# Patient Record
Sex: Male | Born: 1945
Health system: Southern US, Community
[De-identification: ages and names within clinical notes are randomized; demographics above are authoritative.]

## PROBLEM LIST (undated history)

## (undated) DIAGNOSIS — E119 Type 2 diabetes mellitus without complications: Secondary | ICD-10-CM

## (undated) DIAGNOSIS — N6310 Unspecified lump in the right breast, unspecified quadrant: Secondary | ICD-10-CM

## (undated) DIAGNOSIS — C499 Malignant neoplasm of connective and soft tissue, unspecified: Secondary | ICD-10-CM

## (undated) DIAGNOSIS — R918 Other nonspecific abnormal finding of lung field: Secondary | ICD-10-CM

## (undated) DIAGNOSIS — I2699 Other pulmonary embolism without acute cor pulmonale: Secondary | ICD-10-CM

## (undated) DIAGNOSIS — E278 Other specified disorders of adrenal gland: Secondary | ICD-10-CM

## (undated) DIAGNOSIS — I10 Essential (primary) hypertension: Secondary | ICD-10-CM

## (undated) DIAGNOSIS — C3411 Malignant neoplasm of upper lobe, right bronchus or lung: Secondary | ICD-10-CM

## (undated) HISTORY — DX: Malignant neoplasm of upper lobe, right bronchus or lung: C34.11

## (undated) HISTORY — DX: Other pulmonary embolism without acute cor pulmonale: I26.99

## (undated) HISTORY — DX: Type 2 diabetes mellitus without complications: E11.9

## (undated) HISTORY — DX: Malignant neoplasm of connective and soft tissue, unspecified: C49.9

---

## 2003-10-01 ENCOUNTER — Inpatient Hospital Stay (HOSPITAL_COMMUNITY): Admission: EM | Admit: 2003-10-01 | Discharge: 2003-11-01 | Payer: Self-pay | Admitting: Emergency Medicine

## 2003-10-04 ENCOUNTER — Encounter: Payer: Self-pay | Admitting: Critical Care Medicine

## 2003-10-07 ENCOUNTER — Encounter: Payer: Self-pay | Admitting: Cardiology

## 2009-05-04 ENCOUNTER — Ambulatory Visit (HOSPITAL_COMMUNITY): Admission: RE | Admit: 2009-05-04 | Discharge: 2009-05-04 | Payer: Self-pay | Admitting: Internal Medicine

## 2010-09-21 NOTE — H&P (Signed)
Darrell Christensen, Darrell Christensen                           ACCOUNT NO.:  [id number]   MEDICAL RECORD NO.:  [id number]                   PATIENT TYPE:  INP   LOCATION:  2106                                 FACILITY:  [facility]   PHYSICIAN:  Shan Levans, M.D. [facility]            DATE OF BIRTH:  Sep 12, 1945   DATE OF ADMISSION:  10/03/2003  DATE OF DISCHARGE:                                HISTORY & PHYSICAL   CHIEF COMPLAINT:  Altered mental status.   HISTORY OF PRESENT ILLNESS:  This 65 year old white male admitted to [facility] on Oct 01, 2003, with complaints of grand mal seizure after  recent binge drinking, associated seizure disorder, had stopped his seizure  medications and then began binge drinking alcohol as the alcohol level fell.  He began developing shaking, jerking, and then gran mal seizure. He also had  an associated sodium of 110 on admission. He has been slow to wake up over  the past 24 hours and transferred down to [facility] for potential  neurologic evaluation per family request. Upon arrival, however, to our ICU  he was already waking up, moving all fours, and following commands. MRI  earlier today was normal except for motion artifact.   PAST MEDICAL HISTORY:  1. Extensive ethanol abuse.  2. History of seizure disorder.  3. Hypertension.   ALLERGIES:  None.   MEDICATIONS:  Prior to admission include Benicar. Medications currently are  Dilantin 300 mg daily, Protonix IV, thiamine IM, and Cardizem CD 240 mg  daily.   ALLERGIES:  None.   SOCIAL HISTORY:  Noncontributory.   FAMILY HISTORY/REVIEW OF SYSTEMS:  Noncontributory. Medical--previous  history of hypertension and sleep disorder along with seizure disorder.   PHYSICAL EXAMINATION:  GENERAL: This is a semiconscious white male in no  acute distress. This is a slightly agitated white male in no acute distress.  He will follow commands.  VITAL SIGNS: Pulse 106, sinus tachycardia, blood pressure 136/71,  saturation  94% on nasal cannula.  CHEST: Clear. Sonous respirations.  CARDIAC: Resting tachycardia without S3. Normal S1 and S2.  ABDOMEN: Soft, nontender. Bowel sounds active.  EXTREMITIES: No edema or clubbing.  SKIN: Clear.  HEENT: Dry oral mucous membranes and oral secretions seen. Nares clear.  NECK: Supple. No thyromegaly.  NEUROLOGIC: Moves all fours. Will follow commands. Is lethargic. No deficits  seen.  MUSCULOSKELETAL: Decreased range of motion in left shoulder and tender to  palpation.  SKIN: Clear.   LABORATORY DATA:  Chest x-ray showed no active disease. Sodium 127,  potassium 3.5, chloride 96, CO2 23, BUN 3, creatinine 0.8, blood sugar 113,  SGOT 30, SGPT 25, alkaline phosphatase 70, total bilirubin 1.2. BNP 62.  Ammonia level less than 8.  Dilantin level 9.9.   MRI was negative except for motion artifact.   IMPRESSION/RECOMMENDATION:  1. Alcoholic encephalopathy with recent seizure disorder exacerbated by     discontinuance  of seizure medications and falling alcohol levels after     binge drinking, exacerbated by hyponatremia. For this will continue IV     fluids with normal saline and potassium supplementation. Follow     electrolytes, magnesium, and phosphorus. Watch for withdrawal symptoms     from DTs. Give p.r.n. Ativan if needed.  2. Seizure disorder. Will continue Dilantin therapy. Hold off on neurologic     consultation as of now unless the patient has a worsening of his status.  3. Prophylactic therapy includes Lovenox and  proton pump inhibitor.  4. Dislocated left shoulder. Will seek an orthopedic consult.   At this point the patient will be monitored overnight in the intensive care  unit. Plan to transfer to a regular room in the a.m. if stable and consider  a hospitalist consultation for the remainder of this portion of the  hospitalization.                                                Shan Levans, M.D. [facility]    PW/MEDQ  D:  10/03/2003  T:   10/03/2003  Job:  [id number]   cc:   Patrica Duel, M.D.  [address], [address]  [place]  [place] [address]  Fax: [phone]   Kirk Ruths, M.D.  [address]  [place]  [place] [address]  Fax: [phone]

## 2010-09-21 NOTE — H&P (Signed)
NAMEOVERTON, BOGGUS                           ACCOUNT NO.:  [id number]   MEDICAL RECORD NO.:  [id number]                   PATIENT TYPE:  INP   LOCATION:  IC10                                 FACILITY:  [facility]   PHYSICIAN:  Kirk Ruths, M.D.            DATE OF BIRTH:  1945-11-01   DATE OF ADMISSION:  10/01/2003  DATE OF DISCHARGE:                                HISTORY & PHYSICAL   CHIEF COMPLAINT:  Shaking all over.   HISTORY OF PRESENT ILLNESS:  This is a 65 year old white male with a  longstanding history of alcohol use.  Observers state the patient has had  some vomiting over the last day or two and was observed having a grand mal  seizure.  In the emergency room he was also observed to have a grand mal  seizure and was treated with IV Dilantin and Valium.  On workup in the  emergency room the patient was found to have a sodium of 110 and an alcohol  level of 0.64.  It is felt the patient's seizure activity is more related to  hyponatremia rather than alcohol withdrawal.  The patient is admitted to the  ICU for Dilantin and replenishment of his electrolytes, close observation  and anticipation of DTs.   ALLERGIES:  The patient has no known allergies.   MEDICATIONS:  He currently takes Benicar for hypertension.   PAST MEDICAL HISTORY:  There is a remote history of seizures, alcohol abuse  and sleep apnea.   REVIEW OF SYSTEMS:  Unable to obtain due to the patient's obtunded condition  after Valium.   PHYSICAL EXAMINATION:  VITAL SIGNS:  He is afebrile, blood pressure 150/80,  pulse 110 and regular, respirations 26 and unlabored.  GENERAL:  A robust-appearing white male who is obtunded secondary to  medications.  HEENT:  Pupils equal to light and accommodation.  Oropharynx is benign.  TMs  are normal.  NECK:  Supple without JVD, bruits or thyromegaly.  LUNGS:  Clear in all areas.  HEART:  Regular sinus rhythm __________ .  ABDOMEN:  Soft and nontender.  EXTREMITIES:   Without clubbing, cyanosis or edema.  NEUROLOGIC:  Grossly intact.   ASSESSMENT:  1. Grand mal seizure, probably secondary to hyponatremia.  2. Hyponatremia with a sodium of 110.  3. History of alcohol abuse and alcohol-related seizures in the past.  4. Hypertension.     ___________________________________________                                         Kirk Ruths, M.D.   WMM/MEDQ  D:  10/02/2003  T:  10/02/2003  Job:  [id number]

## 2010-09-21 NOTE — Discharge Summary (Signed)
NAMEDAVIDSON, PALMIERI                           ACCOUNT NO.:  [id number]   MEDICAL RECORD NO.:  [id number]                   PATIENT TYPE:  INP   LOCATION:  5501                                 FACILITY:  [facility]   PHYSICIAN:  Marcelyn Bruins, M.D. [facility]              DATE OF BIRTH:  Jun 08, 1945   DATE OF ADMISSION:  10/03/2003  DATE OF DISCHARGE:  10/31/2003                                 DISCHARGE SUMMARY   DISCHARGE DIAGNOSES:  1. Ventilator-dependent respiratory failure.  2. Acute renal failure.  3. Left shoulder dislocation.  4. Alcoholic encephalopathy.  5. Depression.   HISTORY OF PRESENT ILLNESS:  Mr. Darrell Christensen is a 65 year old, white male  admitted to [facility] on Oct 01, 2003, with complaints of grand  mal seizure after recent binge drinking associated with seizure disorder.  He had stopped his seizure medication and began binge drinking alcohol. He  developed shaking, jerking and then a grand mal seizure.  He had associated  sodium of 110 on admission.  He has been slow to wake up over 24 hours and  was transferred to [facility] for potential neurological evaluation  per family request.  However, on arrival to the ICU, he was already awake  following commands.  MRI earlier today showed normal except for a motion  artifact.   PAST MEDICAL HISTORY:  1. Extensive ethanol abuse.  2. History of seizure disorder.  3. Hypertension.   ALLERGIES:  No known drug allergies.   MEDICATIONS:  Benicar, Dilantin, Protonix, thiamine and Cardizem.   LABORATORY DATA AND X-RAY FINDINGS:  Magnesium 2.5.  PTT 34.  FIO2 on 30%  CPAP with PEEP of 5, pH 7.35, pCO2 41, pO2 112.  WBC 18.4, hemoglobin 9.0,  hematocrit 26.2, platelets 899.  Sodium 131, potassium 4.3, chloride 96, CO2  28, glucose 102, BUN 88, creatinine reached a peak of 7.7.  On June 25,  creatinine was 5.0.  Phenytoin level 4.5.  Blood cultures showed no growth.  Urine culture showed 100,000 colonies of E.  coli.  Quantitative BAL  demonstrated Staphylococcus aureus, methicillin-sensitive.  C. difficile  negative.   Chest x-ray shows low volume with bibasilar atelectasis with no significant  CHF.  A 2D echocardiogram performed on June 3, demonstrates EF of 55-65%,  mild mitral annular calcification, left atrial size was upper limits of  normal, mild right ventricular hypertrophy, inferior cava was mildly  dilated.  A 12-lead EKG shows normal sinus rhythm with ventricular rate of  99 beats per minute.   PROCEDURES:  1. Oral tracheal intubation from October 06, 2003, through October 20, 2003.  2. Left subclavian central venous catheterization from October 06, 2003, through     October 19, 2003.  3. Right radial arterial line from October 06, 2003, through October 18, 2003.  4. Right internal jugular hemodialysis catheter from October 08, 2003, through     October 30, 2003.   HOSPITAL COURSE:  Problem 1.  VENTILATOR-DEPENDENT RESPIRATORY FAILURE:  He  required oral tracheal intubation on October 06, 2003, and remained intubated to  October 20, 2003.  This was most likely secondary ETOH abuse withdrawal and  seizure disorder.  He did have methicillin-sensitive Staphylococcus tracheal  bronchitis and this was treated with appropriate antibiotics.  He does have  underlying chronic obstructive pulmonary disease and has continued tobacco  and alcohol abuse.  Despite all the problems, he continues to smoke  postextubation.   Problem 2.  ACUTE RENAL FAILURE:  His creatinine was elevated after  admission.  He required a left internal jugular hemodialysis catheter from  June 4, through June 26.  This has been removed and renal failure slowly  resolving.   Problem 3.  LEFT SHOULDER DISLOCATION MOST LIKELY SECONDARY TO FALL:  He was  seen by the orthopedic service, Dr. Jerl Santos, this time and treated with a  sling and symptomatic pain control.   Problem 4.  ALCOHOLIC ENCEPHALOPATHY:  He was evaluated by Dr. Jeanie Sewer  with  recommendation for treatment of depression.   DISCHARGE MEDICATIONS:  1. Protonix 40 mg a day.  2. Thiamine 100 mg a day.  3. Folic acid 1 mg daily.  4. Seroquel 50 mg q.h.s.  5. Ferrous sulfate 300 mg t.i.d.  6. Clonidine 0.1 mg b.i.d.  7. Lasix 80 mg q.d.  8. Benadryl 25 mg q.h.s.  9. Xanax 0.5 mg b.i.d.  10.      Vicodin one to two tablets every six hours p.r.n. pain.   DIET:  As tolerated.   DISPOSITION/CONDITION ON DISCHARGE:  Due to his altered mental status from  chronic ETOH abuse, he is being transferred to nursing home in the interim  before he is ready for discharge home.      Brett Canales Minor, A.C.N.P. [facility]                 Marcelyn Bruins, M.D. [facility]    SM/MEDQ  D:  10/31/2003  T:  10/31/2003  Job:  [id number]   cc:   Antonietta Breach, M.D.  [address]  [place], [place] [address]  Fax: [phone]   Wilber Bihari. Caryn Section, M.D.  [address]  [place]  [place] [address]  Fax: [phone]   Lubertha Basque. Jerl Santos, M.D.  [address]  [place]  [place] [address]  Fax: [phone]

## 2010-09-21 NOTE — Op Note (Signed)
Darrell Christensen, Darrell Christensen                           ACCOUNT NO.:  [id number]   MEDICAL RECORD NO.:  [id number]                   PATIENT TYPE:  INP   LOCATION:                                       FACILITY:  [facility]   PHYSICIAN:  Larina Earthly, M.D.                 DATE OF BIRTH:  [birth year]   DATE OF PROCEDURE:  10/21/2003  DATE OF DISCHARGE:                                 OPERATIVE REPORT   PREOPERATIVE DIAGNOSIS:  End-stage renal disease   POSTOPERATIVE DIAGNOSIS:  End-stage renal disease.   PROCEDURE:  Left internal jugular Diatek catheter.   SURGEON:  Larina Earthly, M.D.   ASSISTANT:  Nurse.   ANESTHESIA:  MAC.   COMPLICATIONS:  None.   DISPOSITION:  To recovery room stable with chest x-ray pending.   PROCEDURE IN DETAIL:  The patient was taken to the operating room and placed  on the table.  The right and left neck were imaged with ultrasound.  The  patient had a patent left IJ, and the right IJ appeared to be patent but  there looked like there may be some thrombus in the internal jugular vein.  The patient was placed in Trendelenburg position and the right and left neck  and chest were prepped and draped in usual sterile fashion.  Using local  anesthesia and a finder needle, the left internal jugular vein was easily  entered and, using the Seldinger technique, a guidewire was passed down to  the level of the right atrium.  The dilator and peel-away sheath were passed  over the guidewire and the dilator and guidewire were removed.  The Diatek  catheter 32 cm was passed down to the level of the right atrium.  The peel-  away sheath was removed.  The catheter was brought to a subcutaneous tunnel  through a separate stab incision.  The catheter was secured to the skin with  a 3-0 nylon stitch and the entry site was closed with 4-0 subcuticular  Vicryl stitch.  A 2-lumen port was attached to the catheter and both lumens  flushed and aspirated easily and were locked with heparin  1000 units per mL.  The catheter was imaged in its entirety and was found to be without any  kinking at the exit site.  Sterile dressing was applied and the patient was  taken to the recovery room in stable condition.                                               Larina Earthly, M.D.    TFE/MEDQ  D:  10/21/2003  T:  10/23/2003  Job:  [id number]

## 2010-09-21 NOTE — Discharge Summary (Signed)
[name], Darrell Christensen                           ACCOUNT NO.:  [id number]   MEDICAL RECORD NO.:  [id number]                   PATIENT TYPE:  INP   LOCATION:  IC10                                 FACILITY:  [facility]   PHYSICIAN:  Kirk Ruths, M.D.            DATE OF BIRTH:  [birth year]   DATE OF ADMISSION:  10/01/2003  DATE OF DISCHARGE:  10/03/2003                                 DISCHARGE SUMMARY   DISCHARGE DIAGNOSES:  1. Seizure.  2. Extreme hyponatremia.  3. Alcohol abuse.  4. Dislocated left shoulder.  5. Hypokalemia.   HOSPITAL COURSE:  This is a 65 year old white male with a history of alcohol  abuse who was admitted through the emergency room after falling and having a  grand mal seizure.  Observers of the patient state he had been vomiting over  the last two days, and subsequently had fallen, and went through mental  status changes.  The patient has had a remote history of seizures, alcohol  abuse and sleep apnea in the past.   LABORATORY DATA:  In the emergency room, he was found to have sodium of 110,  potassium of 310.  It was originally felt these seizures were related to his  extreme hyponatremia.  The patient's alcohol level at the time of admission  was 0.64 so it was felt it was probably not alcoholic withdrawal seizure  since he had alcohol in his blood.  The patient's cardiac enzymes showed a  myoglobin of greater than 500 with MB of 5.9, and troponin negligible.  Myoglobin was felt to be up due to the patient's fall.  His urinalysis was  negative.  He had a BNP this morning of 62.  His Dilantin level the  following day after IV drip was 9.9 near adequate level, therapeutic level.  Initial white count was 13,900, hemoglobin 16.3.  He did have an elevated D-  Dimer at the time of admission of 1.33.  He had a normal chest x-ray.  The  patient's sodium and potassium were gingerly replaced, sodium the second  hospital day being 121, with potassium up to normal at  3.6.  BUN and  creatinine were normal.   The patient continued to be difficult to arouse.  We were awaiting a  neurology consultation, but neurologist was not available at the time.  The  patient was arousable, but still somewhat sluggish.  With the sodium  approaching normal, he should have been more alert.  He was seen in  consultation by orthopedics for his dislocated shoulder.  The patient during  his stay was closely observed in the intensive care unit.  The patient  underwent an MRI of the brain which showed no acute lesions, although it was  poor quality due to the patient's movement.  The patient's vital signs  remained stable throughout his stay.  Blood gasses were adequate although  not excellent with a pH  of 7.41, pCO2 of 34, and an O2 of __________on two  liters.  The patient's family requested the patient be transferred, and we  were basically in agreement with the transfer since we had no other  treatments with the findings we had and un-availableness of neurology.  He  was transferred to by [facility] in stable condition to the service of Dr.  Delford Field, pulmonary intensive care doctor.    ___________________________________________                                         Kirk Ruths, M.D.   WMM/MEDQ  D:  10/18/2003  T:  10/18/2003  Job:  [id number]

## 2011-05-08 ENCOUNTER — Ambulatory Visit (HOSPITAL_COMMUNITY)
Admission: RE | Admit: 2011-05-08 | Discharge: 2011-05-08 | Disposition: A | Discharge status: Home / Self Care | Payer: Medicare Other | Source: Ambulatory Visit | Attending: Internal Medicine | Admitting: Internal Medicine

## 2011-05-08 ENCOUNTER — Other Ambulatory Visit (HOSPITAL_COMMUNITY): Payer: Self-pay | Admitting: Internal Medicine

## 2011-05-08 DIAGNOSIS — F17200 Nicotine dependence, unspecified, uncomplicated: Secondary | ICD-10-CM

## 2011-05-08 DIAGNOSIS — R05 Cough: Secondary | ICD-10-CM | POA: Insufficient documentation

## 2011-05-08 DIAGNOSIS — R059 Cough, unspecified: Secondary | ICD-10-CM | POA: Insufficient documentation

## 2016-02-27 DIAGNOSIS — E1165 Type 2 diabetes mellitus with hyperglycemia: Secondary | ICD-10-CM | POA: Diagnosis not present

## 2016-02-27 DIAGNOSIS — I10 Essential (primary) hypertension: Secondary | ICD-10-CM | POA: Diagnosis not present

## 2016-03-01 DIAGNOSIS — Z6833 Body mass index (BMI) 33.0-33.9, adult: Secondary | ICD-10-CM | POA: Diagnosis not present

## 2016-03-01 DIAGNOSIS — F411 Generalized anxiety disorder: Secondary | ICD-10-CM | POA: Diagnosis not present

## 2016-03-01 DIAGNOSIS — R944 Abnormal results of kidney function studies: Secondary | ICD-10-CM | POA: Diagnosis not present

## 2016-03-01 DIAGNOSIS — D72829 Elevated white blood cell count, unspecified: Secondary | ICD-10-CM | POA: Diagnosis not present

## 2016-03-01 DIAGNOSIS — Z720 Tobacco use: Secondary | ICD-10-CM | POA: Diagnosis not present

## 2016-03-01 DIAGNOSIS — E1165 Type 2 diabetes mellitus with hyperglycemia: Secondary | ICD-10-CM | POA: Diagnosis not present

## 2016-03-01 DIAGNOSIS — I10 Essential (primary) hypertension: Secondary | ICD-10-CM | POA: Diagnosis not present

## 2016-03-01 DIAGNOSIS — E782 Mixed hyperlipidemia: Secondary | ICD-10-CM | POA: Diagnosis not present

## 2016-03-01 DIAGNOSIS — R601 Generalized edema: Secondary | ICD-10-CM | POA: Diagnosis not present

## 2016-03-31 ENCOUNTER — Emergency Department (HOSPITAL_COMMUNITY): Payer: Medicare Other

## 2016-03-31 ENCOUNTER — Encounter (HOSPITAL_COMMUNITY): Payer: Self-pay

## 2016-03-31 ENCOUNTER — Inpatient Hospital Stay (HOSPITAL_COMMUNITY)
Admission: EM | Admit: 2016-03-31 | Discharge: 2016-04-06 | DRG: 175 | Disposition: A | Discharge status: Home / Self Care | Payer: Medicare Other | Attending: Oncology | Admitting: Oncology

## 2016-03-31 DIAGNOSIS — I10 Essential (primary) hypertension: Secondary | ICD-10-CM | POA: Diagnosis not present

## 2016-03-31 DIAGNOSIS — Z85820 Personal history of malignant melanoma of skin: Secondary | ICD-10-CM

## 2016-03-31 DIAGNOSIS — J984 Other disorders of lung: Secondary | ICD-10-CM

## 2016-03-31 DIAGNOSIS — Z6833 Body mass index (BMI) 33.0-33.9, adult: Secondary | ICD-10-CM | POA: Diagnosis not present

## 2016-03-31 DIAGNOSIS — I2699 Other pulmonary embolism without acute cor pulmonale: Secondary | ICD-10-CM

## 2016-03-31 DIAGNOSIS — K760 Fatty (change of) liver, not elsewhere classified: Secondary | ICD-10-CM | POA: Diagnosis present

## 2016-03-31 DIAGNOSIS — N6310 Unspecified lump in the right breast, unspecified quadrant: Secondary | ICD-10-CM | POA: Diagnosis present

## 2016-03-31 DIAGNOSIS — E279 Disorder of adrenal gland, unspecified: Secondary | ICD-10-CM | POA: Diagnosis not present

## 2016-03-31 DIAGNOSIS — Z7984 Long term (current) use of oral hypoglycemic drugs: Secondary | ICD-10-CM

## 2016-03-31 DIAGNOSIS — E278 Other specified disorders of adrenal gland: Secondary | ICD-10-CM | POA: Diagnosis present

## 2016-03-31 DIAGNOSIS — F419 Anxiety disorder, unspecified: Secondary | ICD-10-CM | POA: Diagnosis present

## 2016-03-31 DIAGNOSIS — C499 Malignant neoplasm of connective and soft tissue, unspecified: Secondary | ICD-10-CM

## 2016-03-31 DIAGNOSIS — N63 Unspecified lump in unspecified breast: Secondary | ICD-10-CM

## 2016-03-31 DIAGNOSIS — J188 Other pneumonia, unspecified organism: Secondary | ICD-10-CM | POA: Diagnosis not present

## 2016-03-31 DIAGNOSIS — F17290 Nicotine dependence, other tobacco product, uncomplicated: Secondary | ICD-10-CM | POA: Diagnosis present

## 2016-03-31 DIAGNOSIS — J95811 Postprocedural pneumothorax: Secondary | ICD-10-CM

## 2016-03-31 DIAGNOSIS — C3411 Malignant neoplasm of upper lobe, right bronchus or lung: Secondary | ICD-10-CM | POA: Diagnosis present

## 2016-03-31 DIAGNOSIS — M8580 Other specified disorders of bone density and structure, unspecified site: Secondary | ICD-10-CM | POA: Diagnosis present

## 2016-03-31 DIAGNOSIS — R911 Solitary pulmonary nodule: Secondary | ICD-10-CM | POA: Diagnosis not present

## 2016-03-31 DIAGNOSIS — R918 Other nonspecific abnormal finding of lung field: Secondary | ICD-10-CM

## 2016-03-31 DIAGNOSIS — D492 Neoplasm of unspecified behavior of bone, soft tissue, and skin: Secondary | ICD-10-CM | POA: Diagnosis not present

## 2016-03-31 DIAGNOSIS — Z87891 Personal history of nicotine dependence: Secondary | ICD-10-CM | POA: Diagnosis not present

## 2016-03-31 DIAGNOSIS — R079 Chest pain, unspecified: Secondary | ICD-10-CM | POA: Diagnosis not present

## 2016-03-31 DIAGNOSIS — R222 Localized swelling, mass and lump, trunk: Secondary | ICD-10-CM | POA: Diagnosis not present

## 2016-03-31 DIAGNOSIS — E669 Obesity, unspecified: Secondary | ICD-10-CM | POA: Diagnosis present

## 2016-03-31 DIAGNOSIS — E785 Hyperlipidemia, unspecified: Secondary | ICD-10-CM | POA: Diagnosis present

## 2016-03-31 DIAGNOSIS — I700 Atherosclerosis of aorta: Secondary | ICD-10-CM | POA: Diagnosis present

## 2016-03-31 DIAGNOSIS — E119 Type 2 diabetes mellitus without complications: Secondary | ICD-10-CM | POA: Diagnosis present

## 2016-03-31 DIAGNOSIS — Z79899 Other long term (current) drug therapy: Secondary | ICD-10-CM | POA: Diagnosis not present

## 2016-03-31 DIAGNOSIS — F1011 Alcohol abuse, in remission: Secondary | ICD-10-CM | POA: Diagnosis present

## 2016-03-31 DIAGNOSIS — J189 Pneumonia, unspecified organism: Secondary | ICD-10-CM | POA: Diagnosis not present

## 2016-03-31 DIAGNOSIS — Z9889 Other specified postprocedural states: Secondary | ICD-10-CM | POA: Diagnosis not present

## 2016-03-31 DIAGNOSIS — Z801 Family history of malignant neoplasm of trachea, bronchus and lung: Secondary | ICD-10-CM | POA: Diagnosis not present

## 2016-03-31 DIAGNOSIS — R928 Other abnormal and inconclusive findings on diagnostic imaging of breast: Secondary | ICD-10-CM | POA: Diagnosis not present

## 2016-03-31 DIAGNOSIS — C50921 Malignant neoplasm of unspecified site of right male breast: Secondary | ICD-10-CM | POA: Diagnosis not present

## 2016-03-31 DIAGNOSIS — C801 Malignant (primary) neoplasm, unspecified: Secondary | ICD-10-CM

## 2016-03-31 HISTORY — DX: Unspecified lump in the right breast, unspecified quadrant: N63.10

## 2016-03-31 HISTORY — DX: Type 2 diabetes mellitus without complications: E11.9

## 2016-03-31 HISTORY — DX: Essential (primary) hypertension: I10

## 2016-03-31 HISTORY — DX: Other pulmonary embolism without acute cor pulmonale: I26.99

## 2016-03-31 HISTORY — DX: Other specified disorders of adrenal gland: E27.8

## 2016-03-31 HISTORY — DX: Other nonspecific abnormal finding of lung field: R91.8

## 2016-03-31 LAB — URINALYSIS, ROUTINE W REFLEX MICROSCOPIC
Bilirubin Urine: NEGATIVE
Glucose, UA: NEGATIVE mg/dL
Hgb urine dipstick: NEGATIVE
Ketones, ur: NEGATIVE mg/dL
Leukocytes, UA: NEGATIVE
Nitrite: NEGATIVE
Protein, ur: NEGATIVE mg/dL
Specific Gravity, Urine: 1.028 (ref 1.005–1.030)
pH: 6 (ref 5.0–8.0)

## 2016-03-31 LAB — CBC
HCT: 43.3 % (ref 39.0–52.0)
Hemoglobin: 14.8 g/dL (ref 13.0–17.0)
MCH: 29.5 pg (ref 26.0–34.0)
MCHC: 34.2 g/dL (ref 30.0–36.0)
MCV: 86.4 fL (ref 78.0–100.0)
Platelets: 327 10*3/uL (ref 150–400)
RBC: 5.01 MIL/uL (ref 4.22–5.81)
RDW: 13.4 % (ref 11.5–15.5)
WBC: 16.4 10*3/uL — ABNORMAL HIGH (ref 4.0–10.5)

## 2016-03-31 LAB — HEPATIC FUNCTION PANEL
ALT: 14 U/L — ABNORMAL LOW (ref 17–63)
AST: 13 U/L — ABNORMAL LOW (ref 15–41)
Albumin: 3.7 g/dL (ref 3.5–5.0)
Alkaline Phosphatase: 72 U/L (ref 38–126)
Bilirubin, Direct: 0.2 mg/dL (ref 0.1–0.5)
Indirect Bilirubin: 0.4 mg/dL (ref 0.3–0.9)
Total Bilirubin: 0.6 mg/dL (ref 0.3–1.2)
Total Protein: 6.9 g/dL (ref 6.5–8.1)

## 2016-03-31 LAB — I-STAT TROPONIN, ED: Troponin i, poc: 0.01 ng/mL (ref 0.00–0.08)

## 2016-03-31 LAB — BASIC METABOLIC PANEL
Anion gap: 11 (ref 5–15)
BUN: 18 mg/dL (ref 6–20)
CO2: 25 mmol/L (ref 22–32)
Calcium: 9.1 mg/dL (ref 8.9–10.3)
Chloride: 98 mmol/L — ABNORMAL LOW (ref 101–111)
Creatinine, Ser: 1.46 mg/dL — ABNORMAL HIGH (ref 0.61–1.24)
GFR calc Af Amer: 54 mL/min — ABNORMAL LOW (ref >60)
GFR calc non Af Amer: 47 mL/min — ABNORMAL LOW (ref >60)
Glucose, Bld: 150 mg/dL — ABNORMAL HIGH (ref 65–99)
Potassium: 4.4 mmol/L (ref 3.5–5.1)
Sodium: 134 mmol/L — ABNORMAL LOW (ref 135–145)

## 2016-03-31 LAB — GLUCOSE, CAPILLARY
Glucose-Capillary: 103 mg/dL — ABNORMAL HIGH (ref 65–99)
Glucose-Capillary: 174 mg/dL — ABNORMAL HIGH (ref 65–99)

## 2016-03-31 MED ORDER — ENOXAPARIN SODIUM 100 MG/ML ~~LOC~~ SOLN
100.0000 mg | Freq: Two times a day (BID) | SUBCUTANEOUS | Status: DC
  Administered 2016-03-31 – 2016-04-01 (×2): 100 mg via SUBCUTANEOUS
  Filled 2016-03-31 (×2): qty 1

## 2016-03-31 MED ORDER — ONDANSETRON HCL 4 MG PO TABS: 4.0000 mg | ORAL_TABLET | Freq: Four times a day (QID) | ORAL | Status: DC | PRN

## 2016-03-31 MED ORDER — AMLODIPINE BESYLATE 5 MG PO TABS
5.0000 mg | ORAL_TABLET | Freq: Every day | ORAL | Status: DC
  Administered 2016-03-31 – 2016-04-01 (×2): 5 mg via ORAL
  Filled 2016-03-31 (×2): qty 1

## 2016-03-31 MED ORDER — ACETAMINOPHEN 650 MG RE SUPP: 650.0000 mg | Freq: Four times a day (QID) | RECTAL | Status: DC | PRN

## 2016-03-31 MED ORDER — ALPRAZOLAM 0.5 MG PO TABS
1.0000 mg | ORAL_TABLET | Freq: Every evening | ORAL | Status: DC | PRN
  Administered 2016-04-01 – 2016-04-05 (×6): 1 mg via ORAL
  Filled 2016-03-31 (×7): qty 2

## 2016-03-31 MED ORDER — SODIUM CHLORIDE 0.9 % IV BOLUS (SEPSIS)
500.0000 mL | Freq: Once | INTRAVENOUS | Status: AC
  Administered 2016-03-31: 500 mL via INTRAVENOUS

## 2016-03-31 MED ORDER — SENNA 8.6 MG PO TABS
1.0000 | ORAL_TABLET | Freq: Two times a day (BID) | ORAL | Status: DC
  Administered 2016-04-03 – 2016-04-06 (×3): 8.6 mg via ORAL
  Filled 2016-03-31 (×9): qty 1

## 2016-03-31 MED ORDER — AZITHROMYCIN 500 MG PO TABS
500.0000 mg | ORAL_TABLET | Freq: Every day | ORAL | Status: AC
  Administered 2016-03-31 – 2016-04-02 (×3): 500 mg via ORAL
  Filled 2016-03-31 (×3): qty 1

## 2016-03-31 MED ORDER — ONDANSETRON HCL 4 MG/2ML IJ SOLN: 4.0000 mg | Freq: Four times a day (QID) | INTRAMUSCULAR | Status: DC | PRN

## 2016-03-31 MED ORDER — DOCUSATE SODIUM 100 MG PO CAPS
100.0000 mg | ORAL_CAPSULE | Freq: Two times a day (BID) | ORAL | Status: DC
  Administered 2016-04-06: 100 mg via ORAL
  Filled 2016-03-31 (×7): qty 1

## 2016-03-31 MED ORDER — INSULIN ASPART 100 UNIT/ML ~~LOC~~ SOLN
0.0000 [IU] | Freq: Three times a day (TID) | SUBCUTANEOUS | Status: DC
  Administered 2016-04-01: 3 [IU] via SUBCUTANEOUS
  Administered 2016-04-02 – 2016-04-03 (×2): 2 [IU] via SUBCUTANEOUS
  Administered 2016-04-03: 3 [IU] via SUBCUTANEOUS
  Administered 2016-04-05 (×2): 2 [IU] via SUBCUTANEOUS

## 2016-03-31 MED ORDER — OXYCODONE HCL 5 MG PO TABS
5.0000 mg | ORAL_TABLET | ORAL | Status: DC | PRN
  Administered 2016-04-01 (×2): 5 mg via ORAL
  Filled 2016-03-31 (×3): qty 1

## 2016-03-31 MED ORDER — SODIUM CHLORIDE 0.9% FLUSH
3.0000 mL | Freq: Two times a day (BID) | INTRAVENOUS | Status: DC
Start: 2016-03-31 — End: 2016-04-06
  Administered 2016-03-31 – 2016-04-06 (×9): 3 mL via INTRAVENOUS

## 2016-03-31 MED ORDER — IOPAMIDOL (ISOVUE-370) INJECTION 76%
INTRAVENOUS | Status: AC
  Administered 2016-03-31: 100 mL
  Filled 2016-03-31: qty 100

## 2016-03-31 MED ORDER — DEXTROSE 5 % IV SOLN
2.0000 g | INTRAVENOUS | Status: DC
  Administered 2016-03-31 – 2016-04-02 (×3): 2 g via INTRAVENOUS
  Filled 2016-03-31 (×4): qty 2

## 2016-03-31 MED ORDER — ACETAMINOPHEN 325 MG PO TABS
650.0000 mg | ORAL_TABLET | Freq: Four times a day (QID) | ORAL | Status: DC | PRN
  Administered 2016-04-02 – 2016-04-05 (×6): 650 mg via ORAL
  Filled 2016-03-31 (×6): qty 2

## 2016-03-31 NOTE — Progress Notes (Addendum)
Pharmacy note: ceftriaxone  70 yo male with suspected PNA. Pharmacy consulted to dose ceftriaxone (patient noted on azithromycin po) -WBC= 16.4, afebrile   Plan -Ceftriaxone 2gm IV q24hr -Will sign off. Please contact pharmacy with any other needs.  Thank you,  Hildred Laser, Pharm D 03/31/2016 6:29 PM

## 2016-03-31 NOTE — ED Provider Notes (Signed)
[facility] DEPT Provider Note   CSN: [id number] Arrival date & time: 03/31/16  1052     History   Chief Complaint Chief Complaint  Patient presents with  . Chest Pain    HPI Darrell Christensen is a 70 y.o. male.  Darrell Christensen is a 70 y.o. Male with history of hypertension, hyperlipidemia and diabetes who presents to the emergency department complaining of pain between his shoulder blades intermittently for the past several months that worsened about 3 days ago. He tells me worsened after using a leaf blower. He has a long history of smoking. He quit about one year ago and now uses a vaporizer.    No chest pain or shortness of breath. He does note he had some pain to his right side of his chest yesterday that resolved after eating. He denies any increased cough or shortness of breath. No treatments prior to arrival. He denies fevers, increased cough, wheezing, shortness of breath, abdominal pain, nausea, vomiting, numbness, tingling, weakness, leg pain, leg swelling, or rashes.    The history is provided by the patient. No language interpreter was used.  Chest Pain   Associated symptoms include back pain and cough (chronic ). Pertinent negatives include no abdominal pain, no dizziness, no fever, no headaches, no nausea, no palpitations, no shortness of breath, no vomiting and no weakness.  Pertinent negatives for past medical history include no seizures.    Past Medical History:  Diagnosis Date  . Diabetes mellitus without complication ([place])   . Hypertension     Patient Active Problem List   Diagnosis Date Noted  . Pulmonary embolism ([place]) 03/31/2016    History reviewed. No pertinent surgical history.     Home Medications    Prior to Admission medications   Medication Sig Start Date End Date Taking? Authorizing Provider  ALPRAZolam [name]) 1 MG tablet Take 1 mg by mouth 3 (three) times daily as needed for anxiety.   Yes Historical Provider, MD  amLODipine (NORVASC) 2.5  MG tablet Take 2.5 mg by mouth daily. 03/22/16  Yes Historical Provider, MD  GLIPIZIDE XL 5 MG 24 hr tablet Take 5 mg by mouth daily. 03/22/16  Yes Historical Provider, MD  metFORMIN (GLUCOPHAGE) 500 MG tablet Take 500 mg by mouth 2 (two) times daily. 03/07/16  Yes Historical Provider, MD  metoprolol succinate (TOPROL-XL) 50 MG 24 hr tablet Take 50 mg by mouth every evening. 03/07/16  Yes Historical Provider, MD  simvastatin (ZOCOR) 20 MG tablet Take 20 mg by mouth every evening. 03/07/16  Yes Historical Provider, MD  telmisartan-hydrochlorothiazide (MICARDIS HCT) 80-25 MG tablet Take 1 tablet by mouth daily. 03/07/16  Yes Historical Provider, MD    Family History No family history on file.  Social History Social History  Substance Use Topics  . Smoking status: Former Research scientist (life sciences)  . Smokeless tobacco: Never Used  . Alcohol use Not on file     Allergies   Patient has no known allergies.   Review of Systems Review of Systems  Constitutional: Negative for chills and fever.  HENT: Negative for congestion and sore throat.   Eyes: Negative for visual disturbance.  Respiratory: Positive for cough (chronic ). Negative for shortness of breath and wheezing.   Cardiovascular: Positive for chest pain. Negative for palpitations and leg swelling.  Gastrointestinal: Negative for abdominal pain, nausea and vomiting.  Genitourinary: Negative for dysuria.  Musculoskeletal: Positive for arthralgias and back pain. Negative for neck pain.  Skin: Negative for rash.  Neurological:  Negative for dizziness, seizures, weakness, light-headedness and headaches.     Physical Exam Updated Vital Signs BP 134/81   Pulse 84   Temp 98.6 F (37 C) (Oral)   Resp (!) 29   SpO2 95%   Physical Exam  Constitutional: He is oriented to person, place, and time. He appears well-developed and well-nourished. No distress.  Nontoxic appearing.  HENT:  Head: Normocephalic and atraumatic.  Mouth/Throat: Oropharynx is  clear and moist.  Eyes: Conjunctivae are normal. Pupils are equal, round, and reactive to light. Right eye exhibits no discharge. Left eye exhibits no discharge.  Neck: Neck supple. No JVD present. No tracheal deviation present.  Cardiovascular: Normal rate, regular rhythm, normal heart sounds and intact distal pulses.  Exam reveals no gallop and no friction rub.   No murmur heard. Bilateral radial and dorsalis pedis pulses are intact.  Pulmonary/Chest: Effort normal and breath sounds normal. No stridor. No respiratory distress. He has no wheezes. He has no rales.  Lungs are clear to auscultation bilaterally. No increased work of breathing. No rales or rhonchi.  Abdominal: Soft. There is no tenderness.  Musculoskeletal: He exhibits no edema or tenderness.  No lower extremity edema or tenderness.  Lymphadenopathy:    He has no cervical adenopathy.  Neurological: He is alert and oriented to person, place, and time. Coordination normal.  Skin: Skin is warm and dry. Capillary refill takes less than 2 seconds. No rash noted. He is not diaphoretic. No erythema. No pallor.  Psychiatric: He has a normal mood and affect. His behavior is normal.  Nursing note and vitals reviewed.    ED Treatments / Results  Labs (all labs ordered are listed, but only abnormal results are displayed) Labs Reviewed  BASIC METABOLIC PANEL - Abnormal; Notable for the following:       Result Value   Sodium 134 (*)    Chloride 98 (*)    Glucose, Bld 150 (*)    Creatinine, Ser 1.46 (*)    GFR calc non Af Amer 47 (*)    GFR calc Af Amer 54 (*)    All other components within normal limits  CBC - Abnormal; Notable for the following:    WBC 16.4 (*)    All other components within normal limits  HEPATIC FUNCTION PANEL  I-STAT TROPOININ, ED    EKG  EKG Interpretation None       Radiology Dg Chest 2 View  Result Date: 03/31/2016 CLINICAL DATA:  Right-sided chest pain EXAM: CHEST  2 VIEW COMPARISON:   05/08/2011 FINDINGS: Cardiac shadow is mildly enlarged. There is increased density projecting in the superior segment of the right lower lobe consistent with pneumonia. Fullness in the hilar region is noted as well as in the right peritracheal region. No other focal abnormality is seen. No bony abnormality is noted. IMPRESSION: Changes in the superior segment of the right lower lobe likely related to acute pneumonia and reactive lymphadenopathy. Followup PA and lateral chest X-ray is recommended in 3-4 weeks following trial of antibiotic therapy to ensure resolution and exclude underlying malignancy. Electronically Signed   By: Inez Catalina M.D.   On: 03/31/2016 11:39   Ct Angio Chest Pe W/cm &/or Wo Cm  Result Date: 03/31/2016 CLINICAL DATA:  Patient with intermittent chest pain. Consolidation within the right lung on recent chest radiograph. EXAM: CT ANGIOGRAPHY CHEST WITH CONTRAST TECHNIQUE: Multidetector CT imaging of the chest was performed using the standard protocol during bolus administration of intravenous contrast. Multiplanar CT  image reconstructions and MIPs were obtained to evaluate the vascular anatomy. CONTRAST:  100 cc Isovue 370 COMPARISON:  Chest radiograph 03/31/2016; 05/08/2011. FINDINGS: Cardiovascular: Normal heart size. No pericardial effusion. Coronary arterial vascular calcifications. Aorta and main pulmonary artery are normal in caliber. The right upper lobe pulmonary arteries are attenuated coursing over the superior aspect of the large right upper lobe area of consolidation. Within a right middle lobe pulmonary artery (image 145; series 607) there is a thin central filling defect. Mediastinum/Nodes: No axillary lymphadenopathy. There is a 2.6 cm right hilar lymph node (image 51; series 601). There is a 1.9 cm right peritracheal lymph node (image 40; series 601). Multiple prominent sub cm right peritracheal lymph nodes are demonstrated. Normal appearance of the esophagus.  Lungs/Pleura: Expiratory phase imaging. 7 mm left upper lobe pulmonary nodule (image 54; series 606). Dependent atelectasis within the lower lobes bilaterally. 5 mm subpleural right upper lobe nodule (image 55; series 606). Two adjacent large masslike areas of consolidation within the right upper lobe measuring 5.4 x 5.6 cm (image 101; series 607) and 5.1 x 4.9 cm (image 136; series 607). Few pleural based calcifications within the left hemi thorax. Upper Abdomen: Liver is diffusely low in attenuation. Indeterminate 2.9 x 1.7 cm left adrenal nodule. Normal morphology of the stomach. Musculoskeletal: Healed posterior right twelfth rib fracture. Thoracic spine degenerative changes. No aggressive or acute appearing osseous lesions. Marked degenerative changes involving the right shoulder joint. There is a 1.8 cm solid nodule within the right breast (image 62; series 601). Indeterminate 1.1 cm low-attenuation nodule within the right thyroid lobe. Review of the MIP images confirms the above findings. IMPRESSION: Small central filling defect within a right middle lobe pulmonary artery concerning for pulmonary embolism. Two adjacent large masslike areas of consolidation within the right upper lobe with differential considerations including malignancy or pneumonia. Multiple enlarged right hilar and mediastinal lymph nodes which may be reactive or metastatic in etiology. Additional indeterminate pulmonary nodules as above. Recommend attention on follow-up. Indeterminate 2.9 cm left adrenal nodule. This needs dedicated evaluation with pre and post contrast-enhanced CT or MRI after confirmation of pulmonary process. Indeterminate nodule within the right breast. Recommend dedicated evaluation with mammography. Hepatic steatosis. Critical Value/emergent results were called by telephone at the time of interpretation on 03/31/2016 at 2:49 pm to Dr. Lita Mains , who verbally acknowledged these results. Electronically Signed   By:  Lovey Newcomer M.D.   On: 03/31/2016 14:54    Procedures Procedures (including critical care time)  Medications Ordered in ED Medications  sodium chloride 0.9 % bolus 500 mL (0 mLs Intravenous Stopped 03/31/16 1442)  iopamidol (ISOVUE-370) 76 % injection (100 mLs  Contrast Given 03/31/16 1406)     Initial Impression / Assessment and Plan / ED Course  I have reviewed the triage vital signs and the nursing notes.  Pertinent labs & imaging results that were available during my care of the patient were reviewed by me and considered in my medical decision making (see chart for details).  Clinical Course    This is a 70 y.o. Male with history of hypertension, hyperlipidemia and diabetes who presents to the emergency department complaining of pain between his shoulder blades intermittently for the past several months that worsened about 3 days ago. He tells me worsened after using a leaf blower. He has a long history of smoking. He quit about one year ago and now uses a vaporizer.    No chest pain or shortness of breath. He  does note he had some pain to his right side of his chest yesterday that resolved after eating. He denies any increased cough or shortness of breath. On exam patient is afebrile and nontoxic appearing. Lungs are clear to auscultation bilaterally. No increased work of breathing. No wheezing noted. No lower extremity edema or tenderness. EKG without STEMI. No old tracing to compare.  Troponin is not elevated. BMP is remarkable for creatinine of 1.46. No baseline to compare.  CBC is remarkable for white count of 16,000. Chest x-ray shows changes in the right lower lobe likely related to pneumonia. Patient does not have a presentation that is consistent with PNA and after evaluation by Dr. Lita Mains, will obtain CTA chest to rule out PE or mass.   CTA chest shows evidence of a right middle lobe PE. No right heart strain. Also too large masslike areas of consolidation within the right  upper lobe radiology as differential including malignancy or pneumonia. I suspect this is more likely malignancy.  I am more suspicious for malignancy. Will admit to medicine for further work up.  I discussed the results with the patient. He agrees with plan for admission.   I consulted with internal medicine teaching service who accepted the patient for admission. He requested temp orders for tele inpatient.   This patient was discussed with and evaluated by Dr. Lita Mains who agrees with assessment and plan.   Final Clinical Impressions(s) / ED Diagnoses   Final diagnoses:  Other acute pulmonary embolism without acute cor pulmonale (HCC)  Lung mass    New Prescriptions New Prescriptions   No medications on file     Waynetta Pean, PA-C 03/31/16 1544    Julianne Rice, MD 03/31/16 1550

## 2016-03-31 NOTE — H&P (Signed)
Date: 03/31/2016               Patient Name:  Darrell Christensen MRN: [id number]  DOB: 08-May-1945 Age / Sex: 70 y.o., male   PCP: No primary care provider on file.         Medical Service: Internal Medicine Teaching Service         Attending Physician: Dr. Annia Belt, MD    First Contact: Dr. Jari Favre Pager: [phone]  Second Contact: Dr. Tiburcio Pea Pager: [phone]       After Hours (After 5p/  First Contact Pager: [phone]  weekends / holidays): Second Contact Pager: [phone]   Chief Complaint: Chest and back pain  History of Present Illness: Darrell Christensen is a 70yo male with PMH of HTN, T2DM, history of alcohol abuse presenting to the [facility] with intermittent chest pain for a few years and pain between his shoulder blades intermittently for about one month. Patient decided to come in at this time because his friends thought he should get his heart checked out. He has had the chest pain evaluated by PCP in the past apparently with no explanation; he states the pain feels like a gas pain, lasts for about 1 minute and will pass on its own. It is not associated with shortness of breath, nausea, diaphoresis or syncope. The back pain began while he was on a lawn mower, mulching leaves and moving around a lot. The pain is also intermittent, non radiating, with no associated numbness, tingling, LE weakness, or incontinence.   Patient endorses about a one month history of poor appetite (about since his brother passed away), endorses weight gain, no fevers, night sweats, or hemoptysis. He denies headache, vision and hearing changes, abdominal pain, nausea, vomiting, diarrhea or constipation.   Meds:  Current Meds  Medication Sig  . ALPRAZolam (XANAX) 1 MG tablet Take 1 mg by mouth 3 (three) times daily as needed for anxiety.  Marland Kitchen amLODipine (NORVASC) 2.5 MG tablet Take 2.5 mg by mouth daily.  Marland Kitchen GLIPIZIDE XL 5 MG 24 hr tablet Take 5 mg by mouth daily.  . metFORMIN (GLUCOPHAGE) 500 MG tablet Take 500  mg by mouth 2 (two) times daily.  . metoprolol succinate (TOPROL-XL) 50 MG 24 hr tablet Take 50 mg by mouth every evening.  . simvastatin (ZOCOR) 20 MG tablet Take 20 mg by mouth every evening.  Marland Kitchen telmisartan-hydrochlorothiazide (MICARDIS HCT) 80-25 MG tablet Take 1 tablet by mouth daily.     Allergies: Allergies as of 03/31/2016  . (No Known Allergies)   Past Medical History:  Diagnosis Date  . Diabetes mellitus without complication ([place])   . Hypertension     Family History: Lung cancer in father and brother  Social History: divorced x3; one child lives in [place]; half-brother lives in [place]. Former tobacco use >50 pack years - quit one year ago, still vapes. Former alcohol abuse - quit in 2005. Denies current or prior drug use.  Review of Systems: A complete ROS was negative except as per HPI.   Physical Exam: Blood pressure 120/75, pulse 94, temperature 98.6 F (37 C), temperature source Oral, resp. rate 16, SpO2 94 %. Constitutional: NAD, lying in bed comfortably, vs reviewed Eyes: EOMI, non injected, no scleral icterus ENT: missing dentition, no erythema or exudates appreciated, no lymphadenopathy CV: tachycardic, regular rhythm, no murmurs, rubs or gallops. Distant heart sounds Resp: no increased work of breathing, moving air well through all lung regions, right middle lung with  crackles, no wheezing Abd: distended, soft, +BS, umbilical hernia, no organomegaly appreciated, nontender MSK: moves all 4 extremities freely, strength intact, tenderness over thoracic spine and paraspinal musculature - hard to differentiate Neuro: sensation intact, strength intact, CN 2-12 grossly normal Psych: mood and affect appropriate  EKG: I personally reviewed the EKG - sinus rhythm, no ST segment or T wave inversions   CXR: I personally reviewed the CXR - right-sided infiltrate  CTA:  -Small central filling defect within a right middle lobe pulmonary artery concerning for  pulmonary embolism. -Two adjacent large masslike areas of consolidation within the right upper lobe with differential considerations including malignancy or pneumonia. Multiple enlarged right hilar and mediastinal lymph nodes which may be reactive or metastatic in etiology. -Additional indeterminate pulmonary nodules as above. Recommend attention on follow-up. -Indeterminate 2.9 cm left adrenal nodule. This needs dedicated evaluation with pre and post contrast-enhanced CT or MRI after confirmation of pulmonary process. -Indeterminate nodule within the right breast. Recommend dedicated evaluation with mammography. -Hepatic steatosis  Assessment & Plan by Problem: Active Problems:   Pulmonary embolism ([place])  Pulmonary embolism likely associated with malignancy: Patient with thoracic back pain for about 1 month as well as intermittent right lower chest pain for years, not associated with shortness of breath or hemoptysis. Patient is tachycardic and satting in mid 90's without oxygen requirement. CTA consistent with pulmonary embolus; there was also evidence of infiltrate that could be pneumonia vs malignancy. With that in mind, the recommendation is to treat PE with LMWH.  --Therapeutic lovenox per pharmacy  Malignancy of unknown origin: Patient with new finding of possible malignancy in R lung, with nodules on adrenal gland, and right breast as well - likely source is lung as patient is long time smoker. --hepatic panel to evaluate liver function for possible mets, but imagining does not support this.  --alk phos to evaluate hepatic and bone involvement; f/u with GGT if elevated --consider CT abdomen and right breast u/s for other lesions.  --consider pulm consult in AM for possible biopsy  CAP: Patient with right sided infiltrate on CXR and CTA that is consistent with pneumonia vs malignant infiltrate. Will start treatment for CAP in setting of age and possible post-obstructive nature of  the pneumonia.  --Ceftriaxone + azithromycin  HTN: Patient with history of hypertension managed with amlodipine 2.32m daily, metoprolol succinate 575mdaily, and telmisartan-HCTZ 80-2534maily. --continue home regiment  T2DM: Patient with T2DM on glipizide 5mg2mily and metformin 500mg2m. --Hold oral meds --SSI-S  HLD: Patient with hyperlipidemia on simvastain 20mg 74my.  --continue home regimem  Dispo: Admit patient to Inpatient with expected length of stay greater than 2 midnights.  Signed: GoricaAlphonzo Grieve1/26/2017, 5:15 PM  Pager [phone]

## 2016-03-31 NOTE — Progress Notes (Signed)
Patient has arrived on unit from ED.  Patient oriented to unit, assessed, placed on tele, VS were stable.

## 2016-03-31 NOTE — Progress Notes (Signed)
MD. Tarry Kos

## 2016-03-31 NOTE — ED Triage Notes (Signed)
Patient complains of intermittent chest pain the past year. States that the past 2 days the pain has been worse and more pain to scapula. Has seen MD for same in past and no diagnosis. NAD

## 2016-04-01 ENCOUNTER — Encounter (HOSPITAL_COMMUNITY): Payer: Self-pay | Admitting: Oncology

## 2016-04-01 DIAGNOSIS — N63 Unspecified lump in unspecified breast: Secondary | ICD-10-CM | POA: Diagnosis present

## 2016-04-01 DIAGNOSIS — I10 Essential (primary) hypertension: Secondary | ICD-10-CM

## 2016-04-01 DIAGNOSIS — C3411 Malignant neoplasm of upper lobe, right bronchus or lung: Secondary | ICD-10-CM | POA: Diagnosis present

## 2016-04-01 DIAGNOSIS — N6310 Unspecified lump in the right breast, unspecified quadrant: Secondary | ICD-10-CM

## 2016-04-01 DIAGNOSIS — R918 Other nonspecific abnormal finding of lung field: Secondary | ICD-10-CM

## 2016-04-01 DIAGNOSIS — E278 Other specified disorders of adrenal gland: Secondary | ICD-10-CM

## 2016-04-01 HISTORY — DX: Other nonspecific abnormal finding of lung field: R91.8

## 2016-04-01 HISTORY — DX: Other specified disorders of adrenal gland: E27.8

## 2016-04-01 HISTORY — DX: Unspecified lump in the right breast, unspecified quadrant: N63.10

## 2016-04-01 HISTORY — DX: Malignant neoplasm of upper lobe, right bronchus or lung: C34.11

## 2016-04-01 LAB — COMPREHENSIVE METABOLIC PANEL
ALT: 14 U/L — ABNORMAL LOW (ref 17–63)
AST: 12 U/L — ABNORMAL LOW (ref 15–41)
Albumin: 3.3 g/dL — ABNORMAL LOW (ref 3.5–5.0)
Alkaline Phosphatase: 64 U/L (ref 38–126)
Anion gap: 10 (ref 5–15)
BUN: 16 mg/dL (ref 6–20)
CO2: 25 mmol/L (ref 22–32)
Calcium: 8.8 mg/dL — ABNORMAL LOW (ref 8.9–10.3)
Chloride: 103 mmol/L (ref 101–111)
Creatinine, Ser: 1.39 mg/dL — ABNORMAL HIGH (ref 0.61–1.24)
GFR calc Af Amer: 57 mL/min — ABNORMAL LOW (ref >60)
GFR calc non Af Amer: 49 mL/min — ABNORMAL LOW (ref >60)
Glucose, Bld: 98 mg/dL (ref 65–99)
Potassium: 4.1 mmol/L (ref 3.5–5.1)
Sodium: 138 mmol/L (ref 135–145)
Total Bilirubin: 0.5 mg/dL (ref 0.3–1.2)
Total Protein: 6.2 g/dL — ABNORMAL LOW (ref 6.5–8.1)

## 2016-04-01 LAB — PROTIME-INR
INR: 1.13
Prothrombin Time: 14.6 seconds (ref 11.4–15.2)

## 2016-04-01 LAB — CBC
HCT: 40.5 % (ref 39.0–52.0)
Hemoglobin: 13.6 g/dL (ref 13.0–17.0)
MCH: 29 pg (ref 26.0–34.0)
MCHC: 33.6 g/dL (ref 30.0–36.0)
MCV: 86.4 fL (ref 78.0–100.0)
Platelets: 302 10*3/uL (ref 150–400)
RBC: 4.69 MIL/uL (ref 4.22–5.81)
RDW: 13.6 % (ref 11.5–15.5)
WBC: 14.3 10*3/uL — ABNORMAL HIGH (ref 4.0–10.5)

## 2016-04-01 LAB — GLUCOSE, CAPILLARY
Glucose-Capillary: 114 mg/dL — ABNORMAL HIGH (ref 65–99)
Glucose-Capillary: 177 mg/dL — ABNORMAL HIGH (ref 65–99)
Glucose-Capillary: 82 mg/dL (ref 65–99)
Glucose-Capillary: 97 mg/dL (ref 65–99)
Glucose-Capillary: 99 mg/dL (ref 65–99)

## 2016-04-01 MED ORDER — AMLODIPINE BESYLATE 5 MG PO TABS
2.5000 mg | ORAL_TABLET | Freq: Every day | ORAL | Status: DC
  Administered 2016-04-02 – 2016-04-06 (×5): 2.5 mg via ORAL
  Filled 2016-04-01 (×5): qty 1

## 2016-04-01 MED ORDER — SIMVASTATIN 20 MG PO TABS
20.0000 mg | ORAL_TABLET | Freq: Every evening | ORAL | Status: DC
  Administered 2016-04-01 – 2016-04-05 (×5): 20 mg via ORAL
  Filled 2016-04-01 (×5): qty 1

## 2016-04-01 NOTE — Evaluation (Signed)
Physical Therapy Evaluation/Discharge  Patient Details Name: Darrell Christensen MRN: [id number] DOB: [birth year] Today's Date: 04/01/2016   History of Present Illness  70 yo admitted with back pain and found to have lung mets and PE. Pt treated only with lovenox and antibiotic. PMHx: HTN, DM, ETOH abuse  Clinical Impression  Patient did not present with any deficits in ambulation, stair climbing, or bed mobility that would limit him in return to home. Patient was willing to ambulate today, but noted significant back pain between scapula upon arrival. During ambulation, patient maintained O2sat at 90% and reported back pain with twisting, but did not report any shortness of breath. He was able to negotiate stairs without assistance. Patient was educated on how to perform bed mobility consistent with back precautions to decrease twisting. Patient without any further needs, and is aware and agreeable.     Follow Up Recommendations No PT follow up    Equipment Recommendations  None recommended by PT    Recommendations for Other Services       Precautions / Restrictions Precautions Precautions: None      Mobility  Bed Mobility Overal bed mobility: Modified Independent             General bed mobility comments: increased time to perform rolling  Transfers Overall transfer level: Modified independent               General transfer comment: increased time to move from supine to sit, sit to stand  Ambulation/Gait Ambulation/Gait assistance: Independent Ambulation Distance (Feet): 400 Feet Assistive device: None Gait Pattern/deviations: WFL(Within Functional Limits);Step-through pattern   Gait velocity interpretation: Below normal speed for age/gender    Stairs Stairs: Yes Stairs assistance: Modified independent (Device/Increase time) Stair Management: One rail Right;Alternating pattern Number of Stairs: 2    Wheelchair Mobility    Modified Rankin (Stroke Patients  Only)       Balance Overall balance assessment: No apparent balance deficits (not formally assessed)                                           Pertinent Vitals/Pain Pain Assessment: 0-10 Pain Score: 9  Pain Location: back, between shoulder blades    Home Living Family/patient expects to be discharged to:: Private residence Living Arrangements: Alone Available Help at Discharge:  (patient's family/friends work) Type of Home: Mobile home Home Access: Stairs to enter Entrance Stairs-Rails: Can reach both Entrance Stairs-Number of Steps: 2 Home Layout: One level Home Equipment: None      Prior Function Level of Independence: Independent               Hand Dominance        Extremity/Trunk Assessment   Upper Extremity Assessment: Overall WFL for tasks assessed           Lower Extremity Assessment: Overall WFL for tasks assessed         Communication   Communication: No difficulties  Cognition Arousal/Alertness: Awake/alert Behavior During Therapy: WFL for tasks assessed/performed Overall Cognitive Status: Within Functional Limits for tasks assessed                      General Comments      Exercises     Assessment/Plan    PT Assessment Patent does not need any further PT services  PT Problem List  PT Treatment Interventions      PT Goals (Current goals can be found in the Care Plan section)  Acute Rehab PT Goals Patient Stated Goal: return home  PT Goal Formulation: With patient Time For Goal Achievement: 04/15/16 Potential to Achieve Goals: Good    Frequency     Barriers to discharge        Co-evaluation               End of Session   Activity Tolerance: Patient tolerated treatment well;No increased pain Patient left: in chair;with call bell/phone within reach Nurse Communication: Mobility status         Time: 0017-4944 PT Time Calculation (min) (ACUTE ONLY): 19  min   Charges:   PT Evaluation $PT Eval Low Complexity: 1 Procedure     PT G Codes:        Naren Benally 2016/04/19, 9:51 AM  Loras Grieshop SPT [phone]

## 2016-04-01 NOTE — Progress Notes (Signed)
Subjective: Patient reports doing well today with no complaints. The patient states he is confused with his care due to the number of doctors he has talked to. He was reassured that the next step in his care was to evaluate the mass in his lung. He agreed with the plan to biopsy the nodule in his R breast.  Objective: Vital signs in last 24 hours: Vitals:   03/31/16 2017 04/01/16 0417 04/01/16 0600 04/01/16 0822  BP: 113/71 (!) 89/46 (!) 102/58   Pulse: 97 88  (!) 101  Resp: 18 17    Temp: 97.7 F (36.5 C) 97.9 F (36.6 C)    TempSrc: Oral Oral    SpO2: 94% 97%  90%  Weight:  102.1 kg (225 lb 1.4 oz)    Height:       Weight change:   Intake/Output Summary (Last 24 hours) at 04/01/16 1235 Last data filed at 04/01/16 0730  Gross per 24 hour  Intake              790 ml  Output                0 ml  Net              790 ml   Gen: Patient is well appearing, lying in bed, fully conversant with team. HEENT: normocephalic and atraumatic. Pupils are equally round and reactive to light. Moist mucus membranes. Anicteric. No oral lesions present. Tongue is enlarged.   Lymphatics: enlarged R submandibular node. no lymphadenopathy present posterior cervical, posterior auricular, occipital, periauricular, parotid submental. No axillary, supraclavicular lymphadenopathy present.  Cv: No sternal scars present. Regular rate and rhythm. NL s1 s2 with no heaves rubs or murmurs.  Vascular: Radial, dorsalis pedis and posterior tibial pulses 2+.  Resp: No scars present on back. Normal work of breathing. Clear to ascultation bilaterally. Tympanic percussion throughout.  Abdomen: No abdominal scars present. Distended. Normal Bowel sounds. Nontender to palpitation. Normal percussion throughout. No organomegaly or masses.  Neuro: Oriented x3. Normal visual acuity. EOM intact. Facial motor symmetry. Facial sensation is symmetric. Uvula and tongue are midline. Grossly normal strength throughout. Sensation is  symmetric throughout. Finger to nose and heel to shin are normal.  Ext: Warm extremities. No LE edema. No cyanosis or clubbing. No rashes present. MSK: No obvious deformed joints.   Lab Results: CMP was notable for a Cr of 1.4. CBC is notable for an elevated WBC at 14.3 (down from 16.4 yesterday)  Micro Results: No results found for this or any previous visit (from the past 240 hour(s)). Studies/Results:  CXR - Pa, consolidation in medial right upper lope that disrupts the right heart border. This may be due to hilar lymphadenopathy. On lateral, mass in circumventing trachea and pressing against spine.   CTA - Two 50 x 50 mm masses present in the RUL. Right breast mass also present. Adrenal mass also noted.    Medications: I have reviewed the patient's current medications. Scheduled Meds: . [START ON 04/02/2016] amLODipine  2.5 mg Oral Daily  . azithromycin  500 mg Oral Daily  . cefTRIAXone (ROCEPHIN)  IV  2 g Intravenous Q24H  . docusate sodium  100 mg Oral BID  . insulin aspart  0-15 Units Subcutaneous TID WC  . senna  1 tablet Oral BID  . sodium chloride flush  3 mL Intravenous Q12H   Continuous Infusions: PRN Meds:.acetaminophen **OR** acetaminophen, ALPRAZolam, ondansetron **OR** ondansetron (ZOFRAN) IV, oxyCODONE Assessment/Plan: Active Problems:  Pulmonary embolism (HCC)  Darrell Christensen, Darrell Christensen is a 70 yo M with a smoking history of 50 pack years, a family history of lung cancer in his father and brother and a PMH HTN, T2DM and h/o alcohol abuse who present to the ED with intermittent chest pain for years and thoracic back pain for 1 month who was found to have R lung masses.   Possible Malignancy of unknown origin Patient has had thoracic back pain for about 1 month with intermittent right lower chest pain for years. During evaluation in the Ed, CXR was notable for mass in the RUL. CTA was notable for two masses in the RUL measuring 54 x 56 and 51 x 49 mm adjacent to the bronchi and  spine. Other nodes were also present: Hilar node, tracheal node, L upper pulmonary nodule, subpleural nodules and few pleural based calcifications in left hemithorax. Right breast nodule and left adrenal nodule. These nodules are most likely a primary lung malignancy in the setting of a 50 pack year history and a positive family history of lung cancer in father and brother.    - Evaluation of right breast nodule with core needle biopsy to determine next step 24 hrs post anticoag  - If negative, will need to lavage for further evaluation  - Hold LMWH for core needle biospy   - Acetaminophen 650 mg q6 PRN  - oxycodone '5mg'$  q4 PRN  Obstructive Pneumonia - Patient's chest pain, tachycardia and elevated WBC may be consistent with an obstructive pneumonia due to malignancy. Will continue to treat with Azi+Ceftriax to cover for CAP.    - Azithromycin 500 mg q24 PO  - Ceftriaxone 2g IV q24  - cbc qd  - cmp qd  Pulmonary embolism likely associated with malignancy Patient was tachycardic and satting in mid 90's without oxygen requirement. No complaints of SOB or hemoptysis. CTA consistent with pulmonary embolus.   - Will hold LMWH for core needle biopsy   HTN Managed with amlodipine 2.'5mg'$  qd, metoprolol succinate '50mg'$  qd, and telmisartan-HCTZ 80-'25mg'$  qd at home --continue home regiment  Anxiety  Alprazolam 1 mg qhs oral  T2DM: Patient with T2DM on glipizide '5mg'$  daily and metformin '500mg'$  BID.  - Hold oral meds  - SSI-S  HLD: Patient with hyperlipidemia on simvastain '20mg'$  daily.  --continue home regimem  FENGI  - colace 100 mg BID oral  - senna 8.6 mg BID oral  - ondansetron '4mg'$  q6 PRN oral  DVT: hold   CODE  - Full   This is a Careers information officer Note.  The care of the patient was discussed with Dr. Jari Favre and the assessment and plan formulated with their assistance.  Please see their attached note for official documentation of the daily encounter.   LOS: 1 day   Benn Moulder,  Medical Student 04/01/2016, 12:35 PM

## 2016-04-01 NOTE — Evaluation (Signed)
Occupational Therapy Evaluation Patient Details Name: Darrell Christensen MRN: [id number] DOB: [birth year] Today's Date: 04/01/2016    History of Present Illness 70 yo admitted with back pain and found to have lung mets and PE. Pt treated only with lovenox and antibiotic. PMHx: HTN, DM, ETOH abuse   Clinical Impression   Pt currently modified independent to independent for selfcare tasks and functional transfers.  No further OT needs at this time.      Follow Up Recommendations  No OT follow up    Equipment Recommendations  None recommended by OT       Precautions / Restrictions Precautions Precautions: None      Mobility Bed Mobility Overal bed mobility: Modified Independent                Transfers Overall transfer level: Modified independent Equipment used: None                  Balance Overall balance assessment: No apparent balance deficits (not formally assessed)                                          ADL Overall ADL's : Modified independent                                       General ADL Comments: Pt reported some difficulty donning socks and shoes at times but did not exhibit difficulty this session.  Educated pt on posture for greater shoulder alignment and activation.  Instructed on scapular adduction exercises AROM to be completed periodically during the day.       Vision Vision Assessment?: No apparent visual deficits Additional Comments: Pt reports needing glasses changed.    Perception Perception Perception Tested?: No   Praxis Praxis Praxis tested?: Not tested    Pertinent Vitals/Pain Pain Assessment: Faces Pain Score: 4  Pain Location: lower back Pain Descriptors / Indicators: Discomfort Pain Intervention(s): Monitored during session     Hand Dominance Right   Extremity/Trunk Assessment Upper Extremity Assessment Upper Extremity Assessment: LUE deficits/detail LUE Deficits / Details: Pt  demonstrates history of rotator cuff arthropy.  AROM shoulder flexion 0-70 degrees, increased pain with AAROM to 90 degrees,  internal rotation limited with reaching behind his back as well.  Elbow flexion 4/5 as well as gross grasp.    Lower Extremity Assessment Lower Extremity Assessment: Defer to PT evaluation       Communication Communication Communication: No difficulties   Cognition Arousal/Alertness: Awake/alert Behavior During Therapy: WFL for tasks assessed/performed Overall Cognitive Status: Within Functional Limits for tasks assessed                                Home Living Family/patient expects to be discharged to:: Private residence Living Arrangements: Alone Available Help at Discharge:  (patient's family/friends work) Type of Home: Mobile home Home Access: Stairs to enter Technical brewer of Steps: 2 Entrance Stairs-Rails: Can reach both Home Layout: One level     Bathroom Shower/Tub: Tub/shower unit Shower/tub characteristics: Architectural technologist: Handicapped height (with riser)     Home Equipment: Grab bars - toilet;Grab bars - tub/shower          Prior Functioning/Environment Level of Independence: Independent  End of Session    Activity Tolerance: Patient tolerated treatment well Patient left: in bed;with call bell/phone within reach   Time: 1532-1556 OT Time Calculation (min): 24 min Charges:  OT General Charges $OT Visit: 1 Procedure OT Evaluation $OT Eval Moderate Complexity: 1 Procedure  Jame Seelig OTR/L 04/01/2016, 4:15 PM

## 2016-04-01 NOTE — Progress Notes (Signed)
   04/01/16 1210  Clinical Encounter Type  Visited With Patient  Visit Type Other (Comment) ([place] consult)  Referral From Nurse  Consult/Referral To Chaplain  Spiritual Encounters  Spiritual Needs Emotional  Stress Factors  Patient Stress Factors Health changes  Introduction to Pt. Pt. Vented. No other needs at this time.

## 2016-04-01 NOTE — Progress Notes (Signed)
Chief Complaint: Patient was seen in consultation today for biopsy of chest wall mass at the request of Dr. Murriel Hopper  Referring Physician(s): Dr. Murriel Hopper  Supervising Physician: Corrie Mckusick  Patient Status: [facility] - In-pt  History of Present Illness: Darrell Christensen is a 70 y.o. male admitted and found to have RUL mass and small Right PE. He was started on Lovenox. IR is asked to review imaging and help obtain tissue sampling. Chart, imaging, meds, labs, allergies reviewed.  Past Medical History:  Diagnosis Date  . Adrenal mass, left ([place]) 04/01/2016  . Diabetes mellitus without complication ([place])   . Hypertension   . Mass of breast, right 04/01/2016  . Mass of upper lobe of right lung 04/01/2016   2 adjacent, 5 cm masses, hilar adenopathy    History reviewed. No pertinent surgical history.  Allergies: Patient has no known allergies.  Medications:  Current Facility-Administered Medications:  .  acetaminophen (TYLENOL) tablet 650 mg, 650 mg, Oral, Q6H PRN **OR** acetaminophen (TYLENOL) suppository 650 mg, 650 mg, Rectal, Q6H PRN, Burgess Estelle, MD .  ALPRAZolam [name]) tablet 1 mg, 1 mg, Oral, QHS PRN, Burgess Estelle, MD, 1 mg at 04/01/16 0010 .  [START ON 04/02/2016] amLODipine (NORVASC) tablet 2.5 mg, 2.5 mg, Oral, Daily, Burgess Estelle, MD .  azithromycin (ZITHROMAX) tablet 500 mg, 500 mg, Oral, Daily, Burgess Estelle, MD, 500 mg at 04/01/16 1042 .  cefTRIAXone (ROCEPHIN) 2 g in dextrose 5 % 50 mL IVPB, 2 g, Intravenous, Q24H, Kris Mouton, RPH, 2 g at 03/31/16 2027 .  docusate sodium (COLACE) capsule 100 mg, 100 mg, Oral, BID, Burgess Estelle, MD .  insulin aspart (novoLOG) injection 0-15 Units, 0-15 Units, Subcutaneous, TID WC, Burgess Estelle, MD, 3 Units at 04/01/16 1214 .  ondansetron (ZOFRAN) tablet 4 mg, 4 mg, Oral, Q6H PRN **OR** ondansetron (ZOFRAN) injection 4 mg, 4 mg, Intravenous, Q6H PRN, Burgess Estelle, MD .  oxyCODONE (Oxy IR/ROXICODONE)  immediate release tablet 5 mg, 5 mg, Oral, Q4H PRN, Burgess Estelle, MD, 5 mg at 04/01/16 0845 .  senna (SENOKOT) tablet 8.6 mg, 1 tablet, Oral, BID, Burgess Estelle, MD .  sodium chloride flush (NS) 0.9 % injection 3 mL, 3 mL, Intravenous, Q12H, Burgess Estelle, MD, 3 mL at 04/01/16 1000    History reviewed. No pertinent family history.  Social History   Social History  . Marital status: Divorced    Spouse name: N/A  . Number of children: N/A  . Years of education: N/A   Social History Main Topics  . Smoking status: Former Research scientist (life sciences)  . Smokeless tobacco: Never Used  . Alcohol use None  . Drug use: Unknown  . Sexual activity: Not Asked   Other Topics Concern  . None   Social History Narrative  . None    Review of Systems: A 12 point ROS discussed and pertinent positives are indicated in the HPI above.  All other systems are negative.  Review of Systems  Vital Signs: BP (!) 102/58   Pulse (!) 101   Temp 97.9 F (36.6 C) (Oral)   Resp 17   Ht '5\' 9"'$  (1.753 m)   Wt 225 lb 1.4 oz (102.1 kg)   SpO2 90%   BMI 33.24 kg/m   Physical Exam  Constitutional: He is oriented to person, place, and time. He appears well-developed and well-nourished. No distress.  HENT:  Head: Normocephalic.  Mouth/Throat: Oropharynx is clear and moist.  Neck: Normal range of motion. No JVD  present.  Cardiovascular: Normal rate, regular rhythm and normal heart sounds.   Pulmonary/Chest: Effort normal and breath sounds normal. No respiratory distress.  Palpable right chest/breast nodule/mass lateral to areola. NT  Neurological: He is alert and oriented to person, place, and time.  Psychiatric: He has a normal mood and affect. Judgment normal.    Mallampati Score:  MD Evaluation Airway: WNL Heart: WNL Abdomen: WNL Chest/ Lungs: WNL ASA  Classification: 3 Mallampati/Airway Score: Two  Imaging: Dg Chest 2 View  Result Date: 03/31/2016 CLINICAL DATA:  Right-sided chest pain EXAM: CHEST  2 VIEW  COMPARISON:  05/08/2011 FINDINGS: Cardiac shadow is mildly enlarged. There is increased density projecting in the superior segment of the right lower lobe consistent with pneumonia. Fullness in the hilar region is noted as well as in the right peritracheal region. No other focal abnormality is seen. No bony abnormality is noted. IMPRESSION: Changes in the superior segment of the right lower lobe likely related to acute pneumonia and reactive lymphadenopathy. Followup PA and lateral chest X-ray is recommended in 3-4 weeks following trial of antibiotic therapy to ensure resolution and exclude underlying malignancy. Electronically Signed   By: Inez Catalina M.D.   On: 03/31/2016 11:39   Ct Angio Chest Pe W/cm &/or Wo Cm  Result Date: 03/31/2016 CLINICAL DATA:  Patient with intermittent chest pain. Consolidation within the right lung on recent chest radiograph. EXAM: CT ANGIOGRAPHY CHEST WITH CONTRAST TECHNIQUE: Multidetector CT imaging of the chest was performed using the standard protocol during bolus administration of intravenous contrast. Multiplanar CT image reconstructions and MIPs were obtained to evaluate the vascular anatomy. CONTRAST:  100 cc Isovue 370 COMPARISON:  Chest radiograph 03/31/2016; 05/08/2011. FINDINGS: Cardiovascular: Normal heart size. No pericardial effusion. Coronary arterial vascular calcifications. Aorta and main pulmonary artery are normal in caliber. The right upper lobe pulmonary arteries are attenuated coursing over the superior aspect of the large right upper lobe area of consolidation. Within a right middle lobe pulmonary artery (image 145; series 607) there is a thin central filling defect. Mediastinum/Nodes: No axillary lymphadenopathy. There is a 2.6 cm right hilar lymph node (image 51; series 601). There is a 1.9 cm right peritracheal lymph node (image 40; series 601). Multiple prominent sub cm right peritracheal lymph nodes are demonstrated. Normal appearance of the esophagus.  Lungs/Pleura: Expiratory phase imaging. 7 mm left upper lobe pulmonary nodule (image 54; series 606). Dependent atelectasis within the lower lobes bilaterally. 5 mm subpleural right upper lobe nodule (image 55; series 606). Two adjacent large masslike areas of consolidation within the right upper lobe measuring 5.4 x 5.6 cm (image 101; series 607) and 5.1 x 4.9 cm (image 136; series 607). Few pleural based calcifications within the left hemi thorax. Upper Abdomen: Liver is diffusely low in attenuation. Indeterminate 2.9 x 1.7 cm left adrenal nodule. Normal morphology of the stomach. Musculoskeletal: Healed posterior right twelfth rib fracture. Thoracic spine degenerative changes. No aggressive or acute appearing osseous lesions. Marked degenerative changes involving the right shoulder joint. There is a 1.8 cm solid nodule within the right breast (image 62; series 601). Indeterminate 1.1 cm low-attenuation nodule within the right thyroid lobe. Review of the MIP images confirms the above findings. IMPRESSION: Small central filling defect within a right middle lobe pulmonary artery concerning for pulmonary embolism. Two adjacent large masslike areas of consolidation within the right upper lobe with differential considerations including malignancy or pneumonia. Multiple enlarged right hilar and mediastinal lymph nodes which may be reactive or metastatic in  etiology. Additional indeterminate pulmonary nodules as above. Recommend attention on follow-up. Indeterminate 2.9 cm left adrenal nodule. This needs dedicated evaluation with pre and post contrast-enhanced CT or MRI after confirmation of pulmonary process. Indeterminate nodule within the right breast. Recommend dedicated evaluation with mammography. Hepatic steatosis. Critical Value/emergent results were called by telephone at the time of interpretation on 03/31/2016 at 2:49 pm to Dr. Lita Mains , who verbally acknowledged these results. Electronically Signed   By:  Lovey Newcomer M.D.   On: 03/31/2016 14:54    Labs:  CBC:  Recent Labs  03/31/16 1145 04/01/16 0424  WBC 16.4* 14.3*  HGB 14.8 13.6  HCT 43.3 40.5  PLT 327 302    COAGS:  Recent Labs  04/01/16 0424  INR 1.13    BMP:  Recent Labs  03/31/16 1145 04/01/16 0424  NA 134* 138  K 4.4 4.1  CL 98* 103  CO2 25 25  GLUCOSE 150* 98  BUN 18 16  CALCIUM 9.1 8.8*  CREATININE 1.46* 1.39*  GFRNONAA 47* 49*  GFRAA 54* 57*    LIVER FUNCTION TESTS:  Recent Labs  03/31/16 1612 04/01/16 0424  BILITOT 0.6 0.5  AST 13* 12*  ALT 14* 14*  ALKPHOS 72 64  PROT 6.9 6.2*  ALBUMIN 3.7 3.3*    TUMOR MARKERS: No results for input(s): AFPTM, CEA, CA199, CHROMGRNA in the last 8760 hours.  Assessment and Plan: Right lung mass Right chest wall/breast mass Feel [name] bx of right chest wall mass is appropriate. If this is non-diagnostic, may need PCCM consult to consider bronch. Risks and Benefits discussed with the patient including, but not limited to bleeding, infection, damage to adjacent structures or low yield requiring additional tests. All of the patient's questions were answered, patient is agreeable to proceed. Consent signed and in chart.    Thank you for this interesting consult.  I greatly enjoyed meeting Obe G Pouncey and look forward to participating in their care.  A copy of this report was sent to the requesting provider on this date.  Electronically Signed: Ascencion Dike 04/01/2016, 1:43 PM   I spent a total of 20 minutes in face to face in clinical consultation, greater than 50% of which was counseling/coordinating care for chest wall biopsy

## 2016-04-01 NOTE — Progress Notes (Signed)
   Subjective:  Patient concerned that he is not getting his medicines that he usually gets at home for him blood pressure, cholesterol and diabetes. He denies shortness of breath, further chest pain or back pain.    Objective:  Vital signs in last 24 hours: Vitals:   03/31/16 2017 04/01/16 0417 04/01/16 0600 04/01/16 0822  BP: 113/71 (!) 89/46 (!) 102/58   Pulse: 97 88  (!) 101  Resp: 18 17    Temp: 97.7 F (36.5 C) 97.9 F (36.6 C)    TempSrc: Oral Oral    SpO2: 94% 97%  90%  Weight:  102.1 kg (225 lb 1.4 oz)    Height:       Constitutional: NAD, vs reviewed CV: RRR, no murmurs, rubs or gallops appreciated, minimal LE edema, pulses intact Resp: CTAB, no increased work of breathing, no crackles or wheezing appreciated today.  Abd: soft, distended, nontender, hard to appreciate organomegaly due to distension, +BS MSK: moves all extremities freely, ~1x1cm nodule below right nipple that is hard, mobile and nontender. Neuro: Strength and sensation intact. Reflexes intact. CN 2-12 intact  Assessment/Plan:  Active Problems:   Pulmonary embolism (HCC)   Mass of upper lobe of right lung   Mass of breast, right   Adrenal mass, left (HCC)   HTN (hypertension), benign  Probable malignancy suspicious for metastasis:  Patient with 2 large right lung masses, a right breast mass, and a left adrenal mass concerning for malignancy. Patient has risk factor for pulmonary malignancy in setting of long term tobacco abuse. We will pursue biopsy of right breast mass as it is easiest and lowest risk, with hopes that it will lead [name] to a primary etiology of the malignancy. IR has been consulted and patient will have core biopsy under ultrasound tomorrow. --npo past midnight for core biopsy under ultrasound of right breast mass --may need to plan for further imaging and PET scan if malignancy confirmed and referral to oncology  Pulmonary embolism: Patient had incidental finding of PE on CTA with no  associated symptoms of shortness of breath, pleuritic chest pain, or tachycardia. CTA was reviewed with radiologist and is a small central filling defect within right middle lobe pulm artery. Patient received two doses of therapeutic lovenox. We will hold further anticoagulation in setting of need to obtain more information about possible malignancy.  --hold lovenox for now for biopsy  Post-obstructive Pneumonia: Patient with findings on CTA consistent with post-obstructive pneumonia vs malignant infiltrate. Will continue treating with antibiotics for the time being.  --Azithromycin and ceftriaxone (day 2)  HTN: Patient with history of hypertension managed with amlodipine 2.'5mg'$  daily, metoprolol succinate '50mg'$  daily, and telmisartan-HCTZ 80-'25mg'$  daily. He has had low-normal BP since admission so we have been holding his antihypertensives. Will continue to monitor for re-introduction.   T2DM: --SSI-s  HLD: --simvastatin '20mg'$  daily  Dispo: Anticipated discharge in approximately 2-3 day(s).   Alphonzo Grieve, MD 04/01/2016, 3:50 PM Pager [phone]

## 2016-04-01 NOTE — Assessment & Plan Note (Signed)
Low volume single right middle pulmonary artery branch

## 2016-04-02 ENCOUNTER — Inpatient Hospital Stay (HOSPITAL_COMMUNITY): Payer: Medicare Other

## 2016-04-02 DIAGNOSIS — J188 Other pneumonia, unspecified organism: Secondary | ICD-10-CM

## 2016-04-02 DIAGNOSIS — Z85820 Personal history of malignant melanoma of skin: Secondary | ICD-10-CM

## 2016-04-02 LAB — BASIC METABOLIC PANEL
Anion gap: 10 (ref 5–15)
BUN: 20 mg/dL (ref 6–20)
CO2: 24 mmol/L (ref 22–32)
Calcium: 8.7 mg/dL — ABNORMAL LOW (ref 8.9–10.3)
Chloride: 104 mmol/L (ref 101–111)
Creatinine, Ser: 1.32 mg/dL — ABNORMAL HIGH (ref 0.61–1.24)
GFR calc Af Amer: >60 mL/min (ref >60)
GFR calc non Af Amer: 53 mL/min — ABNORMAL LOW (ref >60)
Glucose, Bld: 130 mg/dL — ABNORMAL HIGH (ref 65–99)
Potassium: 3.6 mmol/L (ref 3.5–5.1)
Sodium: 138 mmol/L (ref 135–145)

## 2016-04-02 LAB — CBC
HCT: 41 % (ref 39.0–52.0)
Hemoglobin: 13.7 g/dL (ref 13.0–17.0)
MCH: 28.8 pg (ref 26.0–34.0)
MCHC: 33.4 g/dL (ref 30.0–36.0)
MCV: 86.3 fL (ref 78.0–100.0)
Platelets: 321 10*3/uL (ref 150–400)
RBC: 4.75 MIL/uL (ref 4.22–5.81)
RDW: 13.5 % (ref 11.5–15.5)
WBC: 13.6 10*3/uL — ABNORMAL HIGH (ref 4.0–10.5)

## 2016-04-02 LAB — GLUCOSE, CAPILLARY
Glucose-Capillary: 146 mg/dL — ABNORMAL HIGH (ref 65–99)
Glucose-Capillary: 127 mg/dL — ABNORMAL HIGH (ref 65–99)
Glucose-Capillary: 111 mg/dL — ABNORMAL HIGH (ref 65–99)
Glucose-Capillary: 112 mg/dL — ABNORMAL HIGH (ref 65–99)

## 2016-04-02 MED ORDER — LIDOCAINE HCL 1 % IJ SOLN
INTRAMUSCULAR | Status: AC
  Filled 2016-04-02: qty 20

## 2016-04-02 MED ORDER — FENTANYL CITRATE (PF) 100 MCG/2ML IJ SOLN
INTRAMUSCULAR | Status: AC
  Filled 2016-04-02: qty 2

## 2016-04-02 MED ORDER — DICLOFENAC SODIUM 1 % TD GEL
4.0000 g | Freq: Four times a day (QID) | TRANSDERMAL | Status: DC | PRN
  Filled 2016-04-02: qty 100

## 2016-04-02 MED ORDER — MIDAZOLAM HCL 2 MG/2ML IJ SOLN
INTRAMUSCULAR | Status: AC
  Filled 2016-04-02: qty 2

## 2016-04-02 MED ORDER — HEPARIN BOLUS VIA INFUSION
5000.0000 [IU] | Freq: Once | INTRAVENOUS | Status: AC
  Administered 2016-04-02: 5000 [IU] via INTRAVENOUS
  Filled 2016-04-02: qty 5000

## 2016-04-02 MED ORDER — HEPARIN (PORCINE) IN NACL 100-0.45 UNIT/ML-% IJ SOLN
1600.0000 [IU]/h | INTRAMUSCULAR | Status: DC
  Administered 2016-04-02 – 2016-04-03 (×2): 1600 [IU]/h via INTRAVENOUS
  Filled 2016-04-02 (×2): qty 250

## 2016-04-02 MED ORDER — METOPROLOL SUCCINATE ER 25 MG PO TB24
25.0000 mg | ORAL_TABLET | Freq: Every evening | ORAL | Status: DC
  Administered 2016-04-02 – 2016-04-05 (×4): 25 mg via ORAL
  Filled 2016-04-02 (×5): qty 1

## 2016-04-02 MED ORDER — MIDAZOLAM HCL 2 MG/2ML IJ SOLN
INTRAMUSCULAR | Status: AC | PRN
  Administered 2016-04-02 (×2): 1 mg via INTRAVENOUS

## 2016-04-02 MED ORDER — FENTANYL CITRATE (PF) 100 MCG/2ML IJ SOLN
INTRAMUSCULAR | Status: AC | PRN
  Administered 2016-04-02 (×2): 50 ug via INTRAVENOUS

## 2016-04-02 NOTE — Progress Notes (Signed)
ANTICOAGULATION CONSULT NOTE - Initial Consult  Pharmacy Consult for heparin Indication: pulmonary embolus  No Known Allergies  Patient Measurements: Height: '5\' 9"'$  (175.3 cm) Weight: 226 lb 13.7 oz (102.9 kg) IBW/kg (Calculated) : 70.7 Heparin Dosing Weight: 92.7Kg  Vital Signs: BP: 112/77 (11/28 1300) Pulse Rate: 109 (11/28 1300)  Labs:  Recent Labs  03/31/16 1145 04/01/16 0424 04/02/16 0248  HGB 14.8 13.6 13.7  HCT 43.3 40.5 41.0  PLT 327 302 321  LABPROT  --  14.6  --   INR  --  1.13  --   CREATININE 1.46* 1.39* 1.32*    Estimated Creatinine Clearance: 60.7 mL/min (by C-G formula based on SCr of 1.32 mg/dL (H)).   Medical History: Past Medical History:  Diagnosis Date  . Adrenal mass, left ([place]) 04/01/2016  . Diabetes mellitus without complication ([place])   . Hypertension   . Mass of breast, right 04/01/2016  . Mass of upper lobe of right lung 04/01/2016   2 adjacent, 5 cm masses, hilar adenopathy    Medications:  Prescriptions Prior to Admission  Medication Sig Dispense Refill Last Dose  . ALPRAZolam (XANAX) 1 MG tablet Take 1 mg by mouth 3 (three) times daily as needed for anxiety.   03/30/2016 at Unknown time  . amLODipine (NORVASC) 2.5 MG tablet Take 2.5 mg by mouth daily.  4 03/30/2016 at Unknown time  . GLIPIZIDE XL 5 MG 24 hr tablet Take 5 mg by mouth daily.  4 03/30/2016 at Unknown time  . metFORMIN (GLUCOPHAGE) 500 MG tablet Take 500 mg by mouth 2 (two) times daily.  4 03/30/2016 at Unknown time  . metoprolol succinate (TOPROL-XL) 50 MG 24 hr tablet Take 50 mg by mouth every evening.  3 03/30/2016 at 2100  . simvastatin (ZOCOR) 20 MG tablet Take 20 mg by mouth every evening.  5 03/30/2016 at Unknown time  . telmisartan-hydrochlorothiazide (MICARDIS HCT) 80-25 MG tablet Take 1 tablet by mouth daily.  3 03/30/2016 at Unknown time    Assessment: Darrell Christensen is a 70yo male found to have pulmonary embolism. Imaging revealed multiple masses on the right  lung suspicious for cancer. Patient given one dose of Lovenox on 11/27 at 0630, no anticoagulation since that time. CBC stable WNL.  Goal of Therapy:  Heparin level 0.3-0.7 units/ml Monitor platelets by anticoagulation protocol: Yes   Plan:  Give 5000 units bolus x 1 Start heparin infusion at 1600 units/hr Check anti-Xa level in 6 hours and daily while on heparin Continue to monitor H&H and platelets  Aixa Corsello L Jayron Maqueda 04/02/2016,5:11 PM

## 2016-04-02 NOTE — Sedation Documentation (Signed)
Patient is resting comfortably. 

## 2016-04-02 NOTE — Progress Notes (Signed)
   Subjective:  No acute events overnight. Patient has intermittent pain between his shoulder blades when he moves around - he does not like taking pain medicines. No recurrent chest pain, no shortness of breath, dizziness reported.   Objective:  Vital signs in last 24 hours: Vitals:   04/01/16 1605 04/01/16 1617 04/01/16 1950 04/02/16 0440  BP:  121/77 112/84 112/77  Pulse: (!) 112 (!) 110 (!) 102 (!) 102  Resp:  '18 18 16  '$ Temp:  97.4 F (36.3 C) 97.8 F (36.6 C) 98.6 F (37 C)  TempSrc:  Oral Oral Oral  SpO2: 96% 97% 96% 94%  Weight:    102.9 kg (226 lb 13.7 oz)  Height:       Constitutional: NAD, vs reviewed CV: RRR, no murmurs, rubs or gallops appreciated, no LE edema, pulses intact Resp: CTAB, no increased work of breathing, no crackles or wheezing appreciated today.  Abd: soft, distended, nontender, hard to appreciate organomegaly due to distension, +BS MSK: moves all extremities freely, ~1x1cm nodule below right nipple that is hard, mobile and nontender. Neuro: Strength and sensation intact. CN 2-12 intact  Assessment/Plan:  Active Problems:   Pulmonary embolism (HCC)   Mass of upper lobe of right lung   Mass of breast, right   Adrenal mass, left (HCC)   HTN (hypertension), benign  Probable malignancy suspicious for metastasis:  Patient with 2 large right lung masses, a right breast mass, and a left adrenal mass concerning for malignancy. Patient has risk factor for pulmonary malignancy in setting of long term tobacco abuse. We will pursue biopsy of right breast mass as it is easiest and lowest risk, with hopes that it will lead [name] to a primary etiology of the malignancy. IR has been consulted and patient will have core biopsy under ultrasound today. --core biopsy under ultrasound of right breast mass with IR today - appreciate their assistance --may need to plan for further imaging and PET scan if malignancy confirmed and referral to oncology  Pulmonary  embolism: Patient had incidental finding of PE on CTA with no associated symptoms of shortness of breath, pleuritic chest pain, or tachycardia. CTA was reviewed with radiologist and is a small central filling defect within right middle lobe pulm artery. Patient received two doses of therapeutic lovenox. We will hold further anticoagulation in setting of need to obtain more information about possible malignancy.  --hold lovenox for now for biopsy  Post-obstructive Pneumonia: Patient with findings on CTA consistent with post-obstructive pneumonia vs malignant infiltrate. Will continue treating with antibiotics for the time being.  --Azithromycin and ceftriaxone (day 3)  HTN: Patient with history of hypertension managed with amlodipine 2.'5mg'$  daily, metoprolol succinate '50mg'$  daily, and telmisartan-HCTZ 80-'25mg'$  daily. He has had low-normal BP since admission so we have been holding his antihypertensives except amlodipine. Will continue to monitor for re-introduction. --Amlodipine 2.'5mg'$  daily  T2DM: --SSI-s  HLD: --simvastatin '20mg'$  daily  Dispo: Anticipated discharge in approximately 2-3 day(s).   Alphonzo Grieve, MD 04/02/2016, 8:26 AM Pager [phone]

## 2016-04-02 NOTE — Sedation Documentation (Signed)
Pt tolerating procedure well, biopsies taken.

## 2016-04-02 NOTE — Procedures (Signed)
Interventional Radiology Procedure Note  Procedure:  US guided core biopsy of right chest wall mass  Complications: None  Estimated Blood Loss: < 10 mL  1.5 cm right chest wall/subcutaneous soft tissue mass localized and sampled with 16 G core device x 4.  Darrell Christensen. Kathlene Cote, M.D Pager:  [phone]

## 2016-04-02 NOTE — Sedation Documentation (Signed)
Patient is resting comfortably. No complaints from pt at this time.  

## 2016-04-02 NOTE — Progress Notes (Signed)
Subjective: Darrell Christensen reports doing well. He is accompanied with his daughter Darrell Christensen. He states he has had no issues overnight. He states his pain is well controlled if he is not moving. But when he rotates his chest, he has about 9/10 pain. The patient denies any other SOB or chest pain.   Darrell Christensen states the patient is very scared about this possible diagnosis and needs help understanding what is going on. She states he has become increasing forgetful and has difficulty remember faces. She says that her father will try to act "tough" but is hiding symptoms. She states that he has become very SOB over the year and has a lot of back pain. She says he has become acutely distended in his belly. She says he has stopped driving at night because of his poor vision. Darrell Christensen also offered that the patient had melanoma removed from his chest wall, right arm and back about 3 years ago.   Time was spent showing and explaining the images from the CXR and CTA to the patient and Darrell Christensen.  Objective: Vital signs in last 24 hours: Vitals:   04/01/16 1605 04/01/16 1617 04/01/16 1950 04/02/16 0440  BP:  121/77 112/84 112/77  Pulse: (!) 112 (!) 110 (!) 102 (!) 102  Resp:  '18 18 16  '$ Temp:  97.4 F (36.3 C) 97.8 F (36.6 C) 98.6 F (37 C)  TempSrc:  Oral Oral Oral  SpO2: 96% 97% 96% 94%  Weight:    102.9 kg (226 lb 13.7 oz)  Height:       Weight change: 0.2 kg (7.1 oz)  Intake/Output Summary (Last 24 hours) at 04/02/16 1607 Last data filed at 04/01/16 1900  Gross per 24 hour  Intake              960 ml  Output                0 ml  Net              960 ml   Gen: Patient is well appearing, lying in bed, fully conversant with team, Oriented x 3. HEENT: normocephalic and atraumatic. Pupils are equally round and reactive to light. Moist mucus membranes. Anicteric. No oral lesions present. Tongue is enlarged. No Facial Edema. No Thyriodmegaly. Lymphatics: enlarged R submandibular node. no lymphadenopathy  present posterior cervical, posterior auricular, occipital, periauricular, parotid, submental. No axillary, supraclavicular lymphadenopathy present.  Cv: No sternal scars present.   Regular rate and rhythm. NL s1 s2 with no heaves rubs or murmurs. JVP present to 2 cms above the clavicle at 30 degrees Vascular: Radial, dorsalis pedis and posterior tibial pulses 2+.  Resp: No scars present on back. Normal work of breathing. Clear to ascultation bilaterally. Tympanic percussion throughout.  Abdomen: No abdominal scars present. Distended. Normal Bowel sounds. Nontender to palpitation. Normal percussion throughout. No organomegaly or masses.  Neuro: Oriented x3. EOM intact. Facial motor symmetry. Facial sensation is symmetric. Uvula and tongue are midline. Grossly normal strength throughout. Sensation is symmetric throughout. Finger to nose and heel to shin are normal.  Ext: Warm extremities. No LE edema. No cyanosis or clubbing. No rashes present. MSK: No obvious deformed joints. Palpable mobile and soft ~2x2 cm R breast nodule present just lateral to the nipple. No shoulder pain or arm pain with motion. No proximal muscle weakness.   Lab Results: WBC is elevated at 13.6.  CMP is wnl.  Studies/Results: CXR - Pa, consolidation in medial right upper lope  that disrupts the right heart border. This may be due to hilar lymphadenopathy. On lateral, mass in pressing trachea and pressing against spine. See imagining note for formal report  CTA - Two 50 x 50 mm masses present in the RUL. Right breast mass also present. Adrenal mass also noted. Enlarged nodes and other nodules present. Filling defect within right middle lobe pulmonary artery concerning for pulmonary embolism. See imagining note for formal report.  Medications: I have reviewed the patient's current medications. Scheduled Meds: . amLODipine  2.5 mg Oral Daily  . azithromycin  500 mg Oral Daily  . cefTRIAXone (ROCEPHIN)  IV  2 g Intravenous  Q24H  . docusate sodium  100 mg Oral BID  . insulin aspart  0-15 Units Subcutaneous TID WC  . senna  1 tablet Oral BID  . simvastatin  20 mg Oral QPM  . sodium chloride flush  3 mL Intravenous Q12H   Continuous Infusions: PRN Meds:.acetaminophen **OR** acetaminophen, ALPRAZolam, ondansetron **OR** ondansetron (ZOFRAN) IV, oxyCODONE Assessment/Plan: Active Problems:   Pulmonary embolism (HCC)   Mass of upper lobe of right lung   Mass of breast, right   Adrenal mass, left (HCC)   HTN (hypertension), benign  Darrell Christensen, Darrell Christensen is a 70 yo M with a smoking history of 50 pack years, a family history of lung cancer in his father and brother and a PMH HTN, T2DM and h/o alcohol abuse who present to the ED with intermittent chest pain for years and thoracic back pain for 1 month who was found to have R lung masses.   Possible Malignancy of unknown origin Patient has had thoracic back pain for about 1 month with intermittent right lower chest pain for years. During evaluation in the Ed, CXR was notable for mass in the RUL. CTA was notable for two masses in the RUL measuring 54 x 56 and 51 x 49 mm adjacent to the bronchi and spine. Other nodes were also present: Hilar node, tracheal node, L upper pulmonary nodule, subpleural nodules and few pleural based calcifications in left hemithorax. Right breast nodule and left adrenal nodule. These nodules are most likely a primary lung malignancy in the setting of a 50 pack year history and a positive family history of lung cancer in father and brother.    - Evaluation of right breast nodule with core needle biopsy to determine next step 24 hrs post anticoag  - If negative, will need to lavage for further evaluation   - Patient does not exhibit signs of horner syndrome  - No signs of SVC syndrome with no facial edema or significant JVP  - No signs of Rita Ohara     - Patient Acetaminophen 650 mg q6 PRN  - oxycodone '5mg'$  q4 PRN  Obstructive Pneumonia -  Patient's tachycardia and elevated WBC may be consistent with an obstructive pneumonia due to malignancy. However, patient remains afebrile.  Will continue to treat with Azi+Ceftriax to cover for CAP. WBC is downtrending since starting Abx.    - Azithromycin 500 mg q24 PO  - Ceftriaxone 2g IV q24  - cbc qd  - cmp qd  Pulmonary embolism likely associated with malignancy Patient was tachycardic and satting in mid 90's without oxygen requirement. No complaints of SOB or hemoptysis. CTA consistent with pulmonary embolus within right middle lobe pulmonary artery.    - unfractionated heparin for fast reversal and anticoag  HTN Managed with amlodipine 2.'5mg'$  qd, metoprolol succinate '50mg'$  qd, and telmisartan-HCTZ 80-'25mg'$  qd at home   -  Amlodipine 2.5 mg qd  - Will add on metoprolol 25 mg qd  Anxiety   - Alprazolam 1 mg qhs oral  T2DM: Patient with T2DM on glipizide '5mg'$  daily and metformin '500mg'$  BID.  - Hold oral meds  - SSI-S  HLD: Patient with hyperlipidemia on simvastain '20mg'$  daily.   - continue home regimem  FENGI  Patient reports no trouble with constipation or abdominal pain/nausea.   - HH diet  - colace 100 mg BID PO  - senna 8.6 mg BID PO  - ondansetron '4mg'$  q6 PRN PO DVT: hold   CODE  - Full  This is a Careers information officer Note.  The care of the patient was discussed with Dr. Jari Favre and the assessment and plan formulated with their assistance.  Please see their attached note for official documentation of the daily encounter.   LOS: 2 days   Benn Moulder, Medical Student 04/02/2016, 6:14 AM

## 2016-04-03 ENCOUNTER — Inpatient Hospital Stay (HOSPITAL_COMMUNITY): Payer: Medicare Other

## 2016-04-03 DIAGNOSIS — C499 Malignant neoplasm of connective and soft tissue, unspecified: Secondary | ICD-10-CM

## 2016-04-03 DIAGNOSIS — C801 Malignant (primary) neoplasm, unspecified: Secondary | ICD-10-CM

## 2016-04-03 LAB — BASIC METABOLIC PANEL
Anion gap: 10 (ref 5–15)
BUN: 20 mg/dL (ref 6–20)
CO2: 24 mmol/L (ref 22–32)
Calcium: 8.6 mg/dL — ABNORMAL LOW (ref 8.9–10.3)
Chloride: 103 mmol/L (ref 101–111)
Creatinine, Ser: 1.52 mg/dL — ABNORMAL HIGH (ref 0.61–1.24)
GFR calc Af Amer: 51 mL/min — ABNORMAL LOW (ref >60)
GFR calc non Af Amer: 44 mL/min — ABNORMAL LOW (ref >60)
Glucose, Bld: 116 mg/dL — ABNORMAL HIGH (ref 65–99)
Potassium: 4.2 mmol/L (ref 3.5–5.1)
Sodium: 137 mmol/L (ref 135–145)

## 2016-04-03 LAB — CBC
HCT: 40.2 % (ref 39.0–52.0)
Hemoglobin: 13.3 g/dL (ref 13.0–17.0)
MCH: 28.7 pg (ref 26.0–34.0)
MCHC: 33.1 g/dL (ref 30.0–36.0)
MCV: 86.8 fL (ref 78.0–100.0)
Platelets: 336 10*3/uL (ref 150–400)
RBC: 4.63 MIL/uL (ref 4.22–5.81)
RDW: 13.7 % (ref 11.5–15.5)
WBC: 14.7 10*3/uL — ABNORMAL HIGH (ref 4.0–10.5)

## 2016-04-03 LAB — HEPARIN LEVEL (UNFRACTIONATED)
Heparin Unfractionated: 0.47 IU/mL (ref 0.30–0.70)
Heparin Unfractionated: 0.6 IU/mL (ref 0.30–0.70)

## 2016-04-03 LAB — GLUCOSE, CAPILLARY
Glucose-Capillary: 118 mg/dL — ABNORMAL HIGH (ref 65–99)
Glucose-Capillary: 167 mg/dL — ABNORMAL HIGH (ref 65–99)
Glucose-Capillary: 141 mg/dL — ABNORMAL HIGH (ref 65–99)
Glucose-Capillary: 98 mg/dL (ref 65–99)

## 2016-04-03 MED ORDER — LORAZEPAM 1 MG PO TABS: 1.0000 mg | ORAL_TABLET | Freq: Once | ORAL | Status: DC

## 2016-04-03 MED ORDER — DEXTROSE 5 % IV SOLN
1.0000 g | INTRAVENOUS | Status: AC
  Administered 2016-04-04: 1 g via INTRAVENOUS
  Filled 2016-04-03 (×2): qty 10

## 2016-04-03 MED ORDER — LORAZEPAM 2 MG/ML IJ SOLN
1.0000 mg | Freq: Once | INTRAMUSCULAR | Status: AC
  Administered 2016-04-03: 1 mg via INTRAVENOUS
  Filled 2016-04-03: qty 0.5

## 2016-04-03 MED ORDER — AZITHROMYCIN 500 MG PO TABS
500.0000 mg | ORAL_TABLET | Freq: Every day | ORAL | Status: AC
  Administered 2016-04-04: 500 mg via ORAL
  Filled 2016-04-03 (×2): qty 1

## 2016-04-03 MED ORDER — LEVOFLOXACIN 750 MG PO TABS
750.0000 mg | ORAL_TABLET | Freq: Every day | ORAL | Status: DC
  Administered 2016-04-04 – 2016-04-05 (×2): 750 mg via ORAL
  Filled 2016-04-03 (×3): qty 1

## 2016-04-03 MED ORDER — LORAZEPAM 1 MG PO TABS
1.0000 mg | ORAL_TABLET | Freq: Once | ORAL | Status: DC
  Filled 2016-04-03: qty 1

## 2016-04-03 MED ORDER — GADOBENATE DIMEGLUMINE 529 MG/ML IV SOLN
20.0000 mL | Freq: Once | INTRAVENOUS | Status: AC
  Administered 2016-04-03: 20 mL via INTRAVENOUS

## 2016-04-03 MED ORDER — RIVAROXABAN 15 MG PO TABS
15.0000 mg | ORAL_TABLET | Freq: Two times a day (BID) | ORAL | Status: DC
  Administered 2016-04-03 – 2016-04-04 (×2): 15 mg via ORAL
  Filled 2016-04-03 (×3): qty 1

## 2016-04-03 NOTE — Progress Notes (Signed)
Medicine attending: I examined this patient today and reviewed preliminary pathology with the pathologist together with resident physician Dr. Alphonzo Grieve and third-year medical student Mr. Karma Lew and I concur with the evaluation and management plan which we discussed together. He tolerated the transcutaneous needle biopsy of the right breast nodule. Pathology shows a large, poorly differentiated, spindle cell neoplasm. Special studies will be done for further characterization. Preliminary results discussed with the patient with respect to the fact that we have confirmed our suspicion of a malignant process. We will transition him off parenteral heparin and onto Rivaroxaban oral anticoagulation. Get an MRI of his brain as part of ongoing staging evaluation. He will need a PET scan as an outpatient and referral to oncology for further management. Family not present this morning. We will contact his daughter when we have more information.

## 2016-04-03 NOTE — Progress Notes (Signed)
Subjective: The diagnosis of a malignant cancer was discussed with Mr. Hisaw. He understood the workup required for this new diagnosis and follow up with an oncologist. When discussing an MRI to look for brain metastasis, he stated he could not stay "in that hole" without sedation. He asked [name] to discuss the diagnosis with his Daughter when she returns at 40.   The patient reports no symptoms of SOB or chest pain. He says his back pain is controlled by not moving. He is afraid of using the oxycodone we prescribed for pain because of his h/o alcohol abuse. He tried acetaminophen yesterday but could not comment on its efficacy. Objective: Vital signs in last 24 hours: Vitals:   04/02/16 1040 04/02/16 1300 04/02/16 2100 04/03/16 0500  BP: 113/70 112/77 120/70 115/70  Pulse: 100 (!) 109 85 80  Resp: '20 20 18 18  '$ Temp:   98.1 F (36.7 C) 97.4 F (36.3 C)  TempSrc:   Oral Oral  SpO2: 95% 96% 95% 95%  Weight:    103 kg (227 lb 1.2 oz)  Height:       Weight change: 0.1 kg (3.5 oz)  Intake/Output Summary (Last 24 hours) at 04/03/16 0914 Last data filed at 04/02/16 1230  Gross per 24 hour  Intake              360 ml  Output                0 ml  Net              360 ml   Gen: Patient is well appearing, lying in bed, fully conversant with team, Oriented x 3. HEENT: normocephalic and atraumatic. Pupils are equally round and reactive to light. Moist mucus membranes. Anicteric. No oral lesions present. Tongue is enlarged. No Facial Edema. No Thyriodmegaly. Lymphatics: enlarged R submandibular node. no lymphadenopathy present posterior cervical, posterior auricular, occipital, periauricular, parotid, submental. No axillary, supraclavicular lymphadenopathy present.  Cv: No sternal scars present.   Regular rate and rhythm. NL s1 s2 with no heaves rubs or murmurs. JVP present to 2 cms above the clavicle at 30 degrees Vascular: Radial, dorsalis pedis and posterior tibial pulses 2+.  Resp: No scars  present on back. Normal work of breathing. Clear to ascultation bilaterally. Tympanic percussion throughout.  Abdomen: No abdominal scars present. Distended. Normal Bowel sounds. Nontender to palpitation. Normal percussion throughout. No organomegaly or masses.  Neuro: Oriented x3. EOM intact. Facial motor symmetry. Facial sensation is symmetric. Uvula and tongue are midline. Grossly normal strength throughout. Sensation is symmetric throughout. Finger to nose and heel to shin are normal.  Ext: Warm extremities. No LE edema. No cyanosis or clubbing. No rashes present. MSK: No obvious deformed joints. Palpable mobile and soft ~2x2 cm R breast nodule present just lateral to the nipple. No shoulder pain or arm pain with motion. No proximal muscle weakness.  Lab Results: Daily cbc was notable for an elevated wbc 14.7. BMP was significant for an elevated Cr at 1.52 which is consistent with his baseline.  Micro Results: No results found for this or any previous visit (from the past 240 hour(s)). Studies/Results:  Preliminary biopsy of R breast nodule show poorly differentiated spindle cells suggestive of a malignant cancer. Further workup of the sample will determine its etiology. Melanoma vs Primary lung  CXR - Pa, consolidation in medial right upper lope that disrupts the right heart border. This may be due to hilar lymphadenopathy. On lateral, mass in  pressing trachea and pressing against spine. See imagining note for formal report  CTA - Two 50 x 50 mm masses present in the RUL. Right breast mass also present. Adrenal mass also noted. Enlarged nodes and other nodules present. Filling defect within right middle lobe pulmonary artery concerning for pulmonary embolism. See imagining note for formal report.  Medications: I have reviewed the patient's current medications. Scheduled Meds: . amLODipine  2.5 mg Oral Daily  . cefTRIAXone (ROCEPHIN)  IV  2 g Intravenous Q24H  . docusate sodium  100 mg Oral  BID  . insulin aspart  0-15 Units Subcutaneous TID WC  . metoprolol succinate  25 mg Oral QPM  . senna  1 tablet Oral BID  . simvastatin  20 mg Oral QPM  . sodium chloride flush  3 mL Intravenous Q12H   Continuous Infusions: . heparin 1,600 Units/hr (04/03/16 0723)   PRN Meds:.acetaminophen **OR** acetaminophen, ALPRAZolam, diclofenac sodium, ondansetron **OR** ondansetron (ZOFRAN) IV, oxyCODONE Assessment/Plan: Active Problems:   Pulmonary embolus (HCC)   Lung mass   Breast nodule   Adrenal mass, left (HCC)   HTN (hypertension), benign  Stoy, Ava is a 70 yo M with a smoking history of 50 pack years, a family history of lung cancer in his father and brother and a PMH HTN, T2DM and h/o alcohol abuse who present to the ED with intermittent chest pain for years and thoracic back pain for 1 month who was found to have R lung masses.   Possible Malignancy of unknown origin Patient has had thoracic back pain for about 1 month with intermittent right lower chest pain for years. During evaluation in the Ed, CXR was notable for mass in the RUL. CTA was notable for two masses in the RUL measuring 54 x 56 and 51 x 49 mm adjacent to the bronchi and spine. Other nodes were also present: Hilar node, tracheal node, L upper pulmonary nodule, subpleural nodules and few pleural based calcifications in left hemithorax. Right breast nodule and left adrenal nodule. These nodules are most likely a primary lung malignancy in the setting of a 50 pack year history and a positive family history of lung cancer in father and brother. Patient does not exhibit signs of horner syndrome. No signs of SVC syndrome with no facial edema or significant JVP. No signs of Rita Ohara as well.   - Awaiting evaluation of right breast nodule with core needle biopsy - Preliminary results suggest malignancy - Melanoma vs Primary lung  - Will sedation MRI Head w w/o C for brain mets  - Outpatient PET set up - Acetaminophen  650 mg q6 PRN - oxycodone '5mg'$  q4 PRN  Obstructive Pneumonia- Patient's tachycardia and elevated WBC may be consistent with an obstructive pneumonia due to malignancy. However, patient remains afebrile. Ttreated with Azi+Ceftriax x 4 days to cover for CAP.  Will switch to oral for home Abx   - Levofloxacin 750 mg PO x 6 days - cbc qd - cmp qd  Pulmonary embolism likely associated with malignancyPatient wastachycardic and satting in mid 90's without oxygen requirement. No complaints of SOB or hemoptysis. CTA consistent with pulmonary embolus within right middle lobe pulmonary artery. Initially received Lovenox for one day. Withheld 2/2 to biopsy. Continued with unfractionated heparin until determined no need for quick reversal. Switched to Xarelto at dc.    - Stopped unfractionated heparin   - Xarelto 15 mg BID x 21 days, then '20mg'$  qd HTN Managed with amlodipine 2.'5mg'$  qd,  metoprolol succinate '50mg'$  qd, and telmisartan-HCTZ 80-'25mg'$  qd at home   - Amlodipine 2.5 mg qd  - metoprolol 50 mg qd  Anxiety   - Alprazolam 1 mg qhs oral  T2DM: Patient with T2DM on glipizide '5mg'$  daily and metformin '500mg'$  BID. - Hold oral meds - SSI-S  HLD: Patient with hyperlipidemia on simvastain '20mg'$  daily.   - continue home regimem  FENGI  Patient reports no trouble with constipation or abdominal pain/nausea.   - HH diet  - colace 100 mg BID PO - senna 8.6 mg BID PO - ondansetron '4mg'$  q6 PRN PO DVT: hold   CODE - Full  Dispo  - Tomorrow  - f/u oncology with path report  This is a Careers information officer Note.  The care of the patient was discussed with Dr. Jari Favre and the assessment and plan formulated with their assistance.  Please see their attached note for official documentation of the daily encounter.   LOS: 3 days   Benn Moulder, Medical Student 04/03/2016, 9:14 AM

## 2016-04-03 NOTE — Progress Notes (Signed)
   Subjective:  No acute events overnight. Patient tolerated biopsy procedure well yesterday. We discussed with patient preliminary results of malignancy per path of breast mass and the general next steps in the process of getting treatment of his malignancy as discussed below.  In the afternoon, was made aware that daughter and brother had arrived and wanted to get an update on our findings so far and the plan ahead as they (particularly daughter, [name]) will be main support for patient going forward. I discussed with family and patient again what information we have so far and what is to come in the near future in terms of imaging and following up with oncology.  Objective:  Vital signs in last 24 hours: Vitals:   04/03/16 0500 04/03/16 0845 04/03/16 1330 04/03/16 1957  BP: 115/70 (!) 143/86 (!) 131/91 118/72  Pulse: 80 85 100 90  Resp: '18 18 18 18  '$ Temp: 97.4 F (36.3 C) 97.8 F (36.6 C) 97.8 F (36.6 C) 97.4 F (36.3 C)  TempSrc: Oral Oral Oral Oral  SpO2: 95% 94% 92% 93%  Weight: 227 lb 1.2 oz (103 kg)     Height:       Constitutional: NAD, vs reviewed CV: RRR, no murmurs, rubs or gallops appreciated, no LE edema, pulses intact Resp: CTAB, no increased work of breathing, no crackles or wheezing appreciated today.  Abd: soft, distended, nontende, +BS MSK: moves all extremities freely Neuro: Strength and sensation intact. CN 2-12 intact  Assessment/Plan:  Active Problems:   Pulmonary embolus (HCC)   Lung mass   Breast nodule   Adrenal mass, left (HCC)   HTN (hypertension), benign   Malignancy ([place])  Probable malignancy suspicious for metastasis:  Patient with 2 large right lung masses, a right breast mass, and a left adrenal mass concerning for malignancy. Patient has risk factor for pulmonary malignancy in setting of long term tobacco abuse. Core biopsy of breast nodule was obtained. We discussed with pathology who stated tissue showed malignant transformation. Further  stains and studies will be done to determine etiology of the malignancy but most likely will be lung. Patient had apparently had at least one melanoma skin lesion removed about 3 years ago.  --MRI brain w/ contrast to evaluate for brain mets --Will set up for outpatient PET scan and oncology referral.  Pulmonary embolism: Patient had incidental finding of PE on CTA with no associated symptoms of shortness of breath, pleuritic chest pain, or tachycardia. CTA was reviewed with radiologist and is a small central filling defect within right middle lobe pulm artery. Patient received two doses of therapeutic lovenox before biopsy; was restarted afterward on heparin for possible need for further procedures. Once it as ascertained that tissue sample was adequate, patient was transitioned off of heparin drip and onto Xarelto.  --stop Heparin drip --start Xarelto '15mg'$  BID x 21days, then '20mg'$  daily   Post-obstructive Pneumonia: Patient with findings on CTA consistent with post-obstructive pneumonia vs malignant infiltrate. Will continue treating with antibiotics for the time being.  --Azithromycin and ceftriaxone (day 4) --Switch to levaquin '750mg'$  x6 days tomorrow  HTN: Patient with history of hypertension managed with amlodipine 2.'5mg'$  daily, metoprolol succinate '50mg'$  daily, and telmisartan-HCTZ 80-'25mg'$  daily. Will continue to monitor for re-introduction of telmisartan-HCTZ. --Amlodipine 2.'5mg'$  daily --metoprolol succinate '50mg'$  daily  T2DM: --SSI-s  HLD: --simvastatin '20mg'$  daily  Dispo: Anticipated discharge tomorrow.   Alphonzo Grieve, MD 04/03/2016, 10:15 PM Pager [phone]

## 2016-04-03 NOTE — Progress Notes (Signed)
ANTICOAGULATION CONSULT NOTE Pharmacy Consult for heparin Indication: pulmonary embolus  No Known Allergies  Patient Measurements: Height: '5\' 9"'$  (175.3 cm) Weight: 226 lb 13.7 oz (102.9 kg) IBW/kg (Calculated) : 70.7 Heparin Dosing Weight: 92.7Kg  Vital Signs: Temp: 98.1 F (36.7 C) (11/28 2100) Temp Source: Oral (11/28 2100) BP: 120/70 (11/28 2100) Pulse Rate: 85 (11/28 2100)  Labs:  Recent Labs  03/31/16 1145 04/01/16 0424 04/02/16 0248 04/03/16 0007  HGB 14.8 13.6 13.7  --   HCT 43.3 40.5 41.0  --   PLT 327 302 321  --   LABPROT  --  14.6  --   --   INR  --  1.13  --   --   HEPARINUNFRC  --   --   --  0.60  CREATININE 1.46* 1.39* 1.32*  --     Estimated Creatinine Clearance: 60.7 mL/min (by C-G formula based on SCr of 1.32 mg/dL (H)).  Assessment: 70 y.o. male with PE for heparin  Goal of Therapy:  Heparin level 0.3-0.7 units/ml Monitor platelets by anticoagulation protocol: Yes   Plan:  Continue Heparin at current rate   Axil Copeman, Bronson Curb 04/03/2016,1:07 AM

## 2016-04-03 NOTE — Progress Notes (Signed)
ANTICOAGULATION CONSULT NOTE  Pharmacy Consult for heparin Indication: pulmonary embolus  No Known Allergies  Patient Measurements: Height: '5\' 9"'$  (175.3 cm) Weight: 227 lb 1.2 oz (103 kg) IBW/kg (Calculated) : 70.7 Heparin Dosing Weight: 92.7Kg  Vital Signs: Temp: 97.4 F (36.3 C) (11/29 0500) Temp Source: Oral (11/29 0500) BP: 115/70 (11/29 0500) Pulse Rate: 80 (11/29 0500)  Labs:  Recent Labs  04/01/16 0424 04/02/16 0248 04/03/16 0007 04/03/16 0659  HGB 13.6 13.7 13.3  --   HCT 40.5 41.0 40.2  --   PLT 302 321 336  --   LABPROT 14.6  --   --   --   INR 1.13  --   --   --   HEPARINUNFRC  --   --  0.60 0.47  CREATININE 1.39* 1.32* 1.52*  --     Estimated Creatinine Clearance: 52.7 mL/min (by C-G formula based on SCr of 1.52 mg/dL (H)).   Medical History: Past Medical History:  Diagnosis Date  . Adrenal mass, left ([place]) 04/01/2016  . Diabetes mellitus without complication ([place])   . Hypertension   . Mass of breast, right 04/01/2016  . Mass of upper lobe of right lung 04/01/2016   2 adjacent, 5 cm masses, hilar adenopathy    Medications:  Prescriptions Prior to Admission  Medication Sig Dispense Refill Last Dose  . ALPRAZolam (XANAX) 1 MG tablet Take 1 mg by mouth 3 (three) times daily as needed for anxiety.   03/30/2016 at Unknown time  . amLODipine (NORVASC) 2.5 MG tablet Take 2.5 mg by mouth daily.  4 03/30/2016 at Unknown time  . GLIPIZIDE XL 5 MG 24 hr tablet Take 5 mg by mouth daily.  4 03/30/2016 at Unknown time  . metFORMIN (GLUCOPHAGE) 500 MG tablet Take 500 mg by mouth 2 (two) times daily.  4 03/30/2016 at Unknown time  . metoprolol succinate (TOPROL-XL) 50 MG 24 hr tablet Take 50 mg by mouth every evening.  3 03/30/2016 at 2100  . simvastatin (ZOCOR) 20 MG tablet Take 20 mg by mouth every evening.  5 03/30/2016 at Unknown time  . telmisartan-hydrochlorothiazide (MICARDIS HCT) 80-25 MG tablet Take 1 tablet by mouth daily.  3 03/30/2016 at Unknown  time    Assessment: Darrell Christensen is a 70yo male found to have pulmonary embolism. Imaging revealed multiple masses on the right lung suspicious for cancer. Patient given one dose of Lovenox on 11/27 at 0630, no anticoagulation since that time. Pharmacy consulted to start heparin. CBC stable WNL. No bleeding documented.  Heparin level remains therapeutic (0.47) at 1600 units/h. S/p biopsy this AM.   Goal of Therapy:  Heparin level 0.3-0.7 units/ml Monitor platelets by anticoagulation protocol: Yes   Plan:  Heparin infusion at 1600 units/hr Check anti-Xa level daily while on heparin Continue to monitor H&H and platelets Monitor for s/sx bleeding F/u long-term anticoagulation plan post-biopsy   Elicia Lamp, PharmD, BCPS Clinical Pharmacist 04/03/2016 8:31 AM

## 2016-04-04 ENCOUNTER — Encounter (HOSPITAL_COMMUNITY): Payer: Self-pay | Admitting: Radiology

## 2016-04-04 LAB — BASIC METABOLIC PANEL
Anion gap: 9 (ref 5–15)
BUN: 17 mg/dL (ref 6–20)
CO2: 22 mmol/L (ref 22–32)
Calcium: 8.6 mg/dL — ABNORMAL LOW (ref 8.9–10.3)
Chloride: 105 mmol/L (ref 101–111)
Creatinine, Ser: 1.17 mg/dL (ref 0.61–1.24)
GFR calc Af Amer: >60 mL/min (ref >60)
GFR calc non Af Amer: >60 mL/min (ref >60)
Glucose, Bld: 99 mg/dL (ref 65–99)
Potassium: 4 mmol/L (ref 3.5–5.1)
Sodium: 136 mmol/L (ref 135–145)

## 2016-04-04 LAB — GLUCOSE, CAPILLARY
Glucose-Capillary: 113 mg/dL — ABNORMAL HIGH (ref 65–99)
Glucose-Capillary: 115 mg/dL — ABNORMAL HIGH (ref 65–99)
Glucose-Capillary: 126 mg/dL — ABNORMAL HIGH (ref 65–99)
Glucose-Capillary: 184 mg/dL — ABNORMAL HIGH (ref 65–99)

## 2016-04-04 NOTE — Progress Notes (Addendum)
Insurance check completed for Xarelto S/W RAMO @ [place] # [phone]   XARELTO 20 MG DAILY 30/30 TAB   COVER- YES  CO-PAY-  $37 copay at preferred pharmacies ($47-non preferred) [place]

## 2016-04-04 NOTE — Progress Notes (Signed)
   Subjective:  No acute events overnight. He denies chest pain, shortness of breath, light headedness, or bleeding.   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 Tissue will be sent for further molecular testing which will take a few days. Discussed with pulmonary and location is not favorable for biopsy via bronchoscopy and would require EBUS.   Objective:  Vital signs in last 24 hours: Vitals:   04/03/16 0845 04/03/16 1330 04/03/16 1957 04/04/16 0648  BP: (!) 143/86 (!) 131/91 118/72 123/78  Pulse: 85 100 90 84  Resp: '18 18 18 18  '$ Temp: 97.8 F (36.6 C) 97.8 F (36.6 C) 97.4 F (36.3 C) 98.4 F (36.9 C)  TempSrc: Oral Oral Oral Oral  SpO2: 94% 92% 93% 94%  Weight:    227 lb 1.2 oz (103 kg)  Height:       Constitutional: NAD, vs reviewed CV: RRR, no murmurs, rubs or gallops appreciated, no LE edema, pulses intact Resp: CTAB, no increased work of breathing, no crackles or wheezing appreciated today.  Abd: soft, distended, nontende, +BS MSK: moves all extremities freely Neuro: Strength and sensation intact. CN 2-12 intact  Assessment/Plan:  Active Problems:   Pulmonary embolus (HCC)   Lung mass   Breast nodule   Adrenal mass, left (HCC)   HTN (hypertension), benign   Malignancy ([place])  Probable malignancy suspicious for metastasis:  Patient with 2 large right lung masses, a right breast mass, and a left adrenal mass concerning for malignancy. Patient has risk factor for pulmonary malignancy in setting of long term tobacco abuse. Core biopsy of breast nodule was obtained. We discussed with pathology who stated tissue showed malignant transformation. Further stains and studies will be done to determine etiology of the malignancy but most likely will be lung. Patient had apparently had at least one melanoma skin lesion removed about 3 years ago. MRI brain does not show brain mets.  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 Tissue will be sent for further molecular testing which will take a few days. Patient agreeable to lung mass biopsy per IR. --IR consulted - appreciate their help --will set up for outpatient radiation and oncology f/u in 1-2 weeks.  Pulmonary embolism: Patient had incidental finding of PE on CTA with no associated symptoms of shortness of breath, pleuritic chest pain, or tachycardia. CTA was reviewed with radiologist and is a small central filling defect within right middle lobe pulm artery. Patient received two doses of therapeutic lovenox before biopsy; was restarted afterward on heparin for possible need for further procedures. Once it as ascertained that tissue sample was adequate, patient was transitioned off of heparin drip and onto Xarelto.  --hold xarelto for biopsy, restart once bleeding risk is low --will need xarelto '15mg'$  BID x 21 days then '20mg'$  daily  Post-obstructive Pneumonia: Patient with findings on CTA consistent with post-obstructive pneumonia vs malignant infiltrate. Will continue treating with antibiotics for the time being.  Switch to levaquin '750mg'$  day 5 of 10  HTN: Patient with history of hypertension managed with amlodipine 2.'5mg'$  daily, metoprolol succinate '50mg'$  daily, and telmisartan-HCTZ 80-'25mg'$  daily. Will continue to monitor for re-introduction of telmisartan-HCTZ. --Amlodipine 2.'5mg'$  daily --metoprolol succinate '50mg'$  daily  T2DM: --SSI-s  HLD: --simvastatin '20mg'$  daily  Dispo: Anticipated discharge 1-2 days.   Alphonzo Grieve, MD 04/04/2016, 7:06 AM Pager [phone]

## 2016-04-04 NOTE — Care Management Note (Signed)
Case Management Note Marvetta Gibbons RN, BSN Unit 2W-Case Manager [phone]  Patient Details  Name: Darrell Christensen MRN: [id number] Date of Birth: 1945/07/10  Subjective/Objective:  Pt admitted with PE                  Action/Plan: PTA pt lived at home alone, has a daughter and brother for support- plan to return home- referral received for Xarelto- per benefits check copay $37 for in network pharmacy. Spoke with pt at bedside- coverage info shared- and 30 day free card given for Xarelto.   Expected Discharge Date:                  Expected Discharge Plan:  Home/Self Care  In-House Referral:     Discharge planning Services  CM Consult, Medication Assistance  Post Acute Care Choice:    Choice offered to:     DME Arranged:    DME Agency:     HH Arranged:    HH Agency:     Status of Service:  In process, will continue to follow  If discussed at Long Length of Stay Meetings, dates discussed:    Additional Comments:  Dawayne Patricia, RN 04/04/2016, 3:04 PM

## 2016-04-04 NOTE — Progress Notes (Signed)
Progress notes  medicine attending: I examined this patient today together with resident physician Dr. Alphonzo Grieve and I concur with her evaluation and management plan which we discussed together. Special stains on the right breast biopsy do not reveal an obvious primary source for the tumor. This is a poorly differentiated spindle cell neoplasm which stains negative for cytokeratin (epithelial) and melanoma markers. This is a highly unlikely presentation for a sarcoma. The tissue blocks will be submitted for molecular analysis. This may take 2 weeks. I'm concerned that there may be a discordance between the neoplasm in the breast tissue and that in the lung. We have arranged for a transcutaneous needle biopsy of the lung mass for tomorrow. He was started back on anticoagulation yesterday with Xarelto with the plan for a loading dose 15 mg twice daily for 3 weeks then 20 mg daily maintenance dose. In view of need for additional biopsy material, we will hold the Xarelto. He received a 15 mg dose this morning. He has normal renal function with estimated GFR greater than 60 mL/m. The majority of the drug should be out of his system within 24 hours. I discussed this with our interventional radiologist. I feel it is safe to proceed with the biopsy tomorrow afternoon to allow approximately 24 hours off drug. We discussed this plan with the patient who is agreeable and Dr. Jari Favre also spoke with his daughter by phone. I discussed this case with my radiation oncology and medical oncology colleagues. In view of the posterior location of this tumor and his current symptoms with pain between his scapulae, I am concerned with potential for cord compromise if the lesion continues to grow. Once additional biopsy material is obtained, he will be referred for radiation. Subsequent management by medical oncology depending on final pathology. They will expedite his evaluation and present his case at the thoracic oncology  conference next week.

## 2016-04-04 NOTE — Progress Notes (Signed)
Subjective: Patient reports no SOB, chest pain and controlled back pain. Time was spent discussing results of MRI and biopsy. Patient was at first nervous about procedure because of poor outcome of father with a similar procedure. At end of conversation, patient agreed to plan and was less anxious.  Objective: Vital signs in last 24 hours: Vitals:   04/03/16 0845 04/03/16 1330 04/03/16 1957 04/04/16 0648  BP: (!) 143/86 (!) 131/91 118/72 123/78  Pulse: 85 100 90 84  Resp: '18 18 18 18  '$ Temp: 97.8 F (36.6 C) 97.8 F (36.6 C) 97.4 F (36.3 C) 98.4 F (36.9 C)  TempSrc: Oral Oral Oral Oral  SpO2: 94% 92% 93% 94%  Weight:    103 kg (227 lb 1.2 oz)  Height:       Weight change: 0 kg (0 lb) No intake or output data in the 24 hours ending 04/04/16 1315 Gen: Patient is well appearing, lying in bed, fully conversant with team, Oriented x 3. HEENT: normocephalic and atraumatic. Pupils are equally round and reactive to light. Moist mucus membranes. Anicteric. No oral lesions present. Tongue is enlarged. No Facial Edema. No Thyriodmegaly. Lymphatics: enlarged R submandibular node. no lymphadenopathy present posterior cervical, posterior auricular, occipital, periauricular, parotid,submental. No axillary, supraclavicular lymphadenopathy present.  Cv: No sternal scars present. Regular rate and rhythm. NL s1 s2 with no heaves rubs or murmurs. JVP present to 2 cms above the clavicle at 30 degrees Vascular: Radial, dorsalis pedis and posterior tibial pulses 2+.  Resp: No scars present on back. Normal work of breathing. Clear to ascultation bilaterally. Tympanic percussion throughout.  Abdomen: No abdominal scars present. Distended. Normal Bowel sounds. Nontender to palpitation. Normal percussion throughout. No organomegaly or masses.  Neuro: Oriented x3. EOM intact. Facial motor symmetry. Facial sensation is symmetric. Uvula and tongue are midline. Grossly normal strength throughout. Sensation is  symmetric throughout. Finger to nose and heel to shin are normal.  Ext: Warm extremities. No LE edema. No cyanosis orclubbing. No rashes present. MSK: No obvious deformed joints. Palpable mobile and soft ~2x2 cm R breast nodule present just lateral to the nipple. No shoulder pain orarm painwith motion. No proximal muscle weakness.  Lab Results: BMP notable for a CR of 1.17.  Micro Results: No results found for this or any previous visit (from the past 240 hour(s)). Studies/Results: Preliminary biopsy of R breast nodule show poorly differentiated spindle cells suggestive of a malignant cancer. 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 Negative for S-100. Official results are not yet released  MRI Head w/w/o Contrast -  No acute and intracranial process or metastasis. Moderate chronic small vessel ischemic disease. See note for formal report.  CXR - Pa, consolidation in medial right upper lope that disrupts the right heart border. This may be due to hilar lymphadenopathy. On lateral, mass in pressingtrachea and pressing against spine. See imagining note for formal report  CTA - Two 50 x 50 mm masses present in the RUL. Right breast mass also present. Adrenal mass also noted. Enlarged nodes and other nodules present. Filling defect within right middle lobe pulmonary artery concerning for pulmonary embolism. See imagining note for formal report.  Medications: I have reviewed the patient's current medications. Scheduled Meds: . amLODipine  2.5 mg Oral Daily  . docusate sodium  100 mg Oral BID  . insulin aspart  0-15 Units Subcutaneous TID WC  . levofloxacin  750 mg Oral q1800  . metoprolol succinate  25 mg Oral QPM  . senna  1 tablet Oral BID  . simvastatin  20 mg Oral QPM  . sodium chloride flush  3 mL Intravenous Q12H   Continuous Infusions: PRN Meds:.acetaminophen **OR** acetaminophen, ALPRAZolam, diclofenac sodium,  ondansetron **OR** ondansetron (ZOFRAN) IV, oxyCODONE Assessment/Plan: Active Problems:   Pulmonary embolus (HCC)   Lung mass   Breast nodule   Adrenal mass, left (HCC)   HTN (hypertension), benign   Malignancy (HCC)  Bruso, Jarry is a 70 yo M with a smoking history of 50 pack years, a family history of lung cancer in his father and brother and a PMH HTN, T2DM, melanoma removal 3 years ago and h/o alcohol abuse who present to the ED with intermittent chest pain for years and thoracic back pain for 1 month who was found to have R lung masses.   Possible Malignancy of unknown origin Patient has had thoracic back pain for about 1 month with intermittent right lower chest pain for years. During evaluation in the Ed, CXR was notable for mass in the RUL. CTA was notable for two masses in the RUL measuring 54 x 56 and 51 x 49 mm adjacent to the bronchi and spine. Other nodes were also present: Hilar node, tracheal node, L upper pulmonary nodule, subpleural nodules and few pleural based calcifications in left hemithorax. Right breast nodule and left adrenal nodule. Patient does notexhibit signs of horner syndrome. No signs of SVC syndrome with no facial edema or significant JVP. No signs of Rita Ohara as well.   - Preliminary results of right breast nodule with core needle biopsy - suggest a malignancy most consistent with a sarcoma with no evidence epithelial origin. Unlikely related to lung process. Also negative for S-100. Official results are not yet released.   - MRI Head w w/o C for brain mets negative for acute intracranial process  - Pulmonary consulted and suggested transthoracic biopsy would result better biopsy than bronchoscope  - IR consulted and agreed to perform procedure tomorrow, 24 hours post rivaroxaban dose  - Acetaminophen 650 mg q6 PRN - oxycodone '5mg'$  q4 PRN  Obstructive Pneumonia- Patient's tachycardia and elevated WBC may be consistent with an obstructive pneumonia  due to malignancy. However, patient remains afebrile. Ttreated with Azi+Ceftriax x 4 days to cover for CAP.  Will switch to oral for home Abx   - Levofloxacin 750 mg PO x 6 days  Pulmonary embolism likely associated with malignancyPatient wastachycardic and satting in mid 90's without oxygen requirement. No complaints of SOB or hemoptysis. CTA consistent with pulmonary embolus within right middle lobe pulmonary artery. Initially received Lovenox for one day. Withheld 2/2 to biopsy. Continued with unfractionated heparin until determined no need for quick reversal. Switched to Xarelto 15 mg BID x 21 days, then '20mg'$  qd.   - Stop Xarelto for biopsy tomorrow  HTN Managed with amlodipine 2.'5mg'$  qd, metoprolol succinate '50mg'$  qd, and telmisartan-HCTZ 80-'25mg'$  qd at home  - Amlodipine 2.5 mg qd - metoprolol 50 mg qd  Anxiety  - Alprazolam 1 mg qhs oral  T2DM: Patient with T2DM on glipizide '5mg'$  daily and metformin '500mg'$  BID. - Hold oral meds - SSI-S  HLD: Patient with hyperlipidemia on simvastain '20mg'$  daily.  - continue home regimem  FENGI Patient reports no trouble with constipation or abdominal pain/nausea.  - HH diet - colace 100 mg BID PO - senna 8.6 mg BID PO - ondansetron '4mg'$  q6 PRN PO DVT: hold   CODE - Full  Dispo  - Per biopsy results  - f/u  PET scan and oncology with path report   This is a Careers information officer Note.  The care of the patient was discussed with Dr. Wynetta Emery and the assessment and plan formulated with their assistance.  Please see their attached note for official documentation of the daily encounter.   LOS: 4 days   Benn Moulder, Medical Student 04/04/2016, 1:15 PM

## 2016-04-04 NOTE — Progress Notes (Signed)
Chief Complaint: Patient was seen in consultation today for biopsy of chest wall mass at the request of Dr. Murriel Hopper  Referring Physician(s): Dr. Murriel Hopper  Supervising Physician: Corrie Mckusick  Patient Status: [facility] - In-pt  History of Present Illness: Darrell Christensen is a 70 y.o. male admitted and found to have RUL mass and small Right PE. He was started on Lovenox, has been converted to Xarelto with doses last pm and this am. Had biopsy of right chest/breast nodule. Prelim path suspicious for spindle cell neoplasm. D/w Dr. Beryle Beams, feels this may not be consistent with lesions in the lung. Now requests lung mass biopsy. Chart, imaging, meds, labs, allergies reviewed.  Past Medical History:  Diagnosis Date  . Adrenal mass, left ([place]) 04/01/2016  . Diabetes mellitus without complication ([place])   . Hypertension   . Mass of breast, right 04/01/2016  . Mass of upper lobe of right lung 04/01/2016   2 adjacent, 5 cm masses, hilar adenopathy    History reviewed. No pertinent surgical history.  Allergies: Patient has no known allergies.  Medications:  Current Facility-Administered Medications:  .  acetaminophen (TYLENOL) tablet 650 mg, 650 mg, Oral, Q6H PRN, 650 mg at 04/04/16 0028 **OR** acetaminophen (TYLENOL) suppository 650 mg, 650 mg, Rectal, Q6H PRN, Burgess Estelle, MD .  ALPRAZolam [name]) tablet 1 mg, 1 mg, Oral, QHS PRN, Burgess Estelle, MD, 1 mg at 04/04/16 0028 .  amLODipine (NORVASC) tablet 2.5 mg, 2.5 mg, Oral, Daily, Burgess Estelle, MD, 2.5 mg at 04/04/16 0949 .  diclofenac sodium (VOLTAREN) 1 % transdermal gel 4 g, 4 g, Topical, QID PRN, Alphonzo Grieve, MD .  docusate sodium (COLACE) capsule 100 mg, 100 mg, Oral, BID, Burgess Estelle, MD .  insulin aspart (novoLOG) injection 0-15 Units, 0-15 Units, Subcutaneous, TID WC, Burgess Estelle, MD, 2 Units at 04/03/16 1650 .  levofloxacin (LEVAQUIN) tablet 750 mg, 750 mg, Oral, q1800, Alphonzo Grieve, MD .   metoprolol succinate (TOPROL-XL) 24 hr tablet 25 mg, 25 mg, Oral, QPM, Burgess Estelle, MD, 25 mg at 04/03/16 1647 .  ondansetron (ZOFRAN) tablet 4 mg, 4 mg, Oral, Q6H PRN **OR** ondansetron (ZOFRAN) injection 4 mg, 4 mg, Intravenous, Q6H PRN, Burgess Estelle, MD .  oxyCODONE (Oxy IR/ROXICODONE) immediate release tablet 5 mg, 5 mg, Oral, Q4H PRN, Burgess Estelle, MD, 5 mg at 04/01/16 1457 .  senna (SENOKOT) tablet 8.6 mg, 1 tablet, Oral, BID, Burgess Estelle, MD, 8.6 mg at 04/04/16 0949 .  simvastatin (ZOCOR) tablet 20 mg, 20 mg, Oral, QPM, Alphonzo Grieve, MD, 20 mg at 04/03/16 1648 .  sodium chloride flush (NS) 0.9 % injection 3 mL, 3 mL, Intravenous, Q12H, Burgess Estelle, MD, 3 mL at 04/04/16 4496    History reviewed. No pertinent family history.  Social History   Social History  . Marital status: Divorced    Spouse name: N/A  . Number of children: N/A  . Years of education: N/A   Social History Main Topics  . Smoking status: Former Research scientist (life sciences)  . Smokeless tobacco: Never Used  . Alcohol use None  . Drug use: Unknown  . Sexual activity: Not Asked   Other Topics Concern  . None   Social History Narrative  . None    Review of Systems: A 12 point ROS discussed and pertinent positives are indicated in the HPI above.  All other systems are negative.  Review of Systems  Vital Signs: BP 123/78 (BP Location: Left Arm)   Pulse 84  Temp 98.4 F (36.9 C) (Oral)   Resp 18   Ht '5\' 9"'$  (1.753 m)   Wt 227 lb 1.2 oz (103 kg)   SpO2 94%   BMI 33.53 kg/m   Physical Exam  Constitutional: He is oriented to person, place, and time. He appears well-developed and well-nourished. No distress.  HENT:  Head: Normocephalic.  Mouth/Throat: Oropharynx is clear and moist.  Neck: Normal range of motion. No JVD present.  Cardiovascular: Normal rate, regular rhythm and normal heart sounds.   Pulmonary/Chest: Effort normal and breath sounds normal. No respiratory distress.  right chest/breast nodule/mass  biopsy site clean, NT  Neurological: He is alert and oriented to person, place, and time.  Psychiatric: He has a normal mood and affect. Judgment normal.    Mallampati Score:  MD Evaluation Airway: WNL Heart: WNL Abdomen: WNL Chest/ Lungs: WNL ASA  Classification: 3 Mallampati/Airway Score: Two  Imaging: Dg Chest 2 View  Result Date: 03/31/2016 CLINICAL DATA:  Right-sided chest pain EXAM: CHEST  2 VIEW COMPARISON:  05/08/2011 FINDINGS: Cardiac shadow is mildly enlarged. There is increased density projecting in the superior segment of the right lower lobe consistent with pneumonia. Fullness in the hilar region is noted as well as in the right peritracheal region. No other focal abnormality is seen. No bony abnormality is noted. IMPRESSION: Changes in the superior segment of the right lower lobe likely related to acute pneumonia and reactive lymphadenopathy. Followup PA and lateral chest X-ray is recommended in 3-4 weeks following trial of antibiotic therapy to ensure resolution and exclude underlying malignancy. Electronically Signed   By: Inez Catalina M.D.   On: 03/31/2016 11:39   Ct Angio Chest Pe W/cm &/or Wo Cm  Result Date: 03/31/2016 CLINICAL DATA:  Patient with intermittent chest pain. Consolidation within the right lung on recent chest radiograph. EXAM: CT ANGIOGRAPHY CHEST WITH CONTRAST TECHNIQUE: Multidetector CT imaging of the chest was performed using the standard protocol during bolus administration of intravenous contrast. Multiplanar CT image reconstructions and MIPs were obtained to evaluate the vascular anatomy. CONTRAST:  100 cc Isovue 370 COMPARISON:  Chest radiograph 03/31/2016; 05/08/2011. FINDINGS: Cardiovascular: Normal heart size. No pericardial effusion. Coronary arterial vascular calcifications. Aorta and main pulmonary artery are normal in caliber. The right upper lobe pulmonary arteries are attenuated coursing over the superior aspect of the large right upper lobe  area of consolidation. Within a right middle lobe pulmonary artery (image 145; series 607) there is a thin central filling defect. Mediastinum/Nodes: No axillary lymphadenopathy. There is a 2.6 cm right hilar lymph node (image 51; series 601). There is a 1.9 cm right peritracheal lymph node (image 40; series 601). Multiple prominent sub cm right peritracheal lymph nodes are demonstrated. Normal appearance of the esophagus. Lungs/Pleura: Expiratory phase imaging. 7 mm left upper lobe pulmonary nodule (image 54; series 606). Dependent atelectasis within the lower lobes bilaterally. 5 mm subpleural right upper lobe nodule (image 55; series 606). Two adjacent large masslike areas of consolidation within the right upper lobe measuring 5.4 x 5.6 cm (image 101; series 607) and 5.1 x 4.9 cm (image 136; series 607). Few pleural based calcifications within the left hemi thorax. Upper Abdomen: Liver is diffusely low in attenuation. Indeterminate 2.9 x 1.7 cm left adrenal nodule. Normal morphology of the stomach. Musculoskeletal: Healed posterior right twelfth rib fracture. Thoracic spine degenerative changes. No aggressive or acute appearing osseous lesions. Marked degenerative changes involving the right shoulder joint. There is a 1.8 cm solid nodule within the  right breast (image 62; series 601). Indeterminate 1.1 cm low-attenuation nodule within the right thyroid lobe. Review of the MIP images confirms the above findings. IMPRESSION: Small central filling defect within a right middle lobe pulmonary artery concerning for pulmonary embolism. Two adjacent large masslike areas of consolidation within the right upper lobe with differential considerations including malignancy or pneumonia. Multiple enlarged right hilar and mediastinal lymph nodes which may be reactive or metastatic in etiology. Additional indeterminate pulmonary nodules as above. Recommend attention on follow-up. Indeterminate 2.9 cm left adrenal nodule. This  needs dedicated evaluation with pre and post contrast-enhanced CT or MRI after confirmation of pulmonary process. Indeterminate nodule within the right breast. Recommend dedicated evaluation with mammography. Hepatic steatosis. Critical Value/emergent results were called by telephone at the time of interpretation on 03/31/2016 at 2:49 pm to Dr. Lita Mains , who verbally acknowledged these results. Electronically Signed   By: Lovey Newcomer M.D.   On: 03/31/2016 14:54   Mr Jeri Cos BH Contrast  Result Date: 04/03/2016 CLINICAL DATA:  Suspected primary lung cancer with breast mass and adrenal mass. Evaluate for intracranial metastasis. History of hypertension, diabetes. EXAM: MRI HEAD WITHOUT AND WITH CONTRAST TECHNIQUE: Multiplanar, multiecho pulse sequences of the brain and surrounding structures were obtained without and with intravenous contrast. CONTRAST:  67m MULTIHANCE GADOBENATE DIMEGLUMINE 529 MG/ML IV SOLN COMPARISON:  MRI head Oct 03, 2003 FINDINGS: INTRACRANIAL CONTENTS: No reduced diffusion to suggest acute ischemia, or hypercellular tumor. No susceptibility artifact to suggest hemorrhage. Symmetric basal ganglia mineralization. The ventricles and sulci are normal for patient's age. No suspicious parenchymal signal, masses, mass effect. Patchy supratentorial white matter in pontine FLAIR T2 hyperintensities. No abnormal intraparenchymal or extra-axial enhancement. No abnormal extra-axial fluid collections. No extra-axial masses. Small posterior fossa arachnoid cyst without mass effect. VASCULAR: Normal major intracranial vascular flow voids present at skull base. SKULL AND UPPER CERVICAL SPINE: No abnormal sellar expansion. Bright T1 bone marrow signal most compatible with osteopenia. No suspicious calvarial bone marrow signal. Craniocervical junction maintained. SINUSES/ORBITS: The mastoid air-cells and included paranasal sinuses are well-aerated.The included ocular globes and orbital contents are  non-suspicious. OTHER: None. IMPRESSION: No acute and intracranial process or metastasis. Moderate chronic small vessel ischemic disease. Electronically Signed   By: CElon AlasM.D.   On: 04/03/2016 21:51   [place]  Result Date: 04/02/2016 CLINICAL DATA:  Large right upper lobe lung mass with subcutaneous soft tissue nodule in the right chest wall and left adrenal mass. The patient presents for biopsy of the right chest wall mass. EXAM: ULTRASOUND GUIDED CORE BIOPSY OF RIGHT CHEST WALL MASS MEDICATIONS: 2.0 mg IV Versed; 100 mcg IV Fentanyl Total Moderate Sedation Time: 15 minutes The patient's level of consciousness and physiologic status were continuously monitored during the procedure by Radiology nursing. PROCEDURE: The procedure, risks, benefits, and alternatives were explained to the patient. Questions regarding the procedure were encouraged and answered. The patient understands and consents to the procedure. A time out was performed prior to initiating the procedure. The right chest wall was prepped with chlorhexidine in a sterile fashion, and a sterile drape was applied covering the operative field. A sterile gown and sterile gloves were used for the procedure. Local anesthesia was provided with 1% Lidocaine. Subcutaneous right chest wall nodule was localized by ultrasound. Core biopsy was performed with a 16 gauge device. A total of 4 separate core biopsy samples were obtained and submitted in formalin. COMPLICATIONS: None. FINDINGS: Oval-shaped hypoechoic solid soft tissue nodule was localized in  the subcutaneous fat of the right chest wall measuring approximately 1.5 cm in greatest diameter. Solid tissue was obtained. IMPRESSION: Ultrasound-guided core biopsy performed of a solid 1.5 cm soft tissue nodule in the right chest wall. Electronically Signed   By: Aletta Edouard M.D.   On: 04/02/2016 11:39    Labs:  CBC:  Recent Labs  03/31/16 1145 04/01/16 0424 04/02/16 0248  04/03/16 0007  WBC 16.4* 14.3* 13.6* 14.7*  HGB 14.8 13.6 13.7 13.3  HCT 43.3 40.5 41.0 40.2  PLT 327 302 321 336    COAGS:  Recent Labs  04/01/16 0424  INR 1.13    BMP:  Recent Labs  04/01/16 0424 04/02/16 0248 04/03/16 0007 04/04/16 0232  NA 138 138 137 136  K 4.1 3.6 4.2 4.0  CL 103 104 103 105  CO2 '25 24 24 22  '$ GLUCOSE 98 130* 116* 99  BUN '16 20 20 17  '$ CALCIUM 8.8* 8.7* 8.6* 8.6*  CREATININE 1.39* 1.32* 1.52* 1.17  GFRNONAA 49* 53* 44* >60  GFRAA 57* >60 51* >60    LIVER FUNCTION TESTS:  Recent Labs  03/31/16 1612 04/01/16 0424  BILITOT 0.6 0.5  AST 13* 12*  ALT 14* 14*  ALKPHOS 72 64  PROT 6.9 6.2*  ALBUMIN 3.7 3.3*    TUMOR MARKERS: No results for input(s): AFPTM, CEA, CA199, CHROMGRNA in the last 8760 hours.  Assessment and Plan: Right lung mass Right chest wall/breast mass Biopsy of right chest/breast pathology possibly unrelated to lung process.  Plan to move fwd with lung mass biopsy. Xarelto has been started with low dose given this morning. Should be clear to proceed by tomorrow. Risks and Benefits discussed with the patient including, but not limited to bleeding, hemoptysis, respiratory failure requiring intubation, infection, pneumothorax requiring chest tube placement, stroke from air embolism or even death. All of the patient's questions were answered, patient is agreeable to proceed. Consent signed and in chart.    Electronically Signed: Ascencion Dike 04/04/2016, 1:10 PM   I spent a total of 20 minutes in face to face in clinical consultation, greater than 50% of which was counseling/coordinating care for lung mass biopsy

## 2016-04-04 NOTE — Care Management Important Message (Signed)
Important Message  Patient Details  Name: Darrell Christensen MRN: [id number] Date of Birth: Sep 13, 1945   Medicare Important Message Given:  Yes    [name] Abena 04/04/2016, 10:05 AM

## 2016-04-05 ENCOUNTER — Inpatient Hospital Stay (HOSPITAL_COMMUNITY): Payer: Medicare Other

## 2016-04-05 DIAGNOSIS — I10 Essential (primary) hypertension: Secondary | ICD-10-CM

## 2016-04-05 DIAGNOSIS — Z794 Long term (current) use of insulin: Secondary | ICD-10-CM

## 2016-04-05 DIAGNOSIS — Z9889 Other specified postprocedural states: Secondary | ICD-10-CM

## 2016-04-05 DIAGNOSIS — E119 Type 2 diabetes mellitus without complications: Secondary | ICD-10-CM

## 2016-04-05 DIAGNOSIS — I2699 Other pulmonary embolism without acute cor pulmonale: Principal | ICD-10-CM

## 2016-04-05 DIAGNOSIS — E785 Hyperlipidemia, unspecified: Secondary | ICD-10-CM

## 2016-04-05 DIAGNOSIS — R918 Other nonspecific abnormal finding of lung field: Secondary | ICD-10-CM

## 2016-04-05 DIAGNOSIS — J189 Pneumonia, unspecified organism: Secondary | ICD-10-CM

## 2016-04-05 DIAGNOSIS — C50921 Malignant neoplasm of unspecified site of right male breast: Secondary | ICD-10-CM

## 2016-04-05 DIAGNOSIS — Z79899 Other long term (current) drug therapy: Secondary | ICD-10-CM

## 2016-04-05 LAB — BASIC METABOLIC PANEL
Anion gap: 14 (ref 5–15)
BUN: 15 mg/dL (ref 6–20)
CO2: 20 mmol/L — ABNORMAL LOW (ref 22–32)
Calcium: 9.3 mg/dL (ref 8.9–10.3)
Chloride: 103 mmol/L (ref 101–111)
Creatinine, Ser: 1.16 mg/dL (ref 0.61–1.24)
GFR calc Af Amer: >60 mL/min (ref >60)
GFR calc non Af Amer: >60 mL/min (ref >60)
Glucose, Bld: 122 mg/dL — ABNORMAL HIGH (ref 65–99)
Potassium: 4.3 mmol/L (ref 3.5–5.1)
Sodium: 137 mmol/L (ref 135–145)

## 2016-04-05 LAB — GLUCOSE, CAPILLARY
Glucose-Capillary: 123 mg/dL — ABNORMAL HIGH (ref 65–99)
Glucose-Capillary: 107 mg/dL — ABNORMAL HIGH (ref 65–99)
Glucose-Capillary: 137 mg/dL — ABNORMAL HIGH (ref 65–99)
Glucose-Capillary: 111 mg/dL — ABNORMAL HIGH (ref 65–99)

## 2016-04-05 LAB — PROTIME-INR
INR: 1.36
Prothrombin Time: 16.9 seconds — ABNORMAL HIGH (ref 11.4–15.2)

## 2016-04-05 MED ORDER — LIDOCAINE HCL 1 % IJ SOLN
INTRAMUSCULAR | Status: AC
  Filled 2016-04-05: qty 20

## 2016-04-05 MED ORDER — RIVAROXABAN 15 MG PO TABS: 15.0000 mg | ORAL_TABLET | Freq: Two times a day (BID) | ORAL | Status: DC

## 2016-04-05 MED ORDER — FENTANYL CITRATE (PF) 100 MCG/2ML IJ SOLN
INTRAMUSCULAR | Status: AC
  Filled 2016-04-05: qty 4

## 2016-04-05 MED ORDER — MIDAZOLAM HCL 2 MG/2ML IJ SOLN
INTRAMUSCULAR | Status: AC
  Filled 2016-04-05: qty 4

## 2016-04-05 MED ORDER — MIDAZOLAM HCL 2 MG/2ML IJ SOLN
INTRAMUSCULAR | Status: AC | PRN
  Administered 2016-04-05 (×2): 1 mg via INTRAVENOUS

## 2016-04-05 MED ORDER — FENTANYL CITRATE (PF) 100 MCG/2ML IJ SOLN
INTRAMUSCULAR | Status: AC | PRN
  Administered 2016-04-05: 50 ug via INTRAVENOUS

## 2016-04-05 MED ORDER — RIVAROXABAN 15 MG PO TABS: 15.0000 mg | ORAL_TABLET | Freq: Every day | ORAL | Status: DC

## 2016-04-05 MED ORDER — RIVAROXABAN 15 MG PO TABS
15.0000 mg | ORAL_TABLET | Freq: Two times a day (BID) | ORAL | Status: DC
  Administered 2016-04-06: 15 mg via ORAL
  Filled 2016-04-05: qty 1

## 2016-04-05 MED ORDER — RIVAROXABAN 20 MG PO TABS: 20.0000 mg | ORAL_TABLET | Freq: Every day | ORAL | Status: DC

## 2016-04-05 NOTE — Progress Notes (Signed)
Internal Medicine Attending:   I saw and examined the patient. I reviewed the resident's note and I agree with the resident's findings and plan as documented in the resident's note. Patient is now status post IR guided biopsy of right lung mass. He feels well and wants to go home soon. No shortness of breath, no chest pain. 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 Biopsy of lung mass done today to clarify etiology of tumor and to ensure that this is not a separate process from his chest wall mass. Patient will need outpatient follow-up with oncology and radiation oncology. We will follow-up his lung mass biopsy results. Likely discharge home in the morning if he remains stable. We will need to resume xarelto in the a.m. for his recently diagnosed pulmonary embolism. He will also need to complete a ten-day course of Levaquin by mouth for possible postobstructive pneumonia.

## 2016-04-05 NOTE — Sedation Documentation (Signed)
Patient is resting comfortably. 

## 2016-04-05 NOTE — Discharge Instructions (Addendum)
Mr. Groeneveld, Your lung biopsy results will come back next week some time. The oncologist's office should call you next week for an appointment. If you do not hear from them, please call the office at the number listed above. It will be at Dr. Katherene Ponto office.  We are also going to get you set up to be seen by radiation oncologist. They will also be in contact with you. Please call your primary care doctor's (Dr. Gloriann Loan) office and schedule an appointment  Because you had the blood clot in your lungs, we are sending you home on a blood thinner called Xarelto. It is twice a day for the next 3 weeks. See further instructions below.  We are also giving you a prescription for an anabiotic, Levaquin. It will be once a day for the next 4 days starting tomorrow (you already received today's dose before leaving the hospital).  If you have any questions or concerns, please call our clinic at [phone].   If you develop worsening breathing, pain, bleeding, chest pain, cough with blood please return to the ED for evaluation.      Information on my medicine - XARELTO (rivaroxaban)  This medication education was reviewed with me or my healthcare representative as part of my discharge preparation.  WHY WAS XARELTO PRESCRIBED FOR YOU? Xarelto was prescribed to treat blood clots that may have been found in the veins of your legs (deep vein thrombosis) or in your lungs (pulmonary embolism) and to reduce the risk of them occurring again.  What do you need to know about Xarelto? The starting dose is one 15 mg tablet taken TWICE daily with food for the FIRST 21 DAYS then on (enter date)  04/27/2016  the dose is changed to one 20 mg tablet taken ONCE A DAY with your evening meal.  DO NOT stop taking Xarelto without talking to the health care provider who prescribed the medication.  Refill your prescription for 20 mg tablets before you run out.  After discharge, you should have regular check-up appointments  with your healthcare provider that is prescribing your Xarelto.  In the future your dose may need to be changed if your kidney function changes by a significant amount.  What do you do if you miss a dose? If you are taking Xarelto TWICE DAILY and you miss a dose, take it as soon as you remember. You may take two 15 mg tablets (total 30 mg) at the same time then resume your regularly scheduled 15 mg twice daily the next day.  If you are taking Xarelto ONCE DAILY and you miss a dose, take it as soon as you remember on the same day then continue your regularly scheduled once daily regimen the next day. Do not take two doses of Xarelto at the same time.   Important Safety Information Xarelto is a blood thinner medicine that can cause bleeding. You should call your healthcare provider right away if you experience any of the following: ? Bleeding from an injury or your nose that does not stop. ? Unusual colored urine (red or dark brown) or unusual colored stools (red or black). ? Unusual bruising for unknown reasons. ? A serious fall or if you hit your head (even if there is no bleeding).  Some medicines may interact with Xarelto and might increase your risk of bleeding while on Xarelto. To help avoid this, consult your healthcare provider or pharmacist prior to using any new prescription or non-prescription medications, including herbals, vitamins, non-steroidal  anti-inflammatory drugs (NSAIDs) and supplements.  This website has more information on Xarelto: [email or web address]

## 2016-04-05 NOTE — Progress Notes (Addendum)
   Subjective: Patient returned from IR lung biopsy. He denies chest pain and SOB. He feels well and is inquiring about discharge.   Objective:  Vital signs in last 24 hours: Vitals:   04/05/16 1044 04/05/16 1047 04/05/16 1054 04/05/16 1115  BP: 128/83 118/79 130/82 110/77  Pulse: 90 92 91 81  Resp: 17 (!) '21 20 18  '$ Temp:      TempSrc:      SpO2: 96% 95% 96% 95%  Weight:      Height:       Physical Exam Constitutional: NAD, appears comfortable Neck: Supple, trachea midline. + submandibular LAD Cardiovascular: RRR, no murmurs, rubs, or gallops.  Pulmonary/Chest: CTAB, no wheezes, rales, or rhonchi.  Abdominal: Soft, non tender, non distended. +BS.  Extremities: Warm and well perfused. Distal pulses intact. No edema.  Neurological: A&Ox3, CN II - XII grossly intact.  Skin: No rashes or erythema  Psychiatric: Normal mood and affect  Assessment/Plan:  Multiple Masses Suspicious for Metastatic Disease: 2 large right lung masses, a right breast mass, and a left adrenal mass concerning for malignancy. Patient has a history of 2 superficial melanomas excised 3 years ago and has not followed up with a dermatologist. He is also a former smoker. Core biopsy of the breast mass was obtained and pathology was consistent with a spindle cell morphology. Cells stains were positive for smooth muscle actin and desmin. Results have been discussed at length with Dr. Beryle Beams who is concerned that the lung masses may represent a separate process from the chest wall mass, and patient went for CT guided biopsy of his lung mass this morning. Additionally, there is concern for cord compression given the posterior location of the tumor if it continues to grow. Oncology is planning to refer for outpatient radiation therapy. Further management is pending final tissue pathology.  -- Follow up lung mass biopsy results -- Oncology following, appreciate recommendations  -- Plan for outpatient radiation and  oncology follow up in 1-2 weeks  -- Likely discharge tomorrow   Pulmonary Embolism: Likely hypercoagulable in the setting of malignancy. Patient had incidental finding of PE on CTA with no associated symptoms of shortness of breath, pleuritic chest pain, or tachycardia. CTA was reviewed with radiologist and there is a small central filling defect within right middle lobe pulm artery. Patient was started on heparin and transitioned to xarelto x 2 doses which was held for his biopsy today.  -- Restart xarelto tomorrow; 15 mg BID x 21 days then 20 mg daily    Post-obstructive Pneumonia: Patient with findings on CTA consistent with post-obstructive pneumonia vs malignant infiltrate. Will continue treating with antibiotics for the time being.  -- Levaquin '750mg'$  day 6 of 10  HTN: On amlodipine 2.'5mg'$  daily, metoprolol succinate '50mg'$  daily, and telmisartan-HCTZ 80-'25mg'$  daily at home. Holding telmisartan-HCTZ. -- Amlodipine 2.'5mg'$  daily -- metoprolol succinate '50mg'$  daily  DM II: -- SSI    HLD: --simvastatin '20mg'$  daily  FEN: No fluids, replete lytes prn, carb modified diet VTE ppx: SCDs today, restart xarelto tomorrow  Code Status: FULL   Dispo: Anticipated discharge tomorrow.   Velna Ochs, MD 04/05/2016, 12:31 PM Pager: [phone]

## 2016-04-05 NOTE — Progress Notes (Signed)
Heme-Onc: protime 16.9 seconds @ 8:58 AM. Result not linear with Xarelto w respect to degree of anticoagulation - best for negative predictive value. Recommend repeat stat PT at 1 PM. If protime >/= 15.3 seconds, reschedule lung bx. Start UFH over weekend. Hold Xarelto. Renal function stable. Discussed w Dr Charlynn Grimes. 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 I still feel bx of dominant lung mass indicated.       Murriel Hopper, MD, [place]  Hematology-Oncology/Internal Medicine

## 2016-04-05 NOTE — Procedures (Signed)
Interventional Radiology Procedure Note  Procedure: CT guided biopsy of RUL mass.  Biosentry deployed Complications: None Recommendations: - Bedrest until CXR cleared.  Minimize talking, coughing or otherwise straining.  - Follow up 1 hr CXR pending   Signed,  Cashlynn Yearwood S. Earleen Newport, DO

## 2016-04-05 NOTE — Progress Notes (Signed)
Subjective: Patient reports no pain after the procedure. Patient understands plan moving forward and looks forward for discharge tomorrow. Objective: Vital signs in last 24 hours: Vitals:   04/05/16 1047 04/05/16 1054 04/05/16 1115 04/05/16 1329  BP: 118/79 130/82 110/77 132/87  Pulse: 92 91 81 95  Resp: (!) '21 20 18 18  '$ Temp:      TempSrc:      SpO2: 95% 96% 95% 94%  Weight:      Height:       Weight change: 12.9 kg (28 lb 6.8 oz)  Intake/Output Summary (Last 24 hours) at 04/05/16 1436 Last data filed at 04/04/16 1630  Gross per 24 hour  Intake              240 ml  Output                0 ml  Net              240 ml   Gen: Patient is well appearing, lying in bed, fully conversant with team, Oriented x 3. HEENT: normocephalic and atraumatic. Pupils are equally round and reactive to light. Moist mucus membranes. Anicteric. No oral lesions present. Tongue is enlarged. No Facial Edema. No Thyriodmegaly. Lymphatics: enlarged R submandibular node. no lymphadenopathy present posterior cervical, posterior auricular, occipital, periauricular, parotid,submental. No axillary, supraclavicular lymphadenopathy present.  Cv: Regular rate and rhythm. NL s1 s2 with no heaves rubs or murmurs.  Vascular: Radial, dorsalis pedis and posterior tibial pulses 2+.  Resp: . Normal work of breathing. Clear to ascultation bilaterally.  Abdomen: No abdominal scars present. Distended. Normal Bowel sounds. Nontender to palpitation. Normal percussion throughout. No organomegaly or masses.  Neuro: Oriented x3. EOM intact. Facial motor symmetry. Facial sensation is symmetric. Uvula and tongue are midline. Grossly normal strength throughout. Sensation is symmetric throughout. Finger to nose and heel to shin are normal.  Ext: Warm extremities. No LE edema. No cyanosis orclubbing. No rashes present. MSK: No obvious deformed joints. Palpable mobile and soft ~2x2 cm R breast nodule present just lateral to the nipple.  No shoulder pain orarm painwith motion. No proximal muscle weakness.  Lab Results: PT of 16.9. BMP notable for 20. Studies/Results: Preliminary biopsy of R breast nodule show poorly differentiated spindle cells suggestive of a malignant cancer. 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 Negative for S-100. See official for detailed results.   MRI Head w/w/o Contrast -  No acute and intracranial process or metastasis. Moderate chronic small vessel ischemic disease. See note for formal report.  CXR - Pa, consolidation in medial right upper lope that disrupts the right heart border. This may be due to hilar lymphadenopathy. On lateral, mass in pressingtrachea and pressing against spine. See imagining note for formal report  CTA - Two 50 x 50 mm masses present in the RUL. Right breast mass also present. Adrenal mass also noted. Enlarged nodes and other nodules present. Filling defect within right middle lobe pulmonary artery concerning for pulmonary embolism. See imagining note for formal report.  CXR after CT biopsy of lung mass was unchanged from prior  Medications: I have reviewed the patient's current medications. Scheduled Meds: . amLODipine  2.5 mg Oral Daily  . docusate sodium  100 mg Oral BID  . fentaNYL      . insulin aspart  0-15 Units Subcutaneous TID WC  . levofloxacin  750 mg Oral q1800  . lidocaine      .  metoprolol succinate  25 mg Oral QPM  . midazolam      . [START ON 04/06/2016] rivaroxaban  15 mg Oral BID WC   Followed by  . [START ON 04/27/2016] rivaroxaban  20 mg Oral Q supper  . senna  1 tablet Oral BID  . simvastatin  20 mg Oral QPM  . sodium chloride flush  3 mL Intravenous Q12H   Continuous Infusions: PRN Meds:.acetaminophen **OR** acetaminophen, ALPRAZolam, diclofenac sodium, ondansetron **OR** ondansetron (ZOFRAN) IV, oxyCODONE Assessment/Plan: Active Problems:   Pulmonary embolus (HCC)    Lung mass   Breast nodule   Adrenal mass, left (HCC)   HTN (hypertension), benign   Malignancy (HCC)  Darrell Christensen, Darrell Christensen is a 70 yo M with a smoking history of 50 pack years, a family history of lung cancer in his father and brother and a PMH HTN, T2DM, melanoma removal 3 years ago and h/o alcohol abuse who present to the ED with intermittent chest pain for years and thoracic back pain for 1 month who was found to have R lung masses.   Possible Malignancy of unknown origin Patient has had thoracic back pain for about 1 month with intermittent right lower chest pain for years. During evaluation in the Ed, CXR was notable for mass in the RUL. CTA was notable for two masses in the RUL measuring 54 x 56 and 51 x 49 mm adjacent to the bronchi and spine. Other nodes were also present: Hilar node, tracheal node, L upper pulmonary nodule, subpleural nodules and few pleural based calcifications in left hemithorax. Right breast nodule and left adrenal nodule. Patient does notexhibit signs of horner syndrome. No signs of SVC syndrome with no facial edema or significant JVP. No signs of Rita Ohara as well. No intracranial process from MRI. 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 Unlikely related to lung process. Also negative for S-100. Transthoracic biopsy to evaluate mass.    - Will monitor patient for symptoms after transthorasic biopsy from earlier this morning  - Close proximity to spinal cord is concerning for cord compression. Neuro exam benign. Radiation referral  - Oncology outpatient and PET - Acetaminophen 650 mg q6 PRN - oxycodone '5mg'$  q4 PRN  Obstructive Pneumonia- Patient's tachycardia and elevated WBC may be consistent with an obstructive pneumonia due to malignancy. However, patient remains afebrile. Ttreatedwith Azi+Ceftriax x 4days to cover for CAP. Will switch to oral for home  Abx  - Levofloxacin 750 mg PO x 6 days (day 2)  Pulmonary embolism likely associated with malignancyPatient wastachycardic and satting in mid 90's without oxygen requirement. No complaints of SOB or hemoptysis. CTA consistent with pulmonary embolus within right middle lobe pulmonary artery. Initially received Lovenox for one day. Withheld 2/2 to biopsy. Continued with unfractionated heparin until determined no need for quick reversal. Switched to Xarelto 15 mg BID x 21 days, then '20mg'$  qd.   - Continue Xarelto tomorrow after no signs of bleed from biopsy  HTN Managed with amlodipine 2.'5mg'$  qd, metoprolol succinate '50mg'$  qd, and telmisartan-HCTZ 80-'25mg'$  qd at home  - Amlodipine 2.5 mg qd - metoprolol '50mg'$  qd  Anxiety  - Alprazolam 1 mg qhs oral  T2DM: Patient with T2DM on glipizide '5mg'$  daily and metformin '500mg'$  BID. - Hold oral meds - SSI-S  HLD: Patient with hyperlipidemia on simvastain '20mg'$  daily.  - continue home regimem  FENGI Patient reports no trouble with constipation  or abdominal pain/nausea.  - HH diet - colace 100 mg BID PO - senna 8.6 mg BID PO - ondansetron '4mg'$  q6 PRN PO DVT: SCD  CODE - Full  Dispo - Tor[place]orrow - f/u  PET scan and oncology with path report   This is a Careers information officer Note.  The care of the patient was discussed with Dr. Philipp Ovens and the assessment and plan formulated with their assistance.  Please see their attached note for official documentation of the daily encounter.   LOS: 5 days   Benn Moulder, Medical Student 04/05/2016, 2:36 PM

## 2016-04-06 LAB — CBC
HCT: 40.1 % (ref 39.0–52.0)
Hemoglobin: 13.1 g/dL (ref 13.0–17.0)
MCH: 28.4 pg (ref 26.0–34.0)
MCHC: 32.7 g/dL (ref 30.0–36.0)
MCV: 87 fL (ref 78.0–100.0)
Platelets: 317 10*3/uL (ref 150–400)
RBC: 4.61 MIL/uL (ref 4.22–5.81)
RDW: 13.8 % (ref 11.5–15.5)
WBC: 14.1 10*3/uL — ABNORMAL HIGH (ref 4.0–10.5)

## 2016-04-06 LAB — GLUCOSE, CAPILLARY
Glucose-Capillary: 120 mg/dL — ABNORMAL HIGH (ref 65–99)
Glucose-Capillary: 110 mg/dL — ABNORMAL HIGH (ref 65–99)

## 2016-04-06 MED ORDER — ONDANSETRON HCL 4 MG PO TABS: 4.0000 mg | ORAL_TABLET | Freq: Four times a day (QID) | ORAL | 0 refills | Status: DC | PRN

## 2016-04-06 MED ORDER — RIVAROXABAN 15 MG PO TABS
15.0000 mg | ORAL_TABLET | Freq: Two times a day (BID) | ORAL | 0 refills | Status: DC
Start: 2016-04-06 — End: 2016-05-07

## 2016-04-06 MED ORDER — LEVOFLOXACIN 750 MG PO TABS: 750.0000 mg | ORAL_TABLET | Freq: Every day | ORAL | 0 refills | Status: DC

## 2016-04-06 MED ORDER — DOCUSATE SODIUM 100 MG PO CAPS: 100.0000 mg | ORAL_CAPSULE | Freq: Two times a day (BID) | ORAL | 0 refills | Status: DC

## 2016-04-06 MED ORDER — METOPROLOL SUCCINATE ER 25 MG PO TB24: 25.0000 mg | ORAL_TABLET | Freq: Every evening | ORAL | 0 refills | Status: DC

## 2016-04-06 MED ORDER — DICLOFENAC SODIUM 1 % TD GEL: 4.0000 g | Freq: Four times a day (QID) | TRANSDERMAL | 0 refills | Status: DC | PRN

## 2016-04-06 NOTE — Progress Notes (Signed)
   Subjective: Doing well this morning. No complaints. Denies any shortness of breath or pain this morning. Ready to leave.   Objective:  Vital signs in last 24 hours: Vitals:   04/05/16 1115 04/05/16 1329 04/05/16 2015 04/06/16 0516  BP: 110/77 132/87 135/78 105/63  Pulse: 81 95 89 92  Resp: '18 18 18 18  '$ Temp:   97.9 F (36.6 C) 98.3 F (36.8 C)  TempSrc:   Oral Oral  SpO2: 95% 94% 97% 95%  Weight:    226 lb 1.6 oz (102.6 kg)  Height:       Physical Exam Constitutional: NAD, appears comfortable Neck: Supple, trachea midline. + submandibular LAD Cardiovascular: RRR, no murmurs, rubs, or gallops.  Pulmonary/Chest: CTAB, no wheezes, rales, or rhonchi.  Abdominal: Soft, non tender, non distended. +BS.  Extremities: Warm and well perfused. Distal pulses intact. No edema.  Neurological: A&Ox3, CN II - XII grossly intact.  Skin: No rashes or erythema  Psychiatric: Normal mood and affect  Assessment/Plan:  Multiple Masses Suspicious for Metastatic Disease: 2 large right lung masses, a right breast mass, and a left adrenal mass concerning for malignancy. Patient has a history of 2 superficial melanomas excised 3 years ago and has not followed up with a dermatologist. He is also a former smoker. Core biopsy of the breast mass was obtained and pathology was consistent with a spindle cell morphology. Cells stains were positive for smooth muscle actin and desmin. Results have been discussed at length with Dr. Beryle Beams who is concerned that the lung masses may represent a separate process from the chest wall mass, and patient went for CT guided biopsy of his lung mass yesterday morning. Additionally, there is concern for cord compression given the posterior location of the tumor if it continues to grow. Oncology is planning to refer for outpatient radiation therapy. Further management is pending final tissue pathology.  -- Follow up lung mass biopsy results -- Oncology following, appreciate  recommendations  -- Plan for outpatient radiation and oncology follow up in 1-2 weeks  -- discharge today with outpatient oncology follow up  Pulmonary Embolism: Likely hypercoagulable in the setting of malignancy. Patient had incidental finding of PE on CTA with no associated symptoms of shortness of breath, pleuritic chest pain, or tachycardia. CTA was reviewed with radiologist and there is a small central filling defect within right middle lobe pulm artery. Patient was started on heparin and transitioned to xarelto x 2 doses which was held for his biopsy today.  -- Restart xarelto today; 15 mg BID x 21 days then 20 mg daily    Post-obstructive Pneumonia: Patient with findings on CTA consistent with post-obstructive pneumonia vs malignant infiltrate. Will continue treating with antibiotics for the time being.  -- Levaquin '750mg'$  day 7 of 10  HTN: On amlodipine 2.'5mg'$  daily, metoprolol succinate '50mg'$  daily, and telmisartan-HCTZ 80-'25mg'$  daily at home. Holding telmisartan-HCTZ. -- Amlodipine 2.'5mg'$  daily -- metoprolol succinate '50mg'$  daily  DM II: -- SSI    HLD: --simvastatin '20mg'$  daily  FEN: No fluids, replete lytes prn, carb modified diet VTE ppx: SCDs today, restart xarelto tomorrow  Code Status: FULL   Dispo: Discharge today  Maryellen Pile, MD 04/06/2016, 12:15 PM Pager: [phone]

## 2016-04-06 NOTE — Discharge Summary (Signed)
Name: Darrell Christensen MRN: [id number] DOB: Jan 16, 1946 70 y.o. PCP: Celene Squibb, MD  Date of Admission: 03/31/2016 11:12 AM Date of Discharge: 04/06/2016 Attending Physician: Darrell Christensen, M.D.  Discharge Diagnosis: 1. R lung malignancy of unknown origin  2. Pulmonary Embolism  3. Postobstructive Pneumonia  4. HTN  Discharge Medications:   Medication List    STOP taking these medications   telmisartan-hydrochlorothiazide 80-25 MG tablet Commonly known as:  MICARDIS HCT     TAKE these medications   ALPRAZolam 1 MG tablet Commonly known as:  XANAX Take 1 mg by mouth 3 (three) times daily as needed for anxiety.   amLODipine 2.5 MG tablet Commonly known as:  NORVASC Take 2.5 mg by mouth daily.   diclofenac sodium 1 % Gel Commonly known as:  VOLTAREN Apply 4 g topically 4 (four) times daily as needed (back pain).   docusate sodium 100 MG capsule Commonly known as:  COLACE Take 1 capsule (100 mg total) by mouth 2 (two) times daily.   GLIPIZIDE XL 5 MG 24 hr tablet Generic drug:  glipiZIDE Take 5 mg by mouth daily.   levofloxacin 750 MG tablet Commonly known as:  LEVAQUIN Take 1 tablet (750 mg total) by mouth daily at 6 PM.   metFORMIN 500 MG tablet Commonly known as:  GLUCOPHAGE Take 500 mg by mouth 2 (two) times daily.   metoprolol succinate 25 MG 24 hr tablet Commonly known as:  TOPROL-XL Take 1 tablet (25 mg total) by mouth every evening. What changed:  medication strength  how much to take   ondansetron 4 MG tablet Commonly known as:  ZOFRAN Take 1 tablet (4 mg total) by mouth every 6 (six) hours as needed for nausea.   Rivaroxaban 15 MG Tabs tablet Commonly known as:  XARELTO Take 1 tablet (15 mg total) by mouth 2 (two) times daily with a meal.   simvastatin 20 MG tablet Commonly known as:  ZOCOR Take 20 mg by mouth every evening.       Disposition and follow-up:   DarrellDarrell Christensen was discharged from [facility] in Stable  condition.  At the hospital follow up visit please address:  1.  R lung malignancy of unknown origin - Biopsy of breast nodule showed a poorly differnetiated spindle cell neoplasm which stained negative for cytokeratin and melanoma markers. Molecular analysis was sent out. Follow up with these results in 2 weeks. Transthorasic biospy was performed the day before discharge. Path has been sent out and will require follow up within a week. There were no complications from the procedure and the patient remained asymptomatic. Please check on Darrell Christensen's respiratory status.    - Pulmonary Embolism - The patient suffered from a right middle lope embolism. The patient remained asymptomatic with an elevated wbc. The patient was sent home on Xarelto 15 mg BID as a loading dose for 21 days, to be followed by 20 mg daily.    - Postobstructive Pneumonia - The patient was treated for a possible postobstructive pneumonia. He was asympotmatic with an elevated wbc. He was sent on home with a remaining 4 day course of Levaquin. Please assess for a completion of Abx, wbc, respiratory status and signs of infection.    - Essential HTN - The patient intially came into the ED hypotensive. He was resumed on his 2/3 medications (metorpolol and amlodipine). His telmisartan-HCTZ was wittheld due to well controlled Bp. Please reassess BP for possible continuation of third medication   -  T2DM - The patient was kept from his normal diabetic medications and placed on an insulin sliding scale. He remained euglycemic. Please assess glucose after resuming home medications.  2.  Labs / imaging needed at time of follow-up: CBC  3.  Pending labs/ test needing follow-up: Transthoraic CT biopsy path results in a week and molecular analysis of breast biopsy in 2 weeks  Follow-up Appointments: Follow-up Information    Wende Neighbors, MD. Schedule an appointment as soon as possible for a visit.   Specialty:  Internal Medicine Why:  Please call  your primary care doctor to schedule an appointment in the next 1-2 weeks for hospital follow up. Contact information: [address] [place] [address] [phone]        Darrell K., MD Follow up.   Specialty:  Oncology Why:  They will call you for an appointment next week, if you do not hear from them, please call for an appointment. Contact information: [place] [address] [phone]           Hospital Course by problem list: Active Problems:   Pulmonary embolus (HCC)   Lung mass   Breast nodule   Adrenal mass, left (HCC)   HTN (hypertension), benign   Malignancy ([place])   1. R lung malignancy of unknown origin - Christensen, Darrell is a 69 yo M with a smoking history of 50 pack years, a family history of lung cancer in his father and brother and a PMH HTN, T2DM, melanoma removal 3 years agoand h/o alcohol abuse who present to the ED with intermittent chest pain for years and thoracic back pain for 1 month.   CTA discovered a small, central, filling defect seen within a right middle lobe pulmonary artery along with two masses in the RUL measuring 54 x 56 and 51 x 49 mm. These masses compress the adjacent bronchi. There is a 1.8 cm solid nodule in the right breast. 2.6 cm right hilar lymph node. 1.9 cm right. Tracheal node. Multiple prominent subcentimeter right peritracheal nodes. Additional 7 mm left upper lobe pulmonary nodule, 5 mm right upper lobe subpleural nodule and scattered pleural-based calcifications in the left hemithorax. An indeterminate 2.9 x 1.7 cm left adrenal nodule noted. See report below for details.  Patient did not exhibit signs associated with a pancoast tumor or a paraneoplastic syndrome. During his hospitalization, the patient did not exhibit any neurologic signs. But because of the location of the two masses being near the spine, cord compression is a concern. The patient should receive outpatient radiation to prevent  further growth.   A brain MRI was performed to evaluate any brain metastases that showed no acute and intracranial process or metastasis but moderate chronic small vessel ischemic disease.   The patient's back pain was controlled with tylenol. A biopsy of the breast was performed to avoid the more invasive transthoracic biopsy or bronchoscope. The biopsy of breast nodule showed a poorly differnetiated spindle cell neoplasm which stained negative for cytokeratin and melanoma markers. Molecular analysis was sent out. Because the breast biopsy was unlikely related to the lung process, the decision to biopsy the lung was made. Respiratory decided that a transthoracic would be easier and IR agreed. The transthorasic biospy was performed without complications. A follow up CXR showed no signs of pneumothorax or hemothorax. We are awaiting the path results. Will follow up with Oncology and Radiation Oncology.   - Pulmonary Embolism - The patient suffered from a right middle  lope embolism. The patient remained asymptomatic with no signs of respiratory distress and had an elevated wbc. In the Ed, the patient was started on LMWH but was discontinued because of the need to biopsy the breast and lung. Following the biopsy the patient was continued with rivaroxaban and sent home on 15 mg BID as a loading dose for 21 days, to be followed by 20 mg daily.    - Postobstructive Pneumonia - Patient's tachycardia and elevated WBC was thought as consistent with an obstructive pneumonia due to malignancy. However, patient was afebrile. He was treated with Azi+Ceftriax x 4 days to cover for CAP. He was switched to levofloxacin prior to discharge and sent home to finish his 10 day course.    - Essential HTN - He initially had blood pressures that were low. His home BP meds were initially held. They were slowly continued as his blood pressures elevated. He was discharged on metoprolol 50 mg qd and amlodipine 2.5 mg qd.    Discharge Vitals:   BP 105/63 (BP Location: Left Arm)   Pulse 92   Temp 98.3 F (36.8 C) (Oral)   Resp 18   Ht '5\' 9"'$  (1.753 m)   Wt 102.6 kg (226 lb 1.6 oz)   SpO2 95%   BMI 33.39 kg/m   Pertinent Labs, Studies, and Procedures:  CBC showed a WBC of 16.4 that remained elevated. At discharge he was 14.1.   CXR 03/31/16 Cardiac shadow is mildly enlarged. There is increased density projecting in the superior segment of the right lower lobe consistent with pneumonia. Fullness in the hilar region is noted as well as in the right peritracheal region. No other focal abnormality is seen. No bony abnormality is noted. IMPRESSION: Changes in the superior segment of the right lower lobe likely related to acute pneumonia and reactive lymphadenopathy. Followup PA and lateral chest X-ray is recommended in 3-4 weeks following trial of antibiotic therapy to ensure resolution and exclude underlying malignancy.  CTA 03/31/16 TECHNIQUE: Multidetector CT imaging of the chest was performed using the standard protocol during bolus administration of intravenous contrast. Multiplanar CT image reconstructions and MIPs were obtained to evaluate the vascular anatomy. CONTRAST:  100 cc Isovue 370 COMPARISON:  Chest radiograph 03/31/2016; 05/08/2011. FINDINGS: Cardiovascular: Normal heart size. No pericardial effusion. Coronary arterial vascular calcifications. Aorta and main pulmonary artery are normal in caliber. The right upper lobe pulmonary arteries are attenuated coursing over the superior aspect of the large right upper lobe area of consolidation. Within a right middle lobe pulmonary artery (image 145; series 607) there is a thin central filling defect. Mediastinum/Nodes: No axillary lymphadenopathy. There is a 2.6 cm right hilar lymph node (image 51; series 601). There is a 1.9 cm right peritracheal lymph node (image 40; series 601). Multiple prominent sub cm right peritracheal lymph  nodes are demonstrated. Normal appearance of the esophagus. Lungs/Pleura: Expiratory phase imaging. 7 mm left upper lobe pulmonary nodule (image 54; series 606). Dependent atelectasis within the lower lobes bilaterally. 5 mm subpleural right upper lobe nodule (image 55; series 606). Two adjacent large masslike areas of consolidation within the right upper lobe measuring 5.4 x 5.6 cm (image 101; series 607) and 5.1 x 4.9 cm (image 136; series 607). Few pleural based calcifications within the left hemi thorax. Upper Abdomen: Liver is diffusely low in attenuation. Indeterminate 2.9 x 1.7 cm left adrenal nodule. Normal morphology of the stomach. Musculoskeletal: Healed posterior right twelfth rib fracture. Thoracic spine degenerative changes. No aggressive or  acute appearing osseous lesions. Marked degenerative changes involving the right shoulder joint. There is a 1.8 cm solid nodule within the right breast (image 62; series 601). Indeterminate 1.1 cm low-attenuation nodule within the right thyroid lobe. Review of the MIP images confirms the above findings. IMPRESSION: Small central filling defect within a right middle lobe pulmonary artery concerning for pulmonary embolism. Two adjacent large masslike areas of consolidation within the right upper lobe with differential considerations including malignancy or pneumonia. Multiple enlarged right hilar and mediastinal lymph nodes which may be reactive or metastatic in etiology. Additional indeterminate pulmonary nodules as above. Recommend attention on follow-up. Indeterminate 2.9 cm left adrenal nodule. This needs dedicated evaluation with pre and post contrast-enhanced CT or MRI after confirmation of pulmonary process. Indeterminate nodule within the right breast. Recommend dedicated evaluation with mammography. Hepatic steatosis.  MR Brain w/wo C 03/24/16 TECHNIQUE: Multiplanar, multiecho pulse sequences of the brain and  surrounding structures were obtained without and with intravenous contrast. CONTRAST:  61m MULTIHANCE GADOBENATE DIMEGLUMINE 529 MG/ML IV SOLN COMPARISON:  MRI head Oct 03, 2003 FINDINGS: INTRACRANIAL CONTENTS: No reduced diffusion to suggest acute ischemia, or hypercellular tumor. No susceptibility artifact to suggest hemorrhage. Symmetric basal ganglia mineralization. The ventricles and sulci are normal for patient's age. No suspicious parenchymal signal, masses, mass effect. Patchy supratentorial white matter in pontine FLAIR T2 hyperintensities. No abnormal intraparenchymal or extra-axial enhancement. No abnormal extra-axial fluid collections. No extra-axial masses. Small posterior fossa arachnoid cyst without mass effect. VASCULAR: Normal major intracranial vascular flow voids present at skull base. SKULL AND UPPER CERVICAL SPINE: No abnormal sellar expansion. Bright T1 bone marrow signal most compatible with osteopenia. No suspicious calvarial bone marrow signal. Craniocervical junction maintained. SINUSES/ORBITS: The mastoid air-cells and included paranasal sinuses are well-aerated.The included ocular globes and orbital contents are non-suspicious. OTHER: None. IMPRESSION: No acute and intracranial process or metastasis. Moderate chronic small vessel ischemic disease.  CXR 04/05/16 No pneumothorax. The mass arising from the posterior aspect of the right upper lobe appears grossly stable. Lungs elsewhere clear. Heart size and pulmonary vascularity are normal. There is atherosclerotic calcification in the aorta. Adenopathy is better seen on CT than on current examination. There is advanced arthropathy in the left shoulder region. IMPRESSION: Large right upper lobe mass. No pneumothorax post biopsy. No new opacity. Stable cardiac silhouette. There is aortic atherosclerosis.   Discharge Instructions: Discharge Instructions    Diet - low sodium heart healthy    Complete by:   As directed    Increase activity slowly    Complete by:  As directed       Signed: EBenn Moulder Medical Student 04/06/2016, 1:11 PM   Pager: [phone]

## 2016-04-08 ENCOUNTER — Encounter: Payer: Self-pay | Admitting: *Deleted

## 2016-04-08 ENCOUNTER — Telehealth: Payer: Self-pay | Admitting: *Deleted

## 2016-04-08 NOTE — Progress Notes (Signed)
Patient called me back and I gave him the appt for [place] on 04/11/16. He verbalized understanding of appt time and place.

## 2016-04-08 NOTE — Telephone Encounter (Signed)
Oncology Nurse Navigator Documentation  Oncology Nurse Navigator Flowsheets 04/08/2016  Navigator Location [facility]  Navigator Encounter Type Telephone/I called patient to schedule for [place] this week.  I was unable to reach and left vm message to call me  Telephone Outgoing Call  Treatment Phase Pre-Tx/Tx Discussion  Barriers/Navigation Needs Coordination of Care  Interventions Coordination of Care  Coordination of Care Appts  Acuity Level 1  Time Spent with Patient 15

## 2016-04-09 ENCOUNTER — Telehealth: Payer: Self-pay

## 2016-04-09 ENCOUNTER — Telehealth: Payer: Self-pay | Admitting: *Deleted

## 2016-04-09 NOTE — Telephone Encounter (Signed)
Pt called to clarify his 12/7 appts. He knows about the [place] at [facility]. He was confused about the 545 appt. This message forwarded to [facility] for clarification b/c this nurse unsure if it is part of [place] or not.

## 2016-04-09 NOTE — Telephone Encounter (Signed)
Oncology Nurse Navigator Documentation  Oncology Nurse Navigator Flowsheets 04/09/2016  Navigator Location [facility]  Navigator Encounter Type Telephone  Telephone Outgoing Call/Darrell Christensen called.  He was confused about his appt.  I called and clarified.  He was thankful for the call back.   Treatment Phase Pre-Tx/Tx Discussion  Barriers/Navigation Needs Coordination of Care  Interventions Coordination of Care  Coordination of Care Appts  Acuity Level 1  Acuity Level 1 Minimal follow up required  Time Spent with Patient 15

## 2016-04-11 ENCOUNTER — Encounter: Payer: Self-pay | Admitting: Internal Medicine

## 2016-04-11 ENCOUNTER — Ambulatory Visit
Admission: RE | Admit: 2016-04-11 | Discharge: 2016-04-11 | Disposition: A | Discharge status: Home / Self Care | Payer: Medicare Other | Source: Ambulatory Visit | Attending: Radiation Oncology | Admitting: Radiation Oncology

## 2016-04-11 ENCOUNTER — Ambulatory Visit (HOSPITAL_BASED_OUTPATIENT_CLINIC_OR_DEPARTMENT_OTHER): Payer: Medicare Other | Admitting: Internal Medicine

## 2016-04-11 ENCOUNTER — Encounter: Payer: Self-pay | Admitting: *Deleted

## 2016-04-11 ENCOUNTER — Ambulatory Visit: Payer: Medicare Other | Attending: Internal Medicine | Admitting: Physical Therapy

## 2016-04-11 ENCOUNTER — Other Ambulatory Visit: Payer: Self-pay | Admitting: Medical Oncology

## 2016-04-11 ENCOUNTER — Other Ambulatory Visit (HOSPITAL_BASED_OUTPATIENT_CLINIC_OR_DEPARTMENT_OTHER): Payer: Medicare Other

## 2016-04-11 VITALS — BP 120/82 | HR 95 | Temp 98.5°F | Resp 18 | Ht 69.0 in | Wt 232.0 lb

## 2016-04-11 DIAGNOSIS — R2681 Unsteadiness on feet: Secondary | ICD-10-CM | POA: Insufficient documentation

## 2016-04-11 DIAGNOSIS — C3411 Malignant neoplasm of upper lobe, right bronchus or lung: Secondary | ICD-10-CM

## 2016-04-11 DIAGNOSIS — C496 Malignant neoplasm of connective and soft tissue of trunk, unspecified: Secondary | ICD-10-CM

## 2016-04-11 DIAGNOSIS — I2699 Other pulmonary embolism without acute cor pulmonale: Secondary | ICD-10-CM

## 2016-04-11 DIAGNOSIS — R293 Abnormal posture: Secondary | ICD-10-CM

## 2016-04-11 DIAGNOSIS — M6281 Muscle weakness (generalized): Secondary | ICD-10-CM | POA: Diagnosis not present

## 2016-04-11 DIAGNOSIS — I10 Essential (primary) hypertension: Secondary | ICD-10-CM | POA: Diagnosis not present

## 2016-04-11 DIAGNOSIS — F17210 Nicotine dependence, cigarettes, uncomplicated: Secondary | ICD-10-CM | POA: Diagnosis not present

## 2016-04-11 DIAGNOSIS — E278 Other specified disorders of adrenal gland: Secondary | ICD-10-CM

## 2016-04-11 DIAGNOSIS — C499 Malignant neoplasm of connective and soft tissue, unspecified: Secondary | ICD-10-CM

## 2016-04-11 DIAGNOSIS — E119 Type 2 diabetes mellitus without complications: Secondary | ICD-10-CM

## 2016-04-11 LAB — CBC WITH DIFFERENTIAL/PLATELET
BASO%: 0.7 % (ref 0.0–2.0)
Basophils Absolute: 0.1 10*3/uL (ref 0.0–0.1)
EOS%: 7.1 % — ABNORMAL HIGH (ref 0.0–7.0)
Eosinophils Absolute: 1.2 10*3/uL — ABNORMAL HIGH (ref 0.0–0.5)
HCT: 40.8 % (ref 38.4–49.9)
HGB: 13.4 g/dL (ref 13.0–17.1)
LYMPH%: 17.2 % (ref 14.0–49.0)
MCH: 28.3 pg (ref 27.2–33.4)
MCHC: 32.9 g/dL (ref 32.0–36.0)
MCV: 86.2 fL (ref 79.3–98.0)
MONO#: 1.5 10*3/uL — ABNORMAL HIGH (ref 0.1–0.9)
MONO%: 8.7 % (ref 0.0–14.0)
NEUT#: 11.2 10*3/uL — ABNORMAL HIGH (ref 1.5–6.5)
NEUT%: 66.3 % (ref 39.0–75.0)
Platelets: 342 10*3/uL (ref 140–400)
RBC: 4.73 10*6/uL (ref 4.20–5.82)
RDW: 13.8 % (ref 11.0–14.6)
WBC: 16.8 10*3/uL — ABNORMAL HIGH (ref 4.0–10.3)
lymph#: 2.9 10*3/uL (ref 0.9–3.3)

## 2016-04-11 LAB — COMPREHENSIVE METABOLIC PANEL
ALT: 13 U/L (ref 0–55)
AST: 11 U/L (ref 5–34)
Albumin: 3.3 g/dL — ABNORMAL LOW (ref 3.5–5.0)
Alkaline Phosphatase: 75 U/L (ref 40–150)
Anion Gap: 11 mEq/L (ref 3–11)
BUN: 15.6 mg/dL (ref 7.0–26.0)
CO2: 22 mEq/L (ref 22–29)
Calcium: 9.3 mg/dL (ref 8.4–10.4)
Chloride: 106 mEq/L (ref 98–109)
Creatinine: 1.1 mg/dL (ref 0.7–1.3)
EGFR: 69 mL/min/{1.73_m2} — ABNORMAL LOW (ref >90)
Glucose: 86 mg/dl (ref 70–140)
Potassium: 4.1 mEq/L (ref 3.5–5.1)
Sodium: 139 mEq/L (ref 136–145)
Total Bilirubin: 0.35 mg/dL (ref 0.20–1.20)
Total Protein: 7.2 g/dL (ref 6.4–8.3)

## 2016-04-11 NOTE — Progress Notes (Signed)
[place] [phone]   [phone]) [phone] Multidisciplinary thoracic oncology clinic  CONSULT NOTE  REFERRING PHYSICIAN: Dr. Murriel Hopper  REASON FOR CONSULTATION:  70 years old white male recently diagnosed with lung cancer.  HPI Darrell Christensen is a 70 y.o. male with past medical history significant for hypertension, diabetes mellitus, postobstructive pneumonia, pulmonary embolism, history of melanoma status post resection 3 years ago from the back. The patient also has a long history of heavy smoking but quit 2 years ago. He presented to [facility] on 03/31/2016 complaining of intermittent chest pain that has been going on for several months with radiation to the back. His pain was getting worse and it moved from the chest to in between the shoulder blades. In the emergency department he had chest x-ray on 03/31/2016 and it showed changes in the superior segment of the right lower lobe likely related to acute pneumonia and reactive lymphadenopathy. CT angiogram of the chest was performed on the same day and it showed small central filling defect within right middle lobe pulmonary artery concerning for pulmonary embolism. There was 2 adjacent large masslike areas of consolidation within the right upper lobe, the first one measured 5.4 x 5.6 and the second one measured 5.1 x 4.9 cm. These were suspicious for malignancy or pneumonia. There was multiple enlarged right hilar and mediastinal lymph nodes. There was also indeterminate 2.9 cm left adrenal nodule. The scan also showed indeterminate nodule within the right breast. On 04/02/2016 the patient underwent ultrasound guided core biopsy of the right chest wall breast mass. The final cytology ([id number]) showed small small cell neoplasm including low-grade leiomyosarcoma. On 04/05/2016 the patient underwent CT-guided core biopsy of the right upper lobe lung mass by interventional radiology. The final  cytology ([id number]) was consistent with poorly differentiated lung adenocarcinoma. The immunohistochemical stain showed strongly positive for TTF-1 and negative cytokeratin 5/6 consistent with lung adenocarcinoma. MRI of the brain on 04/03/2016 showed no acute intracranial process or metastases. The patient was started on anticoagulation and he is currently on Xarelto. Dr. Beryle Beams kindly referred the patient to the multidisciplinary thoracic oncology clinic today for evaluation and recommendation regarding treatment of his condition. When seen today the patient denied having any significant chest pain but continues to have shortness breath with exertion and no cough or hemoptysis. He has around 13 pounds of weight loss over the last 4 weeks. He denied having any current nausea, vomiting, diarrhea or constipation. He has no headache or visual changes. Family history significant for mother died from cerebral hemorrhage, father and brother had lung cancer. The patient is single and has 1 daughter. He was accompanied today by his daughter Darrell Christensen. He is to work as an Clinical biochemist. He has a history of smoking 3 packs per day for around 60 years. He quit 2 years ago. He also has a history of alcohol abuse but quit 7 years ago. He has no history of drug abuse.  HPI  Past Medical History:  Diagnosis Date  . Adrenal mass, left ([place]) 04/01/2016  . Diabetes mellitus without complication ([place])   . Hypertension   . Mass of breast, right 04/01/2016  . Mass of upper lobe of right lung 04/01/2016   2 adjacent, 5 cm masses, hilar adenopathy    History reviewed. No pertinent surgical history.  History reviewed. No pertinent family history.  Social History Social History  Substance Use Topics  . Smoking status: Former Smoker  Years: 55.00    Types: Cigarettes, E-cigarettes    Quit date: 04/11/2014  . Smokeless tobacco: Never Used  . Alcohol use Not on file    No Known  Allergies  Current Outpatient Prescriptions  Medication Sig Dispense Refill  . ALPRAZolam (XANAX) 1 MG tablet Take 1 mg by mouth 3 (three) times daily as needed for anxiety.    Marland Kitchen amLODipine (NORVASC) 2.5 MG tablet Take 2.5 mg by mouth daily.  4  . diclofenac sodium (VOLTAREN) 1 % GEL Apply 4 g topically 4 (four) times daily as needed (back pain). 1 Tube 0  . docusate sodium (COLACE) 100 MG capsule Take 1 capsule (100 mg total) by mouth 2 (two) times daily. 10 capsule 0  . GLIPIZIDE XL 5 MG 24 hr tablet Take 5 mg by mouth daily.  4  . metFORMIN (GLUCOPHAGE) 500 MG tablet Take 500 mg by mouth 2 (two) times daily.  4  . metoprolol succinate (TOPROL-XL) 25 MG 24 hr tablet Take 1 tablet (25 mg total) by mouth every evening. 30 tablet 0  . ondansetron (ZOFRAN) 4 MG tablet Take 1 tablet (4 mg total) by mouth every 6 (six) hours as needed for nausea. 20 tablet 0  . Rivaroxaban (XARELTO) 15 MG TABS tablet Take 1 tablet (15 mg total) by mouth 2 (two) times daily with a meal. 41 tablet 0  . simvastatin (ZOCOR) 20 MG tablet Take 20 mg by mouth every evening.  5   No current facility-administered medications for this visit.     Review of Systems  Constitutional: positive for anorexia, fatigue and weight loss Eyes: negative Ears, nose, mouth, throat, and face: negative Respiratory: positive for dyspnea on exertion, negative for cough and pleurisy/chest pain Cardiovascular: negative for orthopnea and palpitations Gastrointestinal: negative for constipation, diarrhea, nausea and vomiting Genitourinary:negative for dysuria, frequency and hematuria Integument/breast: negative Hematologic/lymphatic: negative for bleeding and easy bruising Musculoskeletal:positive for muscle weakness Neurological: negative Behavioral/Psych: negative Endocrine: negative Allergic/Immunologic: negative  Physical Exam  OFB:PZWCH, healthy, no distress, well nourished, well developed and anxious SKIN: skin color,  texture, turgor are normal, no rashes or significant lesions HEAD: Normocephalic, No masses, lesions, tenderness or abnormalities EYES: normal, PERRLA, Conjunctiva are pink and non-injected EARS: External ears normal, Canals clear OROPHARYNX:no exudate, no erythema and lips, buccal mucosa, and tongue normal  NECK: supple, no adenopathy, no JVD LYMPH:  no palpable lymphadenopathy, no hepatosplenomegaly BREAST:not examined LUNGS: scattered rhonchi bilaterally, scattered rales bilaterally HEART: regular rate & rhythm and no murmurs ABDOMEN:abdomen soft, non-tender, obese, normal bowel sounds and no masses or organomegaly BACK: Back symmetric, no curvature., No CVA tenderness EXTREMITIES:no joint deformities, effusion, or inflammation, no edema, no skin discoloration  NEURO: alert & oriented x 3 with fluent speech, no focal motor/sensory deficits  PERFORMANCE STATUS: ECOG 1  LABORATORY DATA: Lab Results  Component Value Date   WBC 16.8 (H) 04/11/2016   HGB 13.4 04/11/2016   HCT 40.8 04/11/2016   MCV 86.2 04/11/2016   PLT 342 04/11/2016      Chemistry      Component Value Date/Time   NA 139 04/11/2016 1431   K 4.1 04/11/2016 1431   CL 103 04/05/2016 0848   CO2 22 04/11/2016 1431   BUN 15.6 04/11/2016 1431   CREATININE 1.1 04/11/2016 1431      Component Value Date/Time   CALCIUM 9.3 04/11/2016 1431   ALKPHOS 75 04/11/2016 1431   AST 11 04/11/2016 1431   ALT 13 04/11/2016 1431   BILITOT  0.35 04/11/2016 1431       RADIOGRAPHIC STUDIES: Dg Chest 1 View  Result Date: 04/05/2016 CLINICAL DATA:  Status post lung biopsy EXAM: CHEST 1 VIEW COMPARISON:  March 31, 2016 FINDINGS: No pneumothorax. The mass arising from the posterior aspect of the right upper lobe appears grossly stable. Lungs elsewhere clear. Heart size and pulmonary vascularity are normal. There is atherosclerotic calcification in the aorta. Adenopathy is better seen on CT than on current examination. There is  advanced arthropathy in the left shoulder region. IMPRESSION: Large right upper lobe mass. No pneumothorax post biopsy. No new opacity. Stable cardiac silhouette. There is aortic atherosclerosis. Electronically Signed   By: Lowella Grip III M.D.   On: 04/05/2016 12:47   Dg Chest 2 View  Result Date: 03/31/2016 CLINICAL DATA:  Right-sided chest pain EXAM: CHEST  2 VIEW COMPARISON:  05/08/2011 FINDINGS: Cardiac shadow is mildly enlarged. There is increased density projecting in the superior segment of the right lower lobe consistent with pneumonia. Fullness in the hilar region is noted as well as in the right peritracheal region. No other focal abnormality is seen. No bony abnormality is noted. IMPRESSION: Changes in the superior segment of the right lower lobe likely related to acute pneumonia and reactive lymphadenopathy. Followup PA and lateral chest X-ray is recommended in 3-4 weeks following trial of antibiotic therapy to ensure resolution and exclude underlying malignancy. Electronically Signed   By: Inez Catalina M.D.   On: 03/31/2016 11:39   Ct Angio Chest Pe W/cm &/or Wo Cm  Result Date: 03/31/2016 CLINICAL DATA:  Patient with intermittent chest pain. Consolidation within the right lung on recent chest radiograph. EXAM: CT ANGIOGRAPHY CHEST WITH CONTRAST TECHNIQUE: Multidetector CT imaging of the chest was performed using the standard protocol during bolus administration of intravenous contrast. Multiplanar CT image reconstructions and MIPs were obtained to evaluate the vascular anatomy. CONTRAST:  100 cc Isovue 370 COMPARISON:  Chest radiograph 03/31/2016; 05/08/2011. FINDINGS: Cardiovascular: Normal heart size. No pericardial effusion. Coronary arterial vascular calcifications. Aorta and main pulmonary artery are normal in caliber. The right upper lobe pulmonary arteries are attenuated coursing over the superior aspect of the large right upper lobe area of consolidation. Within a right middle  lobe pulmonary artery (image 145; series 607) there is a thin central filling defect. Mediastinum/Nodes: No axillary lymphadenopathy. There is a 2.6 cm right hilar lymph node (image 51; series 601). There is a 1.9 cm right peritracheal lymph node (image 40; series 601). Multiple prominent sub cm right peritracheal lymph nodes are demonstrated. Normal appearance of the esophagus. Lungs/Pleura: Expiratory phase imaging. 7 mm left upper lobe pulmonary nodule (image 54; series 606). Dependent atelectasis within the lower lobes bilaterally. 5 mm subpleural right upper lobe nodule (image 55; series 606). Two adjacent large masslike areas of consolidation within the right upper lobe measuring 5.4 x 5.6 cm (image 101; series 607) and 5.1 x 4.9 cm (image 136; series 607). Few pleural based calcifications within the left hemi thorax. Upper Abdomen: Liver is diffusely low in attenuation. Indeterminate 2.9 x 1.7 cm left adrenal nodule. Normal morphology of the stomach. Musculoskeletal: Healed posterior right twelfth rib fracture. Thoracic spine degenerative changes. No aggressive or acute appearing osseous lesions. Marked degenerative changes involving the right shoulder joint. There is a 1.8 cm solid nodule within the right breast (image 62; series 601). Indeterminate 1.1 cm low-attenuation nodule within the right thyroid lobe. Review of the MIP images confirms the above findings. IMPRESSION: Small central filling defect  within a right middle lobe pulmonary artery concerning for pulmonary embolism. Two adjacent large masslike areas of consolidation within the right upper lobe with differential considerations including malignancy or pneumonia. Multiple enlarged right hilar and mediastinal lymph nodes which may be reactive or metastatic in etiology. Additional indeterminate pulmonary nodules as above. Recommend attention on follow-up. Indeterminate 2.9 cm left adrenal nodule. This needs dedicated evaluation with pre and post  contrast-enhanced CT or MRI after confirmation of pulmonary process. Indeterminate nodule within the right breast. Recommend dedicated evaluation with mammography. Hepatic steatosis. Critical Value/emergent results were called by telephone at the time of interpretation on 03/31/2016 at 2:49 pm to Dr. Lita Mains , who verbally acknowledged these results. Electronically Signed   By: Lovey Newcomer M.D.   On: 03/31/2016 14:54   Mr Jeri Cos GL Contrast  Result Date: 04/03/2016 CLINICAL DATA:  Suspected primary lung cancer with breast mass and adrenal mass. Evaluate for intracranial metastasis. History of hypertension, diabetes. EXAM: MRI HEAD WITHOUT AND WITH CONTRAST TECHNIQUE: Multiplanar, multiecho pulse sequences of the brain and surrounding structures were obtained without and with intravenous contrast. CONTRAST:  46m MULTIHANCE GADOBENATE DIMEGLUMINE 529 MG/ML IV SOLN COMPARISON:  MRI head Oct 03, 2003 FINDINGS: INTRACRANIAL CONTENTS: No reduced diffusion to suggest acute ischemia, or hypercellular tumor. No susceptibility artifact to suggest hemorrhage. Symmetric basal ganglia mineralization. The ventricles and sulci are normal for patient's age. No suspicious parenchymal signal, masses, mass effect. Patchy supratentorial white matter in pontine FLAIR T2 hyperintensities. No abnormal intraparenchymal or extra-axial enhancement. No abnormal extra-axial fluid collections. No extra-axial masses. Small posterior fossa arachnoid cyst without mass effect. VASCULAR: Normal major intracranial vascular flow voids present at skull base. SKULL AND UPPER CERVICAL SPINE: No abnormal sellar expansion. Bright T1 bone marrow signal most compatible with osteopenia. No suspicious calvarial bone marrow signal. Craniocervical junction maintained. SINUSES/ORBITS: The mastoid air-cells and included paranasal sinuses are well-aerated.The included ocular globes and orbital contents are non-suspicious. OTHER: None. IMPRESSION: No acute  and intracranial process or metastasis. Moderate chronic small vessel ischemic disease. Electronically Signed   By: CElon AlasM.D.   On: 04/03/2016 21:51   [place]  Result Date: 04/02/2016 CLINICAL DATA:  Large right upper lobe lung mass with subcutaneous soft tissue nodule in the right chest wall and left adrenal mass. The patient presents for biopsy of the right chest wall mass. EXAM: ULTRASOUND GUIDED CORE BIOPSY OF RIGHT CHEST WALL MASS MEDICATIONS: 2.0 mg IV Versed; 100 mcg IV Fentanyl Total Moderate Sedation Time: 15 minutes The patient's level of consciousness and physiologic status were continuously monitored during the procedure by Radiology nursing. PROCEDURE: The procedure, risks, benefits, and alternatives were explained to the patient. Questions regarding the procedure were encouraged and answered. The patient understands and consents to the procedure. A time out was performed prior to initiating the procedure. The right chest wall was prepped with chlorhexidine in a sterile fashion, and a sterile drape was applied covering the operative field. A sterile gown and sterile gloves were used for the procedure. Local anesthesia was provided with 1% Lidocaine. Subcutaneous right chest wall nodule was localized by ultrasound. Core biopsy was performed with a 16 gauge device. A total of 4 separate core biopsy samples were obtained and submitted in formalin. COMPLICATIONS: None. FINDINGS: Oval-shaped hypoechoic solid soft tissue nodule was localized in the subcutaneous fat of the right chest wall measuring approximately 1.5 cm in greatest diameter. Solid tissue was obtained. IMPRESSION: Ultrasound-guided core biopsy performed of a solid 1.5 cm soft  tissue nodule in the right chest wall. Electronically Signed   By: Aletta Edouard M.D.   On: 04/02/2016 11:39   Ct Biopsy  Result Date: 04/05/2016 INDICATION: 70 year old male with a history of lung mass, concerning for primary lung carcinoma.  Pulmonary has reviewed the case, and feels there is low yield with bronchoscopy. EXAM: CT BIOPSY MEDICATIONS: None. ANESTHESIA/SEDATION: Moderate (conscious) sedation was employed during this procedure. A total of Versed 2.0 mg and Fentanyl 50 mcg was administered intravenously. Moderate Sedation Time: 10 minutes. The patient's level of consciousness and vital signs were monitored continuously by radiology nursing throughout the procedure under my direct supervision. FLUOROSCOPY TIME:  CT COMPLICATIONS: None PROCEDURE: The procedure, risks, benefits, and alternatives were explained to the patient and the patient's family. Specific risks that were addressed included bleeding, infection, pneumothorax, need for further procedure including chest tube placement, chance of delayed pneumothorax or hemorrhage, hemoptysis, nondiagnostic sample, cardiopulmonary collapse, death. Questions regarding the procedure were encouraged and answered. The patient understands and consents to the procedure. Patient was positioned in the prone position on the CT gantry table and a scout CT of the chest was performed for planning purposes. Once angle of approach was determined, the skin and subcutaneous tissues this scan was prepped and draped in the usual sterile fashion, and a sterile drape was applied covering the operative field. A sterile gown and sterile gloves were used for the procedure. Local anesthesia was provided with 1% Lidocaine. The skin and subcutaneous tissues were infiltrated 1% lidocaine for local anesthesia, and a small stab incision was made with an 11 blade scalpel. Using CT guidance, a 17 gauge trocar needle was advanced into the right upper lobetarget. After confirmation of the tip, separate 18 gauge core biopsies were performed. These were placed into solution for transportation to the lab. Biosentry Device was deployed. A final CT image was performed. Patient tolerated the procedure well and remained  hemodynamically stable throughout. No complications were encountered and no significant blood loss was encounter IMPRESSION: Status post CT-guided biopsy of right upper lobe lesion, with tissue specimen sent to pathology for complete histopathologic analysis. Signed, Dulcy Fanny. Earleen Newport, DO [facility] Radiology Electronically Signed   By: Corrie Mckusick D.O.   On: 04/05/2016 13:52    ASSESSMENT: This is a very pleasant 70 years old white male recently diagnosed with: 1) non-small cell lung cancer, adenocarcinoma likely stage IIIB/IV presented with 2 masses in the right upper lobe in addition to mediastinal lymphadenopathy and a questionable left adrenal metastasis. Pending further staging workup. 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 3) new pulmonary embolism 4) diabetes mellitus and hypertension.   PLAN: I had a lengthy discussion with the patient and his daughter today about his current disease is stage, prognosis and treatment options. The patient has a lot of comorbidities and at least 2 malignancies including non-small cell lung cancer, adenocarcinoma likely stage IV but pending the final restaging workup. I recommended for the patient to complete staging workup by ordering a PET scan to rule out any metastatic disease.. If the patient has locally advanced disease in the lung, we will consider him for treatment with a course of concurrent chemoradiation. If the patient has a stage IV lung cancer, he may be considered for treatment with systemic chemotherapy versus immunotherapy versus targeted therapy depending on the final molecular studies.. I would ask the pathology department to send his tissue block for molecular studies and PDL 1 testing. 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 For the recently diagnosed pulmonary embolism, I advised the patient to continue his current treatment with Xarelto. For hypertension and diabetes mellitus, I recommended for the patient to continue his routine follow-up visit and evaluation by his primary care physician. The patient was seen during the multidisciplinary thoracic oncology clinic today by medical oncology, radiation oncology, thoracic navigator, social worker and physical therapist. I would see him back for follow-up visit in 3 weeks for reevaluation and more detailed discussion of his treatment options based on the final staging workup and molecular studies. He was advised to call sooner if he has any concerning symptoms in the interval.  The patient voices understanding of current disease status and treatment options and is in agreement with the current care plan.  All questions were answered. The patient knows to call the clinic with any problems, questions or concerns. We can certainly see the patient much sooner if necessary.  Thank you so much for allowing me to participate in the care of [place]. I will continue to follow up the patient with you and assist in his care.  I spent 55 minutes counseling the patient face to face. The total time spent in the appointment was 80 minutes.  Disclaimer: This note was dictated with voice recognition software. Similar sounding words can inadvertently be transcribed and may not be corrected upon review.   Hajra Port K. April 11, 2016, 4:30 PM

## 2016-04-11 NOTE — Therapy (Signed)
[place], [place], [address] Phone: [phone]   Fax:  [phone]  Physical Therapy Evaluation  Patient Details  Name: Darrell Christensen MRN: [id number] Date of Birth: 1946/04/03 Referring Provider: Curt Bears  Encounter Date: 04/11/2016      PT End of Session - 04/11/16 1616    Visit Number 1   Number of Visits 1   PT Start Time 1510   PT Stop Time 1540   PT Time Calculation (min) 30 min   Activity Tolerance Patient tolerated treatment well   Behavior During Therapy [facility] for tasks assessed/performed      Past Medical History:  Diagnosis Date  . Adrenal mass, left ([place]) 04/01/2016  . Diabetes mellitus without complication ([place])   . Hypertension   . Mass of breast, right 04/01/2016  . Mass of upper lobe of right lung 04/01/2016   2 adjacent, 5 cm masses, hilar adenopathy    No past surgical history on file.  There were no vitals filed for this visit.       Subjective Assessment - 04/11/16 1605    Subjective Daughter reports that he gets some shortness of breath with walking.  h/o ETOH abuse by his admission.   Patient is accompained by: Family member  daughter   Pertinent History Pt. presented with chest pain.  Scans and CT biopsy showed right upper lobe mass of adenocarcinoma; there are two large right upper lobe masses.  Expected to have chemotherapy or biologic or immunotherapy.  Brain MRI was negative.  Ex-smoker (3 ppd).  DM, HTN, recent hospitalization.   Patient Stated Goals get info from all lung clinic providers   Currently in Pain? No/denies            [facility] PT Assessment - 04/11/16 0001      Assessment   Medical Diagnosis right upper lobe adenocarcinoma   Referring Provider Curt Bears   Onset Date/Surgical Date 03/31/16   Prior Therapy none     Precautions   Precautions Other (comment)   Precaution Comments cancer precautions     Restrictions   Weight  Bearing Restrictions No     Balance Screen   Has the patient fallen in the past 6 months No   Has the patient had a decrease in activity level because of a fear of falling?  No   Is the patient reluctant to leave their home because of a fear of falling?  No     Home Environment   Living Environment Private residence   Living Arrangements Alone   Type of [place] to enter   Entrance Stairs-Number of Steps 2     Prior Function   Level of Independence Independent   Leisure no regular exercise     Cognition   Overall Cognitive Status Within Functional Limits for tasks assessed     Observation/Other Assessments   Observations pleasant gentleman, obese with large abdomen     Functional Tests   Functional tests Sit to Stand     Sit to Stand   Comments 6 times in 30 seconds, below average for age  mild dyspnea following this     Posture/Postural Control   Posture/Postural Control Postural limitations   Postural Limitations Forward head;Increased thoracic kyphosis  slight kyphosis     ROM / Strength   AROM / PROM / Strength AROM     AROM   Overall AROM Comments trunk AROM in  standing limited about 10% throughout     Ambulation/Gait   Ambulation/Gait Yes   Ambulation/Gait Assistance 7: Independent     Balance   Balance Assessed Yes     Dynamic Standing Balance   Dynamic Standing - Comments reaches forward 10 inches in standing, below average for age                           PT Education - 04/11/16 1615    Education provided Yes   Education Details energy conservation, walking, posture, breathing, Cure article on staying active, PT info   Person(s) Educated Patient;Child(ren)   Methods Explanation;Handout   Comprehension Verbalized understanding               Lung Clinic Goals - 04/11/16 1620      Patient will be able to verbalize understanding of the benefit of exercise to decrease fatigue.   Status  Achieved     Patient will be able to verbalize the importance of posture.   Status Achieved     Patient will be able to demonstrate diaphragmatic breathing for improved lung function.   Status Achieved     Patient will be able to verbalize understanding of the role of physical therapy to prevent functional decline and who to contact if physical therapy is needed.   Status Achieved             Plan - 04/11/16 1616    Clinical Impression Statement Pleasant gentleman with obese abdomen, smoking and ETOH abuse history, now with right upper lobe adenocarcinoma.  He has abnormal posture, decreased trunk ROM, decreased balance, and below average sit to/from stand for 30 seconds.     Rehab Potential Good   PT Frequency One time visit   PT Treatment/Interventions Patient/family education   PT Next Visit Plan None at this time.   PT Home Exercise Plan walking, breathing exercises   Consulted and Agree with Plan of Care Patient      Patient will benefit from skilled therapeutic intervention in order to improve the following deficits and impairments:  Postural dysfunction, Decreased mobility, Impaired flexibility, Decreased balance  Visit Diagnosis: Abnormal posture - Plan: PT plan of care cert/re-cert  Muscle weakness (generalized) - Plan: PT plan of care cert/re-cert  Unsteadiness on feet - Plan: PT plan of care cert/re-cert      G-Codes - 32/44/01 1620    Functional Assessment Tool Used clinical judgement   Functional Limitation Changing and maintaining body position   Changing and Maintaining Body Position Current Status (U2725) At least 1 percent but less than 20 percent impaired, limited or restricted   Changing and Maintaining Body Position Goal Status (D6644) At least 1 percent but less than 20 percent impaired, limited or restricted   Changing and Maintaining Body Position Discharge Status (I3474) At least 1 percent but less than 20 percent impaired, limited or restricted        Problem List Patient Active Problem List   Diagnosis Date Noted  . Malignancy ([place])   . Primary cancer of right upper lobe of lung ([place]) 04/01/2016  . Breast nodule 04/01/2016  . Adrenal mass, left ([place]) 04/01/2016  . HTN (hypertension), benign 04/01/2016  . Pulmonary embolus ([place]) 03/31/2016    SALISBURY,DONNA 04/11/2016, 4:22 PM  [place], [place], [address] Phone: [phone]   Fax:  [phone]  Name: SAMYAK SACKMANN MRN: [id number] Date of Birth:  16-Jun-1945  Serafina Royals, PT 04/11/16 4:22 PM

## 2016-04-11 NOTE — Progress Notes (Signed)
Radiation Oncology         [phone] ________________________________  Initial outpatient Consultation  Name: Darrell Christensen MRN: [id number]  Date: 04/11/2016  DOB: [birth year]  QG:BEEF [name], MD  Annia Belt, MD   REFERRING PHYSICIAN: Annia Belt, MD  DIAGNOSIS: The encounter diagnosis was Primary cancer of right upper lobe of lung ([place]).    ICD-9-CM ICD-10-CM   1. Primary cancer of right upper lobe of lung (HCC) 162.3 C34.11     HISTORY OF PRESENT ILLNESS: Darrell Christensen is a 70 y.o. male seen in the multidisciplinary thoracic oncology clinic for a new diagnosis of right upper lobe lung cancer. The patient was admitted to the hospital on 03/31/2016 complaining of intermittent chest pain over the past few years and pain between the shoulder blades intermittently for about one month. Chest X-ray at that time showed changes in the superior segment of the right lower lobe. CTPA on 03/31/2016 showed two adjacent large masslike areas of consolidation within the right upper lobe with differential considerations including malignancy or pneumonia, with multiple enlarged right hilar and mediastinal lymph nodes which may be reactive or metastatic in etiology. Additional indeterminate pulmonary nodules seen. There was an indeterminate 2.9 cm left adrenal nodule as well as an indeterminate nodule within the right breast.   Biopsy of the right chest wall on 04/02/2016 showed smooth muscle neoplasm. Accordingly a Brain MRI was performed on 04/03/2016 which showed no acute and intracranial process or metastasis. Biopsy of the right upper lobe on 04/05/2016 revealed non-small cell carcinoma. The patient has been kindly referred for discussion of treatment options in the management of his disease.      PREVIOUS RADIATION THERAPY: No  PAST MEDICAL HISTORY:  Past Medical History:  Diagnosis Date  . Adrenal mass, left ([place]) 04/01/2016  . Diabetes mellitus without complication ([place])   .  Hypertension   . Mass of breast, right 04/01/2016  . Mass of upper lobe of right lung 04/01/2016   2 adjacent, 5 cm masses, hilar adenopathy      PAST SURGICAL HISTORY:No past surgical history on file.  FAMILY HISTORY: No family history on file.  SOCIAL HISTORY:  Social History   Social History  . Marital status: Divorced    Spouse name: N/A  . Number of children: N/A  . Years of education: N/A   Occupational History  . Not on file.   Social History Main Topics  . Smoking status: Former Smoker    Years: 55.00    Types: Cigarettes, E-cigarettes    Quit date: 04/11/2014  . Smokeless tobacco: Never Used  . Alcohol use Not on file  . Drug use: Unknown  . Sexual activity: Not on file   Other Topics Concern  . Not on file   Social History Narrative  . No narrative on file    ALLERGIES: Patient has no known allergies.  MEDICATIONS:  Current Outpatient Prescriptions  Medication Sig Dispense Refill  . ALPRAZolam (XANAX) 1 MG tablet Take 1 mg by mouth 3 (three) times daily as needed for anxiety.    Marland Kitchen amLODipine (NORVASC) 2.5 MG tablet Take 2.5 mg by mouth daily.  4  . diclofenac sodium (VOLTAREN) 1 % GEL Apply 4 g topically 4 (four) times daily as needed (back pain). 1 Tube 0  . docusate sodium (COLACE) 100 MG capsule Take 1 capsule (100 mg total) by mouth 2 (two) times daily. 10 capsule 0  . GLIPIZIDE XL 5 MG 24 hr tablet  Take 5 mg by mouth daily.  4  . metFORMIN (GLUCOPHAGE) 500 MG tablet Take 500 mg by mouth 2 (two) times daily.  4  . metoprolol succinate (TOPROL-XL) 25 MG 24 hr tablet Take 1 tablet (25 mg total) by mouth every evening. 30 tablet 0  . ondansetron (ZOFRAN) 4 MG tablet Take 1 tablet (4 mg total) by mouth every 6 (six) hours as needed for nausea. 20 tablet 0  . Rivaroxaban (XARELTO) 15 MG TABS tablet Take 1 tablet (15 mg total) by mouth 2 (two) times daily with a meal. 41 tablet 0  . simvastatin (ZOCOR) 20 MG tablet Take 20 mg by mouth every evening.  5    No current facility-administered medications for this encounter.     REVIEW OF SYSTEMS:  On review of systems, the patient reports that He is doing well overall. He lives between [place] and [place]. He denies any chest pain, shortness of breath, fevers, chills, night sweats. He reports cough with no further sputum production. He has lost about 13 lbs since the Sunday after Thanksgiving. He denies any bowel or bladder disturbances, and denies abdominal pain, nausea or vomiting. He denies any new musculoskeletal or joint aches or pains. He smokes a vaping cigarette now, prior to that he smoked 2 to 3 ppd. A complete review of systems is obtained and is otherwise negative.    PHYSICAL EXAM:  Wt Readings from Last 3 Encounters:  04/11/16 232 lb (105.2 kg)  04/06/16 226 lb 1.6 oz (102.6 kg)   Temp Readings from Last 3 Encounters:  04/11/16 98.5 F (36.9 C) (Oral)  04/06/16 98.3 F (36.8 C) (Oral)   BP Readings from Last 3 Encounters:  04/11/16 120/82  04/06/16 105/63   Pulse Readings from Last 3 Encounters:  04/11/16 95  04/06/16 92    Pain Scale 0/10 In general this is a well appearing Caucasian male in no acute distress. He is alert and oriented x4 and appropriate throughout the examination. HEENT reveals that the patient is normocephalic, atraumatic. EOMs are intact. PERRLA. Skin is intact without any evidence of gross lesions. Cardiovascular exam reveals a regular rate and rhythm, no clicks rubs or murmurs are auscultated. Chest reveals coarse breath sounds bilaterally. Lymphatic assessment is performed and does not reveal any adenopathy in the cervical, supraclavicular, axillary, or inguinal chains. Abdomen has active bowel sounds in all quadrants and is intact. The abdomen is soft, non tender, non distended. Lower extremities are negative for pretibial pitting edema, deep calf tenderness, cyanosis or clubbing.   KPS = 90  100 - Normal; no complaints; no evidence of  disease. 90   - Able to carry on normal activity; minor signs or symptoms of disease. 80   - Normal activity with effort; some signs or symptoms of disease. 79   - Cares for self; unable to carry on normal activity or to do active work. 60   - Requires occasional assistance, but is able to care for most of his personal needs. 50   - Requires considerable assistance and frequent medical care. 78   - Disabled; requires special care and assistance. 25   - Severely disabled; hospital admission is indicated although death not imminent. 14   - Very sick; hospital admission necessary; active supportive treatment necessary. 10   - Moribund; fatal processes progressing rapidly. 0     - Dead  Karnofsky DA, Abelmann WH, Craver LS and Burchenal [facility] [phone]) The use of the nitrogen mustards in the palliative  treatment of carcinoma: with particular reference to bronchogenic carcinoma Cancer 1 [id number]  LABORATORY DATA:  Lab Results  Component Value Date   WBC 16.8 (H) 04/11/2016   HGB 13.4 04/11/2016   HCT 40.8 04/11/2016   MCV 86.2 04/11/2016   PLT 342 04/11/2016   Lab Results  Component Value Date   NA 139 04/11/2016   K 4.1 04/11/2016   CL 103 04/05/2016   CO2 22 04/11/2016   Lab Results  Component Value Date   ALT 13 04/11/2016   AST 11 04/11/2016   ALKPHOS 75 04/11/2016   BILITOT 0.35 04/11/2016   Surgical Pathology Diagnosis 04/02/2016 Soft Tissue Needle Core Biopsy, Right Chest Wall SMOOTH MUSCLE NEOPLASM  Surgical Pathology Diagnosis 04/05/2016 Lung, needle/core biopsy(ies), Right Upper Lobe - NON SMALL CELL CARCINOMA. ADDENDUM: Immunohistochemistry shows the tumor is strongly positive with thyroid transcription factor-1 and is mostly negative with cytokeratin 5/6. The immunophenotype is consistent with poorly differentiated lung adenocarcinoma. (JDP:gt, 04/09/16)   RADIOGRAPHY: Dg Chest 1 View  Result Date: 04/05/2016 CLINICAL DATA:  Status post lung biopsy EXAM: CHEST 1 VIEW  COMPARISON:  March 31, 2016 FINDINGS: No pneumothorax. The mass arising from the posterior aspect of the right upper lobe appears grossly stable. Lungs elsewhere clear. Heart size and pulmonary vascularity are normal. There is atherosclerotic calcification in the aorta. Adenopathy is better seen on CT than on current examination. There is advanced arthropathy in the left shoulder region. IMPRESSION: Large right upper lobe mass. No pneumothorax post biopsy. No new opacity. Stable cardiac silhouette. There is aortic atherosclerosis. Electronically Signed   By: Lowella Grip III M.D.   On: 04/05/2016 12:47   Dg Chest 2 View  Result Date: 03/31/2016 CLINICAL DATA:  Right-sided chest pain EXAM: CHEST  2 VIEW COMPARISON:  05/08/2011 FINDINGS: Cardiac shadow is mildly enlarged. There is increased density projecting in the superior segment of the right lower lobe consistent with pneumonia. Fullness in the hilar region is noted as well as in the right peritracheal region. No other focal abnormality is seen. No bony abnormality is noted. IMPRESSION: Changes in the superior segment of the right lower lobe likely related to acute pneumonia and reactive lymphadenopathy. Followup PA and lateral chest X-ray is recommended in 3-4 weeks following trial of antibiotic therapy to ensure resolution and exclude underlying malignancy. Electronically Signed   By: Inez Catalina M.D.   On: 03/31/2016 11:39   Ct Angio Chest Pe W/cm &/or Wo Cm  Result Date: 03/31/2016 CLINICAL DATA:  Patient with intermittent chest pain. Consolidation within the right lung on recent chest radiograph. EXAM: CT ANGIOGRAPHY CHEST WITH CONTRAST TECHNIQUE: Multidetector CT imaging of the chest was performed using the standard protocol during bolus administration of intravenous contrast. Multiplanar CT image reconstructions and MIPs were obtained to evaluate the vascular anatomy. CONTRAST:  100 cc Isovue 370 COMPARISON:  Chest radiograph 03/31/2016;  05/08/2011. FINDINGS: Cardiovascular: Normal heart size. No pericardial effusion. Coronary arterial vascular calcifications. Aorta and main pulmonary artery are normal in caliber. The right upper lobe pulmonary arteries are attenuated coursing over the superior aspect of the large right upper lobe area of consolidation. Within a right middle lobe pulmonary artery (image 145; series 607) there is a thin central filling defect. Mediastinum/Nodes: No axillary lymphadenopathy. There is a 2.6 cm right hilar lymph node (image 51; series 601). There is a 1.9 cm right peritracheal lymph node (image 40; series 601). Multiple prominent sub cm right peritracheal lymph nodes are demonstrated. Normal appearance of the  esophagus. Lungs/Pleura: Expiratory phase imaging. 7 mm left upper lobe pulmonary nodule (image 54; series 606). Dependent atelectasis within the lower lobes bilaterally. 5 mm subpleural right upper lobe nodule (image 55; series 606). Two adjacent large masslike areas of consolidation within the right upper lobe measuring 5.4 x 5.6 cm (image 101; series 607) and 5.1 x 4.9 cm (image 136; series 607). Few pleural based calcifications within the left hemi thorax. Upper Abdomen: Liver is diffusely low in attenuation. Indeterminate 2.9 x 1.7 cm left adrenal nodule. Normal morphology of the stomach. Musculoskeletal: Healed posterior right twelfth rib fracture. Thoracic spine degenerative changes. No aggressive or acute appearing osseous lesions. Marked degenerative changes involving the right shoulder joint. There is a 1.8 cm solid nodule within the right breast (image 62; series 601). Indeterminate 1.1 cm low-attenuation nodule within the right thyroid lobe. Review of the MIP images confirms the above findings. IMPRESSION: Small central filling defect within a right middle lobe pulmonary artery concerning for pulmonary embolism. Two adjacent large masslike areas of consolidation within the right upper lobe with  differential considerations including malignancy or pneumonia. Multiple enlarged right hilar and mediastinal lymph nodes which may be reactive or metastatic in etiology. Additional indeterminate pulmonary nodules as above. Recommend attention on follow-up. Indeterminate 2.9 cm left adrenal nodule. This needs dedicated evaluation with pre and post contrast-enhanced CT or MRI after confirmation of pulmonary process. Indeterminate nodule within the right breast. Recommend dedicated evaluation with mammography. Hepatic steatosis. Critical Value/emergent results were called by telephone at the time of interpretation on 03/31/2016 at 2:49 pm to Dr. Lita Mains , who verbally acknowledged these results. Electronically Signed   By: Lovey Newcomer M.D.   On: 03/31/2016 14:54   Mr Jeri Cos DX Contrast  Result Date: 04/03/2016 CLINICAL DATA:  Suspected primary lung cancer with breast mass and adrenal mass. Evaluate for intracranial metastasis. History of hypertension, diabetes. EXAM: MRI HEAD WITHOUT AND WITH CONTRAST TECHNIQUE: Multiplanar, multiecho pulse sequences of the brain and surrounding structures were obtained without and with intravenous contrast. CONTRAST:  51m MULTIHANCE GADOBENATE DIMEGLUMINE 529 MG/ML IV SOLN COMPARISON:  MRI head Oct 03, 2003 FINDINGS: INTRACRANIAL CONTENTS: No reduced diffusion to suggest acute ischemia, or hypercellular tumor. No susceptibility artifact to suggest hemorrhage. Symmetric basal ganglia mineralization. The ventricles and sulci are normal for patient's age. No suspicious parenchymal signal, masses, mass effect. Patchy supratentorial white matter in pontine FLAIR T2 hyperintensities. No abnormal intraparenchymal or extra-axial enhancement. No abnormal extra-axial fluid collections. No extra-axial masses. Small posterior fossa arachnoid cyst without mass effect. VASCULAR: Normal major intracranial vascular flow voids present at skull base. SKULL AND UPPER CERVICAL SPINE: No abnormal  sellar expansion. Bright T1 bone marrow signal most compatible with osteopenia. No suspicious calvarial bone marrow signal. Craniocervical junction maintained. SINUSES/ORBITS: The mastoid air-cells and included paranasal sinuses are well-aerated.The included ocular globes and orbital contents are non-suspicious. OTHER: None. IMPRESSION: No acute and intracranial process or metastasis. Moderate chronic small vessel ischemic disease. Electronically Signed   By: CElon AlasM.D.   On: 04/03/2016 21:51   [place]  Result Date: 04/02/2016 CLINICAL DATA:  Large right upper lobe lung mass with subcutaneous soft tissue nodule in the right chest wall and left adrenal mass. The patient presents for biopsy of the right chest wall mass. EXAM: ULTRASOUND GUIDED CORE BIOPSY OF RIGHT CHEST WALL MASS MEDICATIONS: 2.0 mg IV Versed; 100 mcg IV Fentanyl Total Moderate Sedation Time: 15 minutes The patient's level of consciousness and physiologic status were continuously  monitored during the procedure by Radiology nursing. PROCEDURE: The procedure, risks, benefits, and alternatives were explained to the patient. Questions regarding the procedure were encouraged and answered. The patient understands and consents to the procedure. A time out was performed prior to initiating the procedure. The right chest wall was prepped with chlorhexidine in a sterile fashion, and a sterile drape was applied covering the operative field. A sterile gown and sterile gloves were used for the procedure. Local anesthesia was provided with 1% Lidocaine. Subcutaneous right chest wall nodule was localized by ultrasound. Core biopsy was performed with a 16 gauge device. A total of 4 separate core biopsy samples were obtained and submitted in formalin. COMPLICATIONS: None. FINDINGS: Oval-shaped hypoechoic solid soft tissue nodule was localized in the subcutaneous fat of the right chest wall measuring approximately 1.5 cm in greatest diameter. Solid  tissue was obtained. IMPRESSION: Ultrasound-guided core biopsy performed of a solid 1.5 cm soft tissue nodule in the right chest wall. Electronically Signed   By: Aletta Edouard M.D.   On: 04/02/2016 11:39   Ct Biopsy  Result Date: 04/05/2016 INDICATION: 70 year old male with a history of lung mass, concerning for primary lung carcinoma. Pulmonary has reviewed the case, and feels there is low yield with bronchoscopy. EXAM: CT BIOPSY MEDICATIONS: None. ANESTHESIA/SEDATION: Moderate (conscious) sedation was employed during this procedure. A total of Versed 2.0 mg and Fentanyl 50 mcg was administered intravenously. Moderate Sedation Time: 10 minutes. The patient's level of consciousness and vital signs were monitored continuously by radiology nursing throughout the procedure under my direct supervision. FLUOROSCOPY TIME:  CT COMPLICATIONS: None PROCEDURE: The procedure, risks, benefits, and alternatives were explained to the patient and the patient's family. Specific risks that were addressed included bleeding, infection, pneumothorax, need for further procedure including chest tube placement, chance of delayed pneumothorax or hemorrhage, hemoptysis, nondiagnostic sample, cardiopulmonary collapse, death. Questions regarding the procedure were encouraged and answered. The patient understands and consents to the procedure. Patient was positioned in the prone position on the CT gantry table and a scout CT of the chest was performed for planning purposes. Once angle of approach was determined, the skin and subcutaneous tissues this scan was prepped and draped in the usual sterile fashion, and a sterile drape was applied covering the operative field. A sterile gown and sterile gloves were used for the procedure. Local anesthesia was provided with 1% Lidocaine. The skin and subcutaneous tissues were infiltrated 1% lidocaine for local anesthesia, and a small stab incision was made with an 11 blade scalpel. Using CT  guidance, a 17 gauge trocar needle was advanced into the right upper lobetarget. After confirmation of the tip, separate 18 gauge core biopsies were performed. These were placed into solution for transportation to the lab. Biosentry Device was deployed. A final CT image was performed. Patient tolerated the procedure well and remained hemodynamically stable throughout. No complications were encountered and no significant blood loss was encounter IMPRESSION: Status post CT-guided biopsy of right upper lobe lesion, with tissue specimen sent to pathology for complete histopathologic analysis. Signed, Dulcy Fanny. Earleen Newport, DO [facility] Radiology Electronically Signed   By: Corrie Mckusick D.O.   On: 04/05/2016 13:52      IMPRESSION/PLAN: 1. 70 y.o. gentleman with T3 N2 MX adenocarcinoma of the right upper lung. Dr. Tammi Klippel reviews the findings thus far and explains that his staging work up is pending PET and molecular testing. The concern is that the patient either has stage III or  stage IV disease.  We discussed the natural history of non-small cell lung cancer  and general treatment, highlighting the role of radiotherapy in the management.  We discussed the available radiation techniques, and focused on the details of logistics and delivery. We also discussed the role of palliative radiotherapy in the setting and when it is offered in stage IV patients who are symptomatic. We reviewed the anticipated acute and late sequelae associated with radiation in both settings.  As he lives in [place], he would prefer to receive radiation treatment in [place], and will return in 2-3 weeks to review the findings with Dr. Tammi Klippel.   The above documentation reflects my direct findings during this shared patient visit. Please see the separate note by Dr. Tammi Klippel on this date for the remainder of the patient's plan of care.   Carola Rhine, PAC   This document serves as  a record of services personally performed by Tyler Pita, MD and Shona Simpson, PA-C. It was created on his behalf by Arlyce Harman, a trained medical scribe. The creation of this record is based on the scribe's personal observations and the provider's statements to them. This document has been checked and approved by the attending provider.

## 2016-04-11 NOTE — Progress Notes (Signed)
[place] Clinical Social Work  Clinical Social Work met with patient/family and Futures trader at [facility] appointment to offer support and assess for psychosocial needs.  Medical oncologist reviewed patient's diagnosis and recommended PET scan for treatment options with patient/family. Mr. Huser was accompanied by his daughter, [name].  [name] lives in [place], about 40 minutes away from patient.  Mr. Tippin shared he has friends that live nearby, but none that "will check on me because they have their own lives".   The patient's information concerns were addressed adequately at this time.  Mr. Beach expressed he feels overwhelmed and was shocked to hear "stage 4".  CSW and patient reviewed the several options that were shared by the medical oncologist.  The patient shared he does not have all anxiety/depression, he indicated emotional concerns only relating to feeling nervous about talking with the doctor today.  CSW encouraged patient to follow up with CSW if he continues to feel nervous or upset.  ONCBCN DISTRESS SCREENING 04/11/2016  Screening Type Initial Screening  Distress experienced in past week (1-10) 8  Emotional problem type Depression;Nervousness/Anxiety;Adjusting to illness;Isolation/feeling alone;Feeling hopeless;Boredom;Adjusting to appearance changes  Information Concerns Type Lack of info about diagnosis;Lack of info about treatment;Lack of info about complementary therapy choices;Lack of info about maintaining fitness  Physical Problem type Loss of appetitie  Referral to clinical social work Yes     Clinical Social Work briefly discussed Clinical Social Work role and [employer] support programs/services.  Clinical Social Work encouraged patient to call with any additional questions or concerns.   Polo Riley, MSW, LCSW, OSW-C Clinical Social Worker [facility] [phone]

## 2016-04-12 ENCOUNTER — Telehealth: Payer: Self-pay | Admitting: *Deleted

## 2016-04-12 ENCOUNTER — Encounter: Payer: Self-pay | Admitting: *Deleted

## 2016-04-12 NOTE — Progress Notes (Signed)
Oncology Nurse Navigator Documentation  Oncology Nurse Navigator Flowsheets 04/12/2016  Navigator Location [facility]  Navigator Encounter Type Other/I updated demographics and appt's to call daughter with all appt's.   Treatment Phase Pre-Tx/Tx Discussion  Barriers/Navigation Needs Coordination of Care  Interventions Coordination of Care  Coordination of Care Other  Acuity Level 1  Acuity Level 1 Minimal follow up required  Time Spent with Patient 15

## 2016-04-12 NOTE — Telephone Encounter (Signed)
CALLED [facility] TO ARRANGE THIS APPT. FOR THIS PATIENT WITH DR. MANNING IN [place], FAXED NOTES TO ANN IN [facility] , ANN TO ARRANGE AN APPT. WITH THIS PATIENT AND TO GIVE HIM A CALL

## 2016-04-12 NOTE — Progress Notes (Signed)
Oncology Nurse Navigator Documentation  Oncology Nurse Navigator Flowsheets 04/12/2016  Navigator Location [facility]  Navigator Encounter Type Clinic/MDC/spoke with patient and daughter yesterday at thoracic clinic.  Gave and explained information on lung cancer, support and resources at [facility], and next steps.  I contacted pre cert coordinator to get PET authorized.  Per Dr. Julien Nordmann, I contacted [facility] to send tissue obtained on 04/05/16 for foundation one and PDL 1 testing.   Abnormal Finding Date 03/31/2016  Confirmed Diagnosis Date 04/05/2016  Multidisiplinary Clinic Date 04/11/2016  Patient Visit Type MedOnc  Treatment Phase Pre-Tx/Tx Discussion  Barriers/Navigation Needs Coordination of Care;Education  Education Newly Diagnosed Cancer Education  Interventions Coordination of Care;Education  Coordination of Care Other  Education Method Verbal;Written  Acuity Level 2  Acuity Level 2 Assistance expediting appointments;Educational needs  Time Spent with Patient 24

## 2016-04-15 ENCOUNTER — Telehealth: Payer: Self-pay | Admitting: Medical Oncology

## 2016-04-15 NOTE — Telephone Encounter (Signed)
FMLA form faxed to [employer]

## 2016-04-16 ENCOUNTER — Encounter: Payer: Self-pay | Admitting: *Deleted

## 2016-04-16 NOTE — Progress Notes (Signed)
Oncology Nurse Navigator Documentation  Oncology Nurse Navigator Flowsheets 04/16/2016  Navigator Location [facility]  Navigator Encounter Type Other/patient's PEt scan has been scheduled for 04/24/16.  I notified Rad Onc scheduling to schedule with Rad Onc in [place].  I also notified Med Onc scheduling to schedule follow up with Dr. Julien Nordmann after 04/29/16.    Treatment Phase Pre-Tx/Tx Discussion  Barriers/Navigation Needs Coordination of Care  Interventions Coordination of Care  Coordination of Care Appts;Other  Acuity Level 2  Acuity Level 2 Assistance expediting appointments;Other  Time Spent with Patient 30

## 2016-04-18 ENCOUNTER — Encounter: Payer: Self-pay | Admitting: Radiation Oncology

## 2016-04-18 DIAGNOSIS — E1165 Type 2 diabetes mellitus with hyperglycemia: Secondary | ICD-10-CM | POA: Diagnosis not present

## 2016-04-18 DIAGNOSIS — J189 Pneumonia, unspecified organism: Secondary | ICD-10-CM | POA: Diagnosis not present

## 2016-04-18 DIAGNOSIS — C3491 Malignant neoplasm of unspecified part of right bronchus or lung: Secondary | ICD-10-CM | POA: Diagnosis not present

## 2016-04-18 NOTE — Progress Notes (Signed)
Message sent from navigator to refer patient to [facility] office, referral forwarded to [facility] confirmation received 12/14

## 2016-04-22 ENCOUNTER — Encounter: Payer: Self-pay | Admitting: *Deleted

## 2016-04-22 ENCOUNTER — Telehealth: Payer: Self-pay | Admitting: Medical Oncology

## 2016-04-22 NOTE — Progress Notes (Signed)
Oncology Nurse Navigator Documentation  Oncology Nurse Navigator Flowsheets 04/22/2016  Navigator Location [facility]  Navigator Encounter Type Other/I followed up with Darrell Christensen's appt and follow up.  He has his PET scan on 04/24/16, to be seen in [place] for radiation appt and SIM on 05/01/16, and then a follow up with Darrell Christensen on 05/02/16.  I called [facility] to check on molecular testing and PDL 1.  I was told testing will be completed on 04/30/16.    Treatment Phase Pre-Tx/Tx Discussion  Barriers/Navigation Needs Coordination of Care  Interventions Coordination of Care  Coordination of Care Appts  Acuity Level 1  Acuity Level 1 Minimal follow up required  Time Spent with Patient 30

## 2016-04-22 NOTE — Telephone Encounter (Signed)
Pt has cancelled xrt consult x2 -he wants to get PET and see [facility] before going to xrt. He is scheduled for xrt jan 3trh.

## 2016-04-22 NOTE — Telephone Encounter (Signed)
No, I would see him after the scan.

## 2016-04-24 ENCOUNTER — Encounter: Payer: Self-pay | Admitting: *Deleted

## 2016-04-24 ENCOUNTER — Ambulatory Visit (HOSPITAL_COMMUNITY)
Admission: RE | Admit: 2016-04-24 | Discharge: 2016-04-24 | Disposition: A | Discharge status: Home / Self Care | Payer: Medicare Other | Source: Ambulatory Visit | Attending: Internal Medicine | Admitting: Internal Medicine

## 2016-04-24 ENCOUNTER — Telehealth: Payer: Self-pay | Admitting: *Deleted

## 2016-04-24 DIAGNOSIS — E279 Disorder of adrenal gland, unspecified: Secondary | ICD-10-CM | POA: Insufficient documentation

## 2016-04-24 DIAGNOSIS — C778 Secondary and unspecified malignant neoplasm of lymph nodes of multiple regions: Secondary | ICD-10-CM | POA: Insufficient documentation

## 2016-04-24 DIAGNOSIS — I700 Atherosclerosis of aorta: Secondary | ICD-10-CM | POA: Diagnosis not present

## 2016-04-24 DIAGNOSIS — C3411 Malignant neoplasm of upper lobe, right bronchus or lung: Secondary | ICD-10-CM | POA: Insufficient documentation

## 2016-04-24 DIAGNOSIS — C499 Malignant neoplasm of connective and soft tissue, unspecified: Secondary | ICD-10-CM | POA: Diagnosis not present

## 2016-04-24 DIAGNOSIS — N400 Enlarged prostate without lower urinary tract symptoms: Secondary | ICD-10-CM | POA: Insufficient documentation

## 2016-04-24 DIAGNOSIS — I2510 Atherosclerotic heart disease of native coronary artery without angina pectoris: Secondary | ICD-10-CM | POA: Diagnosis not present

## 2016-04-24 DIAGNOSIS — E278 Other specified disorders of adrenal gland: Secondary | ICD-10-CM

## 2016-04-24 LAB — GLUCOSE, CAPILLARY: Glucose-Capillary: 97 mg/dL (ref 65–99)

## 2016-04-24 MED ORDER — FLUDEOXYGLUCOSE F - 18 (FDG) INJECTION
11.6100 | Freq: Once | INTRAVENOUS | Status: AC | PRN
  Administered 2016-04-24: 11.61 via INTRAVENOUS

## 2016-04-24 NOTE — Progress Notes (Signed)
Oncology Nurse Navigator Documentation  Oncology Nurse Navigator Flowsheets 04/24/2016  Navigator Location [facility]  Navigator Encounter Type Other/updated Dr. Julien Nordmann and Dr. Tammi Klippel of PET scan results.    Treatment Phase Pre-Tx/Tx Discussion  Barriers/Navigation Needs Coordination of Care  Interventions Coordination of Care  Acuity Level 1  Acuity Level 1 Minimal follow up required  Time Spent with Patient 30

## 2016-04-24 NOTE — Telephone Encounter (Signed)
Call from pt reporting he has been unable to get his Xarelto refilled. He has contacted PCP and pharmacy but is "getting the run around." Discussed with Dr. Julien Nordmann: Refills after hospitalization should go to PCP. Have pt contact PCP. Left message instructing pt to call PCP for refill of Xarelto for PE. This RN also left message at PCP informing them refill will need to come from PCP.

## 2016-04-25 ENCOUNTER — Telehealth: Payer: Self-pay | Admitting: Internal Medicine

## 2016-04-25 NOTE — Telephone Encounter (Signed)
Added lab to 12/28 f/u. Left message for patient and mailed schedule.

## 2016-04-26 ENCOUNTER — Encounter (HOSPITAL_COMMUNITY): Payer: Self-pay

## 2016-05-02 ENCOUNTER — Encounter: Payer: Self-pay | Admitting: Internal Medicine

## 2016-05-02 ENCOUNTER — Encounter (HOSPITAL_COMMUNITY): Payer: Self-pay

## 2016-05-02 ENCOUNTER — Ambulatory Visit (HOSPITAL_BASED_OUTPATIENT_CLINIC_OR_DEPARTMENT_OTHER): Payer: Medicare Other | Admitting: Internal Medicine

## 2016-05-02 ENCOUNTER — Other Ambulatory Visit (HOSPITAL_BASED_OUTPATIENT_CLINIC_OR_DEPARTMENT_OTHER): Payer: Medicare Other

## 2016-05-02 VITALS — BP 133/80 | HR 95 | Temp 98.1°F | Resp 18 | Wt 225.1 lb

## 2016-05-02 DIAGNOSIS — I2692 Saddle embolus of pulmonary artery without acute cor pulmonale: Secondary | ICD-10-CM

## 2016-05-02 DIAGNOSIS — E119 Type 2 diabetes mellitus without complications: Secondary | ICD-10-CM | POA: Diagnosis not present

## 2016-05-02 DIAGNOSIS — Z7901 Long term (current) use of anticoagulants: Secondary | ICD-10-CM

## 2016-05-02 DIAGNOSIS — C3411 Malignant neoplasm of upper lobe, right bronchus or lung: Secondary | ICD-10-CM

## 2016-05-02 DIAGNOSIS — C499 Malignant neoplasm of connective and soft tissue, unspecified: Secondary | ICD-10-CM

## 2016-05-02 DIAGNOSIS — E118 Type 2 diabetes mellitus with unspecified complications: Secondary | ICD-10-CM

## 2016-05-02 DIAGNOSIS — C7972 Secondary malignant neoplasm of left adrenal gland: Secondary | ICD-10-CM

## 2016-05-02 DIAGNOSIS — Z86711 Personal history of pulmonary embolism: Secondary | ICD-10-CM

## 2016-05-02 DIAGNOSIS — E278 Other specified disorders of adrenal gland: Secondary | ICD-10-CM

## 2016-05-02 DIAGNOSIS — I10 Essential (primary) hypertension: Secondary | ICD-10-CM | POA: Diagnosis not present

## 2016-05-02 HISTORY — DX: Type 2 diabetes mellitus without complications: E11.9

## 2016-05-02 LAB — CBC WITH DIFFERENTIAL/PLATELET
BASO%: 1 % (ref 0.0–2.0)
Basophils Absolute: 0.2 10*3/uL — ABNORMAL HIGH (ref 0.0–0.1)
EOS%: 9.9 % — ABNORMAL HIGH (ref 0.0–7.0)
Eosinophils Absolute: 1.6 10*3/uL — ABNORMAL HIGH (ref 0.0–0.5)
HCT: 41 % (ref 38.4–49.9)
HGB: 13.5 g/dL (ref 13.0–17.1)
LYMPH%: 21 % (ref 14.0–49.0)
MCH: 28.3 pg (ref 27.2–33.4)
MCHC: 33 g/dL (ref 32.0–36.0)
MCV: 85.7 fL (ref 79.3–98.0)
MONO#: 1.2 10*3/uL — ABNORMAL HIGH (ref 0.1–0.9)
MONO%: 7.2 % (ref 0.0–14.0)
NEUT#: 9.9 10*3/uL — ABNORMAL HIGH (ref 1.5–6.5)
NEUT%: 60.9 % (ref 39.0–75.0)
Platelets: 436 10*3/uL — ABNORMAL HIGH (ref 140–400)
RBC: 4.79 10*6/uL (ref 4.20–5.82)
RDW: 13.9 % (ref 11.0–14.6)
WBC: 16.3 10*3/uL — ABNORMAL HIGH (ref 4.0–10.3)
lymph#: 3.4 10*3/uL — ABNORMAL HIGH (ref 0.9–3.3)

## 2016-05-02 LAB — COMPREHENSIVE METABOLIC PANEL
ALT: 14 U/L (ref 0–55)
AST: 12 U/L (ref 5–34)
Albumin: 3.1 g/dL — ABNORMAL LOW (ref 3.5–5.0)
Alkaline Phosphatase: 73 U/L (ref 40–150)
Anion Gap: 10 mEq/L (ref 3–11)
BUN: 10.6 mg/dL (ref 7.0–26.0)
CO2: 24 mEq/L (ref 22–29)
Calcium: 9.2 mg/dL (ref 8.4–10.4)
Chloride: 105 mEq/L (ref 98–109)
Creatinine: 1 mg/dL (ref 0.7–1.3)
EGFR: 76 mL/min/{1.73_m2} — ABNORMAL LOW (ref >90)
Glucose: 103 mg/dl (ref 70–140)
Potassium: 4.2 mEq/L (ref 3.5–5.1)
Sodium: 139 mEq/L (ref 136–145)
Total Bilirubin: 0.42 mg/dL (ref 0.20–1.20)
Total Protein: 7.5 g/dL (ref 6.4–8.3)

## 2016-05-02 NOTE — Progress Notes (Signed)
[place] [phone]   Fax:([phone]) [place], [place] [address]  DIAGNOSIS: Stage IV (T3, N2, M1b) non-small cell lung cancer, adenocarcinoma presented with 2 masses in the right upper lobe, mediastinal lymphadenopathy and left adrenal metastases diagnosed in November 2017.  Genomic Alterations Identified? KRAS P38S CREBBP splice site [id number]>[id number] BHA1P S515* FXT02 I09* BDZ32 splice site [id number] SPTA1 splice site [id number]>[id number] QJ19 C135Y Additional Findings? Microsatellite status MS-Stable Tumor Mutation Burden TMB-Intermediate; 14 Muts/Mb Additional Disease-relevant Genes with No Reportable Alterations Identified? EGFR ALK BRAF MET ERBB2 RET ROS1  PRIOR THERAPY: None.  CURRENT THERAPY: None  INTERVAL HISTORY: Darrell Christensen 70 y.o. male returns to the clinic today for follow-up visit accompanied by his daughter and brother. The patient was recently diagnosed with metastatic non-small cell lung cancer, adenocarcinoma as well as sarcoma of the chest wall. He underwent several studies recently including a PET scan as well as molecular studies by [employer] [facility]. He is here today for evaluation and discussion of his treatment options based on the recent staging workup and molecular studies. He is feeling fine today with no specific complaints except for mild pain on the right chest wall. He has no significant weight loss or night sweats. He has no fever or chills. He continues to have shortness of breath with exertion with mild cough but no hemoptysis.  MEDICAL HISTORY: Past Medical History:  Diagnosis Date  . Adrenal mass, left ([place]) 04/01/2016  . Diabetes mellitus without complication ([place])   . Hypertension   . Mass of breast, right 04/01/2016  . Mass of upper lobe of right lung 04/01/2016   2 adjacent, 5 cm masses, hilar adenopathy    ALLERGIES:  has No Known  Allergies.  MEDICATIONS:  Current Outpatient Prescriptions  Medication Sig Dispense Refill  . ALPRAZolam (XANAX) 1 MG tablet Take 1 mg by mouth 3 (three) times daily as needed for anxiety.    Marland Kitchen amLODipine (NORVASC) 2.5 MG tablet Take 2.5 mg by mouth daily.  4  . diclofenac sodium (VOLTAREN) 1 % GEL Apply 4 g topically 4 (four) times daily as needed (back pain). 1 Tube 0  . docusate sodium (COLACE) 100 MG capsule Take 1 capsule (100 mg total) by mouth 2 (two) times daily. 10 capsule 0  . GLIPIZIDE XL 5 MG 24 hr tablet Take 5 mg by mouth daily.  4  . metFORMIN (GLUCOPHAGE) 500 MG tablet Take 500 mg by mouth 2 (two) times daily.  4  . metoprolol succinate (TOPROL-XL) 25 MG 24 hr tablet Take 1 tablet (25 mg total) by mouth every evening. 30 tablet 0  . ondansetron (ZOFRAN) 4 MG tablet Take 1 tablet (4 mg total) by mouth every 6 (six) hours as needed for nausea. 20 tablet 0  . Rivaroxaban (XARELTO) 15 MG TABS tablet Take 1 tablet (15 mg total) by mouth 2 (two) times daily with a meal. 41 tablet 0  . simvastatin (ZOCOR) 20 MG tablet Take 20 mg by mouth every evening.  5   No current facility-administered medications for this visit.     SURGICAL HISTORY: History reviewed. No pertinent surgical history.  REVIEW OF SYSTEMS:  Constitutional: positive for fatigue Eyes: negative Ears, nose, mouth, throat, and face: negative Respiratory: positive for cough and pleurisy/chest pain Cardiovascular: negative Gastrointestinal: negative Genitourinary:negative Integument/breast: negative Hematologic/lymphatic: negative Musculoskeletal:negative Neurological: negative Behavioral/Psych: negative Endocrine: negative Allergic/Immunologic: negative   PHYSICAL EXAMINATION: General  appearance: alert, cooperative, fatigued and no distress Head: Normocephalic, without obvious abnormality, atraumatic Neck: no adenopathy, no JVD, supple, symmetrical, trachea midline and thyroid not enlarged, symmetric, no  tenderness/mass/nodules Lymph nodes: Cervical, supraclavicular, and axillary nodes normal. Resp: wheezes bilaterally Back: symmetric, no curvature. ROM normal. No CVA tenderness. Cardio: regular rate and rhythm, S1, S2 normal, no murmur, click, rub or gallop GI: soft, non-tender; bowel sounds normal; no masses,  no organomegaly Extremities: extremities normal, atraumatic, no cyanosis or edema Neurologic: Alert and oriented X 3, normal strength and tone. Normal symmetric reflexes. Normal coordination and gait  ECOG PERFORMANCE STATUS: 1 - Symptomatic but completely ambulatory  Blood pressure 133/80, pulse 95, temperature 98.1 F (36.7 C), temperature source Oral, resp. rate 18, weight 225 lb 1.6 oz (102.1 kg), SpO2 100 %.  LABORATORY DATA: Lab Results  Component Value Date   WBC 16.3 (H) 05/02/2016   HGB 13.5 05/02/2016   HCT 41.0 05/02/2016   MCV 85.7 05/02/2016   PLT 436 (H) 05/02/2016      Chemistry      Component Value Date/Time   NA 139 05/02/2016 0852   K 4.2 05/02/2016 0852   CL 103 04/05/2016 0848   CO2 24 05/02/2016 0852   BUN 10.6 05/02/2016 0852   CREATININE 1.0 05/02/2016 0852      Component Value Date/Time   CALCIUM 9.2 05/02/2016 0852   ALKPHOS 73 05/02/2016 0852   AST 12 05/02/2016 0852   ALT 14 05/02/2016 0852   BILITOT 0.42 05/02/2016 0852       RADIOGRAPHIC STUDIES: Dg Chest 1 View  Result Date: 04/05/2016 CLINICAL DATA:  Status post lung biopsy EXAM: CHEST 1 VIEW COMPARISON:  March 31, 2016 FINDINGS: No pneumothorax. The mass arising from the posterior aspect of the right upper lobe appears grossly stable. Lungs elsewhere clear. Heart size and pulmonary vascularity are normal. There is atherosclerotic calcification in the aorta. Adenopathy is better seen on CT than on current examination. There is advanced arthropathy in the left shoulder region. IMPRESSION: Large right upper lobe mass. No pneumothorax post biopsy. No new opacity. Stable cardiac  silhouette. There is aortic atherosclerosis. Electronically Signed   By: Lowella Grip III M.D.   On: 04/05/2016 12:47   Mr Jeri Cos RU Contrast  Result Date: 04/03/2016 CLINICAL DATA:  Suspected primary lung cancer with breast mass and adrenal mass. Evaluate for intracranial metastasis. History of hypertension, diabetes. EXAM: MRI HEAD WITHOUT AND WITH CONTRAST TECHNIQUE: Multiplanar, multiecho pulse sequences of the brain and surrounding structures were obtained without and with intravenous contrast. CONTRAST:  35m MULTIHANCE GADOBENATE DIMEGLUMINE 529 MG/ML IV SOLN COMPARISON:  MRI head Oct 03, 2003 FINDINGS: INTRACRANIAL CONTENTS: No reduced diffusion to suggest acute ischemia, or hypercellular tumor. No susceptibility artifact to suggest hemorrhage. Symmetric basal ganglia mineralization. The ventricles and sulci are normal for patient's age. No suspicious parenchymal signal, masses, mass effect. Patchy supratentorial white matter in pontine FLAIR T2 hyperintensities. No abnormal intraparenchymal or extra-axial enhancement. No abnormal extra-axial fluid collections. No extra-axial masses. Small posterior fossa arachnoid cyst without mass effect. VASCULAR: Normal major intracranial vascular flow voids present at skull base. SKULL AND UPPER CERVICAL SPINE: No abnormal sellar expansion. Bright T1 bone marrow signal most compatible with osteopenia. No suspicious calvarial bone marrow signal. Craniocervical junction maintained. SINUSES/ORBITS: The mastoid air-cells and included paranasal sinuses are well-aerated.The included ocular globes and orbital contents are non-suspicious. OTHER: None. IMPRESSION: No acute and intracranial process or metastasis. Moderate chronic small vessel ischemic disease. Electronically  Signed   By: Elon Alas M.D.   On: 04/03/2016 21:51   Nm Pet Image Initial (pi) Skull Base To Thigh  Result Date: 04/24/2016 CLINICAL DATA:  Initial treatment strategy for right upper  lobe lung cancer. EXAM: NUCLEAR MEDICINE PET SKULL BASE TO THIGH TECHNIQUE: 11.6 mCi F-18 FDG was injected intravenously. Full-ring PET imaging was performed from the skull base to thigh after the radiotracer. CT data was obtained and used for attenuation correction and anatomic localization. FASTING BLOOD GLUCOSE:  Value: 97 mg/dl COMPARISON:  Chest CT dated 03/31/2016 FINDINGS: NECK No hypermetabolic lymph nodes in the neck. CHEST Confluent right upper lobe mass approximately 8.8 by 6.3 cm, maximum SUV 50.3. No chest wall invasion identified. Right paratracheal lymph node 2.1 cm in short axis on image 64/4, maximum SUV 62.3. Right hilar lymph node approximately 2 cm in diameter, maximum SUV 34.6. Small right pleural effusion is faintly hypermetabolic, maximum SUV 4.3. 6 mm left upper lobe pulmonary nodule on image 28/8, blurred associated metabolic activity with maximum SUV 3.0. Coronary, aortic arch, and branch vessel atherosclerotic vascular disease. ABDOMEN/PELVIS 1.7 by 2.8 cm left adrenal mass, maximum SUV 5.6. Precontrast density 15 Hounsfield units. Right adrenal activity 5.0, but without definite CT correlate. Posteriorly in the left abdomen in the adipose tissues near the descending colon there is a 2.3 by 1.9 cm tumor implant with maximum SUV 28.4. There is also some focal high activity in the adjacent colon which could be from peristalsis but with maximum SUV 11.6. Aortoiliac atherosclerotic vascular disease. Enlarged prostate gland without definite associated hypermetabolic activity. SKELETON Focal high activity in the right subscapularis muscle without a well-defined CT correlate but with maximum SUV 24.9. In the subcutaneous tissues of the right breast there is a 1.4 by 1.6 cm soft tissue density nodule with maximum SUV 3.0 Deformity of the left proximal humerus and left glenohumeral joint related to prior trauma. Left posterior subcutaneous nodule along the upper buttock just above the level of the  iliac crests, only measuring 7 mm in diameter but with maximum SUV 16.3. IMPRESSION: 1. Prominently hypermetabolic large right upper lobe mass with metastatic disease to the right paratracheal and right hilar nodal chains, to the descending mesocolon, to the left upper buttock subcutaneous tissues, to the left upper lobe, to the right subscapularis muscle, and possibly to the left adrenal gland and right breast subcutaneous tissues. Appearance compatible with stage IV lung cancer. 2. There is some focal high activity in the ascending colon which is probably from peristalsis, less likely from a local colon mass. This does raise the possibility that the adjacent mesocolon tumor implant could be due to synchronous colon cancer rather than lung metastasis. 3. Other imaging findings of potential clinical significance: Coronary, aortic arch, and branch vessel atherosclerotic vascular disease. Aortoiliac atherosclerotic vascular disease. Deformity left proximal humerus and left glenohumeral joint from prior trauma. Enlarged prostate gland. Electronically Signed   By: Van Clines M.D.   On: 04/24/2016 13:06   US Biopsy  Result Date: 04/02/2016 CLINICAL DATA:  Large right upper lobe lung mass with subcutaneous soft tissue nodule in the right chest wall and left adrenal mass. The patient presents for biopsy of the right chest wall mass. EXAM: ULTRASOUND GUIDED CORE BIOPSY OF RIGHT CHEST WALL MASS MEDICATIONS: 2.0 mg IV Versed; 100 mcg IV Fentanyl Total Moderate Sedation Time: 15 minutes The patient's level of consciousness and physiologic status were continuously monitored during the procedure by Radiology nursing. PROCEDURE: The procedure, risks, benefits,  and alternatives were explained to the patient. Questions regarding the procedure were encouraged and answered. The patient understands and consents to the procedure. A time out was performed prior to initiating the procedure. The right chest wall was prepped  with chlorhexidine in a sterile fashion, and a sterile drape was applied covering the operative field. A sterile gown and sterile gloves were used for the procedure. Local anesthesia was provided with 1% Lidocaine. Subcutaneous right chest wall nodule was localized by ultrasound. Core biopsy was performed with a 16 gauge device. A total of 4 separate core biopsy samples were obtained and submitted in formalin. COMPLICATIONS: None. FINDINGS: Oval-shaped hypoechoic solid soft tissue nodule was localized in the subcutaneous fat of the right chest wall measuring approximately 1.5 cm in greatest diameter. Solid tissue was obtained. IMPRESSION: Ultrasound-guided core biopsy performed of a solid 1.5 cm soft tissue nodule in the right chest wall. Electronically Signed   By: Aletta Edouard M.D.   On: 04/02/2016 11:39   Ct Biopsy  Result Date: 04/05/2016 INDICATION: 70 year old male with a history of lung mass, concerning for primary lung carcinoma. Pulmonary has reviewed the case, and feels there is low yield with bronchoscopy. EXAM: CT BIOPSY MEDICATIONS: None. ANESTHESIA/SEDATION: Moderate (conscious) sedation was employed during this procedure. A total of Versed 2.0 mg and Fentanyl 50 mcg was administered intravenously. Moderate Sedation Time: 10 minutes. The patient's level of consciousness and vital signs were monitored continuously by radiology nursing throughout the procedure under my direct supervision. FLUOROSCOPY TIME:  CT COMPLICATIONS: None PROCEDURE: The procedure, risks, benefits, and alternatives were explained to the patient and the patient's family. Specific risks that were addressed included bleeding, infection, pneumothorax, need for further procedure including chest tube placement, chance of delayed pneumothorax or hemorrhage, hemoptysis, nondiagnostic sample, cardiopulmonary collapse, death. Questions regarding the procedure were encouraged and answered. The patient understands and consents to the  procedure. Patient was positioned in the prone position on the CT gantry table and a scout CT of the chest was performed for planning purposes. Once angle of approach was determined, the skin and subcutaneous tissues this scan was prepped and draped in the usual sterile fashion, and a sterile drape was applied covering the operative field. A sterile gown and sterile gloves were used for the procedure. Local anesthesia was provided with 1% Lidocaine. The skin and subcutaneous tissues were infiltrated 1% lidocaine for local anesthesia, and a small stab incision was made with an 11 blade scalpel. Using CT guidance, a 17 gauge trocar needle was advanced into the right upper lobetarget. After confirmation of the tip, separate 18 gauge core biopsies were performed. These were placed into solution for transportation to the lab. Biosentry Device was deployed. A final CT image was performed. Patient tolerated the procedure well and remained hemodynamically stable throughout. No complications were encountered and no significant blood loss was encounter IMPRESSION: Status post CT-guided biopsy of right upper lobe lesion, with tissue specimen sent to pathology for complete histopathologic analysis. Signed, Dulcy Fanny. Earleen Newport, DO [facility] Radiology Electronically Signed   By: Corrie Mckusick D.O.   On: 04/05/2016 13:52    ASSESSMENT AND PLAN: This is a very pleasant 70 years old white male recently diagnosed with a stage IV (T3, N2, M1 B) non-small cell lung cancer, adenocarcinoma with no actionable mutations. The patient also has sarcoma of the right chest wall. I personally and independently reviewed the images of the scan and discuss the results with the patient and  his family. I had a lengthy discussion with the patient and his family today about his current disease stage, prognosis and treatment options. I explained to the patient that he has incurable condition and also  treatment will be palliative nature. I discussed with him the option of palliative care versus consideration of systemic chemotherapy with carboplatin for AUC of 5, Alimta 500 MG/M2 and Avastin 15 MG/KG every 3 weeks. I discussed with the patient adverse effects of this treatment including but not limited to alopecia, myelosuppression, nausea and vomiting, peripheral neuropathy, liver or renal dysfunction. He was also seen by Dr. Sondra Come and was considered for palliative radiotherapy to the large right upper lobe lung mass. He was supposed to receive his treatment in [facility] but he has been declining the appointment. He would like to receive his systemic treatment close to home at [facility]. I will arrange for the patient to see Dr. Whitney Muse at [facility] for management of his condition. For hypertension, he will continue on metoprolol and Norvasc. For diabetes mellitus, the patient is currently on metformin. For the recent history of pulmonary embolism, the patient will continue treatment with Xarelto. He was advised to call immediately if he has any concerning symptoms in the interval until he sees Dr. Whitney Muse. The patient voices understanding of current disease status and treatment options and is in agreement with the current care plan.  All questions were answered. The patient knows to call the clinic with any problems, questions or concerns. We can certainly see the patient much sooner if necessary.  Disclaimer: This note was dictated with voice recognition software. Similar sounding words can inadvertently be transcribed and may not be corrected upon review.

## 2016-05-07 ENCOUNTER — Encounter (HOSPITAL_COMMUNITY): Payer: Self-pay | Admitting: Oncology

## 2016-05-07 ENCOUNTER — Encounter (HOSPITAL_COMMUNITY): Payer: Medicare Other | Attending: Oncology | Admitting: Oncology

## 2016-05-07 VITALS — BP 140/82 | HR 120 | Resp 36 | Ht 68.25 in | Wt 218.4 lb

## 2016-05-07 DIAGNOSIS — Z7901 Long term (current) use of anticoagulants: Secondary | ICD-10-CM

## 2016-05-07 DIAGNOSIS — C499 Malignant neoplasm of connective and soft tissue, unspecified: Secondary | ICD-10-CM

## 2016-05-07 DIAGNOSIS — I2699 Other pulmonary embolism without acute cor pulmonale: Secondary | ICD-10-CM | POA: Diagnosis not present

## 2016-05-07 DIAGNOSIS — C496 Malignant neoplasm of connective and soft tissue of trunk, unspecified: Secondary | ICD-10-CM

## 2016-05-07 DIAGNOSIS — C3411 Malignant neoplasm of upper lobe, right bronchus or lung: Secondary | ICD-10-CM | POA: Diagnosis not present

## 2016-05-07 DIAGNOSIS — C7972 Secondary malignant neoplasm of left adrenal gland: Secondary | ICD-10-CM

## 2016-05-07 DIAGNOSIS — I2692 Saddle embolus of pulmonary artery without acute cor pulmonale: Secondary | ICD-10-CM

## 2016-05-07 MED ORDER — METOPROLOL SUCCINATE ER 25 MG PO TB24: 25.0000 mg | ORAL_TABLET | Freq: Every evening | ORAL | 0 refills | Status: DC

## 2016-05-07 NOTE — Assessment & Plan Note (Addendum)
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  Will need to follow with future imaging.  If patient does well with treatment, this will need to be addressed in the future.

## 2016-05-07 NOTE — Patient Instructions (Addendum)
[place] at [facility] Discharge Instructions  You were seen today by Kirby Crigler PA-C and Dr. Whitney Muse. We are referring you to Dr. Arnoldo Morale Dr. Rosana Hoes for port placement. Shellia Carwin to do chemo teaching and she will schedule your follow up.   RECOMMENDATIONS MADE BY THE CONSULTANT AND ANY TEST RESULTS WILL BE SENT TO YOUR REFERRING PHYSICIAN.    Thank you for choosing [place] at [facility] to provide your oncology and hematology care.  To afford each patient quality time with our provider, please arrive at least 15 minutes before your scheduled appointment time.    If you have a lab appointment with the [facility] please come in thru the  Main Entrance and check in at the main information desk  You need to re-schedule your appointment should you arrive 10 or more minutes late.  We strive to give you quality time with our providers, and arriving late affects you and other patients whose appointments are after yours.  Also, if you no show three or more times for appointments you may be dismissed from the clinic at the providers discretion.     Again, thank you for choosing [facility].  Our hope is that these requests will decrease the amount of time that you wait before being seen by our physicians.       _____________________________________________________________  Should you have questions after your visit to [facility], please contact our office at [phone] between the hours of 8:30 a.m. and 4:30 p.m.  Voicemails left after 4:30 p.m. will not be returned until the following business day.  For prescription refill requests, have your pharmacy contact our office.       Resources For Cancer Patients and their Caregivers ? American Cancer Society: Can assist with transportation, wigs, general needs, runs Look Good Feel Better.        (401)344-3181 ? Cancer Care: Provides financial assistance,  online support groups, medication/co-pay assistance.  1-800-813-HOPE 850-271-8229) ? [place] Assists [place] cancer patients and their families through emotional , educational and financial support.  567-590-8958 ? [place] DSS Where to apply for food stamps, Medicaid and utility assistance. 831-361-7544 ? [employer]: Transportation to medical appointments. 226-105-0017 ? Social Security Administration: May apply for disability if have a Stage IV cancer. 850-807-7330 (819) 198-8744 ? [employer], Disability and Transit Services: Assists with nutrition, care and transit needs. [place] Support Programs: '@10RELATIVEDAYS'$ @ > Cancer Support Group  2nd Tuesday of the month 1pm-2pm, Journey Room  > Creative Journey  3rd Tuesday of the month 1130am-1pm, Journey Room  > Look Good Feel Better  1st Wednesday of the month 10am-12 noon, Journey Room (Call [place] to register [phone])

## 2016-05-07 NOTE — Progress Notes (Signed)
Met with pt face to face.  Introduced myself and explain a little bit about my role as the patient navigator.  Pt given a card with all my information on it.  Told pt to call if they had any questions or concerns.  Pt verbalized understanding.   Referral for port to [facility]. Chemo teaching set up for 05/16/2015 at 11:30 am.

## 2016-05-07 NOTE — Assessment & Plan Note (Signed)
PE, right middle lobe, small central filling defect.  On Xarelto anticoagulation.

## 2016-05-07 NOTE — Progress Notes (Signed)
[facility] Hematology/Oncology Consultation   Name: Darrell Christensen      MRN: [id number]      Date: 05/15/2016 Time:5:14 PM   REFERRING PHYSICIAN:  Curt Bears, MD (Medical Oncology)  REASON FOR CONSULT:  Stage IV NSCLC, adenocarcinoma and right chest low grade leiomyosarcoma   DIAGNOSIS:  Stage IV (T3N2M1B) Adenocarcinoma of right upper lobe, mediastinal lymphadenopathy, and left adrenal metastases in addition to a right chest wall low-grade leiomyosarcoma.  HISTORY OF PRESENT ILLNESS:   Darrell Christensen is a 71 y.o. male with a medical history significant for PE on Xarelto, HTN, DM who is referred to the [facility] for Stage IV [facility]) Adenocarcinoma of right upper lobe, mediastinal lymphadenopathy, and left adrenal metastases; NOT biopsy proven Stage IV disease.   PDL1- high expressor 50%. AND Right chest wall low-grade leiomyosarcoma. AND PE, right middle lobe, small central filling defect.    Primary cancer of right upper lobe of lung ([place])   04/01/2016 Initial Diagnosis    Primary cancer of right upper lobe of lung ([place])     04/02/2016 Procedure    Ultrasound-guided core biopsy performed of a solid 1.5 cm soft tissue nodule in the right chest wall.      04/03/2016 Imaging    MRI brain- No acute and intracranial process or metastasis.  Moderate chronic small vessel ischemic disease.      04/04/2016 Pathology Results    Diagnosis Soft Tissue Needle Core Biopsy, Right Chest Wall SMOOTH MUSCLE NEOPLASM Microscopic Comment The differential diagnosis include low grade leiomyosarcoma. The neoplasm shows spindle cell morphology with rare mitotic figures and minimal atypia, no necrosis present. These cells stains positive for smooth muscle actin, desmin, negative for cytokeratin AE1&3, ck8/18, s100, cd117, cd 34 and cd99. This case also reviewed by Dr. [name] and agree.      04/05/2016 Procedure    CT-guided biopsy of right upper lobe  lesion, with tissue specimen sent to pathology, by IR.      04/09/2016 Pathology Results    Lung, needle/core biopsy(ies), Right Upper Lobe - NON SMALL CELL CARCINOMA.      04/17/2016 Pathology Results    PDL1 High Expression.  TPS 50%.      04/24/2016 PET scan    1. Prominently hypermetabolic large right upper lobe mass with metastatic disease to the right paratracheal and right hilar nodal chains, to the descending mesocolon, to the left upper buttock subcutaneous tissues, to the left upper lobe, to the right subscapularis muscle, and possibly to the left adrenal gland and right breast subcutaneous tissues. Appearance compatible with stage IV lung cancer. 2. There is some focal high activity in the ascending colon which is probably from peristalsis, less likely from a local colon mass. This does raise the possibility that the adjacent mesocolon tumor implant could be due to synchronous colon cancer rather than lung metastasis. 3. Other imaging findings of potential clinical significance: Coronary, aortic arch, and branch vessel atherosclerotic vascular disease. Aortoiliac atherosclerotic vascular disease. Deformity left proximal humerus and left glenohumeral joint from prior trauma. Enlarged prostate gland.      04/26/2016 Pathology Results    FoundationONE: Genomic alterations identified- KRAS N05L, CREBBP splice site [id number], LRP1B S515*, PFX90 W40*, XBD53 splice site [id number]>DQ, SPTA1 splice site [id number]+7L>G, TP53 C135Y.  Additional findings- MS-Stable, TMB-intermediate (14 Muts/Mb).  No reportable alterations- EGFR, ALK, BRAF, MET, ERBB2, RET, ROS1.       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  Two adjacent large masslike areas of consolidation within the right upper lobe with differential considerations including malignancy  or pneumonia. Multiple enlarged right hilar and mediastinal lymph nodes which may be reactive or metastatic in etiology.  Additional indeterminate pulmonary nodules as above. Recommend attention on follow-up.  Indeterminate 2.9 cm left adrenal nodule. This needs dedicated evaluation with pre and post contrast-enhanced CT or MRI after confirmation of pulmonary process.  Indeterminate nodule within the right breast. Recommend dedicated evaluation with mammography.  Hepatic steatosis.      04/02/2016 Procedure    Ultrasound-guided core biopsy performed of a solid 1.5 cm soft tissue nodule in the right chest wall.      04/04/2016 Pathology Results    Diagnosis Soft Tissue Needle Core Biopsy, Right Chest Wall SMOOTH MUSCLE NEOPLASM Microscopic Comment The differential diagnosis include low grade leiomyosarcoma. The neoplasm shows spindle cell morphology with rare mitotic figures and minimal atypia, no necrosis present. These cells stains positive for smooth muscle actin, desmin, negative for cytokeratin AE1&3, ck8/18, s100, cd117, cd 34 and cd99. This case also reviewed by Dr. [name] and agree.       He presented to [facility] ED with chest pain which led to his PE diagnosis and other abnormal findings.  Work-up of abnormalities included a breast biopsy demonstrating a low-grade leiomyosarcoma.  Biopsy of RUL mass demonstrated an adenocarcinoma of lung.  Outpatient PET scan was performed illustrating metastatic disease, presumed to be of lung primary, but not biopsy proven.  R chest wall was biopsied but revealed a low grade leiomyosarcoma as previously noted.   He was initially seen by Dr. Julien Nordmann in [facility] but patient wanted to transfer his medical oncology care closer to home.  He is therefore here today for consultation.  He relays and confirms his oncology history.  He reports that he is not educated on his current condition, but his daughter corrects him and reports everything  that Dr. Julien Nordmann has reviewed with the patient.  He confirms that he has Stage IV disease and that this is incurable, but treatable with chemotherapy.    He has refused XRT at this time as he was confused regarding their role in his oncology care.  We discussed this and he is open to a XRT consultation in [place], [place].    I have reviewed options moving forward including palliative/Hospice care versus palliative chemotherapy.  He was educated about Carboplatin/Alimta/Avastin by Dr. Julien Nordmann and we agree that this is an option first line.  I refreshed the patient about the common side effects of this therapy.  I reviewed the role of a port-a-cath in his cancer care.    He does wish to proceed knowing that if treatment is not tolerated well, then transitioning of care to hospice versus trying immunotherapy would be reasonable.  He does have a good performance status at this time.  He reports issues with getting his antihypertensive medication from his primary care provider.  Review of Systems  Constitutional: Positive for weight loss. Negative for chills and fever.  HENT: Negative.   Eyes: Negative for double vision.  Respiratory: Positive for cough and shortness of breath. Negative for sputum production.   Cardiovascular: Negative.  Negative for chest pain.  Gastrointestinal: Negative.  Negative for constipation, diarrhea, nausea and vomiting.  Genitourinary: Negative.   Musculoskeletal: Negative.  Negative for falls.  Skin: Negative.  Negative for rash.  Neurological: Negative.  Negative for weakness.  Psychiatric/Behavioral: Negative.      PAST MEDICAL HISTORY:   Past Medical History:  Diagnosis Date  . Adrenal mass, left ([place]) 04/01/2016  . Diabetes mellitus ([place]) 05/02/2016  . Diabetes mellitus without complication ([place])   . Hypertension   . Mass of breast, right 04/01/2016  . Mass of upper lobe of right lung 04/01/2016   2 adjacent, 5 cm masses, hilar adenopathy  . Primary cancer  of right upper lobe of lung ([place]) 04/01/2016   2 adjacent, 5 cm masses, hilar adenopathy  . Pulmonary embolus ([place]) 03/31/2016  . 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[facility])     ALLERGIES: No Known Allergies    MEDICATIONS: I have reviewed the patient's current medications.    Current Outpatient Prescriptions on File Prior to Visit  Medication Sig Dispense Refill  . ALPRAZolam (XANAX) 1 MG tablet Take 1 mg by mouth 3 (three) times daily as needed for anxiety.    Marland Kitchen amLODipine (NORVASC) 2.5 MG tablet Take 2.5 mg by mouth daily.  4  . GLIPIZIDE XL 5 MG 24 hr tablet Take 5 mg by mouth daily.  4  . metFORMIN (GLUCOPHAGE) 500 MG tablet Take 500 mg by mouth 2 (two) times daily.  4  . simvastatin (ZOCOR) 20 MG tablet Take 20 mg by mouth every evening.  5   No current facility-administered medications on file prior to visit.      PAST SURGICAL HISTORY Past Surgical History:  Procedure Laterality Date  . PORTACATH PLACEMENT Left 05/13/2016   Procedure: INSERTION PORT-A-CATH;  Surgeon: Aviva Signs, MD;  Location: [facility];  Service: General;  Laterality: Left;    FAMILY HISTORY: Mother deceased at the age of 54 from stroke. Father deceased from complications associated with DM at the age of 38. 2 biological brother deceased:  64 years old- deceased from chronic medical issues  72 year old- deceased from internal hemorrhage due to complications associated with EtOHism He has 1 daughter 4 years old with HTN, hypercholesterolemia. 1 son deceased at the age of 22 (71) from a work accident.  SOCIAL HISTORY: Tobacco abuse, quit 2 years ago after smoking 3 ppd x 61 years.  He is now vaping.  He admits to a significant EtOH abuse, quitting 8 years ago.  He admits to daily EtOH abuse.  He denies any illicit drug abuse.  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  He is Baptist in religion, but he admits to not being strong in his faith.  He is married x 2 and divorced x 2.    Social History   Social  History  . Marital status: Divorced    Spouse name: N/A  . Number of children: N/A  . Years of education: N/A   Social History Main Topics  . Smoking status: Former Smoker    Years: 61.00    Types: Cigarettes, E-cigarettes    Quit date: 04/11/2014  . Smokeless tobacco: Never Used     Comment: patient does vape smoking x 2 years  . Alcohol use No     Comment: quit 10 years  . Drug use: No  . Sexual activity: Not Asked   Other Topics Concern  . None   Social History Narrative  . None    PERFORMANCE STATUS: The patient's performance status is 1 - Symptomatic but completely ambulatory  PHYSICAL EXAM: Most Recent Vital Signs: Blood pressure 140/82, pulse (!) 120, resp. rate (!) 36,  height 5' 8.25" (1.734 m), weight 218 lb 6.4 oz (99.1 kg), SpO2 96 %. General appearance: alert, appears older than stated age, no distress, moderately obese and accompanied by daughter Head: Normocephalic, without obvious abnormality, atraumatic Eyes: negative findings: lids and lashes normal, conjunctivae and sclerae normal and corneas clear Nose: Nares normal. Septum midline. Mucosa normal. No drainage or sinus tenderness. Throat: normal findings: lips normal without lesions, buccal mucosa normal, tongue midline and normal and oropharynx pink & moist without lesions or evidence of thrush and abnormal findings: dentition: poor Neck: no adenopathy, supple, symmetrical, trachea midline and thyroid not enlarged, symmetric, no tenderness/mass/nodules Back: symmetric, no curvature. ROM normal. No CVA tenderness., no palpable nodule/mass Lungs: diminished breath sounds bilaterally, rales posterior - bilateral and rhonchi posterior - bilateral Heart: regular rate and rhythm Abdomen: soft, non-tender; bowel sounds normal; no masses,  no organomegaly Extremities: no edema, redness or tenderness in the calves or thighs Skin: Skin color, texture, turgor normal. No rashes or lesions Lymph nodes: Cervical,  supraclavicular, and axillary nodes normal. Neurologic: Grossly normal  LABORATORY DATA:  CBC    Component Value Date/Time   WBC 16.3 (H) 05/02/2016 0852   WBC 14.1 (H) 04/06/2016 0224   RBC 4.79 05/02/2016 0852   RBC 4.61 04/06/2016 0224   HGB 13.5 05/02/2016 0852   HCT 41.0 05/02/2016 0852   PLT 436 (H) 05/02/2016 0852   MCV 85.7 05/02/2016 0852   MCH 28.3 05/02/2016 0852   MCH 28.4 04/06/2016 0224   MCHC 33.0 05/02/2016 0852   MCHC 32.7 04/06/2016 0224   RDW 13.9 05/02/2016 0852   LYMPHSABS 3.4 (H) 05/02/2016 0852   MONOABS 1.2 (H) 05/02/2016 0852   EOSABS 1.6 (H) 05/02/2016 0852   BASOSABS 0.2 (H) 05/02/2016 0852     Chemistry      Component Value Date/Time   NA 139 05/02/2016 0852   K 4.2 05/02/2016 0852   CL 103 04/05/2016 0848   CO2 24 05/02/2016 0852   BUN 10.6 05/02/2016 0852   CREATININE 1.0 05/02/2016 0852      Component Value Date/Time   CALCIUM 9.2 05/02/2016 0852   ALKPHOS 73 05/02/2016 0852   AST 12 05/02/2016 0852   ALT 14 05/02/2016 0852   BILITOT 0.42 05/02/2016 0852       RADIOGRAPHY:  CLINICAL DATA:  Initial treatment strategy for right upper lobe lung cancer.  EXAM: NUCLEAR MEDICINE PET SKULL BASE TO THIGH  TECHNIQUE: 11.6 mCi F-18 FDG was injected intravenously. Full-ring PET imaging was performed from the skull base to thigh after the radiotracer. CT data was obtained and used for attenuation correction and anatomic localization.  FASTING BLOOD GLUCOSE:  Value: 97 mg/dl  COMPARISON:  Chest CT dated 03/31/2016  FINDINGS: NECK  No hypermetabolic lymph nodes in the neck.  CHEST  Confluent right upper lobe mass approximately 8.8 by 6.3 cm, maximum SUV 50.3. No chest wall invasion identified.  Right paratracheal lymph node 2.1 cm in short axis on image 64/4, maximum SUV 62.3. Right hilar lymph node approximately 2 cm in diameter, maximum SUV 34.6. Small right pleural effusion is faintly hypermetabolic, maximum SUV  4.3.  6 mm left upper lobe pulmonary nodule on image 28/8, blurred associated metabolic activity with maximum SUV 3.0.  Coronary, aortic arch, and branch vessel atherosclerotic vascular disease.  ABDOMEN/PELVIS  1.7 by 2.8 cm left adrenal mass, maximum SUV 5.6. Precontrast density 15 Hounsfield units. Right adrenal activity 5.0, but without definite CT correlate.  Posteriorly in the left  abdomen in the adipose tissues near the descending colon there is a 2.3 by 1.9 cm tumor implant with maximum SUV 28.4. There is also some focal high activity in the adjacent colon which could be from peristalsis but with maximum SUV 11.6.  Aortoiliac atherosclerotic vascular disease. Enlarged prostate gland without definite associated hypermetabolic activity.  SKELETON  Focal high activity in the right subscapularis muscle without a well-defined CT correlate but with maximum SUV 24.9. In the subcutaneous tissues of the right breast there is a 1.4 by 1.6 cm soft tissue density nodule with maximum SUV 3.0  Deformity of the left proximal humerus and left glenohumeral joint related to prior trauma.  Left posterior subcutaneous nodule along the upper buttock just above the level of the iliac crests, only measuring 7 mm in diameter but with maximum SUV 16.3.  IMPRESSION: 1. Prominently hypermetabolic large right upper lobe mass with metastatic disease to the right paratracheal and right hilar nodal chains, to the descending mesocolon, to the left upper buttock subcutaneous tissues, to the left upper lobe, to the right subscapularis muscle, and possibly to the left adrenal gland and right breast subcutaneous tissues. Appearance compatible with stage IV lung cancer. 2. There is some focal high activity in the ascending colon which is probably from peristalsis, less likely from a local colon mass. This does raise the possibility that the adjacent mesocolon tumor implant could be due  to synchronous colon cancer rather than lung metastasis. 3. Other imaging findings of potential clinical significance: Coronary, aortic arch, and branch vessel atherosclerotic vascular disease. Aortoiliac atherosclerotic vascular disease. Deformity left proximal humerus and left glenohumeral joint from prior trauma. Enlarged prostate gland.   Electronically Signed   By: Van Clines M.D.   On: 04/24/2016 13:06   PATHOLOGY:    Diagnosis Soft Tissue Needle Core Biopsy, Right Chest Wall SMOOTH MUSCLE NEOPLASM Microscopic Comment The differential diagnosis include low grade leiomyosarcoma. The neoplasm shows spindle cell morphology with rare mitotic figures and minimal atypia, no necrosis present. These cells stains positive for smooth muscle actin, desmin, negative for cytokeratin AE1&3, ck8/18, s100, cd117, cd 34 and cd99. This case also reviewed by Dr. [name] and agree. Casimer Lanius MD Pathologist, Electronic Signature (Case signed 04/04/2016)     REASON FOR ADDENDUM, AMENDMENT OR CORRECTION: [phone]: Immunohistochemical results. 04/09/16 09:35:46 AM (gt) FINAL DIAGNOSIS Diagnosis Lung, needle/core biopsy(ies), Right Upper Lobe - NON SMALL CELL CARCINOMA. Microscopic Comment Immunohistochemistry will be performed and reported and reported as an addendum. (JDP:gt, 04/08/16) ADDENDUM: Immunohistochemistry shows the tumor is strongly positive with thyroid transcription factor-1 and is mostly negative with cytokeratin 5/6. The immunophenotype is consistent with poorly differentiated lung adenocarcinoma. (JDP:gt, 04/09/16) Claudette Laws MD Pathologist, Electronic Signature (Case signed 04/08/2016)   ASSESSMENT/PLAN:   Primary cancer of right upper lobe of lung ([facility]) Stage IV (T3N2M1B) adenocarcinoma of right upper lobe, mediastinal lymphadenopathy, descending mesocolon (synchronous colon cancer), subcutaneous (left upper buttock, right subscapularis muscle)  and left adrenal metastases.  Stage IV disease is NOT biopsy proven but note that attempts to biopsy prove revealed a low grade leiomyosarcoma.  PDL1- high expressor 50%. He is a candidate for [place] front line.   Oncology history developed.  Staging in [facility] problem list completed.  Labs on 05/02/2016 reviewed: CBC diff, CMET.  I personally reviewed and went over laboratory results with the patient.  The results are noted within this dictation.  Leukocytosis and thrombocytosis are noted.  Will refer patient to Gen Surgeon for port placement in anticipation  of future chemotherapy consisting Carboplatin AUC 5/Alimta/Avastin every 21 days as this was discussed with the patient in [place] with Dr. Earlie Server. He is however a candidate for [employer] frontline.  I reviewed the risks, benefits, alternatives, and side effects of systemic chemotherapy including, but not limited to, alopecia, myelosuppression, nausea, vomiting, peripheral neuropathy, liver and renal dysfunction.  I have discussed case with Dr. Arnoldo Morale (Gen Surg) and he has agreed to see the patient beginning of next week for consultation and subsequent port placement.  He was previously referred to Dr. Sondra Come for XRT in [place], but the patient has declined this intervention at this time.  Will refer patient to Nurse Navigator, Anderson Malta, for chemotherapy teaching.  I have refilled his metoprolol.  Return for follow-up either on day 1 of cycle 1 or for NADIR check 7-10 days following cycle 1 of treatment with labs.  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  Will need to follow with future imaging.  If patient does well with treatment, this will need to be addressed in the future.  Pulmonary embolus ([facility]) PE, right middle lobe, small central filling defect.  On Xarelto anticoagulation.  I spoke with the patient  about Keytruda as well. He and his daughter will think about options. I advised them that if he chooses [place] up front he will be restaged and if stable disease or improved he will continue, if he progresses we can move forward with carbo/alimta as discussed.   MEDICATIONS PRESCRIBED THIS ENCOUNTER: Meds ordered this encounter  Medications  . rivaroxaban (XARELTO) 20 MG TABS tablet    Sig: Take 20 mg by mouth daily with supper.  . metoprolol succinate (TOPROL-XL) 25 MG 24 hr tablet    Sig: Take 1 tablet (25 mg total) by mouth every evening.    Dispense:  30 tablet    Refill:  0    Future refills from primary care provider.    Order Specific Question:   Supervising Provider    Answer:   Patrici Ranks [[id number]]    All questions were answered. The patient knows to call the clinic with any problems, questions or concerns. We can certainly see the patient much sooner if necessary.  This note is electronically signed by: Doy Mince 05/15/2016 5:14 PM

## 2016-05-07 NOTE — Assessment & Plan Note (Addendum)
Stage IV (T3N2M1B) adenocarcinoma of right upper lobe, mediastinal lymphadenopathy, descending mesocolon (synchronous colon cancer), subcutaneous (left upper buttock, right subscapularis muscle) and left adrenal metastases.  Stage IV disease is NOT biopsy proven.  PDL1- high expressor 50%.  Oncology history developed.  Staging in [facility] problem list completed.  Labs on 05/02/2016 reviewed: CBC diff, CMET.  I personally reviewed and went over laboratory results with the patient.  The results are noted within this dictation.  Leukocytosis and thrombocytosis are noted.  Will refer patient to Gen Surgeon for port placement in anticipation of future chemotherapy consisting Carboplatin AUC 5/Alimta/Avastin every 21 days.  I reviewed the risks, benefits, alternatives, and side effects of systemic chemotherapy including, but not limited to, alopecia, myelosuppression, nausea, vomiting, peripheral neuropathy, liver and renal dysfunction.  I have discussed case with Dr. Arnoldo Morale (Gen Surg) and he has agreed to see the patient beginning of next week for consultation and subsequent port placement.  He was previously referred to Dr. Sondra Come for XRT in [place], but the patient has declined this intervention at this time.  Will refer patient to Nurse Navigator, Anderson Malta, for chemotherapy teaching.  I have refilled his metoprolol.  Return for follow-up either on day 1 of cycle 1 or for NADIR check 7-10 days following cycle 1 of treatment with labs.

## 2016-05-08 ENCOUNTER — Encounter (HOSPITAL_COMMUNITY): Payer: Self-pay | Admitting: Lab

## 2016-05-08 ENCOUNTER — Other Ambulatory Visit (HOSPITAL_COMMUNITY): Payer: Self-pay | Admitting: Emergency Medicine

## 2016-05-08 MED ORDER — ONDANSETRON HCL 8 MG PO TABS: 8.0000 mg | ORAL_TABLET | Freq: Three times a day (TID) | ORAL | 2 refills | Status: DC | PRN

## 2016-05-08 MED ORDER — PROCHLORPERAZINE MALEATE 10 MG PO TABS: 10.0000 mg | ORAL_TABLET | Freq: Four times a day (QID) | ORAL | 2 refills | Status: DC | PRN

## 2016-05-08 MED ORDER — LIDOCAINE-PRILOCAINE 2.5-2.5 % EX CREA: TOPICAL_CREAM | CUTANEOUS | 2 refills | Status: DC

## 2016-05-08 NOTE — Progress Notes (Unsigned)
Referral sent to [facility] for port appt 1/4. Port to be placed 1/8.  Records faxed on 1/2

## 2016-05-09 DIAGNOSIS — C3491 Malignant neoplasm of unspecified part of right bronchus or lung: Secondary | ICD-10-CM | POA: Diagnosis not present

## 2016-05-09 NOTE — H&P (Signed)
  NTS SOAP Note  Vital Signs:  Vitals as of: 05/11/1094: Systolic 045: Diastolic 87: Heart Rate 409: Temp 98.44F (Temporal): Height 78f 8.5in: Weight 221Lbs 0 Ounces: BMI 33.11   BMI : 33.11 kg/m2  Subjective: This 71year old male presents for of need for portacath insertion.  Referred by Oncology.  Has small cell carcinoma of right lung.  Is on xeralto for PE.  Review of Symptoms:  Head:nenegativegative Eyes:negative Nose/Mouth/Throat:negative Cardiovascular:negative Respiratory:dyspnea Gastrointestinnegative Musculoskeletal:negative Skin:negative Hematolgic/Lymphatic:negative Allergic/Immunologic:negative   Past Medical History:Reviewed  Past Medical History  Surgical History: lung biopsy Medical Problems: high cholesterol, HTN, PE, NIDDM, small cell lung carcinoma Allergies: nkda Medications: xanax, norvasc, glipizide, glucophage, xarelto, zofran, zocor   Social History:Reviewed  Social History  Preferred Language: English Race:  White Ethnicity: Not Hispanic / Latino Age: [age]year Marital Status:  D Alcohol: quit 10 years ago   Smoking Status: Former smoker reviewed on 05/09/2016 Started Date: 05/06/2013 Stopped Date:  Functional Status reviewed on 05/09/2016 ------------------------------------------------ Bathing: Normal Cooking: Normal Dressing: Normal Driving: Normal Eating: Normal Managing Meds: Normal Oral Care: Normal Shopping: Normal Toileting: Normal Transferring: Normal Walking: Normal Cognitive Status reviewed on 05/09/2016 ------------------------------------------------ Attention: Normal Decision Making: Normal Language: Normal Memory: Normal Motor: Normal Perception: Normal Problem Solving: Normal Visual and Spatial: Normal   Family History:Reviewed  Family Health History Family History is Unknown    Objective Information: General:Well appearing, well nourished in no distress. Skin:no rash or prominent  lesions Head:Atraumatic; no masses; no abnormalities Neck:Supple without lymphadenopathy.  Heart:RRR, no murmur or gallop.  Normal S1, S2.  No S3, S4.  Slight bilateral expiratory  wheezing noted.  No rales, rhonchi. Dr. PDonald Porenotes reviewed. Assessment:Small cell lung carcinoma, right  Diagnoses: 162.9  C34.91 Small cell carcinoma of lung (Malignant neoplasm of unspecified part of right bronchus or lung)  Procedures: [id number]- OFFICE OUTPATIENT NEW 20 MINUTES    Plan:Scheduled for portacath insertion on 05/14/15.  To stoop xeralto thwo days prior to procedure.   Patient Education:Alternative treatments to surgery were discussed with patient (and family).Risks and benefits  of procedure including bleeding, infection, and pneumothorax were fully explained to the patient (and family) who gave informed consent. Patient/family questions were addressed.  Follow-up:Pending Surgery

## 2016-05-09 NOTE — Patient Instructions (Addendum)
[place]   CHEMOTHERAPY INSTRUCTIONS  You have been diagnosed with Stage 4 Non-Small Cell Carcinoma and a right chest low grade leiomyosarcoma.  We are going to treat you with carboplatin, avastin, and alimta every 3 weeks.  One cycle is 3 weeks.  We will hold your avastin your first cycle because of you just getting your port placed.  Avastin will start with the 2nd cycle of chemotherapy. This is with palliative intent, which means that you are treatable but not curable.  You will see the doctor regularly throughout treatment.  We monitor your lab work prior to every treatment.  The doctor monitors your response to treatment by the way you are feeling, your blood work, and scans periodically.  You will receive the following pre-medications prior to receiving chemotherapy:  Premeds: Aloxi (IV push)- high powered nausea/vomiting prevention medication used for chemotherapy patients.  Dexamethasone - steroid - given to reduce the risk of you having an allergic type reaction to the chemotherapy. Dex can cause you to feel energized, nervous/anxious/jittery, make you have trouble sleeping, and/or make you feel hot/flushed in the face/neck and/or look pink/red in the face/neck. These side effects will pass as the Dex wears off. (takes 20 minutes to infuse)  POTENTIAL SIDE EFFECTS OF TREATMENT:  Bevacizumab (Generic Name) Other Names: Avastin  About this drug Bevacizumab is used to treat cancer. It is given in the vein (IV).  Possible Side Effects (More Common) . Slow wound healing (see Special Instructions) . Nose bleeds (see Special Instructions) . High blood pressure. Your doctor will check your blood pressure as needed. . Decrease in the number of white blood cells. This may raise your risk for infection. . Effects on your kidneys. Your doctor will test your urine as needed. . Nausea and throwing up (vomiting).These symptoms may happen within a few hours  after your treatment and may last up to 96 hours. Medicines are available to stop or lessen these side effects. . Loose bowel movements (diarrhea) that may last for a few days. . Constipation (not able to move bowels) . Abdominal pain . Passing gas or feeling bloated . Indigestion . Decrease in appetite (decreased hunger) . Changes in the way food and drinks taste . Weakness . Headache . Dizziness . Eyes tearing more often  Possible Side Effects (Less Common) . Blood clots. A blood clot in your leg may cause your leg to swell, appear red and warm, and/or\ cause pain. A blood clot in your lungs may cause trouble breathing, pain when breathing, and/or chest pain. . Sore mouth and throat . Dry skin or rash . Blurred vision or other changes in eyesight . Congestive heart failure. You may be short of breath. Your arms, hands, legs and feet may swell. . Chest pain or symptoms of a heart attack. Most heart attacks involve pain in the center of the chest that lasts more than a few minutes. The pain may go away and come back or it can be constant. It can feel like pressure, squeezing, fullness, or pain. Sometimes pain is felt in one or both arms, the back, neck, jaw, or stomach. If any of these symptoms last 2 minutes, call 911 . Symptoms of a stroke such as sudden numbness or weakness of your face, arm, or leg, especially on one side of your body; sudden confusion, trouble speaking or understanding; sudden trouble seeing in one or both eyes; sudden trouble walking, dizziness, loss of balance or coordination; or  sudden, severe headache with no known cause. If you have any of these symptoms for 2 minutes, call 911. . Serious or fatal bleeding. Seek medical attention immediately for serious bleeding.  Infusion Reactions While you are getting this drug in your vein (IV), you may have a reaction. Your nurse will check you closely for these signs: fever or shaking chills, flushing, facial swelling,  feeling dizzy, headache, trouble breathing, rash, itching, chest tightness, or chest pain. These reactions may happen the first 24 hours after you get your this drug. Call 911 for emergency care.  Allergic Reactions Serious allergic reactions including anaphylaxis are rare. While you are receiving this drug, tell your nurse right away if you have any of the following symptoms of an allergic reaction: . Trouble catching your breath . Feeling like your tongue or throat are swelling . Feeling your heart beat quickly or in a not normal way (palpitations) . Feeling dizzy or lightheaded . Flushing, itching, rash and/or hives  Treating Side Effects . Drink 6-8 cups of fluids every day unless your doctor has told you to limit your fluid intake due to some other health problem. A cup is 8 ounces of fluid. If you throw up or have loose bowel movements, you should drink more fluids so that you do not become dehydrated (lack water in the body due to losing too much fluid) . Ask your doctor or nurse about medicine to help prevent or lessen nausea, vomiting, diarrhea, constipation, indigestion, abdominal pain, bloating, or headache. . If you are not able to move your bowels, check with your doctor or nurse before you use enemas, laxatives, or suppositories . Mouth care is very important. Your mouth care should consist of routine, gentle cleaning of your teeth or dentures and rinsing your mouth with a mixture of  teaspoon of salt in 8 ounces of water or  teaspoon of baking soda in 8 ounces of water. This should be done at least after every meal and at bedtime. . If you have mouth sores, avoid mouthwash that contains alcohol. Avoid alcohol and smoking because they can bother your mouth and throat. . Do not put anything on your rash unless your doctor or nurse says you may. Keep the area around the rash clean and dry. . If you are dizzy, get up slowly after sitting or lying down.  Special Instructions .  Bevacizumab may cause slow wound healing. It should not be given within 28 days of surgery or any test or any procedure requiring conscious sedation. If you must have emergency surgery or have an accident that results in a wound, tell the treating doctor that you are on bevacizumab. Call your cancer doctor as soon as possible for further orders. . If you have a nose bleed, sit with your head tipped slightly forward. Apply pressure by lightly pinching the bridge of your nose between your thumb and pointer finger. Call your doctor if you feel dizzy or lightheaded or if the bleeding doesn't stop after 10 to 15 minutes.  Food and Drug Interactions There are no known interactions of bevacizumab with food. Bevacizumab may interact with other medicines. Tell your doctor and pharmacist about all the medicines and dietary supplements (vitamins, minerals, herbs, and others) that you are taking at this time. The safety and use of dietary supplements and alternative diets are often not known. Using these might affect your cancer or interfere with your treatment. Until more is known, you should not use dietary supplements or alternative diets  without your cancer doctor's help.  When to Call the Doctor Call your doctor or nurse right away if you have any of these symptoms: . Fever of 100.5 F (38 C) or above . Chills . Bleeding or bruising that is not usual . Wheezing or trouble breathing . Rash or itching . Feeling dizzy or lightheaded . Feeling that your heart is beating in a fast or not normal way (palpitations) . Loose bowel movements (diarrhea) more than 4 times a day or diarrhea with weakness or feeling lightheaded . Blurred vision or other changes in eyesight . Pain when passing urine; blood in urine . Pain in your lower back or side . Confusion or agitation . Nausea that stops you from eating or drinking . Throwing up more than 3 times a day . Chest pain or symptoms of a heart attack. Most heart  attacks involve pain in the center of the chest that lasts more than a few minutes. The pain may go away and come back. It can feel like pressure, squeezing, fullness, or pain. Sometimes pain is felt in one or both arms, the back, neck, jaw, or stomach. If any of these symptoms last 2 minutes, call 911. Marland Kitchen Symptoms of a stroke such as sudden numbness or weakness of your face, arm, or leg, mostly on one side of your body; sudden confusion, trouble speaking or understanding; sudden trouble seeing in one or both eyes; sudden trouble walking, feeling dizzy, loss of balance or coordination; or sudden, bad headache with no known cause. If you have any of these symptoms for 2 minutes, call 911. . Signs of liver problems: dark urine, pale bowel movements, bad stomach pain, feeling very tired or weak, unusual itching, or yellowing of the eyes or skin  Call your doctor or nurse as soon as possible if any of these symptoms happen: . Change in hearing, ringing in the ears . Decreased urine . Unusual thirst or passing urine often . Painful mouth or throat that makes it hard to eat or drink . Nausea not relieved by prescribed medicines . Rash that is not relieved by prescribed medicines . Heavy menstrual period lasting longer than normal . Numbness, tingling, decreased feeling or weakness in fingers, toes, arms, or legs . Trouble walking or changes in the way you walk, clumsiness in buttoning clothes, opening jars, or other routine hand motions . Swelling of legs, ankles, or feet . Weight gain of 5 pounds in one week (fluid retention) . Lasting loss of appetite or rapid weight loss of five pounds in a week . Fatigue that interferes with your daily activities . Headache that does not go away . Painful, red, or swollen areas on your hands or feet. . No bowel movement for 3 days or you feel uncomfortable . Extreme weakness that interferes with normal activities  Reproduction Concerns . Pregnancy warning:  This drug may have harmful effects on the unborn child, so effective methods of birth control should be used by both men and women during your cancer treatment and for at least 6 months after your treatment is done. . Genetic counseling is available for you to talk about the effects of this drug therapy on future pregnancies. Also, a genetic counselor can look at the possible risk of problems in the unborn baby due to this medicine if an exposure happens during pregnancy. . Breast feeding warning: It is not known if this drug passes into breast milk. For this reason, women should talk to their doctor  about the risks and benefits of breast feeding during treatment with this drug because this drug may enter the breast milk and badly harm a breast feeding baby.    Carboplatin (Generic Name) Other Names: Paraplatin, CBDCA  About This Drug Carboplatin is a drug used to treat cancer. This drug is given in the vein (IV).  Takes 30 minutes to infuse.   Possible Side Effects (More Common) . Nausea and throwing up (vomiting). These symptoms may happen within a few hours after your treatment and may last up to 24 hours. Medicines are available to stop or lessen these side effects. . Bone marrow depression. This is a decrease in the number of white blood cells, red blood cells, and platelets. This may raise your risk of infection, make you tired and weak (fatigue), and raise your risk of bleeding. . Soreness of the mouth and throat. You may have red areas, white patches, or sores that hurt. . This drug may affect how your kidneys work. Your kidney function will be checked as needed. . Electrolyte changes. Your blood will be checked for electrolyte changes as needed.  Possible Side Effects (Less Common) . Hair loss. Some patients lose their hair on the scalp and body. You may notice your hair thinning seven to 14 days after getting this drug. . Effects on the nerves are called peripheral neuropathy. You  may feel numbness, tingling, or pain in your hands and feet. It may be hard for you to button your clothes, open jars, or walk as usual. The effect on the nerves may get worse with more doses of the drug. These effects get better in some people after the drug is stopped but it does not get better in all people. . Loose bowel movements (diarrhea) that may last for several days . Decreased hearing or ringing in the ears . Changes in the way food and drinks taste . Changes in liver function. Your liver function will be checked as needed.  Allergic Reactions Serious allergic reactions including anaphylaxis are rare. While you are getting this drug in your vein (IV), tell your nurse right away if you have any of these symptoms of an allergic reaction: . Trouble catching your breath . Feeling like your tongue or throat are swelling . Feeling your heart beat quickly or in a not normal way (palpitations) . Feeling dizzy or lightheaded . Flushing, itching, rash, and/or hives  Treating Side Effects . Drink 6-8 cups of fluids each day unless your doctor has told you to limit your fluid intake due to some other health problem. A cup is 8 ounces of fluid. If you throw up or have loose bowel movements, you should drink more fluids so that you do not become dehydrated (lack water in the body from losing too much fluid). . Mouth care is very important. Your mouth care should consist of routine, gentle cleaning of your teeth or dentures and rinsing your mouth with a mixture of 1/2 teaspoon of salt in 8 ounces of water or  teaspoon of baking soda in 8 ounces of water. This should be done at least after each meal and at bedtime. . If you have mouth sores, avoid mouthwash that has alcohol. Avoid alcohol and smoking because they can bother your mouth and throat. . If you have numbness and tingling in your hands and feet, be careful when cooking, walking, and handling sharp objects and hot liquids. . Talk with your  nurse about getting a wig before you lose  your hair. Also, call the [employer] at 800-ACS-2345 to find out information about the "Look Good, Feel Better" program close to where you live. It is a free program where women getting chemotherapy can learn about wigs, turbans and scarves as well as makeup techniques and skin and nail care.  Food and Drug Interactions There are no known interactions of carboplatin with food. This drug may interact with other medicines. Tell your doctor and pharmacist about all the medicines and dietary supplements (vitamins, minerals, herbs and others) that you are taking at this time. The safety and use of dietary supplements and alternative diets are often not known. Using these might affect your cancer or interfere with your treatment. Until more is known, you should not use dietary supplements or alternative diets without your cancer doctor's help.  When to Call the Doctor Call your doctor or nurse right away if you have any of these symptoms: . Fever of 100.5 F (38 C) or above; chills . Bleeding or bruising that is not normal . Wheezing or trouble breathing . Nausea that stops you from eating or drinking . Throwing up more than once a day . Rash or itching . Loose bowel movements (diarrhea) more than four times a day or diarrhea with weakness or feeling lightheaded . Call your doctor or nurse as soon as possible if any of these symptoms happen: . Numbness, tingling, decreased feeling or weakness in fingers, toes, arms, or legs . Change in hearing, ringing in the ears . Blurred vision or other changes in eyesight . Decreased urine . Yellowing of skin or eyes  Sexual Problems and Reproductive Concerns Sexual problems and reproduction concerns may happen. In both men and women, this drug may affect your ability to have children. This cannot be determined before your treatment. Talk with your doctor or nurse if you plan to have children. Ask for  information on sperm or egg banking. In men, this drug may interfere with your ability to make sperm, but it should not change your ability to have sexual relations. In women, menstrual bleeding may become irregular or stop while you are getting this drug. Do not assume that you cannot become pregnant if you do not have a menstrual period. Women may go through signs of menopause (change of life) like vaginal dryness or itching. Vaginal lubricants can be used to lessen vaginal dryness, itching, and pain during sexual relations. Genetic counseling is available for you to talk about the effects of this drug therapy on future pregnancies. Also, a genetic counselor can look at the possible risk of problems in the unborn baby due to this medicine if an exposure happens during pregnancy. . Pregnancy warning: This drug may have harmful effects on the unborn child, so effective methods of birth control should be used during your cancer treatment. . Breast feeding warning: It is not known if this drug passes into breast milk. For this reason, women should talk to their doctor about the risks and benefits of breast feeding during treatment with this drug because this drug may enter the breast milk and badly harm a breast feeding baby.   Pemetrexed (Generic Name) Other Names: ALIMTA  About This Drug Pemetrexed is used to treat cancer. It is given in the vein (IV).  Takes 10 minutes to infuse.   Possible Side Effects (More Common) . Bone marrow depression. This is a decrease in the number of white blood cells, red blood cells, and platelets. This may raise your risk  of infection, make you tired and weak (fatigue), and raise your risk of bleeding. . Fatigue . Soreness of the mouth and throat. You may have red areas, white patches, or sores that hurt.  Possible Side Effects (less common) . Trouble breathing or feeling short of breath . Nausea and throwing up (vomiting) . Skin rash  Treating Side Effects .  Ask your doctor or nurse about medicine that is available to help stop or lessen nausea and throwing up. . If you get a rash, do not put anything on it unless your doctor or nurse says you may. Keep the area around the rash clean and dry. . Mouth care is very important. Your mouth care should consist of routine, gentle cleaning of your teeth or dentures and rinsing your mouth with a mixture of 1/2 teaspoon of salt in 8 ounces of water or  teaspoon of baking soda in 8 ounces of water. This should be done at least after each meal and at bedtime. . If you have mouth sores, avoid mouthwash that has alcohol. Avoid alcohol and smoking because they can bother your mouth and throat.  Food and Drug Interactions There are no known interactions of this medicine with food. Tell your doctor if you are taking ibuprofen. Pemetrexed may interact with other medicines. Tell your doctor and pharmacist about all the medicines and dietary supplements (vitamins, minerals, herbs and others) that you are taking at this time. The safety and use of dietary supplements and alternative diets are often not known. Using these might affect your cancer or interfere with your treatment. Until more is known, you should not use dietary supplements or alternative diets without your cancer doctor's help.  When to Call the Doctor Call your doctor or nurse right away if you have any of these symptoms: . Temperature of 100.5 F (38 C) or above . Chills . Easy bruising or bleeding . Trouble breathing . Chest pain . Nausea that stops you from eating or drinking . Throwing up more than 3 times in a day Call your doctor or nurse as soon as possible if you have any of these symptoms: . Rash that does not go away with prescribed medicine . Nausea or vomiting that does not go away with prescribed medicine . Extreme fatigue that interferes with normal activities  Reproduction Concerns . Pregnancy warning: This drug may have harmful  effects on the unborn child, so effective methods of birth control should be used during your cancer treatment. . Genetic counseling is available for you to talk about the effects of this drug therapy on future pregnancies. Also, a genetic counselor can look at the possible risk of problems in the unborn baby due to this medicine if an exposure happens during pregnancy. . Breast feeding warning: It is not known if this drug passes into breast milk. For this reason, women should talk to their doctor about the risks and benefits of breast feeding during treatment with this drug because this drug may enter the breast milk and badly harm a breast feeding baby.   EDUCATIONAL MATERIALS GIVEN AND REVIEWED: Chemotherapy and you book given, nutrition book given, information on carboplatin, alimta, and avastin   SELF CARE ACTIVITIES WHILE ON CHEMOTHERAPY: Hydration Increase your fluid intake 48 hours prior to treatment and drink at least 8 to 12 cups (64 ounces) of water/decaff beverages per day after treatment. You can still have your cup of coffee or soda but these beverages do not count as part of your  8 to 12 cups that you need to drink daily. No alcohol intake.  Medications Continue taking your normal prescription medication as prescribed.  If you start any new herbal or new supplements please let us know first to make sure it is safe.  Mouth Care Have teeth cleaned professionally before starting treatment. Keep dentures and partial plates clean. Use soft toothbrush and do not use mouthwashes that contain alcohol. Biotene is a good mouthwash that is available at most pharmacies or may be ordered by calling [phone]. Use warm salt water gargles (1 teaspoon salt per 1 quart warm water) before and after meals and at bedtime. Or you may rinse with 2 tablespoons of three-percent hydrogen peroxide mixed in eight ounces of water. If you are still having problems with your mouth or sores in your mouth  please call the clinic. If you need dental work, please let Dr. Whitney Muse know before you go for your appointment so that we can coordinate the best possible time for you in regards to your chemo regimen. You need to also let your dentist know that you are actively taking chemo. We may need to do labs prior to your dental appointment.   Skin Care Always use sunscreen that has not expired and with SPF (Sun Protection Factor) of 50 or higher. Wear hats to protect your head from the sun. Remember to use sunscreen on your hands, ears, face, & feet.  Use good moisturizing lotions such as udder cream, eucerin, or even Vaseline. Some chemotherapies can cause dry skin, color changes in your skin and nails.    . Avoid long, hot showers or baths. . Use gentle, fragrance-free soaps and laundry detergent. . Use moisturizers, preferably creams or ointments rather than lotions because the thicker consistency is better at preventing skin dehydration. Apply the cream or ointment within 15 minutes of showering. Reapply moisturizer at night, and moisturize your hands every time after you wash them.  Hair Loss (if your doctor says your hair will fall out)  . If your doctor says that your hair is likely to fall out, decide before you begin chemo whether you want to wear a wig. You may want to shop before treatment to match your hair color. . Hats, turbans, and scarves can also camouflage hair loss, although some people prefer to leave their heads uncovered. If you go bare-headed outdoors, be sure to use sunscreen on your scalp. . Cut your hair short. It eases the inconvenience of shedding lots of hair, but it also can reduce the emotional impact of watching your hair fall out. . Don't perm or color your hair during chemotherapy. Those chemical treatments are already damaging to hair and can enhance hair loss. Once your chemo treatments are done and your hair has grown back, it's OK to resume dyeing or perming hair. With  chemotherapy, hair loss is almost always temporary. But when it grows back, it may be a different color or texture. In older adults who still had hair color before chemotherapy, the new growth may be completely gray.  Often, new hair is very fine and soft.  Infection Prevention Please wash your hands for at least 30 seconds using warm soapy water. Handwashing is the #1 way to prevent the spread of germs. Stay away from sick people or people who are getting over a cold. If you develop respiratory systems such as green/yellow mucus production or productive cough or persistent cough let us know and we will see if you need an  antibiotic. It is a good idea to keep a pair of gloves on when going into grocery stores/[employer] to decrease your risk of coming into contact with germs on the carts, etc. Carry alcohol hand gel with you at all times and use it frequently if out in public. If your temperature reaches 100.5 or higher please call the clinic and let us know.  If it is after hours or on the weekend please go to the ER if your temperature is over 100.5.  Please have your own personal thermometer at home to use.    Sex and bodily fluids If you are going to have sex, a condom must be used to protect the person that isn't taking chemotherapy. Chemo can decrease your libido (sex drive). For a few days after chemotherapy, chemotherapy can be excreted through your bodily fluids.  When using the toilet please close the lid and flush the toilet twice.  Do this for a few day after you have had chemotherapy.     Effects of chemotherapy on your sex life Some changes are simple and won't last long. They won't affect your sex life permanently. Sometimes you may feel: . too tired . not strong enough to be very active . sick or sore  . not in the mood . anxious or low Your anxiety might not seem related to sex. For example, you may be worried about the cancer and how your treatment is going. Or you may be worried  about money, or about how you family are coping with your illness. These things can cause stress, which can affect your interest in sex. It's important to talk to your partner about how you feel. Remember - the changes to your sex life don't usually last long. There's usually no medical reason to stop having sex during chemo. The drugs won't have any long term physical effects on your performance or enjoyment of sex. Cancer can't be passed on to your partner during sex  Contraception It's important to use reliable contraception during treatment. Avoid getting pregnant while you or your partner are having chemotherapy. This is because the drugs may harm the baby. Sometimes chemotherapy drugs can leave a man or woman infertile.  This means you would not be able to have children in the future. You might want to talk to someone about permanent infertility. It can be very difficult to learn that you may no longer be able to have children. Some people find counselling helpful. There might be ways to preserve your fertility, although this is easier for men than for women. You may want to speak to a fertility expert. You can talk about sperm banking or harvesting your eggs. You can also ask about other fertility options, such as donor eggs. If you have or have had breast cancer, your doctor might advise you not to take the contraceptive pill. This is because the hormones in it might affect the cancer.  It is not known for sure whether or not chemotherapy drugs can be passed on through semen or secretions from the vagina. Because of this some doctors advise people to use a barrier method if you have sex during treatment. This applies to vaginal, anal or oral sex. Generally, doctors advise a barrier method only for the time you are actually having the treatment and for about a week after your treatment. Advice like this can be worrying, but this does not mean that you have to avoid being intimate with your partner.  You can still  have close contact with your partner and continue to enjoy sex.  Animals If you have cats or birds we just ask that you not change the litter or change the cage.  Please have someone else do this for you while you are on chemotherapy.   Food Safety During and After Cancer Treatment Food safety is important for people both during and after cancer treatment. Cancer and cancer treatments, such as chemotherapy, radiation therapy, and stem cell/bone marrow transplantation, often weaken the immune system. This makes it harder for your body to protect itself from foodborne illness, also called food poisoning. Foodborne illness is caused by eating food that contains harmful bacteria, parasites, or viruses.  Foods to avoid Some foods have a higher risk of becoming tainted with bacteria. These include: Marland Kitchen Unwashed fresh fruit and vegetables, especially leafy vegetables that can hide dirt and other contaminants . Raw sprouts, such as alfalfa sprouts . Raw or undercooked beef, especially ground beef, or other raw or undercooked meat and poultry . Fatty, fried, or spicy foods immediately before or after treatment.  These can sit heavy on your stomach and make you feel nauseous. . Raw or undercooked shellfish, such as oysters. . Sushi and sashimi, which often contain raw fish.  . Unpasteurized beverages, such as unpasteurized fruit juices, raw milk, raw yogurt, or cider . Undercooked eggs, such as soft boiled, over easy, and poached; raw, unpasteurized eggs; or foods made with raw egg, such as homemade raw cookie dough and homemade mayonnaise Simple steps for food safety Shop smart. . Do not buy food stored or displayed in an unclean area. . Do not buy bruised or damaged fruits or vegetables. . Do not buy cans that have cracks, dents, or bulges. . Pick up foods that can spoil at the end of your shopping trip and store them in a cooler on the way home. Prepare and clean up foods  carefully. . Rinse all fresh fruits and vegetables under running water, and dry them with a clean towel or paper towel. . Clean the top of cans before opening them. . After preparing food, wash your hands for 20 seconds with hot water and soap. Pay special attention to areas between fingers and under nails. . Clean your utensils and dishes with hot water and soap. Marland Kitchen Disinfect your kitchen and cutting boards using 1 teaspoon of liquid, unscented bleach mixed into 1 quart of water.   Dispose of old food. . Eat canned and packaged food before its expiration date (the "use by" or "best before" date). . Consume refrigerated leftovers within 3 to 4 days. After that time, throw out the food. Even if the food does not smell or look spoiled, it still may be unsafe. Some bacteria, such as Listeria, can grow even on foods stored in the refrigerator if they are kept for too long. Take precautions when eating out. . At restaurants, avoid buffets and salad bars where food sits out for a long time and comes in contact with many people. Food can become contaminated when someone with a virus, often a norovirus, or another "bug" handles it. . Put any leftover food in a "to-go" container yourself, rather than having the server do it. And, refrigerate leftovers as soon as you get home. . Choose restaurants that are clean and that are willing to prepare your food as you order it cooked.    MEDICATIONS: Zofran/Ondansetron '8mg'$  tablet. Take 1 tablet every 8 hours as needed for nausea/vomiting. (#1 nausea med to  take, this can constipate)  Compazine/Prochlorperazine '10mg'$  tablet. Take 1 tablet every 6 hours as needed for nausea/vomiting. (#2 nausea med to take, this can make you sleepy)   EMLA cream. Apply a quarter size amount to port site 1 hour prior to chemo. Do not rub in. Cover with plastic wrap.   Over-the-Counter Meds:  Miralax 1 capful in 8 oz of fluid daily. May increase to two times a day if needed. This  is a stool softener. If this doesn't work proceed you can add:  Senokot S-start with 1 tablet two times a day and increase to 4 tablets two times a day if needed. (total of 8 tablets in a 24 hour period). This is a stimulant laxative.   Call us if this does not help your bowels move.   Imodium '2mg'$  capsule. Take 2 capsules after the 1st loose stool and then 1 capsule every 2 hours until you go a total of 12 hours without having a loose stool. Call the [facility] if loose stools continue. If diarrhea occurs @ bedtime, take 2 capsules @ bedtime. Then take 2 capsules every 4 hours until morning. Call [facility].    Diarrhea Sheet  If you are having loose stools/diarrhea, please purchase Imodium and begin taking as outlined:  At the first sign of poorly formed or loose stools you should begin taking Imodium(loperamide) 2 mg capsules.  Take two caplets ('4mg'$ ) followed by one caplet ('2mg'$ ) every 2 hours until you have had no diarrhea for 12 hours.  During the night take two caplets ('4mg'$ ) at bedtime and continue every 4 hours during the night until the morning.  Stop taking Imodium only after there is no sign of diarrhea for 12 hours.    Always call the [facility] if you are having loose stools/diarrhea that you can't get under control.  Loose stools/disrrhea leads to dehydration (loss of water) in your body.  We have other options of trying to get the loose stools/diarrhea to stopped but you must let us know!     Constipation Sheet *Miralax in 8 oz of fluid daily.  May increase to two times a day if needed.  This is a stool softener.  If this not enough to keep your bowel regular:  You can add:  *Senokot S, start with one tablet twice a day and can increase to 4 tablets twice a day if needed.  This is a stimulant laxative.   Sometimes when you take pain medication you need BOTH a medicine to keep your stool soft and a medicine to help your bowel push it out!  Please call if the above does  not work for you.   Do not go more than 2 days without a bowel movement.  It is very important that you do not become constipated.  It will make you feel sick to your stomach (nausea) and can cause abdominal pain and vomiting.    Nausea Sheet  Zofran/Ondansetron '8mg'$  tablet. Take 1 tablet every 8 hours as needed for nausea/vomiting. (#1 nausea med to take, this can constipate)  Compazine/Prochlorperazine '10mg'$  tablet. Take 1 tablet every 6 hours as needed for nausea/vomiting. (#2 nausea med to take, this can make you sleepy)  You can take these medications together or separately.  We would first like for you to try the Ondansetron by itself and then take the Prochloperizine if needed. But you are allowed to take both medications at the same time if your nausea is that severe.  If you are having persistent nausea (nausea that does not stop) please take these medications on a staggered schedule so that the nausea medication stays in your body.  Please call the [facility] and let us know the amount of nausea that you are experiencing.  If you begin to vomit, you need to call the [facility] and if it is the weekend and you have vomited more than one time and cant get it to stop-go to the Emergency Room.  Persistent nausea/vomiting can lead to dehydration (loss of fluid in your body) and will make you feel terrible.   Ice chips, sips of clear liquids, foods that are @ room temperature, crackers, and toast tend to be better tolerated.     SYMPTOMS TO REPORT AS SOON AS POSSIBLE AFTER TREATMENT:  FEVER GREATER THAN 100.5 F  CHILLS WITH OR WITHOUT FEVER  NAUSEA AND VOMITING THAT IS NOT CONTROLLED WITH YOUR NAUSEA MEDICATION  UNUSUAL SHORTNESS OF BREATH  UNUSUAL BRUISING OR BLEEDING  TENDERNESS IN MOUTH AND THROAT WITH OR WITHOUT PRESENCE OF ULCERS  URINARY PROBLEMS  BOWEL PROBLEMS  UNUSUAL RASH    Wear comfortable clothing and clothing appropriate for easy access to any Portacath or  PICC line. Let us know if there is anything that we can do to make your therapy better!    What to do if you need assistance after hours or on the weekends: CALL [phone].  HOLD on the line, do not hang up.  You will hear multiple messages but at the end you will be connected with a nurse triage line.  They will contact Dr Whitney Muse if necessary.  Most of the time they will be able to assist you.   Do not call the hospital operator.  Dr Whitney Muse will not answer phone calls received by them.     I have been informed and understand all of the instructions given to me and have received a copy. I have been instructed to call the clinic [phone] or my family physician as soon as possible for continued medical care, if indicated. I do not have any more questions at this time but understand that I may call the [facility] or the Patient Navigator at [phone] during office hours should I have questions or need assistance in obtaining follow-up care.

## 2016-05-10 ENCOUNTER — Encounter (HOSPITAL_COMMUNITY)
Admission: RE | Admit: 2016-05-10 | Discharge: 2016-05-10 | Disposition: A | Discharge status: Home / Self Care | Payer: Medicare Other | Source: Ambulatory Visit | Attending: General Surgery | Admitting: General Surgery

## 2016-05-10 ENCOUNTER — Encounter (HOSPITAL_COMMUNITY): Payer: Self-pay

## 2016-05-13 ENCOUNTER — Encounter (HOSPITAL_COMMUNITY): Payer: Self-pay | Admitting: *Deleted

## 2016-05-13 ENCOUNTER — Ambulatory Visit (HOSPITAL_COMMUNITY): Payer: Medicare Other

## 2016-05-13 ENCOUNTER — Encounter (HOSPITAL_COMMUNITY)
Admission: RE | Disposition: A | Discharge status: Home / Self Care | Payer: Self-pay | Source: Ambulatory Visit | Attending: General Surgery

## 2016-05-13 ENCOUNTER — Ambulatory Visit (HOSPITAL_COMMUNITY): Payer: Medicare Other | Admitting: Anesthesiology

## 2016-05-13 ENCOUNTER — Ambulatory Visit (HOSPITAL_COMMUNITY)
Admission: RE | Admit: 2016-05-13 | Discharge: 2016-05-13 | Disposition: A | Discharge status: Home / Self Care | Payer: Medicare Other | Source: Ambulatory Visit | Attending: General Surgery | Admitting: General Surgery

## 2016-05-13 DIAGNOSIS — Z7984 Long term (current) use of oral hypoglycemic drugs: Secondary | ICD-10-CM | POA: Diagnosis not present

## 2016-05-13 DIAGNOSIS — Z86711 Personal history of pulmonary embolism: Secondary | ICD-10-CM | POA: Insufficient documentation

## 2016-05-13 DIAGNOSIS — C3491 Malignant neoplasm of unspecified part of right bronchus or lung: Secondary | ICD-10-CM | POA: Diagnosis not present

## 2016-05-13 DIAGNOSIS — Z95828 Presence of other vascular implants and grafts: Secondary | ICD-10-CM

## 2016-05-13 DIAGNOSIS — Z87891 Personal history of nicotine dependence: Secondary | ICD-10-CM | POA: Insufficient documentation

## 2016-05-13 DIAGNOSIS — E119 Type 2 diabetes mellitus without complications: Secondary | ICD-10-CM | POA: Diagnosis not present

## 2016-05-13 DIAGNOSIS — I10 Essential (primary) hypertension: Secondary | ICD-10-CM | POA: Diagnosis not present

## 2016-05-13 DIAGNOSIS — E7800 Pure hypercholesterolemia, unspecified: Secondary | ICD-10-CM | POA: Diagnosis not present

## 2016-05-13 DIAGNOSIS — C3490 Malignant neoplasm of unspecified part of unspecified bronchus or lung: Secondary | ICD-10-CM | POA: Diagnosis not present

## 2016-05-13 HISTORY — PX: PORTACATH PLACEMENT: SHX2246

## 2016-05-13 LAB — GLUCOSE, CAPILLARY
Glucose-Capillary: 94 mg/dL (ref 65–99)
Glucose-Capillary: 102 mg/dL — ABNORMAL HIGH (ref 65–99)

## 2016-05-13 SURGERY — INSERTION, TUNNELED CENTRAL VENOUS DEVICE, WITH PORT
Anesthesia: Monitor Anesthesia Care | Site: Chest | Laterality: Left

## 2016-05-13 MED ORDER — MIDAZOLAM HCL 2 MG/2ML IJ SOLN
INTRAMUSCULAR | Status: AC
  Filled 2016-05-13: qty 2

## 2016-05-13 MED ORDER — HEPARIN SOD (PORK) LOCK FLUSH 100 UNIT/ML IV SOLN
INTRAVENOUS | Status: AC
  Filled 2016-05-13: qty 5

## 2016-05-13 MED ORDER — KETOROLAC TROMETHAMINE 30 MG/ML IJ SOLN
30.0000 mg | Freq: Once | INTRAMUSCULAR | Status: AC
  Administered 2016-05-13: 30 mg via INTRAVENOUS
  Filled 2016-05-13: qty 1

## 2016-05-13 MED ORDER — LACTATED RINGERS IV SOLN
INTRAVENOUS | Status: DC
  Administered 2016-05-13: 1000 mL via INTRAVENOUS

## 2016-05-13 MED ORDER — FENTANYL CITRATE (PF) 100 MCG/2ML IJ SOLN
25.0000 ug | INTRAMUSCULAR | Status: AC | PRN
  Administered 2016-05-13 (×2): 25 ug via INTRAVENOUS

## 2016-05-13 MED ORDER — MIDAZOLAM HCL 2 MG/2ML IJ SOLN
1.0000 mg | INTRAMUSCULAR | Status: DC | PRN
  Administered 2016-05-13 (×2): 2 mg via INTRAVENOUS

## 2016-05-13 MED ORDER — LIDOCAINE HCL (PF) 1 % IJ SOLN
INTRAMUSCULAR | Status: AC
  Filled 2016-05-13: qty 30

## 2016-05-13 MED ORDER — HEPARIN SOD (PORK) LOCK FLUSH 100 UNIT/ML IV SOLN
INTRAVENOUS | Status: DC | PRN
  Administered 2016-05-13: 500 [IU] via INTRAVENOUS

## 2016-05-13 MED ORDER — LIDOCAINE HCL (PF) 1 % IJ SOLN
INTRAMUSCULAR | Status: DC | PRN
  Administered 2016-05-13: 10 mL

## 2016-05-13 MED ORDER — MIDAZOLAM HCL 5 MG/5ML IJ SOLN
INTRAMUSCULAR | Status: DC | PRN
Start: 2016-05-13 — End: 2016-05-13
  Administered 2016-05-13: 2 mg via INTRAVENOUS

## 2016-05-13 MED ORDER — GLYCOPYRROLATE 0.2 MG/ML IJ SOLN
0.2000 mg | Freq: Once | INTRAMUSCULAR | Status: AC | PRN
  Administered 2016-05-13: 0.2 mg via INTRAVENOUS

## 2016-05-13 MED ORDER — FENTANYL CITRATE (PF) 100 MCG/2ML IJ SOLN
INTRAMUSCULAR | Status: AC
  Filled 2016-05-13: qty 2

## 2016-05-13 MED ORDER — SODIUM CHLORIDE 0.9 % IV SOLN
INTRAVENOUS | Status: DC | PRN
  Administered 2016-05-13: 500 mL

## 2016-05-13 MED ORDER — CEFAZOLIN SODIUM-DEXTROSE 2-4 GM/100ML-% IV SOLN
2.0000 g | INTRAVENOUS | Status: AC
  Administered 2016-05-13: 2 g via INTRAVENOUS

## 2016-05-13 MED ORDER — GLYCOPYRROLATE 0.2 MG/ML IJ SOLN
INTRAMUSCULAR | Status: AC
  Filled 2016-05-13: qty 1

## 2016-05-13 MED ORDER — CHLORHEXIDINE GLUCONATE CLOTH 2 % EX PADS: 6.0000 | MEDICATED_PAD | Freq: Once | CUTANEOUS | Status: DC

## 2016-05-13 MED ORDER — CEFAZOLIN SODIUM-DEXTROSE 2-4 GM/100ML-% IV SOLN
INTRAVENOUS | Status: AC
  Filled 2016-05-13: qty 100

## 2016-05-13 MED ORDER — HYDROCODONE-ACETAMINOPHEN 5-325 MG PO TABS: 1.0000 | ORAL_TABLET | Freq: Four times a day (QID) | ORAL | 0 refills | Status: DC | PRN

## 2016-05-13 MED ORDER — PROPOFOL 500 MG/50ML IV EMUL
INTRAVENOUS | Status: DC | PRN
  Administered 2016-05-13: 25 ug/kg/min via INTRAVENOUS

## 2016-05-13 SURGICAL SUPPLY — 38 items
ADH SKN CLS APL DERMABOND .7 (GAUZE/BANDAGES/DRESSINGS) ×1
APPLIER CLIP 9.375 SM OPEN (CLIP)
APR CLP SM 9.3 20 MLT OPN (CLIP)
BAG DECANTER FOR FLEXI CONT (MISCELLANEOUS) ×2 IMPLANT
BAG HAMPER (MISCELLANEOUS) ×2 IMPLANT
CATH HICKMAN DUAL 12.0 (CATHETERS) IMPLANT
CHLORAPREP W/TINT 10.5 ML (MISCELLANEOUS) ×2 IMPLANT
CLIP APPLIE 9.375 SM OPEN (CLIP) IMPLANT
CLOTH BEACON ORANGE TIMEOUT ST (SAFETY) ×2 IMPLANT
COVER LIGHT HANDLE STERIS (MISCELLANEOUS) ×4 IMPLANT
DECANTER SPIKE VIAL GLASS SM (MISCELLANEOUS) ×2 IMPLANT
DERMABOND ADVANCED (GAUZE/BANDAGES/DRESSINGS) ×1
DERMABOND ADVANCED .7 DNX12 (GAUZE/BANDAGES/DRESSINGS) ×1 IMPLANT
DRAPE C-ARM FOLDED MOBILE STRL (DRAPES) ×2 IMPLANT
ELECT REM PT RETURN 9FT ADLT (ELECTROSURGICAL) ×2
ELECTRODE REM PT RTRN 9FT ADLT (ELECTROSURGICAL) ×1 IMPLANT
GLOVE BIOGEL PI IND STRL 7.0 (GLOVE) ×1 IMPLANT
GLOVE BIOGEL PI INDICATOR 7.0 (GLOVE) ×1
GLOVE SURG SS PI 7.5 STRL IVOR (GLOVE) ×2 IMPLANT
GOWN STRL REUS W/TWL LRG LVL3 (GOWN DISPOSABLE) ×4 IMPLANT
IV NS 500ML (IV SOLUTION) ×2
IV NS 500ML BAXH (IV SOLUTION) ×1 IMPLANT
KIT PORT POWER 8FR ISP MRI (Port) ×2 IMPLANT
KIT ROOM TURNOVER APOR (KITS) ×2 IMPLANT
MANIFOLD NEPTUNE II (INSTRUMENTS) ×2 IMPLANT
NDL HYPO 25X1 1.5 SAFETY (NEEDLE) ×1 IMPLANT
NEEDLE HYPO 25X1 1.5 SAFETY (NEEDLE) ×2 IMPLANT
PACK MINOR (CUSTOM PROCEDURE TRAY) ×2 IMPLANT
PAD ARMBOARD 7.5X6 YLW CONV (MISCELLANEOUS) ×2 IMPLANT
SET BASIN LINEN APH (SET/KITS/TRAYS/PACK) ×2 IMPLANT
SET INTRODUCER 12FR PACEMAKER (SHEATH) IMPLANT
SHEATH COOK PEEL AWAY SET 8F (SHEATH) IMPLANT
SUT PROLENE 3 0 PS 2 (SUTURE) IMPLANT
SUT VIC AB 3-0 SH 27 (SUTURE) ×2
SUT VIC AB 3-0 SH 27X BRD (SUTURE) ×1 IMPLANT
SUT VIC AB 4-0 PS2 27 (SUTURE) ×2 IMPLANT
SYR 20CC LL (SYRINGE) ×2 IMPLANT
SYR CONTROL 10ML LL (SYRINGE) ×2 IMPLANT

## 2016-05-13 NOTE — Anesthesia Preprocedure Evaluation (Signed)
Anesthesia Evaluation  Patient identified by MRN, date of birth, ID band Patient awake    Reviewed: Allergy & Precautions, NPO status , Patient's Chart, lab work & pertinent test results  Airway Mallampati: III  TM Distance: >3 FB     Dental  (+) Poor Dentition, Missing   Pulmonary former smoker, PE   breath sounds clear to auscultation       Cardiovascular hypertension, Pt. on medications  Rhythm:Regular Rate:Normal     Neuro/Psych    GI/Hepatic   Endo/Other  diabetes, Type 2, Oral Hypoglycemic Agents  Renal/GU      Musculoskeletal   Abdominal   Peds  Hematology   Anesthesia Other Findings   Reproductive/Obstetrics                             Anesthesia Physical Anesthesia Plan  ASA: III  Anesthesia Plan: MAC   Post-op Pain Management:    Induction: Intravenous  Airway Management Planned: Simple Face Mask  Additional Equipment:   Intra-op Plan:   Post-operative Plan:   Informed Consent: I have reviewed the patients History and Physical, chart, labs and discussed the procedure including the risks, benefits and alternatives for the proposed anesthesia with the patient or authorized representative who has indicated his/her understanding and acceptance.     Plan Discussed with:   Anesthesia Plan Comments:         Anesthesia Quick Evaluation

## 2016-05-13 NOTE — Discharge Instructions (Signed)
Restart Xarelto tomorrow, 05/14/16. Implanted Port Insertion, Care After Refer to this sheet in the next few weeks. These instructions provide you with information on caring for yourself after your procedure. Your health care provider may also give you more specific instructions. Your treatment has been planned according to current medical practices, but problems sometimes occur. Call your health care provider if you have any problems or questions after your procedure. WHAT TO EXPECT AFTER THE PROCEDURE After your procedure, it is typical to have the following:   Discomfort at the port insertion site. Ice packs to the area will help.  Bruising on the skin over the port. This will subside in 3-4 days. HOME CARE INSTRUCTIONS  After your port is placed, you will get a manufacturer's information card. The card has information about your port. Keep this card with you at all times.   Know what kind of port you have. There are many types of ports available.   Wear a medical alert bracelet in case of an emergency. This can help alert health care workers that you have a port.   The port can stay in for as long as your health care provider believes it is necessary.   A home health care nurse may give medicines and take care of the port.   You or a family member can get special training and directions for giving medicine and taking care of the port at home.  SEEK MEDICAL CARE IF:   Your port does not flush or you are unable to get a blood return.   You have a fever or chills. SEEK IMMEDIATE MEDICAL CARE IF:  You have new fluid or pus coming from your incision.   You notice a bad smell coming from your incision site.   You have swelling, pain, or more redness at the incision or port site.   You have chest pain or shortness of breath. This information is not intended to replace advice given to you by your health care provider. Make sure you discuss any questions you have with your  health care provider. Document Released: 02/10/2013 Document Revised: 04/27/2013 Document Reviewed: 02/10/2013 Elsevier Interactive Patient Education  2017 [employer] An implanted port is a type of central line that is placed under the skin. Central lines are used to provide IV access when treatment or nutrition needs to be given through a person's veins. Implanted ports are used for long-term IV access. An implanted port may be placed because:   You need IV medicine that would be irritating to the small veins in your hands or arms.   You need long-term IV medicines, such as antibiotics.   You need IV nutrition for a long period.   You need frequent blood draws for lab tests.   You need dialysis.  Implanted ports are usually placed in the chest area, but they can also be placed in the upper arm, the abdomen, or the leg. An implanted port has two main parts:   Reservoir. The reservoir is round and will appear as a small, raised area under your skin. The reservoir is the part where a needle is inserted to give medicines or draw blood.   Catheter. The catheter is a thin, flexible tube that extends from the reservoir. The catheter is placed into a large vein. Medicine that is inserted into the reservoir goes into the catheter and then into the vein.  HOW WILL I CARE FOR MY INCISION SITE? Do not  get the incision site wet. Bathe or shower as directed by your health care provider.  HOW IS MY PORT ACCESSED? Special steps must be taken to access the port:   Before the port is accessed, a numbing cream can be placed on the skin. This helps numb the skin over the port site.   Your health care provider uses a sterile technique to access the port.  Your health care provider must put on a mask and sterile gloves.  The skin over your port is cleaned carefully with an antiseptic and allowed to dry.  The port is gently pinched between sterile gloves, and a  needle is inserted into the port.  Only "non-coring" port needles should be used to access the port. Once the port is accessed, a blood return should be checked. This helps ensure that the port is in the vein and is not clogged.   If your port needs to remain accessed for a constant infusion, a clear (transparent) bandage will be placed over the needle site. The bandage and needle will need to be changed every week, or as directed by your health care provider.   Keep the bandage covering the needle clean and dry. Do not get it wet. Follow your health care provider's instructions on how to take a shower or bath while the port is accessed.   If your port does not need to stay accessed, no bandage is needed over the port.  WHAT IS FLUSHING? Flushing helps keep the port from getting clogged. Follow your health care provider's instructions on how and when to flush the port. Ports are usually flushed with saline solution or a medicine called heparin. The need for flushing will depend on how the port is used.   If the port is used for intermittent medicines or blood draws, the port will need to be flushed:   After medicines have been given.   After blood has been drawn.   As part of routine maintenance.   If a constant infusion is running, the port may not need to be flushed.  HOW LONG WILL MY PORT STAY IMPLANTED? The port can stay in for as long as your health care provider thinks it is needed. When it is time for the port to come out, surgery will be done to remove it. The procedure is similar to the one performed when the port was put in.  WHEN SHOULD I SEEK IMMEDIATE MEDICAL CARE? When you have an implanted port, you should seek immediate medical care if:   You notice a bad smell coming from the incision site.   You have swelling, redness, or drainage at the incision site.   You have more swelling or pain at the port site or the surrounding area.   You have a fever that is  not controlled with medicine. This information is not intended to replace advice given to you by your health care provider. Make sure you discuss any questions you have with your health care provider. Document Released: 04/22/2005 Document Revised: 02/10/2013 Document Reviewed: 12/28/2012 Elsevier Interactive Patient Education  2017 [employer].

## 2016-05-13 NOTE — Op Note (Signed)
Patient:  Darrell Christensen  DOB:  10/23/1945  MRN:  [id number]   Preop Diagnosis:  Right lung carcinoma, need for central venous access  Postop Diagnosis:  Same  Procedure:  Port-A-Cath insertion  Surgeon:  Aviva Signs, M.D.  Anes:  Mac  Indications:  Patient is a 71 year old white male who presents for a Port-A-Cath insertion. He is about to undergo chemotherapy for right lung carcinoma. The risks and benefits of the procedure including bleeding, infection, and pneumothorax were fully explained to the patient, who gave informed consent.  Procedure note:  The patient was placed in Trendelenburg position after the left upper chest was prepped and draped using the usual sterile technique with DuraPrep. Surgical site confirmation was performed. 1% Xylocaine was used for local anesthesia.  An incision was made below the left clavicle. A subcutaneous pocket was then formed. A needle is advanced to the left subclavian vein using the Seldinger technique without difficulty. A guidewire was then advanced into the right atrium under fluoroscopic guidance. An introducer peel-away sheath were placed over the guidewire. The catheter was then inserted through the peel-away sheath the peel-away sheath was removed. The catheter was then attached to the port and the port placed in subcutaneous pocket. Adequate positioning was confirmed by fluoroscopy. Good backflow blood was noted on aspiration of the port. The port was flushed with heparin flush. A power port was inserted. The subcutaneous layer was reapproximated using a 3-0 Vicryl interrupted suture. The skin was closed using a 4-0 Vicryl subcuticular suture. Dermabond was then applied.  All tape and needle counts were correct at the end of the procedure. Patient was transferred to PACU in stable condition. A chest x-ray will be performed at that time.  Complications:  None  EBL:  Minimal  Specimen:  None

## 2016-05-13 NOTE — Anesthesia Procedure Notes (Signed)
Procedure Name: MAC Date/Time: 05/13/2016 9:00 AM Performed by: Andree Elk, AMY A Pre-anesthesia Checklist: Patient identified, Timeout performed, Emergency Drugs available, Suction available and Patient being monitored Oxygen Delivery Method: Simple face mask

## 2016-05-13 NOTE — Anesthesia Postprocedure Evaluation (Signed)
Anesthesia Post Note  Patient: Darrell Christensen  Procedure(s) Performed: Procedure(s) (LRB): INSERTION PORT-A-CATH (Left)  Patient location during evaluation: PACU Anesthesia Type: MAC Level of consciousness: awake and alert and oriented Pain management: pain level controlled Vital Signs Assessment: post-procedure vital signs reviewed and stable Respiratory status: spontaneous breathing Cardiovascular status: stable Postop Assessment: no signs of nausea or vomiting Anesthetic complications: no     Last Vitals:  Vitals:   05/13/16 0850 05/13/16 0855  BP: 110/76 106/75  Pulse:    Resp: (!) 24 16  Temp:      Last Pain:  Vitals:   05/13/16 0756  TempSrc: Oral  PainSc: 10-Worst pain ever                 ADAMS, AMY A

## 2016-05-13 NOTE — Transfer of Care (Signed)
Immediate Anesthesia Transfer of Care Note  Patient: Darrell Christensen  Procedure(s) Performed: Procedure(s): INSERTION PORT-A-CATH (Left)  Patient Location: PACU  Anesthesia Type:MAC  Level of Consciousness: awake, alert , oriented and patient cooperative  Airway & Oxygen Therapy: Patient Spontanous Breathing and Patient connected to nasal cannula oxygen  Post-op Assessment: Report given to RN and Post -op Vital signs reviewed and stable  Post vital signs: Reviewed and stable  Last Vitals:  Vitals:   05/13/16 0850 05/13/16 0855  BP: 110/76 106/75  Pulse:    Resp: (!) 24 16  Temp:      Last Pain:  Vitals:   05/13/16 0756  TempSrc: Oral  PainSc: 10-Worst pain ever      Patients Stated Pain Goal: 10 ([id number])  Complications: No apparent anesthesia complications

## 2016-05-13 NOTE — Interval H&P Note (Signed)
History and Physical Interval Note:  05/13/2016 8:22 AM  Darrell Christensen  has presented today for surgery, with the diagnosis of right lung cancer  The various methods of treatment have been discussed with the patient and family. After consideration of risks, benefits and other options for treatment, the patient has consented to  Procedure(s): INSERTION PORT-A-CATH (Left) as a surgical intervention .  The patient's history has been reviewed, patient examined, no change in status, stable for surgery.  I have reviewed the patient's chart and labs.  Questions were answered to the patient's satisfaction.     Aviva Signs A

## 2016-05-14 ENCOUNTER — Encounter (HOSPITAL_COMMUNITY): Payer: Self-pay | Admitting: General Surgery

## 2016-05-15 ENCOUNTER — Other Ambulatory Visit (HOSPITAL_COMMUNITY): Payer: Self-pay | Admitting: Hematology & Oncology

## 2016-05-15 ENCOUNTER — Encounter (HOSPITAL_COMMUNITY): Payer: Medicare Other

## 2016-05-15 DIAGNOSIS — C3411 Malignant neoplasm of upper lobe, right bronchus or lung: Secondary | ICD-10-CM

## 2016-05-15 NOTE — Progress Notes (Signed)
START ON PATHWAY REGIMEN - Non-Small Cell Lung  [id number]: Pembrolizumab 200 mg q21 Days Until Disease Progression, Unacceptable Toxicity, or up to 24 Months   A cycle is 21 days:     Pembrolizumab [facility])) 200 mg flat dose in 50 mL NS IV over 30 minutes every 21 days.  Inline filter required (low-protein binding) Dose Mod: None Additional Orders: Severe immune-mediated reactions can occur. See prescribing information for more details and required immediate management with steroids. Monitor thyroid, renal, liver function tests, glucose, and sodium at baseline and before each  dose of pembrolizumab. Ref: Keytruda(R) (pembrolizumab) prescribing information, 2016.  **Always confirm dose/schedule in your pharmacy ordering system**    Patient Characteristics: Stage IV Metastatic, Non Squamous, Initial Chemotherapy/Immunotherapy, PS = 0, 1, PD-L1 Expression Positive  >= 50% (TPS) Check here if patient was staged using an edition prior to AJCC Staging - 8th Edition (i.e., prior to May 06, 2016)? false AJCC T Category: T3 Current Disease Status: Distant Metastases AJCC N Category: N2 AJCC M Category: M1b AJCC 8 Stage Grouping: IVA Histology: Non Squamous Cell ROS1 Rearrangement Status: Negative T790M Mutation Status: Not Applicable - EGFR Mutation Negative/Unknown Other Mutations/Biomarkers: No Other Actionable Mutations PD-L1 Expression Status: PD-L1 Positive >= 50% (TPS) Chemotherapy/Immunotherapy LOT: Initial Chemotherapy/Immunotherapy Molecular Targeted Therapy: Not Appropriate ALK Translocation Status: Negative Would you be surprised if this patient died  in the next year? I would be surprised if this patient died in the next year EGFR Mutation Status: Negative/Wild Type BRAF [id number] Mutation Status: Negative Performance Status: PS = 0, 1  Intent of Therapy: Non-Curative / Palliative Intent, Discussed with Patient

## 2016-05-15 NOTE — Progress Notes (Addendum)
ALERT: Recent Pathways Treatment decision is outdated. Please await next Pathways decision 

## 2016-05-16 ENCOUNTER — Telehealth (HOSPITAL_COMMUNITY): Payer: Self-pay | Admitting: Emergency Medicine

## 2016-05-16 ENCOUNTER — Encounter (HOSPITAL_COMMUNITY): Payer: Self-pay | Admitting: Emergency Medicine

## 2016-05-16 MED ORDER — PREDNISONE 20 MG PO TABS: ORAL_TABLET | ORAL | 0 refills | Status: DC

## 2016-05-16 NOTE — Progress Notes (Signed)
Chemotherapy education pulled together and appt.

## 2016-05-16 NOTE — Patient Instructions (Addendum)
[place]   CHEMOTHERAPY INSTRUCTIONS  You have Stage 4 Non Small Cell Lung Cancer.  We are going treat you with Keytruda.  Beryle Flock is given every 3 weeks.  This infusion takes 30 minutes to infuse.  There are no premedications given prior to administering this medication.  Beryle Flock is given with palliative intent, which mean that you are treatable but not curable.  You will stay on Keytruda until you have progression or you can no longer tolerate the drug.   You will see the doctor regularly throughout treatment.  We monitor your lab work prior to every treatment.  The doctor monitors your response to treatment by the way you are feeling, your blood work, and scans periodically.  POTENTIAL SIDE EFFECTS OF TREATMENT:  Pembrolizumab Beryle Flock)  About This Drug Pembrolizumab is used to treat cancer. It is given in the vein (IV).  Possible Side Effects . Tiredness . Fever . Nausea . Decreased appetite (decreased hunger) . Loose bowel movements (diarrhea) . Constipation (not able to move bowels) . Trouble breathing . Rash . Itching . Muscle and bone pain . Cough Note: Each of the side effects above was reported in 20% or greater of patients treated with pembrolizumab. Not all possible side effects are included above.  Warnings and Precautions . This drug works with your immune system and can cause inflammation in any of your organs and tissues and can change how they work. This may put you at risk for developing serious medical problems which can very rarely be fatal. . Colitis (swelling (inflammation) in the colon) - symptoms are loose bowel movements (diarrhea) stomach cramping, and sometimes blood in the bowel movements . Changes in liver function. Your liver function will be checked as needed. . Changes in kidney function which can very rarely be fatal. Your kidney function will be checked as needed. . Inflammation (swelling) of the lungs which  can very rarely be fatal - you may have a dry cough or trouble breathing. . This drug may affect some of your hormone glands (especially the thyroid, adrenals, pituitary and pancreas). Your hormone levels will be checked as needed. . Blood sugar levels may change and you may develop diabetes. If you already have diabetes, changes may need to be made to your diabetes medication. . Severe allergic skin reaction which can very rarely be fatal. You may develop blisters on your skin that are filled with fluid or a severe red rash all over your body that may be painful. . Increased risk of organ rejection in patients who have received donor organs . Increased risk of complications in patients who will undergo a stem cell transplant after receiving pembrolizumab. . While you are getting this drug in your vein (IV), you may have a reaction to the drug. Your nurse will check you closely for these signs: fever or shaking chills, flushing, facial swelling, feeling dizzy, headache, trouble breathing, rash, itching, chest tightness, or chest pain. These reactions may occur after your infusion. If this happens, call 911 for emergency care.  Important Information . This drug may be present in the saliva, tears, sweat, urine, stool, vomit, semen, and vaginal secretions. Talk to your doctor and/or your nurse about the necessary precautions to take during this time.  Treating Side Effects . Ask your doctor or nurse about medicines that are available to help stop or lessen constipation, diarrhea and/or nausea. . Drink plenty of fluids (a minimum of eight glasses per day is  recommended). . If you are not able to move your bowels, check with your doctor or nurse before you use any enemas, laxatives, or suppositories . To help with nausea and vomiting, eat small, frequent meals instead of three large meals a day. Choose foods and drinks that are at room temperature. Ask your nurse or doctor about other helpful tips and  medicine that is available to help or stop lessen these symptoms. . If you get diarrhea, eat low-fiber foods that are high in protein and calories and avoid foods that can irritate your digestive tracts or lead to cramping. Ask your nurse or doctor about medicine that can lessen or stop your diarrhea. . Manage tiredness by pacing your activities for the day. Be sure to include periods of rest between energy-draining activities . Keeping your pain under control is important to your wellbeing. Please tell your doctor or nurse if you are experiencing pain. . If you have diabetes, keep good control of your blood sugar level. Tell your nurse or your doctor if your glucose levels are higher or lower than normal . If you get a rash do not put anything on it unless your doctor or nurse says you may. Keep the area around the rash clean and dry. Ask your doctor for medicine if your rash bothers you. . Infusion reactions may happen for 24 hours after your infusion. If this happens, call 911 for emergency care.  Food and Drug Interactions . There are no known interactions of pembrolizumab with food. . There are no known interactions of pembrolizumab with other medications. . Tell your doctor and pharmacist about all the medicines and dietary supplements (vitamins, minerals, herbs and others) that you are taking at this time. The safety and use of dietary supplements and alternative agents are often not known. Using these might affect your cancer or interfere with your treatment. Until more is known, you should not use dietary supplements or alternative agents without your cancer doctor's help.  When to Call the Doctor Call your doctor or nurse if you have any of the following symptoms and/or any new or unusualsymptoms: . Fever of 100.5 F (38 C) or higher . Chills . Wheezing or trouble breathing . Rash or itching . Feeling dizzy or lightheaded . Loose bowel movements (diarrhea) more than 4 times a day or  diarrhea with weakness or lightheadedness . Nausea that stops you from eating or drinking, and/or that is not relieved by prescribed medicines . Lasting loss of appetite or rapid weight loss of five pounds in a week . Fatigue that interferes with your daily activities . No bowel movement for 3 days or you feel uncomfortable . Extreme weakness that interferes with normal activities . Bad abdominal pain, especially in upper right area . Decreased urine . Unusual thirst or passing urine often . Rash that is not relieved by prescribed medicines . Flu-like symptoms: fever, headache, muscle and joint aches, and fatigue (low energy, feeling weak) . Signs of liver problems: dark urine, pale bowel movements, bad stomach pain, feeling very tired and weak, unusual itching, or yellowing of the eyes or skin . Signs of infusion reactions such as fever or shaking chills, flushing, facial swelling, feeling dizzy, headache, trouble breathing, rash, itching, chest tightness, or chest pain. . If you think you are pregnant  Reproduction Warnings . Pregnancy warning: This drug may have harmful effects on the unborn baby. Women of child bearing potential should use effective methods of birth control during your cancer treatment  and for at least 4 months after treatment. Let your doctor know right away if you think you may be pregnant . Breast feeding warning: It is not known if this drug passes into breast milk. It is recommended that women do not breastfeed during treatment and for 4 months after treatment. . Fertility warning: Human fertility studies have not been done with this drug. Talk with your doctoror nurse if you plan to have children.  EDUCATIONAL MATERIALS GIVEN AND REVIEWED: Chemotherapy and you book given, nutrition book given, information on opdivo.     SELF CARE ACTIVITIES WHILE ON CHEMOTHERAPY: Hydration Increase your fluid intake 48 hours prior to treatment and drink at least 8 to 12 cups (64  ounces) of water/decaff beverages per day after treatment. You can still have your cup of coffee or soda but these beverages do not count as part of your 8 to 12 cups that you need to drink daily. No alcohol intake.  Medications Continue taking your normal prescription medication as prescribed.  If you start any new herbal or new supplements please let us know first to make sure it is safe.  Mouth Care Have teeth cleaned professionally before starting treatment. Keep dentures and partial plates clean. Use soft toothbrush and do not use mouthwashes that contain alcohol. Biotene is a good mouthwash that is available at most pharmacies or may be ordered by calling [phone]. Use warm salt water gargles (1 teaspoon salt per 1 quart warm water) before and after meals and at bedtime. Or you may rinse with 2 tablespoons of three-percent hydrogen peroxide mixed in eight ounces of water. If you are still having problems with your mouth or sores in your mouth please call the clinic. If you need dental work, please let Dr. Whitney Muse know before you go for your appointment so that we can coordinate the best possible time for you in regards to your chemo regimen. You need to also let your dentist know that you are actively taking chemo. We may need to do labs prior to your dental appointment.   Skin Care Always use sunscreen that has not expired and with SPF (Sun Protection Factor) of 50 or higher. Wear hats to protect your head from the sun. Remember to use sunscreen on your hands, ears, face, & feet.  Use good moisturizing lotions such as udder cream, eucerin, or even Vaseline. Some chemotherapies can cause dry skin, color changes in your skin and nails.    . Avoid long, hot showers or baths. . Use gentle, fragrance-free soaps and laundry detergent. . Use moisturizers, preferably creams or ointments rather than lotions because the thicker consistency is better at preventing skin dehydration. Apply the cream  or ointment within 15 minutes of showering. Reapply moisturizer at night, and moisturize your hands every time after you wash them.  Hair Loss (if your doctor says your hair will fall out)  . If your doctor says that your hair is likely to fall out, decide before you begin chemo whether you want to wear a wig. You may want to shop before treatment to match your hair color. . Hats, turbans, and scarves can also camouflage hair loss, although some people prefer to leave their heads uncovered. If you go bare-headed outdoors, be sure to use sunscreen on your scalp. . Cut your hair short. It eases the inconvenience of shedding lots of hair, but it also can reduce the emotional impact of watching your hair fall out. . Don't perm or color your  hair during chemotherapy. Those chemical treatments are already damaging to hair and can enhance hair loss. Once your chemo treatments are done and your hair has grown back, it's OK to resume dyeing or perming hair. With chemotherapy, hair loss is almost always temporary. But when it grows back, it may be a different color or texture. In older adults who still had hair color before chemotherapy, the new growth may be completely gray.  Often, new hair is very fine and soft.  Infection Prevention Please wash your hands for at least 30 seconds using warm soapy water. Handwashing is the #1 way to prevent the spread of germs. Stay away from sick people or people who are getting over a cold. If you develop respiratory systems such as green/yellow mucus production or productive cough or persistent cough let us know and we will see if you need an antibiotic. It is a good idea to keep a pair of gloves on when going into grocery stores/[employer] to decrease your risk of coming into contact with germs on the carts, etc. Carry alcohol hand gel with you at all times and use it frequently if out in public. If your temperature reaches 100.5 or higher please call the clinic and let us know.   If it is after hours or on the weekend please go to the ER if your temperature is over 100.5.  Please have your own personal thermometer at home to use.    Sex and bodily fluids If you are going to have sex, a condom must be used to protect the person that isn't taking chemotherapy. Chemo can decrease your libido (sex drive). For a few days after chemotherapy, chemotherapy can be excreted through your bodily fluids.  When using the toilet please close the lid and flush the toilet twice.  Do this for a few day after you have had chemotherapy.     Effects of chemotherapy on your sex life Some changes are simple and won't last long. They won't affect your sex life permanently. Sometimes you may feel: . too tired . not strong enough to be very active . sick or sore  . not in the mood . anxious or low Your anxiety might not seem related to sex. For example, you may be worried about the cancer and how your treatment is going. Or you may be worried about money, or about how you family are coping with your illness. These things can cause stress, which can affect your interest in sex. It's important to talk to your partner about how you feel. Remember - the changes to your sex life don't usually last long. There's usually no medical reason to stop having sex during chemo. The drugs won't have any long term physical effects on your performance or enjoyment of sex. Cancer can't be passed on to your partner during sex  Contraception It's important to use reliable contraception during treatment. Avoid getting pregnant while you or your partner are having chemotherapy. This is because the drugs may harm the baby. Sometimes chemotherapy drugs can leave a man or woman infertile.  This means you would not be able to have children in the future. You might want to talk to someone about permanent infertility. It can be very difficult to learn that you may no longer be able to have children. Some people find  counselling helpful. There might be ways to preserve your fertility, although this is easier for men than for women. You may want to speak to a fertility  expert. You can talk about sperm banking or harvesting your eggs. You can also ask about other fertility options, such as donor eggs. If you have or have had breast cancer, your doctor might advise you not to take the contraceptive pill. This is because the hormones in it might affect the cancer.  It is not known for sure whether or not chemotherapy drugs can be passed on through semen or secretions from the vagina. Because of this some doctors advise people to use a barrier method if you have sex during treatment. This applies to vaginal, anal or oral sex. Generally, doctors advise a barrier method only for the time you are actually having the treatment and for about a week after your treatment. Advice like this can be worrying, but this does not mean that you have to avoid being intimate with your partner. You can still have close contact with your partner and continue to enjoy sex.  Animals If you have cats or birds we just ask that you not change the litter or change the cage.  Please have someone else do this for you while you are on chemotherapy.   Food Safety During and After Cancer Treatment Food safety is important for people both during and after cancer treatment. Cancer and cancer treatments, such as chemotherapy, radiation therapy, and stem cell/bone marrow transplantation, often weaken the immune system. This makes it harder for your body to protect itself from foodborne illness, also called food poisoning. Foodborne illness is caused by eating food that contains harmful bacteria, parasites, or viruses.  Foods to avoid Some foods have a higher risk of becoming tainted with bacteria. These include: Marland Kitchen Unwashed fresh fruit and vegetables, especially leafy vegetables that can hide dirt and other contaminants . Raw sprouts, such as alfalfa  sprouts . Raw or undercooked beef, especially ground beef, or other raw or undercooked meat and poultry . Fatty, fried, or spicy foods immediately before or after treatment.  These can sit heavy on your stomach and make you feel nauseous. . Raw or undercooked shellfish, such as oysters. . Sushi and sashimi, which often contain raw fish.  . Unpasteurized beverages, such as unpasteurized fruit juices, raw milk, raw yogurt, or cider . Undercooked eggs, such as soft boiled, over easy, and poached; raw, unpasteurized eggs; or foods made with raw egg, such as homemade raw cookie dough and homemade mayonnaise Simple steps for food safety Shop smart. . Do not buy food stored or displayed in an unclean area. . Do not buy bruised or damaged fruits or vegetables. . Do not buy cans that have cracks, dents, or bulges. . Pick up foods that can spoil at the end of your shopping trip and store them in a cooler on the way home. Prepare and clean up foods carefully. . Rinse all fresh fruits and vegetables under running water, and dry them with a clean towel or paper towel. . Clean the top of cans before opening them. . After preparing food, wash your hands for 20 seconds with hot water and soap. Pay special attention to areas between fingers and under nails. . Clean your utensils and dishes with hot water and soap. Marland Kitchen Disinfect your kitchen and cutting boards using 1 teaspoon of liquid, unscented bleach mixed into 1 quart of water.   Dispose of old food. . Eat canned and packaged food before its expiration date (the "use by" or "best before" date). . Consume refrigerated leftovers within 3 to 4 days. After that time, throw  out the food. Even if the food does not smell or look spoiled, it still may be unsafe. Some bacteria, such as Listeria, can grow even on foods stored in the refrigerator if they are kept for too long. Take precautions when eating out. . At restaurants, avoid buffets and salad bars where food  sits out for a long time and comes in contact with many people. Food can become contaminated when someone with a virus, often a norovirus, or another "bug" handles it. . Put any leftover food in a "to-go" container yourself, rather than having the server do it. And, refrigerate leftovers as soon as you get home. . Choose restaurants that are clean and that are willing to prepare your food as you order it cooked.   MEDICATIONS: Prednisone 100 mg:  Take at the onset of diarrhea.  Take 5 tablets (100 mg) at one time.  Then call the cancer center.   Zofran/Ondansetron '8mg'$  tablet. Take 1 tablet every 8 hours as needed for nausea/vomiting. (#1 nausea med to take, this can constipate)  Compazine/Prochlorperazine '10mg'$  tablet. Take 1 tablet every 6 hours as needed for nausea/vomiting. (#2 nausea med to take, this can make you sleepy)  EMLA cream. Apply a quarter size amount to port site 1 hour prior to chemo. Do not rub in. Cover with plastic wrap.   Over-the-Counter Meds:  Miralax 1 capful in 8 oz of fluid daily. May increase to two times a day if needed. This is a stool softener. If this doesn't work proceed you can add:  Senokot S-start with 1 tablet two times a day and increase to 4 tablets two times a day if needed. (total of 8 tablets in a 24 hour period). This is a stimulant laxative.   Call us if this does not help your bowels move.   Imodium '2mg'$  capsule. Take 2 capsules after the 1st loose stool and then 1 capsule every 2 hours until you go a total of 12 hours without having a loose stool. Call the [facility] if loose stools continue. If diarrhea occurs @ bedtime, take 2 capsules @ bedtime. Then take 2 capsules every 4 hours until morning. Call [facility].      Diarrhea Sheet  If you are having loose stools/diarrhea, please purchase Imodium and begin taking as outlined:  At the first sign of poorly formed or loose stools you should begin taking Imodium(loperamide) 2 mg capsules.   Take two caplets ('4mg'$ ) followed by one caplet ('2mg'$ ) every 2 hours until you have had no diarrhea for 12 hours.  During the night take two caplets ('4mg'$ ) at bedtime and continue every 4 hours during the night until the morning.  Stop taking Imodium only after there is no sign of diarrhea for 12 hours.    Always call the [facility] if you are having loose stools/diarrhea that you can't get under control.  Loose stools/disrrhea leads to dehydration (loss of water) in your body.  We have other options of trying to get the loose stools/diarrhea to stopped but you must let us know!      Constipation Sheet *Miralax in 8 oz of fluid daily.  May increase to two times a day if needed.  This is a stool softener.  If this not enough to keep your bowel regular:  You can add:  *Senokot S, start with one tablet twice a day and can increase to 4 tablets twice a day if needed.  This is a stimulant laxative.  Sometimes when you take pain medication you need BOTH a medicine to keep your stool soft and a medicine to help your bowel push it out!  Please call if the above does not work for you.   Do not go more than 2 days without a bowel movement.  It is very important that you do not become constipated.  It will make you feel sick to your stomach (nausea) and can cause abdominal pain and vomiting.    Nausea Sheet  Zofran/Ondansetron '8mg'$  tablet. Take 1 tablet every 8 hours as needed for nausea/vomiting. (#1 nausea med to take, this can constipate)  Compazine/Prochlorperazine '10mg'$  tablet. Take 1 tablet every 6 hours as needed for nausea/vomiting. (#2 nausea med to take, this can make you sleepy)  You can take these medications together or separately.  We would first like for you to try the Ondansetron by itself and then take the Prochloperizine if needed. But you are allowed to take both medications at the same time if your nausea is that severe.  If you are having persistent nausea (nausea that does not  stop) please take these medications on a staggered schedule so that the nausea medication stays in your body.  Please call the [facility] and let us know the amount of nausea that you are experiencing.  If you begin to vomit, you need to call the [facility] and if it is the weekend and you have vomited more than one time and cant get it to stop-go to the Emergency Room.  Persistent nausea/vomiting can lead to dehydration (loss of fluid in your body) and will make you feel terrible.   Ice chips, sips of clear liquids, foods that are @ room temperature, crackers, and toast tend to be better tolerated.   SYMPTOMS TO REPORT AS SOON AS POSSIBLE AFTER TREATMENT:  FEVER GREATER THAN 100.5 F  CHILLS WITH OR WITHOUT FEVER  NAUSEA AND VOMITING THAT IS NOT CONTROLLED WITH YOUR NAUSEA MEDICATION  UNUSUAL SHORTNESS OF BREATH  UNUSUAL BRUISING OR BLEEDING  TENDERNESS IN MOUTH AND THROAT WITH OR WITHOUT PRESENCE OF ULCERS  URINARY PROBLEMS  BOWEL PROBLEMS  UNUSUAL RASH    Wear comfortable clothing and clothing appropriate for easy access to any Portacath or PICC line. Let us know if there is anything that we can do to make your therapy better!      What to do if you need assistance after hours or on the weekends: CALL [phone].  HOLD on the line, do not hang up.  You will hear multiple messages but at the end you will be connected with a nurse triage line.  They will contact Dr Whitney Muse if necessary.  Most of the time they will be able to assist you.   Do not call the hospital operator.  Dr Whitney Muse will not answer phone calls received by them.       I have been informed and understand all of the instructions given to me and have received a copy. I have been instructed to call the clinic [phone]  or my family physician as soon as possible for continued medical care, if indicated. I do not have any more questions at this time but understand that I may call the [facility] at  [phone] during office hours should I have questions or need assistance in obtaining follow-up care.

## 2016-05-16 NOTE — Telephone Encounter (Signed)
Called to speak with daughter angela about the change in chemotherapy.  He will be getting [place] instead of carbo/alimta/avastin.  It is still every 3 weeks but only 30 minutes with no pre-medications.  I explained the side effects and what to watch for and that I had called in prednisone to the pharmacy for him.  I explained that since he was PDL-1 expresser was greater than 50% then that did make him a candidate for front line treatment with Keytruda and this would be easier tolerated.  She said that he is very Probation officer and she will talk to him in the morning about this.  Just to bring the paper in the morning and put it in his packet, not to talk to him too much about it.

## 2016-05-17 ENCOUNTER — Encounter (HOSPITAL_BASED_OUTPATIENT_CLINIC_OR_DEPARTMENT_OTHER): Payer: Medicare Other

## 2016-05-17 ENCOUNTER — Encounter (HOSPITAL_COMMUNITY): Payer: Medicare Other

## 2016-05-17 ENCOUNTER — Ambulatory Visit (HOSPITAL_COMMUNITY): Payer: Medicare Other

## 2016-05-17 ENCOUNTER — Encounter (HOSPITAL_COMMUNITY): Payer: Self-pay

## 2016-05-17 VITALS — BP 120/72 | HR 88 | Temp 98.5°F | Resp 16 | Wt 217.6 lb

## 2016-05-17 DIAGNOSIS — Z5112 Encounter for antineoplastic immunotherapy: Secondary | ICD-10-CM | POA: Diagnosis not present

## 2016-05-17 DIAGNOSIS — C7972 Secondary malignant neoplasm of left adrenal gland: Secondary | ICD-10-CM

## 2016-05-17 DIAGNOSIS — C3411 Malignant neoplasm of upper lobe, right bronchus or lung: Secondary | ICD-10-CM | POA: Diagnosis not present

## 2016-05-17 LAB — CBC WITH DIFFERENTIAL/PLATELET
Basophils Absolute: 0.1 K/uL (ref 0.0–0.1)
Basophils Relative: 1 %
Eosinophils Absolute: 1.8 K/uL — ABNORMAL HIGH (ref 0.0–0.7)
Eosinophils Relative: 10 %
HCT: 36.9 % — ABNORMAL LOW (ref 39.0–52.0)
Hemoglobin: 11.8 g/dL — ABNORMAL LOW (ref 13.0–17.0)
Lymphocytes Relative: 20 %
Lymphs Abs: 3.6 K/uL (ref 0.7–4.0)
MCH: 27.5 pg (ref 26.0–34.0)
MCHC: 32 g/dL (ref 30.0–36.0)
MCV: 86 fL (ref 78.0–100.0)
Monocytes Absolute: 1.5 K/uL — ABNORMAL HIGH (ref 0.1–1.0)
Monocytes Relative: 9 %
Neutro Abs: 10.6 K/uL — ABNORMAL HIGH (ref 1.7–7.7)
Neutrophils Relative %: 60 %
Platelets: 334 K/uL (ref 150–400)
RBC: 4.29 MIL/uL (ref 4.22–5.81)
RDW: 14.5 % (ref 11.5–15.5)
WBC: 17.5 K/uL — ABNORMAL HIGH (ref 4.0–10.5)

## 2016-05-17 LAB — COMPREHENSIVE METABOLIC PANEL
ALT: 16 U/L — ABNORMAL LOW (ref 17–63)
AST: 17 U/L (ref 15–41)
Albumin: 3.1 g/dL — ABNORMAL LOW (ref 3.5–5.0)
Alkaline Phosphatase: 49 U/L (ref 38–126)
Anion gap: 6 (ref 5–15)
BUN: 11 mg/dL (ref 6–20)
CO2: 24 mmol/L (ref 22–32)
Calcium: 8.2 mg/dL — ABNORMAL LOW (ref 8.9–10.3)
Chloride: 107 mmol/L (ref 101–111)
Creatinine, Ser: 0.91 mg/dL (ref 0.61–1.24)
GFR calc Af Amer: >60 mL/min (ref >60)
GFR calc non Af Amer: >60 mL/min (ref >60)
Glucose, Bld: 100 mg/dL — ABNORMAL HIGH (ref 65–99)
Potassium: 3.8 mmol/L (ref 3.5–5.1)
Sodium: 137 mmol/L (ref 135–145)
Total Bilirubin: 0.3 mg/dL (ref 0.3–1.2)
Total Protein: 6.9 g/dL (ref 6.5–8.1)

## 2016-05-17 LAB — TSH: TSH: 2.034 u[IU]/mL (ref 0.350–4.500)

## 2016-05-17 MED ORDER — OXYCODONE-ACETAMINOPHEN 10-325 MG PO TABS: 1.0000 | ORAL_TABLET | ORAL | 0 refills | Status: DC | PRN

## 2016-05-17 MED ORDER — SODIUM CHLORIDE 0.9% FLUSH
10.0000 mL | INTRAVENOUS | Status: DC | PRN
  Administered 2016-05-17: 10 mL
  Filled 2016-05-17: qty 10

## 2016-05-17 MED ORDER — SODIUM CHLORIDE 0.9 % IV SOLN
200.0000 mg | Freq: Once | INTRAVENOUS | Status: AC
  Administered 2016-05-17: 200 mg via INTRAVENOUS
  Filled 2016-05-17: qty 8

## 2016-05-17 MED ORDER — HEPARIN SOD (PORK) LOCK FLUSH 100 UNIT/ML IV SOLN
INTRAVENOUS | Status: AC
  Filled 2016-05-17: qty 5

## 2016-05-17 MED ORDER — HEPARIN SOD (PORK) LOCK FLUSH 100 UNIT/ML IV SOLN
500.0000 [IU] | Freq: Once | INTRAVENOUS | Status: AC | PRN
  Administered 2016-05-17: 500 [IU]

## 2016-05-17 MED ORDER — SODIUM CHLORIDE 0.9 % IV SOLN
Freq: Once | INTRAVENOUS | Status: AC
  Administered 2016-05-17: 09:00:00 via INTRAVENOUS

## 2016-05-17 NOTE — Patient Instructions (Signed)
[facility] Discharge Instructions for Patients Receiving Chemotherapy   Beginning January 23rd 2017 lab work for the Cancer Center will be done in the  Main lab at [facility] on 1st floor. If you have a lab appointment with the Cancer Center please come in thru the  Main Entrance and check in at the main information desk   Today you received the following chemotherapy agents   To help prevent nausea and vomiting after your treatment, we encourage you to take your nausea medication     If you develop nausea and vomiting, or diarrhea that is not controlled by your medication, call the clinic.  The clinic phone number is [phone]. Office hours are Monday-Friday 8:30am-5:00pm.  BELOW ARE SYMPTOMS THAT SHOULD BE REPORTED IMMEDIATELY:  *FEVER GREATER THAN 101.0 F  *CHILLS WITH OR WITHOUT FEVER  NAUSEA AND VOMITING THAT IS NOT CONTROLLED WITH YOUR NAUSEA MEDICATION  *UNUSUAL SHORTNESS OF BREATH  *UNUSUAL BRUISING OR BLEEDING  TENDERNESS IN MOUTH AND THROAT WITH OR WITHOUT PRESENCE OF ULCERS  *URINARY PROBLEMS  *BOWEL PROBLEMS  UNUSUAL RASH Items with * indicate a potential emergency and should be followed up as soon as possible. If you have an emergency after office hours please contact your primary care physician or go to the nearest emergency department.  Please call the clinic during office hours if you have any questions or concerns.   You may also contact the Patient Navigator at [phone] should you have any questions or need assistance in obtaining follow up care.      Resources For Cancer Patients and their Caregivers ? American Cancer Society: Can assist with transportation, wigs, general needs, runs Look Good Feel Better.        1-888-227-6333 ? Cancer Care: Provides financial assistance, online support groups, medication/co-pay assistance.  1-800-813-HOPE (4673) ? [facility] Assists [place] cancer  patients and their families through emotional , educational and financial support.  336-427-4357 ? [place] DSS Where to apply for food stamps, Medicaid and utility assistance. 336-342-1394 ? [facility]: Transportation to medical appointments. 336-347-2287 ? Social Security Administration: May apply for disability if have a Stage IV cancer. 336-342-7796 1-800-772-1213 ? [place] [facility], Disability and Transit Services: Assists with nutrition, care and transit needs. 336-349-2343         

## 2016-05-17 NOTE — Progress Notes (Signed)
Consent signed for [place].  Extensive teaching packet given on keytruda.

## 2016-05-17 NOTE — Progress Notes (Signed)
Nutrition Assessment   Reason for Assessment:   Patient identified on Malnutrition Screening Tool for weight loss and poor appetite  ASSESSMENT:  71 year old male with diagnosis of right lung carcinoma with metastatic disease to descending mesocolon, left upper buttock, right subscapularis muscle and left adrenal metastases.  Past medical history of DM, HTN, PE, currently vaping did smoke cigerattes, Etoh abuse  Patient seen in infusion for first dose of [place]. Daughter is with him.  Patient reports eating about 1/3 of normal intake since a few days after Thanksgiving.  Typically eats 1 meal per day (eats out mostly, hamburger, fish sandwich or meat and vegetables).  Reports that he does not really snack much during the day.  Report that he has tried ensure and has some in refrigerator but says that they are expensive.    Nutrition Focused Physical Exam: deferred at this time  Medications: glipizide, metformin  Labs: reviewed  Anthropometrics:   Height: 68 inches Weight: 218 lb UBW: 238 lb (At Thanksgiving) BMI: 32.9  Weight loss of 8% weight loss in the last 2 months   Estimated Energy Needs  Kcals: 2100 - 2400 kcals/d Protein: 99-118 gm/d Fluid: 2.4 L/d  NUTRITION DIAGNOSIS: Inadequate oral intake related to poor appetite as evidenced by eating 1 meal per day and 8% wt loss in the last 2 months.   MALNUTRITION DIAGNOSIS: Does not meet criteria at this time   INTERVENTION:   Discussed importance of nutrition focusing on sources of calories and protein during treatment.   Discussed ensure plus program with patient and daughter and patient aware he can receive only 3 complimentary cases of ensure during time at cancer center.  Will order first complimentary case of ensure plus (strawberry) today and patient will pick it up on Tuesday.  Daughter confirms.      MONITORING, EVALUATION, GOAL: Patient will consume adequate calories and protein to maintain lean muscle  mass.   NEXT VISIT: Feb 2nd during infusion  Avaline Stillson B. Zenia Resides, [place], [place] (pager)

## 2016-05-17 NOTE — Progress Notes (Signed)
Chemotherapy given today per orders. Patient tolerated it well, no issues. Follow up as scheduled. Vitals stable and discharged from clinic ambulatory.New pain prescription given to patient.

## 2016-05-20 NOTE — Progress Notes (Signed)
24 hour follow up Patient did well , no problems.

## 2016-05-24 ENCOUNTER — Encounter (HOSPITAL_COMMUNITY): Payer: Medicare Other

## 2016-05-24 ENCOUNTER — Encounter (HOSPITAL_BASED_OUTPATIENT_CLINIC_OR_DEPARTMENT_OTHER): Payer: Medicare Other | Admitting: Oncology

## 2016-05-24 ENCOUNTER — Encounter (HOSPITAL_COMMUNITY): Payer: Self-pay | Admitting: Hematology & Oncology

## 2016-05-24 VITALS — BP 133/77 | HR 105 | Temp 97.5°F | Resp 36 | Wt 213.7 lb

## 2016-05-24 DIAGNOSIS — C3411 Malignant neoplasm of upper lobe, right bronchus or lung: Secondary | ICD-10-CM

## 2016-05-24 DIAGNOSIS — C499 Malignant neoplasm of connective and soft tissue, unspecified: Secondary | ICD-10-CM

## 2016-05-24 DIAGNOSIS — C496 Malignant neoplasm of connective and soft tissue of trunk, unspecified: Secondary | ICD-10-CM

## 2016-05-24 DIAGNOSIS — R21 Rash and other nonspecific skin eruption: Secondary | ICD-10-CM

## 2016-05-24 LAB — COMPREHENSIVE METABOLIC PANEL
ALT: 13 U/L — ABNORMAL LOW (ref 17–63)
AST: 15 U/L (ref 15–41)
Albumin: 3.3 g/dL — ABNORMAL LOW (ref 3.5–5.0)
Alkaline Phosphatase: 53 U/L (ref 38–126)
Anion gap: 9 (ref 5–15)
BUN: 10 mg/dL (ref 6–20)
CO2: 25 mmol/L (ref 22–32)
Calcium: 8.7 mg/dL — ABNORMAL LOW (ref 8.9–10.3)
Chloride: 102 mmol/L (ref 101–111)
Creatinine, Ser: 1.03 mg/dL (ref 0.61–1.24)
GFR calc Af Amer: >60 mL/min (ref >60)
GFR calc non Af Amer: >60 mL/min (ref >60)
Glucose, Bld: 127 mg/dL — ABNORMAL HIGH (ref 65–99)
Potassium: 4 mmol/L (ref 3.5–5.1)
Sodium: 136 mmol/L (ref 135–145)
Total Bilirubin: 0.5 mg/dL (ref 0.3–1.2)
Total Protein: 7.4 g/dL (ref 6.5–8.1)

## 2016-05-24 LAB — CBC WITH DIFFERENTIAL/PLATELET
Basophils Absolute: 0.1 10*3/uL (ref 0.0–0.1)
Basophils Relative: 1 %
Eosinophils Absolute: 2.3 10*3/uL — ABNORMAL HIGH (ref 0.0–0.7)
Eosinophils Relative: 11 %
HCT: 42.7 % (ref 39.0–52.0)
Hemoglobin: 13.8 g/dL (ref 13.0–17.0)
Lymphocytes Relative: 20 %
Lymphs Abs: 4.3 10*3/uL — ABNORMAL HIGH (ref 0.7–4.0)
MCH: 27.7 pg (ref 26.0–34.0)
MCHC: 32.3 g/dL (ref 30.0–36.0)
MCV: 85.6 fL (ref 78.0–100.0)
Monocytes Absolute: 1.6 10*3/uL — ABNORMAL HIGH (ref 0.1–1.0)
Monocytes Relative: 7 %
Neutro Abs: 13.4 10*3/uL — ABNORMAL HIGH (ref 1.7–7.7)
Neutrophils Relative %: 61 %
Platelets: 404 10*3/uL — ABNORMAL HIGH (ref 150–400)
RBC: 4.99 MIL/uL (ref 4.22–5.81)
RDW: 14.4 % (ref 11.5–15.5)
WBC: 21.7 10*3/uL — ABNORMAL HIGH (ref 4.0–10.5)

## 2016-05-24 LAB — TSH: TSH: 1.923 u[IU]/mL (ref 0.350–4.500)

## 2016-05-24 MED ORDER — HYDROCORTISONE 0.5 % EX CREA: 1.0000 "application " | TOPICAL_CREAM | Freq: Two times a day (BID) | CUTANEOUS | 0 refills | Status: DC

## 2016-05-24 NOTE — Patient Instructions (Signed)
[place] [facility] Discharge Instructions  RECOMMENDATIONS MADE BY THE CONSULTANT AND ANY TEST RESULTS WILL BE SENT TO YOUR REFERRING PHYSICIAN.  You were seen by Dr. Whitney Muse. Hydrocortisone cream sent your pharmacy, apply to back rash. Return as scheduled for follow up and treatment.  Thank you for choosing [place] at [facility] to provide your oncology and hematology care.  To afford each patient quality time with our provider, please arrive at least 15 minutes before your scheduled appointment time.    If you have a lab appointment with the [facility] please come in thru the  Main Entrance and check in at the main information desk  You need to re-schedule your appointment should you arrive 10 or more minutes late.  We strive to give you quality time with our providers, and arriving late affects you and other patients whose appointments are after yours.  Also, if you no show three or more times for appointments you may be dismissed from the clinic at the providers discretion.     Again, thank you for choosing [facility].  Our hope is that these requests will decrease the amount of time that you wait before being seen by our physicians.       _____________________________________________________________  Should you have questions after your visit to [facility], please contact our office at [phone] between the hours of 8:30 a.m. and 4:30 p.m.  Voicemails left after 4:30 p.m. will not be returned until the following business day.  For prescription refill requests, have your pharmacy contact our office.       Resources For Cancer Patients and their Caregivers ? American Cancer Society: Can assist with transportation, wigs, general needs, runs Look Good Feel Better.        (215)489-3546 ? Cancer Care: Provides financial assistance, online support groups, medication/co-pay assistance.   1-800-813-HOPE 579-663-9783) ? [facility] Assists [place] cancer patients and their families through emotional , educational and financial support.  (608) 806-4096 ? [place] DSS Where to apply for food stamps, Medicaid and utility assistance. 703-392-5128 ? [facility]ATS: Transportation to medical appointments. 307-550-3362 ? Social Security Administration: May apply for disability if have a Stage IV cancer. (620)503-4686 770 415 3145 ? [employer], Disability and Transit Services: Assists with nutrition, care and transit needs. [place] Support Programs: '@10RELATIVEDAYS'$ @ > Cancer Support Group  2nd Tuesday of the month 1pm-2pm, Journey Room  > Creative Journey  3rd Tuesday of the month 1130am-1pm, Journey Room  > Look Good Feel Better  1st Wednesday of the month 10am-12 noon, Journey Room (Call [place] to register [phone])

## 2016-05-24 NOTE — Progress Notes (Signed)
[facility] Hematology/Oncology Progress Note  Name: Darrell Christensen      MRN: [id number]    Location: Room/bed info not found  Date: 05/24/2016 Time:11:28 AM   REFERRING PHYSICIAN:  Curt Bears, MD (Medical Oncology)  REASON FOR CONSULT:  Stage IV NSCLC, adenocarcinoma and right chest low grade leiomyosarcoma   DIAGNOSIS:  Stage IV (T3N2M1B) Adenocarcinoma of right upper lobe, mediastinal lymphadenopathy, and left adrenal metastases in addition to a right chest wall low-grade leiomyosarcoma.  HISTORY OF PRESENT ILLNESS:   [name] is a 71 y.o. male with a medical history significant for PE on Xarelto, HTN, DM who is referred to the [facility] for Stage IV [facility]) Adenocarcinoma of right upper lobe, mediastinal lymphadenopathy, and left adrenal metastases; NOT biopsy proven Stage IV disease.   PDL1- high expressor 50%. AND Right chest wall low-grade leiomyosarcoma. AND PE, right middle lobe, small central filling defect.    Primary cancer of right upper lobe of lung ([place])   05/14/2015 Procedure    Port placement by Dr. Arnoldo Morale      04/01/2016 Initial Diagnosis    Primary cancer of right upper lobe of lung ([place])     04/02/2016 Procedure    Ultrasound-guided core biopsy performed of a solid 1.5 cm soft tissue nodule in the right chest wall.      04/03/2016 Imaging    MRI brain- No acute and intracranial process or metastasis.  Moderate chronic small vessel ischemic disease.      04/04/2016 Pathology Results    Diagnosis Soft Tissue Needle Core Biopsy, Right Chest Wall SMOOTH MUSCLE NEOPLASM Microscopic Comment The differential diagnosis include low grade leiomyosarcoma. The neoplasm shows spindle cell morphology with rare mitotic figures and minimal atypia, no necrosis present. These cells stains positive for smooth muscle actin, desmin, negative for cytokeratin AE1&3, ck8/18, s100, cd117, cd 34 and cd99. This case also reviewed  by Dr. [name] and agree.      04/05/2016 Procedure    CT-guided biopsy of right upper lobe lesion, with tissue specimen sent to pathology, by IR.      04/09/2016 Pathology Results    Lung, needle/core biopsy(ies), Right Upper Lobe - NON SMALL CELL CARCINOMA.      04/17/2016 Pathology Results    PDL1 High Expression.  TPS 50%.      04/24/2016 PET scan    1. Prominently hypermetabolic large right upper lobe mass with metastatic disease to the right paratracheal and right hilar nodal chains, to the descending mesocolon, to the left upper buttock subcutaneous tissues, to the left upper lobe, to the right subscapularis muscle, and possibly to the left adrenal gland and right breast subcutaneous tissues. Appearance compatible with stage IV lung cancer. 2. There is some focal high activity in the ascending colon which is probably from peristalsis, less likely from a local colon mass. This does raise the possibility that the adjacent mesocolon tumor implant could be due to synchronous colon cancer rather than lung metastasis. 3. Other imaging findings of potential clinical significance: Coronary, aortic arch, and branch vessel atherosclerotic vascular disease. Aortoiliac atherosclerotic vascular disease. Deformity left proximal humerus and left glenohumeral joint from prior trauma. Enlarged prostate gland.      04/26/2016 Pathology Results    FoundationONE: Genomic alterations identified- KRAS I45Y, CREBBP splice site [id number], LRP1B S515*, SNK53 Z76*, BHA19 splice site [id number], SPTA1 splice site [id number], TP53 C135Y.  Additional findings- MS-Stable, TMB-intermediate (14 Muts/Mb).  No reportable  alterations- EGFR, ALK, BRAF, MET, ERBB2, RET, ROS1.      05/17/2016 -  Chemotherapy    The patient had ONDANSETRON IVPB [facility] +/- DEXAMETHASONE, , Intravenous,  Once, 0 of 1 cycle  pembrolizumab (KEYTRUDA) 200 mg in sodium chloride 0.9 % 50 mL chemo infusion, 200 mg, Intravenous,  Once, 0 of 5 cycles  for chemotherapy treatment.         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  Two adjacent large masslike areas of consolidation within the right upper lobe with differential considerations including malignancy or pneumonia. Multiple enlarged right hilar and mediastinal lymph nodes which may be reactive or metastatic in etiology.  Additional indeterminate pulmonary nodules as above. Recommend attention on follow-up.  Indeterminate 2.9 cm left adrenal nodule. This needs dedicated evaluation with pre and post contrast-enhanced CT or MRI after confirmation of pulmonary process.  Indeterminate nodule within the right breast. Recommend dedicated evaluation with mammography.  Hepatic steatosis.      04/02/2016 Procedure    Ultrasound-guided core biopsy performed of a solid 1.5 cm soft tissue nodule in the right chest wall.      04/04/2016 Pathology Results    Diagnosis Soft Tissue Needle Core Biopsy, Right Chest Wall SMOOTH MUSCLE NEOPLASM Microscopic Comment The differential diagnosis include low grade leiomyosarcoma. The neoplasm shows spindle cell morphology with rare mitotic figures and minimal atypia, no necrosis present. These cells stains positive for smooth muscle actin, desmin, negative for cytokeratin AE1&3, ck8/18, s100, cd117, cd 34 and cd99. This case also reviewed by Dr. [name] and agree.         He tolerated his first cycle of chemotherapy well without any treatment specific complaints.  He denies any nausea or vomiting.  He denies any severe fatigue.  "Don't even feel like I got anything," he reports.  He denies any of the following issues: Keytruda side effects:  Lung problems (pneumonitis):   Shortness of breath   Chest pain    New or worse cough  Intestinal problems (colitis):   Diarrhea  or more bowel movements than usual   Stools that are black, tarry, sticky, or have bloodwork mucous   Severe stomach (abdominal) pain or tenderness  Liver problems (hepatitis):   Yellowing of skin or the sclera of eyes   Nausea or vomiting   Pain on the right side of abdomen   Dark urine   Feeling less hungry than usual   Bleeding or bruising more easily than normal  Hormone gland problems (thyroid, pituitary, adrenal glands, and pancreas):   Rapid heartbeat   Weight loss or weight gain   Increased sweating   Feeling more hungry or thirsty   Urinating more than usual   Hair loss   Feeling cold   Constipation   Voice changes (voice getting deeper)   Muscle aches   Dizziness or fainting   Headaches that will not go away, or unusual headaches  Kidney problems (nephritis and kidney failure):   Change in amount of urine or color of urine   Problems and other organs:   Change in eyesight   Severe or persistent muscle or joint pains   Severe muscle weakness  Infusion (IV) reactions:   Chills or shaking   Shortness of breath or wheezing   Itching or rash   Flushing   Dizziness   Fevers   Feeling like  passing out  He reports a pruritic rash on back that pre-dates the start of chemotherapy.  He is using a back scratcher to manage this symptom.    Review of Systems  Constitutional: Negative.  Negative for chills, fever, malaise/fatigue and weight loss.  HENT: Negative.   Eyes: Negative.   Respiratory: Negative.  Negative for cough.   Cardiovascular: Negative.  Negative for chest pain.  Gastrointestinal: Negative for constipation, diarrhea, nausea and vomiting.  Genitourinary: Negative.   Musculoskeletal: Negative.   Skin: Positive for itching and rash.  Neurological: Negative.  Negative for weakness.  Endo/Heme/Allergies: Negative.   Psychiatric/Behavioral: Negative.     PAST MEDICAL HISTORY:   Past Medical History:  Diagnosis Date  . Adrenal mass, left ([place]) 04/01/2016   . Diabetes mellitus ([place]) 05/02/2016  . Diabetes mellitus without complication ([place])   . Hypertension   . Mass of breast, right 04/01/2016  . Mass of upper lobe of right lung 04/01/2016   2 adjacent, 5 cm masses, hilar adenopathy  . Primary cancer of right upper lobe of lung ([place]) 04/01/2016   2 adjacent, 5 cm masses, hilar adenopathy  . Pulmonary embolus ([place]) 03/31/2016  . 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HCC)     ALLERGIES: No Known Allergies    MEDICATIONS: I have reviewed the patient's current medications.    Current Outpatient Prescriptions on File Prior to Visit  Medication Sig Dispense Refill  . ALPRAZolam (XANAX) 1 MG tablet Take 1 mg by mouth 3 (three) times daily as needed for anxiety.    Marland Kitchen amLODipine (NORVASC) 2.5 MG tablet Take 2.5 mg by mouth daily.  4  . GLIPIZIDE XL 5 MG 24 hr tablet Take 5 mg by mouth daily.  4  . lidocaine-prilocaine (EMLA) cream Apply a quarter size amount to affected area 1 hour prior to coming to chemotherapy. Cover with plastic wrap. 30 g 2  . metFORMIN (GLUCOPHAGE) 500 MG tablet Take 500 mg by mouth 2 (two) times daily.  4  . metoprolol succinate (TOPROL-XL) 25 MG 24 hr tablet Take 1 tablet (25 mg total) by mouth every evening. 30 tablet 0  . ondansetron (ZOFRAN) 8 MG tablet Take 8 mg by mouth every 8 (eight) hours as needed for nausea or vomiting.    Marland Kitchen oxyCODONE-acetaminophen (PERCOCET) 10-325 MG tablet Take 1 tablet by mouth every 4 (four) hours as needed for pain. 60 tablet 0  . Pembrolizumab (KEYTRUDA IV) Inject into the vein.    . predniSONE (DELTASONE) 20 MG tablet Take at the onset of diarrhea.  Take 5 tablets (100 mg) at one time.  Then call the [place]. 30 tablet 0  . prochlorperazine (COMPAZINE) 10 MG tablet   1  . rivaroxaban (XARELTO) 20 MG TABS tablet Take 20 mg by mouth daily with supper.    . simvastatin (ZOCOR) 20 MG tablet Take 20 mg by mouth every evening.  5   No current facility-administered medications on file prior to visit.       PAST SURGICAL HISTORY Past Surgical History:  Procedure Laterality Date  . PORTACATH PLACEMENT Left 05/13/2016   Procedure: INSERTION PORT-A-CATH;  Surgeon: Aviva Signs, MD;  Location: [facility];  Service: General;  Laterality: Left;    FAMILY HISTORY: Mother deceased at the age of 19 from stroke. Father deceased from complications associated with DM at the age of 10. 2 biological brother deceased:  64 years old- deceased from chronic medical issues  72 year old- deceased from internal hemorrhage due to complications  associated with EtOHism He has 1 daughter 82 years old with HTN, hypercholesterolemia. 1 son deceased at the age of 29 (34) from a work accident.  SOCIAL HISTORY: Tobacco abuse, quit 2 years ago after smoking 3 ppd x 61 years.  He is now vaping.  He admits to a significant EtOH abuse, quitting 8 years ago.  He admits to daily EtOH abuse.  He denies any illicit drug abuse.  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  He is [employer] in religion, but he admits to not being strong in his faith.  He is married x 2 and divorced x 2.    Social History   Social History  . Marital status: Divorced    Spouse name: N/A  . Number of children: N/A  . Years of education: N/A   Social History Main Topics  . Smoking status: Former Smoker    Years: 61.00    Types: Cigarettes, E-cigarettes    Quit date: 04/11/2014  . Smokeless tobacco: Never Used     Comment: patient does vape smoking x 2 years  . Alcohol use No     Comment: quit 10 years  . Drug use: No  . Sexual activity: Not Asked   Other Topics Concern  . None   Social History Narrative  . None    PERFORMANCE STATUS: The patient's performance status is 1 - Symptomatic but completely ambulatory  PHYSICAL EXAM: Most Recent Vital Signs: Blood pressure 133/77, pulse (!) 105, temperature 97.5 F (36.4 C), temperature source Oral, resp. rate (!) 36, weight 213 lb 11.2 oz (96.9 kg), SpO2 95 %. Physical Exam    Constitutional: He is oriented to person, place, and time and well-developed, well-nourished, and in no distress.  HENT:  Head: Normocephalic and atraumatic.  Mouth/Throat: Oropharynx is clear and moist.  Eyes: Conjunctivae and EOM are normal. Pupils are equal, round, and reactive to light.  Neck: Normal range of motion. Neck supple.  Cardiovascular: Normal rate, regular rhythm and normal heart sounds.   Pulmonary/Chest: Effort normal and breath sounds normal.  Abdominal: Soft. Bowel sounds are normal.  Musculoskeletal: Normal range of motion.  Neurological: He is alert and oriented to person, place, and time. Gait normal.  Skin: Skin is warm and dry. Rash (on upper and middle back with multiple excoriation marks without excess erythema or pain) noted.  Nursing note and vitals reviewed.   LABORATORY DATA:  I have reviewed the data as listed. CBC    Component Value Date/Time   WBC 21.7 (H) 05/24/2016 0923   RBC 4.99 05/24/2016 0923   HGB 13.8 05/24/2016 0923   HGB 13.5 05/02/2016 0852   HCT 42.7 05/24/2016 0923   HCT 41.0 05/02/2016 0852   PLT 404 (H) 05/24/2016 0923   PLT 436 (H) 05/02/2016 0852   MCV 85.6 05/24/2016 0923   MCV 85.7 05/02/2016 0852   MCH 27.7 05/24/2016 0923   MCHC 32.3 05/24/2016 0923   RDW 14.4 05/24/2016 0923   RDW 13.9 05/02/2016 0852   LYMPHSABS 4.3 (H) 05/24/2016 0923   LYMPHSABS 3.4 (H) 05/02/2016 0852   MONOABS 1.6 (H) 05/24/2016 0923   MONOABS 1.2 (H) 05/02/2016 0852   EOSABS 2.3 (H) 05/24/2016 0923   EOSABS 1.6 (H) 05/02/2016 0852   BASOSABS 0.1 05/24/2016 0923   BASOSABS 0.2 (H) 05/02/2016 0852     Chemistry      Component Value Date/Time   NA 136 05/24/2016 0923   NA 139 05/02/2016 0852   K  4.0 05/24/2016 0923   K 4.2 05/02/2016 0852   CL 102 05/24/2016 0923   CO2 25 05/24/2016 0923   CO2 24 05/02/2016 0852   BUN 10 05/24/2016 0923   BUN 10.6 05/02/2016 0852   CREATININE 1.03 05/24/2016 0923   CREATININE 1.0 05/02/2016 0852       Component Value Date/Time   CALCIUM 8.7 (L) 05/24/2016 0923   CALCIUM 9.2 05/02/2016 0852   ALKPHOS 53 05/24/2016 0923   ALKPHOS 73 05/02/2016 0852   AST 15 05/24/2016 0923   AST 12 05/02/2016 0852   ALT 13 (L) 05/24/2016 0923   ALT 14 05/02/2016 0852   BILITOT 0.5 05/24/2016 0923   BILITOT 0.42 05/02/2016 0852       RADIOGRAPHY: I have personally reviewed the radiological images as listed and agreed with the findings in the report.  CLINICAL DATA:  Initial treatment strategy for right upper lobe lung cancer.  EXAM: NUCLEAR MEDICINE PET SKULL BASE TO THIGH  TECHNIQUE: 11.6 mCi F-18 FDG was injected intravenously. Full-ring PET imaging was performed from the skull base to thigh after the radiotracer. CT data was obtained and used for attenuation correction and anatomic localization.  FASTING BLOOD GLUCOSE:  Value: 97 mg/dl  COMPARISON:  Chest CT dated 03/31/2016  FINDINGS: NECK  No hypermetabolic lymph nodes in the neck.  CHEST  Confluent right upper lobe mass approximately 8.8 by 6.3 cm, maximum SUV 50.3. No chest wall invasion identified.  Right paratracheal lymph node 2.1 cm in short axis on image 64/4, maximum SUV 62.3. Right hilar lymph node approximately 2 cm in diameter, maximum SUV 34.6. Small right pleural effusion is faintly hypermetabolic, maximum SUV 4.3.  6 mm left upper lobe pulmonary nodule on image 28/8, blurred associated metabolic activity with maximum SUV 3.0.  Coronary, aortic arch, and branch vessel atherosclerotic vascular disease.  ABDOMEN/PELVIS  1.7 by 2.8 cm left adrenal mass, maximum SUV 5.6. Precontrast density 15 Hounsfield units. Right adrenal activity 5.0, but without definite CT correlate.  Posteriorly in the left abdomen in the adipose tissues near the descending colon there is a 2.3 by 1.9 cm tumor implant with maximum SUV 28.4. There is also some focal high activity in the adjacent colon which could be  from peristalsis but with maximum SUV 11.6.  Aortoiliac atherosclerotic vascular disease. Enlarged prostate gland without definite associated hypermetabolic activity.  SKELETON  Focal high activity in the right subscapularis muscle without a well-defined CT correlate but with maximum SUV 24.9. In the subcutaneous tissues of the right breast there is a 1.4 by 1.6 cm soft tissue density nodule with maximum SUV 3.0  Deformity of the left proximal humerus and left glenohumeral joint related to prior trauma.  Left posterior subcutaneous nodule along the upper buttock just above the level of the iliac crests, only measuring 7 mm in diameter but with maximum SUV 16.3.  IMPRESSION: 1. Prominently hypermetabolic large right upper lobe mass with metastatic disease to the right paratracheal and right hilar nodal chains, to the descending mesocolon, to the left upper buttock subcutaneous tissues, to the left upper lobe, to the right subscapularis muscle, and possibly to the left adrenal gland and right breast subcutaneous tissues. Appearance compatible with stage IV lung cancer. 2. There is some focal high activity in the ascending colon which is probably from peristalsis, less likely from a local colon mass. This does raise the possibility that the adjacent mesocolon tumor implant could be due to synchronous colon cancer rather than lung metastasis.  3. Other imaging findings of potential clinical significance: Coronary, aortic arch, and branch vessel atherosclerotic vascular disease. Aortoiliac atherosclerotic vascular disease. Deformity left proximal humerus and left glenohumeral joint from prior trauma. Enlarged prostate gland.   Electronically Signed   By: Van Clines M.D.   On: 04/24/2016 13:06  Study Result   CLINICAL DATA:  Adenocarcinoma of the right lung. Status post port placement.  EXAM: PORTABLE CHEST 1 VIEW  COMPARISON:   04/05/2016  FINDINGS: Port-A-Cath has been placed via the left subclavian vein with the catheter tip terminating in the SVC. No pneumothorax is present following placement. Lung volumes are low bilaterally. There is visible significant enlargement of a a right upper lobe lung mass since the prior chest x-ray. No edema or significant pleural fluid.  IMPRESSION: Port-A-Cath placement with tip terminating in the SVC. No pneumothorax after placement. Visible enlargement of right upper lobe lung mass.   Electronically Signed   By: Aletta Edouard M.D.   On: 05/13/2016 10:10      PATHOLOGY:    Diagnosis Soft Tissue Needle Core Biopsy, Right Chest Wall SMOOTH MUSCLE NEOPLASM Microscopic Comment The differential diagnosis include low grade leiomyosarcoma. The neoplasm shows spindle cell morphology with rare mitotic figures and minimal atypia, no necrosis present. These cells stains positive for smooth muscle actin, desmin, negative for cytokeratin AE1&3, ck8/18, s100, cd117, cd 34 and cd99. This case also reviewed by Dr. [name] and agree. Casimer Lanius MD Pathologist, Electronic Signature (Case signed 04/04/2016)     REASON FOR ADDENDUM, AMENDMENT OR CORRECTION: [phone]: Immunohistochemical results. 04/09/16 09:35:46 AM (gt) FINAL DIAGNOSIS Diagnosis Lung, needle/core biopsy(ies), Right Upper Lobe - NON SMALL CELL CARCINOMA. Microscopic Comment Immunohistochemistry will be performed and reported and reported as an addendum. (JDP:gt, 04/08/16) ADDENDUM: Immunohistochemistry shows the tumor is strongly positive with thyroid transcription factor-1 and is mostly negative with cytokeratin 5/6. The immunophenotype is consistent with poorly differentiated lung adenocarcinoma. (JDP:gt, 04/09/16) Claudette Laws MD Pathologist, Electronic Signature (Case signed 04/08/2016)   ASSESSMENT/PLAN:  Primary cancer of right upper lobe of lung (HCC) Stage IV (T3N2M1B)  adenocarcinoma of right upper lobe, mediastinal lymphadenopathy, descending mesocolon (synchronous colon cancer), subcutaneous (left upper buttock, right subscapularis muscle) and left adrenal metastases.  Stage IV disease is NOT biopsy proven.  PDL1- high expressor 50%.  Oncology history updated.  Labs today: CBC diff, CMET, TSH.  I personally reviewed and went over laboratory results with the patient.  The results are noted within this dictation.  Leukocytosis is noted.    He denies any symptoms concerning for infection.  If leukocytosis persists, chest xray and urine testing would be reasonable.  He was previously referred to Dr. Sondra Come for XRT in [place], but the patient has declined this intervention at this time.  We will re-refer if needed in the future.  For his rash that pre-dates start of therapy on back, I have escribed Hydrocortisone cream for this.  Return as scheduled for follow-up, pre-treatment labs, and cycle #2.   ORDERS PLACED FOR THIS ENCOUNTER: No orders of the defined types were placed in this encounter.   MEDICATIONS PRESCRIBED THIS ENCOUNTER: Meds ordered this encounter  Medications  . hydrocortisone cream 0.5 %    Sig: Apply 1 application topically 2 (two) times daily.    Dispense:  30 g    Refill:  0    Order Specific Question:   Supervising Provider    Answer:   Patrici Ranks [id number]    All questions were answered. The patient  knows to call the clinic with any problems, questions or concerns. We can certainly see the patient much sooner if necessary.  This document serves as a record of services personally performed by Ancil Linsey, MD. It was created on her behalf by Arlyce Harman, a trained medical scribe. The creation of this record is based on the scribe's personal observations and the provider's statements to them. This document has been checked and approved by the attending provider.  I have reviewed the above documentation for accuracy and  completeness and I agree with the above.   This note is electronically signed by: Doy Mince 05/24/2016 11:28 AM

## 2016-05-24 NOTE — Assessment & Plan Note (Signed)
Stage IV (T3N2M1B) adenocarcinoma of right upper lobe, mediastinal lymphadenopathy, descending mesocolon (synchronous colon cancer), subcutaneous (left upper buttock, right subscapularis muscle) and left adrenal metastases.  Stage IV disease is NOT biopsy proven.  PDL1- high expressor 50%.  Oncology history updated.  Labs today: CBC diff, CMET, TSH.  I personally reviewed and went over laboratory results with the patient.  The results are noted within this dictation.  Leukocytosis is noted.    He denies any symptoms concerning for infection.  If leukocytosis persists, chest xray and urine testing would be reasonable.  He was previously referred to Dr. Sondra Come for XRT in [place], but the patient has declined this intervention at this time.  We will re-refer if needed in the future.  For his rash that pre-dates start of therapy on back, I have escribed Hydrocortisone cream for this.  Return as scheduled for follow-up, pre-treatment labs, and cycle #2.

## 2016-05-27 ENCOUNTER — Other Ambulatory Visit (HOSPITAL_COMMUNITY): Payer: Medicare Other

## 2016-06-03 ENCOUNTER — Other Ambulatory Visit (HOSPITAL_COMMUNITY): Payer: Self-pay | Admitting: Oncology

## 2016-06-03 DIAGNOSIS — C3411 Malignant neoplasm of upper lobe, right bronchus or lung: Secondary | ICD-10-CM

## 2016-06-04 ENCOUNTER — Encounter (HOSPITAL_COMMUNITY): Payer: Self-pay | Admitting: Oncology

## 2016-06-07 ENCOUNTER — Encounter (HOSPITAL_COMMUNITY): Payer: Medicare Other | Attending: Oncology

## 2016-06-07 ENCOUNTER — Ambulatory Visit (HOSPITAL_COMMUNITY): Payer: Medicare Other | Admitting: Hematology & Oncology

## 2016-06-07 ENCOUNTER — Encounter (HOSPITAL_COMMUNITY): Payer: Self-pay

## 2016-06-07 ENCOUNTER — Encounter (HOSPITAL_COMMUNITY): Payer: Medicare Other

## 2016-06-07 VITALS — BP 123/80 | HR 82 | Temp 97.5°F | Resp 18 | Wt 213.0 lb

## 2016-06-07 DIAGNOSIS — Z5112 Encounter for antineoplastic immunotherapy: Secondary | ICD-10-CM | POA: Diagnosis not present

## 2016-06-07 DIAGNOSIS — C3411 Malignant neoplasm of upper lobe, right bronchus or lung: Secondary | ICD-10-CM

## 2016-06-07 LAB — COMPREHENSIVE METABOLIC PANEL
ALT: 12 U/L — ABNORMAL LOW (ref 17–63)
AST: 13 U/L — ABNORMAL LOW (ref 15–41)
Albumin: 3.2 g/dL — ABNORMAL LOW (ref 3.5–5.0)
Alkaline Phosphatase: 60 U/L (ref 38–126)
Anion gap: 7 (ref 5–15)
BUN: 13 mg/dL (ref 6–20)
CO2: 25 mmol/L (ref 22–32)
Calcium: 8.5 mg/dL — ABNORMAL LOW (ref 8.9–10.3)
Chloride: 107 mmol/L (ref 101–111)
Creatinine, Ser: 0.86 mg/dL (ref 0.61–1.24)
GFR calc Af Amer: >60 mL/min (ref >60)
GFR calc non Af Amer: >60 mL/min (ref >60)
Glucose, Bld: 78 mg/dL (ref 65–99)
Potassium: 3.8 mmol/L (ref 3.5–5.1)
Sodium: 139 mmol/L (ref 135–145)
Total Bilirubin: 0.4 mg/dL (ref 0.3–1.2)
Total Protein: 6.7 g/dL (ref 6.5–8.1)

## 2016-06-07 LAB — CBC WITH DIFFERENTIAL/PLATELET
Basophils Absolute: 0.1 10*3/uL (ref 0.0–0.1)
Basophils Relative: 1 %
Eosinophils Absolute: 1.2 10*3/uL — ABNORMAL HIGH (ref 0.0–0.7)
Eosinophils Relative: 9 %
HCT: 39.8 % (ref 39.0–52.0)
Hemoglobin: 12.9 g/dL — ABNORMAL LOW (ref 13.0–17.0)
Lymphocytes Relative: 33 %
Lymphs Abs: 4.6 10*3/uL — ABNORMAL HIGH (ref 0.7–4.0)
MCH: 27.4 pg (ref 26.0–34.0)
MCHC: 32.4 g/dL (ref 30.0–36.0)
MCV: 84.7 fL (ref 78.0–100.0)
Monocytes Absolute: 1.3 10*3/uL — ABNORMAL HIGH (ref 0.1–1.0)
Monocytes Relative: 9 %
Neutro Abs: 6.7 10*3/uL (ref 1.7–7.7)
Neutrophils Relative %: 48 %
Platelets: 353 10*3/uL (ref 150–400)
RBC: 4.7 MIL/uL (ref 4.22–5.81)
RDW: 14.9 % (ref 11.5–15.5)
WBC: 13.8 10*3/uL — ABNORMAL HIGH (ref 4.0–10.5)

## 2016-06-07 MED ORDER — HEPARIN SOD (PORK) LOCK FLUSH 100 UNIT/ML IV SOLN
INTRAVENOUS | Status: AC
Start: 2016-06-07 — End: 2016-06-07
  Filled 2016-06-07: qty 5

## 2016-06-07 MED ORDER — HEPARIN SOD (PORK) LOCK FLUSH 100 UNIT/ML IV SOLN
500.0000 [IU] | Freq: Once | INTRAVENOUS | Status: AC | PRN
  Administered 2016-06-07: 500 [IU]

## 2016-06-07 MED ORDER — SODIUM CHLORIDE 0.9% FLUSH
10.0000 mL | INTRAVENOUS | Status: DC | PRN
  Administered 2016-06-07: 10 mL
  Filled 2016-06-07: qty 10

## 2016-06-07 MED ORDER — SODIUM CHLORIDE 0.9 % IV SOLN
200.0000 mg | Freq: Once | INTRAVENOUS | Status: AC
  Administered 2016-06-07: 200 mg via INTRAVENOUS
  Filled 2016-06-07: qty 8

## 2016-06-07 MED ORDER — SODIUM CHLORIDE 0.9 % IV SOLN
Freq: Once | INTRAVENOUS | Status: AC
  Administered 2016-06-07: 11:00:00 via INTRAVENOUS

## 2016-06-07 NOTE — Progress Notes (Signed)
Nutrition Follow-up:  Nutrition follow-up completed during infusion this am.  Patient with right lung carcinoma with metastatic disease to descending mesocolon, left buttock, right subscapularis muscle and left adrenal metastases.  Patient reports appetite is still not that good.  Reports that he is drinking 1 ensure per day. If he drinks more will have diarrhea and can't hardly get to bathroom.  Normal bowel movement without drinking extra can of ensure he reports is 1 per day or 1 every other day.  Patient reports typically goes out to eat as that is his outing for the day.  Daughter reports last night for dinner had meatloaf, macaroni and cheese, and cabbage.  Tolerated well. Patient with frequent jokes during visit and hard to keep on task and understand what he is actually taking in.     Medications: reviewed  Labs: reviewed  Anthropometrics:   Weight today is 213 pounds. Weight noted on 1/19 213 lb 11.2 oz.     NUTRITION DIAGNOSIS: Inadequate oral intake continues    INTERVENTION:   Daughter looking into Meals on Wheels program. Encouraged continued intake of ensure daily. Discussed other options with patient and daughter (ie carnation instant breakfast) Discussed having frozen meals on hand when did not feel like driving to get food.  Patient does minimal cooking. Daughter reports she has offered for patient to live with her but he has declined.  Discussed other high protein, high calorie quick snacks to have on hand during the day for patient to eat.      MONITORING, EVALUATION, GOAL: Patient will consume adequate calories and protein to maintain lean muscle mass.   NEXT VISIT: Feb 23 during infusion  Lyndel Sarate B. Zenia Resides, [place], [place] (pager)

## 2016-06-07 NOTE — Patient Instructions (Signed)
[facility] Discharge Instructions for Patients Receiving Chemotherapy   Beginning January 23rd 2017 lab work for the Cancer Center will be done in the  Main lab at  on 1st floor. If you have a lab appointment with the Cancer Center please come in thru the  Main Entrance and check in at the main information desk   Today you received the following chemotherapy agents Keytruda. Follow-up as scheduled. Call clinic for any questions or concerns  To help prevent nausea and vomiting after your treatment, we encourage you to take your nausea medication   If you develop nausea and vomiting, or diarrhea that is not controlled by your medication, call the clinic.  The clinic phone number is [phone]. Office hours are Monday-Friday 8:30am-5:00pm.  BELOW ARE SYMPTOMS THAT SHOULD BE REPORTED IMMEDIATELY:  *FEVER GREATER THAN 101.0 F  *CHILLS WITH OR WITHOUT FEVER  NAUSEA AND VOMITING THAT IS NOT CONTROLLED WITH YOUR NAUSEA MEDICATION  *UNUSUAL SHORTNESS OF BREATH  *UNUSUAL BRUISING OR BLEEDING  TENDERNESS IN MOUTH AND THROAT WITH OR WITHOUT PRESENCE OF ULCERS  *URINARY PROBLEMS  *BOWEL PROBLEMS  UNUSUAL RASH Items with * indicate a potential emergency and should be followed up as soon as possible. If you have an emergency after office hours please contact your primary care physician or go to the nearest emergency department.  Please call the clinic during office hours if you have any questions or concerns.   You may also contact the Patient Navigator at [phone] should you have any questions or need assistance in obtaining follow up care.      Resources For Cancer Patients and their Caregivers ? American Cancer Society: Can assist with transportation, wigs, general needs, runs Look Good Feel Better.        1-888-227-6333 ? Cancer Care: Provides financial assistance, online support groups, medication/co-pay assistance.  1-800-813-HOPE  (4673) ? [facility] Assists [place] cancer patients and their families through emotional , educational and financial support.  336-427-4357 ? [place] DSS Where to apply for food stamps, Medicaid and utility assistance. 336-342-1394 ? [facility]: Transportation to medical appointments. 336-347-2287 ? Social Security Administration: May apply for disability if have a Stage IV cancer. 336-342-7796 1-800-772-1213 ? [place] Aging, Disability and Transit Services: Assists with nutrition, care and transit needs. 336-349-2343         

## 2016-06-07 NOTE — Progress Notes (Signed)
Darrell Christensen tolerated chemo tx well without complaints or incident.Labs reviewed prior to administering chemotherapy VSS upon discharge. Pt discharged self ambulatory in satisfactory condition accompanied by his daughter

## 2016-06-28 ENCOUNTER — Encounter (HOSPITAL_COMMUNITY): Payer: Self-pay

## 2016-06-28 ENCOUNTER — Encounter (HOSPITAL_BASED_OUTPATIENT_CLINIC_OR_DEPARTMENT_OTHER): Payer: Medicare Other | Admitting: Adult Health

## 2016-06-28 ENCOUNTER — Encounter (HOSPITAL_COMMUNITY): Payer: Self-pay | Admitting: Adult Health

## 2016-06-28 ENCOUNTER — Encounter (HOSPITAL_BASED_OUTPATIENT_CLINIC_OR_DEPARTMENT_OTHER): Payer: Medicare Other

## 2016-06-28 ENCOUNTER — Encounter (HOSPITAL_COMMUNITY): Payer: Medicare Other

## 2016-06-28 VITALS — BP 125/72 | HR 73 | Temp 97.6°F | Resp 20 | Wt 216.6 lb

## 2016-06-28 DIAGNOSIS — Z5112 Encounter for antineoplastic immunotherapy: Secondary | ICD-10-CM | POA: Diagnosis not present

## 2016-06-28 DIAGNOSIS — C493 Malignant neoplasm of connective and soft tissue of thorax: Secondary | ICD-10-CM | POA: Diagnosis not present

## 2016-06-28 DIAGNOSIS — C3411 Malignant neoplasm of upper lobe, right bronchus or lung: Secondary | ICD-10-CM

## 2016-06-28 DIAGNOSIS — I2699 Other pulmonary embolism without acute cor pulmonale: Secondary | ICD-10-CM

## 2016-06-28 DIAGNOSIS — C499 Malignant neoplasm of connective and soft tissue, unspecified: Secondary | ICD-10-CM

## 2016-06-28 LAB — CBC WITH DIFFERENTIAL/PLATELET
Basophils Absolute: 0.1 10*3/uL (ref 0.0–0.1)
Basophils Relative: 1 %
Eosinophils Absolute: 0.5 10*3/uL (ref 0.0–0.7)
Eosinophils Relative: 5 %
HCT: 41.6 % (ref 39.0–52.0)
Hemoglobin: 13.7 g/dL (ref 13.0–17.0)
Lymphocytes Relative: 38 %
Lymphs Abs: 3.8 10*3/uL (ref 0.7–4.0)
MCH: 27.8 pg (ref 26.0–34.0)
MCHC: 32.9 g/dL (ref 30.0–36.0)
MCV: 84.6 fL (ref 78.0–100.0)
Monocytes Absolute: 0.9 10*3/uL (ref 0.1–1.0)
Monocytes Relative: 9 %
Neutro Abs: 4.8 10*3/uL (ref 1.7–7.7)
Neutrophils Relative %: 47 %
Platelets: 317 10*3/uL (ref 150–400)
RBC: 4.92 MIL/uL (ref 4.22–5.81)
RDW: 15.8 % — ABNORMAL HIGH (ref 11.5–15.5)
WBC: 10 10*3/uL (ref 4.0–10.5)

## 2016-06-28 LAB — COMPREHENSIVE METABOLIC PANEL
ALT: 15 U/L — ABNORMAL LOW (ref 17–63)
AST: 17 U/L (ref 15–41)
Albumin: 3.8 g/dL (ref 3.5–5.0)
Alkaline Phosphatase: 80 U/L (ref 38–126)
Anion gap: 7 (ref 5–15)
BUN: 10 mg/dL (ref 6–20)
CO2: 26 mmol/L (ref 22–32)
Calcium: 8.7 mg/dL — ABNORMAL LOW (ref 8.9–10.3)
Chloride: 105 mmol/L (ref 101–111)
Creatinine, Ser: 0.84 mg/dL (ref 0.61–1.24)
GFR calc Af Amer: >60 mL/min (ref >60)
GFR calc non Af Amer: >60 mL/min (ref >60)
Glucose, Bld: 109 mg/dL — ABNORMAL HIGH (ref 65–99)
Potassium: 3.8 mmol/L (ref 3.5–5.1)
Sodium: 138 mmol/L (ref 135–145)
Total Bilirubin: 0.4 mg/dL (ref 0.3–1.2)
Total Protein: 7.2 g/dL (ref 6.5–8.1)

## 2016-06-28 MED ORDER — SODIUM CHLORIDE 0.9 % IV SOLN
200.0000 mg | Freq: Once | INTRAVENOUS | Status: AC
  Administered 2016-06-28: 200 mg via INTRAVENOUS
  Filled 2016-06-28: qty 8

## 2016-06-28 MED ORDER — SODIUM CHLORIDE 0.9 % IV SOLN
Freq: Once | INTRAVENOUS | Status: AC
  Administered 2016-06-28: 10:00:00 via INTRAVENOUS

## 2016-06-28 MED ORDER — SODIUM CHLORIDE 0.9% FLUSH: 10.0000 mL | INTRAVENOUS | Status: DC | PRN

## 2016-06-28 MED ORDER — HEPARIN SOD (PORK) LOCK FLUSH 100 UNIT/ML IV SOLN
500.0000 [IU] | Freq: Once | INTRAVENOUS | Status: AC | PRN
  Administered 2016-06-28: 500 [IU]

## 2016-06-28 NOTE — Patient Instructions (Signed)
[facility] Discharge Instructions for Patients Receiving Chemotherapy   Beginning January 23rd 2017 lab work for the Cancer Center will be done in the  Main lab at [facility] on 1st floor. If you have a lab appointment with the Cancer Center please come in thru the  Main Entrance and check in at the main information desk   Today you received the following chemotherapy agents:  Keytruda  If you develop nausea and vomiting, or diarrhea that is not controlled by your medication, call the clinic.  The clinic phone number is [phone]. Office hours are Monday-Friday 8:30am-5:00pm.  BELOW ARE SYMPTOMS THAT SHOULD BE REPORTED IMMEDIATELY:  *FEVER GREATER THAN 101.0 F  *CHILLS WITH OR WITHOUT FEVER  NAUSEA AND VOMITING THAT IS NOT CONTROLLED WITH YOUR NAUSEA MEDICATION  *UNUSUAL SHORTNESS OF BREATH  *UNUSUAL BRUISING OR BLEEDING  TENDERNESS IN MOUTH AND THROAT WITH OR WITHOUT PRESENCE OF ULCERS  *URINARY PROBLEMS  *BOWEL PROBLEMS  UNUSUAL RASH Items with * indicate a potential emergency and should be followed up as soon as possible. If you have an emergency after office hours please contact your primary care physician or go to the nearest emergency department.  Please call the clinic during office hours if you have any questions or concerns.   You may also contact the Patient Navigator at [phone] should you have any questions or need assistance in obtaining follow up care.      Resources For Cancer Patients and their Caregivers ? American Cancer Society: Can assist with transportation, wigs, general needs, runs Look Good Feel Better.        1-888-227-6333 ? Cancer Care: Provides financial assistance, online support groups, medication/co-pay assistance.  1-800-813-HOPE (4673) ? [facility] Assists [place] cancer patients and their families through emotional , educational and financial support.   336-427-4357 ? [place] DSS Where to apply for food stamps, Medicaid and utility assistance. 336-342-1394 ? RCATS: Transportation to medical appointments. 336-347-2287 ? Social Security Administration: May apply for disability if have a Stage IV cancer. 336-342-7796 1-800-772-1213 ? [place] Aging, Disability and Transit Services: Assists with nutrition, care and transit needs. 336-349-2343         

## 2016-06-28 NOTE — Progress Notes (Signed)
Nutrition Follow-up:  Nutrition follow-up completed during infusion this am.  Patient brother Darrell Christensen with him today.    Patient reports no nausea, vomiting or sore mouth, trouble chewing/swallowing.  Reports that he has been eating.  Goes out to eat for most meals and will prepare sandwiches or soup at home.  Reports he has been eating ice cream and popsicles. Can't drink more than 1 ensure plus per day due to causes diarrhea.  Medications: reviewed  Labs: reviewed  Anthropometrics:   Patient weight today 216 pounds 9.6 oz increased from weight on 2/2 of 213 pounds.   NUTRITION DIAGNOSIS: Inadequate oral intake improved    INTERVENTION:   Encouraged intake of high calorie/high protein foods Encouraged frequent meals and continue drinking oral nutrition supplement.      MONITORING, EVALUATION, GOAL: Patient will consume adequate calories and protein to maintain lean muscle mass   NEXT VISIT: as needed  Marcella Charlson B. Zenia Resides, [place], [place] Registered Dietitian [phone] (pager)

## 2016-06-28 NOTE — Progress Notes (Signed)
Tolerated infusion w/o adverse reaction.  Alert, in no distress.  VSS.  Discharged ambulatory in c/o family.  

## 2016-06-28 NOTE — Progress Notes (Signed)
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 Arnoldo Morale      04/01/2016 Initial Diagnosis    Primary cancer of right upper lobe of lung ([facility])     04/02/2016 Procedure    Ultrasound-guided core biopsy performed of a solid 1.5 cm soft tissue nodule in the right chest wall.      04/03/2016 Imaging    MRI brain- No acute and intracranial process or metastasis.  Moderate chronic small vessel ischemic disease.      04/04/2016 Pathology Results    Diagnosis Soft Tissue Needle Core Biopsy, Right Chest Wall SMOOTH MUSCLE NEOPLASM Microscopic Comment The differential diagnosis include low grade leiomyosarcoma. The neoplasm shows spindle cell morphology with rare mitotic figures and minimal atypia, no necrosis present. These cells stains positive for smooth muscle actin, desmin, negative for cytokeratin AE1&3, ck8/18, s100, cd117, cd 34 and cd99. This case also reviewed by Dr. [name] and agree.      04/05/2016 Procedure    CT-guided biopsy of right upper lobe lesion, with tissue specimen sent to pathology, by IR.      04/09/2016 Pathology Results    Lung, needle/core biopsy(ies), Right Upper Lobe - NON SMALL CELL CARCINOMA.      04/17/2016 Pathology Results    PDL1 High Expression.  TPS 50%.      04/24/2016 PET scan    1. Prominently hypermetabolic large right upper lobe mass with metastatic disease to the right paratracheal and right hilar nodal chains, to the descending mesocolon, to the left upper buttock subcutaneous tissues, to the left upper lobe, to the  right subscapularis muscle, and possibly to the left adrenal gland and right breast subcutaneous tissues. Appearance compatible with stage IV lung cancer. 2. There is some focal high activity in the ascending colon which is probably from peristalsis, less likely from a local colon mass. This does raise the possibility that the adjacent mesocolon tumor implant could be due to synchronous colon cancer rather than lung metastasis. 3. Other imaging findings of potential clinical significance: Coronary, aortic arch, and branch vessel atherosclerotic vascular disease. Aortoiliac atherosclerotic vascular disease. Deformity left proximal humerus and left glenohumeral joint from prior trauma. Enlarged prostate gland.      04/26/2016 Pathology Results    [facility]: Genomic alterations identified- KRAS U72Z, CREBBP splice site [id number], LRP1B S515*, KVQ25 Z56*, LOV56 splice site [id number], SPTA1 splice site [id number], TP53 C135Y.  Additional findings- MS-Stable, TMB-intermediate (14 Muts/Mb).  No reportable alterations- EGFR, ALK, BRAF, MET, ERBB2, RET, ROS1.      05/17/2016 -  Chemotherapy    The patient had ONDANSETRON IVPB [facility] +/- DEXAMETHASONE, , Intravenous,  Once, 0 of 1 cycle  pembrolizumab (KEYTRUDA) 200 mg in sodium chloride 0.9 % 50 mL chemo infusion, 200 mg, Intravenous, Once, 0 of 5 cycles  for chemotherapy treatment.         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  Two adjacent large masslike  areas of consolidation within the right upper lobe with differential considerations including malignancy or pneumonia. Multiple enlarged right hilar and mediastinal lymph nodes which may be reactive or metastatic in etiology.  Additional indeterminate pulmonary nodules as above. Recommend attention on follow-up.  Indeterminate 2.9 cm  left adrenal nodule. This needs dedicated evaluation with pre and post contrast-enhanced CT or MRI after confirmation of pulmonary process.  Indeterminate nodule within the right breast. Recommend dedicated evaluation with mammography.  Hepatic steatosis.      04/02/2016 Procedure    Ultrasound-guided core biopsy performed of a solid 1.5 cm soft tissue nodule in the right chest wall.      04/04/2016 Pathology Results    Diagnosis Soft Tissue Needle Core Biopsy, Right Chest Wall SMOOTH MUSCLE NEOPLASM Microscopic Comment The differential diagnosis include low grade leiomyosarcoma. The neoplasm shows spindle cell morphology with rare mitotic figures and minimal atypia, no necrosis present. These cells stains positive for smooth muscle actin, desmin, negative for cytokeratin AE1&3, ck8/18, s100, cd117, cd 34 and cd99. This case also reviewed by Dr. [name] and agree.         HISTORY OF PRESENT ILLNESS:  (From Kirby Crigler, PA-C's last note on 05/24/16)     INTERVAL HISTORY:  Mr. Darrell Christensen is here for follow-up and consideration for his next cycle of Keytruda.   He is seen in a treatment chair with his son by his side.  He is due for cycle #3 Keytruda today. Mr. Dieppa tells me he has tolerated treatment pretty well.  Endorses some fatigue, but is able to perform ADLs independently.  His appetite has been "fair"; Jennet Maduro, RD saw patient today as well for additional support.    Mr. Vincelette reports that his back has been itching; he states he was given some cream, but he lives alone and cannot apply it.  He removed his shirt to show me; there is a very faint rash noted on his back, but largely his skin is very dry.    Denies any diarrhea, chest pain, N&V, abdominal pain, or cold intolerance. Endorses occasional cough, "but it ain't too bad." He has some shortness of breath with exertion.  Reports that his urine has a strong odor after chemotherapy; denies any hematuria or dysuria.    He tells me how upset he has been that "some doctor told me that I would be dead in 4 months because I have 7 different types of cancer."  We spent quite a bit of time today reviewing his disease and prognosis.   He is using e-cigarettes. He pulled it out of his pocket during our visit and used it. Advised him to stop smoking.    REVIEW OF SYSTEMS:  Review of Systems - Oncology, per HPI    PAST MEDICAL/SURGICAL HISTORY:  Past Medical History:  Diagnosis Date  . Adrenal mass, left ([place]) 04/01/2016  . Diabetes mellitus ([facility]) 05/02/2016  . Diabetes mellitus without complication ([place] [facility])   . Hypertension   . Mass of breast, right 04/01/2016  . Mass of upper lobe of right lung 04/01/2016   2 adjacent, 5 cm masses, hilar adenopathy  . Primary cancer of right upper lobe of lung ([facility]) 04/01/2016   2 adjacent, 5 cm masses, hilar adenopathy  . Pulmonary embolus ([facility]) 03/31/2016  . Spindle cell sarcoma [facility])    Past Surgical History:  Procedure Laterality Date  . PORTACATH PLACEMENT Left 05/13/2016   Procedure: INSERTION PORT-A-CATH;  Surgeon: Aviva Signs, MD;  Location: [facility];  Service: General;  Laterality: Left;     SOCIAL HISTORY:  Social History   Social History  . Marital status: Divorced    Spouse name: N/A  . Number of children: N/A  . Years of education: N/A   Occupational History  . Not on file.   Social History Main Topics  . Smoking status: Former Smoker    Years: 61.00    Types: Cigarettes, E-cigarettes    Quit date: 04/11/2014  . Smokeless tobacco: Never Used     Comment: patient does vape smoking x 2 years  . Alcohol use No     Comment: quit 10 years  . Drug use: No  . Sexual activity: Not on file   Other Topics Concern  . Not on file   Social History Narrative  . No narrative on file    FAMILY HISTORY:  History reviewed. No pertinent family history.  CURRENT MEDICATIONS:  Outpatient Encounter Prescriptions as of 06/28/2016  Medication Sig Note   . ALPRAZolam (XANAX) 1 MG tablet Take 1 mg by mouth 3 (three) times daily as needed for anxiety.   Marland Kitchen amLODipine (NORVASC) 2.5 MG tablet Take 2.5 mg by mouth daily.   Marland Kitchen GLIPIZIDE XL 5 MG 24 hr tablet Take 5 mg by mouth daily.   . hydrocortisone cream 0.5 % Apply 1 application topically 2 (two) times daily.   Marland Kitchen lidocaine-prilocaine (EMLA) cream Apply a quarter size amount to affected area 1 hour prior to coming to chemotherapy. Cover with plastic wrap. 05/09/2016: Not started yet  . metFORMIN (GLUCOPHAGE) 500 MG tablet Take 500 mg by mouth 2 (two) times daily.   . metoprolol succinate (TOPROL-XL) 25 MG 24 hr tablet TAKE ONE TABLET BY MOUTH IN THE EVENING.   Marland Kitchen ondansetron (ZOFRAN) 8 MG tablet Take 8 mg by mouth every 8 (eight) hours as needed for nausea or vomiting.   Marland Kitchen oxyCODONE-acetaminophen (PERCOCET) 10-325 MG tablet Take 1 tablet by mouth every 4 (four) hours as needed for pain.   . Pembrolizumab (KEYTRUDA IV) Inject into the vein.   . predniSONE (DELTASONE) 20 MG tablet Take at the onset of diarrhea.  Take 5 tablets (100 mg) at one time.  Then call the [facility].   . prochlorperazine (COMPAZINE) 10 MG tablet  05/13/2016: Received from: External Pharmacy  . rivaroxaban (XARELTO) 20 MG TABS tablet Take 20 mg by mouth daily with supper. 05/09/2016: On hold due to upcoming procedure  . simvastatin (ZOCOR) 20 MG tablet Take 20 mg by mouth every evening.    No facility-administered encounter medications on file as of 06/28/2016.     ALLERGIES:  No Known Allergies   PHYSICAL EXAM:  ECOG Performance status: 1-2 - Symptomatic; requires occasional assistance      Physical Exam  Constitutional: He is oriented to person, place, and time and well-developed, well-nourished, and in no distress.  HENT:  Head: Normocephalic.  Mouth/Throat: Oropharynx is clear and moist. No oropharyngeal exudate.  Eyes: Conjunctivae are normal. Pupils are equal, round, and reactive to light. No scleral icterus.   Neck: Normal range of motion. Neck supple.  Cardiovascular: Normal rate, regular rhythm and normal heart sounds.   Pulmonary/Chest: Effort normal. No respiratory distress. He has decreased breath sounds in the right upper field, the right lower field and the left lower field.  Abdominal: Soft. Bowel sounds are normal. There is no tenderness.  Umbilical hernia present   Musculoskeletal: Normal range of motion. He exhibits edema (Trace ankle edema bilaterally).  Lymphadenopathy:  He has no cervical adenopathy.       Right: No supraclavicular adenopathy present.       Left: No supraclavicular adenopathy present.  Neurological: He is alert and oriented to person, place, and time. No cranial nerve deficit. Gait normal.  Skin: Skin is warm and dry. No rash noted.  Psychiatric: Mood, memory, affect and judgment normal.  Nursing note and vitals reviewed.    LABORATORY DATA:  I have reviewed the labs as listed.  CBC    Component Value Date/Time   WBC 10.0 06/28/2016 0905   RBC 4.92 06/28/2016 0905   HGB 13.7 06/28/2016 0905   HGB 13.5 05/02/2016 0852   HCT 41.6 06/28/2016 0905   HCT 41.0 05/02/2016 0852   PLT 317 06/28/2016 0905   PLT 436 (H) 05/02/2016 0852   MCV 84.6 06/28/2016 0905   MCV 85.7 05/02/2016 0852   MCH 27.8 06/28/2016 0905   MCHC 32.9 06/28/2016 0905   RDW 15.8 (H) 06/28/2016 0905   RDW 13.9 05/02/2016 0852   LYMPHSABS 3.8 06/28/2016 0905   LYMPHSABS 3.4 (H) 05/02/2016 0852   MONOABS 0.9 06/28/2016 0905   MONOABS 1.2 (H) 05/02/2016 0852   EOSABS 0.5 06/28/2016 0905   EOSABS 1.6 (H) 05/02/2016 0852   BASOSABS 0.1 06/28/2016 0905   BASOSABS 0.2 (H) 05/02/2016 0852   CMP Latest Ref Rng & Units 06/28/2016 06/07/2016 05/24/2016  Glucose 65 - 99 mg/dL 109(H) 78 127(H)  BUN 6 - 20 mg/dL '10 13 10  ' Creatinine 0.61 - 1.24 mg/dL 0.84 0.86 1.03  Sodium 135 - 145 mmol/L 138 139 136  Potassium 3.5 - 5.1 mmol/L 3.8 3.8 4.0  Chloride 101 - 111 mmol/L 105 107 102  CO2 22 - 32  mmol/L '26 25 25  ' Calcium 8.9 - 10.3 mg/dL 8.7(L) 8.5(L) 8.7(L)  Total Protein 6.5 - 8.1 g/dL 7.2 6.7 7.4  Total Bilirubin 0.3 - 1.2 mg/dL 0.4 0.4 0.5  Alkaline Phos 38 - 126 U/L 80 60 53  AST 15 - 41 U/L 17 13(L) 15  ALT 17 - 63 U/L 15(L) 12(L) 13(L)    PENDING LABS:    DIAGNOSTIC IMAGING:  Initial PET scan: 04/24/16     PATHOLOGY:  Right chest wall biopsy: 04/02/16   RUL Lung biopsy: 04/05/16    ASSESSMENT & PLAN:   Stage IV NSCLC with multiple sites of metastases:  -Reviewed last PET scan with patient and pointed out each location where there is likely cancer.  Educated him that he likely has 2 different types of cancer (lung cancer & sarcoma), with several sites of metastatic disease.  Reinforced that his life expectancy would have been quite short with no treatment; we hope Beryle Flock will extend his life expectancy. He understands that his disease is not curable, but is treatable. I provided support today with active listening and validation of his concerns.   -Labs reviewed today and are adequate for treatment. Cycle #3 Keytruda due today.   -Return to cancer center in 3 weeks for consideration of next cycle of therapy.  -Will consider restaging PET scan after cycle #4, as it will have been about 3 months since his initial PET at that time.    Right chest wall leiomyosarcoma:  -He will eventually need referral to Dr. Frederich Cha for surgical consideration and management of his sarcoma.  The patient was too upset today and confused about his 2 cancer diagnoses to discuss this referral at this visit.  We can discuss further at his  subsequent cycles of therapy.   Leukocytosis:  -Elevated WBCs with previous cycles of therapy.  -WBC 10.0 today and normal. Will continue to monitor.   Tobacco use disorder:  -Patient is using e-cigarettes regularly.  Advised him to quit all nicotine containing products, as the nicotine and additives in the liquid of e-cigarettes are likely  carcinogenic. Will continue to recommend cessation of all nicotine products.     Dispo:  -Return to cancer center in 3 weeks for next cycle Keytruda.    All questions were answered to patient's stated satisfaction. Encouraged patient to call with any new concerns or questions before his next visit to the cancer center and we can certain see him sooner, if needed.      Orders placed this encounter:  No orders of the defined types were placed in this encounter.     Mike Craze, NP [facility] [phone]

## 2016-06-28 NOTE — Patient Instructions (Addendum)
[place] at [facility] Discharge Instructions  RECOMMENDATIONS MADE BY THE CONSULTANT AND ANY TEST RESULTS WILL BE SENT TO YOUR REFERRING PHYSICIAN.  You were seen today by Mike Craze NP. Return to the clinic as scheduled for treatment and follow up with the provider in three weeks. Please see Amy as you leave for appointments.    Thank you for choosing [place] at [facility] to provide your oncology and hematology care.  To afford each patient quality time with our provider, please arrive at least 15 minutes before your scheduled appointment time.    If you have a lab appointment with the [facility] please come in thru the  Main Entrance and check in at the main information desk  You need to re-schedule your appointment should you arrive 10 or more minutes late.  We strive to give you quality time with our providers, and arriving late affects you and other patients whose appointments are after yours.  Also, if you no show three or more times for appointments you may be dismissed from the clinic at the providers discretion.     Again, thank you for choosing [facility].  Our hope is that these requests will decrease the amount of time that you wait before being seen by our physicians.       _____________________________________________________________  Should you have questions after your visit to [facility], please contact our office at [phone] between the hours of 8:30 a.m. and 4:30 p.m.  Voicemails left after 4:30 p.m. will not be returned until the following business day.  For prescription refill requests, have your pharmacy contact our office.       Resources For Cancer Patients and their Caregivers ? American Cancer Society: Can assist with transportation, wigs, general needs, runs Look Good Feel Better.        239-086-6917 ? Cancer Care: Provides financial assistance, online support  groups, medication/co-pay assistance.  1-800-813-HOPE 651-014-7018) ? [facility] Assists [place] cancer patients and their families through emotional , educational and financial support.  7704969893 ? [place] DSS Where to apply for food stamps, Medicaid and utility assistance. 786 324 5873 ? RCATS: Transportation to medical appointments. 984-750-0536 ? Social Security Administration: May apply for disability if have a Stage IV cancer. 580-525-2979 854 162 2400 ? [employer], Disability and Transit Services: Assists with nutrition, care and transit needs. [place] Support Programs: '@10RELATIVEDAYS'$ @ > Cancer Support Group  2nd Tuesday of the month 1pm-2pm, Journey Room  > Creative Journey  3rd Tuesday of the month 1130am-1pm, Journey Room  > Look Good Feel Better  1st Wednesday of the month 10am-12 noon, Journey Room (Call [facility] to register [phone])

## 2016-07-01 ENCOUNTER — Other Ambulatory Visit (HOSPITAL_COMMUNITY): Payer: Self-pay | Admitting: Oncology

## 2016-07-01 DIAGNOSIS — C3411 Malignant neoplasm of upper lobe, right bronchus or lung: Secondary | ICD-10-CM

## 2016-07-03 ENCOUNTER — Other Ambulatory Visit (HOSPITAL_COMMUNITY): Payer: Self-pay | Admitting: Oncology

## 2016-07-03 DIAGNOSIS — C3411 Malignant neoplasm of upper lobe, right bronchus or lung: Secondary | ICD-10-CM

## 2016-07-19 ENCOUNTER — Encounter (HOSPITAL_COMMUNITY): Payer: Self-pay

## 2016-07-19 ENCOUNTER — Encounter (HOSPITAL_BASED_OUTPATIENT_CLINIC_OR_DEPARTMENT_OTHER): Payer: Medicare Other

## 2016-07-19 ENCOUNTER — Encounter (HOSPITAL_COMMUNITY): Payer: Medicare Other | Attending: Hematology | Admitting: Hematology

## 2016-07-19 ENCOUNTER — Encounter (HOSPITAL_COMMUNITY): Payer: Medicare Other

## 2016-07-19 VITALS — BP 145/78 | HR 68 | Temp 97.4°F | Resp 18 | Wt 220.3 lb

## 2016-07-19 DIAGNOSIS — G893 Neoplasm related pain (acute) (chronic): Secondary | ICD-10-CM | POA: Diagnosis not present

## 2016-07-19 DIAGNOSIS — C3411 Malignant neoplasm of upper lobe, right bronchus or lung: Secondary | ICD-10-CM

## 2016-07-19 DIAGNOSIS — E278 Other specified disorders of adrenal gland: Secondary | ICD-10-CM

## 2016-07-19 DIAGNOSIS — C49A9 Gastrointestinal stromal tumor of other sites: Secondary | ICD-10-CM

## 2016-07-19 DIAGNOSIS — Z5112 Encounter for antineoplastic immunotherapy: Secondary | ICD-10-CM

## 2016-07-19 DIAGNOSIS — C499 Malignant neoplasm of connective and soft tissue, unspecified: Secondary | ICD-10-CM

## 2016-07-19 DIAGNOSIS — I2692 Saddle embolus of pulmonary artery without acute cor pulmonale: Secondary | ICD-10-CM

## 2016-07-19 LAB — CBC WITH DIFFERENTIAL/PLATELET
Basophils Absolute: 0.1 10*3/uL (ref 0.0–0.1)
Basophils Relative: 1 %
Eosinophils Absolute: 0.4 10*3/uL (ref 0.0–0.7)
Eosinophils Relative: 4 %
HCT: 43.5 % (ref 39.0–52.0)
Hemoglobin: 14 g/dL (ref 13.0–17.0)
Lymphocytes Relative: 43 %
Lymphs Abs: 4.3 10*3/uL — ABNORMAL HIGH (ref 0.7–4.0)
MCH: 27.2 pg (ref 26.0–34.0)
MCHC: 32.2 g/dL (ref 30.0–36.0)
MCV: 84.5 fL (ref 78.0–100.0)
Monocytes Absolute: 0.8 10*3/uL (ref 0.1–1.0)
Monocytes Relative: 8 %
Neutro Abs: 4.5 10*3/uL (ref 1.7–7.7)
Neutrophils Relative %: 44 %
Platelets: 301 10*3/uL (ref 150–400)
RBC: 5.15 MIL/uL (ref 4.22–5.81)
RDW: 16.4 % — ABNORMAL HIGH (ref 11.5–15.5)
WBC: 10 10*3/uL (ref 4.0–10.5)

## 2016-07-19 LAB — COMPREHENSIVE METABOLIC PANEL
ALT: 19 U/L (ref 17–63)
AST: 19 U/L (ref 15–41)
Albumin: 4.1 g/dL (ref 3.5–5.0)
Alkaline Phosphatase: 71 U/L (ref 38–126)
Anion gap: 9 (ref 5–15)
BUN: 12 mg/dL (ref 6–20)
CO2: 26 mmol/L (ref 22–32)
Calcium: 9 mg/dL (ref 8.9–10.3)
Chloride: 104 mmol/L (ref 101–111)
Creatinine, Ser: 0.99 mg/dL (ref 0.61–1.24)
GFR calc Af Amer: >60 mL/min (ref >60)
GFR calc non Af Amer: >60 mL/min (ref >60)
Glucose, Bld: 112 mg/dL — ABNORMAL HIGH (ref 65–99)
Potassium: 3.6 mmol/L (ref 3.5–5.1)
Sodium: 139 mmol/L (ref 135–145)
Total Bilirubin: 0.3 mg/dL (ref 0.3–1.2)
Total Protein: 7.1 g/dL (ref 6.5–8.1)

## 2016-07-19 LAB — TSH: TSH: 2.966 u[IU]/mL (ref 0.350–4.500)

## 2016-07-19 MED ORDER — SODIUM CHLORIDE 0.9 % IV SOLN
Freq: Once | INTRAVENOUS | Status: AC
  Administered 2016-07-19: 11:00:00 via INTRAVENOUS

## 2016-07-19 MED ORDER — HEPARIN SOD (PORK) LOCK FLUSH 100 UNIT/ML IV SOLN
500.0000 [IU] | Freq: Once | INTRAVENOUS | Status: AC | PRN
  Administered 2016-07-19: 500 [IU]
  Filled 2016-07-19: qty 5

## 2016-07-19 MED ORDER — SODIUM CHLORIDE 0.9% FLUSH
10.0000 mL | INTRAVENOUS | Status: DC | PRN
  Administered 2016-07-19: 10 mL
  Filled 2016-07-19: qty 10

## 2016-07-19 MED ORDER — SODIUM CHLORIDE 0.9 % IV SOLN
200.0000 mg | Freq: Once | INTRAVENOUS | Status: AC
  Administered 2016-07-19: 200 mg via INTRAVENOUS
  Filled 2016-07-19: qty 8

## 2016-07-19 MED ORDER — HYDROCODONE-ACETAMINOPHEN 5-325 MG PO TABS: 1.0000 | ORAL_TABLET | Freq: Four times a day (QID) | ORAL | 0 refills | Status: DC | PRN

## 2016-07-19 NOTE — Patient Instructions (Signed)
[facility] Discharge Instructions for Patients Receiving Chemotherapy   Beginning January 23rd 2017 lab work for the [facility] will be done in the  Main lab at [facility] on 1st floor. If you have a lab appointment with the [facility] please come in thru the  Main Entrance and check in at the main information desk   Today you received the following chemotherapy agents Keytruda. Follow-up as scheduled. Call clinic for any questions or concerns  To help prevent nausea and vomiting after your treatment, we encourage you to take your nausea medication   If you develop nausea and vomiting, or diarrhea that is not controlled by your medication, call the clinic.  The clinic phone number is [phone]. Office hours are Monday-Friday 8:30am-5:00pm.  BELOW ARE SYMPTOMS THAT SHOULD BE REPORTED IMMEDIATELY:  *FEVER GREATER THAN 101.0 F  *CHILLS WITH OR WITHOUT FEVER  NAUSEA AND VOMITING THAT IS NOT CONTROLLED WITH YOUR NAUSEA MEDICATION  *UNUSUAL SHORTNESS OF BREATH  *UNUSUAL BRUISING OR BLEEDING  TENDERNESS IN MOUTH AND THROAT WITH OR WITHOUT PRESENCE OF ULCERS  *URINARY PROBLEMS  *BOWEL PROBLEMS  UNUSUAL RASH Items with * indicate a potential emergency and should be followed up as soon as possible. If you have an emergency after office hours please contact your primary care physician or go to the nearest emergency department.  Please call the clinic during office hours if you have any questions or concerns.   You may also contact the Patient Navigator at [phone] should you have any questions or need assistance in obtaining follow up care.      Resources For Cancer Patients and their Caregivers ? American Cancer Society: Can assist with transportation, wigs, general needs, runs Look Good Feel Better.        234 315 4608 ? Cancer Care: Provides financial assistance, online support groups, medication/co-pay assistance.  1-800-813-HOPE  819-051-1979) ? [place] Assists [place] cancer patients and their families through emotional , educational and financial support.  (607)527-4372 ? [place] DSS Where to apply for food stamps, Medicaid and utility assistance. 6190971692 ? RCATS: Transportation to medical appointments. 757-084-2137 ? Social Security Administration: May apply for disability if have a Stage IV cancer. 670-082-0259 (854) 108-6042 ? [employer], Disability and Transit Services: Assists with nutrition, care and transit needs. ([phone]

## 2016-07-19 NOTE — Progress Notes (Signed)
Nutrition Follow-up:  Nutrition follow-up completed in infusion this am.  Patient with right lung carcinoma with metastatic disease.  Patient to received [place] today.  Daughter with patient.  Patient reports that he is feeling better today.  Reports that he is eating a little more sweets that he has in the past.  I asked him to explain what he was eating and he said "oh a cookie here and a cookie there."  Reports he generally does not eat much first thing when he gets up. Yesterday morning had 2 pork chop biscuits that he didn't like but ate anyway.  Reports that he is out of ensure plus and would like some more.    Reports no trouble chewing, swallowing or sores in mouth. Bowels are moving regularly.    Medications: reviewed  Labs: reviewed  Anthropometrics:   Weight has increased to 220 pounds today from last follow-up weight of 216 pounds 9.6 oz on 2/23.      NUTRITION DIAGNOSIS: Inadequate oral intake improved    INTERVENTION:   Patient given 2nd case of ensure plus today. Encouraged patient to continue to consume high calorie/high protein foods.    MONITORING, EVALUATION, GOAL: Patient will consume adequate calories and protein to maintain lean muscle mass   NEXT VISIT: April 6 during infusion  Hiawatha Dressel B. Zenia Resides, [place], [place] Registered Dietitian [phone] (pager)

## 2016-07-19 NOTE — Progress Notes (Signed)
Darrell Christensen tolerated chemo tx well without complaints or incident. Labs reviewed prior to administering chemotherapy. VSS upon discharge. Pt discharged self ambulatory in satisfactory condition accompanied by his daughter

## 2016-07-19 NOTE — Patient Instructions (Signed)
[place] at [facility] Discharge Instructions  RECOMMENDATIONS MADE BY THE CONSULTANT AND ANY TEST RESULTS WILL BE SENT TO YOUR REFERRING PHYSICIAN.  You were seen today by Dr. Irene Limbo We will schedule you for PET scan and labs Follow up with your next cycle. See Amy up front for appointments   Thank you for choosing [place] at [facility] to provide your oncology and hematology care.  To afford each patient quality time with our provider, please arrive at least 15 minutes before your scheduled appointment time.    If you have a lab appointment with the [facility] please come in thru the  Main Entrance and check in at the main information desk  You need to re-schedule your appointment should you arrive 10 or more minutes late.  We strive to give you quality time with our providers, and arriving late affects you and other patients whose appointments are after yours.  Also, if you no show three or more times for appointments you may be dismissed from the clinic at the providers discretion.     Again, thank you for choosing [facility].  Our hope is that these requests will decrease the amount of time that you wait before being seen by our physicians.       _____________________________________________________________  Should you have questions after your visit to [facility], please contact our office at [phone] between the hours of 8:30 a.m. and 4:30 p.m.  Voicemails left after 4:30 p.m. will not be returned until the following business day.  For prescription refill requests, have your pharmacy contact our office.       Resources For Cancer Patients and their Caregivers ? American Cancer Society: Can assist with transportation, wigs, general needs, runs Look Good Feel Better.        (984)144-5309 ? Cancer Care: Provides financial assistance, online support groups, medication/co-pay assistance.   1-800-813-HOPE 815 312 3932) ? [facility] Assists [place] cancer patients and their families through emotional , educational and financial support.  573-837-9664 ? [place] DSS Where to apply for food stamps, Medicaid and utility assistance. 2291372000 ? [employer]: Transportation to medical appointments. (548)083-1463 ? Social Security Administration: May apply for disability if have a Stage IV cancer. 281-489-8821 234-866-9123 ? [employer], Disability and Transit Services: Assists with nutrition, care and transit needs. [place] Support Programs: '@10RELATIVEDAYS'$ @ > Cancer Support Group  2nd Tuesday of the month 1pm-2pm, Journey Room  > Creative Journey  3rd Tuesday of the month 1130am-1pm, Journey Room  > Look Good Feel Better  1st Wednesday of the month 10am-12 noon, Journey Room (Call [place] to register [phone])

## 2016-07-20 NOTE — Progress Notes (Signed)
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 Arnoldo Morale      04/01/2016 Initial Diagnosis    Primary cancer of right upper lobe of lung ([place])     04/02/2016 Procedure    Ultrasound-guided core biopsy performed of a solid 1.5 cm soft tissue nodule in the right chest wall.      04/03/2016 Imaging    MRI brain- No acute and intracranial process or metastasis.  Moderate chronic small vessel ischemic disease.      04/04/2016 Pathology Results    Diagnosis Soft Tissue Needle Core Biopsy, Right Chest Wall SMOOTH MUSCLE NEOPLASM Microscopic Comment The differential diagnosis include low grade leiomyosarcoma. The neoplasm shows spindle cell morphology with rare mitotic figures and minimal atypia, no necrosis present. These cells stains positive for smooth muscle actin, desmin, negative for cytokeratin AE1&3, ck8/18, s100, cd117, cd 34 and cd99. This case also reviewed by Dr. [name] and agree.      04/05/2016 Procedure    CT-guided biopsy of right upper lobe lesion, with tissue specimen sent to pathology, by IR.      04/09/2016 Pathology Results    Lung, needle/core biopsy(ies), Right Upper Lobe - NON SMALL CELL CARCINOMA.      04/17/2016 Pathology Results    PDL1 High Expression.  TPS 50%.      04/24/2016 PET scan    1. Prominently hypermetabolic large right upper lobe mass with metastatic disease to the right paratracheal and right hilar nodal chains, to the descending mesocolon, to the left upper buttock subcutaneous tissues, to the left upper lobe, to the right subscapularis muscle, and possibly to the left adrenal  gland and right breast subcutaneous tissues. Appearance compatible with stage IV lung cancer. 2. There is some focal high activity in the ascending colon which is probably from peristalsis, less likely from a local colon mass. This does raise the possibility that the adjacent mesocolon tumor implant could be due to synchronous colon cancer rather than lung metastasis. 3. Other imaging findings of potential clinical significance: Coronary, aortic arch, and branch vessel atherosclerotic vascular disease. Aortoiliac atherosclerotic vascular disease. Deformity left proximal humerus and left glenohumeral joint from prior trauma. Enlarged prostate gland.      04/26/2016 Pathology Results    [facility]: Genomic alterations identified- KRAS J09T, CREBBP splice site [id number], LRP1B S515*, YKD98 P38*, SNK53 splice site [id number], SPTA1 splice site [id number], TP53 C135Y.  Additional findings- MS-Stable, TMB-intermediate (14 Muts/Mb).  No reportable alterations- EGFR, ALK, BRAF, MET, ERBB2, RET, ROS1.      05/17/2016 -  Chemotherapy    The patient had ONDANSETRON IVPB [facility] +/- DEXAMETHASONE, , Intravenous,  Once, 0 of 1 cycle  pembrolizumab (KEYTRUDA) 200 mg in sodium chloride 0.9 % 50 mL chemo infusion, 200 mg, Intravenous, Once, 0 of 5 cycles  for chemotherapy treatment.         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  Two adjacent large masslike areas of consolidation within the right upper lobe with differential  considerations including malignancy or pneumonia. Multiple enlarged right hilar and mediastinal lymph nodes which may be reactive or metastatic in etiology.  Additional indeterminate pulmonary nodules as above. Recommend attention on follow-up.  Indeterminate 2.9 cm left adrenal nodule. This needs dedicated evaluation with pre  and post contrast-enhanced CT or MRI after confirmation of pulmonary process.  Indeterminate nodule within the right breast. Recommend dedicated evaluation with mammography.  Hepatic steatosis.      04/02/2016 Procedure    Ultrasound-guided core biopsy performed of a solid 1.5 cm soft tissue nodule in the right chest wall.      04/04/2016 Pathology Results    Diagnosis Soft Tissue Needle Core Biopsy, Right Chest Wall SMOOTH MUSCLE NEOPLASM Microscopic Comment The differential diagnosis include low grade leiomyosarcoma. The neoplasm shows spindle cell morphology with rare mitotic figures and minimal atypia, no necrosis present. These cells stains positive for smooth muscle actin, desmin, negative for cytokeratin AE1&3, ck8/18, s100, cd117, cd 34 and cd99. This case also reviewed by Dr. [name] and agree.        INTERVAL HISTORY:  Darrell Christensen is here for Prior to his fourth cycle of Pembrolizumab for treatment of PDL 1 high non-small cell lung cancer. He notes no acute toxicities from treatment. Reports no rash no diarrhea no other focal symptoms. Breathing is stable. Notes about a 5 pound weight gain and has been eating well. He understands that he has a second type of tumor in the chest wall that is currently being monitored.  Notes that he is not comfortable taking the Percocet he was prescribed for pain and prefers to use Norco instead. Is only needing pain medications once or twice a day and his cancer related pain has been well-controlled. Patient has limited awareness of his medical conditions. Speaks with profanity's about his other physicians. Needs repeated redirection to stay on topic. Accompanied by his daughter on this clinic visit.  REVIEW OF SYSTEMS:    10 Point review of systems of done and is negative except as noted above.  . Past Medical History:  Diagnosis Date  . Adrenal mass, left ([place]) 04/01/2016  . Diabetes mellitus ([place]) 05/02/2016  . Diabetes  mellitus without complication ([place])   . Hypertension   . Mass of breast, right 04/01/2016  . Mass of upper lobe of right lung 04/01/2016   2 adjacent, 5 cm masses, hilar adenopathy  . Primary cancer of right upper lobe of lung ([place]) 04/01/2016   2 adjacent, 5 cm masses, hilar adenopathy  . Pulmonary embolus ([place]) 03/31/2016  . 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[place])     . Past Surgical History:  Procedure Laterality Date  . PORTACATH PLACEMENT Left 05/13/2016   Procedure: INSERTION PORT-A-CATH;  Surgeon: Aviva Signs, MD;  Location: [facility];  Service: General;  Laterality: Left;    . Social History  Substance Use Topics  . Smoking status: Former Smoker    Years: 61.00    Types: Cigarettes, E-cigarettes    Quit date: 04/11/2014  . Smokeless tobacco: Never Used     Comment: patient does vape smoking x 2 years  . Alcohol use No     Comment: quit 10 years    ALLERGIES:  has No Known Allergies.  MEDICATIONS:  Current Outpatient Prescriptions  Medication Sig Dispense Refill  . ALPRAZolam (XANAX) 1 MG tablet Take 1 mg by mouth 3 (three) times daily as needed for anxiety.    Darrell Christensen amLODipine (NORVASC) 2.5 MG tablet Take 2.5 mg by mouth daily.  4  .  GLIPIZIDE XL 5 MG 24 hr tablet Take 5 mg by mouth daily.  4  . HYDROcodone-acetaminophen (NORCO/VICODIN) 5-325 MG tablet Take 1-2 tablets by mouth every 6 (six) hours as needed for moderate pain. 60 tablet 0  . hydrocortisone cream 0.5 % Apply 1 application topically 2 (two) times daily. 30 g 0  . lidocaine-prilocaine (EMLA) cream Apply a quarter size amount to affected area 1 hour prior to coming to chemotherapy. Cover with plastic wrap. 30 g 2  . metFORMIN (GLUCOPHAGE) 500 MG tablet Take 500 mg by mouth 2 (two) times daily.  4  . metoprolol succinate (TOPROL-XL) 25 MG 24 hr tablet TAKE ONE TABLET BY MOUTH IN THE EVENING. 30 tablet 0  . ondansetron (ZOFRAN) 8 MG tablet Take 8 mg by mouth every 8 (eight) hours as needed for nausea or vomiting.    .  Pembrolizumab (KEYTRUDA IV) Inject into the vein.    . predniSONE (DELTASONE) 20 MG tablet Take at the onset of diarrhea.  Take 5 tablets (100 mg) at one time.  Then call the [place]. 30 tablet 0  . prochlorperazine (COMPAZINE) 10 MG tablet   1  . rivaroxaban (XARELTO) 20 MG TABS tablet Take 20 mg by mouth daily with supper.    . simvastatin (ZOCOR) 20 MG tablet Take 20 mg by mouth every evening.  5   No current facility-administered medications for this visit.     PHYSICAL EXAMINATION: ECOG PERFORMANCE STATUS: 2 - Symptomatic, <50% confined to bed  VS as per EPIC GENERAL:alert, in no acute distress and comfortable SKIN: no acute rashes, no significant lesions EYES: conjunctiva are pink and non-injected, sclera anicteric OROPHARYNX: MMM, no exudates, no oropharyngeal erythema or ulceration NECK: supple, no JVD LYMPH:  no palpable lymphadenopathy in the cervical, axillary or inguinal regions LUNGS: decreased breath sounds b/l, clear to auscultation b/l with normal respiratory effort HEART: regular rate & rhythm ABDOMEN:  normoactive bowel sounds , non tender, not distended. Extremity: b/l 1+ pedal edema PSYCH: alert & oriented x 3 with fluent speech NEURO: no focal motor/sensory deficits  LABORATORY DATA:   I have reviewed the data as listed  . CBC Latest Ref Rng & Units 07/19/2016 06/28/2016 06/07/2016  WBC 4.0 - 10.5 K/uL 10.0 10.0 13.8(H)  Hemoglobin 13.0 - 17.0 g/dL 14.0 13.7 12.9(L)  Hematocrit 39.0 - 52.0 % 43.5 41.6 39.8  Platelets 150 - 400 K/uL 301 317 353    . CMP Latest Ref Rng & Units 07/19/2016 06/28/2016 06/07/2016  Glucose 65 - 99 mg/dL 112(H) 109(H) 78  BUN 6 - 20 mg/dL _0 Creatinine 0.61 - 1.24 mg/dL 0.99 0.84 0.86  Sodium 135 - 145 mmol/L 139 138 139  Potassium 3.5 - 5.1 mmol/L 3.6 3.8 3.8  Chloride 101 - 111 mmol/L 104 105 107  CO2 22 - 32 mmol/L _1 Calcium 8.9 - 10.3 mg/dL 9.0 8.7(L) 8.5(L)  Total Protein 6.5 - 8.1 g/dL 7.1 7.2 6.7    Total Bilirubin 0.3 - 1.2 mg/dL 0.3 0.4 0.4  Alkaline Phos 38 - 126 U/L 71 80 60  AST 15 - 41 U/L 19 17 13(L)  ALT 17 - 63 U/L 19 15(L) 12(L)     RADIOGRAPHIC STUDIES: I have personally reviewed the radiological images as listed and agreed with the findings in the report. No results found.  ASSESSMENT & PLAN:   #1 Stage IV NSCLC with multiple sites of metastases , PDL1 high Plan -Patient reports no prohibitive  toxicities to Pembrolizumab -he is appropriate to proceed with his fourth cycle of treatment. -No acute new symptomatology. -Will get a PET CT scan prior to his next cycle of treatment to evaluate response to current treatment. Last PET scan was in December 2017.  2) Right chest wall leiomyosarcoma -Patient aware of this diagnosis. -Given his overall prognosis is not sure that he would like any specific surgical evaluation for this currently. -Currently no significant new symptoms. Change will need to be evaluated on his upcoming PET/CT scan to determine follow-up versus palliative radiation vs surgery.  3) Neoplasm related pain. -Patient reports that he is uncomfortable using Percocet. Couldn't tell me if he has filled the prescription or not and then noted that he submitted the prescription  to the pharmacy did not pick it up. -He prefers to use norco instead. -Patient was given a prescription for Norco which he previously was using after his port procedure. -Informed our RN to call pharmacy and cancel his Percocet prescription.  Return to care in 3 weeks with labs and PET/CT scan for continued management  I spent 20 minutes counseling the patient face to face. The total time spent in the appointment was 30 minutes and more than 50% was on counseling and direct patient cares.    Sullivan Lone MD [place] AAHIVMS [facility] Hematology/Oncology Physician [facility]  (Office):       [phone] (Work cell):  [phone] (Fax):           [phone]

## 2016-07-23 ENCOUNTER — Other Ambulatory Visit (HOSPITAL_COMMUNITY): Payer: Self-pay | Admitting: Oncology

## 2016-07-23 DIAGNOSIS — C3411 Malignant neoplasm of upper lobe, right bronchus or lung: Secondary | ICD-10-CM

## 2016-08-06 ENCOUNTER — Ambulatory Visit (HOSPITAL_COMMUNITY)
Admission: RE | Admit: 2016-08-06 | Discharge: 2016-08-06 | Disposition: A | Discharge status: Home / Self Care | Payer: Medicare Other | Source: Ambulatory Visit | Attending: Hematology | Admitting: Hematology

## 2016-08-06 DIAGNOSIS — M12812 Other specific arthropathies, not elsewhere classified, left shoulder: Secondary | ICD-10-CM | POA: Insufficient documentation

## 2016-08-06 DIAGNOSIS — C499 Malignant neoplasm of connective and soft tissue, unspecified: Secondary | ICD-10-CM | POA: Insufficient documentation

## 2016-08-06 DIAGNOSIS — I700 Atherosclerosis of aorta: Secondary | ICD-10-CM | POA: Insufficient documentation

## 2016-08-06 DIAGNOSIS — E279 Disorder of adrenal gland, unspecified: Secondary | ICD-10-CM | POA: Insufficient documentation

## 2016-08-06 DIAGNOSIS — R918 Other nonspecific abnormal finding of lung field: Secondary | ICD-10-CM | POA: Insufficient documentation

## 2016-08-06 DIAGNOSIS — C3411 Malignant neoplasm of upper lobe, right bronchus or lung: Secondary | ICD-10-CM | POA: Insufficient documentation

## 2016-08-06 DIAGNOSIS — I2510 Atherosclerotic heart disease of native coronary artery without angina pectoris: Secondary | ICD-10-CM | POA: Insufficient documentation

## 2016-08-06 DIAGNOSIS — N6310 Unspecified lump in the right breast, unspecified quadrant: Secondary | ICD-10-CM | POA: Insufficient documentation

## 2016-08-06 LAB — GLUCOSE, CAPILLARY: Glucose-Capillary: 100 mg/dL — ABNORMAL HIGH (ref 65–99)

## 2016-08-06 MED ORDER — FLUDEOXYGLUCOSE F - 18 (FDG) INJECTION
10.8000 | Freq: Once | INTRAVENOUS | Status: AC | PRN
  Administered 2016-08-06: 10.8 via INTRAVENOUS

## 2016-08-09 ENCOUNTER — Encounter (HOSPITAL_COMMUNITY): Payer: Medicare Other | Attending: Oncology

## 2016-08-09 ENCOUNTER — Encounter (HOSPITAL_COMMUNITY): Payer: Medicare Other

## 2016-08-09 ENCOUNTER — Encounter (HOSPITAL_COMMUNITY): Payer: Self-pay

## 2016-08-09 ENCOUNTER — Other Ambulatory Visit (HOSPITAL_COMMUNITY): Payer: Self-pay | Admitting: Oncology

## 2016-08-09 VITALS — BP 154/89 | HR 65 | Temp 98.6°F | Resp 16 | Wt 229.4 lb

## 2016-08-09 DIAGNOSIS — I2699 Other pulmonary embolism without acute cor pulmonale: Secondary | ICD-10-CM | POA: Diagnosis not present

## 2016-08-09 DIAGNOSIS — C493 Malignant neoplasm of connective and soft tissue of thorax: Secondary | ICD-10-CM

## 2016-08-09 DIAGNOSIS — Z7901 Long term (current) use of anticoagulants: Secondary | ICD-10-CM

## 2016-08-09 DIAGNOSIS — Z5112 Encounter for antineoplastic immunotherapy: Secondary | ICD-10-CM

## 2016-08-09 DIAGNOSIS — C7972 Secondary malignant neoplasm of left adrenal gland: Secondary | ICD-10-CM

## 2016-08-09 DIAGNOSIS — C3411 Malignant neoplasm of upper lobe, right bronchus or lung: Secondary | ICD-10-CM | POA: Insufficient documentation

## 2016-08-09 LAB — COMPREHENSIVE METABOLIC PANEL
ALT: 16 U/L — ABNORMAL LOW (ref 17–63)
AST: 15 U/L (ref 15–41)
Albumin: 4 g/dL (ref 3.5–5.0)
Alkaline Phosphatase: 83 U/L (ref 38–126)
Anion gap: 7 (ref 5–15)
BUN: 12 mg/dL (ref 6–20)
CO2: 27 mmol/L (ref 22–32)
Calcium: 9 mg/dL (ref 8.9–10.3)
Chloride: 105 mmol/L (ref 101–111)
Creatinine, Ser: 0.92 mg/dL (ref 0.61–1.24)
GFR calc Af Amer: >60 mL/min (ref >60)
GFR calc non Af Amer: >60 mL/min (ref >60)
Glucose, Bld: 98 mg/dL (ref 65–99)
Potassium: 3.8 mmol/L (ref 3.5–5.1)
Sodium: 139 mmol/L (ref 135–145)
Total Bilirubin: 0.3 mg/dL (ref 0.3–1.2)
Total Protein: 7 g/dL (ref 6.5–8.1)

## 2016-08-09 LAB — CBC WITH DIFFERENTIAL/PLATELET
Basophils Absolute: 0.1 10*3/uL (ref 0.0–0.1)
Basophils Relative: 1 %
Eosinophils Absolute: 0.4 10*3/uL (ref 0.0–0.7)
Eosinophils Relative: 4 %
HCT: 44.4 % (ref 39.0–52.0)
Hemoglobin: 14.7 g/dL (ref 13.0–17.0)
Lymphocytes Relative: 42 %
Lymphs Abs: 4.6 10*3/uL — ABNORMAL HIGH (ref 0.7–4.0)
MCH: 28.1 pg (ref 26.0–34.0)
MCHC: 33.1 g/dL (ref 30.0–36.0)
MCV: 84.7 fL (ref 78.0–100.0)
Monocytes Absolute: 1.1 10*3/uL — ABNORMAL HIGH (ref 0.1–1.0)
Monocytes Relative: 10 %
Neutro Abs: 4.8 10*3/uL (ref 1.7–7.7)
Neutrophils Relative %: 43 %
Platelets: 277 10*3/uL (ref 150–400)
RBC: 5.24 MIL/uL (ref 4.22–5.81)
RDW: 16.3 % — ABNORMAL HIGH (ref 11.5–15.5)
WBC: 11 10*3/uL — ABNORMAL HIGH (ref 4.0–10.5)

## 2016-08-09 MED ORDER — HEPARIN SOD (PORK) LOCK FLUSH 100 UNIT/ML IV SOLN
500.0000 [IU] | Freq: Once | INTRAVENOUS | Status: AC | PRN
  Administered 2016-08-09: 500 [IU]

## 2016-08-09 MED ORDER — SODIUM CHLORIDE 0.9% FLUSH
10.0000 mL | INTRAVENOUS | Status: DC | PRN
  Administered 2016-08-09: 10 mL
  Filled 2016-08-09: qty 10

## 2016-08-09 MED ORDER — PEMBROLIZUMAB CHEMO INJECTION 100 MG/4ML
200.0000 mg | Freq: Once | INTRAVENOUS | Status: AC
  Administered 2016-08-09: 200 mg via INTRAVENOUS
  Filled 2016-08-09: qty 8

## 2016-08-09 MED ORDER — SODIUM CHLORIDE 0.9 % IV SOLN
Freq: Once | INTRAVENOUS | Status: AC
Start: 2016-08-09 — End: 2016-08-09
  Administered 2016-08-09: 11:00:00 via INTRAVENOUS

## 2016-08-09 MED ORDER — EUCERIN EX CREA: TOPICAL_CREAM | CUTANEOUS | 1 refills | Status: DC | PRN

## 2016-08-09 MED ORDER — HEPARIN SOD (PORK) LOCK FLUSH 100 UNIT/ML IV SOLN
INTRAVENOUS | Status: AC
  Filled 2016-08-09: qty 5

## 2016-08-09 NOTE — Progress Notes (Signed)
Labs reviewed by Dr. Oliva Bustard, proceed with treatment.   Chemotherapy given today per orders , patient tolerated it well, no issues. Vitals stable and discharged home from clinic. Follow up as scheduled.

## 2016-08-09 NOTE — Progress Notes (Signed)
[facility] Hematology/Oncology Progress Note  Name: Darrell Christensen      MRN: [id number]      Date: 08/09/2016 Time:8:22 AM   REFERRING PHYSICIAN:  Curt Bears, MD (Medical Oncology)  REASON FOR CONSULT:  Stage IV NSCLC, adenocarcinoma and right chest low grade leiomyosarcoma   DIAGNOSIS:  Stage IV (T3N2M1B) Adenocarcinoma of right upper lobe, mediastinal lymphadenopathy, and left adrenal metastases in addition to a right chest wall low-grade leiomyosarcoma.    Primary cancer of right upper lobe of lung ([place])   05/14/2015 Procedure    Port placement by Dr. Arnoldo Morale      04/01/2016 Initial Diagnosis    Primary cancer of right upper lobe of lung ([place])     04/02/2016 Procedure    Ultrasound-guided core biopsy performed of a solid 1.5 cm soft tissue nodule in the right chest wall.      04/03/2016 Imaging    MRI brain- No acute and intracranial process or metastasis.  Moderate chronic small vessel ischemic disease.      04/04/2016 Pathology Results    Diagnosis Soft Tissue Needle Core Biopsy, Right Chest Wall SMOOTH MUSCLE NEOPLASM Microscopic Comment The differential diagnosis include low grade leiomyosarcoma. The neoplasm shows spindle cell morphology with rare mitotic figures and minimal atypia, no necrosis present. These cells stains positive for smooth muscle actin, desmin, negative for cytokeratin AE1&3, ck8/18, s100, cd117, cd 34 and cd99. This case also reviewed by Dr. [name] and agree.      04/05/2016 Procedure    CT-guided biopsy of right upper lobe lesion, with tissue specimen sent to pathology, by IR.      04/09/2016 Pathology Results    Lung, needle/core biopsy(ies), Right Upper Lobe - NON SMALL CELL CARCINOMA.      04/17/2016 Pathology Results    PDL1 High Expression.  TPS 50%.      04/24/2016 PET scan    1. Prominently hypermetabolic large right upper lobe mass with metastatic disease to the right paratracheal and right hilar  nodal chains, to the descending mesocolon, to the left upper buttock subcutaneous tissues, to the left upper lobe, to the right subscapularis muscle, and possibly to the left adrenal gland and right breast subcutaneous tissues. Appearance compatible with stage IV lung cancer. 2. There is some focal high activity in the ascending colon which is probably from peristalsis, less likely from a local colon mass. This does raise the possibility that the adjacent mesocolon tumor implant could be due to synchronous colon cancer rather than lung metastasis. 3. Other imaging findings of potential clinical significance: Coronary, aortic arch, and branch vessel atherosclerotic vascular disease. Aortoiliac atherosclerotic vascular disease. Deformity left proximal humerus and left glenohumeral joint from prior trauma. Enlarged prostate gland.      04/26/2016 Pathology Results    FoundationONE: Genomic alterations identified- KRAS F09N, CREBBP splice site [id number], LRP1B S515*, KGU54 Y70*, WCB76 splice site [id number], SPTA1 splice site [id number], TP53 C135Y.  Additional findings- MS-Stable, TMB-intermediate (14 Muts/Mb).  No reportable alterations- EGFR, ALK, BRAF, MET, ERBB2, RET, ROS1.      05/17/2016 -  Chemotherapy    The patient had ONDANSETRON IVPB [facility] +/- DEXAMETHASONE, , Intravenous,  Once, 0 of 1 cycle  pembrolizumab (KEYTRUDA) 200 mg in sodium chloride 0.9 % 50 mL chemo infusion, 200 mg, Intravenous, Once, 0 of 5 cycles  for chemotherapy treatment.         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HCC)   03/31/2016 Imaging  CT angio chest- Small central filling defect within a right middle lobe pulmonary artery concerning for pulmonary embolism.  Two adjacent large masslike areas of consolidation within the right upper lobe with differential considerations including malignancy or pneumonia. Multiple enlarged right hilar and mediastinal lymph nodes which may be reactive or metastatic in  etiology.  Additional indeterminate pulmonary nodules as above. Recommend attention on follow-up.  Indeterminate 2.9 cm left adrenal nodule. This needs dedicated evaluation with pre and post contrast-enhanced CT or MRI after confirmation of pulmonary process.  Indeterminate nodule within the right breast. Recommend dedicated evaluation with mammography.  Hepatic steatosis.      04/02/2016 Procedure    Ultrasound-guided core biopsy performed of a solid 1.5 cm soft tissue nodule in the right chest wall.      04/04/2016 Pathology Results    Diagnosis Soft Tissue Needle Core Biopsy, Right Chest Wall SMOOTH MUSCLE NEOPLASM Microscopic Comment The differential diagnosis include low grade leiomyosarcoma. The neoplasm shows spindle cell morphology with rare mitotic figures and minimal atypia, no necrosis present. These cells stains positive for smooth muscle actin, desmin, negative for cytokeratin AE1&3, ck8/18, s100, cd117, cd 34 and cd99. This case also reviewed by Dr. [name] and agree.       HISTORY OF PRESENT ILLNESS:   Darrell Christensen is a 71 y.o. male who returns for follow up of Stage IV (T3N2M1B) Adenocarcinoma of right upper lobe, mediastinal lymphadenopathy, and left adrenal metastases; NOT biopsy proven Stage IV disease.   PDL1- high expressor 50%. AND Right chest wall low-grade leiomyosarcoma. AND PE, right middle lobe, small central filling defect.  He is doing okay today. He has a skin rash on his chest, abdomen, back, and groin. Pt also has some dry skin. He doesn't use anything other than hydrocortisone cream occasionally to relieve this. He still smokes with a vape (3% nictoine) daily. Denies any other concerns.   Review of Systems  Constitutional: Negative.   HENT: Negative.   Eyes: Negative.   Respiratory: Negative.   Cardiovascular: Negative.   Gastrointestinal: Negative.   Genitourinary: Negative.   Musculoskeletal: Negative.   Skin: Positive for  rash (chest, abdomen, back, groin).       Dry skin  Neurological: Negative.   Endo/Heme/Allergies: Negative.   Psychiatric/Behavioral: Negative.   All other systems reviewed and are negative.  PAST MEDICAL HISTORY:   Past Medical History:  Diagnosis Date   Adrenal mass, left ([place]) 04/01/2016   Diabetes mellitus ([place]) 05/02/2016   Diabetes mellitus without complication ([place])    Hypertension    Mass of breast, right 04/01/2016   Mass of upper lobe of right lung 04/01/2016   2 adjacent, 5 cm masses, hilar adenopathy   Primary cancer of right upper lobe of lung ([place]) 04/01/2016   2 adjacent, 5 cm masses, hilar adenopathy   Pulmonary embolus ([place]) 03/31/2016   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HCC)     ALLERGIES: No Known Allergies    MEDICATIONS: I have reviewed the patient's current medications.    Current Outpatient Prescriptions on File Prior to Visit  Medication Sig Dispense Refill   ALPRAZolam (XANAX) 1 MG tablet Take 1 mg by mouth 3 (three) times daily as needed for anxiety.     amLODipine (NORVASC) 2.5 MG tablet Take 2.5 mg by mouth daily.  4   GLIPIZIDE XL 5 MG 24 hr tablet Take 5 mg by mouth daily.  4   HYDROcodone-acetaminophen (NORCO/VICODIN) 5-325 MG tablet Take 1-2 tablets by mouth every 6 (six)  hours as needed for moderate pain. 60 tablet 0   hydrocortisone cream 0.5 % Apply 1 application topically 2 (two) times daily. 30 g 0   lidocaine-prilocaine (EMLA) cream Apply a quarter size amount to affected area 1 hour prior to coming to chemotherapy. Cover with plastic wrap. 30 g 2   metFORMIN (GLUCOPHAGE) 500 MG tablet Take 500 mg by mouth 2 (two) times daily.  4   metoprolol succinate (TOPROL-XL) 25 MG 24 hr tablet TAKE ONE TABLET BY MOUTH IN THE EVENING. 30 tablet 0   ondansetron (ZOFRAN) 8 MG tablet Take 8 mg by mouth every 8 (eight) hours as needed for nausea or vomiting.     Pembrolizumab (KEYTRUDA IV) Inject into the vein.     predniSONE (DELTASONE) 20 MG  tablet Take at the onset of diarrhea.  Take 5 tablets (100 mg) at one time.  Then call the [place]. 30 tablet 0   prochlorperazine (COMPAZINE) 10 MG tablet   1   rivaroxaban (XARELTO) 20 MG TABS tablet Take 20 mg by mouth daily with supper.     simvastatin (ZOCOR) 20 MG tablet Take 20 mg by mouth every evening.  5   No current facility-administered medications on file prior to visit.      PAST SURGICAL HISTORY Past Surgical History:  Procedure Laterality Date   PORTACATH PLACEMENT Left 05/13/2016   Procedure: INSERTION PORT-A-CATH;  Surgeon: Aviva Signs, MD;  Location: [facility];  Service: General;  Laterality: Left;    FAMILY HISTORY: Mother deceased at the age of 69 from stroke. Father deceased from complications associated with DM at the age of 38. 2 biological brother deceased:  29 years old- deceased from chronic medical issues  72 year old- deceased from internal hemorrhage due to complications associated with EtOHism He has 1 daughter 5 years old with HTN, hypercholesterolemia. 1 son deceased at the age of 32 (24) from a work accident.  SOCIAL HISTORY: Tobacco abuse, quit 2 years ago after smoking 3 ppd x 61 years.  He is now vaping.  He admits to a significant EtOH abuse, quitting 8 years ago.  He admits to daily EtOH abuse.  He denies any illicit drug abuse.  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  He is Baptist in religion, but he admits to not being strong in his faith.  He is married x 2 and divorced x 2.    Social History   Social History   Marital status: Divorced    Spouse name: N/A   Number of children: N/A   Years of education: N/A   Social History Main Topics   Smoking status: Former Smoker    Years: 61.00    Types: Cigarettes, E-cigarettes    Quit date: 04/11/2014   Smokeless tobacco: Never Used     Comment: patient does vape smoking x 2 years   Alcohol use No     Comment: quit 10 years   Drug use: No   Sexual activity: Not  on file   Other Topics Concern   Not on file   Social History Narrative   No narrative on file   PERFORMANCE STATUS: The patient's performance status is 1 - Symptomatic but completely ambulatory  PHYSICAL EXAM: Most Recent Vital Signs: There were no vitals taken for this visit. Physical Exam  Constitutional: He is oriented to person, place, and time and well-developed, well-nourished, and in no distress.  HENT:  Head: Normocephalic and atraumatic.  Mouth/Throat: Oropharynx is clear  and moist.  Eyes: Conjunctivae and EOM are normal. Pupils are equal, round, and reactive to light.  Neck: Normal range of motion. Neck supple.  Cardiovascular: Normal rate, regular rhythm and normal heart sounds.   Pulmonary/Chest: Effort normal and breath sounds normal.  Abdominal: Soft. Bowel sounds are normal.  Musculoskeletal: Normal range of motion.  Neurological: He is alert and oriented to person, place, and time. Gait normal.  Skin: Skin is warm and dry.  Nursing note and vitals reviewed.  LABORATORY DATA:  CBC    Component Value Date/Time   WBC 10.0 07/19/2016 0918   RBC 5.15 07/19/2016 0918   HGB 14.0 07/19/2016 0918   HGB 13.5 05/02/2016 0852   HCT 43.5 07/19/2016 0918   HCT 41.0 05/02/2016 0852   PLT 301 07/19/2016 0918   PLT 436 (H) 05/02/2016 0852   MCV 84.5 07/19/2016 0918   MCV 85.7 05/02/2016 0852   MCH 27.2 07/19/2016 0918   MCHC 32.2 07/19/2016 0918   RDW 16.4 (H) 07/19/2016 0918   RDW 13.9 05/02/2016 0852   LYMPHSABS 4.3 (H) 07/19/2016 0918   LYMPHSABS 3.4 (H) 05/02/2016 0852   MONOABS 0.8 07/19/2016 0918   MONOABS 1.2 (H) 05/02/2016 0852   EOSABS 0.4 07/19/2016 0918   EOSABS 1.6 (H) 05/02/2016 0852   BASOSABS 0.1 07/19/2016 0918   BASOSABS 0.2 (H) 05/02/2016 0852   CMP Latest Ref Rng & Units 07/19/2016 06/28/2016 06/07/2016  Glucose 65 - 99 mg/dL 112(H) 109(H) 78  BUN 6 - 20 mg/dL '12 10 13  ' Creatinine 0.61 - 1.24 mg/dL 0.99 0.84 0.86  Sodium 135 - 145 mmol/L 139  138 139  Potassium 3.5 - 5.1 mmol/L 3.6 3.8 3.8  Chloride 101 - 111 mmol/L 104 105 107  CO2 22 - 32 mmol/L '26 26 25  ' Calcium 8.9 - 10.3 mg/dL 9.0 8.7(L) 8.5(L)  Total Protein 6.5 - 8.1 g/dL 7.1 7.2 6.7  Total Bilirubin 0.3 - 1.2 mg/dL 0.3 0.4 0.4  Alkaline Phos 38 - 126 U/L 71 80 60  AST 15 - 41 U/L 19 17 13(L)  ALT 17 - 63 U/L 19 15(L) 12(L)     Chemistry      Component Value Date/Time   NA 139 07/19/2016 0918   NA 139 05/02/2016 0852   K 3.6 07/19/2016 0918   K 4.2 05/02/2016 0852   CL 104 07/19/2016 0918   CO2 26 07/19/2016 0918   CO2 24 05/02/2016 0852   BUN 12 07/19/2016 0918   BUN 10.6 05/02/2016 0852   CREATININE 0.99 07/19/2016 0918   CREATININE 1.0 05/02/2016 0852      Component Value Date/Time   CALCIUM 9.0 07/19/2016 0918   CALCIUM 9.2 05/02/2016 0852   ALKPHOS 71 07/19/2016 0918   ALKPHOS 73 05/02/2016 0852   AST 19 07/19/2016 0918   AST 12 05/02/2016 0852   ALT 19 07/19/2016 0918   ALT 14 05/02/2016 0852   BILITOT 0.3 07/19/2016 0918   BILITOT 0.42 05/02/2016 0852     RADIOGRAPHY: I have personally reviewed the radiological images as listed and agreed with the findings in the report.  PET Scan 08/06/2016 IMPRESSION: 1. Overall considerable improvement. The right upper lobe mass is markedly reduced in volume and SUV. Thoracic adenopathy have resolved and the hypermetabolic lesion in the right subscapularis muscle has also resolved. 2. The left upper lobe nodule is no longer appreciably hypermetabolic, and is highly indistinct although this may be due to motion artifact. Faint in indistinct nodularity elsewhere  in the lungs likewise not currently hypermetabolic. 3. There is some very faint residual low-grade activity along the subcutaneous deposit along the upper left buttock region. Marked reduction in size and activity of the tumor deposit adjacent to the descending colon. 4. Prior right pleural effusion has resolved. 5. Stable appearance of the  adrenal glands, with low-level activity and a nodule in the left adrenal gland. 6. Stable small nodule in the right breast, low-grade metabolic activity with maximum SUV 2.2 (formerly 3.0) 7. Other imaging findings of potential clinical significance: Severe arthropathy of the left glenohumeral joint. Coronary, aortic arch, and branch vessel atherosclerotic vascular disease. Aortoiliac atherosclerotic vascular disease. Prominent prostate gland indents the bladder base.   PATHOLOGY:     ASSESSMENT/PLAN:   Primary cancer of right upper lobe of lung (HCC) Stage IV (T3N2M1B) adenocarcinoma of right upper lobe, mediastinal lymphadenopathy, descending mesocolon (synchronous colon cancer), subcutaneous (left upper buttock, right subscapularis muscle) and left adrenal metastases.  Stage IV disease is NOT biopsy proven but note that attempts to biopsy prove revealed a low grade leiomyosarcoma.  PDL1- high expressor 50%. He is a candidate for [place] front line.   Oncology history developed.  Staging in [facility] problem list completed.  Labs on 05/02/2016 reviewed: CBC diff, CMET.  I personally reviewed and went over laboratory results with the patient.  The results are noted within this dictation.  Leukocytosis and thrombocytosis are noted.  Will refer patient to Gen Surgeon for port placement in anticipation of future chemotherapy consisting Carboplatin AUC 5/Alimta/Avastin every 21 days as this was discussed with the patient in [place] with Dr. Earlie Server. He is however a candidate for [employer] frontline.  I reviewed the risks, benefits, alternatives, and side effects of systemic chemotherapy including, but not limited to, alopecia, myelosuppression, nausea, vomiting, peripheral neuropathy, liver and renal dysfunction.  I have discussed case with Dr. Arnoldo Morale (Gen Surg) and he has agreed to see the patient beginning of next week for consultation and subsequent port placement.  He was previously referred to  Dr. Sondra Come for XRT in [place], but the patient has declined this intervention at this time.  Will refer patient to Nurse Navigator, Anderson Malta, for chemotherapy teaching.  I have refilled his metoprolol.  Return for follow-up either on day 1 of cycle 1 or for NADIR check 7-10 days following cycle 1 of treatment with labs.  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  Will need to follow with future imaging.  If patient does well with treatment, this will need to be addressed in the future.  Pulmonary embolus (HCC) PE, right middle lobe, small central filling defect.  On Xarelto anticoagulation.  I spoke with the patient about Keytruda as well. He and his daughter will think about options. I advised them that if he chooses [place] up front he will be restaged and if stable disease or improved he will continue, if he progresses we can move forward with carbo/alimta as discussed.  // I have reviewed the PET scan with the patient in detail. He is responding to treatment; continue.   I have recommended Eucerin cream for his rash and dry skin. He lives alone and isn't able to apply it to his back.   He will return for follow up in 3 weeks.   MEDICATIONS PRESCRIBED THIS ENCOUNTER: Patient  will continue KEYTRUDA, if CBC and comprehensive panel meets the criteria for continuing  treatment  PET scan of April 3 has been reviewed independently sews continuing response also was reviewed with the patient   Dry skin and rash secondary to [facility] will prescribe Eucerin cream  No diarrhea  TSH will be rechecked during next appointment All questions were answered. The patient knows to call the clinic with any problems, questions or concerns. We can certainly see the patient much sooner if necessary.  This document serves as a record of services personally performed by  Forest Gleason, MD. It was created on her behalf by Martinique Casey, a trained medical scribe. The creation of this record is based on the scribe's personal observations and the provider's statements to them. This document has been checked and approved by the attending provider.  I have reviewed the above documentation for accuracy and completeness and I agree with the above.  This note is electronically signed by: Martinique M Casey 08/09/2016 8:22 AM

## 2016-08-09 NOTE — Addendum Note (Signed)
Addended by: Forest Gleason on: 08/09/2016 09:54 AM   Modules accepted: Orders

## 2016-08-09 NOTE — Patient Instructions (Signed)
[facility] Discharge Instructions for Patients Receiving Chemotherapy   Beginning January 23rd 2017 lab work for the Cancer Center will be done in the  Main lab at [facility] on 1st floor. If you have a lab appointment with the Cancer Center please come in thru the  Main Entrance and check in at the main information desk   Today you received the following chemotherapy agents   To help prevent nausea and vomiting after your treatment, we encourage you to take your nausea medication     If you develop nausea and vomiting, or diarrhea that is not controlled by your medication, call the clinic.  The clinic phone number is [phone]. Office hours are Monday-Friday 8:30am-5:00pm.  BELOW ARE SYMPTOMS THAT SHOULD BE REPORTED IMMEDIATELY:  *FEVER GREATER THAN 101.0 F  *CHILLS WITH OR WITHOUT FEVER  NAUSEA AND VOMITING THAT IS NOT CONTROLLED WITH YOUR NAUSEA MEDICATION  *UNUSUAL SHORTNESS OF BREATH  *UNUSUAL BRUISING OR BLEEDING  TENDERNESS IN MOUTH AND THROAT WITH OR WITHOUT PRESENCE OF ULCERS  *URINARY PROBLEMS  *BOWEL PROBLEMS  UNUSUAL RASH Items with * indicate a potential emergency and should be followed up as soon as possible. If you have an emergency after office hours please contact your primary care physician or go to the nearest emergency department.  Please call the clinic during office hours if you have any questions or concerns.   You may also contact the Patient Navigator at [phone] should you have any questions or need assistance in obtaining follow up care.      Resources For Cancer Patients and their Caregivers ? American Cancer Society: Can assist with transportation, wigs, general needs, runs Look Good Feel Better.        1-888-227-6333 ? Cancer Care: Provides financial assistance, online support groups, medication/co-pay assistance.  1-800-813-HOPE (4673) ? [facility] Assists [place] cancer  patients and their families through emotional , educational and financial support.  336-427-4357 ? [place] DSS Where to apply for food stamps, Medicaid and utility assistance. 336-342-1394 ? [facility]: Transportation to medical appointments. 336-347-2287 ? Social Security Administration: May apply for disability if have a Stage IV cancer. 336-342-7796 1-800-772-1213 ? [place] Aging, Disability and Transit Services: Assists with nutrition, care and transit needs. 336-349-2343         

## 2016-08-09 NOTE — Patient Instructions (Signed)
[facility] [facility] at [facility] Discharge Instructions  RECOMMENDATIONS MADE BY THE CONSULTANT AND ANY TEST RESULTS WILL BE SENT TO YOUR REFERRING PHYSICIAN.  You were seen today by Dr. Twana First We called in a prescription for Eucerin cream, you can apply to dry skin as needed.   Follow up in 3 weeks for treatment and lab work See Amy up front for appointments   Thank you for choosing [place] at [facility] to provide your oncology and hematology care.  To afford each patient quality time with our provider, please arrive at least 15 minutes before your scheduled appointment time.    If you have a lab appointment with the [facility] please come in thru the  Main Entrance and check in at the main information desk  You need to re-schedule your appointment should you arrive 10 or more minutes late.  We strive to give you quality time with our providers, and arriving late affects you and other patients whose appointments are after yours.  Also, if you no show three or more times for appointments you may be dismissed from the clinic at the providers discretion.     Again, thank you for choosing [facility].  Our hope is that these requests will decrease the amount of time that you wait before being seen by our physicians.       _____________________________________________________________  Should you have questions after your visit to [facility], please contact our office at [phone] between the hours of 8:30 a.m. and 4:30 p.m.  Voicemails left after 4:30 p.m. will not be returned until the following business day.  For prescription refill requests, have your pharmacy contact our office.       Resources For Cancer Patients and their Caregivers ? American Cancer Society: Can assist with transportation, wigs, general needs, runs Look Good Feel Better.        (623)495-4495 ? Cancer Care: Provides financial  assistance, online support groups, medication/co-pay assistance.  1-800-813-HOPE 4073245432) ? [facility] Assists [place] cancer patients and their families through emotional , educational and financial support.  920-167-2276 ? [place] DSS Where to apply for food stamps, Medicaid and utility assistance. 4343421988 ? RCATS: Transportation to medical appointments. (803) 253-7963 ? Social Security Administration: May apply for disability if have a Stage IV cancer. (317) 695-6512 (305)452-0703 ? [employer], Disability and Transit Services: Assists with nutrition, care and transit needs. [place] Support Programs: '@10RELATIVEDAYS'$ @ > Cancer Support Group  2nd Tuesday of the month 1pm-2pm, Journey Room  > Creative Journey  3rd Tuesday of the month 1130am-1pm, Journey Room  > Look Good Feel Better  1st Wednesday of the month 10am-12 noon, Journey Room (Call [place] to register [phone])

## 2016-08-09 NOTE — Progress Notes (Signed)
Nutrition Follow-up:  Nutrition follow-up completed during infusion this am.  Patient with right lung carcinoma with metastatic disease.  Daughter with patient.  Patient reports has been eating more sweets that usual but overall appetite has been good.     Medications: reviewed  Labs: reviewed  Anthropometrics:   Weight has increased to 229 lb today from 220 lb on 3/16  Patient reports fluid accumulation in ankles (more on left than right)   NUTRITION DIAGNOSIS: Inadequate oral intake improved    INTERVENTION:   RN to discuss ankle swelling with MD Choski.   Encouraged patient to continue with good nutrition.      MONITORING, EVALUATION, GOAL: Patient will consume adequate calories and protein to maintain lean muscle mass.   NEXT VISIT: as needed  Meika Earll B. Zenia Resides, [place], [place] Registered Dietitian [phone] (pager)

## 2016-08-26 ENCOUNTER — Other Ambulatory Visit (HOSPITAL_COMMUNITY): Payer: Self-pay | Admitting: Oncology

## 2016-08-26 DIAGNOSIS — C3411 Malignant neoplasm of upper lobe, right bronchus or lung: Secondary | ICD-10-CM

## 2016-09-02 ENCOUNTER — Encounter (HOSPITAL_BASED_OUTPATIENT_CLINIC_OR_DEPARTMENT_OTHER): Payer: Medicare Other

## 2016-09-02 ENCOUNTER — Encounter (HOSPITAL_COMMUNITY): Payer: Self-pay

## 2016-09-02 ENCOUNTER — Other Ambulatory Visit (HOSPITAL_COMMUNITY): Payer: Self-pay | Admitting: Oncology

## 2016-09-02 ENCOUNTER — Encounter (HOSPITAL_BASED_OUTPATIENT_CLINIC_OR_DEPARTMENT_OTHER): Payer: Medicare Other | Admitting: Oncology

## 2016-09-02 VITALS — BP 148/78 | HR 66 | Temp 97.8°F | Resp 18 | Wt 226.0 lb

## 2016-09-02 DIAGNOSIS — Z5112 Encounter for antineoplastic immunotherapy: Secondary | ICD-10-CM | POA: Diagnosis not present

## 2016-09-02 DIAGNOSIS — C3411 Malignant neoplasm of upper lobe, right bronchus or lung: Secondary | ICD-10-CM

## 2016-09-02 DIAGNOSIS — C493 Malignant neoplasm of connective and soft tissue of thorax: Secondary | ICD-10-CM

## 2016-09-02 DIAGNOSIS — C499 Malignant neoplasm of connective and soft tissue, unspecified: Secondary | ICD-10-CM

## 2016-09-02 LAB — COMPREHENSIVE METABOLIC PANEL
ALT: 19 U/L (ref 17–63)
AST: 19 U/L (ref 15–41)
Albumin: 4.1 g/dL (ref 3.5–5.0)
Alkaline Phosphatase: 75 U/L (ref 38–126)
Anion gap: 8 (ref 5–15)
BUN: 10 mg/dL (ref 6–20)
CO2: 26 mmol/L (ref 22–32)
Calcium: 9 mg/dL (ref 8.9–10.3)
Chloride: 103 mmol/L (ref 101–111)
Creatinine, Ser: 0.92 mg/dL (ref 0.61–1.24)
GFR calc Af Amer: >60 mL/min (ref >60)
GFR calc non Af Amer: >60 mL/min (ref >60)
Glucose, Bld: 103 mg/dL — ABNORMAL HIGH (ref 65–99)
Potassium: 3.7 mmol/L (ref 3.5–5.1)
Sodium: 137 mmol/L (ref 135–145)
Total Bilirubin: 0.6 mg/dL (ref 0.3–1.2)
Total Protein: 7.3 g/dL (ref 6.5–8.1)

## 2016-09-02 LAB — CBC WITH DIFFERENTIAL/PLATELET
Basophils Absolute: 0.1 10*3/uL (ref 0.0–0.1)
Basophils Relative: 1 %
Eosinophils Absolute: 0.5 10*3/uL (ref 0.0–0.7)
Eosinophils Relative: 5 %
HCT: 45.9 % (ref 39.0–52.0)
Hemoglobin: 15.2 g/dL (ref 13.0–17.0)
Lymphocytes Relative: 36 %
Lymphs Abs: 3.9 10*3/uL (ref 0.7–4.0)
MCH: 28.1 pg (ref 26.0–34.0)
MCHC: 33.1 g/dL (ref 30.0–36.0)
MCV: 85 fL (ref 78.0–100.0)
Monocytes Absolute: 0.8 10*3/uL (ref 0.1–1.0)
Monocytes Relative: 7 %
Neutro Abs: 5.7 10*3/uL (ref 1.7–7.7)
Neutrophils Relative %: 51 %
Platelets: 266 10*3/uL (ref 150–400)
RBC: 5.4 MIL/uL (ref 4.22–5.81)
RDW: 16.4 % — ABNORMAL HIGH (ref 11.5–15.5)
WBC: 10.9 10*3/uL — ABNORMAL HIGH (ref 4.0–10.5)

## 2016-09-02 LAB — TSH: TSH: 2.612 u[IU]/mL (ref 0.350–4.500)

## 2016-09-02 MED ORDER — HEPARIN SOD (PORK) LOCK FLUSH 100 UNIT/ML IV SOLN
500.0000 [IU] | Freq: Once | INTRAVENOUS | Status: AC | PRN
Start: 2016-09-02 — End: 2016-09-02
  Administered 2016-09-02: 500 [IU]
  Filled 2016-09-02: qty 5

## 2016-09-02 MED ORDER — SODIUM CHLORIDE 0.9 % IV SOLN
200.0000 mg | Freq: Once | INTRAVENOUS | Status: AC
  Administered 2016-09-02: 200 mg via INTRAVENOUS
  Filled 2016-09-02: qty 8

## 2016-09-02 MED ORDER — SODIUM CHLORIDE 0.9% FLUSH
10.0000 mL | INTRAVENOUS | Status: DC | PRN
  Administered 2016-09-02: 10 mL
  Filled 2016-09-02: qty 10

## 2016-09-02 MED ORDER — SODIUM CHLORIDE 0.9 % IV SOLN
Freq: Once | INTRAVENOUS | Status: AC
  Administered 2016-09-02: 11:00:00 via INTRAVENOUS

## 2016-09-02 NOTE — Progress Notes (Signed)
Darrell Christensen given today per orders. Patient tolerated it well, no issues. Vitals stable and discharged home ambulatory from clinic.follow up as scheduled.

## 2016-09-02 NOTE — Patient Instructions (Addendum)
[place] at [facility] Discharge Instructions  RECOMMENDATIONS MADE BY THE CONSULTANT AND ANY TEST RESULTS WILL BE SENT TO YOUR REFERRING PHYSICIAN.  You were seen today by Dr. Twana First Next treatment in 3 weeks Follow up in 3 weeks   Thank you for choosing [place] at [facility] to provide your oncology and hematology care.  To afford each patient quality time with our provider, please arrive at least 15 minutes before your scheduled appointment time.    If you have a lab appointment with the [facility] please come in thru the  Main Entrance and check in at the main information desk  You need to re-schedule your appointment should you arrive 10 or more minutes late.  We strive to give you quality time with our providers, and arriving late affects you and other patients whose appointments are after yours.  Also, if you no show three or more times for appointments you may be dismissed from the clinic at the providers discretion.     Again, thank you for choosing [facility].  Our hope is that these requests will decrease the amount of time that you wait before being seen by our physicians.       _____________________________________________________________  Should you have questions after your visit to [facility], please contact our office at [phone] between the hours of 8:30 a.m. and 4:30 p.m.  Voicemails left after 4:30 p.m. will not be returned until the following business day.  For prescription refill requests, have your pharmacy contact our office.       Resources For Cancer Patients and their Caregivers ? American Cancer Society: Can assist with transportation, wigs, general needs, runs Look Good Feel Better.        973-861-2431 ? Cancer Care: Provides financial assistance, online support groups, medication/co-pay assistance.  1-800-813-HOPE 678-571-7737) ? [facility] Assists [place] cancer patients and their families through emotional , educational and financial support.  747-415-7334 ? [place] DSS Where to apply for food stamps, Medicaid and utility assistance. (587) 260-0499 ? [facility]: Transportation to medical appointments. 518-451-7351 ? Social Security Administration: May apply for disability if have a Stage IV cancer. (585)336-0601 (414) 502-4518 ? [employer], Disability and Transit Services: Assists with nutrition, care and transit needs. [place] Support Programs: '@10RELATIVEDAYS'$ @ > Cancer Support Group  2nd Tuesday of the month 1pm-2pm, Journey Room  > Creative Journey  3rd Tuesday of the month 1130am-1pm, Journey Room  > Look Good Feel Better  1st Wednesday of the month 10am-12 noon, Journey Room (Call [facility] to register [phone])

## 2016-09-02 NOTE — Patient Instructions (Signed)
[facility] Discharge Instructions for Patients Receiving Chemotherapy   Beginning January 23rd 2017 lab work for the Cancer Center will be done in the  Main lab at [facility] on 1st floor. If you have a lab appointment with the Cancer Center please come in thru the  Main Entrance and check in at the main information desk   Today you received the following chemotherapy agents   To help prevent nausea and vomiting after your treatment, we encourage you to take your nausea medication     If you develop nausea and vomiting, or diarrhea that is not controlled by your medication, call the clinic.  The clinic phone number is [phone]. Office hours are Monday-Friday 8:30am-5:00pm.  BELOW ARE SYMPTOMS THAT SHOULD BE REPORTED IMMEDIATELY:  *FEVER GREATER THAN 101.0 F  *CHILLS WITH OR WITHOUT FEVER  NAUSEA AND VOMITING THAT IS NOT CONTROLLED WITH YOUR NAUSEA MEDICATION  *UNUSUAL SHORTNESS OF BREATH  *UNUSUAL BRUISING OR BLEEDING  TENDERNESS IN MOUTH AND THROAT WITH OR WITHOUT PRESENCE OF ULCERS  *URINARY PROBLEMS  *BOWEL PROBLEMS  UNUSUAL RASH Items with * indicate a potential emergency and should be followed up as soon as possible. If you have an emergency after office hours please contact your primary care physician or go to the nearest emergency department.  Please call the clinic during office hours if you have any questions or concerns.   You may also contact the Patient Navigator at [phone] should you have any questions or need assistance in obtaining follow up care.      Resources For Cancer Patients and their Caregivers ? American Cancer Society: Can assist with transportation, wigs, general needs, runs Look Good Feel Better.        1-888-227-6333 ? Cancer Care: Provides financial assistance, online support groups, medication/co-pay assistance.  1-800-813-HOPE (4673) ? [facility] Assists [place] cancer  patients and their families through emotional , educational and financial support.  336-427-4357 ? [place] DSS Where to apply for food stamps, Medicaid and utility assistance. 336-342-1394 ? RCATS: Transportation to medical appointments. 336-347-2287 ? Social Security Administration: May apply for disability if have a Stage IV cancer. 336-342-7796 1-800-772-1213 ? [place] [facility], Disability and Transit Services: Assists with nutrition, care and transit needs. 336-349-2343         

## 2016-09-02 NOTE — Progress Notes (Signed)
[place], [place] [address]   CLINIC:  Medical Oncology/Hematology Progress Note  PCP:  Wende Neighbors, MD [place] [address] [phone]   REASON FOR VISIT:  Follow-up for Stage IV NSCLC AND right chest wall leiomyosarcoma   CURRENT THERAPY: Keytruda every 21 days    BRIEF ONCOLOGIC HISTORY:    Primary cancer of right upper lobe of lung ([place])   05/14/2015 Procedure    Port placement by Dr. Arnoldo Morale      04/01/2016 Initial Diagnosis    Primary cancer of right upper lobe of lung ([place])     04/02/2016 Procedure    Ultrasound-guided core biopsy performed of a solid 1.5 cm soft tissue nodule in the right chest wall.      04/03/2016 Imaging    MRI brain- No acute and intracranial process or metastasis.  Moderate chronic small vessel ischemic disease.      04/04/2016 Pathology Results    Diagnosis Soft Tissue Needle Core Biopsy, Right Chest Wall SMOOTH MUSCLE NEOPLASM Microscopic Comment The differential diagnosis include low grade leiomyosarcoma. The neoplasm shows spindle cell morphology with rare mitotic figures and minimal atypia, no necrosis present. These cells stains positive for smooth muscle actin, desmin, negative for cytokeratin AE1&3, ck8/18, s100, cd117, cd 34 and cd99. This case also reviewed by Dr. [name] and agree.      04/05/2016 Procedure    CT-guided biopsy of right upper lobe lesion, with tissue specimen sent to pathology, by IR.      04/09/2016 Pathology Results    Lung, needle/core biopsy(ies), Right Upper Lobe - NON SMALL CELL CARCINOMA.      04/17/2016 Pathology Results    PDL1 High Expression.  TPS 50%.      04/24/2016 PET scan    1. Prominently hypermetabolic large right upper lobe mass with metastatic disease to the right paratracheal and right hilar nodal chains, to the descending mesocolon, to the left upper buttock subcutaneous tissues, to the left upper lobe, to the  right subscapularis muscle, and possibly to the left adrenal gland and right breast subcutaneous tissues. Appearance compatible with stage IV lung cancer. 2. There is some focal high activity in the ascending colon which is probably from peristalsis, less likely from a local colon mass. This does raise the possibility that the adjacent mesocolon tumor implant could be due to synchronous colon cancer rather than lung metastasis. 3. Other imaging findings of potential clinical significance: Coronary, aortic arch, and branch vessel atherosclerotic vascular disease. Aortoiliac atherosclerotic vascular disease. Deformity left proximal humerus and left glenohumeral joint from prior trauma. Enlarged prostate gland.      04/26/2016 Pathology Results    [facility]: Genomic alterations identified- KRAS S92Z, CREBBP splice site [id number], LRP1B S515*, QJF35 K56*, YBW38 splice site [id number], SPTA1 splice site [id number], TP53 C135Y.  Additional findings- MS-Stable, TMB-intermediate (14 Muts/Mb).  No reportable alterations- EGFR, ALK, BRAF, MET, ERBB2, RET, ROS1.      05/17/2016 -  Chemotherapy    The patient had ONDANSETRON IVPB [facility] +/- DEXAMETHASONE, , Intravenous,  Once, 0 of 1 cycle  pembrolizumab (KEYTRUDA) 200 mg in sodium chloride 0.9 % 50 mL chemo infusion, 200 mg, Intravenous, Once, 0 of 5 cycles  for chemotherapy treatment.         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  Two adjacent  large masslike areas of consolidation within the right upper lobe with differential considerations including malignancy or pneumonia. Multiple enlarged right hilar and mediastinal lymph nodes which may be reactive or metastatic in etiology.  Additional indeterminate pulmonary nodules as above. Recommend attention on follow-up.  Indeterminate 2.9 cm  left adrenal nodule. This needs dedicated evaluation with pre and post contrast-enhanced CT or MRI after confirmation of pulmonary process.  Indeterminate nodule within the right breast. Recommend dedicated evaluation with mammography.  Hepatic steatosis.      04/02/2016 Procedure    Ultrasound-guided core biopsy performed of a solid 1.5 cm soft tissue nodule in the right chest wall.      04/04/2016 Pathology Results    Diagnosis Soft Tissue Needle Core Biopsy, Right Chest Wall SMOOTH MUSCLE NEOPLASM Microscopic Comment The differential diagnosis include low grade leiomyosarcoma. The neoplasm shows spindle cell morphology with rare mitotic figures and minimal atypia, no necrosis present. These cells stains positive for smooth muscle actin, desmin, negative for cytokeratin AE1&3, ck8/18, s100, cd117, cd 34 and cd99. This case also reviewed by Dr. [name] and agree.         HISTORY OF PRESENT ILLNESS:  (From Kirby Crigler, PA-C's last note on 05/24/16)     INTERVAL HISTORY:  Mr. Feighner is here for follow-up and cycle 6 of [place]. Patient presents in a treatment chair. I personally reviewed and went over scans with the patient. Patient has been tolerating [place] well.   Patient states he had lost 10 pounds, but gained it back during the last visit. He has lost 3 pounds since his last visit.   He notes his rash is staying about the same as before but it itches. He was given cream for it but he notes it has not helped. He notes he is not itching right now, it does not happen all the time, but is constant for the first few days after treatment.  He reports his urine has a strong order after every cycle of treatment. Denies accompanying hematuria and dysuria.   He reports his appetite is unchanged. He reports mild leg swelling bilaterally.   REVIEW OF SYSTEMS:  Review of Systems  Constitutional: Negative.        Lost 3 pounds since last visit but otherwise stable.  HENT:   Negative.   Eyes: Negative.   Respiratory: Negative.   Cardiovascular: Positive for leg swelling (mild).  Gastrointestinal: Negative.   Endocrine: Negative.   Genitourinary: Negative.  Negative for dysuria and hematuria.   Musculoskeletal: Negative.   Skin: Positive for itching and rash (chest, abdomen, back, groin).  Neurological: Negative.   Hematological: Negative.   Psychiatric/Behavioral: Negative.   All other systems reviewed and are negative.   PAST MEDICAL/SURGICAL HISTORY:  Past Medical History:  Diagnosis Date  . Adrenal mass, left ([place]) 04/01/2016  . Diabetes mellitus ([place]) 05/02/2016  . Diabetes mellitus without complication ([place])   . Hypertension   . Mass of breast, right 04/01/2016  . Mass of upper lobe of right lung 04/01/2016   2 adjacent, 5 cm masses, hilar adenopathy  . Primary cancer of right upper lobe of lung ([place]) 04/01/2016   2 adjacent, 5 cm masses, hilar adenopathy  . Pulmonary embolus ([place]) 03/31/2016  . Spindle cell sarcoma [facility])    Past Surgical History:  Procedure Laterality Date  . PORTACATH PLACEMENT Left 05/13/2016   Procedure: INSERTION PORT-A-CATH;  Surgeon: Aviva Signs, MD;  Location: [facility];  Service: General;  Laterality: Left;  SOCIAL HISTORY:  Social History   Social History  . Marital status: Divorced    Spouse name: N/A  . Number of children: N/A  . Years of education: N/A   Occupational History  . Not on file.   Social History Main Topics  . Smoking status: Current Every Day Smoker    Years: 61.00    Types: E-cigarettes    Last attempt to quit: 04/11/2014  . Smokeless tobacco: Never Used     Comment: patient does vape smoking x 2 years  . Alcohol use No     Comment: quit 10 years  . Drug use: No  . Sexual activity: Not on file   Other Topics Concern  . Not on file   Social History Narrative  . No narrative on file    FAMILY HISTORY:  History reviewed. No pertinent family history.  CURRENT MEDICATIONS:   Outpatient Encounter Prescriptions as of 06/28/2016  Medication Sig Note  . ALPRAZolam (XANAX) 1 MG tablet Take 1 mg by mouth 3 (three) times daily as needed for anxiety.   Marland Kitchen amLODipine (NORVASC) 2.5 MG tablet Take 2.5 mg by mouth daily.   Marland Kitchen GLIPIZIDE XL 5 MG 24 hr tablet Take 5 mg by mouth daily.   . hydrocortisone cream 0.5 % Apply 1 application topically 2 (two) times daily.   Marland Kitchen lidocaine-prilocaine (EMLA) cream Apply a quarter size amount to affected area 1 hour prior to coming to chemotherapy. Cover with plastic wrap. 05/09/2016: Not started yet  . metFORMIN (GLUCOPHAGE) 500 MG tablet Take 500 mg by mouth 2 (two) times daily.   . metoprolol succinate (TOPROL-XL) 25 MG 24 hr tablet TAKE ONE TABLET BY MOUTH IN THE EVENING.   Marland Kitchen ondansetron (ZOFRAN) 8 MG tablet Take 8 mg by mouth every 8 (eight) hours as needed for nausea or vomiting.   Marland Kitchen oxyCODONE-acetaminophen (PERCOCET) 10-325 MG tablet Take 1 tablet by mouth every 4 (four) hours as needed for pain.   . Pembrolizumab (KEYTRUDA IV) Inject into the vein.   . predniSONE (DELTASONE) 20 MG tablet Take at the onset of diarrhea.  Take 5 tablets (100 mg) at one time.  Then call the [facility].   . prochlorperazine (COMPAZINE) 10 MG tablet  05/13/2016: Received from: External Pharmacy  . rivaroxaban (XARELTO) 20 MG TABS tablet Take 20 mg by mouth daily with supper. 05/09/2016: On hold due to upcoming procedure  . simvastatin (ZOCOR) 20 MG tablet Take 20 mg by mouth every evening.    No facility-administered encounter medications on file as of 06/28/2016.     ALLERGIES:  No Known Allergies   PHYSICAL EXAM:  ECOG Performance status: 1-2 - Symptomatic; requires occasional assistance     Physical Exam  Constitutional: He is oriented to person, place, and time and well-developed, well-nourished, and in no distress.  Physical exam was performed in a treatment chair.  HENT:  Head: Normocephalic and atraumatic.  Mouth/Throat: Oropharynx is clear  and moist. No oropharyngeal exudate.  Eyes: Conjunctivae and EOM are normal. Pupils are equal, round, and reactive to light. No scleral icterus.  Neck: Normal range of motion. Neck supple.  Cardiovascular: Normal rate, regular rhythm and normal heart sounds.   Pulmonary/Chest: Effort normal and breath sounds normal. No respiratory distress.  Abdominal: Soft. Bowel sounds are normal. There is no tenderness.  Musculoskeletal: Normal range of motion.  Lymphadenopathy:    He has no cervical adenopathy.       Right: No supraclavicular adenopathy present.  Left: No supraclavicular adenopathy present.  Neurological: He is alert and oriented to person, place, and time. No cranial nerve deficit. Gait normal.  Skin: Skin is warm and dry. No rash noted.  Psychiatric: Mood, memory, affect and judgment normal.  Nursing note and vitals reviewed.    LABORATORY DATA:  I have reviewed the labs as listed.  CBC    Component Value Date/Time   WBC 10.9 (H) 09/02/2016 0924   RBC 5.40 09/02/2016 0924   HGB 15.2 09/02/2016 0924   HGB 13.5 05/02/2016 0852   HCT 45.9 09/02/2016 0924   HCT 41.0 05/02/2016 0852   PLT 266 09/02/2016 0924   PLT 436 (H) 05/02/2016 0852   MCV 85.0 09/02/2016 0924   MCV 85.7 05/02/2016 0852   MCH 28.1 09/02/2016 0924   MCHC 33.1 09/02/2016 0924   RDW 16.4 (H) 09/02/2016 0924   RDW 13.9 05/02/2016 0852   LYMPHSABS 3.9 09/02/2016 0924   LYMPHSABS 3.4 (H) 05/02/2016 0852   MONOABS 0.8 09/02/2016 0924   MONOABS 1.2 (H) 05/02/2016 0852   EOSABS 0.5 09/02/2016 0924   EOSABS 1.6 (H) 05/02/2016 0852   BASOSABS 0.1 09/02/2016 0924   BASOSABS 0.2 (H) 05/02/2016 0852   CMP Latest Ref Rng & Units 09/02/2016 08/09/2016 07/19/2016  Glucose 65 - 99 mg/dL 103(H) 98 112(H)  BUN 6 - 20 mg/dL '10 12 12  ' Creatinine 0.61 - 1.24 mg/dL 0.92 0.92 0.99  Sodium 135 - 145 mmol/L 137 139 139  Potassium 3.5 - 5.1 mmol/L 3.7 3.8 3.6  Chloride 101 - 111 mmol/L 103 105 104  CO2 22 - 32 mmol/L  '26 27 26  ' Calcium 8.9 - 10.3 mg/dL 9.0 9.0 9.0  Total Protein 6.5 - 8.1 g/dL 7.3 7.0 7.1  Total Bilirubin 0.3 - 1.2 mg/dL 0.6 0.3 0.3  Alkaline Phos 38 - 126 U/L 75 83 71  AST 15 - 41 U/L '19 15 19  ' ALT 17 - 63 U/L 19 16(L) 19    PENDING LABS:    DIAGNOSTIC IMAGING:  Initial PET scan: 04/24/16    NUCLEAR MEDICINE PET SKULL BASE TO THIGH 08/06/2016  IMPRESSION: 1. Overall considerable improvement. The right upper lobe mass is markedly reduced in volume and SUV. Thoracic adenopathy have resolved and the hypermetabolic lesion in the right subscapularis muscle has also resolved. 2. The left upper lobe nodule is no longer appreciably hypermetabolic, and is highly indistinct although this may be due to motion artifact. Faint in indistinct nodularity elsewhere in the lungs likewise not currently hypermetabolic. 3. There is some very faint residual low-grade activity along the subcutaneous deposit along the upper left buttock region. Marked reduction in size and activity of the tumor deposit adjacent to the descending colon. 4. Prior right pleural effusion has resolved. 5. Stable appearance of the adrenal glands, with low-level activity and a nodule in the left adrenal gland. 6. Stable small nodule in the right breast, low-grade metabolic activity with maximum SUV 2.2 (formerly 3.0) 7. Other imaging findings of potential clinical significance: Severe arthropathy of the left glenohumeral joint. Coronary, aortic arch, and branch vessel atherosclerotic vascular disease. Aortoiliac atherosclerotic vascular disease. Prominent prostate gland indents the bladder base.   PATHOLOGY:  Right chest wall biopsy: 04/02/16   RUL Lung biopsy: 04/05/16    ASSESSMENT & PLAN:   Stage IV NSCLC with multiple sites of metastases:  - Reviewed last PET scan in detail with the patient and his brother today. He's had an excellent response after 4 cycles of  keytruda with decrease in his primary mass as well as  resolution of his previous chest lymphadenopathy. - Continue keytruda. Toelrating well except for persistent pruritic rash. Continue topical hydrocortisone cream. Also advised him to take benadryl or claritin to see if this will improve the pruritus, avoid hot showers. Keep skin moisturized. - Keytruda cycle 6 today. Labs reviewed.  Right chest wall leiomyosarcoma:  -stable on PET. Continue observation.  Dispo:  -Return to cancer center in 3 weeks for next cycle Keytruda.    All questions were answered to patient's stated satisfaction. Encouraged patient to call with any new concerns or questions before his next visit to the cancer center and we can certain see him sooner, if needed.     This document serves as a record of services personally performed by Twana First, MD. It was created on her behalf by Shirlean Mylar, a trained medical scribe. The creation of this record is based on the scribe's personal observations and the provider's statements to them. This document has been checked and approved by the attending provider.  I have reviewed the above documentation for accuracy and completeness and I agree with the above.

## 2016-09-22 NOTE — Assessment & Plan Note (Addendum)
Stage IV (T3N2M1B) adenocarcinoma of RUL, mediastinal lymphadenopathy, descending mesocolon (synchronous colon cancer?-not biopsied), subcutaneous (left upper buttock, right subscapularis muscle) and left adrenal metastases.  Stage IV disease is NOT biopsy proven.  PDL1-high expressor at 50%.  Pre-treatment labs today: CBC diff, CMET, TSH.  I personally reviewed and went over laboratory results with the patient.  The results are noted within this dictation.  Labs satisfy treatment parameters.   Nursing, in accordance with chemotherapy administration protocol, will monitor for acute side effects/toxicities associated with chemotherapy administration today.  I personally reviewed and went over radiographic studies with the patient.  The results are noted within this dictation.  I personally reviewed the images in PACS.  PET imaging in April 2018 demonstrates a significant response to therapy.  He has met with XRT in [place], but declined therapy at that time.  He does have B/L LE edema.  Low suspicion for DVT, he is on Xarelto and denies any missed doses.  Rx for Aldactone 50 mg and Lasix 20 mg is given.  Renal function is WNL.  He avoid Hydrocodone pain medication as he does not like the way it makes him feel.  He is interested in NSAID.  Rx for Naproxen is printed for the patient.  Return in 3 weeks for follow-up.

## 2016-09-22 NOTE — Progress Notes (Signed)
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 male returns for followup of Stage IV (T3N2M1B) adenocarcinoma of RUL, mediastinal lymphadenopathy, descending mesocolon (not biopsied- ?synchronous colon cancer?), subcutaneous (L upper buttock, R subscalpularis muscle), and L adrenal mets.  Stage IV NOT biopsy proven.  PDL1- high expressor at 50%. AND Low-grade leiomyosarcoma of R chest wall, biopsy proven on 04/02/2016 in setting of PET scan concerning for NSCLC metastatic disease. AND PE, RML, small central filling defect.  On Xarelto anticoagulation.    Primary cancer of right upper lobe of lung ([place])   05/14/2015 Procedure    Port placement by Dr. Arnoldo Morale      04/01/2016 Initial Diagnosis    Primary cancer of right upper lobe of lung ([place])     04/02/2016 Procedure    Ultrasound-guided core biopsy performed of a solid 1.5 cm soft tissue nodule in the right chest wall.      04/03/2016 Imaging    MRI brain- No acute and intracranial process or metastasis.  Moderate chronic small vessel ischemic disease.      04/04/2016 Pathology Results    Diagnosis Soft Tissue Needle Core Biopsy, Right Chest Wall SMOOTH MUSCLE NEOPLASM Microscopic Comment The differential diagnosis include low grade leiomyosarcoma. The neoplasm shows spindle cell morphology with rare mitotic figures and minimal atypia, no necrosis present. These cells stains positive for smooth muscle actin, desmin, negative for cytokeratin AE1&3, ck8/18, s100, cd117,  cd 34 and cd99. This case also reviewed by Dr. [name] and agree.      04/05/2016 Procedure    CT-guided biopsy of right upper lobe lesion, with tissue specimen sent to pathology, by IR.      04/09/2016 Pathology Results    Lung, needle/core biopsy(ies), Right Upper Lobe - NON SMALL CELL CARCINOMA.      04/17/2016 Pathology Results    PDL1 High Expression.  TPS 50%.      04/24/2016 PET scan    1. Prominently hypermetabolic large right upper lobe mass with metastatic disease to the right paratracheal and right hilar nodal chains, to the descending mesocolon, to the left upper buttock subcutaneous tissues, to the left upper lobe, to the right subscapularis muscle, and possibly to the left adrenal gland and right breast subcutaneous tissues. Appearance compatible with stage IV lung cancer. 2. There is some focal high activity in the ascending colon which is probably from peristalsis, less likely from a local colon mass. This does raise the possibility that the adjacent mesocolon tumor implant could be due to synchronous colon cancer rather than lung metastasis. 3. Other imaging findings of potential clinical significance: Coronary, aortic arch, and branch vessel atherosclerotic vascular disease. Aortoiliac atherosclerotic vascular disease. Deformity left proximal humerus and left glenohumeral joint from prior trauma. Enlarged prostate gland.      04/26/2016 Pathology Results    FoundationONE: Genomic alterations identified- KRAS X79T, CREBBP splice site 9030-0P>Q, LRP1B S515*, ZRA07 M22*, QJF35 splice site [id number]>DS, SPTA1 splice site 2876+8T>L, TP53 C135Y.  Additional findings- MS-Stable, TMB-intermediate (14 Muts/Mb).  No reportable alterations- EGFR, ALK, BRAF, MET, ERBB2, RET,  ROS1.      05/17/2016 -  Chemotherapy    The patient had ONDANSETRON IVPB [facility] +/- DEXAMETHASONE, , Intravenous,  Once, 0 of 1 cycle  pembrolizumab (KEYTRUDA) 200 mg in sodium chloride 0.9 % 50  mL chemo infusion, 200 mg, Intravenous, Once, 0 of 5 cycles  for chemotherapy treatment.        08/06/2016 PET scan    IMPRESSION: 1. Overall considerable improvement. The right upper lobe mass is markedly reduced in volume and SUV. Thoracic adenopathy have resolved and the hypermetabolic lesion in the right subscapularis muscle has also resolved. 2. The left upper lobe nodule is no longer appreciably hypermetabolic, and is highly indistinct although this may be due to motion artifact. Faint in indistinct nodularity elsewhere in the lungs likewise not currently hypermetabolic. 3. There is some very faint residual low-grade activity along the subcutaneous deposit along the upper left buttock region. Marked reduction in size and activity of the tumor deposit adjacent to the descending colon. 4. Prior right pleural effusion has resolved. 5. Stable appearance of the adrenal glands, with low-level activity and a nodule in the left adrenal gland. 6. Stable small nodule in the right breast, low-grade metabolic activity with maximum SUV 2.2 (formerly 3.0) 7. Other imaging findings of potential clinical significance: Severe arthropathy of the left glenohumeral joint. Coronary, aortic arch, and branch vessel atherosclerotic vascular disease. Aortoiliac atherosclerotic vascular disease. Prominent prostate gland indents the bladder base.        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  Two adjacent large masslike areas of consolidation within the right upper lobe with differential considerations including malignancy or pneumonia. Multiple enlarged right hilar and mediastinal lymph nodes which may be reactive or metastatic in etiology.  Additional indeterminate pulmonary nodules as above. Recommend attention on follow-up.  Indeterminate 2.9 cm left  adrenal nodule. This needs dedicated evaluation with pre and post contrast-enhanced CT or MRI after confirmation of pulmonary process.  Indeterminate nodule within the right breast. Recommend dedicated evaluation with mammography.  Hepatic steatosis.      04/02/2016 Procedure    Ultrasound-guided core biopsy performed of a solid 1.5 cm soft tissue nodule in the right chest wall.      04/04/2016 Pathology Results    Diagnosis Soft Tissue Needle Core Biopsy, Right Chest Wall SMOOTH MUSCLE NEOPLASM Microscopic Comment The differential diagnosis include low grade leiomyosarcoma. The neoplasm shows spindle cell morphology with rare mitotic figures and minimal atypia, no necrosis present. These cells stains positive for smooth muscle actin, desmin, negative for cytokeratin AE1&3, ck8/18, s100, cd117, cd 34 and cd99. This case also reviewed by Dr. [name] and agree.          HPI Elements NSCLC  Location: RUL  Quality: Adenocarcinoma  Severity: Stage IV  Duration: Dx 04/09/2016  Context: Metastatic disease-not biopsy proven.  Right chest wall mass biopsy proven to be low-grade leiomyosarcoma  Timing:   Modifying Factors: Tobacco abuse  Associated Signs & Symptoms:    He reports bilateral lower extremity edema.  He reports that this is been an ongoing issue for years.  I reviewed his antihypertensives.  I do not see any diuretics.  I will give him a short course of diuretics.  On exam, the left lower leg is more edematous than the right.  No heat, tenderness, or erythema noted.  He was chopping/loading wood recently  and noted a back pain.  He wanted to review his scans in person.  I reviewed his initial diagnosis scan in addition to the most recent restaging scan.    He reports a nonproductive cough.  Review of Systems  Constitutional: Negative.  Negative for chills, fever and weight loss.  HENT: Negative.   Eyes: Negative.   Respiratory: Positive for cough. Negative for  hemoptysis, sputum production and shortness of breath.   Cardiovascular: Positive for leg swelling. Negative for chest pain.  Gastrointestinal: Negative.  Negative for blood in stool, constipation, diarrhea, melena, nausea and vomiting.  Genitourinary: Negative.   Musculoskeletal: Positive for back pain.  Skin: Negative.   Neurological: Negative.  Negative for weakness.  Endo/Heme/Allergies: Negative.   Psychiatric/Behavioral: Negative.     Past Medical History:  Diagnosis Date  . Adrenal mass, left ([place]) 04/01/2016  . Diabetes mellitus ([place]) 05/02/2016  . Diabetes mellitus without complication ([place])   . Hypertension   . Mass of breast, right 04/01/2016  . Mass of upper lobe of right lung 04/01/2016   2 adjacent, 5 cm masses, hilar adenopathy  . Primary cancer of right upper lobe of lung ([place]) 04/01/2016   2 adjacent, 5 cm masses, hilar adenopathy  . Pulmonary embolus ([place]) 03/31/2016  . Spindle cell sarcoma [facility])     Past Surgical History:  Procedure Laterality Date  . PORTACATH PLACEMENT Left 05/13/2016   Procedure: INSERTION PORT-A-CATH;  Surgeon: Aviva Signs, MD;  Location: [facility];  Service: General;  Laterality: Left;    No family history on file.  Social History   Social History  . Marital status: Divorced    Spouse name: N/A  . Number of children: N/A  . Years of education: N/A   Social History Main Topics  . Smoking status: Current Every Day Smoker    Years: 61.00    Types: E-cigarettes    Last attempt to quit: 04/11/2014  . Smokeless tobacco: Never Used     Comment: patient does vape smoking x 2 years  . Alcohol use No     Comment: quit 10 years  . Drug use: No  . Sexual activity: Not on file   Other Topics Concern  . Not on file   Social History Narrative  . No narrative on file     PHYSICAL EXAMINATION  ECOG PERFORMANCE STATUS: 1 - Symptomatic but completely ambulatory  Vitals:   09/23/16 0831  BP: (!) 157/84  Pulse: 79  Resp: 18  Temp:  98 F (36.7 C)    GENERAL:alert, no distress, well nourished, well developed, comfortable, cooperative, obese, smiling and accompanied by daughter, in chemo-recliner. SKIN: skin color, texture, turgor are normal, no rashes or significant lesions HEAD: Normocephalic, No masses, lesions, tenderness or abnormalities EYES: normal, EOMI EARS: External ears normal OROPHARYNX:lips, buccal mucosa, and tongue normal and mucous membranes are moist  NECK: supple, no adenopathy, trachea midline LYMPH:  no palpable lymphadenopathy BREAST:not examined LUNGS: clear to auscultation , decreased breath sounds HEART: regular rate & rhythm, no murmurs and no gallops ABDOMEN:abdomen soft, obese and normal bowel sounds BACK: Back symmetric, no curvature. EXTREMITIES:less then 2 second capillary refill, no joint deformities, effusion, or inflammation, no skin discoloration, no cyanosis, positive findings:  edema B/L LE edema, L>R, 2+ pitting.  NO erythema, heat, or pain on deep palpation.  No palpable cords.  NEURO: alert & oriented x 3 with fluent speech, no focal motor/sensory deficits, gait normal   LABORATORY DATA: CBC    Component  Value Date/Time   WBC 13.0 (H) 09/23/2016 0834   RBC 5.11 09/23/2016 0834   HGB 14.7 09/23/2016 0834   HGB 13.5 05/02/2016 0852   HCT 43.6 09/23/2016 0834   HCT 41.0 05/02/2016 0852   PLT 262 09/23/2016 0834   PLT 436 (H) 05/02/2016 0852   MCV 85.3 09/23/2016 0834   MCV 85.7 05/02/2016 0852   MCH 28.8 09/23/2016 0834   MCHC 33.7 09/23/2016 0834   RDW 15.4 09/23/2016 0834   RDW 13.9 05/02/2016 0852   LYMPHSABS 3.5 09/23/2016 0834   LYMPHSABS 3.4 (H) 05/02/2016 0852   MONOABS 1.4 (H) 09/23/2016 0834   MONOABS 1.2 (H) 05/02/2016 0852   EOSABS 0.4 09/23/2016 0834   EOSABS 1.6 (H) 05/02/2016 0852   BASOSABS 0.0 09/23/2016 0834   BASOSABS 0.2 (H) 05/02/2016 0852      Chemistry      Component Value Date/Time   NA 138 09/23/2016 0834   NA 139 05/02/2016 0852   K  3.9 09/23/2016 0834   K 4.2 05/02/2016 0852   CL 104 09/23/2016 0834   CO2 26 09/23/2016 0834   CO2 24 05/02/2016 0852   BUN 16 09/23/2016 0834   BUN 10.6 05/02/2016 0852   CREATININE 1.02 09/23/2016 0834   CREATININE 1.0 05/02/2016 0852      Component Value Date/Time   CALCIUM 8.9 09/23/2016 0834   CALCIUM 9.2 05/02/2016 0852   ALKPHOS 74 09/23/2016 0834   ALKPHOS 73 05/02/2016 0852   AST 16 09/23/2016 0834   AST 12 05/02/2016 0852   ALT 15 (L) 09/23/2016 0834   ALT 14 05/02/2016 0852   BILITOT 0.7 09/23/2016 0834   BILITOT 0.42 05/02/2016 0852      Lab Results  Component Value Date   TSH 2.612 09/02/2016     PENDING LABS:   RADIOGRAPHIC STUDIES:  No results found.   PATHOLOGY:    ASSESSMENT AND PLAN:  Primary cancer of right upper lobe of lung (HCC) Stage IV (T3N2M1B) adenocarcinoma of RUL, mediastinal lymphadenopathy, descending mesocolon (synchronous colon cancer?-not biopsied), subcutaneous (left upper buttock, right subscapularis muscle) and left adrenal metastases.  Stage IV disease is NOT biopsy proven.  PDL1-high expressor at 50%.  Pre-treatment labs today: CBC diff, CMET, TSH.  I personally reviewed and went over laboratory results with the patient.  The results are noted within this dictation.  Labs satisfy treatment parameters.   Nursing, in accordance with chemotherapy administration protocol, will monitor for acute side effects/toxicities associated with chemotherapy administration today.  I personally reviewed and went over radiographic studies with the patient.  The results are noted within this dictation.  I personally reviewed the images in PACS.  PET imaging in April 2018 demonstrates a significant response to therapy.  He has met with XRT in [place], but declined therapy at that time.  He does have B/L LE edema.  Low suspicion for DVT, he is on Xarelto and denies any missed doses.  Rx for Aldactone 50 mg and Lasix 20 mg is given.  Renal function is  WNL.  He avoid Hydrocodone pain medication as he does not like the way it makes him feel.  He is interested in NSAID.  Rx for Naproxen is printed for the patient.  Return in 3 weeks for follow-up.  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  Need to consider surgical resection in future if restaging scans demonstrate  progression of disease.  Currently stable based upon most recent PET imaging.    Pulmonary embolus (HCC) PE, RML, small central filling defect.  On Xarelto anticoagulation.   ORDERS PLACED FOR THIS ENCOUNTER: Orders Placed This Encounter  Procedures  . CBC with Differential  . Comprehensive metabolic panel  . TSH    MEDICATIONS PRESCRIBED THIS ENCOUNTER: Meds ordered this encounter  Medications  . furosemide (LASIX) 20 MG tablet    Sig: Take 1 tablet (20 mg total) by mouth daily.    Dispense:  10 tablet    Refill:  0    Order Specific Question:   Supervising Provider    Answer:   Brunetta Genera [[id number]]  . spironolactone (ALDACTONE) 50 MG tablet    Sig: Take 1 tablet (50 mg total) by mouth daily.    Dispense:  10 tablet    Refill:  0    Order Specific Question:   Supervising Provider    Answer:   Brunetta Genera [[id number]]  . naproxen (NAPROSYN) 500 MG tablet    Sig: Take 1 tablet (500 mg total) by mouth 2 (two) times daily with a meal.    Dispense:  30 tablet    Refill:  1    Order Specific Question:   Supervising Provider    Answer:   Brunetta Genera [[id number]]    THERAPY PLAN:  Continue Keytruda.    All questions were answered. The patient knows to call the clinic with any problems, questions or concerns. We can certainly see the patient much sooner if necessary.  Patient and plan discussed with Dr. Twana First and she is in agreement with the aforementioned.   This note is electronically signed by:  Doy Mince 09/23/2016 9:56 AM

## 2016-09-22 NOTE — Assessment & Plan Note (Signed)
PE, RML, small central filling defect.  On Xarelto anticoagulation.

## 2016-09-22 NOTE — Assessment & Plan Note (Addendum)
Low-grade leiomyosarcoma of right chest wall, biopsy proven on 04/02/2016 in setting of PET scan concerning for NSCLC metastatic disease.  Need to consider surgical resection in future if restaging scans demonstrate progression of disease.  Currently stable based upon most recent PET imaging.

## 2016-09-23 ENCOUNTER — Encounter (HOSPITAL_COMMUNITY): Payer: Self-pay

## 2016-09-23 ENCOUNTER — Encounter (HOSPITAL_COMMUNITY): Payer: Medicare Other | Attending: Oncology

## 2016-09-23 ENCOUNTER — Encounter (HOSPITAL_BASED_OUTPATIENT_CLINIC_OR_DEPARTMENT_OTHER): Payer: Medicare Other | Admitting: Oncology

## 2016-09-23 VITALS — BP 141/81 | HR 67 | Resp 18

## 2016-09-23 VITALS — BP 157/84 | HR 79 | Temp 98.0°F | Resp 18 | Wt 227.0 lb

## 2016-09-23 DIAGNOSIS — C493 Malignant neoplasm of connective and soft tissue of thorax: Secondary | ICD-10-CM | POA: Diagnosis not present

## 2016-09-23 DIAGNOSIS — Z5112 Encounter for antineoplastic immunotherapy: Secondary | ICD-10-CM

## 2016-09-23 DIAGNOSIS — R609 Edema, unspecified: Secondary | ICD-10-CM

## 2016-09-23 DIAGNOSIS — Z7901 Long term (current) use of anticoagulants: Secondary | ICD-10-CM

## 2016-09-23 DIAGNOSIS — C499 Malignant neoplasm of connective and soft tissue, unspecified: Secondary | ICD-10-CM

## 2016-09-23 DIAGNOSIS — I2699 Other pulmonary embolism without acute cor pulmonale: Secondary | ICD-10-CM

## 2016-09-23 DIAGNOSIS — C3411 Malignant neoplasm of upper lobe, right bronchus or lung: Secondary | ICD-10-CM | POA: Diagnosis not present

## 2016-09-23 DIAGNOSIS — I2692 Saddle embolus of pulmonary artery without acute cor pulmonale: Secondary | ICD-10-CM

## 2016-09-23 LAB — CBC WITH DIFFERENTIAL/PLATELET
Basophils Absolute: 0 10*3/uL (ref 0.0–0.1)
Basophils Relative: 0 %
Eosinophils Absolute: 0.4 10*3/uL (ref 0.0–0.7)
Eosinophils Relative: 3 %
HCT: 43.6 % (ref 39.0–52.0)
Hemoglobin: 14.7 g/dL (ref 13.0–17.0)
Lymphocytes Relative: 27 %
Lymphs Abs: 3.5 10*3/uL (ref 0.7–4.0)
MCH: 28.8 pg (ref 26.0–34.0)
MCHC: 33.7 g/dL (ref 30.0–36.0)
MCV: 85.3 fL (ref 78.0–100.0)
Monocytes Absolute: 1.4 10*3/uL — ABNORMAL HIGH (ref 0.1–1.0)
Monocytes Relative: 11 %
Neutro Abs: 7.8 10*3/uL — ABNORMAL HIGH (ref 1.7–7.7)
Neutrophils Relative %: 59 %
Platelets: 262 10*3/uL (ref 150–400)
RBC: 5.11 MIL/uL (ref 4.22–5.81)
RDW: 15.4 % (ref 11.5–15.5)
WBC: 13 10*3/uL — ABNORMAL HIGH (ref 4.0–10.5)

## 2016-09-23 LAB — COMPREHENSIVE METABOLIC PANEL
ALT: 15 U/L — ABNORMAL LOW (ref 17–63)
AST: 16 U/L (ref 15–41)
Albumin: 4 g/dL (ref 3.5–5.0)
Alkaline Phosphatase: 74 U/L (ref 38–126)
Anion gap: 8 (ref 5–15)
BUN: 16 mg/dL (ref 6–20)
CO2: 26 mmol/L (ref 22–32)
Calcium: 8.9 mg/dL (ref 8.9–10.3)
Chloride: 104 mmol/L (ref 101–111)
Creatinine, Ser: 1.02 mg/dL (ref 0.61–1.24)
GFR calc Af Amer: >60 mL/min (ref >60)
GFR calc non Af Amer: >60 mL/min (ref >60)
Glucose, Bld: 104 mg/dL — ABNORMAL HIGH (ref 65–99)
Potassium: 3.9 mmol/L (ref 3.5–5.1)
Sodium: 138 mmol/L (ref 135–145)
Total Bilirubin: 0.7 mg/dL (ref 0.3–1.2)
Total Protein: 7.1 g/dL (ref 6.5–8.1)

## 2016-09-23 LAB — TSH: TSH: 2.946 u[IU]/mL (ref 0.350–4.500)

## 2016-09-23 MED ORDER — FUROSEMIDE 20 MG PO TABS: 20.0000 mg | ORAL_TABLET | Freq: Every day | ORAL | 0 refills | Status: DC

## 2016-09-23 MED ORDER — PEMBROLIZUMAB CHEMO INJECTION 100 MG/4ML
200.0000 mg | Freq: Once | INTRAVENOUS | Status: AC
  Administered 2016-09-23: 200 mg via INTRAVENOUS
  Filled 2016-09-23: qty 8

## 2016-09-23 MED ORDER — SODIUM CHLORIDE 0.9 % IV SOLN
Freq: Once | INTRAVENOUS | Status: AC
  Administered 2016-09-23: 10:00:00 via INTRAVENOUS

## 2016-09-23 MED ORDER — SODIUM CHLORIDE 0.9% FLUSH
10.0000 mL | INTRAVENOUS | Status: DC | PRN
  Administered 2016-09-23: 10 mL
  Filled 2016-09-23: qty 10

## 2016-09-23 MED ORDER — SPIRONOLACTONE 50 MG PO TABS: 50.0000 mg | ORAL_TABLET | Freq: Every day | ORAL | 0 refills | Status: DC

## 2016-09-23 MED ORDER — NAPROXEN 500 MG PO TABS: 500.0000 mg | ORAL_TABLET | Freq: Two times a day (BID) | ORAL | 1 refills | Status: DC

## 2016-09-23 MED ORDER — HEPARIN SOD (PORK) LOCK FLUSH 100 UNIT/ML IV SOLN
500.0000 [IU] | Freq: Once | INTRAVENOUS | Status: AC | PRN
  Administered 2016-09-23: 500 [IU]
  Filled 2016-09-23: qty 5

## 2016-09-23 NOTE — Progress Notes (Signed)
Labs reviewed with MD, proceed with Keytruda.  Chemotherapy given today per orders. Patient tolerated it well. No issues. Follow up as scheduled.

## 2016-09-23 NOTE — Patient Instructions (Signed)
[facility] Discharge Instructions for Patients Receiving Chemotherapy   Beginning January 23rd 2017 lab work for the Cancer Center will be done in the  Main lab at [facility] on 1st floor. If you have a lab appointment with the Cancer Center please come in thru the  Main Entrance and check in at the main information desk   Today you received the following chemotherapy agents   To help prevent nausea and vomiting after your treatment, we encourage you to take your nausea medication     If you develop nausea and vomiting, or diarrhea that is not controlled by your medication, call the clinic.  The clinic phone number is [phone]. Office hours are Monday-Friday 8:30am-5:00pm.  BELOW ARE SYMPTOMS THAT SHOULD BE REPORTED IMMEDIATELY:  *FEVER GREATER THAN 101.0 F  *CHILLS WITH OR WITHOUT FEVER  NAUSEA AND VOMITING THAT IS NOT CONTROLLED WITH YOUR NAUSEA MEDICATION  *UNUSUAL SHORTNESS OF BREATH  *UNUSUAL BRUISING OR BLEEDING  TENDERNESS IN MOUTH AND THROAT WITH OR WITHOUT PRESENCE OF ULCERS  *URINARY PROBLEMS  *BOWEL PROBLEMS  UNUSUAL RASH Items with * indicate a potential emergency and should be followed up as soon as possible. If you have an emergency after office hours please contact your primary care physician or go to the nearest emergency department.  Please call the clinic during office hours if you have any questions or concerns.   You may also contact the Patient Navigator at [phone] should you have any questions or need assistance in obtaining follow up care.      Resources For Cancer Patients and their Caregivers ? American Cancer Society: Can assist with transportation, wigs, general needs, runs Look Good Feel Better.        1-888-227-6333 ? Cancer Care: Provides financial assistance, online support groups, medication/co-pay assistance.  1-800-813-HOPE (4673) ? [facility] Assists [place] cancer  patients and their families through emotional , educational and financial support.  336-427-4357 ? [place] DSS Where to apply for food stamps, Medicaid and utility assistance. 336-342-1394 ? [facility]: Transportation to medical appointments. 336-347-2287 ? Social Security Administration: May apply for disability if have a Stage IV cancer. 336-342-7796 1-800-772-1213 ? [place] Aging, Disability and Transit Services: Assists with nutrition, care and transit needs. 336-349-2343         

## 2016-09-23 NOTE — Patient Instructions (Signed)
[facility] Discharge Instructions  RECOMMENDATIONS MADE BY THE CONSULTANT AND ANY TEST RESULTS WILL BE SENT TO YOUR REFERRING PHYSICIAN.  You were seen today by Kirby Crigler PA-C. Treatment today. Refills given today for Aldactone, Lasix and Naproxen. Make sure that your are elevating your legs! Return in 3 weeks for treatment and follow up.   Thank you for choosing [facility] to provide your oncology and hematology care.  To afford each patient quality time with our provider, please arrive at least 15 minutes before your scheduled appointment time.    If you have a lab appointment with the [facility] please come in thru the  Main Entrance and check in at the main information desk  You need to re-schedule your appointment should you arrive 10 or more minutes late.  We strive to give you quality time with our providers, and arriving late affects you and other patients whose appointments are after yours.  Also, if you no show three or more times for appointments you may be dismissed from the clinic at the providers discretion.     Again, thank you for choosing [facility].  Our hope is that these requests will decrease the amount of time that you wait before being seen by our physicians.       _____________________________________________________________  Should you have questions after your visit to [facility], please contact our office at [phone] between the hours of 8:30 a.m. and 4:30 p.m.  Voicemails left after 4:30 p.m. will not be returned until the following business day.  For prescription refill requests, have your pharmacy contact our office.       Resources For Cancer Patients and their Caregivers ? American Cancer Society: Can assist with transportation, wigs, general needs, runs Look Good Feel Better.        636-072-8165 ? Cancer Care: Provides financial  assistance, online support groups, medication/co-pay assistance.  1-800-813-HOPE 3397613649) ? [facility] Assists [place] cancer patients and their families through emotional , educational and financial support.  (951)560-8267 ? [place] DSS Where to apply for food stamps, Medicaid and utility assistance. 301-421-9326 ? RCATS: Transportation to medical appointments. 431-858-3971 ? Social Security Administration: May apply for disability if have a Stage IV cancer. 551-395-2686 (548)188-4235 ? [employer], Disability and Transit Services: Assists with nutrition, care and transit needs. [facility] Support Programs: '@10RELATIVEDAYS'$ @ > Cancer Support Group  2nd Tuesday of the month 1pm-2pm, Journey Room  > Creative Journey  3rd Tuesday of the month 1130am-1pm, Journey Room  > Look Good Feel Better  1st Wednesday of the month 10am-12 noon, Journey Room (Call [place] to register [phone])

## 2016-09-24 ENCOUNTER — Other Ambulatory Visit (HOSPITAL_COMMUNITY): Payer: Self-pay | Admitting: Oncology

## 2016-09-24 DIAGNOSIS — C3411 Malignant neoplasm of upper lobe, right bronchus or lung: Secondary | ICD-10-CM

## 2016-10-01 DIAGNOSIS — Z125 Encounter for screening for malignant neoplasm of prostate: Secondary | ICD-10-CM | POA: Diagnosis not present

## 2016-10-01 DIAGNOSIS — E1165 Type 2 diabetes mellitus with hyperglycemia: Secondary | ICD-10-CM | POA: Diagnosis not present

## 2016-10-03 DIAGNOSIS — E782 Mixed hyperlipidemia: Secondary | ICD-10-CM | POA: Diagnosis not present

## 2016-10-03 DIAGNOSIS — C3411 Malignant neoplasm of upper lobe, right bronchus or lung: Secondary | ICD-10-CM | POA: Diagnosis not present

## 2016-10-03 DIAGNOSIS — F411 Generalized anxiety disorder: Secondary | ICD-10-CM | POA: Diagnosis not present

## 2016-10-03 DIAGNOSIS — E1165 Type 2 diabetes mellitus with hyperglycemia: Secondary | ICD-10-CM | POA: Diagnosis not present

## 2016-10-10 ENCOUNTER — Other Ambulatory Visit (HOSPITAL_COMMUNITY): Payer: Self-pay | Admitting: *Deleted

## 2016-10-10 DIAGNOSIS — C3411 Malignant neoplasm of upper lobe, right bronchus or lung: Secondary | ICD-10-CM

## 2016-10-10 DIAGNOSIS — L299 Pruritus, unspecified: Secondary | ICD-10-CM | POA: Insufficient documentation

## 2016-10-10 MED ORDER — HYDROXYZINE HCL 25 MG PO TABS: 25.0000 mg | ORAL_TABLET | Freq: Three times a day (TID) | ORAL | 1 refills | Status: DC | PRN

## 2016-10-14 ENCOUNTER — Encounter (HOSPITAL_BASED_OUTPATIENT_CLINIC_OR_DEPARTMENT_OTHER): Payer: Medicare Other | Admitting: Oncology

## 2016-10-14 ENCOUNTER — Encounter (HOSPITAL_COMMUNITY): Payer: Self-pay | Admitting: Oncology

## 2016-10-14 ENCOUNTER — Encounter (HOSPITAL_COMMUNITY): Payer: Medicare Other | Attending: Oncology

## 2016-10-14 VITALS — BP 125/75 | HR 60 | Temp 97.3°F | Resp 18 | Wt 225.6 lb

## 2016-10-14 DIAGNOSIS — C3411 Malignant neoplasm of upper lobe, right bronchus or lung: Secondary | ICD-10-CM | POA: Diagnosis not present

## 2016-10-14 DIAGNOSIS — Z5112 Encounter for antineoplastic immunotherapy: Secondary | ICD-10-CM

## 2016-10-14 DIAGNOSIS — L299 Pruritus, unspecified: Secondary | ICD-10-CM

## 2016-10-14 LAB — COMPREHENSIVE METABOLIC PANEL
ALT: 15 U/L — ABNORMAL LOW (ref 17–63)
AST: 16 U/L (ref 15–41)
Albumin: 4 g/dL (ref 3.5–5.0)
Alkaline Phosphatase: 75 U/L (ref 38–126)
Anion gap: 10 (ref 5–15)
BUN: 12 mg/dL (ref 6–20)
CO2: 26 mmol/L (ref 22–32)
Calcium: 9 mg/dL (ref 8.9–10.3)
Chloride: 103 mmol/L (ref 101–111)
Creatinine, Ser: 1.1 mg/dL (ref 0.61–1.24)
GFR calc Af Amer: >60 mL/min (ref >60)
GFR calc non Af Amer: >60 mL/min (ref >60)
Glucose, Bld: 105 mg/dL — ABNORMAL HIGH (ref 65–99)
Potassium: 3.7 mmol/L (ref 3.5–5.1)
Sodium: 139 mmol/L (ref 135–145)
Total Bilirubin: 0.6 mg/dL (ref 0.3–1.2)
Total Protein: 7 g/dL (ref 6.5–8.1)

## 2016-10-14 LAB — CBC WITH DIFFERENTIAL/PLATELET
Basophils Absolute: 0.1 10*3/uL (ref 0.0–0.1)
Basophils Relative: 1 %
Eosinophils Absolute: 0.4 10*3/uL (ref 0.0–0.7)
Eosinophils Relative: 4 %
HCT: 43.7 % (ref 39.0–52.0)
Hemoglobin: 14.5 g/dL (ref 13.0–17.0)
Lymphocytes Relative: 41 %
Lymphs Abs: 4 10*3/uL (ref 0.7–4.0)
MCH: 28.4 pg (ref 26.0–34.0)
MCHC: 33.2 g/dL (ref 30.0–36.0)
MCV: 85.7 fL (ref 78.0–100.0)
Monocytes Absolute: 0.9 10*3/uL (ref 0.1–1.0)
Monocytes Relative: 9 %
Neutro Abs: 4.5 10*3/uL (ref 1.7–7.7)
Neutrophils Relative %: 45 %
Platelets: 255 10*3/uL (ref 150–400)
RBC: 5.1 MIL/uL (ref 4.22–5.81)
RDW: 14.8 % (ref 11.5–15.5)
WBC: 9.8 10*3/uL (ref 4.0–10.5)

## 2016-10-14 LAB — TSH: TSH: 2.35 u[IU]/mL (ref 0.350–4.500)

## 2016-10-14 MED ORDER — SODIUM CHLORIDE 0.9 % IV SOLN
Freq: Once | INTRAVENOUS | Status: AC
Start: 2016-10-14 — End: 2016-10-14
  Administered 2016-10-14: 10:00:00 via INTRAVENOUS

## 2016-10-14 MED ORDER — HEPARIN SOD (PORK) LOCK FLUSH 100 UNIT/ML IV SOLN
500.0000 [IU] | Freq: Once | INTRAVENOUS | Status: AC | PRN
  Administered 2016-10-14: 500 [IU]
  Filled 2016-10-14: qty 5

## 2016-10-14 MED ORDER — SODIUM CHLORIDE 0.9 % IV SOLN
200.0000 mg | Freq: Once | INTRAVENOUS | Status: AC
  Administered 2016-10-14: 200 mg via INTRAVENOUS
  Filled 2016-10-14: qty 8

## 2016-10-14 MED ORDER — HYDROXYZINE HCL 25 MG PO TABS: 25.0000 mg | ORAL_TABLET | Freq: Three times a day (TID) | ORAL | 5 refills | Status: DC | PRN

## 2016-10-14 NOTE — Progress Notes (Signed)
Darrell Christensen, [name] [id number]  No diagnosis found.  CURRENT THERAPY: Keytruda beginning on 05/17/2016 and Xarelto anticoagulation  INTERVAL HISTORY: Darrell Christensen 71 y.o. male returns for followup of Stage IV (T3N2M1B) adenocarcinoma of RUL, mediastinal lymphadenopathy, descending mesocolon (not biopsied- ?synchronous colon cancer?), subcutaneous (L upper buttock, R subscalpularis muscle), and L adrenal mets.  Stage IV NOT biopsy proven.  PDL1- high expressor at 50%. AND Low-grade leiomyosarcoma of R chest wall, biopsy proven on 04/02/2016 in setting of PET scan concerning for NSCLC metastatic disease. AND PE, RML, small central filling defect.  On Xarelto anticoagulation.  Patient is here for ongoing evaluation and continuation of treatment.  Rash continues to bother him  Dear for continuatif therapy last PET scan was done in April of 2018 ACE inhibitor therapy was not prescribed due to medical contraindication. Diarrhea.  Appetite has been stable.    Primary cancer of right upper lobe of lung (Darrell Christensen)   05/14/2015 Procedure    Port placement by Dr. Arnoldo Morale      04/01/2016 Initial Diagnosis    Primary cancer of right upper lobe of lung ([place])     04/02/2016 Procedure    Ultrasound-guided core biopsy performed of a solid 1.5 cm soft tissue nodule in the right chest wall.      04/03/2016 Imaging    MRI brain- No acute and intracranial process or metastasis.  Moderate chronic small vessel ischemic disease.      04/04/2016 Pathology Results    Diagnosis Soft Tissue Needle Core Biopsy, Right Chest Wall SMOOTH MUSCLE NEOPLASM Microscopic Comment The differential diagnosis include low grade leiomyosarcoma. The neoplasm shows spindle cell morphology with rare mitotic figures and minimal atypia, no necrosis present. These cells stains positive for smooth muscle actin, desmin, negative for cytokeratin AE1&3, ck8/18, s100, cd117, cd 34  and cd99. This case also reviewed by Dr. [name] and agree.      04/05/2016 Procedure    CT-guided biopsy of right upper lobe lesion, with tissue specimen sent to pathology, by IR.      04/09/2016 Pathology Results    Lung, needle/core biopsy(ies), Right Upper Lobe - NON SMALL CELL CARCINOMA.      04/17/2016 Pathology Results    PDL1 High Expression.  TPS 50%.      04/24/2016 PET scan    1. Prominently hypermetabolic large right upper lobe mass with metastatic disease to the right paratracheal and right hilar nodal chains, to the descending mesocolon, to the left upper buttock subcutaneous tissues, to the left upper lobe, to the right subscapularis muscle, and possibly to the left adrenal gland and right breast subcutaneous tissues. Appearance compatible with stage IV lung cancer. 2. There is some focal high activity in the ascending colon which is probably from peristalsis, less likely from a local colon mass. This does raise the possibility that the adjacent mesocolon tumor implant could be due to synchronous colon cancer rather than lung metastasis. 3. Other imaging findings of potential clinical significance: Coronary, aortic arch, and branch vessel atherosclerotic vascular disease. Aortoiliac atherosclerotic vascular disease. Deformity left proximal humerus and left glenohumeral joint from prior trauma. Enlarged prostate gland.      04/26/2016 Pathology Results    FoundationONE: Genomic alterations identified- KRAS V37T, CREBBP splice site [id number], LRP1B S515*, NIO27 O35*, KKX38 splice site [id number], SPTA1 splice site [id number], TP53 C135Y.  Additional findings- MS-Stable, TMB-intermediate (14 Muts/Mb).  No reportable alterations- EGFR, ALK, BRAF, MET,  ERBB2, RET, ROS1.      05/17/2016 -  Chemotherapy    The patient had ONDANSETRON IVPB [facility] +/- DEXAMETHASONE, , Intravenous,  Once, 0 of 1 cycle  pembrolizumab (KEYTRUDA) 200 mg in sodium chloride 0.9 % 50 mL  chemo infusion, 200 mg, Intravenous, Once, 0 of 5 cycles  for chemotherapy treatment.        08/06/2016 PET scan    IMPRESSION: 1. Overall considerable improvement. The right upper lobe mass is markedly reduced in volume and SUV. Thoracic adenopathy have resolved and the hypermetabolic lesion in the right subscapularis muscle has also resolved. 2. The left upper lobe nodule is no longer appreciably hypermetabolic, and is highly indistinct although this may be due to motion artifact. Faint in indistinct nodularity elsewhere in the lungs likewise not currently hypermetabolic. 3. There is some very faint residual low-grade activity along the subcutaneous deposit along the upper left buttock region. Marked reduction in size and activity of the tumor deposit adjacent to the descending colon. 4. Prior right pleural effusion has resolved. 5. Stable appearance of the adrenal glands, with low-level activity and a nodule in the left adrenal gland. 6. Stable small nodule in the right breast, low-grade metabolic activity with maximum SUV 2.2 (formerly 3.0) 7. Other imaging findings of potential clinical significance: Severe arthropathy of the left glenohumeral joint. Coronary, aortic arch, and branch vessel atherosclerotic vascular disease. Aortoiliac atherosclerotic vascular disease. Prominent prostate gland indents the bladder base.        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  Two adjacent large masslike areas of consolidation within the right upper lobe with differential considerations including malignancy or pneumonia. Multiple enlarged right hilar and mediastinal lymph nodes which may be reactive or metastatic in etiology.  Additional indeterminate pulmonary nodules as above. Recommend attention on follow-up.  Indeterminate 2.9 cm left  adrenal nodule. This needs dedicated evaluation with pre and post contrast-enhanced CT or MRI after confirmation of pulmonary process.  Indeterminate nodule within the right breast. Recommend dedicated evaluation with mammography.  Hepatic steatosis.      04/02/2016 Procedure    Ultrasound-guided core biopsy performed of a solid 1.5 cm soft tissue nodule in the right chest wall.      04/04/2016 Pathology Results    Diagnosis Soft Tissue Needle Core Biopsy, Right Chest Wall SMOOTH MUSCLE NEOPLASM Microscopic Comment The differential diagnosis include low grade leiomyosarcoma. The neoplasm shows spindle cell morphology with rare mitotic figures and minimal atypia, no necrosis present. These cells stains positive for smooth muscle actin, desmin, negative for cytokeratin AE1&3, ck8/18, s100, cd117, cd 34 and cd99. This case also reviewed by Dr. Patrick and agree.          HPI Elements NSCLC  Location: RUL  Quality: Adenocarcinoma  Severity: Stage IV  Duration: Dx 04/09/2016  Context: Metastatic disease-not biopsy proven.  Right chest wall mass biopsy proven to be low-grade leiomyosarcoma  Timing:   Modifying Factors: Tobacco abuse  Associated Signs & Symptoms:    He reports bilateral lower extremity edema.  He reports that this is been an ongoing issue for years.  I reviewed his antihypertensives.  I do not see any diuretics.  I will give him a short course of diuretics.  On exam, the left lower leg is more edematous than the right.  No heat, tenderness, or erythema noted.  He was chopping/loading   wood recently and noted a back pain.  He wanted to review his scans in person.  I reviewed his initial diagnosis scan in addition to the most recent restaging scan.    He reports a nonproductive cough.  Review of Systems  Constitutional: Negative.  Negative for chills, fever and weight loss.  HENT: Negative.   Eyes: Negative.   Respiratory: Positive for cough. Negative for  hemoptysis, sputum production and shortness of breath.   Cardiovascular: Positive for leg swelling. Negative for chest pain.  Gastrointestinal: Negative.  Negative for blood in stool, constipation, diarrhea, melena, nausea and vomiting.  Genitourinary: Negative.   Musculoskeletal: Positive for back pain.  Skin: Negative.   Neurological: Negative.  Negative for weakness.  Endo/Heme/Allergies: Negative.   Psychiatric/Behavioral: Negative.     Past Medical History:  Diagnosis Date  . Adrenal mass, left (HCC) 04/01/2016  . Diabetes mellitus (HCC) 05/02/2016  . Diabetes mellitus without complication (HCC)   . Hypertension   . Mass of breast, right 04/01/2016  . Mass of upper lobe of right lung 04/01/2016   2 adjacent, 5 cm masses, hilar adenopathy  . Primary cancer of right upper lobe of lung (HCC) 04/01/2016   2 adjacent, 5 cm masses, hilar adenopathy  . Pulmonary embolus (HCC) 03/31/2016  . 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HCC)     Past Surgical History:  Procedure Laterality Date  . PORTACATH PLACEMENT Left 05/13/2016   Procedure: INSERTION PORT-A-CATH;  Surgeon: Mark Jenkins, MD;  Location: [facility];  Service: General;  Laterality: Left;    No family history on file.  Social History   Social History  . Marital status: Divorced    Spouse name: N/A  . Number of children: N/A  . Years of education: N/A   Social History Main Topics  . Smoking status: Current Every Day Smoker    Years: 61.00    Types: E-cigarettes    Last attempt to quit: 04/11/2014  . Smokeless tobacco: Never Used     Comment: patient does vape smoking x 2 years  . Alcohol use No     Comment: quit 10 years  . Drug use: No  . Sexual activity: Not on file   Other Topics Concern  . Not on file   Social History Narrative  . No narrative on file     PHYSICAL EXAMINATION  ECOG PERFORMANCE STATUS: 1 - Symptomatic but completely ambulatory  There were no vitals filed for this visit.  GENERAL:alert, no distress,  well nourished, well developed, comfortable, cooperative, obese, smiling and accompanied by daughter, in chemo-recliner. SKIN: skin color, texture, turgor are normal, no rashes or significant lesions  Maculopapular rashes on the back. HEAD: Normocephalic, No masses, lesions, tenderness or abnormalities EYES: normal, EOMI EARS: External ears normal OROPHARYNX:lips, buccal mucosa, and tongue normal and mucous membranes are moist  NECK: supple, no adenopathy, trachea midline LYMPH:  no palpable lymphadenopathy BREAST:not examined LUNGS: clear to auscultation , decreased breath sounds HEART: regular rate & rhythm, no murmurs and no gallops ABDOMEN:abdomen soft, obese and normal bowel sounds BACK: Back symmetric, no curvature. EXTREMITIES:less then 2 second capillary refill, no joint deformities, effusion, or inflammation, no skin discoloration, no cyanosis, positive findings:  edema B/L LE edema, L>R, 2+ pitting.  NO erythema, heat, or pain on deep palpation.  No palpable cords.  NEURO: alert & oriented x 3 with fluent speech, no focal motor/sensory deficits, gait normal   LABORATORY DATA: CBC    Component Value Date/Time   WBC 13.0 (H)   09/23/2016 0834   RBC 5.11 09/23/2016 0834   HGB 14.7 09/23/2016 0834   HGB 13.5 05/02/2016 0852   HCT 43.6 09/23/2016 0834   HCT 41.0 05/02/2016 0852   PLT 262 09/23/2016 0834   PLT 436 (H) 05/02/2016 0852   MCV 85.3 09/23/2016 0834   MCV 85.7 05/02/2016 0852   MCH 28.8 09/23/2016 0834   MCHC 33.7 09/23/2016 0834   RDW 15.4 09/23/2016 0834   RDW 13.9 05/02/2016 0852   LYMPHSABS 3.5 09/23/2016 0834   LYMPHSABS 3.4 (H) 05/02/2016 0852   MONOABS 1.4 (H) 09/23/2016 0834   MONOABS 1.2 (H) 05/02/2016 0852   EOSABS 0.4 09/23/2016 0834   EOSABS 1.6 (H) 05/02/2016 0852   BASOSABS 0.0 09/23/2016 0834   BASOSABS 0.2 (H) 05/02/2016 0852      Chemistry      Component Value Date/Time   NA 138 09/23/2016 0834   NA 139 05/02/2016 0852   K 3.9  09/23/2016 0834   K 4.2 05/02/2016 0852   CL 104 09/23/2016 0834   CO2 26 09/23/2016 0834   CO2 24 05/02/2016 0852   BUN 16 09/23/2016 0834   BUN 10.6 05/02/2016 0852   CREATININE 1.02 09/23/2016 0834   CREATININE 1.0 05/02/2016 0852      Component Value Date/Time   CALCIUM 8.9 09/23/2016 0834   CALCIUM 9.2 05/02/2016 0852   ALKPHOS 74 09/23/2016 0834   ALKPHOS 73 05/02/2016 0852   AST 16 09/23/2016 0834   AST 12 05/02/2016 0852   ALT 15 (L) 09/23/2016 0834   ALT 14 05/02/2016 0852   BILITOT 0.7 09/23/2016 0834   BILITOT 0.42 05/02/2016 0852      Lab Results  Component Value Date   TSH 2.946 09/23/2016     PENDING LABS:  All lab data has been reviewed And they are in acceptable range RADIOGRAPHIC STUDIES: CT scan has been reviewed (April, 2018) continues to show response   PATHOLOGY:    ASSESSMENT AND PLAN:   Stage IV adenocarcinoma of lung Responding to take keytruvada ORDERS PLACED FOR THIS ENCOUNTER: Continue chemotherapy  MEDICATIONS PRESCRIBED THIS ENCOUNTER: No orders of the defined types were placed in this encounter.   THERAPY PLAN:  Continue Keytruda.    All questions were answered. The patient knows to call the clinic with any problems, questions or concerns. We can certainly see the patient much sooner if necessary.  Patient and plan discussed with Dr. Louise Zhou and she is in agreement with the aforementioned.   This note is electronically signed by:  , MD 10/14/2016 8:50 AM  

## 2016-10-14 NOTE — Patient Instructions (Addendum)
[place] at [facility] Discharge Instructions  RECOMMENDATIONS MADE BY THE CONSULTANT AND ANY TEST RESULTS WILL BE SENT TO YOUR REFERRING PHYSICIAN.  You were seen today by Dr. Oliva Bustard. Return in 3 weeks for labs, treatment and follow up.    Thank you for choosing [place] at [facility] to provide your oncology and hematology care.  To afford each patient quality time with our provider, please arrive at least 15 minutes before your scheduled appointment time.    If you have a lab appointment with the [place] [facility] please come in thru the  Main Entrance and check in at the main information desk  You need to re-schedule your appointment should you arrive 10 or more minutes late.  We strive to give you quality time with our providers, and arriving late affects you and other patients whose appointments are after yours.  Also, if you no show three or more times for appointments you may be dismissed from the clinic at the providers discretion.     Again, thank you for choosing [facility].  Our hope is that these requests will decrease the amount of time that you wait before being seen by our physicians.       _____________________________________________________________  Should you have questions after your visit to [facility], please contact our office at [phone] between the hours of 8:30 a.m. and 4:30 p.m.  Voicemails left after 4:30 p.m. will not be returned until the following business day.  For prescription refill requests, have your pharmacy contact our office.       Resources For Cancer Patients and their Caregivers ? American Cancer Society: Can assist with transportation, wigs, general needs, runs Look Good Feel Better.        253-056-2747 ? Cancer Care: Provides financial assistance, online support groups, medication/co-pay assistance.  1-800-813-HOPE 212-617-1773) ? [place] Assists [place] cancer patients and their families through emotional , educational and financial support.  [phone] ? [place] DSS Where to apply for food stamps, Medicaid and utility assistance. 726-852-9310 ? RCATS: Transportation to medical appointments. 402-083-5927 ? Social Security Administration: May apply for disability if have a Stage IV cancer. 417-702-8678 (249)260-7197 ? [employer], Disability and Transit Services: Assists with nutrition, care and transit needs. [place] Support Programs: @10RELATIVEDAYS @ > Cancer Support Group  2nd Tuesday of the month 1pm-2pm, Journey Room  > Creative Journey  3rd Tuesday of the month 1130am-1pm, Journey Room  > Look Good Feel Better  1st Wednesday of the month 10am-12 noon, Journey Room (Call [place] to register [phone])

## 2016-10-14 NOTE — Patient Instructions (Signed)
[facility] Discharge Instructions for Patients Receiving Chemotherapy   Beginning January 23rd 2017 lab work for the [facility] will be done in the  Main lab at [facility] on 1st floor. If you have a lab appointment with the [place] please come in thru the  Main Entrance and check in at the main information desk   Today you received the following chemotherapy agents:  Keytruda  If you develop nausea and vomiting, or diarrhea that is not controlled by your medication, call the clinic.  The clinic phone number is [phone]. Office hours are Monday-Friday 8:30am-5:00pm.  BELOW ARE SYMPTOMS THAT SHOULD BE REPORTED IMMEDIATELY:  *FEVER GREATER THAN 101.0 F  *CHILLS WITH OR WITHOUT FEVER  NAUSEA AND VOMITING THAT IS NOT CONTROLLED WITH YOUR NAUSEA MEDICATION  *UNUSUAL SHORTNESS OF BREATH  *UNUSUAL BRUISING OR BLEEDING  TENDERNESS IN MOUTH AND THROAT WITH OR WITHOUT PRESENCE OF ULCERS  *URINARY PROBLEMS  *BOWEL PROBLEMS  UNUSUAL RASH Items with * indicate a potential emergency and should be followed up as soon as possible. If you have an emergency after office hours please contact your primary care physician or go to the nearest emergency department.  Please call the clinic during office hours if you have any questions or concerns.   You may also contact the Patient Navigator at [phone] should you have any questions or need assistance in obtaining follow up care.      Resources For Cancer Patients and their Caregivers ? American Cancer Society: Can assist with transportation, wigs, general needs, runs Look Good Feel Better.        308-147-7335 ? Cancer Care: Provides financial assistance, online support groups, medication/co-pay assistance.  1-800-813-HOPE 585-121-2970) ? [place] Assists [place] cancer patients and their families through emotional , educational and financial support.   2195392343 ? [place] DSS Where to apply for food stamps, Medicaid and utility assistance. 734-773-5883 ? RCATS: Transportation to medical appointments. (972) 129-6342 ? Social Security Administration: May apply for disability if have a Stage IV cancer. (602) [phone] 275 0207 ? [employer], Disability and Transit Services: Assists with nutrition, care and transit needs. 318-133-5959

## 2016-10-14 NOTE — Progress Notes (Signed)
Tolerated infusion w/o adverse reaction.  Alert, in no distress.  VSS.  Discharged ambulatory.  

## 2016-10-16 ENCOUNTER — Ambulatory Visit (HOSPITAL_COMMUNITY): Payer: Medicare Other

## 2016-10-23 ENCOUNTER — Other Ambulatory Visit (HOSPITAL_COMMUNITY): Payer: Self-pay | Admitting: Oncology

## 2016-10-23 DIAGNOSIS — C3411 Malignant neoplasm of upper lobe, right bronchus or lung: Secondary | ICD-10-CM

## 2016-11-01 ENCOUNTER — Encounter (HOSPITAL_COMMUNITY): Payer: Self-pay | Admitting: Emergency Medicine

## 2016-11-01 ENCOUNTER — Emergency Department (HOSPITAL_COMMUNITY)
Admission: EM | Admit: 2016-11-01 | Discharge: 2016-11-01 | Disposition: A | Discharge status: Home / Self Care | Payer: Medicare Other | Attending: Emergency Medicine | Admitting: Emergency Medicine

## 2016-11-01 ENCOUNTER — Emergency Department (HOSPITAL_COMMUNITY): Payer: Medicare Other

## 2016-11-01 DIAGNOSIS — Z85118 Personal history of other malignant neoplasm of bronchus and lung: Secondary | ICD-10-CM | POA: Insufficient documentation

## 2016-11-01 DIAGNOSIS — R319 Hematuria, unspecified: Secondary | ICD-10-CM | POA: Diagnosis not present

## 2016-11-01 DIAGNOSIS — Z7984 Long term (current) use of oral hypoglycemic drugs: Secondary | ICD-10-CM | POA: Diagnosis not present

## 2016-11-01 DIAGNOSIS — F17290 Nicotine dependence, other tobacco product, uncomplicated: Secondary | ICD-10-CM | POA: Insufficient documentation

## 2016-11-01 DIAGNOSIS — I10 Essential (primary) hypertension: Secondary | ICD-10-CM | POA: Insufficient documentation

## 2016-11-01 DIAGNOSIS — E119 Type 2 diabetes mellitus without complications: Secondary | ICD-10-CM | POA: Insufficient documentation

## 2016-11-01 DIAGNOSIS — K5730 Diverticulosis of large intestine without perforation or abscess without bleeding: Secondary | ICD-10-CM | POA: Diagnosis not present

## 2016-11-01 DIAGNOSIS — Z79899 Other long term (current) drug therapy: Secondary | ICD-10-CM | POA: Diagnosis not present

## 2016-11-01 LAB — URINALYSIS, ROUTINE W REFLEX MICROSCOPIC
Bilirubin Urine: NEGATIVE
Glucose, UA: NEGATIVE mg/dL
Ketones, ur: NEGATIVE mg/dL
Leukocytes, UA: NEGATIVE
Nitrite: NEGATIVE
Protein, ur: 100 mg/dL — AB
Specific Gravity, Urine: 1.01 (ref 1.005–1.030)
pH: 5.5 (ref 5.0–8.0)

## 2016-11-01 LAB — CBC WITH DIFFERENTIAL/PLATELET
Basophils Absolute: 0.1 10*3/uL (ref 0.0–0.1)
Basophils Relative: 1 %
Eosinophils Absolute: 0.3 10*3/uL (ref 0.0–0.7)
Eosinophils Relative: 4 %
HCT: 44.1 % (ref 39.0–52.0)
Hemoglobin: 15.1 g/dL (ref 13.0–17.0)
Lymphocytes Relative: 30 %
Lymphs Abs: 2.4 10*3/uL (ref 0.7–4.0)
MCH: 29.3 pg (ref 26.0–34.0)
MCHC: 34.2 g/dL (ref 30.0–36.0)
MCV: 85.5 fL (ref 78.0–100.0)
Monocytes Absolute: 0.9 10*3/uL (ref 0.1–1.0)
Monocytes Relative: 11 %
Neutro Abs: 4.4 10*3/uL (ref 1.7–7.7)
Neutrophils Relative %: 54 %
Platelets: 261 10*3/uL (ref 150–400)
RBC: 5.16 MIL/uL (ref 4.22–5.81)
RDW: 14.3 % (ref 11.5–15.5)
WBC: 8.1 10*3/uL (ref 4.0–10.5)

## 2016-11-01 LAB — URINALYSIS, MICROSCOPIC (REFLEX)
Bacteria, UA: NONE SEEN
Squamous Epithelial / LPF: NONE SEEN
WBC, UA: NONE SEEN WBC/hpf (ref 0–5)

## 2016-11-01 LAB — COMPREHENSIVE METABOLIC PANEL
ALT: 17 U/L (ref 17–63)
AST: 23 U/L (ref 15–41)
Albumin: 4.1 g/dL (ref 3.5–5.0)
Alkaline Phosphatase: 78 U/L (ref 38–126)
Anion gap: 10 (ref 5–15)
BUN: 11 mg/dL (ref 6–20)
CO2: 26 mmol/L (ref 22–32)
Calcium: 9 mg/dL (ref 8.9–10.3)
Chloride: 102 mmol/L (ref 101–111)
Creatinine, Ser: 1.13 mg/dL (ref 0.61–1.24)
GFR calc Af Amer: >60 mL/min (ref >60)
GFR calc non Af Amer: >60 mL/min (ref >60)
Glucose, Bld: 121 mg/dL — ABNORMAL HIGH (ref 65–99)
Potassium: 4.4 mmol/L (ref 3.5–5.1)
Sodium: 138 mmol/L (ref 135–145)
Total Bilirubin: 0.7 mg/dL (ref 0.3–1.2)
Total Protein: 7.3 g/dL (ref 6.5–8.1)

## 2016-11-01 MED ORDER — IOPAMIDOL (ISOVUE-300) INJECTION 61%
100.0000 mL | Freq: Once | INTRAVENOUS | Status: AC | PRN
Start: 2016-11-01 — End: 2016-11-01
  Administered 2016-11-01: 100 mL via INTRAVENOUS

## 2016-11-01 MED ORDER — SODIUM CHLORIDE 0.9 % IV BOLUS (SEPSIS)
1000.0000 mL | Freq: Once | INTRAVENOUS | Status: AC
  Administered 2016-11-01: 1000 mL via INTRAVENOUS

## 2016-11-01 NOTE — Discharge Instructions (Signed)
Please read and follow all provided instructions.  Your diagnoses today include:  1. Hematuria, unspecified type     Tests performed today include: Vital signs. See below for your results today.   Medications prescribed:  Take as prescribed   Home care instructions:  Follow any educational materials contained in this packet.  Follow-up instructions: Please follow-up with Urology for further evaluation of symptoms and treatment   Return instructions:  Please return to the Emergency Department if you do not get better, if you get worse, or new symptoms OR  - Fever (temperature greater than 101.84F)  - Bleeding that does not stop with holding pressure to the area    -Severe pain (please note that you may be more sore the day after your accident)  - Chest Pain  - Difficulty breathing  - Severe nausea or vomiting  - Inability to tolerate food and liquids  - Passing out  - Skin becoming red around your wounds  - Change in mental status (confusion or lethargy)  - New numbness or weakness    Please return if you have any other emergent concerns.  Additional Information:  Your vital signs today were: BP (!) 150/87    Pulse 89    Temp 98.2 F (36.8 C)    Resp 18    Ht 5\' 9"  (1.753 m)    Wt 103 kg (227 lb)    SpO2 97%    BMI 33.52 kg/m  If your blood pressure (BP) was elevated above 135/85 this visit, please have this repeated by your doctor within one month. ---------------

## 2016-11-01 NOTE — ED Provider Notes (Signed)
[facility] DEPT Provider Note   CSN: [id number] Arrival date & time: 11/01/16  0825     History   Chief Complaint Chief Complaint  Patient presents with  . Hematuria    HPI Darrell Christensen is a 71 y.o. male.  HPI  71 y.o. male with a hx of DM, HTN, PE on Xarelto, Stage IV (T3N2M1B) adenocarcinoma of RUL, mediastinal lymphadenopathy, and L adrenal mets., presents to the Emergency Department today due to hematuria this AM. Notes mild lower abdominal pain. Denies pain currently. Notes gross hematuria. No CP/SOB. Denies dysuria. No N/V/D. Pt currently on Chemotherapy. Has been no x 5-6 months. No radiation. No fevers. No other symptoms noted.   Past Medical History:  Diagnosis Date  . Adrenal mass, left ([place]) 04/01/2016  . Diabetes mellitus ([place]) 05/02/2016  . Diabetes mellitus without complication ([place])   . Hypertension   . Mass of breast, right 04/01/2016  . Mass of upper lobe of right lung 04/01/2016   2 adjacent, 5 cm masses, hilar adenopathy  . Primary cancer of right upper lobe of lung ([place]) 04/01/2016   2 adjacent, 5 cm masses, hilar adenopathy  . Pulmonary embolus ([place]) 03/31/2016  . 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 Itching 10/10/2016  . Diabetes mellitus ([place]) 05/02/2016  . 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 Primary cancer of right upper lobe of lung ([place]) 04/01/2016  . Breast nodule 04/01/2016  . Adrenal mass, left ([place]) 04/01/2016  . HTN (hypertension), benign 04/01/2016  . Pulmonary embolus ([place]) 03/31/2016    Past Surgical History:  Procedure Laterality Date  . PORTACATH PLACEMENT Left 05/13/2016   Procedure: INSERTION PORT-A-CATH;  Surgeon: Aviva Signs, MD;  Location: [facility];  Service: General;  Laterality: Left;       Home Medications    Prior to Admission medications   Medication Sig Start Date End Date Taking? Authorizing Provider  ALPRAZolam [name]) 1 MG tablet Take 1 mg by mouth 3 (three)  times daily as needed for anxiety.    [provider]  amLODipine (NORVASC) 2.5 MG tablet Take 2.5 mg by mouth daily. 03/22/16   [provider]  furosemide (LASIX) 20 MG tablet Take 1 tablet (20 mg total) by mouth daily. 09/23/16   Baird Cancer, PA-C  GLIPIZIDE XL 5 MG 24 hr tablet Take 5 mg by mouth daily. 03/22/16   [provider]  HYDROcodone-acetaminophen (NORCO/VICODIN) 5-325 MG tablet Take 1-2 tablets by mouth every 6 (six) hours as needed for moderate pain. 07/19/16   Brunetta Genera, MD  hydrocortisone cream 0.5 % Apply 1 application topically 2 (two) times daily. 05/24/16   Baird Cancer, PA-C  hydrOXYzine (ATARAX/VISTARIL) 25 MG tablet Take 1 tablet (25 mg total) by mouth 3 (three) times daily as needed. 10/14/16   Forest Gleason, MD  lidocaine-prilocaine (EMLA) cream Apply a quarter size amount to affected area 1 hour prior to coming to chemotherapy. Cover with plastic wrap. 05/08/16   [place]land, Kelby Fam, MD  metFORMIN (GLUCOPHAGE) 500 MG tablet Take 500 mg by mouth 2 (two) times daily. 03/07/16   [provider]  metoprolol succinate (TOPROL-XL) 25 MG 24 hr tablet TAKE ONE TABLET BY MOUTH IN THE EVENING. 10/24/16   Baird Cancer, PA-C  naproxen (NAPROSYN) 500 MG tablet Take 1 tablet (500 mg total) by mouth 2 (two) times daily with a meal. 09/23/16   Baird Cancer, PA-C  ondansetron (ZOFRAN) 8 MG tablet Take 8 mg by mouth every 8 (eight) hours as needed for nausea or vomiting.    [provider]  oxyCODONE-acetaminophen (PERCOCET) 10-325 MG tablet  05/17/16   [provider]  Pembrolizumab (KEYTRUDA IV) Inject into the vein.    [provider]  predniSONE (DELTASONE) 20 MG tablet Take at the onset of diarrhea.  Take 5 tablets (100 mg) at one time.  Then call the [facility]. 05/16/16   [place], Kelby Fam, MD  prochlorperazine (COMPAZINE) 10 MG tablet  05/08/16   [provider]  rivaroxaban (XARELTO) 20 MG  TABS tablet Take 20 mg by mouth daily with supper.    [provider]  simvastatin (ZOCOR) 20 MG tablet Take 20 mg by mouth every evening. 03/07/16   [provider]  Skin Protectants, Misc. (EUCERIN) cream Apply topically as needed for dry skin. 08/09/16   Forest Gleason, MD  spironolactone (ALDACTONE) 50 MG tablet Take 1 tablet (50 mg total) by mouth daily. 09/23/16   Baird Cancer, PA-C    Family History History reviewed. No pertinent family history.  Social History Social History  Substance Use Topics  . Smoking status: Current Every Day Smoker    Years: 61.00    Types: E-cigarettes    Last attempt to quit: 04/11/2014  . Smokeless tobacco: Never Used     Comment: patient does vape smoking x 2 years  . Alcohol use No     Comment: quit 10 years     Allergies   Patient has no known allergies.   Review of Systems Review of Systems ROS reviewed and all are negative for acute change except as noted in the HPI.  Physical Exam Updated Vital Signs BP (!) 150/87   Pulse 89   Temp 98.2 F (36.8 C)   Resp 18   Ht 5\' 9"  (1.753 m)   Wt 103 kg (227 lb)   SpO2 97%   BMI 33.52 kg/m   Physical Exam  Constitutional: He is oriented to person, place, and time. Vital signs are normal. He appears well-developed and well-nourished.  HENT:  Head: Normocephalic and atraumatic.  Right Ear: Hearing normal.  Left Ear: Hearing normal.  Eyes: Conjunctivae and EOM are normal. Pupils are equal, round, and reactive to light.  Neck: Normal range of motion. Neck supple.  Cardiovascular: Normal rate, regular rhythm, normal heart sounds and intact distal pulses.   Pulmonary/Chest: Effort normal and breath sounds normal.  Abdominal: Soft. Normal appearance and bowel sounds are normal. There is no tenderness. There is no rigidity, no rebound, no guarding, no CVA tenderness, no tenderness at McBurney's point and negative Murphy's sign.  Musculoskeletal: Normal range of motion.    Neurological: He is alert and oriented to person, place, and time.  Skin: Skin is warm and dry.  Psychiatric: He has a normal mood and affect. His speech is normal and behavior is normal. Thought content normal.  Nursing note and vitals reviewed.  ED Treatments / Results  Labs (all labs ordered are listed, but only abnormal results are displayed) Labs Reviewed  COMPREHENSIVE METABOLIC PANEL - Abnormal; Notable for the following:       Result Value   Glucose, Bld 121 (*)    All other components within normal limits  URINALYSIS, ROUTINE W REFLEX MICROSCOPIC - Abnormal; Notable for the following:    Color, Urine AMBER (*)    Hgb urine dipstick LARGE (*)    Protein, ur 100 (*)  All other components within normal limits  CBC WITH DIFFERENTIAL/PLATELET  URINALYSIS, MICROSCOPIC (REFLEX)    EKG  EKG Interpretation None       Radiology Ct Abdomen Pelvis W Contrast  Result Date: 11/01/2016 CLINICAL DATA:  Painless gross hematuria since earlier this morning history of right upper lobe lung cancer EXAM: CT ABDOMEN AND PELVIS WITH CONTRAST TECHNIQUE: Multidetector CT imaging of the abdomen and pelvis was performed using the standard protocol following bolus administration of intravenous contrast. CONTRAST:  131mL ISOVUE-300 IOPAMIDOL (ISOVUE-300) INJECTION 61% COMPARISON:  08/06/2016, at 11:26 2017 FINDINGS: Lower chest: Stable persistent right breast nodule subcutaneously measuring 18 mm, image 5. Mild cardiomegaly. No pericardial or pleural effusion. Minor dependent bibasilar atelectasis. Hepatobiliary: No focal liver abnormality is seen. No gallstones, gallbladder wall thickening, or biliary dilatation. Pancreas: Unremarkable. No pancreatic ductal dilatation or surrounding inflammatory changes. Spleen: Spleen is normal in size. Scattered punctate splenic granulomata. Accessory splenule noted inferiorly. Adrenals/Urinary Tract: Stable indeterminate 3.1 x 1.7 cm left adrenal nodule. Slight  thickening of the right adrenal gland. Kidneys demonstrate no acute obstructive uropathy, hydronephrosis, or associated obstructing ureteral calculus. Ureters are symmetric and decompressed. Urinary bladder unremarkable. Stomach/Bowel: Negative for bowel obstruction, significant dilatation, ileus, or free air. Normal appendix demonstrated. Scattered colonic diverticulosis. No acute inflammatory process. No fluid collection or abscess. Small incidental duodenal diverticulum noted, image 42. Vascular/Lymphatic: Extensive aortoiliac atherosclerosis. Negative for aneurysm or occlusive process. No dissection or retroperitoneal hemorrhage. Mesenteric and renal vasculature appear patent. Reproductive: Symmetric seminal vesicles. Prostate gland is enlarged impressing upon the bladder base with a heterogeneous appearance. Other: Fat containing umbilical hernia present. Negative for ascites. Musculoskeletal: Degenerative changes of the spine. No acute osseous finding or compression fracture. IMPRESSION: No acute intra-abdominal or pelvic finding by CT.  Stable exam. Stable indeterminate 3.1 cm left adrenal nodule. Aortoiliac atherosclerosis without aneurysm Fat containing umbilical hernia Diverticulosis without acute inflammation or obstruction Prostate enlargement 18 mm right breast/chest subcutaneous nodule. This was previously biopsy 04/02/2016. Correlate with pathology. Electronically Signed   By: Jerilynn Mages.  Shick M.D.   On: 11/01/2016 11:43    Procedures Procedures (including critical care time)  Medications Ordered in ED Medications  sodium chloride 0.9 % bolus 1,000 mL (0 mLs Intravenous Stopped 11/01/16 1116)  iopamidol (ISOVUE-300) 61 % injection 100 mL (100 mLs Intravenous Contrast Given 11/01/16 1113)     Initial Impression / Assessment and Plan / ED Course  I have reviewed the triage vital signs and the nursing notes.  Pertinent labs & imaging results that were available during my care of the patient were  reviewed by me and considered in my medical decision making (see chart for details).  Final Clinical Impressions(s) / ED Diagnoses  {I have reviewed and evaluated the relevant laboratory values. {I have reviewed and evaluated the relevant imaging studies.  {I have reviewed the relevant previous healthcare records.  {I obtained HPI from historian. {Patient discussed with supervising physician.  ED Course:  Assessment: Pt is a 71 y.o. male with hx DM, HTN, PE on Xarelto, Stage IV (T3N2M1B) adenocarcinoma of RUL, mediastinal lymphadenopathy, and L adrenal mets who presents with painless hematuria this AM. No dysuria. Mild suprapubic abdominal pressure. No N/V/D. No CP/SOB. Pt currently on Chemotherapy for Adenocarcinoma of the RUL. On exam, pt in NAD. Nontoxic/nonseptic appearing. VSS. Afebrile. Lungs CTA. Heart RRR. Abdomen nontender soft. CBC unremarkable. BMP unremarkable. UA with hematuria noted. CT Abdomen/Pelvis with no acute abnormalities . Noted prostate enlargement. Discussed with attending physician. Consult to Urology (Dr.  Jeffie Pollock) will see as outpatient. Likely will require cytoscopy. Hematuria has resolved in ED. Possible prostatic origin. Plan is to DC home with follow up to Urology. At time of discharge, Patient is in no acute distress. Vital Signs are stable. Patient is able to ambulate. Patient able to tolerate PO.   Oncology- Dr. Talbert Cage  Disposition/Plan:  DC Home Additional Verbal discharge instructions given and discussed with patient.  Pt Instructed to f/u with Urology in the next week for evaluation and treatment of symptoms. Return precautions given Pt acknowledges and agrees with plan  Supervising Physician Francine Graven, DO  Final diagnoses:  Hematuria, unspecified type    New Prescriptions New Prescriptions   No medications on file     Shary Decamp, PA-C 11/01/16 [place], [place], DO 11/02/16 1242

## 2016-11-01 NOTE — ED Triage Notes (Signed)
PT c/o blood noted in his urine that started this am with some pain to lower abdomen but no n/v/d.

## 2016-11-04 ENCOUNTER — Encounter (HOSPITAL_COMMUNITY): Payer: Medicare Other | Attending: Oncology

## 2016-11-04 ENCOUNTER — Encounter (HOSPITAL_BASED_OUTPATIENT_CLINIC_OR_DEPARTMENT_OTHER): Payer: Medicare Other | Admitting: Oncology

## 2016-11-04 ENCOUNTER — Encounter (HOSPITAL_COMMUNITY): Payer: Self-pay

## 2016-11-04 VITALS — BP 156/91 | HR 67 | Temp 98.1°F | Resp 18 | Wt 228.4 lb

## 2016-11-04 VITALS — BP 143/91 | HR 74 | Temp 97.6°F | Resp 18

## 2016-11-04 DIAGNOSIS — C7989 Secondary malignant neoplasm of other specified sites: Secondary | ICD-10-CM

## 2016-11-04 DIAGNOSIS — L299 Pruritus, unspecified: Secondary | ICD-10-CM

## 2016-11-04 DIAGNOSIS — C3411 Malignant neoplasm of upper lobe, right bronchus or lung: Secondary | ICD-10-CM | POA: Diagnosis not present

## 2016-11-04 DIAGNOSIS — Z5112 Encounter for antineoplastic immunotherapy: Secondary | ICD-10-CM

## 2016-11-04 LAB — COMPREHENSIVE METABOLIC PANEL
ALT: 15 U/L — ABNORMAL LOW (ref 17–63)
AST: 16 U/L (ref 15–41)
Albumin: 3.9 g/dL (ref 3.5–5.0)
Alkaline Phosphatase: 81 U/L (ref 38–126)
Anion gap: 8 (ref 5–15)
BUN: 14 mg/dL (ref 6–20)
CO2: 27 mmol/L (ref 22–32)
Calcium: 9 mg/dL (ref 8.9–10.3)
Chloride: 103 mmol/L (ref 101–111)
Creatinine, Ser: 1.05 mg/dL (ref 0.61–1.24)
GFR calc Af Amer: >60 mL/min (ref >60)
GFR calc non Af Amer: >60 mL/min (ref >60)
Glucose, Bld: 130 mg/dL — ABNORMAL HIGH (ref 65–99)
Potassium: 3.9 mmol/L (ref 3.5–5.1)
Sodium: 138 mmol/L (ref 135–145)
Total Bilirubin: 0.6 mg/dL (ref 0.3–1.2)
Total Protein: 7.2 g/dL (ref 6.5–8.1)

## 2016-11-04 LAB — CBC WITH DIFFERENTIAL/PLATELET
Basophils Absolute: 0.1 10*3/uL (ref 0.0–0.1)
Basophils Relative: 1 %
Eosinophils Absolute: 0.4 10*3/uL (ref 0.0–0.7)
Eosinophils Relative: 4 %
HCT: 42.9 % (ref 39.0–52.0)
Hemoglobin: 14.5 g/dL (ref 13.0–17.0)
Lymphocytes Relative: 42 %
Lymphs Abs: 3.9 10*3/uL (ref 0.7–4.0)
MCH: 29.2 pg (ref 26.0–34.0)
MCHC: 33.8 g/dL (ref 30.0–36.0)
MCV: 86.3 fL (ref 78.0–100.0)
Monocytes Absolute: 0.9 10*3/uL (ref 0.1–1.0)
Monocytes Relative: 9 %
Neutro Abs: 4 10*3/uL (ref 1.7–7.7)
Neutrophils Relative %: 44 %
Platelets: 267 10*3/uL (ref 150–400)
RBC: 4.97 MIL/uL (ref 4.22–5.81)
RDW: 14.4 % (ref 11.5–15.5)
WBC: 9.1 10*3/uL (ref 4.0–10.5)

## 2016-11-04 LAB — TSH: TSH: 3.453 u[IU]/mL (ref 0.350–4.500)

## 2016-11-04 MED ORDER — SODIUM CHLORIDE 0.9 % IV SOLN
Freq: Once | INTRAVENOUS | Status: AC
Start: 2016-11-04 — End: 2016-11-04
  Administered 2016-11-04: 10:00:00 via INTRAVENOUS

## 2016-11-04 MED ORDER — HEPARIN SOD (PORK) LOCK FLUSH 100 UNIT/ML IV SOLN
INTRAVENOUS | Status: AC
  Filled 2016-11-04: qty 5

## 2016-11-04 MED ORDER — SODIUM CHLORIDE 0.9 % IV SOLN
200.0000 mg | Freq: Once | INTRAVENOUS | Status: AC
  Administered 2016-11-04: 200 mg via INTRAVENOUS
  Filled 2016-11-04: qty 8

## 2016-11-04 MED ORDER — HEPARIN SOD (PORK) LOCK FLUSH 100 UNIT/ML IV SOLN
500.0000 [IU] | Freq: Once | INTRAVENOUS | Status: AC | PRN
  Administered 2016-11-04: 500 [IU]

## 2016-11-04 NOTE — Progress Notes (Signed)
See labs from 6/29 - okay to use for tx today per MD.  Tolerated infusion w/o adverse reaction.  Alert, in no distress.  VSS.  Discharged ambulatory.

## 2016-11-04 NOTE — Progress Notes (Signed)
Darrell Christensen, [place] [address]   CLINIC:  Medical Oncology/Hematology Progress Note  PCP:  Celene Squibb, MD [place] [address] [phone]   REASON FOR VISIT:  Follow-up for Stage IV NSCLC AND right chest wall leiomyosarcoma   CURRENT THERAPY: Keytruda every 21 days    BRIEF ONCOLOGIC HISTORY:    Primary cancer of right upper lobe of lung ([place])   05/14/2015 Procedure    Port placement by Dr. Arnoldo Morale      04/01/2016 Initial Diagnosis    Primary cancer of right upper lobe of lung ([place])     04/02/2016 Procedure    Ultrasound-guided core biopsy performed of a solid 1.5 cm soft tissue nodule in the right chest wall.      04/03/2016 Imaging    MRI brain- No acute and intracranial process or metastasis.  Moderate chronic small vessel ischemic disease.      04/04/2016 Pathology Results    Diagnosis Soft Tissue Needle Core Biopsy, Right Chest Wall SMOOTH MUSCLE NEOPLASM Microscopic Comment The differential diagnosis include low grade leiomyosarcoma. The neoplasm shows spindle cell morphology with rare mitotic figures and minimal atypia, no necrosis present. These cells stains positive for smooth muscle actin, desmin, negative for cytokeratin AE1&3, ck8/18, s100, cd117, cd 34 and cd99. This case also reviewed by Dr. [name] and agree.      04/05/2016 Procedure    CT-guided biopsy of right upper lobe lesion, with tissue specimen sent to pathology, by IR.      04/09/2016 Pathology Results    Lung, needle/core biopsy(ies), Right Upper Lobe - NON SMALL CELL CARCINOMA.      04/17/2016 Pathology Results    PDL1 High Expression.  TPS 50%.      04/24/2016 PET scan    1. Prominently hypermetabolic large right upper lobe mass with metastatic disease to the right paratracheal and right hilar nodal chains, to the descending mesocolon, to the left upper buttock subcutaneous tissues, to the left upper lobe, to the  right subscapularis muscle, and possibly to the left adrenal gland and right breast subcutaneous tissues. Appearance compatible with stage IV lung cancer. 2. There is some focal high activity in the ascending colon which is probably from peristalsis, less likely from a local colon mass. This does raise the possibility that the adjacent mesocolon tumor implant could be due to synchronous colon cancer rather than lung metastasis. 3. Other imaging findings of potential clinical significance: Coronary, aortic arch, and branch vessel atherosclerotic vascular disease. Aortoiliac atherosclerotic vascular disease. Deformity left proximal humerus and left glenohumeral joint from prior trauma. Enlarged prostate gland.      04/26/2016 Pathology Results    FoundationONE: Genomic alterations identified- KRAS M09O, CREBBP splice site [id number], LRP1B S515*, MOQ94 T65*, YYT03 splice site [id number], SPTA1 splice site [id number], TP53 C135Y.  Additional findings- MS-Stable, TMB-intermediate (14 Muts/Mb).  No reportable alterations- EGFR, ALK, BRAF, MET, ERBB2, RET, ROS1.      05/17/2016 -  Chemotherapy    The patient had ONDANSETRON IVPB [facility] +/- DEXAMETHASONE, , Intravenous,  Once, 0 of 1 cycle  pembrolizumab (KEYTRUDA) 200 mg in sodium chloride 0.9 % 50 mL chemo infusion, 200 mg, Intravenous, Once, 0 of 5 cycles  for chemotherapy treatment.        08/06/2016 PET scan    IMPRESSION: 1. Overall considerable improvement. The right upper lobe mass is markedly reduced in volume and SUV. Thoracic adenopathy have resolved and the hypermetabolic lesion in  the right subscapularis muscle has also resolved. 2. The left upper lobe nodule is no longer appreciably hypermetabolic, and is highly indistinct although this may be due to motion artifact. Faint in indistinct nodularity elsewhere in the lungs likewise not currently hypermetabolic. 3. There is some very faint residual low-grade activity along  the subcutaneous deposit along the upper left buttock region. Marked reduction in size and activity of the tumor deposit adjacent to the descending colon. 4. Prior right pleural effusion has resolved. 5. Stable appearance of the adrenal glands, with low-level activity and a nodule in the left adrenal gland. 6. Stable small nodule in the right breast, low-grade metabolic activity with maximum SUV 2.2 (formerly 3.0) 7. Other imaging findings of potential clinical significance: Severe arthropathy of the left glenohumeral joint. Coronary, aortic arch, and branch vessel atherosclerotic vascular disease. Aortoiliac atherosclerotic vascular disease. Prominent prostate gland indents the bladder base.        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  Two adjacent large masslike areas of consolidation within the right upper lobe with differential considerations including malignancy or pneumonia. Multiple enlarged right hilar and mediastinal lymph nodes which may be reactive or metastatic in etiology.  Additional indeterminate pulmonary nodules as above. Recommend attention on follow-up.  Indeterminate 2.9 cm left adrenal nodule. This needs dedicated evaluation with pre and post contrast-enhanced CT or MRI after confirmation of pulmonary process.  Indeterminate nodule within the right breast. Recommend dedicated evaluation with mammography.  Hepatic steatosis.      04/02/2016 Procedure    Ultrasound-guided core biopsy performed of a solid 1.5 cm soft tissue nodule in the right chest wall.      04/04/2016 Pathology Results    Diagnosis Soft Tissue Needle Core Biopsy, Right Chest Wall SMOOTH MUSCLE NEOPLASM Microscopic Comment The differential diagnosis include low grade leiomyosarcoma. The neoplasm shows spindle cell morphology with rare  mitotic figures and minimal atypia, no necrosis present. These cells stains positive for smooth muscle actin, desmin, negative for cytokeratin AE1&3, ck8/18, s100, cd117, cd 34 and cd99. This case also reviewed by Dr. [name] and agree.         HISTORY OF PRESENT ILLNESS:  (From Kirby Crigler, PA-C's last note on 05/24/16)     INTERVAL HISTORY:  Darrell Christensen is here for follow-up and cycle 10 of [place]. Patient presents in a treatment chair.  On 11/01/16 patient presents today [facility] ED for complaints of gross hematuria which lasted for about 4-5 hours. A CT abdomen/pelvis was performed in the ED and did not show any intra-abdominal or pelvic pathology to account for his hematuria. He was discharged from the ED where recommendations to follow up with urology as an outpatient for cystoscopy. Patient has yet to make an appointment. He has not had any hematuria since that 1 episode. Otherwise he states he is doing well he denies any chest pain, shortness breath, abdominal pain, focal weakness. He has been tolerating Keytruda well without any side effects.  REVIEW OF SYSTEMS:  Review of Systems  Constitutional: Negative.        Lost 3 pounds since last visit but otherwise stable.  HENT:  Negative.   Eyes: Negative.   Respiratory: Negative.   Cardiovascular: Negative for leg swelling.  Gastrointestinal: Negative.   Endocrine: Negative.   Genitourinary: Positive for hematuria. Negative for dysuria.   Musculoskeletal: Negative.   Skin:  Negative for itching and rash.  Neurological: Negative.   Hematological: Negative.   Psychiatric/Behavioral: Negative.   All other systems reviewed and are negative.   PAST MEDICAL/SURGICAL HISTORY:  Past Medical History:  Diagnosis Date  . Adrenal mass, left ([place]) 04/01/2016  . Diabetes mellitus ([place]) 05/02/2016  . Diabetes mellitus without complication ([place])   . Hypertension   . Mass of breast, right 04/01/2016  . Mass of upper lobe of right  lung 04/01/2016   2 adjacent, 5 cm masses, hilar adenopathy  . Primary cancer of right upper lobe of lung ([place]) 04/01/2016   2 adjacent, 5 cm masses, hilar adenopathy  . Pulmonary embolus ([place]) 03/31/2016  . Spindle cell sarcoma [facility])    Past Surgical History:  Procedure Laterality Date  . PORTACATH PLACEMENT Left 05/13/2016   Procedure: INSERTION PORT-A-CATH;  Surgeon: Aviva Signs, MD;  Location: [facility];  Service: General;  Laterality: Left;     SOCIAL HISTORY:  Social History   Social History  . Marital status: Divorced    Spouse name: N/A  . Number of children: N/A  . Years of education: N/A   Occupational History  . Not on file.   Social History Main Topics  . Smoking status: Current Every Day Smoker    Years: 61.00    Types: E-cigarettes    Last attempt to quit: 04/11/2014  . Smokeless tobacco: Never Used     Comment: patient does vape smoking x 2 years  . Alcohol use No     Comment: quit 10 years  . Drug use: No  . Sexual activity: Not on file   Other Topics Concern  . Not on file   Social History Narrative  . No narrative on file    FAMILY HISTORY:  History reviewed. No pertinent family history.  CURRENT MEDICATIONS:  Outpatient Encounter Prescriptions as of 06/28/2016  Medication Sig Note  . ALPRAZolam (XANAX) 1 MG tablet Take 1 mg by mouth 3 (three) times daily as needed for anxiety.   Marland Kitchen amLODipine (NORVASC) 2.5 MG tablet Take 2.5 mg by mouth daily.   Marland Kitchen GLIPIZIDE XL 5 MG 24 hr tablet Take 5 mg by mouth daily.   . hydrocortisone cream 0.5 % Apply 1 application topically 2 (two) times daily.   Marland Kitchen lidocaine-prilocaine (EMLA) cream Apply a quarter size amount to affected area 1 hour prior to coming to chemotherapy. Cover with plastic wrap. 05/09/2016: Not started yet  . metFORMIN (GLUCOPHAGE) 500 MG tablet Take 500 mg by mouth 2 (two) times daily.   . metoprolol succinate (TOPROL-XL) 25 MG 24 hr tablet TAKE ONE TABLET BY MOUTH IN THE EVENING.   Marland Kitchen ondansetron  (ZOFRAN) 8 MG tablet Take 8 mg by mouth every 8 (eight) hours as needed for nausea or vomiting.   Marland Kitchen oxyCODONE-acetaminophen (PERCOCET) 10-325 MG tablet Take 1 tablet by mouth every 4 (four) hours as needed for pain.   . Pembrolizumab (KEYTRUDA IV) Inject into the vein.   . predniSONE (DELTASONE) 20 MG tablet Take at the onset of diarrhea.  Take 5 tablets (100 mg) at one time.  Then call the [facility].   . prochlorperazine (COMPAZINE) 10 MG tablet  05/13/2016: Received from: External Pharmacy  . rivaroxaban (XARELTO) 20 MG TABS tablet Take 20 mg by mouth daily with supper. 05/09/2016: On hold due to upcoming procedure  . simvastatin (ZOCOR) 20 MG tablet Take 20 mg by mouth every evening.    No facility-administered encounter medications on file as of  06/28/2016.     ALLERGIES:  No Known Allergies   PHYSICAL EXAM:  ECOG Performance status: 1-2 - Symptomatic; requires occasional assistance     Physical Exam  Constitutional: He is oriented to person, place, and time and well-developed, well-nourished, and in no distress.  Physical exam was performed in a treatment chair.  HENT:  Head: Normocephalic and atraumatic.  Mouth/Throat: Oropharynx is clear and moist. No oropharyngeal exudate.  Eyes: Conjunctivae and EOM are normal. Pupils are equal, round, and reactive to light. No scleral icterus.  Neck: Normal range of motion. Neck supple.  Cardiovascular: Normal rate, regular rhythm and normal heart sounds.   Pulmonary/Chest: Effort normal and breath sounds normal. No respiratory distress.  Abdominal: Soft. Bowel sounds are normal. There is no tenderness.  Musculoskeletal: Normal range of motion.  Lymphadenopathy:    He has no cervical adenopathy.       Right: No supraclavicular adenopathy present.       Left: No supraclavicular adenopathy present.  Neurological: He is alert and oriented to person, place, and time. No cranial nerve deficit. Gait normal.  Skin: Skin is warm and dry. No rash  noted.  Psychiatric: Mood, memory, affect and judgment normal.  Nursing note and vitals reviewed.    LABORATORY DATA:  I have reviewed the labs as listed.  CBC    Component Value Date/Time   WBC 9.1 11/04/2016 0900   RBC 4.97 11/04/2016 0900   HGB 14.5 11/04/2016 0900   HGB 13.5 05/02/2016 0852   HCT 42.9 11/04/2016 0900   HCT 41.0 05/02/2016 0852   PLT 267 11/04/2016 0900   PLT 436 (H) 05/02/2016 0852   MCV 86.3 11/04/2016 0900   MCV 85.7 05/02/2016 0852   MCH 29.2 11/04/2016 0900   MCHC 33.8 11/04/2016 0900   RDW 14.4 11/04/2016 0900   RDW 13.9 05/02/2016 0852   LYMPHSABS 3.9 11/04/2016 0900   LYMPHSABS 3.4 (H) 05/02/2016 0852   MONOABS 0.9 11/04/2016 0900   MONOABS 1.2 (H) 05/02/2016 0852   EOSABS 0.4 11/04/2016 0900   EOSABS 1.6 (H) 05/02/2016 0852   BASOSABS 0.1 11/04/2016 0900   BASOSABS 0.2 (H) 05/02/2016 0852   CMP Latest Ref Rng & Units 11/04/2016 11/01/2016 10/14/2016  Glucose 65 - 99 mg/dL 130(H) 121(H) 105(H)  BUN 6 - 20 mg/dL '14 11 12  ' Creatinine 0.61 - 1.24 mg/dL 1.05 1.13 1.10  Sodium 135 - 145 mmol/L 138 138 139  Potassium 3.5 - 5.1 mmol/L 3.9 4.4 3.7  Chloride 101 - 111 mmol/L 103 102 103  CO2 22 - 32 mmol/L '27 26 26  ' Calcium 8.9 - 10.3 mg/dL 9.0 9.0 9.0  Total Protein 6.5 - 8.1 g/dL 7.2 7.3 7.0  Total Bilirubin 0.3 - 1.2 mg/dL 0.6 0.7 0.6  Alkaline Phos 38 - 126 U/L 81 78 75  AST 15 - 41 U/L '16 23 16  ' ALT 17 - 63 U/L 15(L) 17 15(L)    PENDING LABS:    DIAGNOSTIC IMAGING:  Initial PET scan: 04/24/16    NUCLEAR MEDICINE PET SKULL BASE TO THIGH 08/06/2016  IMPRESSION: 1. Overall considerable improvement. The right upper lobe mass is markedly reduced in volume and SUV. Thoracic adenopathy have resolved and the hypermetabolic lesion in the right subscapularis muscle has also resolved. 2. The left upper lobe nodule is no longer appreciably hypermetabolic, and is highly indistinct although this may be due to motion artifact. Faint in indistinct  nodularity elsewhere in the lungs likewise not currently hypermetabolic. 3. There  is some very faint residual low-grade activity along the subcutaneous deposit along the upper left buttock region. Marked reduction in size and activity of the tumor deposit adjacent to the descending colon. 4. Prior right pleural effusion has resolved. 5. Stable appearance of the adrenal glands, with low-level activity and a nodule in the left adrenal gland. 6. Stable small nodule in the right breast, low-grade metabolic activity with maximum SUV 2.2 (formerly 3.0) 7. Other imaging findings of potential clinical significance: Severe arthropathy of the left glenohumeral joint. Coronary, aortic arch, and branch vessel atherosclerotic vascular disease. Aortoiliac atherosclerotic vascular disease. Prominent prostate gland indents the bladder base.   PATHOLOGY:  Right chest wall biopsy: 04/02/16   RUL Lung biopsy: 04/05/16    ASSESSMENT & PLAN:   Stage IV NSCLC with multiple sites of metastases:  - Continue keytruda. Toelrating well. - Keytruda cycle 10 today. Labs reviewed. Plan to order restaging scans after cycle 12 of keytruda.   Right chest wall leiomyosarcoma:  -stable on PET. Continue observation.  Transient hematuria - currently he has not had any hematuria in the past 2-3 days. Recommended for him to make an appointment with urology for cystoscopy.  PE on xarelto - Plan to repeat CTA in November when he would have completed 1 year of anticoagulation to assess for resolution of PE.  Dispo:  -Return to cancer center in 3 weeks for next cycle Keytruda.    All questions were answered to patient's stated satisfaction. Encouraged patient to call with any new concerns or questions before his next visit to the cancer center and we can certain see him sooner, if needed.     Twana First, MD

## 2016-11-04 NOTE — Patient Instructions (Signed)
[facility] Discharge Instructions for Patients Receiving Chemotherapy   Beginning January 23rd 2017 lab work for the [facility] will be done in the  Main lab at [facility] on 1st floor. If you have a lab appointment with the [facility] please come in thru the  Main Entrance and check in at the main information desk   Today you received the following chemotherapy agents:  Keytruda  If you develop nausea and vomiting, or diarrhea that is not controlled by your medication, call the clinic.  The clinic phone number is [phone]. Office hours are Monday-Friday 8:30am-5:00pm.  BELOW ARE SYMPTOMS THAT SHOULD BE REPORTED IMMEDIATELY:  *FEVER GREATER THAN 101.0 F  *CHILLS WITH OR WITHOUT FEVER  NAUSEA AND VOMITING THAT IS NOT CONTROLLED WITH YOUR NAUSEA MEDICATION  *UNUSUAL SHORTNESS OF BREATH  *UNUSUAL BRUISING OR BLEEDING  TENDERNESS IN MOUTH AND THROAT WITH OR WITHOUT PRESENCE OF ULCERS  *URINARY PROBLEMS  *BOWEL PROBLEMS  UNUSUAL RASH Items with * indicate a potential emergency and should be followed up as soon as possible. If you have an emergency after office hours please contact your primary care physician or go to the nearest emergency department.  Please call the clinic during office hours if you have any questions or concerns.   You may also contact the Patient Navigator at [phone] should you have any questions or need assistance in obtaining follow up care.      Resources For Cancer Patients and their Caregivers ? American Cancer Society: Can assist with transportation, wigs, general needs, runs Look Good Feel Better.        301-446-1526 ? Cancer Care: Provides financial assistance, online support groups, medication/co-pay assistance.  1-800-813-HOPE 361-109-4038) ? [facility] Assists [place] cancer patients and their families through emotional , educational and financial support.   437-154-2813 ? [place] DSS Where to apply for food stamps, Medicaid and utility assistance. 832-506-7127 ? [facility]: Transportation to medical appointments. 4428531642 ? Social Security Administration: May apply for disability if have a Stage IV cancer. 251-318-4241 223-751-6728 ? [employer], Disability and Transit Services: Assists with nutrition, care and transit needs. 712-675-3420

## 2016-11-15 ENCOUNTER — Telehealth (HOSPITAL_COMMUNITY): Payer: Self-pay

## 2016-11-15 NOTE — Telephone Encounter (Signed)
Nutrition  Called to follow-up with patient regarding nutrition.  No answer and message left for patient to return call.  Will follow-up as able  Elira Colasanti B. Zenia Resides, [place], [place] Registered Dietitian [phone] (pager)

## 2016-11-15 NOTE — Telephone Encounter (Signed)
Nutrition Follow-up:  Patient returned RD call.  Patient reports appetite is good on most days.  Usually manages to eat 2 good meals per day and drinks ensure plus.  Wanting last free case of ensure plus.    Patient reports no nausea, vomiting, diarrhea or constipation or trouble chewing or swallowing.  Medications: reviewed  Labs: reviewed  Anthropometrics:   Weight noted on 7/2 228 lb 6.4 oz increased from 225lb on 6/11   NUTRITION DIAGNOSIS: Inadequate oral intake improved    INTERVENTION:   Encouraged patient to continue to consume nutrient dense foods especially good sources of protein.  Examples of foods discussed.  Will order 3rd and final case of ensure plus for patient from pharmacy today.  Patient planning to pick it up on 7/19. Patient knows to contact RD with questions or concerns    MONITORING, EVALUATION, GOAL: Patient will consume adequate calories and protein to maintain lean muscle mass.    NEXT VISIT: as needed  Merie Wulf B. Zenia Resides, [place], [place] Registered Dietitian [phone] (pager)

## 2016-11-21 ENCOUNTER — Other Ambulatory Visit (HOSPITAL_COMMUNITY): Payer: Self-pay | Admitting: *Deleted

## 2016-11-21 DIAGNOSIS — C3411 Malignant neoplasm of upper lobe, right bronchus or lung: Secondary | ICD-10-CM

## 2016-11-22 ENCOUNTER — Other Ambulatory Visit (HOSPITAL_COMMUNITY): Payer: Medicare Other

## 2016-11-22 ENCOUNTER — Other Ambulatory Visit (HOSPITAL_COMMUNITY): Payer: Self-pay | Admitting: Oncology

## 2016-11-22 DIAGNOSIS — C3411 Malignant neoplasm of upper lobe, right bronchus or lung: Secondary | ICD-10-CM

## 2016-11-25 ENCOUNTER — Encounter (HOSPITAL_COMMUNITY): Payer: Medicare Other | Attending: Oncology | Admitting: Oncology

## 2016-11-25 ENCOUNTER — Encounter (HOSPITAL_COMMUNITY): Payer: Self-pay

## 2016-11-25 ENCOUNTER — Encounter (HOSPITAL_BASED_OUTPATIENT_CLINIC_OR_DEPARTMENT_OTHER): Payer: Medicare Other

## 2016-11-25 VITALS — BP 138/84 | HR 64 | Temp 98.6°F | Resp 16 | Wt 229.4 lb

## 2016-11-25 DIAGNOSIS — Z7901 Long term (current) use of anticoagulants: Secondary | ICD-10-CM

## 2016-11-25 DIAGNOSIS — C7989 Secondary malignant neoplasm of other specified sites: Secondary | ICD-10-CM

## 2016-11-25 DIAGNOSIS — Z5112 Encounter for antineoplastic immunotherapy: Secondary | ICD-10-CM | POA: Diagnosis not present

## 2016-11-25 DIAGNOSIS — R600 Localized edema: Secondary | ICD-10-CM | POA: Diagnosis not present

## 2016-11-25 DIAGNOSIS — C3411 Malignant neoplasm of upper lobe, right bronchus or lung: Secondary | ICD-10-CM | POA: Diagnosis not present

## 2016-11-25 DIAGNOSIS — Z86711 Personal history of pulmonary embolism: Secondary | ICD-10-CM | POA: Diagnosis not present

## 2016-11-25 LAB — COMPREHENSIVE METABOLIC PANEL
ALT: 16 U/L — ABNORMAL LOW (ref 17–63)
AST: 16 U/L (ref 15–41)
Albumin: 4 g/dL (ref 3.5–5.0)
Alkaline Phosphatase: 82 U/L (ref 38–126)
Anion gap: 9 (ref 5–15)
BUN: 16 mg/dL (ref 6–20)
CO2: 26 mmol/L (ref 22–32)
Calcium: 9.1 mg/dL (ref 8.9–10.3)
Chloride: 103 mmol/L (ref 101–111)
Creatinine, Ser: 1.06 mg/dL (ref 0.61–1.24)
GFR calc Af Amer: >60 mL/min (ref >60)
GFR calc non Af Amer: >60 mL/min (ref >60)
Glucose, Bld: 135 mg/dL — ABNORMAL HIGH (ref 65–99)
Potassium: 3.9 mmol/L (ref 3.5–5.1)
Sodium: 138 mmol/L (ref 135–145)
Total Bilirubin: 0.4 mg/dL (ref 0.3–1.2)
Total Protein: 6.8 g/dL (ref 6.5–8.1)

## 2016-11-25 LAB — CBC WITH DIFFERENTIAL/PLATELET
Basophils Absolute: 0.1 10*3/uL (ref 0.0–0.1)
Basophils Relative: 1 %
Eosinophils Absolute: 0.5 10*3/uL (ref 0.0–0.7)
Eosinophils Relative: 5 %
HCT: 43.1 % (ref 39.0–52.0)
Hemoglobin: 14.4 g/dL (ref 13.0–17.0)
Lymphocytes Relative: 37 %
Lymphs Abs: 3.7 10*3/uL (ref 0.7–4.0)
MCH: 29.1 pg (ref 26.0–34.0)
MCHC: 33.4 g/dL (ref 30.0–36.0)
MCV: 87.1 fL (ref 78.0–100.0)
Monocytes Absolute: 0.9 10*3/uL (ref 0.1–1.0)
Monocytes Relative: 9 %
Neutro Abs: 4.9 10*3/uL (ref 1.7–7.7)
Neutrophils Relative %: 48 %
Platelets: 274 10*3/uL (ref 150–400)
RBC: 4.95 MIL/uL (ref 4.22–5.81)
RDW: 14.3 % (ref 11.5–15.5)
WBC: 10 10*3/uL (ref 4.0–10.5)

## 2016-11-25 LAB — TSH: TSH: 2.899 u[IU]/mL (ref 0.350–4.500)

## 2016-11-25 MED ORDER — SODIUM CHLORIDE 0.9% FLUSH
10.0000 mL | INTRAVENOUS | Status: DC | PRN
Start: 2016-11-25 — End: 2016-11-25
  Administered 2016-11-25: 10 mL
  Filled 2016-11-25: qty 10

## 2016-11-25 MED ORDER — SPIRONOLACTONE 50 MG PO TABS: 50.0000 mg | ORAL_TABLET | Freq: Every day | ORAL | 0 refills | Status: DC

## 2016-11-25 MED ORDER — PEMBROLIZUMAB CHEMO INJECTION 100 MG/4ML
200.0000 mg | Freq: Once | INTRAVENOUS | Status: AC
  Administered 2016-11-25: 200 mg via INTRAVENOUS
  Filled 2016-11-25: qty 8

## 2016-11-25 MED ORDER — FUROSEMIDE 20 MG PO TABS: 40.0000 mg | ORAL_TABLET | Freq: Every day | ORAL | 0 refills | Status: DC

## 2016-11-25 MED ORDER — HEPARIN SOD (PORK) LOCK FLUSH 100 UNIT/ML IV SOLN
500.0000 [IU] | Freq: Once | INTRAVENOUS | Status: AC | PRN
  Administered 2016-11-25: 500 [IU]

## 2016-11-25 MED ORDER — SODIUM CHLORIDE 0.9 % IV SOLN
Freq: Once | INTRAVENOUS | Status: AC
  Administered 2016-11-25: 11:00:00 via INTRAVENOUS

## 2016-11-25 NOTE — Progress Notes (Signed)
Darrell Squibb, MD [place] [address]  Primary cancer of right upper lobe of lung [facility])  Bilateral leg edema - Plan: furosemide (LASIX) 20 MG tablet, spironolactone (ALDACTONE) 50 MG tablet  CURRENT THERAPY: Keytruda beginning on 05/17/2016 and Xarelto anticoagulation  INTERVAL HISTORY: Darrell Christensen 71 y.o. male returns for followup of Stage IV (T3N2M1B) adenocarcinoma of RUL, mediastinal lymphadenopathy, descending mesocolon (not biopsied- ?synchronous colon cancer?), subcutaneous (L upper buttock, R subscapularis muscle), and L adrenal mets.  Stage IV NOT biopsy proven.  PDL1- high expressor at 50%. AND Low-grade leiomyosarcoma of R chest wall, biopsy proven on 04/02/2016 in the setting of PET scan concerning for metastatic NSCLC. AND PE, RML, small central filling defect.  On Xarelto anticoagulation.  Due for next CT angio chest in November 2018.    Primary cancer of right upper lobe of lung (Darrell Christensen)   05/14/2015 Procedure    Port placement by Dr. Arnoldo Morale      04/01/2016 Initial Diagnosis    Primary cancer of right upper lobe of lung ([place])     04/02/2016 Procedure    Ultrasound-guided core biopsy performed of a solid 1.5 cm soft tissue nodule in the right chest wall.      04/03/2016 Imaging    MRI brain- No acute and intracranial process or metastasis.  Moderate chronic small vessel ischemic disease.      04/04/2016 Pathology Results    Diagnosis Soft Tissue Needle Core Biopsy, Right Chest Wall SMOOTH MUSCLE NEOPLASM Microscopic Comment The differential diagnosis include low grade leiomyosarcoma. The neoplasm shows spindle cell morphology with rare mitotic figures and minimal atypia, no necrosis present. These cells stains positive for smooth muscle actin, desmin, negative for cytokeratin AE1&3, ck8/18, s100, cd117, cd 34 and cd99. This case also reviewed by Dr. [name] and agree.      04/05/2016 Procedure    CT-guided biopsy of right  upper lobe lesion, with tissue specimen sent to pathology, by IR.      04/09/2016 Pathology Results    Lung, needle/core biopsy(ies), Right Upper Lobe - NON SMALL CELL CARCINOMA.      04/17/2016 Pathology Results    PDL1 High Expression.  TPS 50%.      04/24/2016 PET scan    1. Prominently hypermetabolic large right upper lobe mass with metastatic disease to the right paratracheal and right hilar nodal chains, to the descending mesocolon, to the left upper buttock subcutaneous tissues, to the left upper lobe, to the right subscapularis muscle, and possibly to the left adrenal gland and right breast subcutaneous tissues. Appearance compatible with stage IV lung cancer. 2. There is some focal high activity in the ascending colon which is probably from peristalsis, less likely from a local colon mass. This does raise the possibility that the adjacent mesocolon tumor implant could be due to synchronous colon cancer rather than lung metastasis. 3. Other imaging findings of potential clinical significance: Coronary, aortic arch, and branch vessel atherosclerotic vascular disease. Aortoiliac atherosclerotic vascular disease. Deformity left proximal humerus and left glenohumeral joint from prior trauma. Enlarged prostate gland.      04/26/2016 Pathology Results    FoundationONE: Genomic alterations identified- KRAS T41D, CREBBP splice site [id number], LRP1B S515*, GXQ11 H41*, DEY81 splice site [id number], SPTA1 splice site [id number]>[id number], TP53 C135Y.  Additional findings- MS-Stable, TMB-intermediate (14 Muts/Mb).  No reportable alterations- EGFR, ALK, BRAF, MET, ERBB2, RET, ROS1.      05/17/2016 -  Chemotherapy  The patient had ONDANSETRON IVPB [facility] +/- DEXAMETHASONE, , Intravenous,  Once, 0 of 1 cycle  pembrolizumab (KEYTRUDA) 200 mg in sodium chloride 0.9 % 50 mL chemo infusion, 200 mg, Intravenous, Once, 0 of 5 cycles  for chemotherapy treatment.        08/06/2016 PET scan     IMPRESSION: 1. Overall considerable improvement. The right upper lobe mass is markedly reduced in volume and SUV. Thoracic adenopathy have resolved and the hypermetabolic lesion in the right subscapularis muscle has also resolved. 2. The left upper lobe nodule is no longer appreciably hypermetabolic, and is highly indistinct although this may be due to motion artifact. Faint in indistinct nodularity elsewhere in the lungs likewise not currently hypermetabolic. 3. There is some very faint residual low-grade activity along the subcutaneous deposit along the upper left buttock region. Marked reduction in size and activity of the tumor deposit adjacent to the descending colon. 4. Prior right pleural effusion has resolved. 5. Stable appearance of the adrenal glands, with low-level activity and a nodule in the left adrenal gland. 6. Stable small nodule in the right breast, low-grade metabolic activity with maximum SUV 2.2 (formerly 3.0) 7. Other imaging findings of potential clinical significance: Severe arthropathy of the left glenohumeral joint. Coronary, aortic arch, and branch vessel atherosclerotic vascular disease. Aortoiliac atherosclerotic vascular disease. Prominent prostate gland indents the bladder base.        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  Two adjacent large masslike areas of consolidation within the right upper lobe with differential considerations including malignancy or pneumonia. Multiple enlarged right hilar and mediastinal lymph nodes which may be reactive or metastatic in etiology.  Additional indeterminate pulmonary nodules as above. Recommend attention on follow-up.  Indeterminate 2.9 cm left adrenal nodule. This needs dedicated evaluation with pre and post contrast-enhanced CT or MRI after confirmation of  pulmonary process.  Indeterminate nodule within the right breast. Recommend dedicated evaluation with mammography.  Hepatic steatosis.      04/02/2016 Procedure    Ultrasound-guided core biopsy performed of a solid 1.5 cm soft tissue nodule in the right chest wall.      04/04/2016 Pathology Results    Diagnosis Soft Tissue Needle Core Biopsy, Right Chest Wall SMOOTH MUSCLE NEOPLASM Microscopic Comment The differential diagnosis include low grade leiomyosarcoma. The neoplasm shows spindle cell morphology with rare mitotic figures and minimal atypia, no necrosis present. These cells stains positive for smooth muscle actin, desmin, negative for cytokeratin AE1&3, ck8/18, s100, cd117, cd 34 and cd99. This case also reviewed by Dr. [name] and agree.         HPI Elements   Location: RUL lung  Quality: Adenocarcinoma  Severity: Stage IV  Duration: Dx in November 2017  Context: PDL1- high 50%  Timing:   Modifying Factors: Simultaneous low-grade leiomyosarcoma of right chest wall.  PE at time of Dx.  Associated Signs & Symptoms:    He is doing well with treatment.  He does have some bilateral lower extremity edema.  He reported to the ED on 11/01/2016 with gross hematuria.  Imaging was performed at that time that was negative for any obvious cause of hematuria.  Patient reports that it resolved within 48 hours following ED visit.  He was recommended to follow-up with urology but he has not done so since resolution of signs/symptoms.  Review of Systems  Constitutional:  Negative.  Negative for chills, fever and weight loss.  HENT: Negative.   Eyes: Negative.   Respiratory: Negative.  Negative for cough.   Cardiovascular: Positive for leg swelling. Negative for chest pain.  Gastrointestinal: Negative.  Negative for blood in stool, constipation, diarrhea, melena, nausea and vomiting.  Genitourinary: Positive for hematuria.  Musculoskeletal: Negative.   Skin: Negative.     Neurological: Negative.  Negative for weakness.  Endo/Heme/Allergies: Negative.   Psychiatric/Behavioral: Negative.     Past Medical History:  Diagnosis Date  . Adrenal mass, left ([place]) 04/01/2016  . Diabetes mellitus ([place]) 05/02/2016  . Diabetes mellitus without complication ([place])   . Hypertension   . Mass of breast, right 04/01/2016  . Mass of upper lobe of right lung 04/01/2016   2 adjacent, 5 cm masses, hilar adenopathy  . Primary cancer of right upper lobe of lung ([place]) 04/01/2016   2 adjacent, 5 cm masses, hilar adenopathy  . Pulmonary embolus ([place]) 03/31/2016  . Spindle cell sarcoma [facility])     Past Surgical History:  Procedure Laterality Date  . PORTACATH PLACEMENT Left 05/13/2016   Procedure: INSERTION PORT-A-CATH;  Surgeon: Aviva Signs, MD;  Location: [facility];  Service: General;  Laterality: Left;    No family history on file.  Social History   Social History  . Marital status: Divorced    Spouse name: N/A  . Number of children: N/A  . Years of education: N/A   Social History Main Topics  . Smoking status: Current Every Day Smoker    Years: 61.00    Types: E-cigarettes    Last attempt to quit: 04/11/2014  . Smokeless tobacco: Never Used     Comment: patient does vape smoking x 2 years  . Alcohol use No     Comment: quit 10 years  . Drug use: No  . Sexual activity: Not on file   Other Topics Concern  . Not on file   Social History Narrative  . No narrative on file     PHYSICAL EXAMINATION  ECOG PERFORMANCE STATUS: 1 - Symptomatic but completely ambulatory  There were no vitals filed for this visit.  Vitals - 1 value per visit 7/61/6073  SYSTOLIC 710  DIASTOLIC 85  Pulse 79  Temperature 97.7  Respirations 16  Weight (lb) 229.4    GENERAL:alert, no distress, well nourished, well developed, comfortable, cooperative, obese, smiling and chronically ill appearing, in chemo-recliner, accompanied by daughter. SKIN: skin color, texture, turgor are  normal, no rashes or significant lesions HEAD: Normocephalic, No masses, lesions, tenderness or abnormalities EYES: normal, EOMI, Conjunctiva are pink and non-injected EARS: External ears normal OROPHARYNX:lips, buccal mucosa, and tongue normal and mucous membranes are moist  NECK: supple, trachea midline LYMPH:  no palpable lymphadenopathy BREAST:not examined LUNGS: clear to auscultation , decreased breath sounds HEART: regular rate & rhythm, no murmurs and no gallops ABDOMEN:abdomen soft, non-tender, obese and normal bowel sounds BACK: Back symmetric, no curvature. EXTREMITIES:less then 2 second capillary refill, no joint deformities, effusion, or inflammation, no skin discoloration, no cyanosis, positive findings:  edema B/L 2+ pitting L>R.  No erythema, heat, or tenderness to palpation bilaterally in lower extremities. NEURO: alert & oriented x 3 with fluent speech, no focal motor/sensory deficits, gait normal   LABORATORY DATA: CBC    Component Value Date/Time   WBC 10.0 11/25/2016 0926   RBC 4.95 11/25/2016 0926   HGB 14.4 11/25/2016 0926   HGB 13.5 05/02/2016 0852   HCT 43.1 11/25/2016 0926  HCT 41.0 05/02/2016 0852   PLT 274 11/25/2016 0926   PLT 436 (H) 05/02/2016 0852   MCV 87.1 11/25/2016 0926   MCV 85.7 05/02/2016 0852   MCH 29.1 11/25/2016 0926   MCHC 33.4 11/25/2016 0926   RDW 14.3 11/25/2016 0926   RDW 13.9 05/02/2016 0852   LYMPHSABS 3.7 11/25/2016 0926   LYMPHSABS 3.4 (H) 05/02/2016 0852   MONOABS 0.9 11/25/2016 0926   MONOABS 1.2 (H) 05/02/2016 0852   EOSABS 0.5 11/25/2016 0926   EOSABS 1.6 (H) 05/02/2016 0852   BASOSABS 0.1 11/25/2016 0926   BASOSABS 0.2 (H) 05/02/2016 0852      Chemistry      Component Value Date/Time   NA 138 11/04/2016 0900   NA 139 05/02/2016 0852   K 3.9 11/04/2016 0900   K 4.2 05/02/2016 0852   CL 103 11/04/2016 0900   CO2 27 11/04/2016 0900   CO2 24 05/02/2016 0852   BUN 14 11/04/2016 0900   BUN 10.6 05/02/2016 0852    CREATININE 1.05 11/04/2016 0900   CREATININE 1.0 05/02/2016 0852      Component Value Date/Time   CALCIUM 9.0 11/04/2016 0900   CALCIUM 9.2 05/02/2016 0852   ALKPHOS 81 11/04/2016 0900   ALKPHOS 73 05/02/2016 0852   AST 16 11/04/2016 0900   AST 12 05/02/2016 0852   ALT 15 (L) 11/04/2016 0900   ALT 14 05/02/2016 0852   BILITOT 0.6 11/04/2016 0900   BILITOT 0.42 05/02/2016 0852     Lab Results  Component Value Date   TSH 3.453 11/04/2016    PENDING LABS:   RADIOGRAPHIC STUDIES:  Ct Abdomen Pelvis W Contrast  Result Date: 11/01/2016 CLINICAL DATA:  Painless gross hematuria since earlier this morning history of right upper lobe lung cancer EXAM: CT ABDOMEN AND PELVIS WITH CONTRAST TECHNIQUE: Multidetector CT imaging of the abdomen and pelvis was performed using the standard protocol following bolus administration of intravenous contrast. CONTRAST:  165m ISOVUE-300 IOPAMIDOL (ISOVUE-300) INJECTION 61% COMPARISON:  08/06/2016, at 11:26 2017 FINDINGS: Lower chest: Stable persistent right breast nodule subcutaneously measuring 18 mm, image 5. Mild cardiomegaly. No pericardial or pleural effusion. Minor dependent bibasilar atelectasis. Hepatobiliary: No focal liver abnormality is seen. No gallstones, gallbladder wall thickening, or biliary dilatation. Pancreas: Unremarkable. No pancreatic ductal dilatation or surrounding inflammatory changes. Spleen: Spleen is normal in size. Scattered punctate splenic granulomata. Accessory splenule noted inferiorly. Adrenals/Urinary Tract: Stable indeterminate 3.1 x 1.7 cm left adrenal nodule. Slight thickening of the right adrenal gland. Kidneys demonstrate no acute obstructive uropathy, hydronephrosis, or associated obstructing ureteral calculus. Ureters are symmetric and decompressed. Urinary bladder unremarkable. Stomach/Bowel: Negative for bowel obstruction, significant dilatation, ileus, or free air. Normal appendix demonstrated. Scattered colonic  diverticulosis. No acute inflammatory process. No fluid collection or abscess. Small incidental duodenal diverticulum noted, image 42. Vascular/Lymphatic: Extensive aortoiliac atherosclerosis. Negative for aneurysm or occlusive process. No dissection or retroperitoneal hemorrhage. Mesenteric and renal vasculature appear patent. Reproductive: Symmetric seminal vesicles. Prostate gland is enlarged impressing upon the bladder base with a heterogeneous appearance. Other: Fat containing umbilical hernia present. Negative for ascites. Musculoskeletal: Degenerative changes of the spine. No acute osseous finding or compression fracture. IMPRESSION: No acute intra-abdominal or pelvic finding by CT.  Stable exam. Stable indeterminate 3.1 cm left adrenal nodule. Aortoiliac atherosclerosis without aneurysm Fat containing umbilical hernia Diverticulosis without acute inflammation or obstruction Prostate enlargement 18 mm right breast/chest subcutaneous nodule. This was previously biopsy 04/02/2016. Correlate with pathology. Electronically Signed   By: MJerilynn Mages  Shick M.D.   On: 11/01/2016 11:43     PATHOLOGY:    ASSESSMENT AND PLAN:  Primary cancer of right upper lobe of lung (HCC) Stage IV (T3N2M1B) adenocarcinoma of RUL, mediastinal lymphadenopathy, descending mesocolon (not biopsied- ?synchronous colon cancer?), subcutaneous (L upper buttock, R subscapularis muscle), and L adrenal mets.  Stage IV NOT biopsy proven.  PDL1- high expressor at 50%. AND Low-grade leiomyosarcoma of R chest wall, biopsy proven on 04/02/2016 in the setting of PET scan concerning for metastatic NSCLC.  Stable on most recent staging scans. AND PE, RML, small central filling defect.  On Xarelto anticoagulation.  Due for next CT angio chest in November 2018.  Pre-treatment labs today: CBC diff, CMET, TSH.  I personally reviewed and went over laboratory results with the patient.  The results are noted within this dictation.  Labs satisfy  treatment parameters today.  Proceed with cycle #10 today.  Nursing, in accordance with immunotherapy administration protocol, will monitor for acute side effects/toxicities associated with immunotherapy administration today.  Patient wanted to review his imaging performed recently.  I personally reviewed and went over radiographic studies with the patient.  The results are noted within this dictation.  I personally reviewed the images in PACS.  CT of abdomen and pelvis was negative for any cause of gross hematuria.  No new findings.  No evidence of progressive disease.  Restaging scans due following cycle #12.  Patient is disinterested in having PET imaging in [place].  If PET imaging is modality of restaging needed, then maybe we can delay restaging imaging until mid-September when PET is available at [facility].  I have refilled his Lasix and Spironolactone for leg edema.  I have increased Lasix dose to 40 mg.  I have given a 10 day supply of each.  Next evaluation of PE will be due in November 2018.  Return in 3 weeks for follow-up, cycle #11, and ordering of restaging scans.  ORDERS PLACED FOR THIS ENCOUNTER: No orders of the defined types were placed in this encounter.   MEDICATIONS PRESCRIBED THIS ENCOUNTER: Meds ordered this encounter  Medications  . furosemide (LASIX) 20 MG tablet    Sig: Take 2 tablets (40 mg total) by mouth daily.    Dispense:  20 tablet    Refill:  0    Order Specific Question:   Supervising Provider    Answer:   Brunetta Genera [[id number]]  . spironolactone (ALDACTONE) 50 MG tablet    Sig: Take 1 tablet (50 mg total) by mouth daily.    Dispense:  10 tablet    Refill:  0    Order Specific Question:   Supervising Provider    Answer:   Brunetta Genera [[id number]]    THERAPY PLAN:  Continue with treatment as planned.  All questions were answered. The patient knows to call the clinic with any problems, questions or concerns. We can certainly see the  patient much sooner if necessary.  Patient and plan discussed with Dr. Twana First and she is in agreement with the aforementioned.   This note is electronically signed by: Doy Mince 11/25/2016 10:05 AM

## 2016-11-25 NOTE — Patient Instructions (Signed)
[facility] Discharge Instructions for Patients Receiving Chemotherapy   Beginning January 23rd 2017 lab work for the [facility] will be done in the  Main lab at [facility] on 1st floor. If you have a lab appointment with the [facility] please come in thru the  Main Entrance and check in at the main information desk   Today you received the following chemotherapy agents Keytruda. Follow-up as scheduled. Call clinic for any questions or concerns  To help prevent nausea and vomiting after your treatment, we encourage you to take your nausea medication   If you develop nausea and vomiting, or diarrhea that is not controlled by your medication, call the clinic.  The clinic phone number is [phone]. Office hours are Monday-Friday 8:30am-5:00pm.  BELOW ARE SYMPTOMS THAT SHOULD BE REPORTED IMMEDIATELY:  *FEVER GREATER THAN 101.0 F  *CHILLS WITH OR WITHOUT FEVER  NAUSEA AND VOMITING THAT IS NOT CONTROLLED WITH YOUR NAUSEA MEDICATION  *UNUSUAL SHORTNESS OF BREATH  *UNUSUAL BRUISING OR BLEEDING  TENDERNESS IN MOUTH AND THROAT WITH OR WITHOUT PRESENCE OF ULCERS  *URINARY PROBLEMS  *BOWEL PROBLEMS  UNUSUAL RASH Items with * indicate a potential emergency and should be followed up as soon as possible. If you have an emergency after office hours please contact your primary care physician or go to the nearest emergency department.  Please call the clinic during office hours if you have any questions or concerns.   You may also contact the Patient Navigator at [phone] should you have any questions or need assistance in obtaining follow up care.      Resources For Cancer Patients and their Caregivers ? American Cancer Society: Can assist with transportation, wigs, general needs, runs Look Good Feel Better.        234 315 4608 ? Cancer Care: Provides financial assistance, online support groups, medication/co-pay assistance.  1-800-813-HOPE  819-051-1979) ? [place] Assists [place] cancer patients and their families through emotional , educational and financial support.  (607)527-4372 ? [place] DSS Where to apply for food stamps, Medicaid and utility assistance. 6190971692 ? RCATS: Transportation to medical appointments. 757-084-2137 ? Social Security Administration: May apply for disability if have a Stage IV cancer. 670-082-0259 (854) 108-6042 ? [employer], Disability and Transit Services: Assists with nutrition, care and transit needs. ([phone]

## 2016-11-25 NOTE — Progress Notes (Signed)
Darrell Christensen tolerated Keytruda infusion well without complaints or incident. Labs reviewed prior to administering this medication. VSS upon discharge. Pt discharged self ambulatory in satisfactory condition accompanied by his daughter

## 2016-11-25 NOTE — Assessment & Plan Note (Addendum)
Stage IV (T3N2M1B) adenocarcinoma of RUL, mediastinal lymphadenopathy, descending mesocolon (not biopsied- ?synchronous colon cancer?), subcutaneous (L upper buttock, R subscapularis muscle), and L adrenal mets.  Stage IV NOT biopsy proven.  PDL1- high expressor at 50%. AND Low-grade leiomyosarcoma of R chest wall, biopsy proven on 04/02/2016 in the setting of PET scan concerning for metastatic NSCLC.  Stable on most recent staging scans. AND PE, RML, small central filling defect.  On Xarelto anticoagulation.  Due for next CT angio chest in November 2018.  Pre-treatment labs today: CBC diff, CMET, TSH.  I personally reviewed and went over laboratory results with the patient.  The results are noted within this dictation.  Labs satisfy treatment parameters today.  Proceed with cycle #10 today.  Nursing, in accordance with immunotherapy administration protocol, will monitor for acute side effects/toxicities associated with immunotherapy administration today.  Patient wanted to review his imaging performed recently.  I personally reviewed and went over radiographic studies with the patient.  The results are noted within this dictation.  I personally reviewed the images in PACS.  CT of abdomen and pelvis was negative for any cause of gross hematuria.  No new findings.  No evidence of progressive disease.  Restaging scans due following cycle #12.  Patient is disinterested in having PET imaging in [place].  If PET imaging is modality of restaging needed, then maybe we can delay restaging imaging until mid-September when PET is available at [facility].  I have refilled his Lasix and Spironolactone for leg edema.  I have increased Lasix dose to 40 mg.  I have given a 10 day supply of each.  Next evaluation of PE will be due in November 2018.  Return in 3 weeks for follow-up, cycle #11, and ordering of restaging scans.

## 2016-11-25 NOTE — Patient Instructions (Signed)
[place] at [facility] Discharge Instructions  RECOMMENDATIONS MADE BY THE CONSULTANT AND ANY TEST RESULTS WILL BE SENT TO YOUR REFERRING PHYSICIAN.  Treatment today  Labs in 3 weeks  Return in 3 weeks for follow up  For the fluid in your legs - please take the Furosemide and Spironolactone prescribed by Gershon Mussel  Thank you for choosing [place] at [facility] to provide your oncology and hematology care.  To afford each patient quality time with our provider, please arrive at least 15 minutes before your scheduled appointment time.    If you have a lab appointment with the [facility] please come in thru the  Main Entrance and check in at the main information desk  You need to re-schedule your appointment should you arrive 10 or more minutes late.  We strive to give you quality time with our providers, and arriving late affects you and other patients whose appointments are after yours.  Also, if you no show three or more times for appointments you may be dismissed from the clinic at the providers discretion.     Again, thank you for choosing [facility].  Our hope is that these requests will decrease the amount of time that you wait before being seen by our physicians.       _____________________________________________________________  Should you have questions after your visit to [facility], please contact our office at [phone] between the hours of 8:30 a.m. and 4:30 p.m.  Voicemails left after 4:30 p.m. will not be returned until the following business day.  For prescription refill requests, have your pharmacy contact our office.       Resources For Cancer Patients and their Caregivers ? American Cancer Society: Can assist with transportation, wigs, general needs, runs Look Good Feel Better.        607-023-5018 ? Cancer Care: Provides financial assistance, online support groups, medication/co-pay  assistance.  1-800-813-HOPE 936 793 7626) ? [place] Assists [place] cancer patients and their families through emotional , educational and financial support.  308-352-2207 ? [place] DSS Where to apply for food stamps, Medicaid and utility assistance. 228-442-3326 ? [facility]: Transportation to medical appointments. 509 405 4735 ? Social Security Administration: May apply for disability if have a Stage IV cancer. 208 363 4460 867-746-2946 ? [employer], Disability and Transit Services: Assists with nutrition, care and transit needs. [place] Support Programs: @10RELATIVEDAYS @ > Cancer Support Group  2nd Tuesday of the month 1pm-2pm, Journey Room  > Creative Journey  3rd Tuesday of the month 1130am-1pm, Journey Room  > Look Good Feel Better  1st Wednesday of the month 10am-12 noon, Journey Room (Call Laura to register [phone])

## 2016-12-16 ENCOUNTER — Encounter (HOSPITAL_COMMUNITY): Payer: Self-pay

## 2016-12-16 ENCOUNTER — Encounter (HOSPITAL_COMMUNITY): Payer: Medicare Other | Attending: Oncology

## 2016-12-16 ENCOUNTER — Encounter (HOSPITAL_BASED_OUTPATIENT_CLINIC_OR_DEPARTMENT_OTHER): Payer: Medicare Other | Admitting: Oncology

## 2016-12-16 VITALS — BP 124/80 | HR 68 | Temp 97.6°F | Resp 18 | Wt 231.0 lb

## 2016-12-16 DIAGNOSIS — C7989 Secondary malignant neoplasm of other specified sites: Secondary | ICD-10-CM

## 2016-12-16 DIAGNOSIS — Z5112 Encounter for antineoplastic immunotherapy: Secondary | ICD-10-CM | POA: Diagnosis not present

## 2016-12-16 DIAGNOSIS — C498 Malignant neoplasm of overlapping sites of connective and soft tissue: Secondary | ICD-10-CM

## 2016-12-16 DIAGNOSIS — C3411 Malignant neoplasm of upper lobe, right bronchus or lung: Secondary | ICD-10-CM | POA: Diagnosis not present

## 2016-12-16 DIAGNOSIS — Z86711 Personal history of pulmonary embolism: Secondary | ICD-10-CM

## 2016-12-16 DIAGNOSIS — Z7901 Long term (current) use of anticoagulants: Secondary | ICD-10-CM

## 2016-12-16 LAB — CBC WITH DIFFERENTIAL/PLATELET
Basophils Absolute: 0.1 10*3/uL (ref 0.0–0.1)
Basophils Relative: 1 %
Eosinophils Absolute: 0.4 10*3/uL (ref 0.0–0.7)
Eosinophils Relative: 4 %
HCT: 42.2 % (ref 39.0–52.0)
Hemoglobin: 14.5 g/dL (ref 13.0–17.0)
Lymphocytes Relative: 46 %
Lymphs Abs: 5 10*3/uL — ABNORMAL HIGH (ref 0.7–4.0)
MCH: 29.7 pg (ref 26.0–34.0)
MCHC: 34.4 g/dL (ref 30.0–36.0)
MCV: 86.3 fL (ref 78.0–100.0)
Monocytes Absolute: 0.8 10*3/uL (ref 0.1–1.0)
Monocytes Relative: 8 %
Neutro Abs: 4.4 10*3/uL (ref 1.7–7.7)
Neutrophils Relative %: 41 %
Platelets: 266 10*3/uL (ref 150–400)
RBC: 4.89 MIL/uL (ref 4.22–5.81)
RDW: 13.5 % (ref 11.5–15.5)
WBC: 10.7 10*3/uL — ABNORMAL HIGH (ref 4.0–10.5)

## 2016-12-16 LAB — COMPREHENSIVE METABOLIC PANEL
ALT: 20 U/L (ref 17–63)
AST: 18 U/L (ref 15–41)
Albumin: 4 g/dL (ref 3.5–5.0)
Alkaline Phosphatase: 90 U/L (ref 38–126)
Anion gap: 10 (ref 5–15)
BUN: 11 mg/dL (ref 6–20)
CO2: 24 mmol/L (ref 22–32)
Calcium: 9.1 mg/dL (ref 8.9–10.3)
Chloride: 103 mmol/L (ref 101–111)
Creatinine, Ser: 1.07 mg/dL (ref 0.61–1.24)
GFR calc Af Amer: >60 mL/min (ref >60)
GFR calc non Af Amer: >60 mL/min (ref >60)
Glucose, Bld: 140 mg/dL — ABNORMAL HIGH (ref 65–99)
Potassium: 3.7 mmol/L (ref 3.5–5.1)
Sodium: 137 mmol/L (ref 135–145)
Total Bilirubin: 0.5 mg/dL (ref 0.3–1.2)
Total Protein: 6.9 g/dL (ref 6.5–8.1)

## 2016-12-16 LAB — TSH: TSH: 2.865 u[IU]/mL (ref 0.350–4.500)

## 2016-12-16 MED ORDER — HEPARIN SOD (PORK) LOCK FLUSH 100 UNIT/ML IV SOLN
500.0000 [IU] | Freq: Once | INTRAVENOUS | Status: AC | PRN
  Administered 2016-12-16: 500 [IU]

## 2016-12-16 MED ORDER — PEMBROLIZUMAB CHEMO INJECTION 100 MG/4ML
200.0000 mg | Freq: Once | INTRAVENOUS | Status: AC
  Administered 2016-12-16: 200 mg via INTRAVENOUS
  Filled 2016-12-16: qty 8

## 2016-12-16 MED ORDER — SODIUM CHLORIDE 0.9 % IV SOLN
Freq: Once | INTRAVENOUS | Status: AC
  Administered 2016-12-16: 10:00:00 via INTRAVENOUS

## 2016-12-16 NOTE — Progress Notes (Signed)
Darrell Christensen, [place] [id number]  No diagnosis found.  CURRENT THERAPY: Keytruda beginning on 05/17/2016 and Xarelto anticoagulation  INTERVAL HISTORY: Darrell Christensen 71 y.o. male returns for followup of Stage IV (T3N2M1B) adenocarcinoma of RUL, mediastinal lymphadenopathy, descending mesocolon (not biopsied- ?synchronous colon cancer?), subcutaneous (L upper buttock, R subscapularis muscle), and L adrenal mets.  Stage IV NOT biopsy proven.  PDL1- high expressor at 50%. AND Low-grade leiomyosarcoma of R chest wall, biopsy proven on 04/02/2016 in the setting of PET scan concerning for metastatic NSCLC. AND PE, RML, small central filling defect.  On Xarelto anticoagulation.  Due for next CT angio chest in November 2018.    Primary cancer of right upper lobe of lung ([place])   05/14/2015 Procedure    Port placement by Dr. Arnoldo Morale      04/01/2016 Initial Diagnosis    Primary cancer of right upper lobe of lung ([place])     04/02/2016 Procedure    Ultrasound-guided core biopsy performed of a solid 1.5 cm soft tissue nodule in the right chest wall.      04/03/2016 Imaging    MRI brain- No acute and intracranial process or metastasis.  Moderate chronic small vessel ischemic disease.      04/04/2016 Pathology Results    Diagnosis Soft Tissue Needle Core Biopsy, Right Chest Wall SMOOTH MUSCLE NEOPLASM Microscopic Comment The differential diagnosis include low grade leiomyosarcoma. The neoplasm shows spindle cell morphology with rare mitotic figures and minimal atypia, no necrosis present. These cells stains positive for smooth muscle actin, desmin, negative for cytokeratin AE1&3, ck8/18, s100, cd117, cd 34 and cd99. This case also reviewed by Dr. [name] and agree.      04/05/2016 Procedure    CT-guided biopsy of right upper lobe lesion, with tissue specimen sent to pathology, by IR.      04/09/2016 Pathology Results    Lung, needle/core  biopsy(ies), Right Upper Lobe - NON SMALL CELL CARCINOMA.      04/17/2016 Pathology Results    PDL1 High Expression.  TPS 50%.      04/24/2016 PET scan    1. Prominently hypermetabolic large right upper lobe mass with metastatic disease to the right paratracheal and right hilar nodal chains, to the descending mesocolon, to the left upper buttock subcutaneous tissues, to the left upper lobe, to the right subscapularis muscle, and possibly to the left adrenal gland and right breast subcutaneous tissues. Appearance compatible with stage IV lung cancer. 2. There is some focal high activity in the ascending colon which is probably from peristalsis, less likely from a local colon mass. This does raise the possibility that the adjacent mesocolon tumor implant could be due to synchronous colon cancer rather than lung metastasis. 3. Other imaging findings of potential clinical significance: Coronary, aortic arch, and branch vessel atherosclerotic vascular disease. Aortoiliac atherosclerotic vascular disease. Deformity left proximal humerus and left glenohumeral joint from prior trauma. Enlarged prostate gland.      04/26/2016 Pathology Results    FoundationONE: Genomic alterations identified- KRAS F64P, CREBBP splice site [id number], LRP1B S515*, CZY60 Y30*, ZSW10 splice site [id number]>UR, SPTA1 splice site [id number]+6C>[id number], TP53 C135Y.  Additional findings- MS-Stable, TMB-intermediate (14 Muts/Mb).  No reportable alterations- EGFR, ALK, BRAF, MET, ERBB2, RET, ROS1.      05/17/2016 -  Chemotherapy    The patient had ONDANSETRON IVPB [facility] +/- DEXAMETHASONE, , Intravenous,  Once, 0 of 1 cycle  pembrolizumab (KEYTRUDA) 200 mg in  sodium chloride 0.9 % 50 mL chemo infusion, 200 mg, Intravenous, Once, 0 of 5 cycles  for chemotherapy treatment.        08/06/2016 PET scan    IMPRESSION: 1. Overall considerable improvement. The right upper lobe mass is markedly reduced in volume and SUV. Thoracic  adenopathy have resolved and the hypermetabolic lesion in the right subscapularis muscle has also resolved. 2. The left upper lobe nodule is no longer appreciably hypermetabolic, and is highly indistinct although this may be due to motion artifact. Faint in indistinct nodularity elsewhere in the lungs likewise not currently hypermetabolic. 3. There is some very faint residual low-grade activity along the subcutaneous deposit along the upper left buttock region. Marked reduction in size and activity of the tumor deposit adjacent to the descending colon. 4. Prior right pleural effusion has resolved. 5. Stable appearance of the adrenal glands, with low-level activity and a nodule in the left adrenal gland. 6. Stable small nodule in the right breast, low-grade metabolic activity with maximum SUV 2.2 (formerly 3.0) 7. Other imaging findings of potential clinical significance: Severe arthropathy of the left glenohumeral joint. Coronary, aortic arch, and branch vessel atherosclerotic vascular disease. Aortoiliac atherosclerotic vascular disease. Prominent prostate gland indents the bladder base.        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  Two adjacent large masslike areas of consolidation within the right upper lobe with differential considerations including malignancy or pneumonia. Multiple enlarged right hilar and mediastinal lymph nodes which may be reactive or metastatic in etiology.  Additional indeterminate pulmonary nodules as above. Recommend attention on follow-up.  Indeterminate 2.9 cm left adrenal nodule. This needs dedicated evaluation with pre and post contrast-enhanced CT or MRI after confirmation of pulmonary process.  Indeterminate nodule within the right breast. Recommend dedicated evaluation with mammography.  Hepatic  steatosis.      04/02/2016 Procedure    Ultrasound-guided core biopsy performed of a solid 1.5 cm soft tissue nodule in the right chest wall.      04/04/2016 Pathology Results    Diagnosis Soft Tissue Needle Core Biopsy, Right Chest Wall SMOOTH MUSCLE NEOPLASM Microscopic Comment The differential diagnosis include low grade leiomyosarcoma. The neoplasm shows spindle cell morphology with rare mitotic figures and minimal atypia, no necrosis present. These cells stains positive for smooth muscle actin, desmin, negative for cytokeratin AE1&3, ck8/18, s100, cd117, cd 34 and cd99. This case also reviewed by Dr. [name] and agree.        Patient is here for continued follow up and [place] treatment. He has been tolerating [place] well without any side effects. Denies any SOB, diarrhea, chest pain,abdominal pain. Appetite is good. No weight loss.  Review of Systems  Constitutional: Negative.  Negative for chills, fever and weight loss.  HENT: Negative.   Eyes: Negative.   Respiratory: Negative.  Negative for cough.   Cardiovascular: Negative for chest pain and leg swelling.  Gastrointestinal: Negative.  Negative for blood in stool, constipation, diarrhea, melena, nausea and vomiting.  Genitourinary: Negative for hematuria.  Musculoskeletal: Negative.   Skin: Negative.   Neurological: Negative.  Negative for weakness.  Endo/Heme/Allergies: Negative.   Psychiatric/Behavioral: Negative.     Past Medical History:  Diagnosis Date  . Adrenal mass, left ([place]) 04/01/2016  . Diabetes mellitus ([place]) 05/02/2016  . Diabetes mellitus without complication ([place])   . Hypertension   . Mass  of breast, right 04/01/2016  . Mass of upper lobe of right lung 04/01/2016   2 adjacent, 5 cm masses, hilar adenopathy  . Primary cancer of right upper lobe of lung ([place]) 04/01/2016   2 adjacent, 5 cm masses, hilar adenopathy  . Pulmonary embolus ([place]) 03/31/2016  . 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 PORTACATH PLACEMENT Left 05/13/2016   Procedure: INSERTION PORT-A-CATH;  Surgeon: Aviva Signs, MD;  Location: [facility];  Service: General;  Laterality: Left;    History reviewed. No pertinent family history.  Social History   Social History  . Marital status: Divorced    Spouse name: N/A  . Number of children: N/A  . Years of education: N/A   Social History Main Topics  . Smoking status: Current Every Day Smoker    Years: 61.00    Types: E-cigarettes    Last attempt to quit: 04/11/2014  . Smokeless tobacco: Never Used     Comment: patient does vape smoking x 2 years  . Alcohol use No     Comment: quit 10 years  . Drug use: No  . Sexual activity: Not Asked   Other Topics Concern  . None   Social History Narrative  . None     PHYSICAL EXAMINATION  ECOG PERFORMANCE STATUS: 1 - Symptomatic but completely ambulatory  Vitals from today reviewed.  Constitutional: Well-developed, well-nourished, and in no distress.   HENT:  Head: Normocephalic and atraumatic.  Mouth/Throat: No oropharyngeal exudate. Mucosa moist. Eyes: Pupils are equal, round, and reactive to light. Conjunctivae are normal. No scleral icterus.  Neck: Normal range of motion. Neck supple. No JVD present.  Cardiovascular: Normal rate, regular rhythm and normal heart sounds.  Exam reveals no gallop and no friction rub.   No murmur heard. Pulmonary/Chest: Effort normal and breath sounds normal. No respiratory distress. No wheezes.No rales.  Abdominal: Soft. Bowel sounds are normal. No distension. There is no tenderness. There is no guarding.  Musculoskeletal: No edema or tenderness.  Lymphadenopathy:    No cervical or supraclavicular adenopathy.  Neurological: Alert and oriented to person, place, and time. No cranial nerve deficit.  Skin: Skin is warm and dry. No rash noted. No erythema. No pallor.  Psychiatric: Affect and judgment normal.     LABORATORY DATA: CBC    Component Value Date/Time   WBC 10.7 (H) 12/16/2016 0916   RBC 4.89 12/16/2016 0916   HGB 14.5 12/16/2016 0916   HGB 13.5 05/02/2016 0852   HCT 42.2 12/16/2016 0916   HCT 41.0 05/02/2016 0852   PLT 266 12/16/2016 0916   PLT 436 (H) 05/02/2016 0852   MCV 86.3 12/16/2016 0916   MCV 85.7 05/02/2016 0852   MCH 29.7 12/16/2016 0916   MCHC 34.4 12/16/2016 0916   RDW 13.5 12/16/2016 0916   RDW 13.9 05/02/2016 0852   LYMPHSABS 5.0 (H) 12/16/2016 0916   LYMPHSABS 3.4 (H) 05/02/2016 0852   MONOABS 0.8 12/16/2016 0916   MONOABS 1.2 (H) 05/02/2016 0852   EOSABS 0.4 12/16/2016 0916   EOSABS 1.6 (H) 05/02/2016 0852   BASOSABS 0.1 12/16/2016 0916   BASOSABS 0.2 (H) 05/02/2016 0852      Chemistry      Component Value Date/Time   NA 138 11/25/2016 0926   NA 139 05/02/2016 0852   K 3.9 11/25/2016 0926   K 4.2 05/02/2016 0852   CL 103 11/25/2016 0926   CO2 26 11/25/2016 0926  CO2 24 05/02/2016 0852   BUN 16 11/25/2016 0926   BUN 10.6 05/02/2016 0852   CREATININE 1.06 11/25/2016 0926   CREATININE 1.0 05/02/2016 0852      Component Value Date/Time   CALCIUM 9.1 11/25/2016 0926   CALCIUM 9.2 05/02/2016 0852   ALKPHOS 82 11/25/2016 0926   ALKPHOS 73 05/02/2016 0852   AST 16 11/25/2016 0926   AST 12 05/02/2016 0852   ALT 16 (L) 11/25/2016 0926   ALT 14 05/02/2016 0852   BILITOT 0.4 11/25/2016 0926   BILITOT 0.42 05/02/2016 0852     Lab Results  Component Value Date   TSH 2.899 11/25/2016    PENDING LABS:   RADIOGRAPHIC STUDIES:  No results found.   PATHOLOGY:    ASSESSMENT AND PLAN:  Stage IV (T3N2M1B) adenocarcinoma of RUL, mediastinal lymphadenopathy, descending mesocolon (not biopsied- ?synchronous colon cancer?), subcutaneous (L upper buttock, R subscapularis muscle), and L adrenal mets.  Stage IV NOT biopsy proven.  PDL1- high expressor at 50%. AND Low-grade leiomyosarcoma of R chest wall, biopsy proven on 04/02/2016 in the  setting of PET scan concerning for metastatic NSCLC.  Stable on most recent staging scans. AND PE, RML, small central filling defect.  On Xarelto anticoagulation.  Due for next CT angio chest in November 2018.  Continue [place]. Plan to restage after cycle 12. RTC in 3 weeks for follow up.  THERAPY PLAN:  Continue with treatment as planned.  All questions were answered. The patient knows to call the clinic with any problems, questions or concerns. We can certainly see the patient much sooner if necessary.  This note is electronically signed by: Twana First, MD 12/16/2016 9:41 AM

## 2016-12-16 NOTE — Progress Notes (Signed)
Tolerated infusion w/o adverse reaction.  Alert, in no distress.  VSS.  Discharged ambulatory.  

## 2016-12-23 ENCOUNTER — Other Ambulatory Visit (HOSPITAL_COMMUNITY): Payer: Self-pay | Admitting: Oncology

## 2016-12-23 DIAGNOSIS — C3411 Malignant neoplasm of upper lobe, right bronchus or lung: Secondary | ICD-10-CM

## 2017-01-06 NOTE — Progress Notes (Signed)
[name], [name] [address]   CLINIC:  Medical Oncology/Hematology  PCP:  Celene Squibb, MD [place] [address] [phone]   REASON FOR VISIT:  Follow-up for Stage IV NSCLC AND right chest wall leiomyosarcoma AND PE  CURRENT THERAPY: Keytruda every 21 days AND Observation AND Xarelto daily    BRIEF ONCOLOGIC HISTORY:    Primary cancer of right upper lobe of lung ([place])   05/14/2015 Procedure    Port placement by Dr. Arnoldo Morale      04/01/2016 Initial Diagnosis    Primary cancer of right upper lobe of lung ([place])     04/02/2016 Procedure    Ultrasound-guided core biopsy performed of a solid 1.5 cm soft tissue nodule in the right chest wall.      04/03/2016 Imaging    MRI brain- No acute and intracranial process or metastasis.  Moderate chronic small vessel ischemic disease.      04/04/2016 Pathology Results    Diagnosis Soft Tissue Needle Core Biopsy, Right Chest Wall SMOOTH MUSCLE NEOPLASM Microscopic Comment The differential diagnosis include low grade leiomyosarcoma. The neoplasm shows spindle cell morphology with rare mitotic figures and minimal atypia, no necrosis present. These cells stains positive for smooth muscle actin, desmin, negative for cytokeratin AE1&3, ck8/18, s100, cd117, cd 34 and cd99. This case also reviewed by Dr. [name] and agree.      04/05/2016 Procedure    CT-guided biopsy of right upper lobe lesion, with tissue specimen sent to pathology, by IR.      04/09/2016 Pathology Results    Lung, needle/core biopsy(ies), Right Upper Lobe - NON SMALL CELL CARCINOMA.      04/17/2016 Pathology Results    PDL1 High Expression.  TPS 50%.      04/24/2016 PET scan    1. Prominently hypermetabolic large right upper lobe mass with metastatic disease to the right paratracheal and right hilar nodal chains, to the descending mesocolon, to the left upper buttock subcutaneous tissues, to the  left upper lobe, to the right subscapularis muscle, and possibly to the left adrenal gland and right breast subcutaneous tissues. Appearance compatible with stage IV lung cancer. 2. There is some focal high activity in the ascending colon which is probably from peristalsis, less likely from a local colon mass. This does raise the possibility that the adjacent mesocolon tumor implant could be due to synchronous colon cancer rather than lung metastasis. 3. Other imaging findings of potential clinical significance: Coronary, aortic arch, and branch vessel atherosclerotic vascular disease. Aortoiliac atherosclerotic vascular disease. Deformity left proximal humerus and left glenohumeral joint from prior trauma. Enlarged prostate gland.      04/26/2016 Pathology Results    FoundationONE: Genomic alterations identified- KRAS J57S, CREBBP splice site [id number], LRP1B S515*, ZES92 Z30*, QTM22 splice site [id number], SPTA1 splice site [id number], TP53 C135Y.  Additional findings- MS-Stable, TMB-intermediate (14 Muts/Mb).  No reportable alterations- EGFR, ALK, BRAF, MET, ERBB2, RET, ROS1.      05/17/2016 -  Chemotherapy    The patient had ONDANSETRON IVPB [facility] +/- DEXAMETHASONE, , Intravenous,  Once, 0 of 1 cycle  pembrolizumab (KEYTRUDA) 200 mg in sodium chloride 0.9 % 50 mL chemo infusion, 200 mg, Intravenous, Once, 0 of 5 cycles  for chemotherapy treatment.        08/06/2016 PET scan    IMPRESSION: 1. Overall considerable improvement. The right upper lobe mass is markedly reduced in volume and SUV. Thoracic adenopathy have resolved and  the hypermetabolic lesion in the right subscapularis muscle has also resolved. 2. The left upper lobe nodule is no longer appreciably hypermetabolic, and is highly indistinct although this may be due to motion artifact. Faint in indistinct nodularity elsewhere in the lungs likewise not currently hypermetabolic. 3. There is some very faint residual  low-grade activity along the subcutaneous deposit along the upper left buttock region. Marked reduction in size and activity of the tumor deposit adjacent to the descending colon. 4. Prior right pleural effusion has resolved. 5. Stable appearance of the adrenal glands, with low-level activity and a nodule in the left adrenal gland. 6. Stable small nodule in the right breast, low-grade metabolic activity with maximum SUV 2.2 (formerly 3.0) 7. Other imaging findings of potential clinical significance: Severe arthropathy of the left glenohumeral joint. Coronary, aortic arch, and branch vessel atherosclerotic vascular disease. Aortoiliac atherosclerotic vascular disease. Prominent prostate gland indents the bladder base.        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  Two adjacent large masslike areas of consolidation within the right upper lobe with differential considerations including malignancy or pneumonia. Multiple enlarged right hilar and mediastinal lymph nodes which may be reactive or metastatic in etiology.  Additional indeterminate pulmonary nodules as above. Recommend attention on follow-up.  Indeterminate 2.9 cm left adrenal nodule. This needs dedicated evaluation with pre and post contrast-enhanced CT or MRI after confirmation of pulmonary process.  Indeterminate nodule within the right breast. Recommend dedicated evaluation with mammography.  Hepatic steatosis.      04/02/2016 Procedure    Ultrasound-guided core biopsy performed of a solid 1.5 cm soft tissue nodule in the right chest wall.      04/04/2016 Pathology Results    Diagnosis Soft Tissue Needle Core Biopsy, Right Chest Wall SMOOTH MUSCLE NEOPLASM Microscopic Comment The differential diagnosis include low grade leiomyosarcoma. The neoplasm shows spindle  cell morphology with rare mitotic figures and minimal atypia, no necrosis present. These cells stains positive for smooth muscle actin, desmin, negative for cytokeratin AE1&3, ck8/18, s100, cd117, cd 34 and cd99. This case also reviewed by Dr. [name] and agree.         HISTORY OF PRESENT ILLNESS:  (From Kirby Crigler, PA-C's note on 05/24/16)      INTERVAL HISTORY:  Mr. Manalang 71 y.o. male returns for follow-up for metastatic lung cancer, (R) chest wall leiomyosarcoma, and history of PE.   Due today for next cycle Keytruda.   Overall, he tells me he has been feeling "pretty good."  Appetite 75%; he is supplementing his diet with Ensure 2-3 times/week. Energy levels 50%.  Denies any new focal bone pain. He does report occasional bilateral chest wall pain to his lateral chest, "but it's not too bad and it goes away."  Reports dizziness, "when I bend over too fast." Denies any headaches.  He has chronic leg swelling, which is grossly unchanged; "I take my fluid pill."  Endorses occasional itching, which has also been chronic for him and largely unchanged.  Denies rash, diarrhea, N&V.  Denies any new/worsening shortness of breath or cough. Denies any   He continues to "vape." He has been advised to stop using all nicotine products. He is trying to stop smoking; "I'm down to 3% nicotine on this thing."    When we were discussing the need for scans in the near future, he shares  with me that he is very claustrophobic "and you can't put me in that drum again." (he is referring to MRI machine).     Overall, he feels ready for next dose of Keytruda.     REVIEW OF SYSTEMS:  Review of Systems - Oncology, per HPI    PAST MEDICAL/SURGICAL HISTORY:  Past Medical History:  Diagnosis Date  . Adrenal mass, left ([place]) 04/01/2016  . Diabetes mellitus ([place]) 05/02/2016  . Diabetes mellitus without complication ([place])   . Hypertension   . Mass of breast, right 04/01/2016  . Mass of upper lobe of right  lung 04/01/2016   2 adjacent, 5 cm masses, hilar adenopathy  . Primary cancer of right upper lobe of lung ([place]) 04/01/2016   2 adjacent, 5 cm masses, hilar adenopathy  . Pulmonary embolus ([place]) 03/31/2016  . Spindle cell sarcoma [facility])    Past Surgical History:  Procedure Laterality Date  . PORTACATH PLACEMENT Left 05/13/2016   Procedure: INSERTION PORT-A-CATH;  Surgeon: Aviva Signs, MD;  Location: [facility];  Service: General;  Laterality: Left;     SOCIAL HISTORY:  Social History   Social History  . Marital status: Divorced    Spouse name: N/A  . Number of children: N/A  . Years of education: N/A   Occupational History  . Not on file.   Social History Main Topics  . Smoking status: Current Every Day Smoker    Years: 61.00    Types: E-cigarettes    Last attempt to quit: 04/11/2014  . Smokeless tobacco: Never Used     Comment: patient does vape smoking x 2 years  . Alcohol use No     Comment: quit 10 years  . Drug use: No  . Sexual activity: Not on file   Other Topics Concern  . Not on file   Social History Narrative  . No narrative on file    FAMILY HISTORY:  History reviewed. No pertinent family history.  CURRENT MEDICATIONS:  Outpatient Encounter Prescriptions as of 01/07/2017  Medication Sig Note  . ALPRAZolam (XANAX) 1 MG tablet Take 1 mg by mouth 3 (three) times daily as needed for anxiety.   Marland Kitchen amLODipine (NORVASC) 2.5 MG tablet Take 2.5 mg by mouth daily.   . furosemide (LASIX) 20 MG tablet Take 2 tablets (40 mg total) by mouth daily.   Marland Kitchen GLIPIZIDE XL 5 MG 24 hr tablet Take 5 mg by mouth daily.   . hydrOXYzine (ATARAX/VISTARIL) 25 MG tablet Take 1 tablet (25 mg total) by mouth 3 (three) times daily as needed.   . lidocaine-prilocaine (EMLA) cream Apply a quarter size amount to affected area 1 hour prior to coming to chemotherapy. Cover with plastic wrap.   . metFORMIN (GLUCOPHAGE) 500 MG tablet Take 500 mg by mouth 2 (two) times daily.   . metoprolol succinate  (TOPROL-XL) 25 MG 24 hr tablet TAKE ONE TABLET BY MOUTH IN THE EVENING.   . naproxen (NAPROSYN) 500 MG tablet Take 1 tablet (500 mg total) by mouth 2 (two) times daily with a meal.   . Pembrolizumab (KEYTRUDA IV) Inject into the vein.   . rivaroxaban (XARELTO) 20 MG TABS tablet Take 20 mg by mouth daily with supper. 05/09/2016: On hold due to upcoming procedure  . simvastatin (ZOCOR) 20 MG tablet Take 20 mg by mouth every evening.   . Skin Protectants, Misc. (EUCERIN) cream Apply topically as needed for dry skin.   Marland Kitchen spironolactone (ALDACTONE) 50 MG tablet Take 1 tablet (50  mg total) by mouth daily.   Marland Kitchen HYDROcodone-acetaminophen (NORCO/VICODIN) 5-325 MG tablet Take 1-2 tablets by mouth every 6 (six) hours as needed for moderate pain. (Patient not taking: Reported on 01/07/2017)   . hydrocortisone cream 0.5 % Apply 1 application topically 2 (two) times daily. (Patient not taking: Reported on 01/07/2017)   . ondansetron (ZOFRAN) 8 MG tablet Take 8 mg by mouth every 8 (eight) hours as needed for nausea or vomiting.   Marland Kitchen oxyCODONE-acetaminophen (PERCOCET) 10-325 MG tablet    . predniSONE (DELTASONE) 20 MG tablet Take at the onset of diarrhea.  Take 5 tablets (100 mg) at one time.  Then call the [place]. (Patient not taking: Reported on 01/07/2017)   . prochlorperazine (COMPAZINE) 10 MG tablet  05/13/2016: Received from: External Pharmacy   No facility-administered encounter medications on file as of 01/07/2017.     ALLERGIES:  No Known Allergies   PHYSICAL EXAM:  ECOG Performance status: 1-2 - Symptomatic; requires occasional assistance   Vitals:   01/07/17 0820  BP: 109/63  Pulse: 80  Resp: 20  Temp: (!) 97.3 F (36.3 C)  SpO2: 96%   Filed Weights   01/07/17 0820  Weight: 233 lb (105.7 kg)      Physical Exam  Constitutional: He is oriented to person, place, and time and well-developed, well-nourished, and in no distress.  HENT:  Head: Normocephalic.  Mouth/Throat: Oropharynx is  clear and moist. No oropharyngeal exudate.  Eyes: Pupils are equal, round, and reactive to light. Conjunctivae are normal. No scleral icterus.  Neck: Normal range of motion. Neck supple.  Cardiovascular: Normal rate and regular rhythm.   Pulmonary/Chest: Effort normal.  Diminished breath sounds throughout  Abdominal: Soft. Bowel sounds are normal. There is no tenderness.  Musculoskeletal: Normal range of motion. He exhibits edema (1+ BLE pitting edema ).  Lymphadenopathy:    He has no cervical adenopathy.  Neurological: He is alert and oriented to person, place, and time. No cranial nerve deficit. Gait normal.  Skin: Skin is warm and dry. No rash noted.  Psychiatric: Mood, memory, affect and judgment normal.  Nursing note and vitals reviewed.    LABORATORY DATA:  I have reviewed the labs as listed.  CBC    Component Value Date/Time   WBC 9.0 01/07/2017 0825   RBC 4.91 01/07/2017 0825   HGB 14.5 01/07/2017 0825   HGB 13.5 05/02/2016 0852   HCT 42.7 01/07/2017 0825   HCT 41.0 05/02/2016 0852   PLT 263 01/07/2017 0825   PLT 436 (H) 05/02/2016 0852   MCV 87.0 01/07/2017 0825   MCV 85.7 05/02/2016 0852   MCH 29.5 01/07/2017 0825   MCHC 34.0 01/07/2017 0825   RDW 13.8 01/07/2017 0825   RDW 13.9 05/02/2016 0852   LYMPHSABS 3.8 01/07/2017 0825   LYMPHSABS 3.4 (H) 05/02/2016 0852   MONOABS 0.8 01/07/2017 0825   MONOABS 1.2 (H) 05/02/2016 0852   EOSABS 0.5 01/07/2017 0825   EOSABS 1.6 (H) 05/02/2016 0852   BASOSABS 0.0 01/07/2017 0825   BASOSABS 0.2 (H) 05/02/2016 0852   CMP Latest Ref Rng & Units 01/07/2017 12/16/2016 11/25/2016  Glucose 65 - 99 mg/dL 123(H) 140(H) 135(H)  BUN 6 - 20 mg/dL '8 11 16  ' Creatinine 0.61 - 1.24 mg/dL 1.01 1.07 1.06  Sodium 135 - 145 mmol/L 138 137 138  Potassium 3.5 - 5.1 mmol/L 3.7 3.7 3.9  Chloride 101 - 111 mmol/L 105 103 103  CO2 22 - 32 mmol/L 24 24 26  Calcium 8.9 - 10.3 mg/dL 8.9 9.1 9.1  Total Protein 6.5 - 8.1 g/dL 6.9 6.9 6.8  Total  Bilirubin 0.3 - 1.2 mg/dL 0.4 0.5 0.4  Alkaline Phos 38 - 126 U/L 79 90 82  AST 15 - 41 U/L '19 18 16  ' ALT 17 - 63 U/L 19 20 16(L)    PENDING LABS:    DIAGNOSTIC IMAGING:  *Images reviewed independently and agree with below radiologic reports as listed below.  PET scan: 08/06/16 CLINICAL DATA:  Subsequent treatment strategy for right upper lobe lung cancer.  EXAM: NUCLEAR MEDICINE PET SKULL BASE TO THIGH  TECHNIQUE: 10.8 mCi F-18 FDG was injected intravenously. Full-ring PET imaging was performed from the skull base to thigh after the radiotracer. CT data was obtained and used for attenuation correction and anatomic localization.  FASTING BLOOD GLUCOSE:  Value: 100 mg/dl  COMPARISON:  Multiple exams, including 04/24/2016  FINDINGS: NECK  No hypermetabolic lymph nodes in the neck.  Chronic right maxillary sinusitis.  CHEST  The centrally necrotic right upper lobe mass measures 6.3 by 3.7 cm on image 53/4 (formerly 8.8 by 6.3 cm), with maximum SUV 4.9 (previously 50.3). Right paratracheal lymph node 7 mm in short axis on image 55/4 (formerly 20 mm) an without current hypermetabolic activity. Resolved right hilar and infrahilar nodal activity.  The small previously faintly hypermetabolic right pleural effusion has resolved. Previously faintly metabolic left upper lobe nodule is no longer hypermetabolic, and if this is present on image 24/8 inches blurred and obscured by motion artifact.  Coronary, aortic arch, and branch vessel atherosclerotic vascular disease.  A nodule in the right breast measures 1.6 by 1.3 cm (stable) and has a maximum SUV of 2.2 (formerly 3.0).  ABDOMEN/PELVIS  Essentially stable size of the left adrenal mass, maximum SUV 4.1, previously 5.6.  Physiologic activity in bowel.  The soft tissue density adjacent to the descending colon measures 1.5 by 1.1 cm on image 125/4 (formerly 2.3 by 1.9 cm) with maximum SUV 2.1 (formerly  28.4).  Prominent prostate gland indents the bladder base.  Aortoiliac atherosclerotic vascular disease.  SKELETON  The previous focal hypermetabolic activity in the right subscapularis muscle has completely resolved.  The previous subcutaneous hypermetabolic deposit posteriorly along the left flank is indistinctly marginated and measures about 0.9 by 1.0 cm, with a maximum standard uptake of 1.5 (formerly 16.3).  Severe arthropathy of the left glenohumeral joint.  IMPRESSION: 1. Overall considerable improvement. The right upper lobe mass is markedly reduced in volume and SUV. Thoracic adenopathy have resolved and the hypermetabolic lesion in the right subscapularis muscle has also resolved. 2. The left upper lobe nodule is no longer appreciably hypermetabolic, and is highly indistinct although this may be due to motion artifact. Faint in indistinct nodularity elsewhere in the lungs likewise not currently hypermetabolic. 3. There is some very faint residual low-grade activity along the subcutaneous deposit along the upper left buttock region. Marked reduction in size and activity of the tumor deposit adjacent to the descending colon. 4. Prior right pleural effusion has resolved. 5. Stable appearance of the adrenal glands, with low-level activity and a nodule in the left adrenal gland. 6. Stable small nodule in the right breast, low-grade metabolic activity with maximum SUV 2.2 (formerly 3.0) 7. Other imaging findings of potential clinical significance: Severe arthropathy of the left glenohumeral joint. Coronary, aortic arch, and branch vessel atherosclerotic vascular disease. Aortoiliac atherosclerotic vascular disease. Prominent prostate gland indents the bladder base.   Electronically Signed  By: Van Clines M.D.   On: 08/06/2016 12:19    CT abd/pelvis: 11/01/16 CLINICAL DATA:  Painless gross hematuria since earlier this morning history of right  upper lobe lung cancer  EXAM: CT ABDOMEN AND PELVIS WITH CONTRAST  TECHNIQUE: Multidetector CT imaging of the abdomen and pelvis was performed using the standard protocol following bolus administration of intravenous contrast.  CONTRAST:  188m ISOVUE-300 IOPAMIDOL (ISOVUE-300) INJECTION 61%  COMPARISON:  08/06/2016, at 11:26 2017  FINDINGS: Lower chest: Stable persistent right breast nodule subcutaneously measuring 18 mm, image 5. Mild cardiomegaly. No pericardial or pleural effusion. Minor dependent bibasilar atelectasis.  Hepatobiliary: No focal liver abnormality is seen. No gallstones, gallbladder wall thickening, or biliary dilatation.  Pancreas: Unremarkable. No pancreatic ductal dilatation or surrounding inflammatory changes.  Spleen: Spleen is normal in size. Scattered punctate splenic granulomata. Accessory splenule noted inferiorly.  Adrenals/Urinary Tract: Stable indeterminate 3.1 x 1.7 cm left adrenal nodule. Slight thickening of the right adrenal gland.  Kidneys demonstrate no acute obstructive uropathy, hydronephrosis, or associated obstructing ureteral calculus. Ureters are symmetric and decompressed. Urinary bladder unremarkable.  Stomach/Bowel: Negative for bowel obstruction, significant dilatation, ileus, or free air. Normal appendix demonstrated. Scattered colonic diverticulosis. No acute inflammatory process. No fluid collection or abscess. Small incidental duodenal diverticulum noted, image 42.  Vascular/Lymphatic: Extensive aortoiliac atherosclerosis. Negative for aneurysm or occlusive process. No dissection or retroperitoneal hemorrhage. Mesenteric and renal vasculature appear patent.  Reproductive: Symmetric seminal vesicles. Prostate gland is enlarged impressing upon the bladder base with a heterogeneous appearance.  Other: Fat containing umbilical hernia present. Negative for ascites.  Musculoskeletal: Degenerative changes of  the spine. No acute osseous finding or compression fracture.  IMPRESSION: No acute intra-abdominal or pelvic finding by CT.  Stable exam.  Stable indeterminate 3.1 cm left adrenal nodule.  Aortoiliac atherosclerosis without aneurysm  Fat containing umbilical hernia  Diverticulosis without acute inflammation or obstruction  Prostate enlargement  18 mm right breast/chest subcutaneous nodule. This was previously biopsy 04/02/2016. Correlate with pathology.   Electronically Signed   By: MJerilynn Mages  Shick M.D.   On: 11/01/2016 11:43     PATHOLOGY:  Right chest wall biopsy: 04/02/16   RUL Lung biopsy: 04/05/16        ASSESSMENT & PLAN:   Stage IV NSCLC with multiple sites of metastases:  -He understands that his disease is treatable, but not curable.  -Due for next cycle Keytruda today. He has been tolerating Keytruda well with largely no side effects. Labs reviewed and adequate for treatment.  -Due for restaging imaging with CT chest/abd/pelvis; will make arrangements to coordinate his CTA chest, which is due in 03/2017 with his CT abd/pelvis.   -Continue Keytruda every 3 weeks.  -Return to cancer center in 6 weeks for follow-up visit and treatment.     Right chest wall leiomyosarcoma:  -PET scan in 08/2016 with stable disease and low-grade metabolic activity to right breast nodule.  -He has a really hard time understanding his diagnosis. I tried explaining to him the difference between metastatic lung cancer and leiomyosarcoma, but it is very difficult for him to understand and he gets noticeably frustrated and confused.  Will see what his upcoming restaging scans show and then can discuss further treatment strategy for the (R) chest wall leiomyosarcoma.  No palpable lesions on breast exam today.   History of PE:  -Continue Xarelto daily.  -Due for next CTA chest in 03/2017; orders placed today.  May be able to stop Xarelto if PE has resolved.  Tobacco use  disorder:  -Patient is using e-cigarettes regularly.  Advised him to quit all nicotine containing products, as the nicotine and additives in the liquid of e-cigarettes are likely carcinogenic. Will continue to recommend cessation of all nicotine products.       Dispo:  -Continue Keytruda every 3 weeks as scheduled.  -Return to cancer center in 6 weeks for follow-up visit.  -CTA chest due in 03/2017 to re-assess PE and treatment response for lung cancer; orders placed today.  -Restaging CT abd/pelvis due at that time as well; orders placed.    All questions were answered to patient's stated satisfaction. Encouraged patient to call with any new concerns or questions before his next visit to the cancer center and we can certain see him sooner, if needed.      Orders placed this encounter:  Orders Placed This Encounter  Procedures  . CT ANGIO CHEST PE W OR WO CONTRAST  . CT Abdomen Pelvis W Contrast      Mike Craze, NP [place] [phone]

## 2017-01-07 ENCOUNTER — Encounter (HOSPITAL_COMMUNITY): Payer: Self-pay | Admitting: Adult Health

## 2017-01-07 ENCOUNTER — Encounter (HOSPITAL_COMMUNITY): Payer: Medicare Other | Attending: Oncology

## 2017-01-07 ENCOUNTER — Encounter (HOSPITAL_BASED_OUTPATIENT_CLINIC_OR_DEPARTMENT_OTHER): Payer: Medicare Other | Admitting: Adult Health

## 2017-01-07 VITALS — BP 109/63 | HR 80 | Temp 97.3°F | Resp 20 | Ht 69.5 in | Wt 233.0 lb

## 2017-01-07 VITALS — BP 153/85 | HR 65 | Temp 98.0°F | Resp 20

## 2017-01-07 DIAGNOSIS — C3411 Malignant neoplasm of upper lobe, right bronchus or lung: Secondary | ICD-10-CM | POA: Insufficient documentation

## 2017-01-07 DIAGNOSIS — C498 Malignant neoplasm of overlapping sites of connective and soft tissue: Secondary | ICD-10-CM | POA: Diagnosis not present

## 2017-01-07 DIAGNOSIS — Z5112 Encounter for antineoplastic immunotherapy: Secondary | ICD-10-CM | POA: Diagnosis not present

## 2017-01-07 DIAGNOSIS — Z720 Tobacco use: Secondary | ICD-10-CM | POA: Diagnosis not present

## 2017-01-07 DIAGNOSIS — Z7901 Long term (current) use of anticoagulants: Secondary | ICD-10-CM

## 2017-01-07 DIAGNOSIS — Z86711 Personal history of pulmonary embolism: Secondary | ICD-10-CM | POA: Diagnosis not present

## 2017-01-07 DIAGNOSIS — C7989 Secondary malignant neoplasm of other specified sites: Secondary | ICD-10-CM

## 2017-01-07 DIAGNOSIS — I2692 Saddle embolus of pulmonary artery without acute cor pulmonale: Secondary | ICD-10-CM

## 2017-01-07 LAB — COMPREHENSIVE METABOLIC PANEL
ALT: 19 U/L (ref 17–63)
AST: 19 U/L (ref 15–41)
Albumin: 4.1 g/dL (ref 3.5–5.0)
Alkaline Phosphatase: 79 U/L (ref 38–126)
Anion gap: 9 (ref 5–15)
BUN: 8 mg/dL (ref 6–20)
CO2: 24 mmol/L (ref 22–32)
Calcium: 8.9 mg/dL (ref 8.9–10.3)
Chloride: 105 mmol/L (ref 101–111)
Creatinine, Ser: 1.01 mg/dL (ref 0.61–1.24)
GFR calc Af Amer: >60 mL/min (ref >60)
GFR calc non Af Amer: >60 mL/min (ref >60)
Glucose, Bld: 123 mg/dL — ABNORMAL HIGH (ref 65–99)
Potassium: 3.7 mmol/L (ref 3.5–5.1)
Sodium: 138 mmol/L (ref 135–145)
Total Bilirubin: 0.4 mg/dL (ref 0.3–1.2)
Total Protein: 6.9 g/dL (ref 6.5–8.1)

## 2017-01-07 LAB — CBC WITH DIFFERENTIAL/PLATELET
Basophils Absolute: 0 10*3/uL (ref 0.0–0.1)
Basophils Relative: 0 %
Eosinophils Absolute: 0.5 10*3/uL (ref 0.0–0.7)
Eosinophils Relative: 6 %
HCT: 42.7 % (ref 39.0–52.0)
Hemoglobin: 14.5 g/dL (ref 13.0–17.0)
Lymphocytes Relative: 42 %
Lymphs Abs: 3.8 10*3/uL (ref 0.7–4.0)
MCH: 29.5 pg (ref 26.0–34.0)
MCHC: 34 g/dL (ref 30.0–36.0)
MCV: 87 fL (ref 78.0–100.0)
Monocytes Absolute: 0.8 10*3/uL (ref 0.1–1.0)
Monocytes Relative: 9 %
Neutro Abs: 3.9 10*3/uL (ref 1.7–7.7)
Neutrophils Relative %: 43 %
Platelets: 263 10*3/uL (ref 150–400)
RBC: 4.91 MIL/uL (ref 4.22–5.81)
RDW: 13.8 % (ref 11.5–15.5)
WBC: 9 10*3/uL (ref 4.0–10.5)

## 2017-01-07 LAB — TSH: TSH: 2.328 u[IU]/mL (ref 0.350–4.500)

## 2017-01-07 MED ORDER — SODIUM CHLORIDE 0.9 % IV SOLN
200.0000 mg | Freq: Once | INTRAVENOUS | Status: AC
  Administered 2017-01-07: 200 mg via INTRAVENOUS
  Filled 2017-01-07: qty 8

## 2017-01-07 MED ORDER — SODIUM CHLORIDE 0.9 % IV SOLN
Freq: Once | INTRAVENOUS | Status: AC
  Administered 2017-01-07: 10:00:00 via INTRAVENOUS

## 2017-01-07 MED ORDER — HEPARIN SOD (PORK) LOCK FLUSH 100 UNIT/ML IV SOLN
500.0000 [IU] | Freq: Once | INTRAVENOUS | Status: AC | PRN
  Administered 2017-01-07: 500 [IU]

## 2017-01-07 NOTE — Progress Notes (Signed)
Tolerated tx w/o adverse reaction.  Alert, in no distress.  VSS.  Discharged ambulatory. 

## 2017-01-14 DIAGNOSIS — E1165 Type 2 diabetes mellitus with hyperglycemia: Secondary | ICD-10-CM | POA: Diagnosis not present

## 2017-01-14 DIAGNOSIS — I10 Essential (primary) hypertension: Secondary | ICD-10-CM | POA: Diagnosis not present

## 2017-01-16 DIAGNOSIS — E1165 Type 2 diabetes mellitus with hyperglycemia: Secondary | ICD-10-CM | POA: Diagnosis not present

## 2017-01-16 DIAGNOSIS — E782 Mixed hyperlipidemia: Secondary | ICD-10-CM | POA: Diagnosis not present

## 2017-01-16 DIAGNOSIS — F411 Generalized anxiety disorder: Secondary | ICD-10-CM | POA: Diagnosis not present

## 2017-01-16 DIAGNOSIS — D72829 Elevated white blood cell count, unspecified: Secondary | ICD-10-CM | POA: Diagnosis not present

## 2017-01-16 DIAGNOSIS — I10 Essential (primary) hypertension: Secondary | ICD-10-CM | POA: Diagnosis not present

## 2017-01-21 ENCOUNTER — Other Ambulatory Visit (HOSPITAL_COMMUNITY): Payer: Self-pay | Admitting: Adult Health

## 2017-01-21 DIAGNOSIS — C3411 Malignant neoplasm of upper lobe, right bronchus or lung: Secondary | ICD-10-CM

## 2017-01-23 ENCOUNTER — Telehealth (HOSPITAL_COMMUNITY): Payer: Self-pay | Admitting: *Deleted

## 2017-01-23 NOTE — Telephone Encounter (Signed)
Pt called stating that he wants his pain medication changed. "He is currently taking hydrocodone 5/325mg  and it doesn't help his pain. His head is always hurting and he itches a lot. He wants to know if we can change him to oxycodone's instead. He heard from his friend that oxycodone will help his pain better than hydrocodone"  I spoke with Mike Craze, NP and she advised ,   If his headaches are that bad, he needs MRI of his brain to evaluate for mets. Better understanding what is causing his headaches would help [name] better treat his pain. I don't feel comfortable switching his pain medications without imaging to better understand why he is having more headaches. If you'll give him a call and let me know what he says, I can order MRI brain.   I returned call to patient and he refuses to have MRI of brain.  He states that he doesn't understand why we wont give him anything.  I discussed with patient that we are happy to treat his pain, but we have to be sure with his metastatic disease that it has not spread to his brain at this time.  This will help [name] better treat his pain.  Patient continues to refuse the MRI and states that he will "figure something else out".    I advised Mike Craze, NP of the patients refusal.

## 2017-01-28 ENCOUNTER — Encounter (HOSPITAL_BASED_OUTPATIENT_CLINIC_OR_DEPARTMENT_OTHER): Payer: Medicare Other

## 2017-01-28 ENCOUNTER — Encounter (HOSPITAL_COMMUNITY): Payer: Self-pay

## 2017-01-28 VITALS — BP 152/115 | HR 88 | Temp 98.6°F | Resp 16 | Wt 232.2 lb

## 2017-01-28 DIAGNOSIS — Z5112 Encounter for antineoplastic immunotherapy: Secondary | ICD-10-CM | POA: Diagnosis not present

## 2017-01-28 DIAGNOSIS — C3411 Malignant neoplasm of upper lobe, right bronchus or lung: Secondary | ICD-10-CM

## 2017-01-28 DIAGNOSIS — C7989 Secondary malignant neoplasm of other specified sites: Secondary | ICD-10-CM | POA: Diagnosis not present

## 2017-01-28 LAB — CBC WITH DIFFERENTIAL/PLATELET
Basophils Absolute: 0.1 10*3/uL (ref 0.0–0.1)
Basophils Relative: 1 %
Eosinophils Absolute: 0.7 10*3/uL (ref 0.0–0.7)
Eosinophils Relative: 6 %
HCT: 40.8 % (ref 39.0–52.0)
Hemoglobin: 14.3 g/dL (ref 13.0–17.0)
Lymphocytes Relative: 38 %
Lymphs Abs: 4.1 10*3/uL — ABNORMAL HIGH (ref 0.7–4.0)
MCH: 30.1 pg (ref 26.0–34.0)
MCHC: 35 g/dL (ref 30.0–36.0)
MCV: 85.9 fL (ref 78.0–100.0)
Monocytes Absolute: 1.1 10*3/uL — ABNORMAL HIGH (ref 0.1–1.0)
Monocytes Relative: 10 %
Neutro Abs: 4.9 10*3/uL (ref 1.7–7.7)
Neutrophils Relative %: 45 %
Platelets: 274 10*3/uL (ref 150–400)
RBC: 4.75 MIL/uL (ref 4.22–5.81)
RDW: 13.5 % (ref 11.5–15.5)
WBC: 10.8 10*3/uL — ABNORMAL HIGH (ref 4.0–10.5)

## 2017-01-28 LAB — COMPREHENSIVE METABOLIC PANEL
ALT: 17 U/L (ref 17–63)
AST: 17 U/L (ref 15–41)
Albumin: 3.9 g/dL (ref 3.5–5.0)
Alkaline Phosphatase: 72 U/L (ref 38–126)
Anion gap: 9 (ref 5–15)
BUN: 13 mg/dL (ref 6–20)
CO2: 23 mmol/L (ref 22–32)
Calcium: 8.6 mg/dL — ABNORMAL LOW (ref 8.9–10.3)
Chloride: 107 mmol/L (ref 101–111)
Creatinine, Ser: 1.12 mg/dL (ref 0.61–1.24)
GFR calc Af Amer: >60 mL/min (ref >60)
GFR calc non Af Amer: >60 mL/min (ref >60)
Glucose, Bld: 104 mg/dL — ABNORMAL HIGH (ref 65–99)
Potassium: 3.7 mmol/L (ref 3.5–5.1)
Sodium: 139 mmol/L (ref 135–145)
Total Bilirubin: 0.5 mg/dL (ref 0.3–1.2)
Total Protein: 7 g/dL (ref 6.5–8.1)

## 2017-01-28 LAB — TSH: TSH: 2.615 u[IU]/mL (ref 0.350–4.500)

## 2017-01-28 MED ORDER — PREDNISONE 20 MG PO TABS: ORAL_TABLET | ORAL | 0 refills | Status: DC

## 2017-01-28 MED ORDER — SODIUM CHLORIDE 0.9% FLUSH
10.0000 mL | INTRAVENOUS | Status: DC | PRN
  Administered 2017-01-28: 10 mL
  Filled 2017-01-28: qty 10

## 2017-01-28 MED ORDER — OXYCODONE-ACETAMINOPHEN 10-325 MG PO TABS
1.0000 | ORAL_TABLET | Freq: Four times a day (QID) | ORAL | 0 refills | Status: DC | PRN
Start: 2017-01-28 — End: 2017-03-11

## 2017-01-28 MED ORDER — SODIUM CHLORIDE 0.9 % IV SOLN
Freq: Once | INTRAVENOUS | Status: AC
  Administered 2017-01-28: 11:00:00 via INTRAVENOUS

## 2017-01-28 MED ORDER — PEMBROLIZUMAB CHEMO INJECTION 100 MG/4ML
200.0000 mg | Freq: Once | INTRAVENOUS | Status: AC
  Administered 2017-01-28: 200 mg via INTRAVENOUS
  Filled 2017-01-28: qty 8

## 2017-01-28 MED ORDER — HEPARIN SOD (PORK) LOCK FLUSH 100 UNIT/ML IV SOLN
500.0000 [IU] | Freq: Once | INTRAVENOUS | Status: AC | PRN
  Administered 2017-01-28: 500 [IU]
  Filled 2017-01-28: qty 5

## 2017-01-28 NOTE — Progress Notes (Signed)
Patient presents today with complaints of diarrhea since Friday. Patient contacted his primary care but no return phone call. Patient states that everything he drinks and eats goes right thru him. Stated he was having 4-5 episodes of diarrhea over the weekend. Patient has not been eating or drinking much because of this. Labs drawn and results and complaints reviewed with MD. Proceed with treatment today.   I gave the patient a diarrhea education sheet, hightlighted the directions. I made sure he had both the nurse navigator number and triage nurse number to call if he continued to have issues.   Blood pressure elevated today post treatment. MD aware. Patient states he took his norvasc today but takes other meds at night. Patient discharged from clinic per MD, patient has no complaints at this time. I encouraged patient to recheck his blood pressure once he got home and to make sure he is taking his meds. Patient tolerated infusion well today without problems. Discharged home from clinic ambulatory with daughter. Follow up as scheduled.

## 2017-01-28 NOTE — Patient Instructions (Signed)
[facility] Discharge Instructions for Patients Receiving Chemotherapy   Beginning January 23rd 2017 lab work for the Cancer Center will be done in the  Main lab at  on 1st floor. If you have a lab appointment with the Cancer Center please come in thru the  Main Entrance and check in at the main information desk   Today you received the following chemotherapy agents   To help prevent nausea and vomiting after your treatment, we encourage you to take your nausea medication     If you develop nausea and vomiting, or diarrhea that is not controlled by your medication, call the clinic.  The clinic phone number is [phone]. Office hours are Monday-Friday 8:30am-5:00pm.  BELOW ARE SYMPTOMS THAT SHOULD BE REPORTED IMMEDIATELY:  *FEVER GREATER THAN 101.0 F  *CHILLS WITH OR WITHOUT FEVER  NAUSEA AND VOMITING THAT IS NOT CONTROLLED WITH YOUR NAUSEA MEDICATION  *UNUSUAL SHORTNESS OF BREATH  *UNUSUAL BRUISING OR BLEEDING  TENDERNESS IN MOUTH AND THROAT WITH OR WITHOUT PRESENCE OF ULCERS  *URINARY PROBLEMS  *BOWEL PROBLEMS  UNUSUAL RASH Items with * indicate a potential emergency and should be followed up as soon as possible. If you have an emergency after office hours please contact your primary care physician or go to the nearest emergency department.  Please call the clinic during office hours if you have any questions or concerns.   You may also contact the Patient Navigator at [phone] should you have any questions or need assistance in obtaining follow up care.      Resources For Cancer Patients and their Caregivers ? American Cancer Society: Can assist with transportation, wigs, general needs, runs Look Good Feel Better.        1-888-227-6333 ? Cancer Care: Provides financial assistance, online support groups, medication/co-pay assistance.  1-800-813-HOPE (4673) ? [facility] Assists [place] cancer  patients and their families through emotional , educational and financial support.  336-427-4357 ? [place] DSS Where to apply for food stamps, Medicaid and utility assistance. 336-342-1394 ? [facility]: Transportation to medical appointments. 336-347-2287 ? Social Security Administration: May apply for disability if have a Stage IV cancer. 336-342-7796 1-800-772-1213 ? [place] [facility]ging, Disability and Transit Services: Assists with nutrition, care and transit needs. 336-349-2343         

## 2017-02-17 ENCOUNTER — Other Ambulatory Visit (HOSPITAL_COMMUNITY): Payer: Self-pay | Admitting: Adult Health

## 2017-02-17 DIAGNOSIS — C3411 Malignant neoplasm of upper lobe, right bronchus or lung: Secondary | ICD-10-CM

## 2017-02-18 ENCOUNTER — Encounter (HOSPITAL_COMMUNITY): Payer: Self-pay

## 2017-02-18 ENCOUNTER — Encounter (HOSPITAL_COMMUNITY): Payer: Medicare Other | Attending: Oncology

## 2017-02-18 VITALS — BP 155/72 | HR 68 | Temp 97.5°F | Resp 16 | Wt 227.8 lb

## 2017-02-18 DIAGNOSIS — C7989 Secondary malignant neoplasm of other specified sites: Secondary | ICD-10-CM | POA: Diagnosis not present

## 2017-02-18 DIAGNOSIS — Z5112 Encounter for antineoplastic immunotherapy: Secondary | ICD-10-CM

## 2017-02-18 DIAGNOSIS — C3411 Malignant neoplasm of upper lobe, right bronchus or lung: Secondary | ICD-10-CM | POA: Diagnosis not present

## 2017-02-18 LAB — COMPREHENSIVE METABOLIC PANEL
ALT: 12 U/L — ABNORMAL LOW (ref 17–63)
AST: 16 U/L (ref 15–41)
Albumin: 3.6 g/dL (ref 3.5–5.0)
Alkaline Phosphatase: 81 U/L (ref 38–126)
Anion gap: 11 (ref 5–15)
BUN: 12 mg/dL (ref 6–20)
CO2: 22 mmol/L (ref 22–32)
Calcium: 8.6 mg/dL — ABNORMAL LOW (ref 8.9–10.3)
Chloride: 106 mmol/L (ref 101–111)
Creatinine, Ser: 1.08 mg/dL (ref 0.61–1.24)
GFR calc Af Amer: >60 mL/min (ref >60)
GFR calc non Af Amer: >60 mL/min (ref >60)
Glucose, Bld: 112 mg/dL — ABNORMAL HIGH (ref 65–99)
Potassium: 3.9 mmol/L (ref 3.5–5.1)
Sodium: 139 mmol/L (ref 135–145)
Total Bilirubin: 0.5 mg/dL (ref 0.3–1.2)
Total Protein: 6.7 g/dL (ref 6.5–8.1)

## 2017-02-18 LAB — CBC WITH DIFFERENTIAL/PLATELET
Basophils Absolute: 0.1 10*3/uL (ref 0.0–0.1)
Basophils Relative: 1 %
Eosinophils Absolute: 0.6 10*3/uL (ref 0.0–0.7)
Eosinophils Relative: 6 %
HCT: 41.6 % (ref 39.0–52.0)
Hemoglobin: 13.9 g/dL (ref 13.0–17.0)
Lymphocytes Relative: 40 %
Lymphs Abs: 4 10*3/uL (ref 0.7–4.0)
MCH: 29 pg (ref 26.0–34.0)
MCHC: 33.4 g/dL (ref 30.0–36.0)
MCV: 86.8 fL (ref 78.0–100.0)
Monocytes Absolute: 1 10*3/uL (ref 0.1–1.0)
Monocytes Relative: 10 %
Neutro Abs: 4.4 10*3/uL (ref 1.7–7.7)
Neutrophils Relative %: 43 %
Platelets: 319 10*3/uL (ref 150–400)
RBC: 4.79 MIL/uL (ref 4.22–5.81)
RDW: 13.4 % (ref 11.5–15.5)
WBC: 10.2 10*3/uL (ref 4.0–10.5)

## 2017-02-18 LAB — TSH: TSH: 1.531 u[IU]/mL (ref 0.350–4.500)

## 2017-02-18 MED ORDER — SODIUM CHLORIDE 0.9 % IV SOLN
Freq: Once | INTRAVENOUS | Status: AC
  Administered 2017-02-18: 500 mL via INTRAVENOUS

## 2017-02-18 MED ORDER — HEPARIN SOD (PORK) LOCK FLUSH 100 UNIT/ML IV SOLN
500.0000 [IU] | Freq: Once | INTRAVENOUS | Status: AC | PRN
  Administered 2017-02-18: 500 [IU]
  Filled 2017-02-18: qty 5

## 2017-02-18 MED ORDER — SODIUM CHLORIDE 0.9% FLUSH
10.0000 mL | INTRAVENOUS | Status: DC | PRN
  Administered 2017-02-18: 10 mL
  Filled 2017-02-18: qty 10

## 2017-02-18 MED ORDER — SODIUM CHLORIDE 0.9 % IV SOLN
200.0000 mg | Freq: Once | INTRAVENOUS | Status: AC
  Administered 2017-02-18: 200 mg via INTRAVENOUS
  Filled 2017-02-18: qty 8

## 2017-02-18 NOTE — Progress Notes (Signed)
To treatment area for labs and chemotherapy.  Patient stated right rib cage pain and "all over pain" with fair management with pain medication.  Rates pain 5 today.  Patient refused MRI again for headaches and stated "he has had those before and doesn't need another one".  Family present.  Fair appetite and no complaints of neuropathy.    Reviewed labs and patients complaints and ok to treat today.   Patient tolerated chemotherapy with no complaints voiced. Port site clean and dry with no bruising or swelling noted at site.  VSS with discharge and left ambulatory with family.

## 2017-02-18 NOTE — Patient Instructions (Signed)
[facility] Discharge Instructions for Patients Receiving Chemotherapy  Today you received the following chemotherapy agents keytruda today.   To help prevent nausea and vomiting after your treatment, we encourage you to take your nausea medication.    If you develop nausea and vomiting that is not controlled by your nausea medication, call the clinic.   BELOW ARE SYMPTOMS THAT SHOULD BE REPORTED IMMEDIATELY:  *FEVER GREATER THAN 100.5 F  *CHILLS WITH OR WITHOUT FEVER  NAUSEA AND VOMITING THAT IS NOT CONTROLLED WITH YOUR NAUSEA MEDICATION  *UNUSUAL SHORTNESS OF BREATH  *UNUSUAL BRUISING OR BLEEDING  TENDERNESS IN MOUTH AND THROAT WITH OR WITHOUT PRESENCE OF ULCERS  *URINARY PROBLEMS  *BOWEL PROBLEMS  UNUSUAL RASH Items with * indicate a potential emergency and should be followed up as soon as possible.  Feel free to call the clinic should you have any questions or concerns. The clinic phone number is [phone].  Please show the [place] at check-in to the Emergency Department and triage nurse.

## 2017-02-20 ENCOUNTER — Other Ambulatory Visit (HOSPITAL_COMMUNITY): Payer: Self-pay | Admitting: Oncology

## 2017-03-06 ENCOUNTER — Ambulatory Visit (HOSPITAL_COMMUNITY)
Admission: RE | Admit: 2017-03-06 | Discharge: 2017-03-06 | Disposition: A | Discharge status: Home / Self Care | Payer: Medicare Other | Source: Ambulatory Visit | Attending: Adult Health | Admitting: Adult Health

## 2017-03-06 DIAGNOSIS — I700 Atherosclerosis of aorta: Secondary | ICD-10-CM | POA: Diagnosis not present

## 2017-03-06 DIAGNOSIS — N400 Enlarged prostate without lower urinary tract symptoms: Secondary | ICD-10-CM | POA: Diagnosis not present

## 2017-03-06 DIAGNOSIS — C3411 Malignant neoplasm of upper lobe, right bronchus or lung: Secondary | ICD-10-CM | POA: Insufficient documentation

## 2017-03-06 DIAGNOSIS — E279 Disorder of adrenal gland, unspecified: Secondary | ICD-10-CM | POA: Diagnosis not present

## 2017-03-06 DIAGNOSIS — J432 Centrilobular emphysema: Secondary | ICD-10-CM | POA: Diagnosis not present

## 2017-03-06 DIAGNOSIS — I2692 Saddle embolus of pulmonary artery without acute cor pulmonale: Secondary | ICD-10-CM

## 2017-03-06 DIAGNOSIS — N6310 Unspecified lump in the right breast, unspecified quadrant: Secondary | ICD-10-CM | POA: Diagnosis not present

## 2017-03-06 DIAGNOSIS — R918 Other nonspecific abnormal finding of lung field: Secondary | ICD-10-CM | POA: Diagnosis not present

## 2017-03-06 DIAGNOSIS — C785 Secondary malignant neoplasm of large intestine and rectum: Secondary | ICD-10-CM | POA: Diagnosis not present

## 2017-03-06 DIAGNOSIS — R079 Chest pain, unspecified: Secondary | ICD-10-CM | POA: Diagnosis not present

## 2017-03-06 DIAGNOSIS — I2510 Atherosclerotic heart disease of native coronary artery without angina pectoris: Secondary | ICD-10-CM | POA: Insufficient documentation

## 2017-03-06 DIAGNOSIS — R109 Unspecified abdominal pain: Secondary | ICD-10-CM | POA: Diagnosis not present

## 2017-03-06 MED ORDER — IOPAMIDOL (ISOVUE-370) INJECTION 76%
100.0000 mL | Freq: Once | INTRAVENOUS | Status: AC | PRN
  Administered 2017-03-06: 100 mL via INTRAVENOUS

## 2017-03-11 ENCOUNTER — Encounter (HOSPITAL_BASED_OUTPATIENT_CLINIC_OR_DEPARTMENT_OTHER): Payer: Medicare Other

## 2017-03-11 ENCOUNTER — Encounter (HOSPITAL_COMMUNITY): Payer: Self-pay

## 2017-03-11 ENCOUNTER — Encounter (HOSPITAL_COMMUNITY): Payer: Self-pay | Admitting: Oncology

## 2017-03-11 ENCOUNTER — Encounter (HOSPITAL_COMMUNITY): Payer: Medicare Other | Attending: Oncology | Admitting: Oncology

## 2017-03-11 VITALS — BP 146/84 | HR 76 | Temp 97.4°F | Resp 20 | Wt 231.6 lb

## 2017-03-11 DIAGNOSIS — C498 Malignant neoplasm of overlapping sites of connective and soft tissue: Secondary | ICD-10-CM | POA: Diagnosis not present

## 2017-03-11 DIAGNOSIS — C7989 Secondary malignant neoplasm of other specified sites: Secondary | ICD-10-CM

## 2017-03-11 DIAGNOSIS — C3411 Malignant neoplasm of upper lobe, right bronchus or lung: Secondary | ICD-10-CM

## 2017-03-11 DIAGNOSIS — Z86711 Personal history of pulmonary embolism: Secondary | ICD-10-CM

## 2017-03-11 DIAGNOSIS — Z5112 Encounter for antineoplastic immunotherapy: Secondary | ICD-10-CM

## 2017-03-11 LAB — COMPREHENSIVE METABOLIC PANEL
ALT: 20 U/L (ref 17–63)
AST: 19 U/L (ref 15–41)
Albumin: 3.8 g/dL (ref 3.5–5.0)
Alkaline Phosphatase: 86 U/L (ref 38–126)
Anion gap: 9 (ref 5–15)
BUN: 12 mg/dL (ref 6–20)
CO2: 26 mmol/L (ref 22–32)
Calcium: 8.6 mg/dL — ABNORMAL LOW (ref 8.9–10.3)
Chloride: 103 mmol/L (ref 101–111)
Creatinine, Ser: 0.98 mg/dL (ref 0.61–1.24)
GFR calc Af Amer: >60 mL/min (ref >60)
GFR calc non Af Amer: >60 mL/min (ref >60)
Glucose, Bld: 114 mg/dL — ABNORMAL HIGH (ref 65–99)
Potassium: 3.8 mmol/L (ref 3.5–5.1)
Sodium: 138 mmol/L (ref 135–145)
Total Bilirubin: 0.6 mg/dL (ref 0.3–1.2)
Total Protein: 6.7 g/dL (ref 6.5–8.1)

## 2017-03-11 LAB — CBC WITH DIFFERENTIAL/PLATELET
Basophils Absolute: 0.1 10*3/uL (ref 0.0–0.1)
Basophils Relative: 0 %
Eosinophils Absolute: 0.7 10*3/uL (ref 0.0–0.7)
Eosinophils Relative: 6 %
HCT: 43.4 % (ref 39.0–52.0)
Hemoglobin: 14.4 g/dL (ref 13.0–17.0)
Lymphocytes Relative: 38 %
Lymphs Abs: 4.9 10*3/uL — ABNORMAL HIGH (ref 0.7–4.0)
MCH: 28.6 pg (ref 26.0–34.0)
MCHC: 33.2 g/dL (ref 30.0–36.0)
MCV: 86.3 fL (ref 78.0–100.0)
Monocytes Absolute: 1.2 10*3/uL — ABNORMAL HIGH (ref 0.1–1.0)
Monocytes Relative: 9 %
Neutro Abs: 6 10*3/uL (ref 1.7–7.7)
Neutrophils Relative %: 47 %
Platelets: 270 10*3/uL (ref 150–400)
RBC: 5.03 MIL/uL (ref 4.22–5.81)
RDW: 14.1 % (ref 11.5–15.5)
WBC: 12.9 10*3/uL — ABNORMAL HIGH (ref 4.0–10.5)

## 2017-03-11 LAB — TSH: TSH: 3.614 u[IU]/mL (ref 0.350–4.500)

## 2017-03-11 MED ORDER — HEPARIN SOD (PORK) LOCK FLUSH 100 UNIT/ML IV SOLN
500.0000 [IU] | Freq: Once | INTRAVENOUS | Status: AC | PRN
  Administered 2017-03-11: 500 [IU]

## 2017-03-11 MED ORDER — SODIUM CHLORIDE 0.9 % IV SOLN
Freq: Once | INTRAVENOUS | Status: AC
  Administered 2017-03-11: 11:00:00 via INTRAVENOUS

## 2017-03-11 MED ORDER — DIPHENOXYLATE-ATROPINE 2.5-0.025 MG PO TABS: 1.0000 | ORAL_TABLET | Freq: Four times a day (QID) | ORAL | 0 refills | Status: DC | PRN

## 2017-03-11 MED ORDER — OXYCODONE-ACETAMINOPHEN 10-325 MG PO TABS: 1.0000 | ORAL_TABLET | Freq: Four times a day (QID) | ORAL | 0 refills | Status: DC | PRN

## 2017-03-11 MED ORDER — SODIUM CHLORIDE 0.9% FLUSH
10.0000 mL | INTRAVENOUS | Status: DC | PRN
  Administered 2017-03-11: 10 mL
  Filled 2017-03-11: qty 10

## 2017-03-11 MED ORDER — SODIUM CHLORIDE 0.9 % IV SOLN
200.0000 mg | Freq: Once | INTRAVENOUS | Status: AC
  Administered 2017-03-11: 200 mg via INTRAVENOUS
  Filled 2017-03-11: qty 8

## 2017-03-11 NOTE — Patient Instructions (Signed)
[facility] Discharge Instructions  RECOMMENDATIONS MADE BY THE CONSULTANT AND ANY TEST RESULTS WILL BE SENT TO YOUR REFERRING PHYSICIAN.  You were seen today by Dr. Louise Zhou    Thank you for choosing [facility] to provide your oncology and hematology care.  To afford each patient quality time with our provider, please arrive at least 15 minutes before your scheduled appointment time.    If you have a lab appointment with the Cancer Center please come in thru the  Main Entrance and check in at the main information desk  You need to re-schedule your appointment should you arrive 10 or more minutes late.  We strive to give you quality time with our providers, and arriving late affects you and other patients whose appointments are after yours.  Also, if you no show three or more times for appointments you may be dismissed from the clinic at the providers discretion.     Again, thank you for choosing [facility].  Our hope is that these requests will decrease the amount of time that you wait before being seen by our physicians.       _____________________________________________________________  Should you have questions after your visit to [facility], please contact our office at [phone] between the hours of 8:30 a.m. and 4:30 p.m.  Voicemails left after 4:30 p.m. will not be returned until the following business day.  For prescription refill requests, have your pharmacy contact our office.       Resources For Cancer Patients and their Caregivers ? American Cancer Society: Can assist with transportation, wigs, general needs, runs Look Good Feel Better.        1-888-227-6333 ? Cancer Care: Provides financial assistance, online support groups, medication/co-pay assistance.  1-800-813-HOPE (4673) ? [facility] Assists [place] cancer patients and their  families through emotional , educational and financial support.  336-427-4357 ? [place] DSS Where to apply for food stamps, Medicaid and utility assistance. 336-342-1394 ? [facility]: Transportation to medical appointments. 336-347-2287 ? Social Security Administration: May apply for disability if have a Stage IV cancer. 336-342-7796 1-800-772-1213 ? [place] [facility]ging, Disability and Transit Services: Assists with nutrition, care and transit needs. 336-349-2343  Cancer Center Support Programs: [email or web address]@ > Cancer Support Group  2nd Tuesday of the month 1pm-2pm, Journey Room  > Creative Journey  3rd Tuesday of the month 1130am-1pm, Journey Room  > Look Good Feel Better  1st Wednesday of the month 10am-12 noon, Journey Room (Call [employer] to register 1-800-395-5775)    

## 2017-03-11 NOTE — Progress Notes (Signed)
Darrell Christensen tolerated Keytruda infusion well without complaints or incident. Labs reviewed with Dr. Talbert Cage prior to administering this medication. VSS upon discharge. Pt discharged self ambulatory in satisfactory condition accompanied by his daughter

## 2017-03-11 NOTE — Patient Instructions (Signed)
[facility] Discharge Instructions for Patients Receiving Chemotherapy   Beginning January 23rd 2017 lab work for the [facility] will be done in the  Main lab at [facility] on 1st floor. If you have a lab appointment with the [facility] please come in thru the  Main Entrance and check in at the main information desk   Today you received the following chemotherapy agents Keytruda. Follow-up as scheduled. Call clinic for any questions or concerns  To help prevent nausea and vomiting after your treatment, we encourage you to take your nausea medication   If you develop nausea and vomiting, or diarrhea that is not controlled by your medication, call the clinic.  The clinic phone number is [phone]. Office hours are Monday-Friday 8:30am-5:00pm.  BELOW ARE SYMPTOMS THAT SHOULD BE REPORTED IMMEDIATELY:  *FEVER GREATER THAN 101.0 F  *CHILLS WITH OR WITHOUT FEVER  NAUSEA AND VOMITING THAT IS NOT CONTROLLED WITH YOUR NAUSEA MEDICATION  *UNUSUAL SHORTNESS OF BREATH  *UNUSUAL BRUISING OR BLEEDING  TENDERNESS IN MOUTH AND THROAT WITH OR WITHOUT PRESENCE OF ULCERS  *URINARY PROBLEMS  *BOWEL PROBLEMS  UNUSUAL RASH Items with * indicate a potential emergency and should be followed up as soon as possible. If you have an emergency after office hours please contact your primary care physician or go to the nearest emergency department.  Please call the clinic during office hours if you have any questions or concerns.   You may also contact the Patient Navigator at [phone] should you have any questions or need assistance in obtaining follow up care.      Resources For Cancer Patients and their Caregivers ? American Cancer Society: Can assist with transportation, wigs, general needs, runs Look Good Feel Better.        234 315 4608 ? Cancer Care: Provides financial assistance, online support groups, medication/co-pay assistance.  1-800-813-HOPE  819-051-1979) ? [place] Assists [place] cancer patients and their families through emotional , educational and financial support.  (607)527-4372 ? [place] DSS Where to apply for food stamps, Medicaid and utility assistance. 6190971692 ? RCATS: Transportation to medical appointments. 757-084-2137 ? Social Security Administration: May apply for disability if have a Stage IV cancer. 670-082-0259 (854) 108-6042 ? [employer], Disability and Transit Services: Assists with nutrition, care and transit needs. ([phone]

## 2017-03-11 NOTE — Progress Notes (Signed)
Celene Squibb, MD [address] [place] [address]  Primary cancer of right upper lobe of lung [facility]) - Plan: DISCONTINUED: sodium chloride flush (NS) 0.9 % injection 10 mL, DISCONTINUED: heparin lock flush 100 unit/mL, DISCONTINUED: 0.9 %  sodium chloride infusion, DISCONTINUED: pembrolizumab (KEYTRUDA) 200 mg in sodium chloride 0.9 % 50 mL chemo infusion  CURRENT THERAPY: Keytruda beginning on 05/17/2016 and Xarelto anticoagulation  INTERVAL HISTORY: Ajdin G Parrales 71 y.o. male returns for followup of Stage IV (T3N2M1B) adenocarcinoma of RUL, mediastinal lymphadenopathy, descending mesocolon (not biopsied- ?synchronous colon cancer?), subcutaneous (L upper buttock, R subscapularis muscle), and L adrenal mets.  Stage IV NOT biopsy proven.  PDL1- high expressor at 50%. AND Low-grade leiomyosarcoma of R chest wall, biopsy proven on 04/02/2016 in the setting of PET scan concerning for metastatic NSCLC. AND PE, RML, small central filling defect.  On Xarelto anticoagulation.  Due for next CT angio chest in November 2018.    Primary cancer of right upper lobe of lung ([place])   05/14/2015 Procedure    Port placement by Dr. Arnoldo Morale      04/01/2016 Initial Diagnosis    Primary cancer of right upper lobe of lung ([place])     04/02/2016 Procedure    Ultrasound-guided core biopsy performed of a solid 1.5 cm soft tissue nodule in the right chest wall.      04/03/2016 Imaging    MRI brain- No acute and intracranial process or metastasis.  Moderate chronic small vessel ischemic disease.      04/04/2016 Pathology Results    Diagnosis Soft Tissue Needle Core Biopsy, Right Chest Wall SMOOTH MUSCLE NEOPLASM Microscopic Comment The differential diagnosis include low grade leiomyosarcoma. The neoplasm shows spindle cell morphology with rare mitotic figures and minimal atypia, no necrosis present. These cells stains positive for smooth muscle actin, desmin, negative for cytokeratin  AE1&3, ck8/18, s100, cd117, cd 34 and cd99. This case also reviewed by Dr. [name] and agree.      04/05/2016 Procedure    CT-guided biopsy of right upper lobe lesion, with tissue specimen sent to pathology, by IR.      04/09/2016 Pathology Results    Lung, needle/core biopsy(ies), Right Upper Lobe - NON SMALL CELL CARCINOMA.      04/17/2016 Pathology Results    PDL1 High Expression.  TPS 50%.      04/24/2016 PET scan    1. Prominently hypermetabolic large right upper lobe mass with metastatic disease to the right paratracheal and right hilar nodal chains, to the descending mesocolon, to the left upper buttock subcutaneous tissues, to the left upper lobe, to the right subscapularis muscle, and possibly to the left adrenal gland and right breast subcutaneous tissues. Appearance compatible with stage IV lung cancer. 2. There is some focal high activity in the ascending colon which is probably from peristalsis, less likely from a local colon mass. This does raise the possibility that the adjacent mesocolon tumor implant could be due to synchronous colon cancer rather than lung metastasis. 3. Other imaging findings of potential clinical significance: Coronary, aortic arch, and branch vessel atherosclerotic vascular disease. Aortoiliac atherosclerotic vascular disease. Deformity left proximal humerus and left glenohumeral joint from prior trauma. Enlarged prostate gland.      04/26/2016 Pathology Results    FoundationONE: Genomic alterations identified- KRAS A76O, CREBBP splice site [id number]>[id number], LRP1B S515*, MBT59 R41*, ULA45 splice site [id number], SPTA1 splice site [id number]+3B>[id number], TP53 C135Y.  Additional findings- MS-Stable, TMB-intermediate (14 Muts/Mb).  No reportable alterations- EGFR, ALK, BRAF, MET, ERBB2, RET, ROS1.      05/17/2016 -  Chemotherapy    The patient had ONDANSETRON IVPB [facility] +/- DEXAMETHASONE, , Intravenous,  Once, 0 of 1 cycle  pembrolizumab (KEYTRUDA) 200 mg  in sodium chloride 0.9 % 50 mL chemo infusion, 200 mg, Intravenous, Once, 0 of 5 cycles  for chemotherapy treatment.        08/06/2016 PET scan    IMPRESSION: 1. Overall considerable improvement. The right upper lobe mass is markedly reduced in volume and SUV. Thoracic adenopathy have resolved and the hypermetabolic lesion in the right subscapularis muscle has also resolved. 2. The left upper lobe nodule is no longer appreciably hypermetabolic, and is highly indistinct although this may be due to motion artifact. Faint in indistinct nodularity elsewhere in the lungs likewise not currently hypermetabolic. 3. There is some very faint residual low-grade activity along the subcutaneous deposit along the upper left buttock region. Marked reduction in size and activity of the tumor deposit adjacent to the descending colon. 4. Prior right pleural effusion has resolved. 5. Stable appearance of the adrenal glands, with low-level activity and a nodule in the left adrenal gland. 6. Stable small nodule in the right breast, low-grade metabolic activity with maximum SUV 2.2 (formerly 3.0) 7. Other imaging findings of potential clinical significance: Severe arthropathy of the left glenohumeral joint. Coronary, aortic arch, and branch vessel atherosclerotic vascular disease. Aortoiliac atherosclerotic vascular disease. Prominent prostate gland indents the bladder base.        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  Two adjacent large masslike areas of consolidation within the right upper lobe with differential considerations including malignancy or pneumonia. Multiple enlarged right hilar and mediastinal lymph nodes which may be reactive or metastatic in etiology.  Additional indeterminate pulmonary nodules as above. Recommend attention on  follow-up.  Indeterminate 2.9 cm left adrenal nodule. This needs dedicated evaluation with pre and post contrast-enhanced CT or MRI after confirmation of pulmonary process.  Indeterminate nodule within the right breast. Recommend dedicated evaluation with mammography.  Hepatic steatosis.      04/02/2016 Procedure    Ultrasound-guided core biopsy performed of a solid 1.5 cm soft tissue nodule in the right chest wall.      04/04/2016 Pathology Results    Diagnosis Soft Tissue Needle Core Biopsy, Right Chest Wall SMOOTH MUSCLE NEOPLASM Microscopic Comment The differential diagnosis include low grade leiomyosarcoma. The neoplasm shows spindle cell morphology with rare mitotic figures and minimal atypia, no necrosis present. These cells stains positive for smooth muscle actin, desmin, negative for cytokeratin AE1&3, ck8/18, s100, cd117, cd 34 and cd99. This case also reviewed by Dr. [name] and agree.        Patient is here for continued follow up and [place] treatment. He has been tolerating [place] well without any side effects. He states he has been having a rash on his back for the past few weeks. He has not been able to put hydrocortisone cream on his back because he lives alone. Denies any SOB, diarrhea, chest pain,abdominal pain. Appetite is good. No weight loss.  Review of Systems  Constitutional: Positive for malaise/fatigue. Negative for chills, fever and weight loss.  HENT: Negative.   Eyes: Negative.   Respiratory: Negative.  Negative for cough, shortness of breath and wheezing.   Cardiovascular: Negative for chest pain and  leg swelling.  Gastrointestinal: Negative.  Negative for blood in stool, constipation, diarrhea, melena, nausea and vomiting.  Genitourinary: Negative for hematuria.  Musculoskeletal: Negative.   Skin: Positive for rash (on back).  Neurological: Negative.  Negative for weakness.  Endo/Heme/Allergies: Negative.   Psychiatric/Behavioral:  Negative.     Past Medical History:  Diagnosis Date  . Adrenal mass, left ([place]) 04/01/2016  . Diabetes mellitus ([place]) 05/02/2016  . Diabetes mellitus without complication ([place])   . Hypertension   . Mass of breast, right 04/01/2016  . Mass of upper lobe of right lung 04/01/2016   2 adjacent, 5 cm masses, hilar adenopathy  . Primary cancer of right upper lobe of lung ([place]) 04/01/2016   2 adjacent, 5 cm masses, hilar adenopathy  . Pulmonary embolus ([place]) 03/31/2016  . 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 No pertinent surgical history.  History reviewed. No pertinent family history.  Social History   Socioeconomic History  . Marital status: Divorced    Spouse name: None  . Number of children: None  . Years of education: None  . Highest education level: None  Social Needs  . Financial resource strain: None  . Food insecurity - worry: None  . Food insecurity - inability: None  . Transportation needs - medical: None  . Transportation needs - non-medical: None  Occupational History  . None  Tobacco Use  . Smoking status: Current Every Day Smoker    Years: 61.00    Types: E-cigarettes    Last attempt to quit: 04/11/2014    Years since quitting: 2.9  . Smokeless tobacco: Never Used  . Tobacco comment: patient does vape smoking x 2 years  Substance and Sexual Activity  . Alcohol use: No    Comment: quit 10 years  . Drug use: No  . Sexual activity: None  Other Topics Concern  . None  Social History Narrative  . None     PHYSICAL EXAMINATION  ECOG PERFORMANCE STATUS: 1 - Symptomatic but completely ambulatory  Vitals from today reviewed.  Constitutional: Well-developed, well-nourished, and in no distress.   HENT:  Head: Normocephalic and atraumatic.  Mouth/Throat: No oropharyngeal exudate. Mucosa moist. Eyes: Pupils are equal, round, and reactive to light. Conjunctivae are normal. No scleral icterus.  Neck: Normal range of motion. Neck supple. No JVD  present.  Cardiovascular: Normal rate, regular rhythm and normal heart sounds.  Exam reveals no gallop and no friction rub.   No murmur heard. Pulmonary/Chest: Effort normal and breath sounds normal. No respiratory distress. No wheezes.No rales.  Abdominal: Soft. Bowel sounds are normal. No distension. There is no tenderness. There is no guarding.  Musculoskeletal: No edema or tenderness.  Lymphadenopathy:    No cervical or supraclavicular adenopathy.  Neurological: Alert and oriented to person, place, and time. No cranial nerve deficit.  Skin: Skin is warm and dry. No erythema. No pallor. Mild redness on the back. Psychiatric: Affect and judgment normal.    LABORATORY DATA: CBC    Component Value Date/Time   WBC 12.9 (H) 03/11/2017 0927   RBC 5.03 03/11/2017 0927   HGB 14.4 03/11/2017 0927   HGB 13.5 05/02/2016 0852   HCT 43.4 03/11/2017 0927   HCT 41.0 05/02/2016 0852   PLT 270 03/11/2017 0927   PLT 436 (H) 05/02/2016 0852   MCV 86.3 03/11/2017 0927   MCV 85.7 05/02/2016 0852   MCH 28.6 03/11/2017 0927   MCHC 33.2 03/11/2017 0927   RDW 14.1 03/11/2017 0927  RDW 13.9 05/02/2016 0852   LYMPHSABS 4.9 (H) 03/11/2017 0927   LYMPHSABS 3.4 (H) 05/02/2016 0852   MONOABS 1.2 (H) 03/11/2017 0927   MONOABS 1.2 (H) 05/02/2016 0852   EOSABS 0.7 03/11/2017 0927   EOSABS 1.6 (H) 05/02/2016 0852   BASOSABS 0.1 03/11/2017 0927   BASOSABS 0.2 (H) 05/02/2016 0852      Chemistry      Component Value Date/Time   NA 138 03/11/2017 0927   NA 139 05/02/2016 0852   K 3.8 03/11/2017 0927   K 4.2 05/02/2016 0852   CL 103 03/11/2017 0927   CO2 26 03/11/2017 0927   CO2 24 05/02/2016 0852   BUN 12 03/11/2017 0927   BUN 10.6 05/02/2016 0852   CREATININE 0.98 03/11/2017 0927   CREATININE 1.0 05/02/2016 0852      Component Value Date/Time   CALCIUM 8.6 (L) 03/11/2017 0927   CALCIUM 9.2 05/02/2016 0852   ALKPHOS 86 03/11/2017 0927   ALKPHOS 73 05/02/2016 0852   AST 19 03/11/2017 0927    AST 12 05/02/2016 0852   ALT 20 03/11/2017 0927   ALT 14 05/02/2016 0852   BILITOT 0.6 03/11/2017 0927   BILITOT 0.42 05/02/2016 0852     Lab Results  Component Value Date   TSH 3.614 03/11/2017    PENDING LABS:   RADIOGRAPHIC STUDIES:  Ct Angio Chest Pe W Or Wo Contrast  Result Date: 03/06/2017 CLINICAL DATA:  History of stage IV non-small-cell lung carcinoma of the right upper lobe diagnosed December 2017, presenting for restaging with ongoing chemotherapy. Leiomyosarcoma of the ventral right chest wall diagnosed November 2017. History of pulmonary embolism. EXAM: CT ANGIOGRAPHY CHEST CT ABDOMEN AND PELVIS WITH CONTRAST TECHNIQUE: Multidetector CT imaging of the chest was performed using the standard protocol during bolus administration of intravenous contrast. Multiplanar CT image reconstructions and MIPs were obtained to evaluate the vascular anatomy. Multidetector CT imaging of the abdomen and pelvis was performed using the standard protocol during bolus administration of intravenous contrast. CONTRAST:  100 cc Isovue 370 IV. COMPARISON:  11/01/2016 CT abdomen/ pelvis. 08/06/2016 PET-CT. 03/31/2016 chest CT angiogram. FINDINGS: CTA CHEST FINDINGS Cardiovascular: The study is high quality for the evaluation of pulmonary embolism. There are no filling defects in the central, lobar, segmental or subsegmental pulmonary artery branches to suggest acute pulmonary embolism. Atherosclerotic thoracic aorta with stable ectatic 4.1 cm ascending thoracic aorta. Stable top-normal caliber pulmonary arteries. Stable top-normal heart size. No significant pericardial fluid/thickening. Left anterior descending, left circumflex and right coronary atherosclerosis. Left subclavian MediPort terminates in the upper third of the superior vena cava. Mediastinum/Nodes: Stable hypodense 1.3 cm inferior right thyroid lobe nodule. Unremarkable esophagus. No axillary adenopathy. No pathologically enlarged mediastinal or  hilar lymph nodes. Lungs/Pleura: No pneumothorax. No pleural effusion. Mild centrilobular emphysema with diffuse bronchial wall thickening. No acute consolidative airspace disease. Posterior right upper lobe 5.7 x 2.5 cm lung mass (series 9/ image 51), previously 6.6 x 4.1 cm on 08/06/2016 PET-CT using similar measurement technique, decreased in size. A few scattered solid pulmonary nodules in the right lung are stable to decreased. For example a 7 mm anterior right lower lobe solid pulmonary nodule (series 9/image 70), previously 9 mm, mildly decreased. A 5 mm anterior right upper lobe nodule (series 9/ image 58), previously 5 mm, stable. No new significant pulmonary nodules. Musculoskeletal: No aggressive appearing focal osseous lesions. Subcutaneous 1.6 x 1.3 cm soft tissue mass in the right breast (series 4/image 57), previously 1.6 x 1.3 cm  on 08/06/2016 PET-CT, stable. Review of the MIP images confirms the above findings. CT ABDOMEN and PELVIS FINDINGS Hepatobiliary: Normal liver size. No liver mass. Normal gallbladder with no radiopaque cholelithiasis. No biliary ductal dilatation. Pancreas: Normal, with no mass or duct dilation. Spleen: Normal size spleen. No splenic mass. Stable scattered granulomatous splenic calcifications. Adrenals/Urinary Tract: Stable 1.2 cm right adrenal nodule (series 5/ image 27). Stable 2.8 cm left adrenal nodule (series 5/image 28). No hydronephrosis. Subcentimeter hypodense renal cortical lesions in the upper left kidney, too small to characterize, unchanged, probably benign renal cysts. No new renal lesions. Normal bladder. Stomach/Bowel: Grossly normal stomach. Normal caliber small bowel with no small bowel wall thickening. Normal appendix. Normal large bowel with no diverticulosis, large bowel wall thickening or pericolonic fat stranding. Vascular/Lymphatic: Atherosclerotic nonaneurysmal abdominal aorta. Patent portal, splenic, hepatic and renal veins. No pathologically  enlarged lymph nodes in the abdomen or pelvis. Reproductive: Mildly enlarged prostate, stable. Other: No pneumoperitoneum, ascites or focal fluid collection. Stable small fat containing periumbilical hernia. Tiny 0.4 cm soft tissue nodule medial to the descending colon (series 5/image 48), previously 0.8 cm, decreased. Previously noted 1.0 cm deep subcutaneous upper left buttock lesion on 08/06/2016 PET-CT study is absent on today's scan. Musculoskeletal: No aggressive appearing focal osseous lesions. Mild lumbar spondylosis. Review of the MIP images confirms the above findings. IMPRESSION: 1. No evidence of pulmonary embolism. 2. Primary malignancy in the posterior right upper lobe is decreased in size. 3. Scattered pulmonary nodules are stable to decreased in size. 4. No pathologically enlarged lymph nodes in the chest, abdomen or pelvis. 5. Indeterminate bilateral adrenal nodules are stable. 6. Continued reduction in the size of the metastasis medial to the descending colon. 7. Previously visualized deep subcutaneous metastasis in the upper left buttock region is absent on today's scan. 8. No new or progressive metastatic disease. 9. Stable small soft tissue mass in the subcutaneous right breast compatible with biopsy-proven low-grade leiomyosarcoma. 10. Chronic findings include: Aortic Atherosclerosis (ICD10-I70.0) and Emphysema (ICD10-J43.9). Three-vessel coronary atherosclerosis. Mild prostatomegaly. Electronically Signed   By: Ilona Sorrel M.D.   On: 03/06/2017 12:15   Ct Abdomen Pelvis W Contrast  Result Date: 03/06/2017 CLINICAL DATA:  History of stage IV non-small-cell lung carcinoma of the right upper lobe diagnosed December 2017, presenting for restaging with ongoing chemotherapy. Leiomyosarcoma of the ventral right chest wall diagnosed November 2017. History of pulmonary embolism. EXAM: CT ANGIOGRAPHY CHEST CT ABDOMEN AND PELVIS WITH CONTRAST TECHNIQUE: Multidetector CT imaging of the chest was  performed using the standard protocol during bolus administration of intravenous contrast. Multiplanar CT image reconstructions and MIPs were obtained to evaluate the vascular anatomy. Multidetector CT imaging of the abdomen and pelvis was performed using the standard protocol during bolus administration of intravenous contrast. CONTRAST:  100 cc Isovue 370 IV. COMPARISON:  11/01/2016 CT abdomen/ pelvis. 08/06/2016 PET-CT. 03/31/2016 chest CT angiogram. FINDINGS: CTA CHEST FINDINGS Cardiovascular: The study is high quality for the evaluation of pulmonary embolism. There are no filling defects in the central, lobar, segmental or subsegmental pulmonary artery branches to suggest acute pulmonary embolism. Atherosclerotic thoracic aorta with stable ectatic 4.1 cm ascending thoracic aorta. Stable top-normal caliber pulmonary arteries. Stable top-normal heart size. No significant pericardial fluid/thickening. Left anterior descending, left circumflex and right coronary atherosclerosis. Left subclavian MediPort terminates in the upper third of the superior vena cava. Mediastinum/Nodes: Stable hypodense 1.3 cm inferior right thyroid lobe nodule. Unremarkable esophagus. No axillary adenopathy. No pathologically enlarged mediastinal or hilar lymph nodes. Lungs/Pleura:  No pneumothorax. No pleural effusion. Mild centrilobular emphysema with diffuse bronchial wall thickening. No acute consolidative airspace disease. Posterior right upper lobe 5.7 x 2.5 cm lung mass (series 9/ image 51), previously 6.6 x 4.1 cm on 08/06/2016 PET-CT using similar measurement technique, decreased in size. A few scattered solid pulmonary nodules in the right lung are stable to decreased. For example a 7 mm anterior right lower lobe solid pulmonary nodule (series 9/image 70), previously 9 mm, mildly decreased. A 5 mm anterior right upper lobe nodule (series 9/ image 58), previously 5 mm, stable. No new significant pulmonary nodules. Musculoskeletal:  No aggressive appearing focal osseous lesions. Subcutaneous 1.6 x 1.3 cm soft tissue mass in the right breast (series 4/image 57), previously 1.6 x 1.3 cm on 08/06/2016 PET-CT, stable. Review of the MIP images confirms the above findings. CT ABDOMEN and PELVIS FINDINGS Hepatobiliary: Normal liver size. No liver mass. Normal gallbladder with no radiopaque cholelithiasis. No biliary ductal dilatation. Pancreas: Normal, with no mass or duct dilation. Spleen: Normal size spleen. No splenic mass. Stable scattered granulomatous splenic calcifications. Adrenals/Urinary Tract: Stable 1.2 cm right adrenal nodule (series 5/ image 27). Stable 2.8 cm left adrenal nodule (series 5/image 28). No hydronephrosis. Subcentimeter hypodense renal cortical lesions in the upper left kidney, too small to characterize, unchanged, probably benign renal cysts. No new renal lesions. Normal bladder. Stomach/Bowel: Grossly normal stomach. Normal caliber small bowel with no small bowel wall thickening. Normal appendix. Normal large bowel with no diverticulosis, large bowel wall thickening or pericolonic fat stranding. Vascular/Lymphatic: Atherosclerotic nonaneurysmal abdominal aorta. Patent portal, splenic, hepatic and renal veins. No pathologically enlarged lymph nodes in the abdomen or pelvis. Reproductive: Mildly enlarged prostate, stable. Other: No pneumoperitoneum, ascites or focal fluid collection. Stable small fat containing periumbilical hernia. Tiny 0.4 cm soft tissue nodule medial to the descending colon (series 5/image 48), previously 0.8 cm, decreased. Previously noted 1.0 cm deep subcutaneous upper left buttock lesion on 08/06/2016 PET-CT study is absent on today's scan. Musculoskeletal: No aggressive appearing focal osseous lesions. Mild lumbar spondylosis. Review of the MIP images confirms the above findings. IMPRESSION: 1. No evidence of pulmonary embolism. 2. Primary malignancy in the posterior right upper lobe is decreased in  size. 3. Scattered pulmonary nodules are stable to decreased in size. 4. No pathologically enlarged lymph nodes in the chest, abdomen or pelvis. 5. Indeterminate bilateral adrenal nodules are stable. 6. Continued reduction in the size of the metastasis medial to the descending colon. 7. Previously visualized deep subcutaneous metastasis in the upper left buttock region is absent on today's scan. 8. No new or progressive metastatic disease. 9. Stable small soft tissue mass in the subcutaneous right breast compatible with biopsy-proven low-grade leiomyosarcoma. 10. Chronic findings include: Aortic Atherosclerosis (ICD10-I70.0) and Emphysema (ICD10-J43.9). Three-vessel coronary atherosclerosis. Mild prostatomegaly. Electronically Signed   By: Ilona Sorrel M.D.   On: 03/06/2017 12:15     PATHOLOGY:    ASSESSMENT AND PLAN:  Stage IV (T3N2M1B) adenocarcinoma of RUL, mediastinal lymphadenopathy, descending mesocolon (not biopsied- ?synchronous colon cancer?), subcutaneous (L upper buttock, R subscapularis muscle), and L adrenal mets.  Stage IV NOT biopsy proven.  PDL1- high expressor at 50%. AND Low-grade leiomyosarcoma of R chest wall, biopsy proven on 04/02/2016 in the setting of PET scan concerning for metastatic NSCLC.  Stable on most recent staging scans. AND PE, RML, small central filling defect.  On Xarelto anticoagulation.   Continue keytruda q3weeks.  Labs reviewed today ok to proceed with [place] today. Reviewed his restaging  CT C/A/P with him in detail. He has decrease in size of his primary mass as well as decrease in his other pulmonary nodules. No adenopathy or evidence of new metastatic disease.  PE has resolved on CT chest so I have told patient he can discontinue the xarelto. RTC in 3 weeks for follow up and next [facility] treatment.  THERAPY PLAN:  Continue with treatment as planned.  All questions were answered. The patient knows to call the clinic with any problems, questions or  concerns. We can certainly see the patient much sooner if necessary.  This note is electronically signed by: Twana First, MD 03/11/2017 12:21 PM

## 2017-03-25 ENCOUNTER — Other Ambulatory Visit (HOSPITAL_COMMUNITY): Payer: Self-pay | Admitting: Adult Health

## 2017-03-25 DIAGNOSIS — C3411 Malignant neoplasm of upper lobe, right bronchus or lung: Secondary | ICD-10-CM

## 2017-04-01 ENCOUNTER — Encounter (HOSPITAL_BASED_OUTPATIENT_CLINIC_OR_DEPARTMENT_OTHER): Payer: Medicare Other

## 2017-04-01 ENCOUNTER — Encounter (HOSPITAL_COMMUNITY): Payer: Self-pay

## 2017-04-01 ENCOUNTER — Encounter (HOSPITAL_BASED_OUTPATIENT_CLINIC_OR_DEPARTMENT_OTHER): Payer: Medicare Other | Admitting: Oncology

## 2017-04-01 ENCOUNTER — Other Ambulatory Visit: Payer: Self-pay

## 2017-04-01 VITALS — BP 158/89 | HR 59 | Resp 18

## 2017-04-01 VITALS — BP 160/82 | HR 70 | Resp 20 | Ht 69.0 in | Wt 234.5 lb

## 2017-04-01 DIAGNOSIS — C3411 Malignant neoplasm of upper lobe, right bronchus or lung: Secondary | ICD-10-CM | POA: Diagnosis not present

## 2017-04-01 DIAGNOSIS — C498 Malignant neoplasm of overlapping sites of connective and soft tissue: Secondary | ICD-10-CM | POA: Diagnosis not present

## 2017-04-01 DIAGNOSIS — Z86711 Personal history of pulmonary embolism: Secondary | ICD-10-CM

## 2017-04-01 DIAGNOSIS — Z5112 Encounter for antineoplastic immunotherapy: Secondary | ICD-10-CM

## 2017-04-01 DIAGNOSIS — C7989 Secondary malignant neoplasm of other specified sites: Secondary | ICD-10-CM

## 2017-04-01 LAB — COMPREHENSIVE METABOLIC PANEL
ALT: 20 U/L (ref 17–63)
AST: 17 U/L (ref 15–41)
Albumin: 3.9 g/dL (ref 3.5–5.0)
Alkaline Phosphatase: 75 U/L (ref 38–126)
Anion gap: 7 (ref 5–15)
BUN: 16 mg/dL (ref 6–20)
CO2: 25 mmol/L (ref 22–32)
Calcium: 8.9 mg/dL (ref 8.9–10.3)
Chloride: 104 mmol/L (ref 101–111)
Creatinine, Ser: 1.11 mg/dL (ref 0.61–1.24)
GFR calc Af Amer: >60 mL/min (ref >60)
GFR calc non Af Amer: >60 mL/min (ref >60)
Glucose, Bld: 112 mg/dL — ABNORMAL HIGH (ref 65–99)
Potassium: 3.7 mmol/L (ref 3.5–5.1)
Sodium: 136 mmol/L (ref 135–145)
Total Bilirubin: 0.6 mg/dL (ref 0.3–1.2)
Total Protein: 6.6 g/dL (ref 6.5–8.1)

## 2017-04-01 LAB — CBC WITH DIFFERENTIAL/PLATELET
Basophils Absolute: 0 10*3/uL (ref 0.0–0.1)
Basophils Relative: 0 %
Eosinophils Absolute: 0.5 10*3/uL (ref 0.0–0.7)
Eosinophils Relative: 5 %
HCT: 42.5 % (ref 39.0–52.0)
Hemoglobin: 13.7 g/dL (ref 13.0–17.0)
Lymphocytes Relative: 37 %
Lymphs Abs: 3.9 10*3/uL (ref 0.7–4.0)
MCH: 28.8 pg (ref 26.0–34.0)
MCHC: 32.2 g/dL (ref 30.0–36.0)
MCV: 89.5 fL (ref 78.0–100.0)
Monocytes Absolute: 1 10*3/uL (ref 0.1–1.0)
Monocytes Relative: 9 %
Neutro Abs: 5.2 10*3/uL (ref 1.7–7.7)
Neutrophils Relative %: 49 %
Platelets: 266 10*3/uL (ref 150–400)
RBC: 4.75 MIL/uL (ref 4.22–5.81)
RDW: 14.3 % (ref 11.5–15.5)
WBC: 10.6 10*3/uL — ABNORMAL HIGH (ref 4.0–10.5)

## 2017-04-01 LAB — TSH: TSH: 2.347 u[IU]/mL (ref 0.350–4.500)

## 2017-04-01 MED ORDER — PEMBROLIZUMAB CHEMO INJECTION 100 MG/4ML
200.0000 mg | Freq: Once | INTRAVENOUS | Status: AC
  Administered 2017-04-01: 200 mg via INTRAVENOUS
  Filled 2017-04-01: qty 8

## 2017-04-01 MED ORDER — SODIUM CHLORIDE 0.9% FLUSH
10.0000 mL | INTRAVENOUS | Status: DC | PRN
  Administered 2017-04-01: 10 mL
  Filled 2017-04-01: qty 10

## 2017-04-01 MED ORDER — SODIUM CHLORIDE 0.9 % IV SOLN
Freq: Once | INTRAVENOUS | Status: AC
Start: 2017-04-01 — End: 2017-04-01
  Administered 2017-04-01: 10:00:00 via INTRAVENOUS

## 2017-04-01 MED ORDER — HEPARIN SOD (PORK) LOCK FLUSH 100 UNIT/ML IV SOLN
500.0000 [IU] | Freq: Once | INTRAVENOUS | Status: AC | PRN
  Administered 2017-04-01: 500 [IU]
  Filled 2017-04-01: qty 5

## 2017-04-01 NOTE — Progress Notes (Signed)
Labs reviewed with MD, proceed with treatment.  Treatment given per orders. Patient tolerated it well without problems. Vitals stable and discharged home from clinic ambulatory. Follow up as scheduled.

## 2017-04-01 NOTE — Patient Instructions (Signed)
[facility] Discharge Instructions for Patients Receiving Chemotherapy   Beginning January 23rd 2017 lab work for the Cancer Center will be done in the  Main lab at [place] on 1st floor. If you have a lab appointment with the Cancer Center please come in thru the  Main Entrance and check in at the main information desk   Today you received the following chemotherapy agents   To help prevent nausea and vomiting after your treatment, we encourage you to take your nausea medication     If you develop nausea and vomiting, or diarrhea that is not controlled by your medication, call the clinic.  The clinic phone number is [phone]. Office hours are Monday-Friday 8:30am-5:00pm.  BELOW ARE SYMPTOMS THAT SHOULD BE REPORTED IMMEDIATELY:  *FEVER GREATER THAN 101.0 F  *CHILLS WITH OR WITHOUT FEVER  NAUSEA AND VOMITING THAT IS NOT CONTROLLED WITH YOUR NAUSEA MEDICATION  *UNUSUAL SHORTNESS OF BREATH  *UNUSUAL BRUISING OR BLEEDING  TENDERNESS IN MOUTH AND THROAT WITH OR WITHOUT PRESENCE OF ULCERS  *URINARY PROBLEMS  *BOWEL PROBLEMS  UNUSUAL RASH Items with * indicate a potential emergency and should be followed up as soon as possible. If you have an emergency after office hours please contact your primary care physician or go to the nearest emergency department.  Please call the clinic during office hours if you have any questions or concerns.   You may also contact the Patient Navigator at [phone] should you have any questions or need assistance in obtaining follow up care.      Resources For Cancer Patients and their Caregivers ? [employer]: Can assist with transportation, wigs, general needs, runs Look Good Feel Better.        1-888-227-6333 ? Cancer Care: Provides financial assistance, online support groups, medication/co-pay assistance.  1-800-813-HOPE (4673) ? [facility] Assists [place] cancer  patients and their families through emotional , educational and financial support.  336-427-4357 ? [place] DSS Where to apply for food stamps, Medicaid and utility assistance. 336-342-1394 ? [facility]: Transportation to medical appointments. 336-347-2287 ? Social Security Administration: May apply for disability if have a Stage IV cancer. 336-342-7796 1-800-772-1213 ? [place] [facility]: Assists with nutrition, care and transit needs. 336-349-2343         

## 2017-04-01 NOTE — Progress Notes (Signed)
Celene Squibb, MD [address] [place] [address]  Primary cancer of right upper lobe of lung [facility]) - Plan: Comprehensive metabolic panel, CBC with Differential/Platelet, TSH  CURRENT THERAPY: Keytruda beginning on 05/17/2016 and Xarelto anticoagulation  INTERVAL HISTORY: Darrell Christensen 71 y.o. male returns for followup of Stage IV (T3N2M1B) adenocarcinoma of RUL, mediastinal lymphadenopathy, descending mesocolon (not biopsied- ?synchronous colon cancer?), subcutaneous (L upper buttock, R subscapularis muscle), and L adrenal mets.  Stage IV NOT biopsy proven.  PDL1- high expressor at 50%. AND Low-grade leiomyosarcoma of R chest wall, biopsy proven on 04/02/2016 in the setting of PET scan concerning for metastatic NSCLC. AND PE, RML, small central filling defect.  On Xarelto anticoagulation.  Due for next CT angio chest in November 2018.    Primary cancer of right upper lobe of lung ([place])   05/14/2015 Procedure    Port placement by Dr. Arnoldo Morale      04/01/2016 Initial Diagnosis    Primary cancer of right upper lobe of lung ([place])      04/02/2016 Procedure    Ultrasound-guided core biopsy performed of a solid 1.5 cm soft tissue nodule in the right chest wall.      04/03/2016 Imaging    MRI brain- No acute and intracranial process or metastasis.  Moderate chronic small vessel ischemic disease.      04/04/2016 Pathology Results    Diagnosis Soft Tissue Needle Core Biopsy, Right Chest Wall SMOOTH MUSCLE NEOPLASM Microscopic Comment The differential diagnosis include low grade leiomyosarcoma. The neoplasm shows spindle cell morphology with rare mitotic figures and minimal atypia, no necrosis present. These cells stains positive for smooth muscle actin, desmin, negative for cytokeratin AE1&3, ck8/18, s100, cd117, cd 34 and cd99. This case also reviewed by Dr. [name] and agree.      04/05/2016 Procedure    CT-guided biopsy of right upper lobe lesion, with  tissue specimen sent to pathology, by IR.      04/09/2016 Pathology Results    Lung, needle/core biopsy(ies), Right Upper Lobe - NON SMALL CELL CARCINOMA.      04/17/2016 Pathology Results    PDL1 High Expression.  TPS 50%.      04/24/2016 PET scan    1. Prominently hypermetabolic large right upper lobe mass with metastatic disease to the right paratracheal and right hilar nodal chains, to the descending mesocolon, to the left upper buttock subcutaneous tissues, to the left upper lobe, to the right subscapularis muscle, and possibly to the left adrenal gland and right breast subcutaneous tissues. Appearance compatible with stage IV lung cancer. 2. There is some focal high activity in the ascending colon which is probably from peristalsis, less likely from a local colon mass. This does raise the possibility that the adjacent mesocolon tumor implant could be due to synchronous colon cancer rather than lung metastasis. 3. Other imaging findings of potential clinical significance: Coronary, aortic arch, and branch vessel atherosclerotic vascular disease. Aortoiliac atherosclerotic vascular disease. Deformity left proximal humerus and left glenohumeral joint from prior trauma. Enlarged prostate gland.      04/26/2016 Pathology Results    FoundationONE: Genomic alterations identified- KRAS I96V, CREBBP splice site [id number], LRP1B S515*, BPZ02 H85*, IDP82 splice site [id number], SPTA1 splice site [id number], TP53 C135Y.  Additional findings- MS-Stable, TMB-intermediate (14 Muts/Mb).  No reportable alterations- EGFR, ALK, BRAF, MET, ERBB2, RET, ROS1.      05/17/2016 -  Chemotherapy    The patient had ONDANSETRON IVPB [facility] +/-  DEXAMETHASONE, , Intravenous,  Once, 0 of 1 cycle  pembrolizumab (KEYTRUDA) 200 mg in sodium chloride 0.9 % 50 mL chemo infusion, 200 mg, Intravenous, Once, 0 of 5 cycles  for chemotherapy treatment.        08/06/2016 PET scan    IMPRESSION: 1. Overall  considerable improvement. The right upper lobe mass is markedly reduced in volume and SUV. Thoracic adenopathy have resolved and the hypermetabolic lesion in the right subscapularis muscle has also resolved. 2. The left upper lobe nodule is no longer appreciably hypermetabolic, and is highly indistinct although this may be due to motion artifact. Faint in indistinct nodularity elsewhere in the lungs likewise not currently hypermetabolic. 3. There is some very faint residual low-grade activity along the subcutaneous deposit along the upper left buttock region. Marked reduction in size and activity of the tumor deposit adjacent to the descending colon. 4. Prior right pleural effusion has resolved. 5. Stable appearance of the adrenal glands, with low-level activity and a nodule in the left adrenal gland. 6. Stable small nodule in the right breast, low-grade metabolic activity with maximum SUV 2.2 (formerly 3.0) 7. Other imaging findings of potential clinical significance: Severe arthropathy of the left glenohumeral joint. Coronary, aortic arch, and branch vessel atherosclerotic vascular disease. Aortoiliac atherosclerotic vascular disease. Prominent prostate gland indents the bladder base.        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  Two adjacent large masslike areas of consolidation within the right upper lobe with differential considerations including malignancy or pneumonia. Multiple enlarged right hilar and mediastinal lymph nodes which may be reactive or metastatic in etiology.  Additional indeterminate pulmonary nodules as above. Recommend attention on follow-up.  Indeterminate 2.9 cm left adrenal nodule. This needs dedicated evaluation with pre and post contrast-enhanced CT or MRI after confirmation of pulmonary  process.  Indeterminate nodule within the right breast. Recommend dedicated evaluation with mammography.  Hepatic steatosis.      04/02/2016 Procedure    Ultrasound-guided core biopsy performed of a solid 1.5 cm soft tissue nodule in the right chest wall.      04/04/2016 Pathology Results    Diagnosis Soft Tissue Needle Core Biopsy, Right Chest Wall SMOOTH MUSCLE NEOPLASM Microscopic Comment The differential diagnosis include low grade leiomyosarcoma. The neoplasm shows spindle cell morphology with rare mitotic figures and minimal atypia, no necrosis present. These cells stains positive for smooth muscle actin, desmin, negative for cytokeratin AE1&3, ck8/18, s100, cd117, cd 34 and cd99. This case also reviewed by Dr. [name] and agree.        Patient is here for continued follow up and [place] treatment. He has been tolerating [place] well without any side effects. He states he's had some diarrhea, about 2 bowel movements a day. Denies any SOB, chest pain,abdominal pain. Appetite is good. No weight loss. Patient has been taking his xarelto despite being told on the last visit that his PE has resolved and he does not need to take it anymore.  Review of Systems  Constitutional: Positive for malaise/fatigue. Negative for chills, fever and weight loss.  HENT: Negative.   Eyes: Negative.   Respiratory: Negative.  Negative for cough, shortness of breath and wheezing.   Cardiovascular: Negative for chest pain and leg swelling.  Gastrointestinal: Positive for diarrhea. Negative for blood in stool, constipation, melena, nausea and vomiting.  Genitourinary: Negative for hematuria.  Musculoskeletal: Negative.   Skin: Negative for rash.  Neurological: Negative.  Negative for weakness.  Endo/Heme/Allergies: Negative.   Psychiatric/Behavioral: Negative.     Past Medical History:  Diagnosis Date  . Adrenal mass, left ([place]) 04/01/2016  . Diabetes mellitus ([place]) 05/02/2016  .  Diabetes mellitus without complication ([place])   . Hypertension   . Mass of breast, right 04/01/2016  . Mass of upper lobe of right lung 04/01/2016   2 adjacent, 5 cm masses, hilar adenopathy  . Primary cancer of right upper lobe of lung ([place]) 04/01/2016   2 adjacent, 5 cm masses, hilar adenopathy  . Pulmonary embolus ([place]) 03/31/2016  . 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 PORTACATH PLACEMENT Left 05/13/2016   Procedure: INSERTION PORT-A-CATH;  Surgeon: Aviva Signs, MD;  Location: [facility];  Service: General;  Laterality: Left;    History reviewed. No pertinent family history.  Social History   Socioeconomic History  . Marital status: Divorced    Spouse name: None  . Number of children: None  . Years of education: None  . Highest education level: None  Social Needs  . Financial resource strain: None  . Food insecurity - worry: None  . Food insecurity - inability: None  . Transportation needs - medical: None  . Transportation needs - non-medical: None  Occupational History  . None  Tobacco Use  . Smoking status: Current Every Day Smoker    Years: 61.00    Types: E-cigarettes    Last attempt to quit: 04/11/2014    Years since quitting: 2.9  . Smokeless tobacco: Never Used  . Tobacco comment: patient does vape smoking x 2 years  Substance and Sexual Activity  . Alcohol use: No    Comment: quit 10 years  . Drug use: No  . Sexual activity: None  Other Topics Concern  . None  Social History Narrative  . None     PHYSICAL EXAMINATION  ECOG PERFORMANCE STATUS: 1 - Symptomatic but completely ambulatory  Vitals from today reviewed.  Constitutional: Well-developed, well-nourished, and in no distress.   HENT:  Head: Normocephalic and atraumatic.  Mouth/Throat: No oropharyngeal exudate. Mucosa moist. Eyes: Pupils are equal, round, and reactive to light. Conjunctivae are normal. No scleral icterus.  Neck: Normal range of  motion. Neck supple. No JVD present.  Cardiovascular: Normal rate, regular rhythm and normal heart sounds.  Exam reveals no gallop and no friction rub.   No murmur heard. Pulmonary/Chest: Effort normal and breath sounds normal. No respiratory distress. No wheezes.No rales.  Abdominal: Soft. Bowel sounds are normal. No distension. There is no tenderness. There is no guarding.  Musculoskeletal: No edema or tenderness.  Lymphadenopathy:    No cervical or supraclavicular adenopathy.  Neurological: Alert and oriented to person, place, and time. No cranial nerve deficit.  Skin: Skin is warm and dry. No erythema. No pallor. Mild redness on the back. Psychiatric: Affect and judgment normal.    LABORATORY DATA: CBC    Component Value Date/Time   WBC 12.9 (H) 03/11/2017 0927   RBC 5.03 03/11/2017 0927   HGB 14.4 03/11/2017 0927   HGB 13.5 05/02/2016 0852   HCT 43.4 03/11/2017 0927   HCT 41.0 05/02/2016 0852   PLT 270 03/11/2017 0927   PLT 436 (H) 05/02/2016 0852   MCV 86.3 03/11/2017 0927   MCV 85.7 05/02/2016 0852   MCH 28.6 03/11/2017 0927   MCHC 33.2 03/11/2017 0927  RDW 14.1 03/11/2017 0927   RDW 13.9 05/02/2016 0852   LYMPHSABS 4.9 (H) 03/11/2017 0927   LYMPHSABS 3.4 (H) 05/02/2016 0852   MONOABS 1.2 (H) 03/11/2017 0927   MONOABS 1.2 (H) 05/02/2016 0852   EOSABS 0.7 03/11/2017 0927   EOSABS 1.6 (H) 05/02/2016 0852   BASOSABS 0.1 03/11/2017 0927   BASOSABS 0.2 (H) 05/02/2016 0852      Chemistry      Component Value Date/Time   NA 138 03/11/2017 0927   NA 139 05/02/2016 0852   K 3.8 03/11/2017 0927   K 4.2 05/02/2016 0852   CL 103 03/11/2017 0927   CO2 26 03/11/2017 0927   CO2 24 05/02/2016 0852   BUN 12 03/11/2017 0927   BUN 10.6 05/02/2016 0852   CREATININE 0.98 03/11/2017 0927   CREATININE 1.0 05/02/2016 0852      Component Value Date/Time   CALCIUM 8.6 (L) 03/11/2017 0927   CALCIUM 9.2 05/02/2016 0852   ALKPHOS 86 03/11/2017 0927   ALKPHOS 73 05/02/2016 0852    AST 19 03/11/2017 0927   AST 12 05/02/2016 0852   ALT 20 03/11/2017 0927   ALT 14 05/02/2016 0852   BILITOT 0.6 03/11/2017 0927   BILITOT 0.42 05/02/2016 0852     Lab Results  Component Value Date   TSH 3.614 03/11/2017    PENDING LABS:   RADIOGRAPHIC STUDIES:  Ct Angio Chest Pe W Or Wo Contrast  Result Date: 03/06/2017 CLINICAL DATA:  History of stage IV non-small-cell lung carcinoma of the right upper lobe diagnosed December 2017, presenting for restaging with ongoing chemotherapy. Leiomyosarcoma of the ventral right chest wall diagnosed November 2017. History of pulmonary embolism. EXAM: CT ANGIOGRAPHY CHEST CT ABDOMEN AND PELVIS WITH CONTRAST TECHNIQUE: Multidetector CT imaging of the chest was performed using the standard protocol during bolus administration of intravenous contrast. Multiplanar CT image reconstructions and MIPs were obtained to evaluate the vascular anatomy. Multidetector CT imaging of the abdomen and pelvis was performed using the standard protocol during bolus administration of intravenous contrast. CONTRAST:  100 cc Isovue 370 IV. COMPARISON:  11/01/2016 CT abdomen/ pelvis. 08/06/2016 PET-CT. 03/31/2016 chest CT angiogram. FINDINGS: CTA CHEST FINDINGS Cardiovascular: The study is high quality for the evaluation of pulmonary embolism. There are no filling defects in the central, lobar, segmental or subsegmental pulmonary artery branches to suggest acute pulmonary embolism. Atherosclerotic thoracic aorta with stable ectatic 4.1 cm ascending thoracic aorta. Stable top-normal caliber pulmonary arteries. Stable top-normal heart size. No significant pericardial fluid/thickening. Left anterior descending, left circumflex and right coronary atherosclerosis. Left subclavian MediPort terminates in the upper third of the superior vena cava. Mediastinum/Nodes: Stable hypodense 1.3 cm inferior right thyroid lobe nodule. Unremarkable esophagus. No axillary adenopathy. No  pathologically enlarged mediastinal or hilar lymph nodes. Lungs/Pleura: No pneumothorax. No pleural effusion. Mild centrilobular emphysema with diffuse bronchial wall thickening. No acute consolidative airspace disease. Posterior right upper lobe 5.7 x 2.5 cm lung mass (series 9/ image 51), previously 6.6 x 4.1 cm on 08/06/2016 PET-CT using similar measurement technique, decreased in size. A few scattered solid pulmonary nodules in the right lung are stable to decreased. For example a 7 mm anterior right lower lobe solid pulmonary nodule (series 9/image 70), previously 9 mm, mildly decreased. A 5 mm anterior right upper lobe nodule (series 9/ image 58), previously 5 mm, stable. No new significant pulmonary nodules. Musculoskeletal: No aggressive appearing focal osseous lesions. Subcutaneous 1.6 x 1.3 cm soft tissue mass in the right breast (series 4/image  57), previously 1.6 x 1.3 cm on 08/06/2016 PET-CT, stable. Review of the MIP images confirms the above findings. CT ABDOMEN and PELVIS FINDINGS Hepatobiliary: Normal liver size. No liver mass. Normal gallbladder with no radiopaque cholelithiasis. No biliary ductal dilatation. Pancreas: Normal, with no mass or duct dilation. Spleen: Normal size spleen. No splenic mass. Stable scattered granulomatous splenic calcifications. Adrenals/Urinary Tract: Stable 1.2 cm right adrenal nodule (series 5/ image 27). Stable 2.8 cm left adrenal nodule (series 5/image 28). No hydronephrosis. Subcentimeter hypodense renal cortical lesions in the upper left kidney, too small to characterize, unchanged, probably benign renal cysts. No new renal lesions. Normal bladder. Stomach/Bowel: Grossly normal stomach. Normal caliber small bowel with no small bowel wall thickening. Normal appendix. Normal large bowel with no diverticulosis, large bowel wall thickening or pericolonic fat stranding. Vascular/Lymphatic: Atherosclerotic nonaneurysmal abdominal aorta. Patent portal, splenic, hepatic  and renal veins. No pathologically enlarged lymph nodes in the abdomen or pelvis. Reproductive: Mildly enlarged prostate, stable. Other: No pneumoperitoneum, ascites or focal fluid collection. Stable small fat containing periumbilical hernia. Tiny 0.4 cm soft tissue nodule medial to the descending colon (series 5/image 48), previously 0.8 cm, decreased. Previously noted 1.0 cm deep subcutaneous upper left buttock lesion on 08/06/2016 PET-CT study is absent on today's scan. Musculoskeletal: No aggressive appearing focal osseous lesions. Mild lumbar spondylosis. Review of the MIP images confirms the above findings. IMPRESSION: 1. No evidence of pulmonary embolism. 2. Primary malignancy in the posterior right upper lobe is decreased in size. 3. Scattered pulmonary nodules are stable to decreased in size. 4. No pathologically enlarged lymph nodes in the chest, abdomen or pelvis. 5. Indeterminate bilateral adrenal nodules are stable. 6. Continued reduction in the size of the metastasis medial to the descending colon. 7. Previously visualized deep subcutaneous metastasis in the upper left buttock region is absent on today's scan. 8. No new or progressive metastatic disease. 9. Stable small soft tissue mass in the subcutaneous right breast compatible with biopsy-proven low-grade leiomyosarcoma. 10. Chronic findings include: Aortic Atherosclerosis (ICD10-I70.0) and Emphysema (ICD10-J43.9). Three-vessel coronary atherosclerosis. Mild prostatomegaly. Electronically Signed   By: Ilona Sorrel M.D.   On: 03/06/2017 12:15   Ct Abdomen Pelvis W Contrast  Result Date: 03/06/2017 CLINICAL DATA:  History of stage IV non-small-cell lung carcinoma of the right upper lobe diagnosed December 2017, presenting for restaging with ongoing chemotherapy. Leiomyosarcoma of the ventral right chest wall diagnosed November 2017. History of pulmonary embolism. EXAM: CT ANGIOGRAPHY CHEST CT ABDOMEN AND PELVIS WITH CONTRAST TECHNIQUE:  Multidetector CT imaging of the chest was performed using the standard protocol during bolus administration of intravenous contrast. Multiplanar CT image reconstructions and MIPs were obtained to evaluate the vascular anatomy. Multidetector CT imaging of the abdomen and pelvis was performed using the standard protocol during bolus administration of intravenous contrast. CONTRAST:  100 cc Isovue 370 IV. COMPARISON:  11/01/2016 CT abdomen/ pelvis. 08/06/2016 PET-CT. 03/31/2016 chest CT angiogram. FINDINGS: CTA CHEST FINDINGS Cardiovascular: The study is high quality for the evaluation of pulmonary embolism. There are no filling defects in the central, lobar, segmental or subsegmental pulmonary artery branches to suggest acute pulmonary embolism. Atherosclerotic thoracic aorta with stable ectatic 4.1 cm ascending thoracic aorta. Stable top-normal caliber pulmonary arteries. Stable top-normal heart size. No significant pericardial fluid/thickening. Left anterior descending, left circumflex and right coronary atherosclerosis. Left subclavian MediPort terminates in the upper third of the superior vena cava. Mediastinum/Nodes: Stable hypodense 1.3 cm inferior right thyroid lobe nodule. Unremarkable esophagus. No axillary adenopathy. No pathologically enlarged  mediastinal or hilar lymph nodes. Lungs/Pleura: No pneumothorax. No pleural effusion. Mild centrilobular emphysema with diffuse bronchial wall thickening. No acute consolidative airspace disease. Posterior right upper lobe 5.7 x 2.5 cm lung mass (series 9/ image 51), previously 6.6 x 4.1 cm on 08/06/2016 PET-CT using similar measurement technique, decreased in size. A few scattered solid pulmonary nodules in the right lung are stable to decreased. For example a 7 mm anterior right lower lobe solid pulmonary nodule (series 9/image 70), previously 9 mm, mildly decreased. A 5 mm anterior right upper lobe nodule (series 9/ image 58), previously 5 mm, stable. No new  significant pulmonary nodules. Musculoskeletal: No aggressive appearing focal osseous lesions. Subcutaneous 1.6 x 1.3 cm soft tissue mass in the right breast (series 4/image 57), previously 1.6 x 1.3 cm on 08/06/2016 PET-CT, stable. Review of the MIP images confirms the above findings. CT ABDOMEN and PELVIS FINDINGS Hepatobiliary: Normal liver size. No liver mass. Normal gallbladder with no radiopaque cholelithiasis. No biliary ductal dilatation. Pancreas: Normal, with no mass or duct dilation. Spleen: Normal size spleen. No splenic mass. Stable scattered granulomatous splenic calcifications. Adrenals/Urinary Tract: Stable 1.2 cm right adrenal nodule (series 5/ image 27). Stable 2.8 cm left adrenal nodule (series 5/image 28). No hydronephrosis. Subcentimeter hypodense renal cortical lesions in the upper left kidney, too small to characterize, unchanged, probably benign renal cysts. No new renal lesions. Normal bladder. Stomach/Bowel: Grossly normal stomach. Normal caliber small bowel with no small bowel wall thickening. Normal appendix. Normal large bowel with no diverticulosis, large bowel wall thickening or pericolonic fat stranding. Vascular/Lymphatic: Atherosclerotic nonaneurysmal abdominal aorta. Patent portal, splenic, hepatic and renal veins. No pathologically enlarged lymph nodes in the abdomen or pelvis. Reproductive: Mildly enlarged prostate, stable. Other: No pneumoperitoneum, ascites or focal fluid collection. Stable small fat containing periumbilical hernia. Tiny 0.4 cm soft tissue nodule medial to the descending colon (series 5/image 48), previously 0.8 cm, decreased. Previously noted 1.0 cm deep subcutaneous upper left buttock lesion on 08/06/2016 PET-CT study is absent on today's scan. Musculoskeletal: No aggressive appearing focal osseous lesions. Mild lumbar spondylosis. Review of the MIP images confirms the above findings. IMPRESSION: 1. No evidence of pulmonary embolism. 2. Primary malignancy in  the posterior right upper lobe is decreased in size. 3. Scattered pulmonary nodules are stable to decreased in size. 4. No pathologically enlarged lymph nodes in the chest, abdomen or pelvis. 5. Indeterminate bilateral adrenal nodules are stable. 6. Continued reduction in the size of the metastasis medial to the descending colon. 7. Previously visualized deep subcutaneous metastasis in the upper left buttock region is absent on today's scan. 8. No new or progressive metastatic disease. 9. Stable small soft tissue mass in the subcutaneous right breast compatible with biopsy-proven low-grade leiomyosarcoma. 10. Chronic findings include: Aortic Atherosclerosis (ICD10-I70.0) and Emphysema (ICD10-J43.9). Three-vessel coronary atherosclerosis. Mild prostatomegaly. Electronically Signed   By: Ilona Sorrel M.D.   On: 03/06/2017 12:15     PATHOLOGY:    ASSESSMENT AND PLAN:  Stage IV (T3N2M1B) adenocarcinoma of RUL, mediastinal lymphadenopathy, descending mesocolon (not biopsied- ?synchronous colon cancer?), subcutaneous (L upper buttock, R subscapularis muscle), and L adrenal mets.  Stage IV NOT biopsy proven.  PDL1- high expressor at 50%. AND Low-grade leiomyosarcoma of R chest wall, biopsy proven on 04/02/2016 in the setting of PET scan concerning for metastatic NSCLC.  Stable on most recent staging scans. AND PE, RML, small central filling defect.  On Xarelto anticoagulation.   Continue keytruda q3weeks.  Labs reviewed today ok to proceed  with [place] today. Last restaging CT C/A/P on 03/06/17 demonstrated decrease in size of his primary mass as well as decrease in his other pulmonary nodules. No adenopathy or evidence of new metastatic disease.  Again reviewed with him that his PE has resolved on CT chest so I have told patient he can discontinue the xarelto. I have discussed with him the risk of bleeding while on xarelto, so he needs to stop taking it. Patient verbalized understanding. RTC in 3 weeks  for follow up and [facility].  THERAPY PLAN:  Continue with treatment as planned.  All questions were answered. The patient knows to call the clinic with any problems, questions or concerns. We can certainly see the patient much sooner if necessary.  This note is electronically signed by: Twana First, MD 04/01/2017 9:19 AM

## 2017-04-14 ENCOUNTER — Other Ambulatory Visit (HOSPITAL_COMMUNITY): Payer: Self-pay | Admitting: Oncology

## 2017-04-22 ENCOUNTER — Encounter (HOSPITAL_COMMUNITY): Payer: Self-pay | Admitting: Adult Health

## 2017-04-22 ENCOUNTER — Encounter (HOSPITAL_COMMUNITY): Payer: Medicare Other | Attending: Oncology

## 2017-04-22 ENCOUNTER — Other Ambulatory Visit: Payer: Self-pay

## 2017-04-22 ENCOUNTER — Encounter (HOSPITAL_BASED_OUTPATIENT_CLINIC_OR_DEPARTMENT_OTHER): Payer: Medicare Other | Admitting: Adult Health

## 2017-04-22 VITALS — BP 161/88 | HR 83 | Temp 97.4°F | Resp 20 | Wt 232.6 lb

## 2017-04-22 DIAGNOSIS — C3411 Malignant neoplasm of upper lobe, right bronchus or lung: Secondary | ICD-10-CM | POA: Diagnosis not present

## 2017-04-22 DIAGNOSIS — C7989 Secondary malignant neoplasm of other specified sites: Secondary | ICD-10-CM

## 2017-04-22 DIAGNOSIS — R0602 Shortness of breath: Secondary | ICD-10-CM

## 2017-04-22 DIAGNOSIS — R05 Cough: Secondary | ICD-10-CM

## 2017-04-22 DIAGNOSIS — Z86711 Personal history of pulmonary embolism: Secondary | ICD-10-CM | POA: Diagnosis not present

## 2017-04-22 DIAGNOSIS — C498 Malignant neoplasm of overlapping sites of connective and soft tissue: Secondary | ICD-10-CM

## 2017-04-22 DIAGNOSIS — G893 Neoplasm related pain (acute) (chronic): Secondary | ICD-10-CM

## 2017-04-22 DIAGNOSIS — R197 Diarrhea, unspecified: Secondary | ICD-10-CM | POA: Diagnosis not present

## 2017-04-22 DIAGNOSIS — L299 Pruritus, unspecified: Secondary | ICD-10-CM

## 2017-04-22 DIAGNOSIS — Z5112 Encounter for antineoplastic immunotherapy: Secondary | ICD-10-CM

## 2017-04-22 LAB — COMPREHENSIVE METABOLIC PANEL
ALT: 19 U/L (ref 17–63)
AST: 21 U/L (ref 15–41)
Albumin: 3.9 g/dL (ref 3.5–5.0)
Alkaline Phosphatase: 77 U/L (ref 38–126)
Anion gap: 13 (ref 5–15)
BUN: 9 mg/dL (ref 6–20)
CO2: 21 mmol/L — ABNORMAL LOW (ref 22–32)
Calcium: 8.7 mg/dL — ABNORMAL LOW (ref 8.9–10.3)
Chloride: 102 mmol/L (ref 101–111)
Creatinine, Ser: 1.11 mg/dL (ref 0.61–1.24)
GFR calc Af Amer: >60 mL/min (ref >60)
GFR calc non Af Amer: >60 mL/min (ref >60)
Glucose, Bld: 100 mg/dL — ABNORMAL HIGH (ref 65–99)
Potassium: 3.7 mmol/L (ref 3.5–5.1)
Sodium: 136 mmol/L (ref 135–145)
Total Bilirubin: 0.4 mg/dL (ref 0.3–1.2)
Total Protein: 6.8 g/dL (ref 6.5–8.1)

## 2017-04-22 LAB — CBC WITH DIFFERENTIAL/PLATELET
Basophils Absolute: 0.1 10*3/uL (ref 0.0–0.1)
Basophils Relative: 1 %
Eosinophils Absolute: 0.6 10*3/uL (ref 0.0–0.7)
Eosinophils Relative: 6 %
HCT: 43.4 % (ref 39.0–52.0)
Hemoglobin: 14.2 g/dL (ref 13.0–17.0)
Lymphocytes Relative: 40 %
Lymphs Abs: 4 10*3/uL (ref 0.7–4.0)
MCH: 28.5 pg (ref 26.0–34.0)
MCHC: 32.7 g/dL (ref 30.0–36.0)
MCV: 87.1 fL (ref 78.0–100.0)
Monocytes Absolute: 1.1 10*3/uL — ABNORMAL HIGH (ref 0.1–1.0)
Monocytes Relative: 11 %
Neutro Abs: 4.3 10*3/uL (ref 1.7–7.7)
Neutrophils Relative %: 42 %
Platelets: 257 10*3/uL (ref 150–400)
RBC: 4.98 MIL/uL (ref 4.22–5.81)
RDW: 14.1 % (ref 11.5–15.5)
WBC: 10 10*3/uL (ref 4.0–10.5)

## 2017-04-22 LAB — TSH: TSH: 3.715 u[IU]/mL (ref 0.350–4.500)

## 2017-04-22 MED ORDER — SODIUM CHLORIDE 0.9% FLUSH
10.0000 mL | INTRAVENOUS | Status: DC | PRN
  Administered 2017-04-22: 10 mL
  Filled 2017-04-22: qty 10

## 2017-04-22 MED ORDER — SODIUM CHLORIDE 0.9 % IV SOLN
200.0000 mg | Freq: Once | INTRAVENOUS | Status: AC
  Administered 2017-04-22: 200 mg via INTRAVENOUS
  Filled 2017-04-22: qty 8

## 2017-04-22 MED ORDER — HEPARIN SOD (PORK) LOCK FLUSH 100 UNIT/ML IV SOLN
500.0000 [IU] | Freq: Once | INTRAVENOUS | Status: AC | PRN
  Administered 2017-04-22: 500 [IU]

## 2017-04-22 MED ORDER — OXYCODONE-ACETAMINOPHEN 10-325 MG PO TABS: 1.0000 | ORAL_TABLET | Freq: Four times a day (QID) | ORAL | 0 refills | Status: DC | PRN

## 2017-04-22 MED ORDER — SODIUM CHLORIDE 0.9 % IV SOLN
Freq: Once | INTRAVENOUS | Status: AC
  Administered 2017-04-22: 11:00:00 via INTRAVENOUS

## 2017-04-22 NOTE — Progress Notes (Signed)
Dravyn G Wohlers tolerated Keytruda infusion well without complaints or incident. Labs reviewed with Dr. Talbert Cage and pt seen by Mike Craze NP prior to administering this medication. VSS upon discharge. Pt discharged self ambulatory in satisfactory condition

## 2017-04-22 NOTE — Patient Instructions (Signed)
[facility] Discharge Instructions for Patients Receiving Chemotherapy   Beginning January 23rd 2017 lab work for the [facility] will be done in the  Main lab at [facility] on 1st floor. If you have a lab appointment with the [facility] please come in thru the  Main Entrance and check in at the main information desk   Today you received the following chemotherapy agents Keytruda. Follow-up as scheduled. Call clinic for any questions or concerns  To help prevent nausea and vomiting after your treatment, we encourage you to take your nausea medication   If you develop nausea and vomiting, or diarrhea that is not controlled by your medication, call the clinic.  The clinic phone number is [phone]. Office hours are Monday-Friday 8:30am-5:00pm.  BELOW ARE SYMPTOMS THAT SHOULD BE REPORTED IMMEDIATELY:  *FEVER GREATER THAN 101.0 F  *CHILLS WITH OR WITHOUT FEVER  NAUSEA AND VOMITING THAT IS NOT CONTROLLED WITH YOUR NAUSEA MEDICATION  *UNUSUAL SHORTNESS OF BREATH  *UNUSUAL BRUISING OR BLEEDING  TENDERNESS IN MOUTH AND THROAT WITH OR WITHOUT PRESENCE OF ULCERS  *URINARY PROBLEMS  *BOWEL PROBLEMS  UNUSUAL RASH Items with * indicate a potential emergency and should be followed up as soon as possible. If you have an emergency after office hours please contact your primary care physician or go to the nearest emergency department.  Please call the clinic during office hours if you have any questions or concerns.   You may also contact the Patient Navigator at [phone] should you have any questions or need assistance in obtaining follow up care.      Resources For Cancer Patients and their Caregivers ? American Cancer Society: Can assist with transportation, wigs, general needs, runs Look Good Feel Better.        234 315 4608 ? Cancer Care: Provides financial assistance, online support groups, medication/co-pay assistance.  1-800-813-HOPE  819-051-1979) ? [place] Assists [place] cancer patients and their families through emotional , educational and financial support.  (607)527-4372 ? [place] DSS Where to apply for food stamps, Medicaid and utility assistance. 6190971692 ? RCATS: Transportation to medical appointments. 757-084-2137 ? Social Security Administration: May apply for disability if have a Stage IV cancer. 670-082-0259 (854) 108-6042 ? [employer], Disability and Transit Services: Assists with nutrition, care and transit needs. ([phone]

## 2017-04-22 NOTE — Patient Instructions (Signed)
[place] at [facility] Discharge Instructions  RECOMMENDATIONS MADE BY THE CONSULTANT AND ANY TEST RESULTS WILL BE SENT TO YOUR REFERRING PHYSICIAN.  You were seen today by Mike Craze NP. Continue taking Keytruda every 3 weeks. Return in in 3 weeks for treatment and follow up.   Thank you for choosing [place] at [facility] to provide your oncology and hematology care.  To afford each patient quality time with our provider, please arrive at least 15 minutes before your scheduled appointment time.    If you have a lab appointment with the [facility] please come in thru the  Main Entrance and check in at the main information desk  You need to re-schedule your appointment should you arrive 10 or more minutes late.  We strive to give you quality time with our providers, and arriving late affects you and other patients whose appointments are after yours.  Also, if you no show three or more times for appointments you may be dismissed from the clinic at the providers discretion.     Again, thank you for choosing [facility].  Our hope is that these requests will decrease the amount of time that you wait before being seen by our physicians.       _____________________________________________________________  Should you have questions after your visit to [facility], please contact our office at [phone] between the hours of 8:30 a.m. and 4:30 p.m.  Voicemails left after 4:30 p.m. will not be returned until the following business day.  For prescription refill requests, have your pharmacy contact our office.       Resources For Cancer Patients and their Caregivers ? [employer] Society: Can assist with transportation, wigs, general needs, runs Look Good Feel Better.        937-072-4051 ? Cancer Care: Provides financial assistance, online support groups, medication/co-pay assistance.  1-800-813-HOPE  (939) 069-3947) ? [place] Assists [place] cancer patients and their families through emotional , educational and financial support.  680-689-6158 ? [place] DSS Where to apply for food stamps, Medicaid and utility assistance. 858-360-6277 ? [facility]: Transportation to medical appointments. 585-785-7759 ? Social Security Administration: May apply for disability if have a Stage IV cancer. 209 446 4281 (539)294-9594 ? [employer], Disability and Transit Services: Assists with nutrition, care and transit needs. [place] Support Programs: @10RELATIVEDAYS @ > Cancer Support Group  2nd Tuesday of the month 1pm-2pm, Journey Room  > Creative Journey  3rd Tuesday of the month 1130am-1pm, Journey Room  > Look Good Feel Better  1st Wednesday of the month 10am-12 noon, Journey Room (Call [place] to register [phone])

## 2017-04-22 NOTE — Progress Notes (Signed)
[place],  [address]   CLINIC:  Medical Oncology/Hematology  PCP:  Celene Squibb, MD [place] [address] [phone]   REASON FOR VISIT:  Follow-up for Stage IV NSCLC AND right chest wall leiomyosarcoma AND PE  CURRENT THERAPY: Keytruda every 21 days AND Observation AND Xarelto discontinued in 03/2017 d/t resolved PE on imaging.    BRIEF ONCOLOGIC HISTORY:    Primary cancer of right upper lobe of lung ([facility])   05/14/2015 Procedure    Port placement by Dr. Arnoldo Morale      04/01/2016 Initial Diagnosis    Primary cancer of right upper lobe of lung ([facility])      04/02/2016 Procedure    Ultrasound-guided core biopsy performed of a solid 1.5 cm soft tissue nodule in the right chest wall.      04/03/2016 Imaging    MRI brain- No acute and intracranial process or metastasis.  Moderate chronic small vessel ischemic disease.      04/04/2016 Pathology Results    Diagnosis Soft Tissue Needle Core Biopsy, Right Chest Wall SMOOTH MUSCLE NEOPLASM Microscopic Comment The differential diagnosis include low grade leiomyosarcoma. The neoplasm shows spindle cell morphology with rare mitotic figures and minimal atypia, no necrosis present. These cells stains positive for smooth muscle actin, desmin, negative for cytokeratin AE1&3, ck8/18, s100, cd117, cd 34 and cd99. This case also reviewed by Dr. [name] and agree.      04/05/2016 Procedure    CT-guided biopsy of right upper lobe lesion, with tissue specimen sent to pathology, by IR.      04/09/2016 Pathology Results    Lung, needle/core biopsy(ies), Right Upper Lobe - NON SMALL CELL CARCINOMA.      04/17/2016 Pathology Results    PDL1 High Expression.  TPS 50%.      04/24/2016 PET scan    1. Prominently hypermetabolic large right upper lobe mass with metastatic disease to the right paratracheal and right hilar nodal chains, to the descending mesocolon, to the  left upper buttock subcutaneous tissues, to the left upper lobe, to the right subscapularis muscle, and possibly to the left adrenal gland and right breast subcutaneous tissues. Appearance compatible with stage IV lung cancer. 2. There is some focal high activity in the ascending colon which is probably from peristalsis, less likely from a local colon mass. This does raise the possibility that the adjacent mesocolon tumor implant could be due to synchronous colon cancer rather than lung metastasis. 3. Other imaging findings of potential clinical significance: Coronary, aortic arch, and branch vessel atherosclerotic vascular disease. Aortoiliac atherosclerotic vascular disease. Deformity left proximal humerus and left glenohumeral joint from prior trauma. Enlarged prostate gland.      04/26/2016 Pathology Results    FoundationONE: Genomic alterations identified- KRAS Q22L, CREBBP splice site [id number], LRP1B S515*, HER74 Y81*, KGY18 splice site [id number], SPTA1 splice site [id number], TP53 C135Y.  Additional findings- MS-Stable, TMB-intermediate (14 Muts/Mb).  No reportable alterations- EGFR, ALK, BRAF, MET, ERBB2, RET, ROS1.      05/17/2016 -  Chemotherapy    The patient had ONDANSETRON IVPB [facility] +/- DEXAMETHASONE, , Intravenous,  Once, 0 of 1 cycle  pembrolizumab (KEYTRUDA) 200 mg in sodium chloride 0.9 % 50 mL chemo infusion, 200 mg, Intravenous, Once, 0 of 5 cycles  for chemotherapy treatment.        08/06/2016 PET scan    IMPRESSION: 1. Overall considerable improvement. The right upper lobe mass is markedly reduced  in volume and SUV. Thoracic adenopathy have resolved and the hypermetabolic lesion in the right subscapularis muscle has also resolved. 2. The left upper lobe nodule is no longer appreciably hypermetabolic, and is highly indistinct although this may be due to motion artifact. Faint in indistinct nodularity elsewhere in the lungs likewise not currently  hypermetabolic. 3. There is some very faint residual low-grade activity along the subcutaneous deposit along the upper left buttock region. Marked reduction in size and activity of the tumor deposit adjacent to the descending colon. 4. Prior right pleural effusion has resolved. 5. Stable appearance of the adrenal glands, with low-level activity and a nodule in the left adrenal gland. 6. Stable small nodule in the right breast, low-grade metabolic activity with maximum SUV 2.2 (formerly 3.0) 7. Other imaging findings of potential clinical significance: Severe arthropathy of the left glenohumeral joint. Coronary, aortic arch, and branch vessel atherosclerotic vascular disease. Aortoiliac atherosclerotic vascular disease. Prominent prostate gland indents the bladder base.        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  Two adjacent large masslike areas of consolidation within the right upper lobe with differential considerations including malignancy or pneumonia. Multiple enlarged right hilar and mediastinal lymph nodes which may be reactive or metastatic in etiology.  Additional indeterminate pulmonary nodules as above. Recommend attention on follow-up.  Indeterminate 2.9 cm left adrenal nodule. This needs dedicated evaluation with pre and post contrast-enhanced CT or MRI after confirmation of pulmonary process.  Indeterminate nodule within the right breast. Recommend dedicated evaluation with mammography.  Hepatic steatosis.      04/02/2016 Procedure    Ultrasound-guided core biopsy performed of a solid 1.5 cm soft tissue nodule in the right chest wall.      04/04/2016 Pathology Results    Diagnosis Soft Tissue Needle Core Biopsy, Right Chest Wall SMOOTH MUSCLE NEOPLASM Microscopic Comment The differential diagnosis include low  grade leiomyosarcoma. The neoplasm shows spindle cell morphology with rare mitotic figures and minimal atypia, no necrosis present. These cells stains positive for smooth muscle actin, desmin, negative for cytokeratin AE1&3, ck8/18, s100, cd117, cd 34 and cd99. This case also reviewed by Dr. [name] and agree.         HISTORY OF PRESENT ILLNESS:  (From Kirby Crigler, PA-C's note on 05/24/16)       INTERVAL HISTORY:  Mr. Heitman 71 y.o. male returns for follow-up for metastatic lung cancer, (R) chest wall leiomyosarcoma, and history of PE.   Due today for next cycle Keytruda.   Overall, he tells me that he has been feeling "rough."  When asking him to tell me what has been bothering him, he explains his frustrations with his pain and his pain medications. States that his "sides" on both sides "in my lower lungs."  He takes Percocet 10/325 every 6 hours, "but I ran out because I don't get enough pills to last no time!"    Continues to have diarrhea. States he was taking Lomotil, but ran out of this medication. When I offered to refill it for him, he declined. "The medicine bottle told me to take 5 pills at a time and that didn't work."  (there is no documentation that this medication was prescribed for the patient this way).  States that his diarrhea is "terrible", but is not able to quantify his symptoms.  States, "my friend got some medicine  at the [facility] for diarrhea and it works better than that medicine I have to pay $40 for."  Attempted to clarify if he meant Imodium OTC, but he was not able to tell me if this is the medication his friend took.    He goes on to express his concerns that he recently paid $146 for Xarelto and his pharmacy would not refund him the money. I explained to him that Dr. Talbert Cage had advised him at his past few office visits to stop taking the Xarelto because his PE had resolved on imaging.  He becomes frustrated with me and tells me that I am not listening to him.   He tells me he has stopped taking the Xarelto "but the pharmacy still sent me the damn medication!"  He becomes frustrated that he "hasn't seen a doctor since I been coming here."  Reminded him that he has seen Dr. Talbert Cage for the past several visits.     It is very difficult to get a complete history and ROS for this patient. He has very poor insight on his disease, current symptoms, his treatments, and his medications.    Endorses itching; denies rash. Takes Hydroxyzine at times.  Shortness of breath and cough are stable/no worse. Denies N&V.     Despite his reported symptoms and frustrations, he feels ready for next dose of Keytruda today as scheduled.      REVIEW OF SYSTEMS:  Review of Systems - Oncology, per HPI    PAST MEDICAL/SURGICAL HISTORY:  Past Medical History:  Diagnosis Date  . Adrenal mass, left ([facility]) 04/01/2016  . Diabetes mellitus ([facility]) 05/02/2016  . Diabetes mellitus without complication ([facility])   . Hypertension   . Mass of breast, right 04/01/2016  . Mass of upper lobe of right lung 04/01/2016   2 adjacent, 5 cm masses, hilar adenopathy  . Primary cancer of right upper lobe of lung ([facility]) 04/01/2016   2 adjacent, 5 cm masses, hilar adenopathy  . Pulmonary embolus ([facility]) 03/31/2016  . 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 PORTACATH PLACEMENT Left 05/13/2016   Procedure: INSERTION PORT-A-CATH;  Surgeon: Aviva Signs, MD;  Location: [facility];  Service: General;  Laterality: Left;     SOCIAL HISTORY:  Social History   Socioeconomic History  . Marital status: Divorced    Spouse name: Not on file  . Number of children: Not on file  . Years of education: Not on file  . Highest education level: Not on file  Social Needs  . Financial resource strain: Not on file  . Food insecurity - worry: Not on file  . Food insecurity - inability: Not on file  . Transportation needs - medical: Not on file  . Transportation needs -  non-medical: Not on file  Occupational History  . Not on file  Tobacco Use  . Smoking status: Current Every Day Smoker    Years: 61.00    Types: E-cigarettes    Last attempt to quit: 04/11/2014    Years since quitting: 3.0  . Smokeless tobacco: Never Used  . Tobacco comment: patient does vape smoking x 2 years  Substance and Sexual Activity  . Alcohol use: No    Comment: quit 10 years  . Drug use: No  . Sexual activity: Not on file  Other Topics Concern  . Not on file  Social History Narrative  . Not on file    FAMILY HISTORY:  History reviewed. No pertinent family history.  CURRENT MEDICATIONS:  Outpatient Encounter Medications as of 04/22/2017  Medication Sig Note  . ALPRAZolam (XANAX) 1 MG tablet Take 1 mg by mouth 3 (three) times daily as needed for anxiety.   Marland Kitchen amLODipine (NORVASC) 2.5 MG tablet Take 2.5 mg by mouth daily.   . diphenoxylate-atropine (LOMOTIL) 2.5-0.025 MG tablet Take 1 tablet 4 (four) times daily as needed by mouth for diarrhea or loose stools.   . furosemide (LASIX) 20 MG tablet Take 2 tablets (40 mg total) by mouth daily.   Marland Kitchen GLIPIZIDE XL 5 MG 24 hr tablet Take 5 mg by mouth daily.   . hydrocortisone cream 0.5 % Apply 1 application topically 2 (two) times daily.   . hydrOXYzine (ATARAX/VISTARIL) 25 MG tablet Take 1 tablet (25 mg total) by mouth 3 (three) times daily as needed.   . lidocaine-prilocaine (EMLA) cream Apply a quarter size amount to affected area 1 hour prior to coming to chemotherapy. Cover with plastic wrap.   . metFORMIN (GLUCOPHAGE) 500 MG tablet Take 500 mg by mouth 2 (two) times daily.   . metoprolol succinate (TOPROL-XL) 25 MG 24 hr tablet TAKE ONE TABLET BY MOUTH IN THE EVENING.   . naproxen (NAPROSYN) 500 MG tablet Take 1 tablet (500 mg total) by mouth 2 (two) times daily with a meal.   . ondansetron (ZOFRAN) 8 MG tablet Take 8 mg by mouth every 8 (eight) hours as needed for nausea or vomiting.   Marland Kitchen oxyCODONE-acetaminophen (PERCOCET)  10-325 MG tablet Take 1 tablet by mouth every 6 (six) hours as needed for pain.   . Pembrolizumab (KEYTRUDA IV) Inject into the vein.   . predniSONE (DELTASONE) 20 MG tablet Take at the onset of diarrhea.  Take 5 tablets (100 mg) at one time.  Then call the [facility].   . prochlorperazine (COMPAZINE) 10 MG tablet  05/13/2016: Received from: External Pharmacy  . simvastatin (ZOCOR) 20 MG tablet Take 20 mg by mouth every evening.   . Skin Protectants, Misc. (EUCERIN) cream Apply topically as needed for dry skin.   Marland Kitchen spironolactone (ALDACTONE) 50 MG tablet Take 1 tablet (50 mg total) by mouth daily.   . [DISCONTINUED] oxyCODONE-acetaminophen (PERCOCET) 10-325 MG tablet Take 1 tablet every 6 (six) hours as needed by mouth for pain.    No facility-administered encounter medications on file as of 04/22/2017.     ALLERGIES:  No Known Allergies   PHYSICAL EXAM:  ECOG Performance status: 1-2 - Symptomatic; requires occasional assistance        Physical Exam  Constitutional: He is oriented to person, place, and time.  Chronically-ill appearing male in no acute distress  HENT:  Head: Normocephalic.  Mouth/Throat: Oropharynx is clear and moist.  Eyes: Conjunctivae are normal. No scleral icterus.  Neck: Normal range of motion. Neck supple.  Cardiovascular: Normal rate and regular rhythm.  Pulmonary/Chest: Effort normal. No respiratory distress.  Diminished breath sounds in bilat bases  Abdominal: Soft. Bowel sounds are normal. There is no tenderness. There is no rebound and no guarding.  Musculoskeletal: Normal range of motion. He exhibits edema (Trace BLE/ankle edema).  Lymphadenopathy:    He has no cervical adenopathy.  Neurological: He is alert and oriented to person, place, and time.  Skin: Skin is warm and dry. No rash noted.  Skin is dry   Nursing note and vitals reviewed.    LABORATORY DATA:  I have reviewed the labs as listed.  CBC  Component Value Date/Time   WBC  10.0 04/22/2017 0936   RBC 4.98 04/22/2017 0936   HGB 14.2 04/22/2017 0936   HGB 13.5 05/02/2016 0852   HCT 43.4 04/22/2017 0936   HCT 41.0 05/02/2016 0852   PLT 257 04/22/2017 0936   PLT 436 (H) 05/02/2016 0852   MCV 87.1 04/22/2017 0936   MCV 85.7 05/02/2016 0852   MCH 28.5 04/22/2017 0936   MCHC 32.7 04/22/2017 0936   RDW 14.1 04/22/2017 0936   RDW 13.9 05/02/2016 0852   LYMPHSABS 4.0 04/22/2017 0936   LYMPHSABS 3.4 (H) 05/02/2016 0852   MONOABS 1.1 (H) 04/22/2017 0936   MONOABS 1.2 (H) 05/02/2016 0852   EOSABS 0.6 04/22/2017 0936   EOSABS 1.6 (H) 05/02/2016 0852   BASOSABS 0.1 04/22/2017 0936   BASOSABS 0.2 (H) 05/02/2016 0852   CMP Latest Ref Rng & Units 04/22/2017 04/01/2017 03/11/2017  Glucose 65 - 99 mg/dL 100(H) 112(H) 114(H)  BUN 6 - 20 mg/dL '9 16 12  ' Creatinine 0.61 - 1.24 mg/dL 1.11 1.11 0.98  Sodium 135 - 145 mmol/L 136 136 138  Potassium 3.5 - 5.1 mmol/L 3.7 3.7 3.8  Chloride 101 - 111 mmol/L 102 104 103  CO2 22 - 32 mmol/L 21(L) 25 26  Calcium 8.9 - 10.3 mg/dL 8.7(L) 8.9 8.6(L)  Total Protein 6.5 - 8.1 g/dL 6.8 6.6 6.7  Total Bilirubin 0.3 - 1.2 mg/dL 0.4 0.6 0.6  Alkaline Phos 38 - 126 U/L 77 75 86  AST 15 - 41 U/L '21 17 19  ' ALT 17 - 63 U/L '19 20 20    ' PENDING LABS:    DIAGNOSTIC IMAGING:  *Images reviewed independently and agree with below radiologic reports as listed below.  CTA chest & CT abd/pelvis: 03/06/17 CLINICAL DATA:  History of stage IV non-small-cell lung carcinoma of the right upper lobe diagnosed December 2017, presenting for restaging with ongoing chemotherapy. Leiomyosarcoma of the ventral right chest wall diagnosed November 2017. History of pulmonary embolism.  EXAM: CT ANGIOGRAPHY CHEST  CT ABDOMEN AND PELVIS WITH CONTRAST  TECHNIQUE: Multidetector CT imaging of the chest was performed using the standard protocol during bolus administration of intravenous contrast. Multiplanar CT image reconstructions and MIPs  were obtained to evaluate the vascular anatomy. Multidetector CT imaging of the abdomen and pelvis was performed using the standard protocol during bolus administration of intravenous contrast.  CONTRAST:  100 cc Isovue 370 IV.  COMPARISON:  11/01/2016 CT abdomen/ pelvis. 08/06/2016 PET-CT. 03/31/2016 chest CT angiogram.  FINDINGS: CTA CHEST FINDINGS  Cardiovascular: The study is high quality for the evaluation of pulmonary embolism. There are no filling defects in the central, lobar, segmental or subsegmental pulmonary artery branches to suggest acute pulmonary embolism. Atherosclerotic thoracic aorta with stable ectatic 4.1 cm ascending thoracic aorta. Stable top-normal caliber pulmonary arteries. Stable top-normal heart size. No significant pericardial fluid/thickening. Left anterior descending, left circumflex and right coronary atherosclerosis. Left subclavian MediPort terminates in the upper third of the superior vena cava.  Mediastinum/Nodes: Stable hypodense 1.3 cm inferior right thyroid lobe nodule. Unremarkable esophagus. No axillary adenopathy. No pathologically enlarged mediastinal or hilar lymph nodes.  Lungs/Pleura: No pneumothorax. No pleural effusion. Mild centrilobular emphysema with diffuse bronchial wall thickening. No acute consolidative airspace disease. Posterior right upper lobe 5.7 x 2.5 cm lung mass (series 9/ image 51), previously 6.6 x 4.1 cm on 08/06/2016 PET-CT using similar measurement technique, decreased in size. A few scattered solid pulmonary nodules in the right lung are stable to decreased. For  example a 7 mm anterior right lower lobe solid pulmonary nodule (series 9/image 70), previously 9 mm, mildly decreased. A 5 mm anterior right upper lobe nodule (series 9/ image 58), previously 5 mm, stable. No new significant pulmonary nodules.  Musculoskeletal: No aggressive appearing focal osseous lesions. Subcutaneous 1.6 x 1.3 cm soft  tissue mass in the right breast (series 4/image 57), previously 1.6 x 1.3 cm on 08/06/2016 PET-CT, stable.  Review of the MIP images confirms the above findings.  CT ABDOMEN and PELVIS FINDINGS  Hepatobiliary: Normal liver size. No liver mass. Normal gallbladder with no radiopaque cholelithiasis. No biliary ductal dilatation.  Pancreas: Normal, with no mass or duct dilation.  Spleen: Normal size spleen. No splenic mass. Stable scattered granulomatous splenic calcifications.  Adrenals/Urinary Tract: Stable 1.2 cm right adrenal nodule (series 5/ image 27). Stable 2.8 cm left adrenal nodule (series 5/image 28). No hydronephrosis. Subcentimeter hypodense renal cortical lesions in the upper left kidney, too small to characterize, unchanged, probably benign renal cysts. No new renal lesions. Normal bladder.  Stomach/Bowel: Grossly normal stomach. Normal caliber small bowel with no small bowel wall thickening. Normal appendix. Normal large bowel with no diverticulosis, large bowel wall thickening or pericolonic fat stranding.  Vascular/Lymphatic: Atherosclerotic nonaneurysmal abdominal aorta. Patent portal, splenic, hepatic and renal veins. No pathologically enlarged lymph nodes in the abdomen or pelvis.  Reproductive: Mildly enlarged prostate, stable.  Other: No pneumoperitoneum, ascites or focal fluid collection. Stable small fat containing periumbilical hernia. Tiny 0.4 cm soft tissue nodule medial to the descending colon (series 5/image 48), previously 0.8 cm, decreased. Previously noted 1.0 cm deep subcutaneous upper left buttock lesion on 08/06/2016 PET-CT study is absent on today's scan.  Musculoskeletal: No aggressive appearing focal osseous lesions. Mild lumbar spondylosis.  Review of the MIP images confirms the above findings.  IMPRESSION: 1. No evidence of pulmonary embolism. 2. Primary malignancy in the posterior right upper lobe is decreased in  size. 3. Scattered pulmonary nodules are stable to decreased in size. 4. No pathologically enlarged lymph nodes in the chest, abdomen or pelvis. 5. Indeterminate bilateral adrenal nodules are stable. 6. Continued reduction in the size of the metastasis medial to the descending colon. 7. Previously visualized deep subcutaneous metastasis in the upper left buttock region is absent on today's scan. 8. No new or progressive metastatic disease. 9. Stable small soft tissue mass in the subcutaneous right breast compatible with biopsy-proven low-grade leiomyosarcoma. 10. Chronic findings include: Aortic Atherosclerosis (ICD10-I70.0) and Emphysema (ICD10-J43.9). Three-vessel coronary atherosclerosis. Mild prostatomegaly.   Electronically Signed   By: Ilona Sorrel M.D.   On: 03/06/2017 12:15       PATHOLOGY:  Right chest wall biopsy: 04/02/16   RUL Lung biopsy: 04/05/16           ASSESSMENT & PLAN:   Stage IV NSCLC with multiple sites of metastases:  -He understands that his disease is treatable, but not curable.  -Due for next cycle Keytruda today. He has been tolerating Keytruda relatively well; he does have diarrhea, but it is difficult to quantify his diarrhea based on his reported symptoms. Discussed with Dr. Talbert Cage. Labs reviewed and adequate for treatment.  -Most recent restaging CT chest/abd/pelvis on 03/06/17 showed good response with RUL lung mass decreased in size, scattered pulmonary nodules decreased in size, reduction in size of mets medial to descending colon. No new or progressive metastatic disease.  Next restaging imaging will be due in late 06/2017; will place orders at subsequent follow-up visit.  -Continue Keytruda  every 3 weeks.  -Return to cancer center in 3 weeks for follow-up visit with MD and treatment.     Right chest wall leiomyosarcoma:  -CTA chest 03/06/17 with stable soft tissue mass in subQ right breast c/w biopsy-proven low-grade leiomyosarcoma.   -Historically, he has had a very hard time understanding his diagnosis. It is difficult for him to understand the different between metastatic lung cancer and leiomyosarcoma, and he becomes visibly frustrated and confused at times.  -May need to consider referral to surgery for resection of sarcoma. No referrals have been placed at this time.   History of PE:  -CTA on 03/06/17 revealed no evidence of PE. He was advised to stop Xarelto, which he states he has done.  No symptoms or physical exam findings concerning for recurrent PE.   Pain:  -[place] Controlled Substance Reporting System reviewed and refill is appropriate. Received last refill of Percocet on 03/10/17.   -Provided new Rx for him today with #80 pills (instead of 60). This should last him at least 3 weeks (until his next [facility] treatment). Reinforced the importance of only taking medication as needed and not around-the-clock every 6 hours. Reminded him of the importance of adequate bowel regimen while taking opiates.            Dispo:  -Return to cancer center in 3 weeks for follow-up visit and next [facility] treatment.    All questions were answered to patient's stated satisfaction. Encouraged patient to call with any new concerns or questions before his next visit to the cancer center and we can certain see him sooner, if needed.      Orders placed this encounter:  No orders of the defined types were placed in this encounter.     Mike Craze, NP [place] [phone]

## 2017-04-23 ENCOUNTER — Encounter (HOSPITAL_COMMUNITY): Payer: Self-pay | Admitting: Adult Health

## 2017-05-13 ENCOUNTER — Inpatient Hospital Stay (HOSPITAL_COMMUNITY): Payer: Medicare Other | Admitting: Hematology and Oncology

## 2017-05-13 ENCOUNTER — Ambulatory Visit (HOSPITAL_COMMUNITY): Payer: Medicare Other | Admitting: Hematology and Oncology

## 2017-05-13 ENCOUNTER — Other Ambulatory Visit: Payer: Self-pay

## 2017-05-13 ENCOUNTER — Inpatient Hospital Stay (HOSPITAL_COMMUNITY): Payer: Medicare Other | Attending: Adult Health

## 2017-05-13 ENCOUNTER — Encounter (HOSPITAL_COMMUNITY): Payer: Self-pay | Admitting: Hematology and Oncology

## 2017-05-13 VITALS — BP 157/109 | HR 85 | Temp 97.7°F | Resp 20 | Ht 71.0 in | Wt 232.0 lb

## 2017-05-13 VITALS — BP 154/91 | HR 67 | Temp 98.6°F | Resp 20

## 2017-05-13 DIAGNOSIS — C3411 Malignant neoplasm of upper lobe, right bronchus or lung: Secondary | ICD-10-CM | POA: Diagnosis not present

## 2017-05-13 DIAGNOSIS — R0602 Shortness of breath: Secondary | ICD-10-CM | POA: Diagnosis not present

## 2017-05-13 DIAGNOSIS — R197 Diarrhea, unspecified: Secondary | ICD-10-CM

## 2017-05-13 DIAGNOSIS — F17210 Nicotine dependence, cigarettes, uncomplicated: Secondary | ICD-10-CM | POA: Insufficient documentation

## 2017-05-13 DIAGNOSIS — Z5111 Encounter for antineoplastic chemotherapy: Secondary | ICD-10-CM | POA: Insufficient documentation

## 2017-05-13 DIAGNOSIS — Z79899 Other long term (current) drug therapy: Secondary | ICD-10-CM | POA: Diagnosis not present

## 2017-05-13 DIAGNOSIS — Z86711 Personal history of pulmonary embolism: Secondary | ICD-10-CM | POA: Diagnosis not present

## 2017-05-13 DIAGNOSIS — Z7984 Long term (current) use of oral hypoglycemic drugs: Secondary | ICD-10-CM | POA: Diagnosis not present

## 2017-05-13 DIAGNOSIS — G893 Neoplasm related pain (acute) (chronic): Secondary | ICD-10-CM | POA: Diagnosis not present

## 2017-05-13 DIAGNOSIS — I10 Essential (primary) hypertension: Secondary | ICD-10-CM | POA: Diagnosis not present

## 2017-05-13 DIAGNOSIS — K760 Fatty (change of) liver, not elsewhere classified: Secondary | ICD-10-CM | POA: Diagnosis not present

## 2017-05-13 DIAGNOSIS — E119 Type 2 diabetes mellitus without complications: Secondary | ICD-10-CM | POA: Insufficient documentation

## 2017-05-13 DIAGNOSIS — C499 Malignant neoplasm of connective and soft tissue, unspecified: Secondary | ICD-10-CM

## 2017-05-13 LAB — CBC WITH DIFFERENTIAL/PLATELET
Basophils Absolute: 0.1 10*3/uL (ref 0.0–0.1)
Basophils Relative: 1 %
Eosinophils Absolute: 0.8 10*3/uL — ABNORMAL HIGH (ref 0.0–0.7)
Eosinophils Relative: 6 %
HCT: 44.6 % (ref 39.0–52.0)
Hemoglobin: 14.6 g/dL (ref 13.0–17.0)
Lymphocytes Relative: 41 %
Lymphs Abs: 4.9 10*3/uL — ABNORMAL HIGH (ref 0.7–4.0)
MCH: 28.5 pg (ref 26.0–34.0)
MCHC: 32.7 g/dL (ref 30.0–36.0)
MCV: 86.9 fL (ref 78.0–100.0)
Monocytes Absolute: 1 10*3/uL (ref 0.1–1.0)
Monocytes Relative: 8 %
Neutro Abs: 5.1 10*3/uL (ref 1.7–7.7)
Neutrophils Relative %: 44 %
Platelets: 273 10*3/uL (ref 150–400)
RBC: 5.13 MIL/uL (ref 4.22–5.81)
RDW: 13.8 % (ref 11.5–15.5)
WBC: 12 10*3/uL — ABNORMAL HIGH (ref 4.0–10.5)

## 2017-05-13 LAB — COMPREHENSIVE METABOLIC PANEL
ALT: 15 U/L — ABNORMAL LOW (ref 17–63)
AST: 17 U/L (ref 15–41)
Albumin: 4 g/dL (ref 3.5–5.0)
Alkaline Phosphatase: 76 U/L (ref 38–126)
Anion gap: 10 (ref 5–15)
BUN: 17 mg/dL (ref 6–20)
CO2: 23 mmol/L (ref 22–32)
Calcium: 8.9 mg/dL (ref 8.9–10.3)
Chloride: 104 mmol/L (ref 101–111)
Creatinine, Ser: 1.14 mg/dL (ref 0.61–1.24)
GFR calc Af Amer: >60 mL/min (ref >60)
GFR calc non Af Amer: >60 mL/min (ref >60)
Glucose, Bld: 101 mg/dL — ABNORMAL HIGH (ref 65–99)
Potassium: 3.8 mmol/L (ref 3.5–5.1)
Sodium: 137 mmol/L (ref 135–145)
Total Bilirubin: 0.5 mg/dL (ref 0.3–1.2)
Total Protein: 6.8 g/dL (ref 6.5–8.1)

## 2017-05-13 LAB — TSH: TSH: 2.821 u[IU]/mL (ref 0.350–4.500)

## 2017-05-13 MED ORDER — LIDOCAINE-PRILOCAINE 2.5-2.5 % EX CREA: TOPICAL_CREAM | CUTANEOUS | 2 refills | Status: DC

## 2017-05-13 MED ORDER — HEPARIN SOD (PORK) LOCK FLUSH 100 UNIT/ML IV SOLN
500.0000 [IU] | Freq: Once | INTRAVENOUS | Status: AC | PRN
  Administered 2017-05-13: 500 [IU]
  Filled 2017-05-13: qty 5

## 2017-05-13 MED ORDER — SODIUM CHLORIDE 0.9 % IV SOLN
Freq: Once | INTRAVENOUS | Status: AC
  Administered 2017-05-13: 10:00:00 via INTRAVENOUS

## 2017-05-13 MED ORDER — SODIUM CHLORIDE 0.9% FLUSH
10.0000 mL | INTRAVENOUS | Status: DC | PRN
  Administered 2017-05-13: 10 mL
  Filled 2017-05-13: qty 10

## 2017-05-13 MED ORDER — OXYCODONE-ACETAMINOPHEN 10-325 MG PO TABS: 1.0000 | ORAL_TABLET | ORAL | 0 refills | Status: AC | PRN

## 2017-05-13 MED ORDER — OXYCODONE-ACETAMINOPHEN 10-325 MG PO TABS: 1.0000 | ORAL_TABLET | ORAL | 0 refills | Status: DC | PRN

## 2017-05-13 MED ORDER — SODIUM CHLORIDE 0.9 % IV SOLN
200.0000 mg | Freq: Once | INTRAVENOUS | Status: AC
  Administered 2017-05-13: 200 mg via INTRAVENOUS
  Filled 2017-05-13: qty 8

## 2017-05-13 NOTE — Progress Notes (Signed)
MD seen patient, labs reviewed by MD. Proceed with treatment.  Treatment given per orders. Patient tolerated it well without problems. Vitals stable and discharged home from clinic ambulatory. Follow up as scheduled.

## 2017-05-13 NOTE — Patient Instructions (Signed)
[facility] Discharge Instructions for Patients Receiving Chemotherapy   Beginning January 23rd 2017 lab work for the Cancer Center will be done in the  Main lab at [facility] on 1st floor. If you have a lab appointment with the Cancer Center please come in thru the  Main Entrance and check in at the main information desk   Today you received the following chemotherapy agents   To help prevent nausea and vomiting after your treatment, we encourage you to take your nausea medication     If you develop nausea and vomiting, or diarrhea that is not controlled by your medication, call the clinic.  The clinic phone number is [phone]. Office hours are Monday-Friday 8:30am-5:00pm.  BELOW ARE SYMPTOMS THAT SHOULD BE REPORTED IMMEDIATELY:  *FEVER GREATER THAN 101.0 F  *CHILLS WITH OR WITHOUT FEVER  NAUSEA AND VOMITING THAT IS NOT CONTROLLED WITH YOUR NAUSEA MEDICATION  *UNUSUAL SHORTNESS OF BREATH  *UNUSUAL BRUISING OR BLEEDING  TENDERNESS IN MOUTH AND THROAT WITH OR WITHOUT PRESENCE OF ULCERS  *URINARY PROBLEMS  *BOWEL PROBLEMS  UNUSUAL RASH Items with * indicate a potential emergency and should be followed up as soon as possible. If you have an emergency after office hours please contact your primary care physician or go to the nearest emergency department.  Please call the clinic during office hours if you have any questions or concerns.   You may also contact the Patient Navigator at [phone] should you have any questions or need assistance in obtaining follow up care.      Resources For Cancer Patients and their Caregivers ? American Cancer Society: Can assist with transportation, wigs, general needs, runs Look Good Feel Better.        1-888-227-6333 ? Cancer Care: Provides financial assistance, online support groups, medication/co-pay assistance.  1-800-813-HOPE (4673) ? [facility] Assists [place] cancer  patients and their families through emotional , educational and financial support.  336-427-4357 ? [place] DSS Where to apply for food stamps, Medicaid and utility assistance. 336-342-1394 ? [facility]: Transportation to medical appointments. 336-347-2287 ? Social Security Administration: May apply for disability if have a Stage IV cancer. 336-342-7796 1-800-772-1213 ? [place] Aging, Disability and Transit Services: Assists with nutrition, care and transit needs. 336-349-2343         

## 2017-05-23 NOTE — Progress Notes (Signed)
[facility] Follow-up Visit:  Assessment: Primary cancer of right upper lobe of lung ([place]) 72 y.o. male with diagnosis of leiomyosarcoma of the right chest wall as well as a stage IV adenocarcinoma of the lung, currently undergoing palliative systemic immunotherapy with pembrolizumab.  Patient has stable but significant complaints including pain, also moderate diarrhea both of which are well controlled on the current treatment.  Overall, clinical evaluation and lab work are permissive to proceed with the next cycle of immunotherapy.  Plan: -- Increase supply of oxycodone/acetaminophen to 50 pills every 2 weeks.  If further intensification of pain management is desired, we could start patient on OxyContin -- Proceed with the next cycle of immunotherapy -- Restaging CT of the chest/abdomen/pelvis in February --Return to clinic in 3 weeks with labs and cycle #20 of pembrolizumab (Keytruda). . Voice recognition software was used and creation of this note. Despite my best effort at editing the text, some misspelling/errors may have occurred.  Orders Placed This Encounter  Procedures  . CBC with Differential (Cancer Center Only)    Standing Status:   Future    Number of Occurrences:   1    Standing Expiration Date:   05/13/2018  . TSH    Standing Status:   Future    Standing Expiration Date:   05/13/2018    Cancer Staging Primary cancer of right upper lobe of lung ([place]) Staging form: Lung, AJCC 8th Edition - Clinical: Stage IVB (cT3, cN2, cM1c) - Signed by Baird Cancer, PA-C on 05/07/2016   All questions were answered.  . The patient knows to call the clinic with any problems, questions or concerns.  This note was electronically signed.    History of Presenting Illness Darrell Christensen 71 y.o. presenting to the [facility] for several diagnoses including a pulmonary embolism, leiomyosarcoma of the right chest wall, and stage IV non-small cell lung carcinoma,  currently receiving therapy with pembrolizumab.  Patient returns to the clinic for consideration of possible 19th cycle of the immunotherapy.  In the interim, patient denies any respiratory symptoms, denies any interval growth of the right chest wall mass.  He does have more bad days than good days with depressed appetite eating only one meal a day.  He does complain of pain in the right lower chest and back with reasonable response from current pain medications, but he has been requiring most of his as needed doses so far.  Also complaining of diarrhea with 10-12 bowel movements per day which improves with Imodium down to 2 bowel movements per day.  No active nausea.  No hematochezia or melena..  Oncological/hematological History:   Primary cancer of right upper lobe of lung ([place])   05/14/2015 Procedure    Port placement by Dr. Arnoldo Morale      04/01/2016 Initial Diagnosis    Primary cancer of right upper lobe of lung ([place])      04/02/2016 Procedure    Ultrasound-guided core biopsy performed of a solid 1.5 cm soft tissue nodule in the right chest wall.      04/03/2016 Imaging    MRI brain- No acute and intracranial process or metastasis.  Moderate chronic small vessel ischemic disease.      04/04/2016 Pathology Results    Diagnosis Soft Tissue Needle Core Biopsy, Right Chest Wall SMOOTH MUSCLE NEOPLASM Microscopic Comment The differential diagnosis include low grade leiomyosarcoma. The neoplasm shows spindle cell morphology with rare mitotic figures and minimal atypia, no necrosis present. These  cells stains positive for smooth muscle actin, desmin, negative for cytokeratin AE1&3, ck8/18, s100, cd117, cd 34 and cd99. This case also reviewed by Dr. [name] and agree.      04/05/2016 Procedure    CT-guided biopsy of right upper lobe lesion, with tissue specimen sent to pathology, by IR.      04/09/2016 Pathology Results    Lung, needle/core biopsy(ies), Right Upper Lobe - NON SMALL  CELL CARCINOMA.      04/17/2016 Pathology Results    PDL1 High Expression.  TPS 50%.      04/24/2016 PET scan    1. Prominently hypermetabolic large right upper lobe mass with metastatic disease to the right paratracheal and right hilar nodal chains, to the descending mesocolon, to the left upper buttock subcutaneous tissues, to the left upper lobe, to the right subscapularis muscle, and possibly to the left adrenal gland and right breast subcutaneous tissues. Appearance compatible with stage IV lung cancer. 2. There is some focal high activity in the ascending colon which is probably from peristalsis, less likely from a local colon mass. This does raise the possibility that the adjacent mesocolon tumor implant could be due to synchronous colon cancer rather than lung metastasis. 3. Other imaging findings of potential clinical significance: Coronary, aortic arch, and branch vessel atherosclerotic vascular disease. Aortoiliac atherosclerotic vascular disease. Deformity left proximal humerus and left glenohumeral joint from prior trauma. Enlarged prostate gland.      04/26/2016 Pathology Results    [facility]: Genomic alterations identified- KRAS V37T, CREBBP splice site [id number], LRP1B S515*, NIO27 O35*, KKX38 splice site [id number], SPTA1 splice site [id number], TP53 C135Y.  Additional findings- MS-Stable, TMB-intermediate (14 Muts/Mb).  No reportable alterations- EGFR, ALK, BRAF, MET, ERBB2, RET, ROS1.      05/17/2016 -  Chemotherapy    The patient had ONDANSETRON IVPB [facility] +/- DEXAMETHASONE, , Intravenous,  Once, 0 of 1 cycle  pembrolizumab (KEYTRUDA) 200 mg in sodium chloride 0.9 % 50 mL chemo infusion, 200 mg, Intravenous, Once, 0 of 5 cycles  for chemotherapy treatment.        08/06/2016 PET scan    IMPRESSION: 1. Overall considerable improvement. The right upper lobe mass is markedly reduced in volume and SUV. Thoracic adenopathy have resolved and the  hypermetabolic lesion in the right subscapularis muscle has also resolved. 2. The left upper lobe nodule is no longer appreciably hypermetabolic, and is highly indistinct although this may be due to motion artifact. Faint in indistinct nodularity elsewhere in the lungs likewise not currently hypermetabolic. 3. There is some very faint residual low-grade activity along the subcutaneous deposit along the upper left buttock region. Marked reduction in size and activity of the tumor deposit adjacent to the descending colon. 4. Prior right pleural effusion has resolved. 5. Stable appearance of the adrenal glands, with low-level activity and a nodule in the left adrenal gland. 6. Stable small nodule in the right breast, low-grade metabolic activity with maximum SUV 2.2 (formerly 3.0) 7. Other imaging findings of potential clinical significance: Severe arthropathy of the left glenohumeral joint. Coronary, aortic arch, and branch vessel atherosclerotic vascular disease. Aortoiliac atherosclerotic vascular disease. Prominent prostate gland indents the bladder base.        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  Two adjacent large masslike areas of consolidation within the right upper lobe with differential considerations including malignancy or pneumonia.  Multiple enlarged right hilar and mediastinal lymph nodes which may be reactive or metastatic in etiology.  Additional indeterminate pulmonary nodules as above. Recommend attention on follow-up.  Indeterminate 2.9 cm left adrenal nodule. This needs dedicated evaluation with pre and post contrast-enhanced CT or MRI after confirmation of pulmonary process.  Indeterminate nodule within the right breast. Recommend dedicated evaluation with mammography.  Hepatic steatosis.      04/02/2016  Procedure    Ultrasound-guided core biopsy performed of a solid 1.5 cm soft tissue nodule in the right chest wall.      04/04/2016 Pathology Results    Diagnosis Soft Tissue Needle Core Biopsy, Right Chest Wall SMOOTH MUSCLE NEOPLASM Microscopic Comment The differential diagnosis include low grade leiomyosarcoma. The neoplasm shows spindle cell morphology with rare mitotic figures and minimal atypia, no necrosis present. These cells stains positive for smooth muscle actin, desmin, negative for cytokeratin AE1&3, ck8/18, s100, cd117, cd 34 and cd99. This case also reviewed by Dr. [name] and agree.        Medical History: Past Medical History:  Diagnosis Date  . Adrenal mass, left ([place]) 04/01/2016  . Diabetes mellitus ([place]) 05/02/2016  . Diabetes mellitus without complication ([place])   . Hypertension   . Mass of breast, right 04/01/2016  . Mass of upper lobe of right lung 04/01/2016   2 adjacent, 5 cm masses, hilar adenopathy  . Primary cancer of right upper lobe of lung ([place]) 04/01/2016   2 adjacent, 5 cm masses, hilar adenopathy  . Pulmonary embolus ([place]) 03/31/2016  . 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 PORTACATH PLACEMENT Left 05/13/2016   Procedure: INSERTION PORT-A-CATH;  Surgeon: Aviva Signs, MD;  Location: [facility];  Service: General;  Laterality: Left;    Family History: History reviewed. No pertinent family history.  Social History: Social History   Socioeconomic History  . Marital status: Divorced    Spouse name: Not on file  . Number of children: Not on file  . Years of education: Not on file  . Highest education level: Not on file  Social Needs  . Financial resource strain: Not on file  . Food insecurity - worry: Not on file  . Food insecurity - inability: Not on file  . Transportation needs - medical: Not on file  . Transportation needs - non-medical: Not on file  Occupational History   . Not on file  Tobacco Use  . Smoking status: Current Every Day Smoker    Years: 61.00    Types: E-cigarettes    Last attempt to quit: 04/11/2014    Years since quitting: 3.1  . Smokeless tobacco: Never Used  . Tobacco comment: patient does vape smoking x 2 years  Substance and Sexual Activity  . Alcohol use: No    Comment: quit 10 years  . Drug use: No  . Sexual activity: Not on file  Other Topics Concern  . Not on file  Social History Narrative  . Not on file    Allergies: No Known Allergies  Medications:  Current Outpatient Medications  Medication Sig Dispense Refill  . ALPRAZolam (XANAX) 1 MG tablet Take 1 mg by mouth 3 (three) times daily as needed for anxiety.    Marland Kitchen amLODipine (NORVASC) 2.5 MG tablet Take 2.5 mg by mouth daily.  4  . diphenoxylate-atropine (LOMOTIL) 2.5-0.025 MG tablet Take 1 tablet 4 (four) times daily as needed by mouth for diarrhea or loose stools.  60 tablet 0  . furosemide (LASIX) 20 MG tablet Take 2 tablets (40 mg total) by mouth daily. 20 tablet 0  . GLIPIZIDE XL 5 MG 24 hr tablet Take 5 mg by mouth daily.  4  . hydrocortisone cream 0.5 % Apply 1 application topically 2 (two) times daily. 30 g 0  . hydrOXYzine (ATARAX/VISTARIL) 25 MG tablet Take 1 tablet (25 mg total) by mouth 3 (three) times daily as needed. 45 tablet 5  . lidocaine-prilocaine (EMLA) cream Apply a quarter size amount to affected area 1 hour prior to coming to chemotherapy. Cover with plastic wrap. 30 g 2  . metFORMIN (GLUCOPHAGE) 500 MG tablet Take 500 mg by mouth 2 (two) times daily.  4  . metoprolol succinate (TOPROL-XL) 25 MG 24 hr tablet TAKE ONE TABLET BY MOUTH IN THE EVENING. 30 tablet 0  . naproxen (NAPROSYN) 500 MG tablet Take 1 tablet (500 mg total) by mouth 2 (two) times daily with a meal. 30 tablet 1  . ondansetron (ZOFRAN) 8 MG tablet Take 8 mg by mouth every 8 (eight) hours as needed for nausea or vomiting.    Marland Kitchen oxyCODONE-acetaminophen (PERCOCET) 10-325 MG tablet Take 1  tablet by mouth every 4 (four) hours as needed for up to 14 days for pain. 50 tablet 0  . Pembrolizumab (KEYTRUDA IV) Inject into the vein.    . predniSONE (DELTASONE) 20 MG tablet Take at the onset of diarrhea.  Take 5 tablets (100 mg) at one time.  Then call the [facility]. 30 tablet 0  . prochlorperazine (COMPAZINE) 10 MG tablet   1  . simvastatin (ZOCOR) 20 MG tablet Take 20 mg by mouth every evening.  5  . Skin Protectants, Misc. (EUCERIN) cream Apply topically as needed for dry skin. 454 g 1  . spironolactone (ALDACTONE) 50 MG tablet Take 1 tablet (50 mg total) by mouth daily. 10 tablet 0   No current facility-administered medications for this visit.     Review of Systems: Review of Systems  Constitutional: Positive for appetite change.  Cardiovascular: Positive for chest pain.  Gastrointestinal: Positive for diarrhea. Negative for nausea.  All other systems reviewed and are negative.    PHYSICAL EXAMINATION Blood pressure (!) 157/109, pulse 85, temperature 97.7 F (36.5 C), temperature source Oral, resp. rate 20, height '5\' 11"'  (1.803 m), weight 232 lb (105.2 kg), SpO2 95 %.  ECOG PERFORMANCE STATUS: 2 - Symptomatic, <50% confined to bed  Physical Exam  Constitutional: He is oriented to person, place, and time and well-developed, well-nourished, and in no distress. No distress.  HENT:  Head: Normocephalic and atraumatic.  Mouth/Throat: Oropharynx is clear and moist.  Eyes: Conjunctivae and EOM are normal. Pupils are equal, round, and reactive to light. No scleral icterus.  Neck: Normal range of motion. No thyromegaly present.  Cardiovascular: Normal rate, regular rhythm and normal heart sounds.  No murmur heard. Pulmonary/Chest: Effort normal and breath sounds normal. No respiratory distress. He has no wheezes. He has no rales.  Abdominal: Soft. Bowel sounds are normal. He exhibits no distension. There is no tenderness. There is no rebound.  Musculoskeletal: He exhibits  no edema.  Lymphadenopathy:    He has no cervical adenopathy.  Neurological: He is alert and oriented to person, place, and time. He has normal reflexes. No cranial nerve deficit.  Skin: Skin is warm and dry. No rash noted. He is not diaphoretic. No erythema.     LABORATORY DATA: I have personally reviewed the data  as listed: Infusion on 05/13/2017  Component Date Value Ref Range Status  . TSH 05/13/2017 2.821  0.350 - 4.500 uIU/mL Final   Performed by a 3rd Generation assay with a functional sensitivity of <=0.01 uIU/mL.  . WBC 05/13/2017 12.0* 4.0 - 10.5 K/uL Final  . RBC 05/13/2017 5.13  4.22 - 5.81 MIL/uL Final  . Hemoglobin 05/13/2017 14.6  13.0 - 17.0 g/dL Final  . HCT 05/13/2017 44.6  39.0 - 52.0 % Final  . MCV 05/13/2017 86.9  78.0 - 100.0 fL Final  . MCH 05/13/2017 28.5  26.0 - 34.0 pg Final  . MCHC 05/13/2017 32.7  30.0 - 36.0 g/dL Final  . RDW 05/13/2017 13.8  11.5 - 15.5 % Final  . Platelets 05/13/2017 273  150 - 400 K/uL Final  . Neutrophils Relative % 05/13/2017 44  % Final  . Neutro Abs 05/13/2017 5.1  1.7 - 7.7 K/uL Final  . Lymphocytes Relative 05/13/2017 41  % Final  . Lymphs Abs 05/13/2017 4.9* 0.7 - 4.0 K/uL Final  . Monocytes Relative 05/13/2017 8  % Final  . Monocytes Absolute 05/13/2017 1.0  0.1 - 1.0 K/uL Final  . Eosinophils Relative 05/13/2017 6  % Final  . Eosinophils Absolute 05/13/2017 0.8* 0.0 - 0.7 K/uL Final  . Basophils Relative 05/13/2017 1  % Final  . Basophils Absolute 05/13/2017 0.1  0.0 - 0.1 K/uL Final  . Sodium 05/13/2017 137  135 - 145 mmol/L Final  . Potassium 05/13/2017 3.8  3.5 - 5.1 mmol/L Final  . Chloride 05/13/2017 104  101 - 111 mmol/L Final  . CO2 05/13/2017 23  22 - 32 mmol/L Final  . Glucose, Bld 05/13/2017 101* 65 - 99 mg/dL Final  . BUN 05/13/2017 17  6 - 20 mg/dL Final  . Creatinine, Ser 05/13/2017 1.14  0.61 - 1.24 mg/dL Final  . Calcium 05/13/2017 8.9  8.9 - 10.3 mg/dL Final  . Total Protein 05/13/2017 6.8  6.5 - 8.1  g/dL Final  . Albumin 05/13/2017 4.0  3.5 - 5.0 g/dL Final  . AST 05/13/2017 17  15 - 41 U/L Final  . ALT 05/13/2017 15* 17 - 63 U/L Final  . Alkaline Phosphatase 05/13/2017 76  38 - 126 U/L Final  . Total Bilirubin 05/13/2017 0.5  0.3 - 1.2 mg/dL Final  . GFR calc non Af Amer 05/13/2017 >60  >60 mL/min Final  . GFR calc Af Amer 05/13/2017 >60  >60 mL/min Final   Comment: (NOTE) The eGFR has been calculated using the CKD EPI equation. This calculation has not been validated in all clinical situations. eGFR's persistently <60 mL/min signify possible Chronic Kidney Disease.   Georgiann Hahn gap 05/13/2017 10  5 - 15 Final       Ardath Sax, MD

## 2017-05-23 NOTE — Assessment & Plan Note (Signed)
72 y.o. male with diagnosis of leiomyosarcoma of the right chest wall as well as a stage IV adenocarcinoma of the lung, currently undergoing palliative systemic immunotherapy with pembrolizumab.  Patient has stable but significant complaints including pain, also moderate diarrhea both of which are well controlled on the current treatment.  Overall, clinical evaluation and lab work are permissive to proceed with the next cycle of immunotherapy.  Plan: -- Increase supply of oxycodone/acetaminophen to 50 pills every 2 weeks.  If further intensification of pain management is desired, we could start patient on OxyContin -- Proceed with the next cycle of immunotherapy -- Restaging CT of the chest/abdomen/pelvis in February --Return to clinic in 3 weeks with labs and cycle #20 of pembrolizumab (Keytruda).

## 2017-05-27 DIAGNOSIS — E1165 Type 2 diabetes mellitus with hyperglycemia: Secondary | ICD-10-CM | POA: Diagnosis not present

## 2017-05-27 DIAGNOSIS — I10 Essential (primary) hypertension: Secondary | ICD-10-CM | POA: Diagnosis not present

## 2017-05-27 DIAGNOSIS — E782 Mixed hyperlipidemia: Secondary | ICD-10-CM | POA: Diagnosis not present

## 2017-05-29 DIAGNOSIS — I10 Essential (primary) hypertension: Secondary | ICD-10-CM | POA: Diagnosis not present

## 2017-05-29 DIAGNOSIS — E1165 Type 2 diabetes mellitus with hyperglycemia: Secondary | ICD-10-CM | POA: Diagnosis not present

## 2017-05-29 DIAGNOSIS — C3411 Malignant neoplasm of upper lobe, right bronchus or lung: Secondary | ICD-10-CM | POA: Diagnosis not present

## 2017-05-29 DIAGNOSIS — E782 Mixed hyperlipidemia: Secondary | ICD-10-CM | POA: Diagnosis not present

## 2017-06-03 ENCOUNTER — Inpatient Hospital Stay (HOSPITAL_COMMUNITY): Payer: Medicare Other | Admitting: Internal Medicine

## 2017-06-03 ENCOUNTER — Inpatient Hospital Stay (HOSPITAL_COMMUNITY): Payer: Medicare Other

## 2017-06-03 ENCOUNTER — Encounter (HOSPITAL_COMMUNITY): Payer: Self-pay

## 2017-06-03 VITALS — BP 159/87 | HR 50 | Temp 97.7°F | Resp 20 | Wt 231.0 lb

## 2017-06-03 VITALS — BP 165/89 | HR 51 | Temp 97.7°F | Resp 20

## 2017-06-03 DIAGNOSIS — Z5111 Encounter for antineoplastic chemotherapy: Secondary | ICD-10-CM | POA: Diagnosis not present

## 2017-06-03 DIAGNOSIS — C3411 Malignant neoplasm of upper lobe, right bronchus or lung: Secondary | ICD-10-CM

## 2017-06-03 DIAGNOSIS — Z79899 Other long term (current) drug therapy: Secondary | ICD-10-CM

## 2017-06-03 DIAGNOSIS — R197 Diarrhea, unspecified: Secondary | ICD-10-CM

## 2017-06-03 DIAGNOSIS — F17210 Nicotine dependence, cigarettes, uncomplicated: Secondary | ICD-10-CM

## 2017-06-03 DIAGNOSIS — G893 Neoplasm related pain (acute) (chronic): Secondary | ICD-10-CM | POA: Diagnosis not present

## 2017-06-03 DIAGNOSIS — R0602 Shortness of breath: Secondary | ICD-10-CM

## 2017-06-03 LAB — CBC WITH DIFFERENTIAL/PLATELET
Basophils Absolute: 0.1 10*3/uL (ref 0.0–0.1)
Basophils Relative: 1 %
Eosinophils Absolute: 0.6 10*3/uL (ref 0.0–0.7)
Eosinophils Relative: 6 %
HCT: 43.7 % (ref 39.0–52.0)
Hemoglobin: 14.3 g/dL (ref 13.0–17.0)
Lymphocytes Relative: 41 %
Lymphs Abs: 4.3 10*3/uL — ABNORMAL HIGH (ref 0.7–4.0)
MCH: 28.4 pg (ref 26.0–34.0)
MCHC: 32.7 g/dL (ref 30.0–36.0)
MCV: 86.7 fL (ref 78.0–100.0)
Monocytes Absolute: 0.9 10*3/uL (ref 0.1–1.0)
Monocytes Relative: 9 %
Neutro Abs: 4.7 10*3/uL (ref 1.7–7.7)
Neutrophils Relative %: 45 %
Platelets: 267 10*3/uL (ref 150–400)
RBC: 5.04 MIL/uL (ref 4.22–5.81)
RDW: 13.8 % (ref 11.5–15.5)
WBC: 10.6 10*3/uL — ABNORMAL HIGH (ref 4.0–10.5)

## 2017-06-03 LAB — COMPREHENSIVE METABOLIC PANEL
ALT: 14 U/L — ABNORMAL LOW (ref 17–63)
AST: 17 U/L (ref 15–41)
Albumin: 4 g/dL (ref 3.5–5.0)
Alkaline Phosphatase: 76 U/L (ref 38–126)
Anion gap: 12 (ref 5–15)
BUN: 17 mg/dL (ref 6–20)
CO2: 21 mmol/L — ABNORMAL LOW (ref 22–32)
Calcium: 8.8 mg/dL — ABNORMAL LOW (ref 8.9–10.3)
Chloride: 105 mmol/L (ref 101–111)
Creatinine, Ser: 1.28 mg/dL — ABNORMAL HIGH (ref 0.61–1.24)
GFR calc Af Amer: >60 mL/min (ref >60)
GFR calc non Af Amer: 54 mL/min — ABNORMAL LOW (ref >60)
Glucose, Bld: 117 mg/dL — ABNORMAL HIGH (ref 65–99)
Potassium: 3.7 mmol/L (ref 3.5–5.1)
Sodium: 138 mmol/L (ref 135–145)
Total Bilirubin: 0.6 mg/dL (ref 0.3–1.2)
Total Protein: 7 g/dL (ref 6.5–8.1)

## 2017-06-03 LAB — RAPID URINE DRUG SCREEN, HOSP PERFORMED
Amphetamines: NOT DETECTED
Barbiturates: NOT DETECTED
Benzodiazepines: POSITIVE — AB
Cocaine: NOT DETECTED
Opiates: NOT DETECTED
Tetrahydrocannabinol: NOT DETECTED

## 2017-06-03 LAB — TSH: TSH: 3.018 u[IU]/mL (ref 0.350–4.500)

## 2017-06-03 LAB — LACTATE DEHYDROGENASE: LDH: 144 U/L (ref 98–192)

## 2017-06-03 MED ORDER — SODIUM CHLORIDE 0.9% FLUSH
10.0000 mL | INTRAVENOUS | Status: DC | PRN
  Administered 2017-06-03: 10 mL
  Filled 2017-06-03: qty 10

## 2017-06-03 MED ORDER — SODIUM CHLORIDE 0.9 % IV SOLN
Freq: Once | INTRAVENOUS | Status: AC
  Administered 2017-06-03: 11:00:00 via INTRAVENOUS

## 2017-06-03 MED ORDER — METHYLPREDNISOLONE 4 MG PO TBPK: ORAL_TABLET | ORAL | 0 refills | Status: DC

## 2017-06-03 MED ORDER — HEPARIN SOD (PORK) LOCK FLUSH 100 UNIT/ML IV SOLN
500.0000 [IU] | Freq: Once | INTRAVENOUS | Status: AC | PRN
  Administered 2017-06-03: 500 [IU]

## 2017-06-03 MED ORDER — SODIUM CHLORIDE 0.9 % IV SOLN
200.0000 mg | Freq: Once | INTRAVENOUS | Status: AC
  Administered 2017-06-03: 200 mg via INTRAVENOUS
  Filled 2017-06-03: qty 8

## 2017-06-03 NOTE — Progress Notes (Signed)
To treatment area for oncology follow up and chemotherapy.  Patient stated decreased appetite and continues to have itching on back.  Patient also stated he does not read well and unable to tell me accurately what medicines he takes on a daily basis.  Reviewed medications with the patient verbally but the patient is still unsure of medications.  Recommended the bubble packs with his pharmacy.  Stated pain in chest area from "cancer" and rates 5 on scale.  Uses home medications for relief.    Patient tolerated therapy with no complaints voiced.  Port site clean and dry with no bruising or swelling noted at site.  Band aid applied.  VSS with discharge and left ambulatory with no s/s of distress noted.  Instructed the patient to call for urine results for narcotic refill.  Verbalized understanding.

## 2017-06-03 NOTE — Progress Notes (Signed)
[facility] Follow up:   Celene Squibb, MD [place] [address]   DIAGNOSIS: Cancer Staging Primary cancer of right upper lobe of lung [facility]) Staging form: Lung, AJCC 8th Edition - Clinical: Stage IVB (cT3, cN2, cM1c) - Signed by Baird Cancer, PA-C on 05/07/2016  Assessment and plan:  1.  Stage 4 adenocarcinoma of lung.  Patient is currently being treated with palliative therapy with pembrolizumab.  He is tolerating therapy without significant problems.  Labs are adequate for chemotherapy.  He will proceed with treatment today he will return to clinic for follow-up prior to his next cycle.  He is planned for repeat staging in February and this will be scheduled.  2.  Diarrhea.  He reports this is not significant.  He had previously been placed on prednisone.  He will be placed on a Medrol Dosepak for tapering.  He is advised to notify the office if diarrhea worsens.  3.  Neuropathy.  He reports this occurs occasionally.  We will continue to monitor this.  4.  Shortness of breath.  He reports this occurs occasionally with exertion.  Oxygen saturation was 98% on room air.   Interval history: 72 y.o. male with diagnosis of leiomyosarcoma of the right chest wall as well as a stage IV adenocarcinoma of the lung, currently undergoing palliative systemic immunotherapy with pembrolizumab.    Current Status: Patient seen today  for follow-up.  He reports some occasional neuropathy and also occasional diarrhea but denies diarrhea significant.  He reports a mild rash and occasional shortness of breath on exertion.  He reports he had previously been placed on prednisone but not sure if he needs to continue taking this.    SUMMARY OF ONCOLOGIC HISTORY:   Primary cancer of right upper lobe of lung ([place])   05/14/2015 Procedure    Port placement by Dr. Arnoldo Morale      04/01/2016 Initial Diagnosis    Primary cancer of right upper lobe of lung ([place])       04/02/2016 Procedure    Ultrasound-guided core biopsy performed of a solid 1.5 cm soft tissue nodule in the right chest wall.      04/03/2016 Imaging    MRI brain- No acute and intracranial process or metastasis.  Moderate chronic small vessel ischemic disease.      04/04/2016 Pathology Results    Diagnosis Soft Tissue Needle Core Biopsy, Right Chest Wall SMOOTH MUSCLE NEOPLASM Microscopic Comment The differential diagnosis include low grade leiomyosarcoma. The neoplasm shows spindle cell morphology with rare mitotic figures and minimal atypia, no necrosis present. These cells stains positive for smooth muscle actin, desmin, negative for cytokeratin AE1&3, ck8/18, s100, cd117, cd 34 and cd99. This case also reviewed by Dr. [name] and agree.      04/05/2016 Procedure    CT-guided biopsy of right upper lobe lesion, with tissue specimen sent to pathology, by IR.      04/09/2016 Pathology Results    Lung, needle/core biopsy(ies), Right Upper Lobe - NON SMALL CELL CARCINOMA.      04/17/2016 Pathology Results    PDL1 High Expression.  TPS 50%.      04/24/2016 PET scan    1. Prominently hypermetabolic large right upper lobe mass with metastatic disease to the right paratracheal and right hilar nodal chains, to the descending mesocolon, to the left upper buttock subcutaneous tissues, to the left upper lobe, to the right subscapularis muscle, and possibly to the left  adrenal gland and right breast subcutaneous tissues. Appearance compatible with stage IV lung cancer. 2. There is some focal high activity in the ascending colon which is probably from peristalsis, less likely from a local colon mass. This does raise the possibility that the adjacent mesocolon tumor implant could be due to synchronous colon cancer rather than lung metastasis. 3. Other imaging findings of potential clinical significance: Coronary, aortic arch, and branch vessel atherosclerotic vascular disease.  Aortoiliac atherosclerotic vascular disease. Deformity left proximal humerus and left glenohumeral joint from prior trauma. Enlarged prostate gland.      04/26/2016 Pathology Results    [facility]: Genomic alterations identified- KRAS V42V, CREBBP splice site [id number], LRP1B S515*, IEP32 R51*, OAC16 splice site [id number], SPTA1 splice site [id number], TP53 C135Y.  Additional findings- MS-Stable, TMB-intermediate (14 Muts/Mb).  No reportable alterations- EGFR, ALK, BRAF, MET, ERBB2, RET, ROS1.      05/17/2016 -  Chemotherapy    The patient had ONDANSETRON IVPB [facility] +/- DEXAMETHASONE, , Intravenous,  Once, 0 of 1 cycle  pembrolizumab (KEYTRUDA) 200 mg in sodium chloride 0.9 % 50 mL chemo infusion, 200 mg, Intravenous, Once, 0 of 5 cycles  for chemotherapy treatment.        08/06/2016 PET scan    IMPRESSION: 1. Overall considerable improvement. The right upper lobe mass is markedly reduced in volume and SUV. Thoracic adenopathy have resolved and the hypermetabolic lesion in the right subscapularis muscle has also resolved. 2. The left upper lobe nodule is no longer appreciably hypermetabolic, and is highly indistinct although this may be due to motion artifact. Faint in indistinct nodularity elsewhere in the lungs likewise not currently hypermetabolic. 3. There is some very faint residual low-grade activity along the subcutaneous deposit along the upper left buttock region. Marked reduction in size and activity of the tumor deposit adjacent to the descending colon. 4. Prior right pleural effusion has resolved. 5. Stable appearance of the adrenal glands, with low-level activity and a nodule in the left adrenal gland. 6. Stable small nodule in the right breast, low-grade metabolic activity with maximum SUV 2.2 (formerly 3.0) 7. Other imaging findings of potential clinical significance: Severe arthropathy of the left glenohumeral joint. Coronary, aortic arch, and branch vessel  atherosclerotic vascular disease. Aortoiliac atherosclerotic vascular disease. Prominent prostate gland indents the bladder base.        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  Two adjacent large masslike areas of consolidation within the right upper lobe with differential considerations including malignancy or pneumonia. Multiple enlarged right hilar and mediastinal lymph nodes which may be reactive or metastatic in etiology.  Additional indeterminate pulmonary nodules as above. Recommend attention on follow-up.  Indeterminate 2.9 cm left adrenal nodule. This needs dedicated evaluation with pre and post contrast-enhanced CT or MRI after confirmation of pulmonary process.  Indeterminate nodule within the right breast. Recommend dedicated evaluation with mammography.  Hepatic steatosis.      04/02/2016 Procedure    Ultrasound-guided core biopsy performed of a solid 1.5 cm soft tissue nodule in the right chest wall.      04/04/2016 Pathology Results    Diagnosis Soft Tissue Needle Core Biopsy, Right Chest Wall SMOOTH MUSCLE NEOPLASM Microscopic Comment The differential diagnosis include low grade leiomyosarcoma. The neoplasm shows spindle cell morphology with rare mitotic figures and minimal atypia, no necrosis present. These cells stains positive for smooth muscle actin,  desmin, negative for cytokeratin AE1&3, ck8/18, s100, cd117, cd 34 and cd99. This case also reviewed by Dr. [name] and agree.        CURRENT THERAPY: Keytruda  Patient Active Problem List   Diagnosis Date Noted  . Itching 10/10/2016  . Diabetes mellitus ([place]) 05/02/2016  . 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[place])   . Primary cancer of right upper lobe of lung ([place]) 04/01/2016  . Breast nodule 04/01/2016  . Adrenal mass, left ([place]) 04/01/2016  . HTN (hypertension),  benign 04/01/2016  . Pulmonary embolus ([place]) 03/31/2016   NKDA  MEDICAL HISTORY: Past Medical History:  Diagnosis Date  . Adrenal mass, left ([place]) 04/01/2016  . Diabetes mellitus ([place]) 05/02/2016  . Diabetes mellitus without complication ([place])   . Hypertension   . Mass of breast, right 04/01/2016  . Mass of upper lobe of right lung 04/01/2016   2 adjacent, 5 cm masses, hilar adenopathy  . Primary cancer of right upper lobe of lung ([place]) 04/01/2016   2 adjacent, 5 cm masses, hilar adenopathy  . Pulmonary embolus ([place]) 03/31/2016  . 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[place])     SURGICAL HISTORY: Past Surgical History:  Procedure Laterality Date  . PORTACATH PLACEMENT Left 05/13/2016   Procedure: INSERTION PORT-A-CATH;  Surgeon: Aviva Signs, MD;  Location: [facility];  Service: General;  Laterality: Left;    SOCIAL HISTORY: Social History   Socioeconomic History  . Marital status: Divorced    Spouse name: Not on file  . Number of children: Not on file  . Years of education: Not on file  . Highest education level: Not on file  Social Needs  . Financial resource strain: Not on file  . Food insecurity - worry: Not on file  . Food insecurity - inability: Not on file  . Transportation needs - medical: Not on file  . Transportation needs - non-medical: Not on file  Occupational History  . Not on file  Tobacco Use  . Smoking status: Current Every Day Smoker    Years: 61.00    Types: E-cigarettes    Last attempt to quit: 04/11/2014    Years since quitting: 3.1  . Smokeless tobacco: Never Used  . Tobacco comment: patient does vape smoking x 2 years  Substance and Sexual Activity  . Alcohol use: No    Comment: quit 10 years  . Drug use: No  . Sexual activity: Not on file  Other Topics Concern  . Not on file  Social History Narrative  . Not on file    FAMILY HISTORY:  No family history of malignancy on file.    Prior to Admission medications   Medication Sig Start Date End Date  Taking? Authorizing Provider  ALPRAZolam [name]) 1 MG tablet Take 1 mg by mouth 3 (three) times daily as needed for anxiety.    [provider]  amLODipine (NORVASC) 2.5 MG tablet Take 2.5 mg by mouth daily. 03/22/16   [provider]  diphenoxylate-atropine (LOMOTIL) 2.5-0.025 MG tablet Take 1 tablet 4 (four) times daily as needed by mouth for diarrhea or loose stools. 03/11/17   Twana First, MD  furosemide (LASIX) 20 MG tablet Take 2 tablets (40 mg total) by mouth daily. 11/25/16   Baird Cancer, PA-C  GLIPIZIDE XL 5 MG 24 hr tablet Take 5 mg by mouth daily. 03/22/16   [provider]  hydrocortisone cream 0.5 % Apply 1 application topically 2 (two) times daily. 05/24/16   Baird Cancer, PA-C  hydrOXYzine (  ATARAX/VISTARIL) 25 MG tablet Take 1 tablet (25 mg total) by mouth 3 (three) times daily as needed. 10/14/16   Forest Gleason, MD  lidocaine-prilocaine (EMLA) cream Apply a quarter size amount to affected area 1 hour prior to coming to chemotherapy. Cover with plastic wrap. 05/13/17   Ardath Sax, MD  metFORMIN (GLUCOPHAGE) 500 MG tablet Take 500 mg by mouth 2 (two) times daily. 03/07/16   [provider]  methylPREDNISolone (MEDROL DOSEPAK) 4 MG TBPK tablet Take as pharmacy directed. 06/03/17   Weslee Prestage, Mathis Dad, MD  metoprolol succinate (TOPROL-XL) 25 MG 24 hr tablet TAKE ONE TABLET BY MOUTH IN THE EVENING. 03/26/17   Holley Bouche, NP  naproxen (NAPROSYN) 500 MG tablet Take 1 tablet (500 mg total) by mouth 2 (two) times daily with a meal. 09/23/16   Kefalas, Manon Hilding, PA-C  ondansetron (ZOFRAN) 8 MG tablet Take 8 mg by mouth every 8 (eight) hours as needed for nausea or vomiting.    [provider]  Pembrolizumab (KEYTRUDA IV) Inject into the vein.    [provider]  predniSONE (DELTASONE) 20 MG tablet Take at the onset of diarrhea.  Take 5 tablets (100 mg) at one time.  Then call the [place]. 01/28/17   Twana First, MD   prochlorperazine (COMPAZINE) 10 MG tablet  05/08/16   [provider]  simvastatin (ZOCOR) 20 MG tablet Take 20 mg by mouth every evening. 03/07/16   [provider]  Skin Protectants, Misc. (EUCERIN) cream Apply topically as needed for dry skin. 08/09/16   Forest Gleason, MD  spironolactone (ALDACTONE) 50 MG tablet Take 1 tablet (50 mg total) by mouth daily. 11/25/16   Baird Cancer, PA-C    Review of Systems - Oncology Negative other than SOB, Minor diarrhea, occasional neuropathy and mild rash and itching.     PHYSICAL EXAMINATION   ECOG PERFORMANCE STATUS: 1  Vitals:   06/03/17 0946  BP: (!) 159/87  Pulse: (!) 50  Resp: 20  Temp: 97.7 F (36.5 C)  SpO2: 95%    Physical Exam  Constitutional: He appears well-developed and well-nourished.  HENT:  Head: Normocephalic.  Eyes: EOM are normal. Pupils are equal, round, and reactive to light.  Neck: Neck supple.  Cardiovascular: Regular rhythm.  Pulmonary/Chest: Breath sounds normal.  Abdominal: Soft.  Musculoskeletal: Normal range of motion.  Neurological: He is alert.  Skin: Skin is warm.  Psychiatric: He has a normal mood and affect.    LABORATORY DATA:  CBC    Component Value Date/Time   WBC 10.6 (H) 06/03/2017 0919   RBC 5.04 06/03/2017 0919   HGB 14.3 06/03/2017 0919   HGB 13.5 05/02/2016 0852   HCT 43.7 06/03/2017 0919   HCT 41.0 05/02/2016 0852   PLT 267 06/03/2017 0919   PLT 436 (H) 05/02/2016 0852   MCV 86.7 06/03/2017 0919   MCV 85.7 05/02/2016 0852   MCH 28.4 06/03/2017 0919   MCHC 32.7 06/03/2017 0919   RDW 13.8 06/03/2017 0919   RDW 13.9 05/02/2016 0852   LYMPHSABS 4.3 (H) 06/03/2017 0919   LYMPHSABS 3.4 (H) 05/02/2016 0852   MONOABS 0.9 06/03/2017 0919   MONOABS 1.2 (H) 05/02/2016 0852   EOSABS 0.6 06/03/2017 0919   EOSABS 1.6 (H) 05/02/2016 0852   BASOSABS 0.1 06/03/2017 0919   BASOSABS 0.2 (H) 05/02/2016 0852    CMP     Component Value Date/Time   NA 138 06/03/2017  0919   NA 139 05/02/2016 1572  K 3.7 06/03/2017 0919   K 4.2 05/02/2016 0852   CL 105 06/03/2017 0919   CO2 21 (L) 06/03/2017 0919   CO2 24 05/02/2016 0852   GLUCOSE 117 (H) 06/03/2017 0919   GLUCOSE 103 05/02/2016 0852   BUN 17 06/03/2017 0919   BUN 10.6 05/02/2016 0852   CREATININE 1.28 (H) 06/03/2017 0919   CREATININE 1.0 05/02/2016 0852   CALCIUM 8.8 (L) 06/03/2017 0919   CALCIUM 9.2 05/02/2016 0852   PROT 7.0 06/03/2017 0919   PROT 7.5 05/02/2016 0852   ALBUMIN 4.0 06/03/2017 0919   ALBUMIN 3.1 (L) 05/02/2016 0852   AST 17 06/03/2017 0919   AST 12 05/02/2016 0852   ALT 14 (L) 06/03/2017 0919   ALT 14 05/02/2016 0852   ALKPHOS 76 06/03/2017 0919   ALKPHOS 73 05/02/2016 0852   BILITOT 0.6 06/03/2017 0919   BILITOT 0.42 05/02/2016 0852   GFRNONAA 54 (L) 06/03/2017 0919   GFRAA >60 06/03/2017 0919    ASSESSMENT and THERAPY PLAN: As above  Orders Placed This Encounter  Procedures  . Lactate dehydrogenase    Standing Status:   Standing    Number of Occurrences:   1  . CBC with Differential    Standing Status:   Standing    Number of Occurrences:   2    Standing Expiration Date:   06/03/2018  . Comprehensive metabolic panel    Standing Status:   Standing    Number of Occurrences:   2    Standing Expiration Date:   06/03/2018  . Lactate dehydrogenase    Standing Status:   Standing    Number of Occurrences:   2    Standing Expiration Date:   06/03/2018    All questions were answered. The patient knows to call the clinic with any problems, questions or concerns. We can certainly see the patient much sooner if necessary. This note was electronically signed.  , MD 06/03/2017 

## 2017-06-03 NOTE — Patient Instructions (Signed)
[facility] Discharge Instructions for Patients Receiving Chemotherapy  Today you received the following chemotherapy agents keytruda.    If you develop nausea and vomiting that is not controlled by your nausea medication, call the clinic.   BELOW ARE SYMPTOMS THAT SHOULD BE REPORTED IMMEDIATELY:  *FEVER GREATER THAN 100.5 F  *CHILLS WITH OR WITHOUT FEVER  NAUSEA AND VOMITING THAT IS NOT CONTROLLED WITH YOUR NAUSEA MEDICATION  *UNUSUAL SHORTNESS OF BREATH  *UNUSUAL BRUISING OR BLEEDING  TENDERNESS IN MOUTH AND THROAT WITH OR WITHOUT PRESENCE OF ULCERS  *URINARY PROBLEMS  *BOWEL PROBLEMS  UNUSUAL RASH Items with * indicate a potential emergency and should be followed up as soon as possible.  Feel free to call the clinic should you have any questions or concerns. The clinic phone number is [phone].  Please show the [facility] at check-in to the Emergency Department and triage nurse.

## 2017-06-03 NOTE — Progress Notes (Signed)
Patient made aware of Medrol dose pack called into pharmacy during treatment and instructed to stop the prednisone medication.  Verbalized understanding.  Instructed the patient to take medication list from the [facility] and have daughter to review medications and update list so the CC can update in Epic.  Patient verbalized understanding.

## 2017-06-04 ENCOUNTER — Telehealth (HOSPITAL_COMMUNITY): Payer: Self-pay

## 2017-06-04 ENCOUNTER — Other Ambulatory Visit (HOSPITAL_COMMUNITY): Payer: Self-pay | Admitting: Oncology

## 2017-06-04 DIAGNOSIS — C3411 Malignant neoplasm of upper lobe, right bronchus or lung: Secondary | ICD-10-CM

## 2017-06-04 DIAGNOSIS — L299 Pruritus, unspecified: Secondary | ICD-10-CM

## 2017-06-04 NOTE — Telephone Encounter (Signed)
Spoke with the patient regarding pain medication refill.  Instructed the patient he will need to call his PCP or get a pain clinic referral for pain medication per communication note.  Patient verbalized understanding.  The patient has not picked up his medrol dose pack.  Instructed the patient to go to his pharmacy and start this medication as directed and to stop the prednisone as directed in yesterdays visit.  Patient verbalized understanding.

## 2017-06-04 NOTE — Telephone Encounter (Signed)
-----   Message from Gerhard Perches, RN sent at 06/03/2017  2:01 PM EST ----- Will not refill pain pills - he needs to go to PCP or we can refer to pain management  Treat today  Keytruda, labs, and OV in 3 weeks with Dr. Walden Field  Labs: CBC diff/CMP, LDH in 3 weeks  Medrol dose pak called in

## 2017-06-18 ENCOUNTER — Other Ambulatory Visit (HOSPITAL_COMMUNITY): Payer: Self-pay

## 2017-06-18 DIAGNOSIS — G8929 Other chronic pain: Secondary | ICD-10-CM | POA: Diagnosis not present

## 2017-06-18 DIAGNOSIS — C3411 Malignant neoplasm of upper lobe, right bronchus or lung: Secondary | ICD-10-CM

## 2017-06-18 DIAGNOSIS — Z712 Person consulting for explanation of examination or test findings: Secondary | ICD-10-CM | POA: Diagnosis not present

## 2017-06-18 DIAGNOSIS — L299 Pruritus, unspecified: Secondary | ICD-10-CM

## 2017-06-18 MED ORDER — HYDROXYZINE HCL 25 MG PO TABS: 25.0000 mg | ORAL_TABLET | Freq: Three times a day (TID) | ORAL | 3 refills | Status: DC | PRN

## 2017-06-18 NOTE — Telephone Encounter (Signed)
Received refill request from patients pharmacy for hydroxyzine. Reviewed with provider,chart checked and refilled.

## 2017-06-24 ENCOUNTER — Inpatient Hospital Stay (HOSPITAL_COMMUNITY): Payer: Medicare Other | Attending: Adult Health | Admitting: Oncology

## 2017-06-24 ENCOUNTER — Encounter (HOSPITAL_COMMUNITY): Payer: Self-pay | Admitting: Oncology

## 2017-06-24 ENCOUNTER — Encounter: Payer: Self-pay | Admitting: Oncology

## 2017-06-24 ENCOUNTER — Inpatient Hospital Stay (HOSPITAL_COMMUNITY): Payer: Medicare Other

## 2017-06-24 ENCOUNTER — Ambulatory Visit (HOSPITAL_COMMUNITY): Payer: Medicare Other | Admitting: Adult Health

## 2017-06-24 ENCOUNTER — Encounter (HOSPITAL_COMMUNITY): Payer: Self-pay

## 2017-06-24 VITALS — BP 178/90 | HR 81 | Temp 97.6°F | Resp 18 | Wt 226.0 lb

## 2017-06-24 VITALS — BP 156/89 | HR 89 | Temp 97.6°F | Resp 18 | Wt 226.0 lb

## 2017-06-24 DIAGNOSIS — C3411 Malignant neoplasm of upper lobe, right bronchus or lung: Secondary | ICD-10-CM | POA: Insufficient documentation

## 2017-06-24 DIAGNOSIS — L570 Actinic keratosis: Secondary | ICD-10-CM | POA: Diagnosis not present

## 2017-06-24 DIAGNOSIS — R197 Diarrhea, unspecified: Secondary | ICD-10-CM | POA: Diagnosis not present

## 2017-06-24 DIAGNOSIS — F17210 Nicotine dependence, cigarettes, uncomplicated: Secondary | ICD-10-CM

## 2017-06-24 DIAGNOSIS — G629 Polyneuropathy, unspecified: Secondary | ICD-10-CM | POA: Insufficient documentation

## 2017-06-24 DIAGNOSIS — D72829 Elevated white blood cell count, unspecified: Secondary | ICD-10-CM | POA: Insufficient documentation

## 2017-06-24 DIAGNOSIS — Z79899 Other long term (current) drug therapy: Secondary | ICD-10-CM

## 2017-06-24 DIAGNOSIS — Z5111 Encounter for antineoplastic chemotherapy: Secondary | ICD-10-CM | POA: Diagnosis not present

## 2017-06-24 DIAGNOSIS — R0602 Shortness of breath: Secondary | ICD-10-CM | POA: Diagnosis not present

## 2017-06-24 DIAGNOSIS — E119 Type 2 diabetes mellitus without complications: Secondary | ICD-10-CM | POA: Diagnosis not present

## 2017-06-24 DIAGNOSIS — Z86711 Personal history of pulmonary embolism: Secondary | ICD-10-CM | POA: Insufficient documentation

## 2017-06-24 DIAGNOSIS — I10 Essential (primary) hypertension: Secondary | ICD-10-CM

## 2017-06-24 DIAGNOSIS — Z7984 Long term (current) use of oral hypoglycemic drugs: Secondary | ICD-10-CM | POA: Diagnosis not present

## 2017-06-24 DIAGNOSIS — K760 Fatty (change of) liver, not elsewhere classified: Secondary | ICD-10-CM | POA: Diagnosis not present

## 2017-06-24 LAB — CBC WITH DIFFERENTIAL/PLATELET
Basophils Absolute: 0.1 10*3/uL (ref 0.0–0.1)
Basophils Relative: 1 %
Eosinophils Absolute: 0.4 10*3/uL (ref 0.0–0.7)
Eosinophils Relative: 3 %
HCT: 46.8 % (ref 39.0–52.0)
Hemoglobin: 15.2 g/dL (ref 13.0–17.0)
Lymphocytes Relative: 44 %
Lymphs Abs: 6.1 10*3/uL — ABNORMAL HIGH (ref 0.7–4.0)
MCH: 28.3 pg (ref 26.0–34.0)
MCHC: 32.5 g/dL (ref 30.0–36.0)
MCV: 87 fL (ref 78.0–100.0)
Monocytes Absolute: 1 10*3/uL (ref 0.1–1.0)
Monocytes Relative: 7 %
Neutro Abs: 6.3 10*3/uL (ref 1.7–7.7)
Neutrophils Relative %: 45 %
Platelets: 272 10*3/uL (ref 150–400)
RBC: 5.38 MIL/uL (ref 4.22–5.81)
RDW: 14.1 % (ref 11.5–15.5)
WBC: 13.9 10*3/uL — ABNORMAL HIGH (ref 4.0–10.5)

## 2017-06-24 LAB — LACTATE DEHYDROGENASE: LDH: 155 U/L (ref 98–192)

## 2017-06-24 LAB — COMPREHENSIVE METABOLIC PANEL
ALT: 16 U/L — ABNORMAL LOW (ref 17–63)
AST: 18 U/L (ref 15–41)
Albumin: 4 g/dL (ref 3.5–5.0)
Alkaline Phosphatase: 83 U/L (ref 38–126)
Anion gap: 10 (ref 5–15)
BUN: 18 mg/dL (ref 6–20)
CO2: 26 mmol/L (ref 22–32)
Calcium: 8.9 mg/dL (ref 8.9–10.3)
Chloride: 102 mmol/L (ref 101–111)
Creatinine, Ser: 1.1 mg/dL (ref 0.61–1.24)
GFR calc Af Amer: >60 mL/min (ref >60)
GFR calc non Af Amer: >60 mL/min (ref >60)
Glucose, Bld: 98 mg/dL (ref 65–99)
Potassium: 3.5 mmol/L (ref 3.5–5.1)
Sodium: 138 mmol/L (ref 135–145)
Total Bilirubin: 0.7 mg/dL (ref 0.3–1.2)
Total Protein: 7.1 g/dL (ref 6.5–8.1)

## 2017-06-24 LAB — TSH: TSH: 3.088 u[IU]/mL (ref 0.350–4.500)

## 2017-06-24 MED ORDER — SODIUM CHLORIDE 0.9 % IV SOLN
200.0000 mg | Freq: Once | INTRAVENOUS | Status: AC
  Administered 2017-06-24: 200 mg via INTRAVENOUS
  Filled 2017-06-24: qty 8

## 2017-06-24 MED ORDER — NYSTATIN 100000 UNIT/ML MT SUSP: 5.0000 mL | Freq: Four times a day (QID) | OROMUCOSAL | 0 refills | Status: DC

## 2017-06-24 MED ORDER — HYDRALAZINE HCL 20 MG/ML IJ SOLN
10.0000 mg | Freq: Once | INTRAMUSCULAR | Status: AC
  Administered 2017-06-24: 10 mg via INTRAVENOUS
  Filled 2017-06-24: qty 0.5

## 2017-06-24 MED ORDER — SODIUM CHLORIDE 0.9% FLUSH
10.0000 mL | Freq: Once | INTRAVENOUS | Status: AC
  Administered 2017-06-24: 10 mL via INTRAVENOUS

## 2017-06-24 MED ORDER — SODIUM CHLORIDE 0.9 % IV SOLN: Freq: Once | INTRAVENOUS | Status: DC

## 2017-06-24 MED ORDER — HEPARIN SOD (PORK) LOCK FLUSH 100 UNIT/ML IV SOLN
500.0000 [IU] | Freq: Once | INTRAVENOUS | Status: AC | PRN
  Administered 2017-06-24: 500 [IU]

## 2017-06-24 MED ORDER — SODIUM CHLORIDE 0.9 % IV SOLN
INTRAVENOUS | Status: DC
  Administered 2017-06-24: 10:00:00 via INTRAVENOUS

## 2017-06-24 NOTE — Patient Instructions (Signed)
[facility] at [facility] Discharge Instructions  RECOMMENDATIONS MADE BY THE CONSULTANT AND ANY TEST RESULTS WILL BE SENT TO YOUR REFERRING PHYSICIAN.  You saw Rulon Abide, NP today.  See Amy or Danielle at front desk for appointments.  Thank you for choosing [facility] [facility] at [facility] to provide your oncology and hematology care.  To afford each patient quality time with our provider, please arrive at least 15 minutes before your scheduled appointment time.    If you have a lab appointment with the [facility] please come in thru the  Main Entrance and check in at the main information desk  You need to re-schedule your appointment should you arrive 10 or more minutes late.  We strive to give you quality time with our providers, and arriving late affects you and other patients whose appointments are after yours.  Also, if you no show three or more times for appointments you may be dismissed from the clinic at the providers discretion.     Again, thank you for choosing [facility]  Our hope is that these requests will decrease the amount of time that you wait before being seen by our physicians.       _____________________________________________________________  Should you have questions after your visit to [facility], please contact our office at [phone] between the hours of 8:30 a.m. and 4:30 p.m.  Voicemails left after 4:30 p.m. will not be returned until the following business day.  For prescription refill requests, have your pharmacy contact our office.       Resources For Cancer Patients and their Caregivers ? American Cancer Society: Can assist with transportation, wigs, general needs, runs Look Good Feel Better.        858-295-9603 ? Cancer Care: Provides financial assistance, online support groups, medication/co-pay assistance.  1-800-813-HOPE (276)414-4266) ? [place] Assists [place] cancer patients and their families through emotional , educational and financial support.  404-184-7696 ? [place] DSS Where to apply for food stamps, Medicaid and utility assistance. 307-843-3590 ? [facility]: Transportation to medical appointments. (516) 152-3821 ? Social Security Administration: May apply for disability if have a Stage IV cancer. 602 335 1752 631-166-2445 ? [employer], Disability and Transit Services: Assists with nutrition, care and transit needs. [place] Support Programs: @10RELATIVEDAYS @ > Cancer Support Group  2nd Tuesday of the month 1pm-2pm, Journey Room  > Creative Journey  3rd Tuesday of the month 1130am-1pm, Journey Room  > Look Good Feel Better  1st Wednesday of the month 10am-12 noon, Journey Room (Call [facility] to register [phone])

## 2017-06-24 NOTE — Patient Instructions (Signed)
[facility] Discharge Instructions for Patients Receiving Chemotherapy  Today you received the following chemotherapy agents keytruda today.    If you develop nausea and vomiting that is not controlled by your nausea medication, call the clinic.   BELOW ARE SYMPTOMS THAT SHOULD BE REPORTED IMMEDIATELY:  *FEVER GREATER THAN 100.5 F  *CHILLS WITH OR WITHOUT FEVER  NAUSEA AND VOMITING THAT IS NOT CONTROLLED WITH YOUR NAUSEA MEDICATION  *UNUSUAL SHORTNESS OF BREATH  *UNUSUAL BRUISING OR BLEEDING  TENDERNESS IN MOUTH AND THROAT WITH OR WITHOUT PRESENCE OF ULCERS  *URINARY PROBLEMS  *BOWEL PROBLEMS  UNUSUAL RASH Items with * indicate a potential emergency and should be followed up as soon as possible.  Feel free to call the clinic should you have any questions or concerns. The clinic phone number is [phone].  Please show the [facility] [place] at check-in to the Emergency Department and triage nurse.

## 2017-06-24 NOTE — Progress Notes (Signed)
[facility] Follow up:   Celene Squibb, MD [place] [address]   DIAGNOSIS: Cancer Staging Primary cancer of right upper lobe of lung [facility]) Staging form: Lung, AJCC 8th Edition - Clinical: Stage IVB (cT3, cN2, cM1c) - Signed by Baird Cancer, PA-C on 05/07/2016  Assessment and plan:  1.  Stage 4 adenocarcinoma of lung.   Patient is currently being treated with palliative therapy Keytruda. He is tolerating therapy without problems. Labs are adequate for treatment today. Patient is mildly hypertensive. He will require 10 mg IV hydralazine prior to treatment. We will proceed with treatment today if BP okay and have him follow up prior to next cycle. We will have the CT CAP prior to next therapy.   2.  Diarrhea.  He reports this is not significant. Patient states this happens on occasion. This does not happen after each chemotherapy treatment.  He had previously been placed on prednisone.  Unsure if patient completed a Medrol dose taper. Patient unable to verbalize current medication list. He states "I take what is prescribed for me".   3.  Neuropathy.  Reports this intermittently. Typically occurs while sitting resting with his arms and one position. When he moves his arms the neuropathy is relieved.  4.  Shortness of breath. He continues to have occasional shortness of breath with exertion. Oxygen saturation at rest or 97% on room air today.  5. Hypertension: BP elevated. He takes his BP medication at night before bed. States she took this medication last night. He will receive 10 mg IV hydralazine prior to treatment. BP on recheck adequate to treat.  6. Rash/actinic keratosis: Patient has several areas that are worrisome for actinic keratosis mainly on back. There are several areas on his abdomen as well. Recommend he see a dermatologist. Patient is concerned about co-pays and how he can afford  these visits. We will give him nystatin powder for groin  folds and under his arms.   7. Elevated white count: Patient's labs indicate a slight increase in white blood cell count today. 13.9. This could be residual from his illness last week. He was treated with a Z-Pak and Mucinex for congestion.  Interval history: 72 y.o. male with diagnosis of leiomyosarcoma of the right chest wall as well as a stage IV adenocarcinoma of the lung, currently undergoing palliative systemic immunotherapy with pembrolizumab.  He tells me he was recently treated at the urgent care for "the flu". He was given a Z-Pak which she finished last week as well as Mucinex for congestion. He had a cough that is now better.  Current Status: The patient is seen today for follow-up. He reports some occasional neuropathy and is relieved with movement. He complains of occasional diarrhea intermittently.  He reports a continued mild rash. Patient states he uses his cream as prescribed but is unable to reach his back for areas that are "itchy". Patient seen today  for follow-up.  He reports some occasional neuropathy and also occasional diarrhea but denies diarrhea significant.  He reports a mild rash and occasional shortness of breath on exertion.  He reports he had previously been placed on prednisone but not sure if he needs to continue taking this.    SUMMARY OF ONCOLOGIC HISTORY:   Primary cancer of right upper lobe of lung ([place])   05/14/2015 Procedure    Port placement by Dr. Arnoldo Morale      04/01/2016 Initial Diagnosis    Primary  cancer of right upper lobe of lung ([place])      04/02/2016 Procedure    Ultrasound-guided core biopsy performed of a solid 1.5 cm soft tissue nodule in the right chest wall.      04/03/2016 Imaging    MRI brain- No acute and intracranial process or metastasis.  Moderate chronic small vessel ischemic disease.      04/04/2016 Pathology Results    Diagnosis Soft Tissue Needle Core Biopsy, Right Chest Wall SMOOTH MUSCLE NEOPLASM Microscopic  Comment The differential diagnosis include low grade leiomyosarcoma. The neoplasm shows spindle cell morphology with rare mitotic figures and minimal atypia, no necrosis present. These cells stains positive for smooth muscle actin, desmin, negative for cytokeratin AE1&3, ck8/18, s100, cd117, cd 34 and cd99. This case also reviewed by Dr. [name] and agree.      04/05/2016 Procedure    CT-guided biopsy of right upper lobe lesion, with tissue specimen sent to pathology, by IR.      04/09/2016 Pathology Results    Lung, needle/core biopsy(ies), Right Upper Lobe - NON SMALL CELL CARCINOMA.      04/17/2016 Pathology Results    PDL1 High Expression.  TPS 50%.      04/24/2016 PET scan    1. Prominently hypermetabolic large right upper lobe mass with metastatic disease to the right paratracheal and right hilar nodal chains, to the descending mesocolon, to the left upper buttock subcutaneous tissues, to the left upper lobe, to the right subscapularis muscle, and possibly to the left adrenal gland and right breast subcutaneous tissues. Appearance compatible with stage IV lung cancer. 2. There is some focal high activity in the ascending colon which is probably from peristalsis, less likely from a local colon mass. This does raise the possibility that the adjacent mesocolon tumor implant could be due to synchronous colon cancer rather than lung metastasis. 3. Other imaging findings of potential clinical significance: Coronary, aortic arch, and branch vessel atherosclerotic vascular disease. Aortoiliac atherosclerotic vascular disease. Deformity left proximal humerus and left glenohumeral joint from prior trauma. Enlarged prostate gland.      04/26/2016 Pathology Results    Foundation[facility]: Genomic alterations identified- KRAS O27X, CREBBP splice site [id number], LRP1B S515*, VEH20 N47*, SJG28 splice site [id number], SPTA1 splice site [id number], TP53 C135Y.  Additional findings-  MS-Stable, TMB-intermediate (14 Muts/Mb).  No reportable alterations- EGFR, ALK, BRAF, MET, ERBB2, RET, ROS1.      05/17/2016 -  Chemotherapy    The patient had ONDANSETRON IVPB [facility] +/- DEXAMETHASONE, , Intravenous,  Once, 0 of 1 cycle  pembrolizumab (KEYTRUDA) 200 mg in sodium chloride 0.9 % 50 mL chemo infusion, 200 mg, Intravenous, Once, 0 of 5 cycles  for chemotherapy treatment.        08/06/2016 PET scan    IMPRESSION: 1. Overall considerable improvement. The right upper lobe mass is markedly reduced in volume and SUV. Thoracic adenopathy have resolved and the hypermetabolic lesion in the right subscapularis muscle has also resolved. 2. The left upper lobe nodule is no longer appreciably hypermetabolic, and is highly indistinct although this may be due to motion artifact. Faint in indistinct nodularity elsewhere in the lungs likewise not currently hypermetabolic. 3. There is some very faint residual low-grade activity along the subcutaneous deposit along the upper left buttock region. Marked reduction in size and activity of the tumor deposit adjacent to the descending colon. 4. Prior right pleural effusion has resolved. 5. Stable appearance of the adrenal glands, with low-level activity and a nodule in the left  adrenal gland. 6. Stable small nodule in the right breast, low-grade metabolic activity with maximum SUV 2.2 (formerly 3.0) 7. Other imaging findings of potential clinical significance: Severe arthropathy of the left glenohumeral joint. Coronary, aortic arch, and branch vessel atherosclerotic vascular disease. Aortoiliac atherosclerotic vascular disease. Prominent prostate gland indents the bladder base.        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  Two adjacent large masslike areas of consolidation within the right upper  lobe with differential considerations including malignancy or pneumonia. Multiple enlarged right hilar and mediastinal lymph nodes which may be reactive or metastatic in etiology.  Additional indeterminate pulmonary nodules as above. Recommend attention on follow-up.  Indeterminate 2.9 cm left adrenal nodule. This needs dedicated evaluation with pre and post contrast-enhanced CT or MRI after confirmation of pulmonary process.  Indeterminate nodule within the right breast. Recommend dedicated evaluation with mammography.  Hepatic steatosis.      04/02/2016 Procedure    Ultrasound-guided core biopsy performed of a solid 1.5 cm soft tissue nodule in the right chest wall.      04/04/2016 Pathology Results    Diagnosis Soft Tissue Needle Core Biopsy, Right Chest Wall SMOOTH MUSCLE NEOPLASM Microscopic Comment The differential diagnosis include low grade leiomyosarcoma. The neoplasm shows spindle cell morphology with rare mitotic figures and minimal atypia, no necrosis present. These cells stains positive for smooth muscle actin, desmin, negative for cytokeratin AE1&3, ck8/18, s100, cd117, cd 34 and cd99. This case also reviewed by Dr. [name] and agree.        CURRENT THERAPY: Keytruda  Patient Active Problem List   Diagnosis Date Noted  . Itching 10/10/2016  . Diabetes mellitus ([place]) 05/02/2016  . 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[place])   . Primary cancer of right upper lobe of lung ([place]) 04/01/2016  . Breast nodule 04/01/2016  . Adrenal mass, left ([place]) 04/01/2016  . HTN (hypertension), benign 04/01/2016  . Pulmonary embolus ([place]) 03/31/2016   NKDA  MEDICAL HISTORY: Past Medical History:  Diagnosis Date  . Adrenal mass, left ([place]) 04/01/2016  . Diabetes mellitus ([place]) 05/02/2016  . Diabetes mellitus without complication ([place])   . Hypertension   . Mass of breast, right 04/01/2016  . Mass of upper lobe of right lung 04/01/2016   2 adjacent, 5 cm masses, hilar adenopathy   . Primary cancer of right upper lobe of lung ([place]) 04/01/2016   2 adjacent, 5 cm masses, hilar adenopathy  . Pulmonary embolus ([place]) 03/31/2016  . 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[place])     SURGICAL HISTORY: Past Surgical History:  Procedure Laterality Date  . PORTACATH PLACEMENT Left 05/13/2016   Procedure: INSERTION PORT-A-CATH;  Surgeon: Aviva Signs, MD;  Location: [facility];  Service: General;  Laterality: Left;    SOCIAL HISTORY: Social History   Socioeconomic History  . Marital status: Divorced    Spouse name: Not on file  . Number of children: Not on file  . Years of education: Not on file  . Highest education level: Not on file  Social Needs  . Financial resource strain: Not on file  . Food insecurity - worry: Not on file  . Food insecurity - inability: Not on file  . Transportation needs - medical: Not on file  . Transportation needs - non-medical: Not on file  Occupational History  . Not on file  Tobacco Use  . Smoking status:  Current Every Day Smoker    Years: 61.00    Types: E-cigarettes    Last attempt to quit: 04/11/2014    Years since quitting: 3.2  . Smokeless tobacco: Never Used  . Tobacco comment: patient does vape smoking x 2 years  Substance and Sexual Activity  . Alcohol use: No    Comment: quit 10 years  . Drug use: No  . Sexual activity: Not on file  Other Topics Concern  . Not on file  Social History Narrative  . Not on file    FAMILY HISTORY:  No family history of malignancy on file.    Prior to Admission medications   Medication Sig Start Date End Date Taking? Authorizing Provider  ALPRAZolam [name]) 1 MG tablet Take 1 mg by mouth 3 (three) times daily as needed for anxiety.   Yes [provider]  amLODipine (NORVASC) 2.5 MG tablet Take 2.5 mg by mouth daily. 03/22/16  Yes [provider]  azithromycin (ZITHROMAX) 250 MG tablet  06/16/17  Yes [provider]  diphenoxylate-atropine (LOMOTIL) 2.5-0.025 MG tablet Take 1  tablet 4 (four) times daily as needed by mouth for diarrhea or loose stools. 03/11/17  Yes Twana First, MD  furosemide (LASIX) 20 MG tablet Take 2 tablets (40 mg total) by mouth daily. 11/25/16  Yes Kefalas, Manon Hilding, PA-C  GLIPIZIDE XL 5 MG 24 hr tablet Take 5 mg by mouth daily. 03/22/16  Yes [provider]  hydrocortisone cream 0.5 % Apply 1 application topically 2 (two) times daily. 05/24/16  Yes Baird Cancer, PA-C  hydrOXYzine (ATARAX/VISTARIL) 25 MG tablet Take 1 tablet (25 mg total) by mouth 3 (three) times daily as needed. 06/18/17  Yes Holley Bouche, NP  hydrOXYzine (VISTARIL) 25 MG capsule  06/16/17  Yes [provider]  lidocaine-prilocaine (EMLA) cream Apply a quarter size amount to affected area 1 hour prior to coming to chemotherapy. Cover with plastic wrap. 05/13/17  Yes Ardath Sax, MD  metFORMIN (GLUCOPHAGE) 500 MG tablet Take 500 mg by mouth 2 (two) times daily. 03/07/16  Yes [provider]  methylPREDNISolone (MEDROL DOSEPAK) 4 MG TBPK tablet Take as pharmacy directed. 06/03/17  Yes Higgs, Mathis Dad, MD  metoprolol succinate (TOPROL-XL) 25 MG 24 hr tablet TAKE ONE TABLET BY MOUTH IN THE EVENING. 03/26/17  Yes Holley Bouche, NP  naproxen (NAPROSYN) 500 MG tablet Take 1 tablet (500 mg total) by mouth 2 (two) times daily with a meal. 09/23/16  Yes Kefalas, Manon Hilding, PA-C  ondansetron (ZOFRAN) 8 MG tablet Take 8 mg by mouth every 8 (eight) hours as needed for nausea or vomiting.   Yes [provider]  oxyCODONE-acetaminophen (PERCOCET) 10-325 MG tablet  06/18/17  Yes [provider]  Pembrolizumab (KEYTRUDA IV) Inject into the vein.   Yes [provider]  predniSONE (DELTASONE) 20 MG tablet Take at the onset of diarrhea.  Take 5 tablets (100 mg) at one time.  Then call the [facility]. 01/28/17  Yes Twana First, MD  prochlorperazine (COMPAZINE) 10 MG tablet  05/08/16  Yes [provider]  simvastatin (ZOCOR) 20 MG  tablet Take 20 mg by mouth every evening. 03/07/16  Yes [provider]  Skin Protectants, Misc. (EUCERIN) cream Apply topically as needed for dry skin. 08/09/16  Yes Choksi, Delorise Shiner, MD  spironolactone (ALDACTONE) 50 MG tablet Take 1 tablet (50 mg total) by mouth daily. 11/25/16  Yes Kefalas, Manon Hilding, PA-C  nystatin (MYCOSTATIN) 100000 UNIT/ML suspension Take  5 mLs (500,000 Units total) by mouth 4 (four) times daily. 06/24/17   Jacquelin Hawking, NP    Review of Systems  Constitutional: Positive for fatigue. Negative for appetite change, chills, fever and unexpected weight change.  HENT:  Negative.   Eyes: Negative.   Respiratory: Negative.   Cardiovascular: Negative.   Gastrointestinal: Positive for abdominal distention, abdominal pain (Chronic) and diarrhea. Negative for constipation, nausea and vomiting.  Endocrine: Negative.   Genitourinary: Negative.    Musculoskeletal: Positive for back pain.  Skin: Positive for rash (Several spots located on abdomen and back. White crusty lesions. Warrants a dermatology workup.).   Negative other than SOB, Minor diarrhea, occasional neuropathy and mild rash and itching.     PHYSICAL EXAMINATION   ECOG PERFORMANCE STATUS: 1  Vitals:   06/24/17 0837  BP: (!) 178/90  Pulse: 81  Resp: 18  Temp: 97.6 F (36.4 C)  SpO2: 97%    Physical Exam  Constitutional: He is oriented to person, place, and time and well-developed, well-nourished, and in no distress.  HENT:  Head: Normocephalic and atraumatic.  Eyes: EOM are normal. Pupils are equal, round, and reactive to light.  Neck: Neck supple.  Cardiovascular: Normal rate and regular rhythm.  Hypertensive  Pulmonary/Chest: Effort normal and breath sounds normal.  Abdominal: Soft. Normal appearance and bowel sounds are normal.  Musculoskeletal: Normal range of motion.  Neurological: He is alert and oriented to person, place, and time.  Skin: Skin is warm and dry.  Psychiatric: Affect and  judgment normal. He exhibits a depressed mood.    LABORATORY DATA:  CBC    Component Value Date/Time   WBC 13.9 (H) 06/24/2017 0819   RBC 5.38 06/24/2017 0819   HGB 15.2 06/24/2017 0819   HGB 13.5 05/02/2016 0852   HCT 46.8 06/24/2017 0819   HCT 41.0 05/02/2016 0852   PLT 272 06/24/2017 0819   PLT 436 (H) 05/02/2016 0852   MCV 87.0 06/24/2017 0819   MCV 85.7 05/02/2016 0852   MCH 28.3 06/24/2017 0819   MCHC 32.5 06/24/2017 0819   RDW 14.1 06/24/2017 0819   RDW 13.9 05/02/2016 0852   LYMPHSABS 6.1 (H) 06/24/2017 0819   LYMPHSABS 3.4 (H) 05/02/2016 0852   MONOABS 1.0 06/24/2017 0819   MONOABS 1.2 (H) 05/02/2016 0852   EOSABS 0.4 06/24/2017 0819   EOSABS 1.6 (H) 05/02/2016 0852   BASOSABS 0.1 06/24/2017 0819   BASOSABS 0.2 (H) 05/02/2016 0852    CMP     Component Value Date/Time   NA 138 06/24/2017 0819   NA 139 05/02/2016 0852   K 3.5 06/24/2017 0819   K 4.2 05/02/2016 0852   CL 102 06/24/2017 0819   CO2 26 06/24/2017 0819   CO2 24 05/02/2016 0852   GLUCOSE 98 06/24/2017 0819   GLUCOSE 103 05/02/2016 0852   BUN 18 06/24/2017 0819   BUN 10.6 05/02/2016 0852   CREATININE 1.10 06/24/2017 0819   CREATININE 1.0 05/02/2016 0852   CALCIUM 8.9 06/24/2017 0819   CALCIUM 9.2 05/02/2016 0852   PROT 7.1 06/24/2017 0819   PROT 7.5 05/02/2016 0852   ALBUMIN 4.0 06/24/2017 0819   ALBUMIN 3.1 (L) 05/02/2016 0852   AST 18 06/24/2017 0819   AST 12 05/02/2016 0852   ALT 16 (L) 06/24/2017 0819   ALT 14 05/02/2016 0852   ALKPHOS 83 06/24/2017 0819   ALKPHOS 73 05/02/2016 0852   BILITOT 0.7 06/24/2017 0819   BILITOT 0.42 05/02/2016 0852   GFRNONAA >60  06/24/2017 0819   GFRAA >60 06/24/2017 0819    ASSESSMENT and THERAPY PLAN: As above  Orders Placed This Encounter  Procedures  . CT Chest W Contrast    Standing Status:   Future    Standing Expiration Date:   06/24/2018    Order Specific Question:   If indicated for the ordered procedure, I authorize the administration  of contrast media per Radiology protocol    Answer:   Yes    Order Specific Question:   Preferred imaging location?    Answer:   [facility]    Order Specific Question:   Radiology Contrast Protocol - do NOT remove file path    Answer:   \\[email or web address]  . CT Abdomen Pelvis W Contrast    Standing Status:   Future    Standing Expiration Date:   06/24/2018    Order Specific Question:   If indicated for the ordered procedure, I authorize the administration of contrast media per Radiology protocol    Answer:   Yes    Order Specific Question:   Preferred imaging location?    Answer:   [facility]    Order Specific Question:   Radiology Contrast Protocol - do NOT remove file path    Answer:   \\[email or web address]    All questions were answered. The patient knows to call the clinic with any problems, questions or concerns. We can certainly see the patient much sooner if necessary. This note was electronically signed. Jacquelin Hawking, NP 06/24/2017

## 2017-06-24 NOTE — Progress Notes (Signed)
To room for oncology follow up visit and treatment.  Patient cannot read making it difficult to review medications he is taking at home.  Stated he usually has diarrhea prn after treatment day but none over the past couple of weeks.  Numbness in fingers but able to perform ADL's.  He can still manage small buttons, zippers, and jar lids.  Complaints of rash with itching on abdomen that is not new and does not use any of his medications for management.    Patient tolerated treatment with no complaints voiced.  Port site clean and dry with no bruising or swelling noted at site.  No complaints of pain with flush.  Band aid applied.  VSS with discharge and left ambulatory with no s/s of distress noted.

## 2017-07-09 ENCOUNTER — Ambulatory Visit (HOSPITAL_COMMUNITY)
Admission: RE | Admit: 2017-07-09 | Discharge: 2017-07-09 | Disposition: A | Discharge status: Home / Self Care | Payer: Medicare Other | Source: Ambulatory Visit | Attending: Oncology | Admitting: Oncology

## 2017-07-09 DIAGNOSIS — R918 Other nonspecific abnormal finding of lung field: Secondary | ICD-10-CM | POA: Diagnosis not present

## 2017-07-09 DIAGNOSIS — C3411 Malignant neoplasm of upper lobe, right bronchus or lung: Secondary | ICD-10-CM | POA: Diagnosis not present

## 2017-07-09 DIAGNOSIS — K6389 Other specified diseases of intestine: Secondary | ICD-10-CM | POA: Diagnosis not present

## 2017-07-09 DIAGNOSIS — Z85118 Personal history of other malignant neoplasm of bronchus and lung: Secondary | ICD-10-CM | POA: Diagnosis not present

## 2017-07-09 DIAGNOSIS — E279 Disorder of adrenal gland, unspecified: Secondary | ICD-10-CM | POA: Insufficient documentation

## 2017-07-09 MED ORDER — IOPAMIDOL (ISOVUE-300) INJECTION 61%
100.0000 mL | Freq: Once | INTRAVENOUS | Status: AC | PRN
  Administered 2017-07-09: 100 mL via INTRAVENOUS

## 2017-07-15 ENCOUNTER — Ambulatory Visit (HOSPITAL_COMMUNITY): Payer: Medicare Other | Admitting: Hematology

## 2017-07-15 ENCOUNTER — Inpatient Hospital Stay (HOSPITAL_COMMUNITY): Payer: Medicare Other | Attending: Adult Health

## 2017-07-15 DIAGNOSIS — R197 Diarrhea, unspecified: Secondary | ICD-10-CM | POA: Diagnosis not present

## 2017-07-15 DIAGNOSIS — Z7984 Long term (current) use of oral hypoglycemic drugs: Secondary | ICD-10-CM | POA: Insufficient documentation

## 2017-07-15 DIAGNOSIS — Z86711 Personal history of pulmonary embolism: Secondary | ICD-10-CM | POA: Insufficient documentation

## 2017-07-15 DIAGNOSIS — C3411 Malignant neoplasm of upper lobe, right bronchus or lung: Secondary | ICD-10-CM | POA: Diagnosis not present

## 2017-07-15 DIAGNOSIS — I10 Essential (primary) hypertension: Secondary | ICD-10-CM | POA: Insufficient documentation

## 2017-07-15 DIAGNOSIS — F17210 Nicotine dependence, cigarettes, uncomplicated: Secondary | ICD-10-CM | POA: Insufficient documentation

## 2017-07-15 DIAGNOSIS — K760 Fatty (change of) liver, not elsewhere classified: Secondary | ICD-10-CM | POA: Insufficient documentation

## 2017-07-15 DIAGNOSIS — E279 Disorder of adrenal gland, unspecified: Secondary | ICD-10-CM | POA: Diagnosis not present

## 2017-07-15 DIAGNOSIS — Z5111 Encounter for antineoplastic chemotherapy: Secondary | ICD-10-CM | POA: Insufficient documentation

## 2017-07-15 DIAGNOSIS — E119 Type 2 diabetes mellitus without complications: Secondary | ICD-10-CM | POA: Diagnosis not present

## 2017-07-15 DIAGNOSIS — Z79899 Other long term (current) drug therapy: Secondary | ICD-10-CM | POA: Diagnosis not present

## 2017-07-15 LAB — CBC WITH DIFFERENTIAL/PLATELET
Basophils Absolute: 0.1 10*3/uL (ref 0.0–0.1)
Basophils Relative: 1 %
Eosinophils Absolute: 0.7 10*3/uL (ref 0.0–0.7)
Eosinophils Relative: 7 %
HCT: 44.3 % (ref 39.0–52.0)
Hemoglobin: 14.1 g/dL (ref 13.0–17.0)
Lymphocytes Relative: 41 %
Lymphs Abs: 3.8 10*3/uL (ref 0.7–4.0)
MCH: 27.5 pg (ref 26.0–34.0)
MCHC: 31.8 g/dL (ref 30.0–36.0)
MCV: 86.5 fL (ref 78.0–100.0)
Monocytes Absolute: 0.8 10*3/uL (ref 0.1–1.0)
Monocytes Relative: 8 %
Neutro Abs: 4 10*3/uL (ref 1.7–7.7)
Neutrophils Relative %: 43 %
Platelets: 232 10*3/uL (ref 150–400)
RBC: 5.12 MIL/uL (ref 4.22–5.81)
RDW: 14.1 % (ref 11.5–15.5)
WBC: 9.3 10*3/uL (ref 4.0–10.5)

## 2017-07-15 LAB — COMPREHENSIVE METABOLIC PANEL
ALT: 23 U/L (ref 17–63)
AST: 22 U/L (ref 15–41)
Albumin: 3.9 g/dL (ref 3.5–5.0)
Alkaline Phosphatase: 89 U/L (ref 38–126)
Anion gap: 12 (ref 5–15)
BUN: 10 mg/dL (ref 6–20)
CO2: 24 mmol/L (ref 22–32)
Calcium: 9 mg/dL (ref 8.9–10.3)
Chloride: 104 mmol/L (ref 101–111)
Creatinine, Ser: 1 mg/dL (ref 0.61–1.24)
GFR calc Af Amer: >60 mL/min (ref >60)
GFR calc non Af Amer: >60 mL/min (ref >60)
Glucose, Bld: 133 mg/dL — ABNORMAL HIGH (ref 65–99)
Potassium: 3.7 mmol/L (ref 3.5–5.1)
Sodium: 140 mmol/L (ref 135–145)
Total Bilirubin: 0.4 mg/dL (ref 0.3–1.2)
Total Protein: 6.8 g/dL (ref 6.5–8.1)

## 2017-07-15 LAB — TSH: TSH: 2.707 u[IU]/mL (ref 0.350–4.500)

## 2017-07-15 LAB — LACTATE DEHYDROGENASE: LDH: 156 U/L (ref 98–192)

## 2017-07-15 NOTE — Progress Notes (Signed)
Pt rescheduled for Thursday, 07/17/17, to include office visit (to review most recent CT scan results), and to receive possible tx.

## 2017-07-17 ENCOUNTER — Inpatient Hospital Stay (HOSPITAL_COMMUNITY): Payer: Medicare Other | Admitting: Internal Medicine

## 2017-07-17 ENCOUNTER — Encounter (HOSPITAL_COMMUNITY): Payer: Self-pay | Admitting: Internal Medicine

## 2017-07-17 ENCOUNTER — Other Ambulatory Visit: Payer: Self-pay

## 2017-07-17 ENCOUNTER — Inpatient Hospital Stay (HOSPITAL_COMMUNITY): Payer: Medicare Other

## 2017-07-17 VITALS — BP 157/84 | HR 82 | Temp 97.6°F | Resp 18

## 2017-07-17 VITALS — BP 165/91 | HR 78 | Resp 22 | Wt 236.0 lb

## 2017-07-17 DIAGNOSIS — I10 Essential (primary) hypertension: Secondary | ICD-10-CM

## 2017-07-17 DIAGNOSIS — R197 Diarrhea, unspecified: Secondary | ICD-10-CM

## 2017-07-17 DIAGNOSIS — F17210 Nicotine dependence, cigarettes, uncomplicated: Secondary | ICD-10-CM

## 2017-07-17 DIAGNOSIS — C3411 Malignant neoplasm of upper lobe, right bronchus or lung: Secondary | ICD-10-CM | POA: Diagnosis not present

## 2017-07-17 DIAGNOSIS — Z5111 Encounter for antineoplastic chemotherapy: Secondary | ICD-10-CM | POA: Diagnosis not present

## 2017-07-17 DIAGNOSIS — Z79899 Other long term (current) drug therapy: Secondary | ICD-10-CM | POA: Diagnosis not present

## 2017-07-17 MED ORDER — SODIUM CHLORIDE 0.9 % IV SOLN
Freq: Once | INTRAVENOUS | Status: AC
  Administered 2017-07-17: 11:00:00 via INTRAVENOUS

## 2017-07-17 MED ORDER — HEPARIN SOD (PORK) LOCK FLUSH 100 UNIT/ML IV SOLN
500.0000 [IU] | Freq: Once | INTRAVENOUS | Status: AC | PRN
  Administered 2017-07-17: 500 [IU]

## 2017-07-17 MED ORDER — PEMBROLIZUMAB CHEMO INJECTION 100 MG/4ML
200.0000 mg | Freq: Once | INTRAVENOUS | Status: AC
  Administered 2017-07-17: 200 mg via INTRAVENOUS
  Filled 2017-07-17: qty 8

## 2017-07-17 NOTE — Patient Instructions (Signed)
[place] [facility] Discharge Instructions   You were seen today by Dr. Zoila Shutter. She discussed your recent scan with you and it's findings. She would like to refer you to GI for a colonoscopy to check the area that shows up on the scan. You will get your treatment today. We will see you for follow up in 1 month.    Thank you for choosing [place] at [facility] to provide your oncology and hematology care.  To afford each patient quality time with our provider, please arrive at least 15 minutes before your scheduled appointment time.    If you have a lab appointment with the [facility] please come in thru the  Main Entrance and check in at the main information desk  You need to re-schedule your appointment should you arrive 10 or more minutes late.  We strive to give you quality time with our providers, and arriving late affects you and other patients whose appointments are after yours.  Also, if you no show three or more times for appointments you may be dismissed from the clinic at the providers discretion.     Again, thank you for choosing [facility].  Our hope is that these requests will decrease the amount of time that you wait before being seen by our physicians.       _____________________________________________________________  Should you have questions after your visit to [facility], please contact our office at [phone] between the hours of 8:30 a.m. and 4:30 p.m.  Voicemails left after 4:30 p.m. will not be returned until the following business day.  For prescription refill requests, have your pharmacy contact our office.       Resources For Cancer Patients and their Caregivers ? American Cancer Society: Can assist with transportation, wigs, general needs, runs Look Good Feel Better.        (215)088-8123 ? Cancer Care: Provides financial assistance, online support groups,  medication/co-pay assistance.  1-800-813-HOPE 918-466-1987) ? [employer] Assists [place] cancer patients and their families through emotional , educational and financial support.  512 321 9385 ? [place] DSS Where to apply for food stamps, Medicaid and utility assistance. 231-241-4764 ? [facility]: Transportation to medical appointments. (731)770-7342 ? Social Security Administration: May apply for disability if have a Stage IV cancer. (747)052-6689 4402561275 ? [employer], Disability and Transit Services: Assists with nutrition, care and transit needs. [place] Support Programs:   > Cancer Support Group  2nd Tuesday of the month 1pm-2pm, Journey Room   > Creative Journey  3rd Tuesday of the month 1130am-1pm, Journey Room

## 2017-07-21 ENCOUNTER — Telehealth (HOSPITAL_COMMUNITY): Payer: Self-pay | Admitting: *Deleted

## 2017-07-21 NOTE — Progress Notes (Signed)
Diagnosis No diagnosis found.  Staging Cancer Staging Primary cancer of right upper lobe of lung ([place]) Staging form: Lung, AJCC 8th Edition - Clinical: Stage IVB (cT3, cN2, cM1c) - Signed by Baird Cancer, PA-C on 05/07/2016   Assessment and Plan:  1.  Stage 4 adenocarcinoma of lung.  Patient is currently being treated with palliative therapy with pembrolizumab.  He is tolerating therapy without significant problems.  He is here today to go over CT chest,abdomen and pelvis that was done on 07/09/2017 that showed:     Impression:  1. Interval mild decrease in volume of mass in the superior segment of the RIGHT lower lobe. 2. No mediastinal lymphadenopathy. 3. No progressive disease in the thorax.  Abdomen / Pelvis Impression:  1. Two new nodules along the serosal surface of the proximal sigmoid colon (mesenteric border) concerning for serosal / mesenteric metastatic implants. Isolated diverticulitis would be a secondary consideration however there are no diverticula in this region on comparison CT. 2. Stable thickening of the adrenal glands. 3. Resolution of small nodule along the mesenteric border of the descending colon.   I have discussed with the patient lung findings are stable to improved.  He however is showing evidence of questionable areas in the proximal sigmoid colon.  When questioned he has not undergone GI evaluation.  He is referred to Dr. Laural Golden for evaluation.  Labs are adequate for treatment and he will proceed with therapy today.  He will follow-up in 1 month for repeat evaluation.  2.  Diarrhea.  I have discussed with him results of scan which shows questionable area in the proximal sigmoid colon.  He has not undergone GI evaluation and will be seen by GI for further evaluation.    3.  Proximal sigmoid colon nodules.  This was noted on CT scan done 07/09/2017.  He is referred to Dr. Laural Golden for evaluation.  4.  Hypertension.  BP is 157/84.  Continue to follow-up  with PCP.     Interval history: 72 y.o. male with diagnosis of leiomyosarcoma of the right chest wall as well as a stage IV adenocarcinoma of the lung, currently undergoing palliative systemic immunotherapy with pembrolizumab.    Current Status: Patient seen today  for follow-up.  He is here to go over CT scans.  When questioned, he has never undergone colonoscopy.      Primary cancer of right upper lobe of lung ([place])   05/14/2015 Procedure    Port placement by Dr. Arnoldo Morale      04/01/2016 Initial Diagnosis    Primary cancer of right upper lobe of lung ([place])      04/02/2016 Procedure    Ultrasound-guided core biopsy performed of a solid 1.5 cm soft tissue nodule in the right chest wall.      04/03/2016 Imaging    MRI brain- No acute and intracranial process or metastasis.  Moderate chronic small vessel ischemic disease.      04/04/2016 Pathology Results    Diagnosis Soft Tissue Needle Core Biopsy, Right Chest Wall SMOOTH MUSCLE NEOPLASM Microscopic Comment The differential diagnosis include low grade leiomyosarcoma. The neoplasm shows spindle cell morphology with rare mitotic figures and minimal atypia, no necrosis present. These cells stains positive for smooth muscle actin, desmin, negative for cytokeratin AE1&3, ck8/18, s100, cd117, cd 34 and cd99. This case also reviewed by Dr. [name] and agree.      04/05/2016 Procedure    CT-guided biopsy of right upper lobe lesion, with tissue specimen sent to  pathology, by IR.      04/09/2016 Pathology Results    Lung, needle/core biopsy(ies), Right Upper Lobe - NON SMALL CELL CARCINOMA.      04/17/2016 Pathology Results    PDL1 High Expression.  TPS 50%.      04/24/2016 PET scan    1. Prominently hypermetabolic large right upper lobe mass with metastatic disease to the right paratracheal and right hilar nodal chains, to the descending mesocolon, to the left upper buttock subcutaneous tissues, to the left upper lobe, to  the right subscapularis muscle, and possibly to the left adrenal gland and right breast subcutaneous tissues. Appearance compatible with stage IV lung cancer. 2. There is some focal high activity in the ascending colon which is probably from peristalsis, less likely from a local colon mass. This does raise the possibility that the adjacent mesocolon tumor implant could be due to synchronous colon cancer rather than lung metastasis. 3. Other imaging findings of potential clinical significance: Coronary, aortic arch, and branch vessel atherosclerotic vascular disease. Aortoiliac atherosclerotic vascular disease. Deformity left proximal humerus and left glenohumeral joint from prior trauma. Enlarged prostate gland.      04/26/2016 Pathology Results    FoundationONE: Genomic alterations identified- KRAS C12X, CREBBP splice site [id number], LRP1B S515*, CBS49 Q75*, FFM38 splice site [id number], SPTA1 splice site [id number], TP53 C135Y.  Additional findings- MS-Stable, TMB-intermediate (14 Muts/Mb).  No reportable alterations- EGFR, ALK, BRAF, MET, ERBB2, RET, ROS1.      05/17/2016 -  Chemotherapy    The patient had ONDANSETRON IVPB [facility] +/- DEXAMETHASONE, , Intravenous,  Once, 0 of 1 cycle  pembrolizumab (KEYTRUDA) 200 mg in sodium chloride 0.9 % 50 mL chemo infusion, 200 mg, Intravenous, Once, 0 of 5 cycles  for chemotherapy treatment.        08/06/2016 PET scan    IMPRESSION: 1. Overall considerable improvement. The right upper lobe mass is markedly reduced in volume and SUV. Thoracic adenopathy have resolved and the hypermetabolic lesion in the right subscapularis muscle has also resolved. 2. The left upper lobe nodule is no longer appreciably hypermetabolic, and is highly indistinct although this may be due to motion artifact. Faint in indistinct nodularity elsewhere in the lungs likewise not currently hypermetabolic. 3. There is some very faint residual low-grade activity along  the subcutaneous deposit along the upper left buttock region. Marked reduction in size and activity of the tumor deposit adjacent to the descending colon. 4. Prior right pleural effusion has resolved. 5. Stable appearance of the adrenal glands, with low-level activity and a nodule in the left adrenal gland. 6. Stable small nodule in the right breast, low-grade metabolic activity with maximum SUV 2.2 (formerly 3.0) 7. Other imaging findings of potential clinical significance: Severe arthropathy of the left glenohumeral joint. Coronary, aortic arch, and branch vessel atherosclerotic vascular disease. Aortoiliac atherosclerotic vascular disease. Prominent prostate gland indents the bladder base.        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  Two adjacent large masslike areas of consolidation within the right upper lobe with differential considerations including malignancy or pneumonia. Multiple enlarged right hilar and mediastinal lymph nodes which may be reactive or metastatic in etiology.  Additional indeterminate pulmonary nodules as above. Recommend attention on follow-up.  Indeterminate 2.9 cm left adrenal nodule. This needs dedicated evaluation with pre and post contrast-enhanced CT or MRI after confirmation of pulmonary  process.  Indeterminate nodule within the right breast. Recommend dedicated evaluation with mammography.  Hepatic steatosis.      04/02/2016 Procedure    Ultrasound-guided core biopsy performed of a solid 1.5 cm soft tissue nodule in the right chest wall.      04/04/2016 Pathology Results    Diagnosis Soft Tissue Needle Core Biopsy, Right Chest Wall SMOOTH MUSCLE NEOPLASM Microscopic Comment The differential diagnosis include low grade leiomyosarcoma. The neoplasm shows spindle cell morphology with rare  mitotic figures and minimal atypia, no necrosis present. These cells stains positive for smooth muscle actin, desmin, negative for cytokeratin AE1&3, ck8/18, s100, cd117, cd 34 and cd99. This case also reviewed by Dr. [name] and agree.        Problem List Patient Active Problem List   Diagnosis Date Noted  . Itching [L29.9] 10/10/2016  . Diabetes mellitus ([place]) [E11.9] 05/02/2016  . 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 Primary cancer of right upper lobe of lung ([place]) [C34.11] 04/01/2016  . Breast nodule [N63.0] 04/01/2016  . Adrenal mass, left ([place]) [E27.9] 04/01/2016  . HTN (hypertension), benign [I10] 04/01/2016  . Pulmonary embolus ([place]) [I26.99] 03/31/2016    Past Medical History Past Medical History:  Diagnosis Date  . Adrenal mass, left ([place]) 04/01/2016  . Diabetes mellitus ([place]) 05/02/2016  . Diabetes mellitus without complication ([place])   . Hypertension   . Mass of breast, right 04/01/2016  . Mass of upper lobe of right lung 04/01/2016   2 adjacent, 5 cm masses, hilar adenopathy  . Primary cancer of right upper lobe of lung ([place]) 04/01/2016   2 adjacent, 5 cm masses, hilar adenopathy  . Pulmonary embolus ([place]) 03/31/2016  . Spindle cell sarcoma [facility])     Past Surgical History Past Surgical History:  Procedure Laterality Date  . PORTACATH PLACEMENT Left 05/13/2016   Procedure: INSERTION PORT-A-CATH;  Surgeon: Aviva Signs, MD;  Location: [facility];  Service: General;  Laterality: Left;    Family History History reviewed. No pertinent family history.   Social History  reports that he has been smoking e-cigarettes.  He has smoked for the past 61.00 years. he has never used smokeless tobacco. He reports that he does not drink alcohol or use drugs.  Medications  Current Outpatient Medications:  .  ALPRAZolam (XANAX) 1 MG tablet, Take 1 mg by mouth 3 (three) times daily as needed for anxiety., Disp: , Rfl:  .  amLODipine (NORVASC) 2.5 MG tablet, Take 2.5 mg by  mouth daily., Disp: , Rfl: 4 .  diphenoxylate-atropine (LOMOTIL) 2.5-0.025 MG tablet, Take 1 tablet 4 (four) times daily as needed by mouth for diarrhea or loose stools., Disp: 60 tablet, Rfl: 0 .  furosemide (LASIX) 20 MG tablet, Take 2 tablets (40 mg total) by mouth daily., Disp: 20 tablet, Rfl: 0 .  GLIPIZIDE XL 5 MG 24 hr tablet, Take 5 mg by mouth daily., Disp: , Rfl: 4 .  hydrocortisone cream 0.5 %, Apply 1 application topically 2 (two) times daily., Disp: 30 g, Rfl: 0 .  hydrOXYzine (ATARAX/VISTARIL) 25 MG tablet, Take 1 tablet (25 mg total) by mouth 3 (three) times daily as needed., Disp: 45 tablet, Rfl: 3 .  hydrOXYzine (VISTARIL) 25 MG capsule, , Disp: , Rfl: 1 .  lidocaine-prilocaine (EMLA) cream, Apply a quarter size amount to affected area 1 hour prior to coming to chemotherapy. Cover with plastic wrap., Disp: 30 g, Rfl: 2 .  metFORMIN (GLUCOPHAGE) 500 MG tablet, Take 500 mg by mouth  2 (two) times daily., Disp: , Rfl: 4 .  metoprolol succinate (TOPROL-XL) 25 MG 24 hr tablet, TAKE ONE TABLET BY MOUTH IN THE EVENING., Disp: 30 tablet, Rfl: 0 .  naproxen (NAPROSYN) 500 MG tablet, Take 1 tablet (500 mg total) by mouth 2 (two) times daily with a meal., Disp: 30 tablet, Rfl: 1 .  nystatin (MYCOSTATIN) 100000 UNIT/ML suspension, Take 5 mLs (500,000 Units total) by mouth 4 (four) times daily., Disp: 60 mL, Rfl: 0 .  ondansetron (ZOFRAN) 8 MG tablet, Take 8 mg by mouth every 8 (eight) hours as needed for nausea or vomiting., Disp: , Rfl:  .  oxyCODONE-acetaminophen (PERCOCET) 10-325 MG tablet, , Disp: , Rfl: 0 .  Pembrolizumab (KEYTRUDA IV), Inject into the vein., Disp: , Rfl:  .  predniSONE (DELTASONE) 20 MG tablet, Take at the onset of diarrhea.  Take 5 tablets (100 mg) at one time.  Then call the [place]., Disp: 30 tablet, Rfl: 0 .  prochlorperazine (COMPAZINE) 10 MG tablet, , Disp: , Rfl: 1 .  simvastatin (ZOCOR) 20 MG tablet, Take 20 mg by mouth every evening., Disp: , Rfl: 5 .  Skin  Protectants, Misc. (EUCERIN) cream, Apply topically as needed for dry skin., Disp: 454 g, Rfl: 1 .  spironolactone (ALDACTONE) 50 MG tablet, Take 1 tablet (50 mg total) by mouth daily., Disp: 10 tablet, Rfl: 0  Allergies Patient has no known allergies.  Review of Systems Review of Systems - Oncology ROS as per HPI otherwise 12 point ROS is negative.   Physical Exam  Vitals Wt Readings from Last 3 Encounters:  07/17/17 236 lb (107 kg)  07/15/17 231 lb (104.8 kg)  06/24/17 226 lb (102.5 kg)   Temp Readings from Last 3 Encounters:  07/17/17 97.6 F (36.4 C) (Oral)  07/15/17 97.6 F (36.4 C) (Oral)  06/24/17 97.6 F (36.4 C) (Oral)   BP Readings from Last 3 Encounters:  07/17/17 (!) 157/84  07/17/17 (!) 165/91  07/15/17 (!) 167/88   Pulse Readings from Last 3 Encounters:  07/17/17 82  07/17/17 78  07/15/17 68   Constitutional: Well-developed, well-nourished, and in no distress.   HENT: Head: Normocephalic and atraumatic.  Mouth/Throat: No oropharyngeal exudate. Mucosa moist. Eyes: Pupils are equal, round, and reactive to light. Conjunctivae are normal. No scleral icterus.  Neck: Normal range of motion. Neck supple. No JVD present.  Cardiovascular: Normal rate, regular rhythm and normal heart sounds.  Exam reveals no gallop and no friction rub.   No murmur heard. Pulmonary/Chest: Effort normal and breath sounds normal. No respiratory distress. No wheezes.No rales.  Abdominal: Soft. Bowel sounds are normal. No distension. There is no tenderness. There is no guarding.  Musculoskeletal: No edema or tenderness.  Lymphadenopathy: No cervical, axillary or supraclavicular adenopathy.  Neurological: Alert and oriented to person, place, and time. No cranial nerve deficit.  Skin: Skin is warm and dry. No rash noted. No erythema. No pallor.  Psychiatric: Affect and judgment normal.   Labs Infusion on 07/15/2017  Component Date Value Ref Range Status  . LDH 07/15/2017 156  98 -  192 U/L Final   Performed at [facility], [address], [place], [place] [address]  . Sodium 07/15/2017 140  135 - 145 mmol/L Final  . Potassium 07/15/2017 3.7  3.5 - 5.1 mmol/L Final  . Chloride 07/15/2017 104  101 - 111 mmol/L Final  . CO2 07/15/2017 24  22 - 32 mmol/L Final  . Glucose, Bld 07/15/2017 133* 65 -  99 mg/dL Final  . BUN 07/15/2017 10  6 - 20 mg/dL Final  . Creatinine, Ser 07/15/2017 1.00  0.61 - 1.24 mg/dL Final  . Calcium 07/15/2017 9.0  8.9 - 10.3 mg/dL Final  . Total Protein 07/15/2017 6.8  6.5 - 8.1 g/dL Final  . Albumin 07/15/2017 3.9  3.5 - 5.0 g/dL Final  . AST 07/15/2017 22  15 - 41 U/L Final  . ALT 07/15/2017 23  17 - 63 U/L Final  . Alkaline Phosphatase 07/15/2017 89  38 - 126 U/L Final  . Total Bilirubin 07/15/2017 0.4  0.3 - 1.2 mg/dL Final  . GFR calc non Af Amer 07/15/2017 >60  >60 mL/min Final  . GFR calc Af Amer 07/15/2017 >60  >60 mL/min Final   Comment: (NOTE) The eGFR has been calculated using the CKD EPI equation. This calculation has not been validated in all clinical situations. eGFR's persistently <60 mL/min signify possible Chronic Kidney Disease.   Georgiann Hahn gap 07/15/2017 12  5 - 15 Final   Performed at [facility], [address] [place], [place] [address]  . WBC 07/15/2017 9.3  4.0 - 10.5 K/uL Final  . RBC 07/15/2017 5.12  4.22 - 5.81 MIL/uL Final  . Hemoglobin 07/15/2017 14.1  13.0 - 17.0 g/dL Final  . HCT 07/15/2017 44.3  39.0 - 52.0 % Final  . MCV 07/15/2017 86.5  78.0 - 100.0 fL Final  . MCH 07/15/2017 27.5  26.0 - 34.0 pg Final  . MCHC 07/15/2017 31.8  30.0 - 36.0 g/dL Final  . RDW 07/15/2017 14.1  11.5 - 15.5 % Final  . Platelets 07/15/2017 232  150 - 400 K/uL Final  . Neutrophils Relative % 07/15/2017 43  % Final  . Neutro Abs 07/15/2017 4.0  1.7 - 7.7 K/uL Final  . Lymphocytes Relative 07/15/2017 41  % Final  . Lymphs Abs 07/15/2017 3.8  0.7 - 4.0 K/uL Final  . Monocytes Relative 07/15/2017 8  % Final  . Monocytes Absolute  07/15/2017 0.8  0.1 - 1.0 K/uL Final  . Eosinophils Relative 07/15/2017 7  % Final  . Eosinophils Absolute 07/15/2017 0.7  0.0 - 0.7 K/uL Final  . Basophils Relative 07/15/2017 1  % Final  . Basophils Absolute 07/15/2017 0.1  0.0 - 0.1 K/uL Final   Performed at [facility], [address], [place], [place] [address]  . TSH 07/15/2017 2.707  0.350 - 4.500 uIU/mL Final   Comment: Performed by a 3rd Generation assay with a functional sensitivity of <=0.01 uIU/mL. Performed at [facility], [address] [place], [place] [address]      Pathology No orders of the defined types were placed in this encounter.      Zoila Shutter MD

## 2017-07-21 NOTE — Telephone Encounter (Signed)
Dr. Walden Field spoke with Dr. Laural Golden regarding this pt and his recent scan that he had done. The scan shows and area in the colon that should have further follow up, Dr. Laural Golden agreed that the pt could benefit from a colonoscopy.  I called and spoke with the pt's daughter [name] and advised her that Dr. Laural Golden would like to do a colonoscopy on the her dad and that his office would be calling her to get that scheduled for him.  I spoke with the pt and informed him that Dr. Walden Field spoke with Dr. Laural Golden and that he thinks it's a good idea to do a colonoscopy on him. I also informed him that they would be contacting his daughter to make his appointment. He verbalized understanding.

## 2017-07-22 ENCOUNTER — Encounter (INDEPENDENT_AMBULATORY_CARE_PROVIDER_SITE_OTHER): Payer: Self-pay | Admitting: *Deleted

## 2017-07-22 ENCOUNTER — Other Ambulatory Visit (INDEPENDENT_AMBULATORY_CARE_PROVIDER_SITE_OTHER): Payer: Self-pay | Admitting: *Deleted

## 2017-07-22 ENCOUNTER — Telehealth (INDEPENDENT_AMBULATORY_CARE_PROVIDER_SITE_OTHER): Payer: Self-pay | Admitting: *Deleted

## 2017-07-22 DIAGNOSIS — R935 Abnormal findings on diagnostic imaging of other abdominal regions, including retroperitoneum: Secondary | ICD-10-CM | POA: Insufficient documentation

## 2017-07-22 NOTE — Telephone Encounter (Signed)
Patient needs trilyte 

## 2017-07-23 MED ORDER — PEG 3350-KCL-NA BICARB-NACL 420 G PO SOLR: 4000.0000 mL | Freq: Once | ORAL | 0 refills | Status: AC

## 2017-07-28 ENCOUNTER — Encounter (HOSPITAL_COMMUNITY)
Admission: RE | Disposition: A | Discharge status: Home / Self Care | Payer: Self-pay | Source: Ambulatory Visit | Attending: Internal Medicine

## 2017-07-28 ENCOUNTER — Encounter (HOSPITAL_COMMUNITY): Payer: Self-pay | Admitting: *Deleted

## 2017-07-28 ENCOUNTER — Other Ambulatory Visit: Payer: Self-pay

## 2017-07-28 ENCOUNTER — Ambulatory Visit (HOSPITAL_COMMUNITY)
Admission: RE | Admit: 2017-07-28 | Discharge: 2017-07-28 | Disposition: A | Discharge status: Home / Self Care | Payer: Medicare Other | Source: Ambulatory Visit | Attending: Internal Medicine | Admitting: Internal Medicine

## 2017-07-28 DIAGNOSIS — I10 Essential (primary) hypertension: Secondary | ICD-10-CM | POA: Insufficient documentation

## 2017-07-28 DIAGNOSIS — C3491 Malignant neoplasm of unspecified part of right bronchus or lung: Secondary | ICD-10-CM | POA: Insufficient documentation

## 2017-07-28 DIAGNOSIS — Z7984 Long term (current) use of oral hypoglycemic drugs: Secondary | ICD-10-CM | POA: Diagnosis not present

## 2017-07-28 DIAGNOSIS — E119 Type 2 diabetes mellitus without complications: Secondary | ICD-10-CM | POA: Diagnosis not present

## 2017-07-28 DIAGNOSIS — Z79899 Other long term (current) drug therapy: Secondary | ICD-10-CM | POA: Insufficient documentation

## 2017-07-28 DIAGNOSIS — F17210 Nicotine dependence, cigarettes, uncomplicated: Secondary | ICD-10-CM | POA: Insufficient documentation

## 2017-07-28 DIAGNOSIS — K644 Residual hemorrhoidal skin tags: Secondary | ICD-10-CM | POA: Insufficient documentation

## 2017-07-28 DIAGNOSIS — Z86711 Personal history of pulmonary embolism: Secondary | ICD-10-CM | POA: Diagnosis not present

## 2017-07-28 DIAGNOSIS — K6389 Other specified diseases of intestine: Secondary | ICD-10-CM | POA: Diagnosis not present

## 2017-07-28 DIAGNOSIS — K648 Other hemorrhoids: Secondary | ICD-10-CM | POA: Diagnosis not present

## 2017-07-28 DIAGNOSIS — R935 Abnormal findings on diagnostic imaging of other abdominal regions, including retroperitoneum: Secondary | ICD-10-CM

## 2017-07-28 DIAGNOSIS — R933 Abnormal findings on diagnostic imaging of other parts of digestive tract: Secondary | ICD-10-CM | POA: Diagnosis not present

## 2017-07-28 HISTORY — PX: COLONOSCOPY: SHX5424

## 2017-07-28 LAB — GLUCOSE, CAPILLARY: Glucose-Capillary: 91 mg/dL (ref 65–99)

## 2017-07-28 SURGERY — COLONOSCOPY
Anesthesia: Moderate Sedation

## 2017-07-28 MED ORDER — MIDAZOLAM HCL 5 MG/5ML IJ SOLN
INTRAMUSCULAR | Status: DC | PRN
  Administered 2017-07-28 (×5): 2 mg via INTRAVENOUS

## 2017-07-28 MED ORDER — MIDAZOLAM HCL 5 MG/5ML IJ SOLN
INTRAMUSCULAR | Status: AC
  Filled 2017-07-28: qty 10

## 2017-07-28 MED ORDER — MEPERIDINE HCL 50 MG/ML IJ SOLN
INTRAMUSCULAR | Status: DC | PRN
  Administered 2017-07-28 (×3): 25 mg via INTRAVENOUS

## 2017-07-28 MED ORDER — SIMETHICONE 40 MG/0.6ML PO SUSP
ORAL | Status: DC | PRN
  Administered 2017-07-28: 14:00:00

## 2017-07-28 MED ORDER — SODIUM CHLORIDE 0.9 % IV SOLN
INTRAVENOUS | Status: DC
  Administered 2017-07-28: 14:00:00 via INTRAVENOUS

## 2017-07-28 MED ORDER — MEPERIDINE HCL 50 MG/ML IJ SOLN
INTRAMUSCULAR | Status: AC
  Filled 2017-07-28: qty 1

## 2017-07-28 NOTE — Discharge Instructions (Signed)
Colonoscopy, Adult, Care After This sheet gives you information about how to care for yourself after your procedure. Your doctor may also give you more specific instructions. If you have problems or questions, call your doctor. Follow these instructions at home: General instructions   For the first 24 hours after the procedure: ? Do not drive or use machinery. ? Do not sign important documents. ? Do not drink alcohol. ? Do your daily activities more slowly than normal. ? Eat foods that are soft and easy to digest. ? Rest often.  Take over-the-counter or prescription medicines only as told by your doctor.  It is up to you to get the results of your procedure. Ask your doctor, or the department performing the procedure, when your results will be ready. To help cramping and bloating:  Try walking around.  Put heat on your belly (abdomen) as told by your doctor. Use a heat source that your doctor recommends, such as a moist heat pack or a heating pad. ? Put a towel between your skin and the heat source. ? Leave the heat on for 20-30 minutes. ? Remove the heat if your skin turns bright red. This is especially important if you cannot feel pain, heat, or cold. You can get burned. Eating and drinking  Drink enough fluid to keep your pee (urine) clear or pale yellow.  Return to your normal diet as told by your doctor. Avoid heavy or fried foods that are hard to digest.  Avoid drinking alcohol for as long as told by your doctor. Contact a doctor if:  You have blood in your poop (stool) 2-3 days after the procedure. Get help right away if:  You have more than a small amount of blood in your poop.  You see large clumps of tissue (blood clots) in your poop.  Your belly is swollen.  You feel sick to your stomach (nauseous).  You throw up (vomit).  You have a fever.  You have belly pain that gets worse, and medicine does not help your pain. This information is not intended to  replace advice given to you by your health care provider. Make sure you discuss any questions you have with your health care provider.   Resume usual medications and diet as before. No driving for 24 hours. Follow-up with Dr. Walden Field of oncology as planned.   Hemorrhoids Hemorrhoids are swollen veins in and around the rectum or anus. Hemorrhoids can cause pain, itching, or bleeding. Most of the time, they do not cause serious problems. They usually get better with diet changes, lifestyle changes, and other home treatments. Follow these instructions at home: Eating and drinking  Eat foods that have fiber, such as whole grains, beans, nuts, fruits, and vegetables. Ask your doctor about taking products that have added fiber (fibersupplements).  Drink enough fluid to keep your pee (urine) clear or pale yellow. For Pain and Swelling  Take a warm-water bath (sitz bath) for 20 minutes to ease pain. Do this 3-4 times a day.  If directed, put ice on the painful area. It may be helpful to use ice between your warm baths. ? Put ice in a plastic bag. ? Place a towel between your skin and the bag. ? Leave the ice on for 20 minutes, 2-3 times a day. General instructions  Take over-the-counter and prescription medicines only as told by your doctor. ? Medicated creams and medicines that are inserted into the anus (suppositories) may be used or applied as told.  Exercise often.  Go to the bathroom when you have the urge to poop (to have a bowel movement). Do not wait.  Avoid pushing too hard (straining) when you poop.  Keep the butt area dry and clean. Use wet toilet paper or moist paper towels.  Do not sit on the toilet for a long time. Contact a doctor if:  You have any of these: ? Pain and swelling that do not get better with treatment or medicine. ? Bleeding that will not stop. ? Trouble pooping or you cannot poop. ? Pain or swelling outside the area of the hemorrhoids. This  information is not intended to replace advice given to you by your health care provider. Make sure you discuss any questions you have with your health care provider. Marland Kitchen

## 2017-07-28 NOTE — Op Note (Signed)
[facility] Patient Name: Darrell Christensen Procedure Date: 07/28/2017 1:51 PM MRN: [id number] Date of Birth: April 16, 1946 Attending MD: Hildred Laser , MD CSN: [id number] Age: 72 Admit Type: Outpatient Procedure:                Colonoscopy Indications:              Abnormal CT of the GI tract Providers:                Hildred Laser, MD, Otis Peak B. Sharon Seller, RN, Randa Spike, Technician Referring MD:             Zoila Shutter, MD Medicines:                Meperidine 75 mg IV, Midazolam 10 mg IV Complications:            No immediate complications. Estimated Blood Loss:     Estimated blood loss: none. Procedure:                Pre-Anesthesia Assessment:                           - Prior to the procedure, a History and Physical                            was performed, and patient medications and                            allergies were reviewed. The patient's tolerance of                            previous anesthesia was also reviewed. The risks                            and benefits of the procedure and the sedation                            options and risks were discussed with the patient.                            All questions were answered, and informed consent                            was obtained. Prior Anticoagulants: The patient has                            taken no previous anticoagulant or antiplatelet                            agents. ASA Grade Assessment: III - A patient with                            severe systemic disease. After reviewing the risks  and benefits, the patient was deemed in                            satisfactory condition to undergo the procedure.                           After obtaining informed consent, the colonoscope                            was passed under direct vision. Throughout the                            procedure, the patient's blood pressure, pulse, and        oxygen saturations were monitored continuously. The                            [id number] ([id number]) scope was introduced through                            the anus and advanced to the the cecum, identified                            by appendiceal orifice and ileocecal valve. The                            colonoscopy was performed without difficulty. The                            patient tolerated the procedure well. The quality                            of the bowel preparation was good. The ileocecal                            valve, appendiceal orifice, and rectum were                            photographed. Scope In: 2:38:52 PM Scope Out: 2:57:07 PM Scope Withdrawal Time: 0 hours 13 minutes 32 seconds  Total Procedure Duration: 0 hours 18 minutes 15 seconds  Findings:      The perianal and digital rectal examinations were normal.      One 12 mm submucosal nodule was found in the distal sigmoid colon.      The exam was otherwise normal throughout the examined colon.      External and internal hemorrhoids were found during retroflexion. The       hemorrhoids were small. Impression:               - Submucosal 12 mm firm lesion in the distal                            sigmoid colon.                           - External and internal hemorrhoids.                           -  No specimens collected.                           Comment: No evidence of colonic primary. This                            submucosal lesion possibly corresponds to CT                            finding.                           Findings reviewed with Dr. Walden Field. Moderate Sedation:      Moderate (conscious) sedation was administered by the endoscopy nurse       and supervised by the endoscopist. The following parameters were       monitored: oxygen saturation, heart rate, blood pressure, CO2       capnography and response to care. Total physician intraservice time was       29 minutes. Recommendation:            - Patient has a contact number available for                            emergencies. The signs and symptoms of potential                            delayed complications were discussed with the                            patient. Return to normal activities tomorrow.                            Written discharge instructions were provided to the                            patient.                           - Resume previous diet today.                           - Continue present medications.                           - F/U CT in three months.                           - No recommendation at this time regarding repeat                            colonoscopy. Procedure Code(s):        --- Professional ---                           [phone], Colonoscopy, flexible; diagnostic, including  collection of specimen(s) by brushing or washing,                            when performed (separate procedure)                           99152, Moderate sedation services provided by the                            same physician or other qualified health care                            professional performing the diagnostic or                            therapeutic service that the sedation supports,                            requiring the presence of an independent trained                            observer to assist in the monitoring of the                            patient's level of consciousness and physiological                            status; initial 15 minutes of intraservice time,                            patient age 51 years or older                           [phone], Moderate sedation services; each additional                            15 minutes intraservice time Diagnosis Code(s):        --- Professional ---                           K63.89, Other specified diseases of intestine                           K64.8, Other hemorrhoids                           R93.3,  Abnormal findings on diagnostic imaging of                            other parts of digestive tract CPT copyright 2016 [employer]. All rights reserved. The codes documented in this report are preliminary and upon coder review may  be revised to meet current compliance requirements. Hildred Laser, MD Hildred Laser, MD 07/28/2017 3:17:48 PM This report has been signed electronically. Number of Addenda: 0

## 2017-07-28 NOTE — H&P (Signed)
Darrell Christensen is an 72 y.o. male.   Chief Complaint: Patient is here for colonoscopy. HPI: She is 72 year old Caucasian male who has stage IV adenocarcinoma of right lung who is receiving palliative chemotherapy and lung mass has decreased in size.  Patient underwent restaging CT chest and abdominal pelvic CT and he was noted to have 2 nodules on the serosal surface of sigmoid colon along the mesenteric border concerning for metastatic disease or a different process.  Therefore Dr. Zoila Christensen patient's oncologist recommended diagnostic colonoscopy. Patient denies diarrhea constipation or rectal bleeding.  He has never been screened for colorectal carcinoma. Family history is negative for colorectal carcinoma.  Past Medical History:  Diagnosis Date  . 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 Diabetes mellitus ([place]) 05/02/2016  . Diabetes mellitus without complication ([place])   . Hypertension   . Mass of breast, right 04/01/2016  . Mass of upper lobe of right lung 04/01/2016   2 adjacent, 5 cm masses, hilar adenopathy  . Primary cancer of right upper lobe of lung ([place]) 04/01/2016   2 adjacent, 5 cm masses, hilar adenopathy  . Pulmonary embolus ([place]) 03/31/2016  . 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 PORTACATH PLACEMENT Left 05/13/2016   Procedure: INSERTION PORT-A-CATH;  Surgeon: Darrell Signs, MD;  Location: [facility];  Service: General;  Laterality: Left;    Family History  Problem Relation Age of Onset  . Stroke Mother   . Heart attack Father   . Heart attack Brother    Social History:  reports that he has been smoking e-cigarettes.  He has smoked for the past 61.00 years. He has never used smokeless tobacco. He reports that he does not drink alcohol or use drugs.  Allergies: No Known Allergies  Medications Prior to Admission  Medication Sig Dispense Refill  . ALPRAZolam (XANAX) 1 MG tablet Take 1 mg by mouth 3 (three) times daily as  needed for anxiety.    Marland Kitchen amLODipine (NORVASC) 2.5 MG tablet Take 2.5 mg by mouth daily.  4  . diphenoxylate-atropine (LOMOTIL) 2.5-0.025 MG tablet Take 1 tablet 4 (four) times daily as needed by mouth for diarrhea or loose stools. 60 tablet 0  . furosemide (LASIX) 20 MG tablet Take 2 tablets (40 mg total) by mouth daily. 20 tablet 0  . GLIPIZIDE XL 5 MG 24 hr tablet Take 5 mg by mouth daily.  4  . hydrocortisone cream 0.5 % Apply 1 application topically 2 (two) times daily. 30 g 0  . hydrOXYzine (VISTARIL) 25 MG capsule   1  . metFORMIN (GLUCOPHAGE) 500 MG tablet Take 500 mg by mouth 2 (two) times daily.  4  . metoprolol succinate (TOPROL-XL) 25 MG 24 hr tablet TAKE ONE TABLET BY MOUTH IN THE EVENING. 30 tablet 0  . nystatin (MYCOSTATIN) 100000 UNIT/ML suspension Take 5 mLs (500,000 Units total) by mouth 4 (four) times daily. 60 mL 0  . ondansetron (ZOFRAN) 8 MG tablet Take 8 mg by mouth every 8 (eight) hours as needed for nausea or vomiting.    Marland Kitchen oxyCODONE-acetaminophen (PERCOCET) 10-325 MG tablet   0  . Pembrolizumab (KEYTRUDA IV) Inject into the vein.    Marland Kitchen prochlorperazine (COMPAZINE) 10 MG tablet   1  . simvastatin (ZOCOR) 20 MG tablet Take 20 mg by mouth every evening.  5  . Skin Protectants, Misc. (EUCERIN) cream Apply topically as needed for dry skin. 454 g 1  .  spironolactone (ALDACTONE) 50 MG tablet Take 1 tablet (50 mg total) by mouth daily. 10 tablet 0  . hydrOXYzine (ATARAX/VISTARIL) 25 MG tablet Take 1 tablet (25 mg total) by mouth 3 (three) times daily as needed. 45 tablet 3  . lidocaine-prilocaine (EMLA) cream Apply a quarter size amount to affected area 1 hour prior to coming to chemotherapy. Cover with plastic wrap. 30 g 2  . naproxen (NAPROSYN) 500 MG tablet Take 1 tablet (500 mg total) by mouth 2 (two) times daily with a meal. 30 tablet 1  . predniSONE (DELTASONE) 20 MG tablet Take at the onset of diarrhea.  Take 5 tablets (100 mg) at one time.  Then call the [place]. 30  tablet 0    Results for orders placed or performed during the hospital encounter of 07/28/17 (from the past 48 hour(s))  Glucose, capillary     Status: None   Collection Time: 07/28/17  1:53 PM  Result Value Ref Range   Glucose-Capillary 91 65 - 99 mg/dL   No results found.  ROS  Blood pressure (!) 153/88, pulse 91, temperature (!) 97.5 F (36.4 C), temperature source Oral, resp. rate (!) 25, SpO2 99 %. Physical Exam  Constitutional: He appears well-developed and well-nourished.  HENT:  Mouth/Throat: Oropharynx is clear and moist.  Eyes: Conjunctivae are normal. No scleral icterus.  Neck: No thyromegaly present.  Cardiovascular: Normal rate, regular rhythm and normal heart sounds.  No murmur heard. Respiratory: Effort normal and breath sounds normal.  GI:  Abdomen is protuberant with umbilical hernia which is soft and reducible.  On palpation abdomen is soft as well.  There is mild tenderness at LLQ.  No organomegaly or masses.  Musculoskeletal: He exhibits no edema.  Lymphadenopathy:    He has no cervical adenopathy.  Neurological: He is alert.  Skin: Skin is warm and dry.    Abdominopelvic CT reviewed.  Assessment/Plan Abnormal CT revealing nodules on serosal surface of sigmoid colon in a patient with history of lung CA. Diagnostic colonoscopy to rule out another primary.  Hildred Laser, MD 07/28/2017, 2:21 PM

## 2017-07-30 ENCOUNTER — Encounter (HOSPITAL_COMMUNITY): Payer: Self-pay | Admitting: Internal Medicine

## 2017-08-05 ENCOUNTER — Ambulatory Visit (HOSPITAL_COMMUNITY): Payer: Medicare Other | Admitting: Hematology

## 2017-08-05 ENCOUNTER — Ambulatory Visit (HOSPITAL_COMMUNITY): Payer: Medicare Other

## 2017-08-07 ENCOUNTER — Ambulatory Visit (HOSPITAL_COMMUNITY): Payer: Medicare Other | Admitting: Hematology

## 2017-08-07 ENCOUNTER — Ambulatory Visit (HOSPITAL_COMMUNITY): Payer: Medicare Other

## 2017-08-14 ENCOUNTER — Other Ambulatory Visit: Payer: Self-pay

## 2017-08-14 ENCOUNTER — Inpatient Hospital Stay (HOSPITAL_COMMUNITY): Payer: Medicare Other | Attending: Adult Health | Admitting: Hematology

## 2017-08-14 ENCOUNTER — Encounter (HOSPITAL_COMMUNITY): Payer: Self-pay | Admitting: Hematology

## 2017-08-14 VITALS — BP 171/91 | HR 98 | Temp 98.6°F | Resp 18

## 2017-08-14 DIAGNOSIS — Z9221 Personal history of antineoplastic chemotherapy: Secondary | ICD-10-CM | POA: Insufficient documentation

## 2017-08-14 DIAGNOSIS — C3411 Malignant neoplasm of upper lobe, right bronchus or lung: Secondary | ICD-10-CM | POA: Diagnosis not present

## 2017-08-14 DIAGNOSIS — Z79899 Other long term (current) drug therapy: Secondary | ICD-10-CM | POA: Insufficient documentation

## 2017-08-14 NOTE — Progress Notes (Signed)
[address] [place] [address], [place] [address]   CLINIC:  Medical Oncology/Hematology  PCP:  Celene Squibb, MD [place] [address] [phone]   REASON FOR VISIT:  Follow-up for metastatic lung cancer.  CURRENT THERAPY: Pembrolizumab every 3 weeks. BRIEF ONCOLOGIC HISTORY:    Primary cancer of right upper lobe of lung ([facility])   05/14/2015 Procedure    Port placement by Dr. Arnoldo Morale      04/01/2016 Initial Diagnosis    Primary cancer of right upper lobe of lung ([facility])      04/02/2016 Procedure    Ultrasound-guided core biopsy performed of a solid 1.5 cm soft tissue nodule in the right chest wall.      04/03/2016 Imaging    MRI brain- No acute and intracranial process or metastasis.  Moderate chronic small vessel ischemic disease.      04/04/2016 Pathology Results    Diagnosis Soft Tissue Needle Core Biopsy, Right Chest Wall SMOOTH MUSCLE NEOPLASM Microscopic Comment The differential diagnosis include low grade leiomyosarcoma. The neoplasm shows spindle cell morphology with rare mitotic figures and minimal atypia, no necrosis present. These cells stains positive for smooth muscle actin, desmin, negative for cytokeratin AE1&3, ck8/18, s100, cd117, cd 34 and cd99. This case also reviewed by Dr. [name] and agree.      04/05/2016 Procedure    CT-guided biopsy of right upper lobe lesion, with tissue specimen sent to pathology, by IR.      04/09/2016 Pathology Results    Lung, needle/core biopsy(ies), Right Upper Lobe - NON SMALL CELL CARCINOMA.      04/17/2016 Pathology Results    PDL1 High Expression.  TPS 50%.      04/24/2016 PET scan    1. Prominently hypermetabolic large right upper lobe mass with metastatic disease to the right paratracheal and right hilar nodal chains, to the descending mesocolon, to the left upper buttock subcutaneous tissues, to the left upper lobe, to the right subscapularis muscle, and possibly  to the left adrenal gland and right breast subcutaneous tissues. Appearance compatible with stage IV lung cancer. 2. There is some focal high activity in the ascending colon which is probably from peristalsis, less likely from a local colon mass. This does raise the possibility that the adjacent mesocolon tumor implant could be due to synchronous colon cancer rather than lung metastasis. 3. Other imaging findings of potential clinical significance: Coronary, aortic arch, and branch vessel atherosclerotic vascular disease. Aortoiliac atherosclerotic vascular disease. Deformity left proximal humerus and left glenohumeral joint from prior trauma. Enlarged prostate gland.      04/26/2016 Pathology Results    FoundationONE: Genomic alterations identified- KRAS U88K, CREBBP splice site [id number], LRP1B S515*, PHX50 V69*, VXY80 splice site [id number], SPTA1 splice site [id number], TP53 C135Y.  Additional findings- MS-Stable, TMB-intermediate (14 Muts/Mb).  No reportable alterations- EGFR, ALK, BRAF, MET, ERBB2, RET, ROS1.      05/17/2016 -  Chemotherapy    The patient had ONDANSETRON IVPB [facility] +/- DEXAMETHASONE, , Intravenous,  Once, 0 of 1 cycle  pembrolizumab (KEYTRUDA) 200 mg in sodium chloride 0.9 % 50 mL chemo infusion, 200 mg, Intravenous, Once, 0 of 5 cycles  for chemotherapy treatment.        08/06/2016 PET scan    IMPRESSION: 1. Overall considerable improvement. The right upper lobe mass is markedly reduced in volume and SUV. Thoracic adenopathy have resolved and the hypermetabolic lesion in the right subscapularis muscle has also resolved. 2. The  left upper lobe nodule is no longer appreciably hypermetabolic, and is highly indistinct although this may be due to motion artifact. Faint in indistinct nodularity elsewhere in the lungs likewise not currently hypermetabolic. 3. There is some very faint residual low-grade activity along the subcutaneous deposit along the upper  left buttock region. Marked reduction in size and activity of the tumor deposit adjacent to the descending colon. 4. Prior right pleural effusion has resolved. 5. Stable appearance of the adrenal glands, with low-level activity and a nodule in the left adrenal gland. 6. Stable small nodule in the right breast, low-grade metabolic activity with maximum SUV 2.2 (formerly 3.0) 7. Other imaging findings of potential clinical significance: Severe arthropathy of the left glenohumeral joint. Coronary, aortic arch, and branch vessel atherosclerotic vascular disease. Aortoiliac atherosclerotic vascular disease. Prominent prostate gland indents the bladder base.        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  Two adjacent large masslike areas of consolidation within the right upper lobe with differential considerations including malignancy or pneumonia. Multiple enlarged right hilar and mediastinal lymph nodes which may be reactive or metastatic in etiology.  Additional indeterminate pulmonary nodules as above. Recommend attention on follow-up.  Indeterminate 2.9 cm left adrenal nodule. This needs dedicated evaluation with pre and post contrast-enhanced CT or MRI after confirmation of pulmonary process.  Indeterminate nodule within the right breast. Recommend dedicated evaluation with mammography.  Hepatic steatosis.      04/02/2016 Procedure    Ultrasound-guided core biopsy performed of a solid 1.5 cm soft tissue nodule in the right chest wall.      04/04/2016 Pathology Results    Diagnosis Soft Tissue Needle Core Biopsy, Right Chest Wall SMOOTH MUSCLE NEOPLASM Microscopic Comment The differential diagnosis include low grade leiomyosarcoma. The neoplasm shows spindle cell morphology with rare mitotic figures and minimal atypia, no  necrosis present. These cells stains positive for smooth muscle actin, desmin, negative for cytokeratin AE1&3, ck8/18, s100, cd117, cd 34 and cd99. This case also reviewed by Dr. [name] and agree.         CANCER STAGING: Cancer Staging Primary cancer of right upper lobe of lung ([facility]) Staging form: Lung, AJCC 8th Edition - Clinical: Stage IVB (cT3, cN2, cM1c) - Signed by Baird Cancer, PA-C on 05/07/2016    INTERVAL HISTORY:  Darrell Christensen 72 y.o. male returns for routine follow-up of colonoscopy reports.  He was sent for colonoscopy because of abnormal CT scan in the colon.  His lung lesions showed continuing response.  He underwent colonoscopy without any major problems.  He complains of itching on the upper back and sometimes on the front.  He is using Eucerin and also takes Atarax as needed.  None of them seem to help.   REVIEW OF SYSTEMS:  Review of Systems  Genitourinary: Negative.    Skin: Positive for itching.  Neurological: Negative.   Hematological: Negative.   Psychiatric/Behavioral: Negative.   All other systems reviewed and are negative.    PAST MEDICAL/SURGICAL HISTORY:  Past Medical History:  Diagnosis Date  . Adrenal mass, left ([facility]) 04/01/2016  . Diabetes mellitus ([place] [facility]) 05/02/2016  . Diabetes mellitus without complication ([facility])   . Hypertension   . Mass of breast, right 04/01/2016  . Mass of upper lobe of right lung 04/01/2016   2 adjacent, 5 cm masses, hilar adenopathy  . Primary  cancer of right upper lobe of lung ([place]) 04/01/2016   2 adjacent, 5 cm masses, hilar adenopathy  . Pulmonary embolus ([facility]) 03/31/2016  . 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 COLONOSCOPY N/A 07/28/2017   Procedure: COLONOSCOPY;  Surgeon: Rogene Houston, MD;  Location: [facility] ENDO SUITE;  Service: Endoscopy;  Laterality: N/A;  205  . PORTACATH PLACEMENT Left 05/13/2016   Procedure: INSERTION PORT-A-CATH;  Surgeon: Aviva Signs, MD;   Location: [facility] ORS;  Service: General;  Laterality: Left;     SOCIAL HISTORY:  Social History   Socioeconomic History  . Marital status: Divorced    Spouse name: Not on file  . Number of children: Not on file  . Years of education: Not on file  . Highest education level: Not on file  Occupational History  . Not on file  Social Needs  . Financial resource strain: Not on file  . Food insecurity:    Worry: Not on file    Inability: Not on file  . Transportation needs:    Medical: Not on file    Non-medical: Not on file  Tobacco Use  . Smoking status: Current Every Day Smoker    Years: 61.00    Types: E-cigarettes    Last attempt to quit: 04/11/2014    Years since quitting: 3.3  . Smokeless tobacco: Never Used  . Tobacco comment: patient does vape smoking x 2 years  Substance and Sexual Activity  . Alcohol use: No    Comment: quit 10 years  . Drug use: No  . Sexual activity: Not on file  Lifestyle  . Physical activity:    Days per week: Not on file    Minutes per session: Not on file  . Stress: Not on file  Relationships  . Social connections:    Talks on phone: Not on file    Gets together: Not on file    Attends religious service: Not on file    Active member of club or organization: Not on file    Attends meetings of clubs or organizations: Not on file    Relationship status: Not on file  . Intimate partner violence:    Fear of current or ex partner: Not on file    Emotionally abused: Not on file    Physically abused: Not on file    Forced sexual activity: Not on file  Other Topics Concern  . Not on file  Social History Narrative  . Not on file    FAMILY HISTORY:  Family History  Problem Relation Age of Onset  . Stroke Mother   . Heart attack Father   . Heart attack Brother     CURRENT MEDICATIONS:  Outpatient Encounter Medications as of 08/14/2017  Medication Sig  . ALPRAZolam (XANAX) 1 MG tablet Take 1 mg by mouth 3 (three) times daily as needed for  anxiety.  Marland Kitchen amLODipine (NORVASC) 2.5 MG tablet Take 2.5 mg by mouth daily.  . diphenoxylate-atropine (LOMOTIL) 2.5-0.025 MG tablet Take 1 tablet 4 (four) times daily as needed by mouth for diarrhea or loose stools.  . furosemide (LASIX) 20 MG tablet Take 2 tablets (40 mg total) by mouth daily.  Marland Kitchen GLIPIZIDE XL 5 MG 24 hr tablet Take 5 mg by mouth daily.  . hydrocortisone cream 0.5 % Apply 1 application topically 2 (two) times daily.  . hydrOXYzine (ATARAX/VISTARIL) 25 MG tablet Take 1 tablet (25 mg total) by mouth 3 (  three) times daily as needed.  . lidocaine-prilocaine (EMLA) cream Apply a quarter size amount to affected area 1 hour prior to coming to chemotherapy. Cover with plastic wrap.  . metFORMIN (GLUCOPHAGE) 500 MG tablet Take 500 mg by mouth 2 (two) times daily.  . metoprolol succinate (TOPROL-XL) 25 MG 24 hr tablet TAKE ONE TABLET BY MOUTH IN THE EVENING.  Marland Kitchen nystatin (MYCOSTATIN) 100000 UNIT/ML suspension Take 5 mLs (500,000 Units total) by mouth 4 (four) times daily.  . ondansetron (ZOFRAN) 8 MG tablet Take 8 mg by mouth every 8 (eight) hours as needed for nausea or vomiting.  Marland Kitchen oxyCODONE-acetaminophen (PERCOCET) 10-325 MG tablet   . Pembrolizumab (KEYTRUDA IV) Inject into the vein.  . predniSONE (DELTASONE) 20 MG tablet Take at the onset of diarrhea.  Take 5 tablets (100 mg) at one time.  Then call the [facility].  . prochlorperazine (COMPAZINE) 10 MG tablet   . simvastatin (ZOCOR) 20 MG tablet Take 20 mg by mouth every evening.  . Skin Protectants, Misc. (EUCERIN) cream Apply topically as needed for dry skin.  Marland Kitchen spironolactone (ALDACTONE) 50 MG tablet Take 1 tablet (50 mg total) by mouth daily.   No facility-administered encounter medications on file as of 08/14/2017.     ALLERGIES:  No Known Allergies   PHYSICAL EXAM:  ECOG Performance status: 1  Vitals:   08/14/17 1352  BP: (!) 171/91  Pulse: 98  Resp: 18  Temp: 98.6 F (37 C)  SpO2: 97%   There were no vitals  filed for this visit.  Physical Exam  Skin: Skin is dry. No rash noted.     LABORATORY DATA:  I have reviewed the labs as listed.  CBC    Component Value Date/Time   WBC 9.3 07/15/2017 0930   RBC 5.12 07/15/2017 0930   HGB 14.1 07/15/2017 0930   HGB 13.5 05/02/2016 0852   HCT 44.3 07/15/2017 0930   HCT 41.0 05/02/2016 0852   PLT 232 07/15/2017 0930   PLT 436 (H) 05/02/2016 0852   MCV 86.5 07/15/2017 0930   MCV 85.7 05/02/2016 0852   MCH 27.5 07/15/2017 0930   MCHC 31.8 07/15/2017 0930   RDW 14.1 07/15/2017 0930   RDW 13.9 05/02/2016 0852   LYMPHSABS 3.8 07/15/2017 0930   LYMPHSABS 3.4 (H) 05/02/2016 0852   MONOABS 0.8 07/15/2017 0930   MONOABS 1.2 (H) 05/02/2016 0852   EOSABS 0.7 07/15/2017 0930   EOSABS 1.6 (H) 05/02/2016 0852   BASOSABS 0.1 07/15/2017 0930   BASOSABS 0.2 (H) 05/02/2016 0852   CMP Latest Ref Rng & Units 07/15/2017 06/24/2017 06/03/2017  Glucose 65 - 99 mg/dL 133(H) 98 117(H)  BUN 6 - 20 mg/dL _0 Creatinine 0.61 - 1.24 mg/dL 1.00 1.10 1.28(H)  Sodium 135 - 145 mmol/L 140 138 138  Potassium 3.5 - 5.1 mmol/L 3.7 3.5 3.7  Chloride 101 - 111 mmol/L 104 102 105  CO2 22 - 32 mmol/L 24 26 21(L)  Calcium 8.9 - 10.3 mg/dL 9.0 8.9 8.8(L)  Total Protein 6.5 - 8.1 g/dL 6.8 7.1 7.0  Total Bilirubin 0.3 - 1.2 mg/dL 0.4 0.7 0.6  Alkaline Phos 38 - 126 U/L 89 83 76  AST 15 - 41 U/L _1 ALT 17 - 63 U/L 23 16(L) 14(L)       DIAGNOSTIC IMAGING:  I have reviewed his previous CT scans and agree with report.     ASSESSMENT & PLAN:   Primary cancer of right  upper lobe of lung ([place]) 1.  Stage IV adenocarcinoma of the lung: His most recent CT scan showed mixed findings with decrease in size of the lung nodules.  However there was a serosal mass in the colon.  Hence he was referred to Dr. Melony Overly for colonoscopy.  He underwent colonoscopy which showed submucosal 12 mm firm lesion in the sigmoid colon.  Otherwise the examination was normal.  External and  internal hemorrhoids were seen.  No biopsies were done.  Since this was a insignificant lesion, I have recommended him to continue with the same treatment of pembrolizumab every 3 weeks.  He is agreeable to this plan.  I plan to repeat CT scans in 3 months.  2.  Itching: Since he started pembrolizumab year ago, he is having itching on his upper trunk, predominantly on the back.  He is using moisturizing lotion.  He tried Atarax.  He still has some itching.  If it continues to be worse, I will give a trial of doxepin 5% cream.      Orders placed this encounter:  Orders Placed This Encounter  Procedures  . CBC with Differential  . Comprehensive metabolic panel  . CBC with Differential  . Comprehensive metabolic panel  . TSH      Darrell Christensen, [place] [phone]

## 2017-08-14 NOTE — Assessment & Plan Note (Addendum)
1.  Stage IV adenocarcinoma of the lung: His most recent CT scan showed mixed findings with decrease in size of the lung nodules.  However there was a serosal mass in the colon.  Hence he was referred to Dr. Melony Overly for colonoscopy.  He underwent colonoscopy which showed submucosal 12 mm firm lesion in the sigmoid colon.  Otherwise the examination was normal.  External and internal hemorrhoids were seen.  No biopsies were done.  Since this was a insignificant lesion, I have recommended him to continue with the same treatment of pembrolizumab every 3 weeks.  He is agreeable to this plan.  I plan to repeat CT scans in 3 months.  2.  Itching: Since he started pembrolizumab year ago, he is having itching on his upper trunk, predominantly on the back.  He is using moisturizing lotion.  He tried Atarax.  He still has some itching.  If it continues to be worse, I will give a trial of doxepin 5% cream.

## 2017-08-21 ENCOUNTER — Other Ambulatory Visit (HOSPITAL_COMMUNITY): Payer: Self-pay | Admitting: *Deleted

## 2017-08-21 ENCOUNTER — Encounter (HOSPITAL_COMMUNITY): Payer: Self-pay

## 2017-08-21 ENCOUNTER — Inpatient Hospital Stay (HOSPITAL_COMMUNITY): Payer: Medicare Other

## 2017-08-21 VITALS — BP 148/77 | HR 81 | Temp 97.4°F | Resp 18 | Wt 235.2 lb

## 2017-08-21 DIAGNOSIS — C3411 Malignant neoplasm of upper lobe, right bronchus or lung: Secondary | ICD-10-CM | POA: Diagnosis not present

## 2017-08-21 LAB — CBC WITH DIFFERENTIAL/PLATELET
Basophils Absolute: 0.1 10*3/uL (ref 0.0–0.1)
Basophils Relative: 1 %
Eosinophils Absolute: 0.6 10*3/uL (ref 0.0–0.7)
Eosinophils Relative: 6 %
HCT: 44.6 % (ref 39.0–52.0)
Hemoglobin: 14.8 g/dL (ref 13.0–17.0)
Lymphocytes Relative: 42 %
Lymphs Abs: 4.3 10*3/uL — ABNORMAL HIGH (ref 0.7–4.0)
MCH: 28.5 pg (ref 26.0–34.0)
MCHC: 33.2 g/dL (ref 30.0–36.0)
MCV: 85.9 fL (ref 78.0–100.0)
Monocytes Absolute: 0.9 10*3/uL (ref 0.1–1.0)
Monocytes Relative: 9 %
Neutro Abs: 4.3 10*3/uL (ref 1.7–7.7)
Neutrophils Relative %: 42 %
Platelets: 268 10*3/uL (ref 150–400)
RBC: 5.19 MIL/uL (ref 4.22–5.81)
RDW: 14.7 % (ref 11.5–15.5)
WBC: 10.2 10*3/uL (ref 4.0–10.5)

## 2017-08-21 LAB — COMPREHENSIVE METABOLIC PANEL
ALT: 22 U/L (ref 17–63)
AST: 20 U/L (ref 15–41)
Albumin: 4 g/dL (ref 3.5–5.0)
Alkaline Phosphatase: 85 U/L (ref 38–126)
Anion gap: 12 (ref 5–15)
BUN: 14 mg/dL (ref 6–20)
CO2: 24 mmol/L (ref 22–32)
Calcium: 9.2 mg/dL (ref 8.9–10.3)
Chloride: 102 mmol/L (ref 101–111)
Creatinine, Ser: 1.04 mg/dL (ref 0.61–1.24)
GFR calc Af Amer: >60 mL/min (ref >60)
GFR calc non Af Amer: >60 mL/min (ref >60)
Glucose, Bld: 125 mg/dL — ABNORMAL HIGH (ref 65–99)
Potassium: 3.7 mmol/L (ref 3.5–5.1)
Sodium: 138 mmol/L (ref 135–145)
Total Bilirubin: 0.7 mg/dL (ref 0.3–1.2)
Total Protein: 6.9 g/dL (ref 6.5–8.1)

## 2017-08-21 MED ORDER — SODIUM CHLORIDE 0.9 % IV SOLN
200.0000 mg | Freq: Once | INTRAVENOUS | Status: AC
  Administered 2017-08-21: 200 mg via INTRAVENOUS
  Filled 2017-08-21: qty 8

## 2017-08-21 MED ORDER — SODIUM CHLORIDE 0.9 % IV SOLN
Freq: Once | INTRAVENOUS | Status: AC
  Administered 2017-08-21: 500 mL via INTRAVENOUS

## 2017-08-21 MED ORDER — HEPARIN SOD (PORK) LOCK FLUSH 100 UNIT/ML IV SOLN
500.0000 [IU] | Freq: Once | INTRAVENOUS | Status: AC | PRN
  Administered 2017-08-21: 500 [IU]
  Filled 2017-08-21: qty 5

## 2017-08-21 MED ORDER — SODIUM CHLORIDE 0.9% FLUSH
10.0000 mL | INTRAVENOUS | Status: DC | PRN
  Administered 2017-08-21: 10 mL
  Filled 2017-08-21: qty 10

## 2017-08-21 NOTE — Progress Notes (Signed)
Patient to treatment area for labs and chemotherapy.  Rash noted on face, chest, and patient complains of itching in these areas plus his back, legs, and groin area.  Stated he is using the medications and creams as directed.  Last oncology follow up note remarks on patients rash/itching. Patient is a poor historian when reviewing medications.  Patient has been instructed to bring all medication bottles for review.  Patient stated his daughter used to put his medications together for him but is unable to do so anymore.    Reviewed labs and patients rash with itching with Dr. Delton Coombes.  Orders for Doxepin 5% cream BID or TID for skin. Patient made aware with understanding verbalized.   Prescription called [facility].   Patient tolerated treatment with no complaints of pain voiced.  Port site clean and dry with no bruising or swelling noted at site.  No complaints of pain with flush.  Band aid applied.  VSS with discharge and left ambulatory with no s/s of distress noted.  Reminded the patient to pick up prescription at [facility] with understanding verbalized.

## 2017-08-21 NOTE — Patient Instructions (Signed)
[facility] Discharge Instructions for Patients Receiving Chemotherapy  Today you received the following chemotherapy agents keytruda.    If you develop nausea and vomiting that is not controlled by your nausea medication, call the clinic.   BELOW ARE SYMPTOMS THAT SHOULD BE REPORTED IMMEDIATELY:  *FEVER GREATER THAN 100.5 F  *CHILLS WITH OR WITHOUT FEVER  NAUSEA AND VOMITING THAT IS NOT CONTROLLED WITH YOUR NAUSEA MEDICATION  *UNUSUAL SHORTNESS OF BREATH  *UNUSUAL BRUISING OR BLEEDING  TENDERNESS IN MOUTH AND THROAT WITH OR WITHOUT PRESENCE OF ULCERS  *URINARY PROBLEMS  *BOWEL PROBLEMS  UNUSUAL RASH Items with * indicate a potential emergency and should be followed up as soon as possible.  Feel free to call the clinic should you have any questions or concerns. The clinic phone number is [phone].  Please show the [facility] at check-in to the Emergency Department and triage nurse.

## 2017-09-11 ENCOUNTER — Encounter (HOSPITAL_COMMUNITY): Payer: Self-pay

## 2017-09-11 ENCOUNTER — Inpatient Hospital Stay (HOSPITAL_COMMUNITY): Payer: Medicare Other

## 2017-09-11 ENCOUNTER — Encounter (HOSPITAL_COMMUNITY): Payer: Self-pay | Admitting: Hematology

## 2017-09-11 ENCOUNTER — Inpatient Hospital Stay (HOSPITAL_COMMUNITY): Payer: Medicare Other | Attending: Adult Health

## 2017-09-11 ENCOUNTER — Inpatient Hospital Stay (HOSPITAL_BASED_OUTPATIENT_CLINIC_OR_DEPARTMENT_OTHER): Payer: Medicare Other | Admitting: Hematology

## 2017-09-11 VITALS — BP 121/68 | HR 80 | Temp 98.3°F | Resp 18 | Wt 234.8 lb

## 2017-09-11 DIAGNOSIS — Z5111 Encounter for antineoplastic chemotherapy: Secondary | ICD-10-CM | POA: Diagnosis not present

## 2017-09-11 DIAGNOSIS — C499 Malignant neoplasm of connective and soft tissue, unspecified: Secondary | ICD-10-CM

## 2017-09-11 DIAGNOSIS — Z5112 Encounter for antineoplastic immunotherapy: Secondary | ICD-10-CM

## 2017-09-11 DIAGNOSIS — Z79899 Other long term (current) drug therapy: Secondary | ICD-10-CM | POA: Insufficient documentation

## 2017-09-11 DIAGNOSIS — C3411 Malignant neoplasm of upper lobe, right bronchus or lung: Secondary | ICD-10-CM

## 2017-09-11 LAB — CBC WITH DIFFERENTIAL/PLATELET
Basophils Absolute: 0.1 10*3/uL (ref 0.0–0.1)
Basophils Relative: 1 %
Eosinophils Absolute: 0.7 10*3/uL (ref 0.0–0.7)
Eosinophils Relative: 6 %
HCT: 46.6 % (ref 39.0–52.0)
Hemoglobin: 15.2 g/dL (ref 13.0–17.0)
Lymphocytes Relative: 41 %
Lymphs Abs: 5.1 10*3/uL — ABNORMAL HIGH (ref 0.7–4.0)
MCH: 28.1 pg (ref 26.0–34.0)
MCHC: 32.6 g/dL (ref 30.0–36.0)
MCV: 86.3 fL (ref 78.0–100.0)
Monocytes Absolute: 1 10*3/uL (ref 0.1–1.0)
Monocytes Relative: 8 %
Neutro Abs: 5.6 10*3/uL (ref 1.7–7.7)
Neutrophils Relative %: 44 %
Platelets: 258 10*3/uL (ref 150–400)
RBC: 5.4 MIL/uL (ref 4.22–5.81)
RDW: 14.9 % (ref 11.5–15.5)
WBC: 12.6 10*3/uL — ABNORMAL HIGH (ref 4.0–10.5)

## 2017-09-11 LAB — COMPREHENSIVE METABOLIC PANEL
ALT: 23 U/L (ref 17–63)
AST: 21 U/L (ref 15–41)
Albumin: 4 g/dL (ref 3.5–5.0)
Alkaline Phosphatase: 79 U/L (ref 38–126)
Anion gap: 9 (ref 5–15)
BUN: 16 mg/dL (ref 6–20)
CO2: 27 mmol/L (ref 22–32)
Calcium: 9 mg/dL (ref 8.9–10.3)
Chloride: 106 mmol/L (ref 101–111)
Creatinine, Ser: 1.21 mg/dL (ref 0.61–1.24)
GFR calc Af Amer: >60 mL/min (ref >60)
GFR calc non Af Amer: 58 mL/min — ABNORMAL LOW (ref >60)
Glucose, Bld: 132 mg/dL — ABNORMAL HIGH (ref 65–99)
Potassium: 4 mmol/L (ref 3.5–5.1)
Sodium: 142 mmol/L (ref 135–145)
Total Bilirubin: 0.7 mg/dL (ref 0.3–1.2)
Total Protein: 7.1 g/dL (ref 6.5–8.1)

## 2017-09-11 LAB — TSH: TSH: 2.615 u[IU]/mL (ref 0.350–4.500)

## 2017-09-11 MED ORDER — SODIUM CHLORIDE 0.9 % IV SOLN
Freq: Once | INTRAVENOUS | Status: AC
  Administered 2017-09-11: 09:00:00 via INTRAVENOUS

## 2017-09-11 MED ORDER — SODIUM CHLORIDE 0.9% FLUSH
10.0000 mL | INTRAVENOUS | Status: DC | PRN
  Administered 2017-09-11: 10 mL
  Filled 2017-09-11: qty 10

## 2017-09-11 MED ORDER — SODIUM CHLORIDE 0.9 % IV SOLN
200.0000 mg | Freq: Once | INTRAVENOUS | Status: AC
  Administered 2017-09-11: 200 mg via INTRAVENOUS
  Filled 2017-09-11: qty 8

## 2017-09-11 MED ORDER — HEPARIN SOD (PORK) LOCK FLUSH 100 UNIT/ML IV SOLN
500.0000 [IU] | Freq: Once | INTRAVENOUS | Status: AC | PRN
  Administered 2017-09-11: 500 [IU]

## 2017-09-11 NOTE — Assessment & Plan Note (Signed)
1.  Stage IV adenocarcinoma of the lung, PDL 1-50%, other actionable mutations negative: -Metastatic disease in the right upper lobe, mediastinal and hilar adenopathy, descending mesocolon, left upper buttock subcutaneous tissue, left upper lobe, right subscapularis muscle, left adrenal gland, right breast subcutaneous tissue - Keytruda started on 05/17/2016, last CT scan on 07/09/2017 showing decrease in size of lung nodules, 2 nodules in the serosa of the sigmoid colon, status post colonoscopy which showed submucosal 12 mm firm lesion in the sigmoid colon, no biopsies done. -Has mild diarrhea 2 to 3 days immediately following each treatment.  He was instructed to call us if he has any severe diarrhea.  His labs today are adequate to proceed with his treatment.  He will be treated again in 3 weeks.  He will come back in 6 weeks to see me after CT scans of the chest, abdomen and pelvis.   2.  Itching: Since he started pembrolizumab, he is having itching on his upper trunk, predominantly on the back.  He tried doxepin cream which made it worse.  He will continue Atarax and hydrocortisone cream.

## 2017-09-11 NOTE — Progress Notes (Signed)
[facility] [place], [place] [address]   CLINIC:  Medical Oncology/Hematology  PCP:  Celene Squibb, MD [place] [address] [phone]   REASON FOR VISIT:  Follow-up for metastatic lung cancer.  CURRENT THERAPY: Pembrolizumab every 3 weeks.  BRIEF ONCOLOGIC HISTORY:    Primary cancer of right upper lobe of lung ([place])   05/14/2015 Procedure    Port placement by Dr. Arnoldo Morale      04/01/2016 Initial Diagnosis    Primary cancer of right upper lobe of lung ([facility])      04/02/2016 Procedure    Ultrasound-guided core biopsy performed of a solid 1.5 cm soft tissue nodule in the right chest wall.      04/03/2016 Imaging    MRI brain- No acute and intracranial process or metastasis.  Moderate chronic small vessel ischemic disease.      04/04/2016 Pathology Results    Diagnosis Soft Tissue Needle Core Biopsy, Right Chest Wall SMOOTH MUSCLE NEOPLASM Microscopic Comment The differential diagnosis include low grade leiomyosarcoma. The neoplasm shows spindle cell morphology with rare mitotic figures and minimal atypia, no necrosis present. These cells stains positive for smooth muscle actin, desmin, negative for cytokeratin AE1&3, ck8/18, s100, cd117, cd 34 and cd99. This case also reviewed by Dr. [name] and agree.      04/05/2016 Procedure    CT-guided biopsy of right upper lobe lesion, with tissue specimen sent to pathology, by IR.      04/09/2016 Pathology Results    Lung, needle/core biopsy(ies), Right Upper Lobe - NON SMALL CELL CARCINOMA.      04/17/2016 Pathology Results    PDL1 High Expression.  TPS 50%.      04/24/2016 PET scan    1. Prominently hypermetabolic large right upper lobe mass with metastatic disease to the right paratracheal and right hilar nodal chains, to the descending mesocolon, to the left upper buttock subcutaneous tissues, to the left upper lobe, to the right subscapularis muscle, and  possibly to the left adrenal gland and right breast subcutaneous tissues. Appearance compatible with stage IV lung cancer. 2. There is some focal high activity in the ascending colon which is probably from peristalsis, less likely from a local colon mass. This does raise the possibility that the adjacent mesocolon tumor implant could be due to synchronous colon cancer rather than lung metastasis. 3. Other imaging findings of potential clinical significance: Coronary, aortic arch, and branch vessel atherosclerotic vascular disease. Aortoiliac atherosclerotic vascular disease. Deformity left proximal humerus and left glenohumeral joint from prior trauma. Enlarged prostate gland.      04/26/2016 Pathology Results    FoundationONE: Genomic alterations identified- KRAS S01U, CREBBP splice site [id number], LRP1B S515*, DUK02 R42*, HCW23 splice site [id number]>YW, SPTA1 splice site [id number]>[id number], TP53 C135Y.  Additional findings- MS-Stable, TMB-intermediate (14 Muts/Mb).  No reportable alterations- EGFR, ALK, BRAF, MET, ERBB2, RET, ROS1.      05/17/2016 -  Chemotherapy    The patient had ONDANSETRON IVPB [facility] +/- DEXAMETHASONE, , Intravenous,  Once, 0 of 1 cycle  pembrolizumab (KEYTRUDA) 200 mg in sodium chloride 0.9 % 50 mL chemo infusion, 200 mg, Intravenous, Once, 0 of 5 cycles  for chemotherapy treatment.        08/06/2016 PET scan    IMPRESSION: 1. Overall considerable improvement. The right upper lobe mass is markedly reduced in volume and SUV. Thoracic adenopathy have resolved and the hypermetabolic lesion in the right subscapularis muscle has also resolved. 2.  The left upper lobe nodule is no longer appreciably hypermetabolic, and is highly indistinct although this may be due to motion artifact. Faint in indistinct nodularity elsewhere in the lungs likewise not currently hypermetabolic. 3. There is some very faint residual low-grade activity along the subcutaneous deposit along the  upper left buttock region. Marked reduction in size and activity of the tumor deposit adjacent to the descending colon. 4. Prior right pleural effusion has resolved. 5. Stable appearance of the adrenal glands, with low-level activity and a nodule in the left adrenal gland. 6. Stable small nodule in the right breast, low-grade metabolic activity with maximum SUV 2.2 (formerly 3.0) 7. Other imaging findings of potential clinical significance: Severe arthropathy of the left glenohumeral joint. Coronary, aortic arch, and branch vessel atherosclerotic vascular disease. Aortoiliac atherosclerotic vascular disease. Prominent prostate gland indents the bladder base.        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  Two adjacent large masslike areas of consolidation within the right upper lobe with differential considerations including malignancy or pneumonia. Multiple enlarged right hilar and mediastinal lymph nodes which may be reactive or metastatic in etiology.  Additional indeterminate pulmonary nodules as above. Recommend attention on follow-up.  Indeterminate 2.9 cm left adrenal nodule. This needs dedicated evaluation with pre and post contrast-enhanced CT or MRI after confirmation of pulmonary process.  Indeterminate nodule within the right breast. Recommend dedicated evaluation with mammography.  Hepatic steatosis.      04/02/2016 Procedure    Ultrasound-guided core biopsy performed of a solid 1.5 cm soft tissue nodule in the right chest wall.      04/04/2016 Pathology Results    Diagnosis Soft Tissue Needle Core Biopsy, Right Chest Wall SMOOTH MUSCLE NEOPLASM Microscopic Comment The differential diagnosis include low grade leiomyosarcoma. The neoplasm shows spindle cell morphology with rare mitotic figures and minimal atypia, no  necrosis present. These cells stains positive for smooth muscle actin, desmin, negative for cytokeratin AE1&3, ck8/18, s100, cd117, cd 34 and cd99. This case also reviewed by Dr. [name] and agree.         CANCER STAGING: Cancer Staging Primary cancer of right upper lobe of lung ([facility]) Staging form: Lung, AJCC 8th Edition - Clinical: Stage IVB (cT3, cN2, cM1c) - Signed by Baird Cancer, PA-C on 05/07/2016    INTERVAL HISTORY:  Darrell Christensen 72 y.o. male returns for routine follow-up for metastatic lung cancer and spindle cell sarcoma.   Due for next cycle of Keytruda.  He continues to have rash; states "cream didn't help at all and made it worse."  (this was Doxepin cream).  He has been taking Atarax PRN, which is somewhat helpful.  Endorses occasional abd pain. He has occasional diarrhea for 2-3 days after treatment.  He will have 4-5 stools; he is confused about which medicines he takes when he has diarrhea. Denies taking Imodium; states, "I take 5 of those pills that lady told me to take."  (on chart review it looks like prednisone was ordered by Dr. Talbert Cage back in 01/2017 with instructions to take 5 tabs for diarrhea at that time).    Notes hematuria on a weekend, "I filled up the toilet with blood."  States that he went to the ED "but they couldn't get no blood from me."  Denies h/o kidney stones.  Chart reviewed; this was back in 10/2016.  REVIEW OF SYSTEMS:  Review of Systems  Constitutional: Positive for appetite change and fatigue.  Gastrointestinal: Positive for diarrhea.  Skin: Positive for itching.  All other systems reviewed and are negative.    PAST MEDICAL/SURGICAL HISTORY:  Past Medical History:  Diagnosis Date  . Adrenal mass, left ([facility]) 04/01/2016  . Diabetes mellitus ([facility]) 05/02/2016  . Diabetes mellitus without complication ([facility])   . Hypertension   . Mass of breast, right 04/01/2016  . Mass of upper lobe of right lung 04/01/2016   2 adjacent, 5 cm masses,  hilar adenopathy  . Primary cancer of right upper lobe of lung ([facility]) 04/01/2016   2 adjacent, 5 cm masses, hilar adenopathy  . Pulmonary embolus ([facility]) 03/31/2016  . Spindle cell sarcoma [facility])    Past Surgical History:  Procedure Laterality Date  . COLONOSCOPY N/A 07/28/2017   Procedure: COLONOSCOPY;  Surgeon: Rogene Houston, MD;  Location: [facility] ENDO SUITE;  Service: Endoscopy;  Laterality: N/A;  205  . PORTACATH PLACEMENT Left 05/13/2016   Procedure: INSERTION PORT-A-CATH;  Surgeon: Aviva Signs, MD;  Location: [facility] ORS;  Service: General;  Laterality: Left;     SOCIAL HISTORY:  Social History   Socioeconomic History  . Marital status: Divorced    Spouse name: Not on file  . Number of children: Not on file  . Years of education: Not on file  . Highest education level: Not on file  Occupational History  . Not on file  Social Needs  . Financial resource strain: Not on file  . Food insecurity:    Worry: Not on file    Inability: Not on file  . Transportation needs:    Medical: Not on file    Non-medical: Not on file  Tobacco Use  . Smoking status: Current Every Day Smoker    Years: 61.00    Types: E-cigarettes    Last attempt to quit: 04/11/2014    Years since quitting: 3.4  . Smokeless tobacco: Never Used  . Tobacco comment: patient does vape smoking x 2 years  Substance and Sexual Activity  . Alcohol use: No    Comment: quit 10 years  . Drug use: No  . Sexual activity: Not on file  Lifestyle  . Physical activity:    Days per week: Not on file    Minutes per session: Not on file  . Stress: Not on file  Relationships  . Social connections:    Talks on phone: Not on file    Gets together: Not on file    Attends religious service: Not on file    Active member of club or organization: Not on file    Attends meetings of clubs or organizations: Not on file    Relationship status: Not on file  . Intimate partner violence:    Fear of current or ex partner: Not on file     Emotionally abused: Not on file    Physically abused: Not on file    Forced sexual activity: Not on file  Other Topics Concern  . Not on file  Social History Narrative  . Not on file    FAMILY HISTORY:  Family History  Problem Relation Age of Onset  . Stroke Mother   . Heart attack Father   . Heart attack Brother     CURRENT MEDICATIONS:  Outpatient Encounter Medications as of 09/11/2017  Medication Sig  . ALPRAZolam (XANAX) 1 MG tablet Take 1 mg by mouth 3 (three) times daily as  needed for anxiety.  Marland Kitchen amLODipine (NORVASC) 2.5 MG tablet Take 2.5 mg by mouth daily.  . diphenoxylate-atropine (LOMOTIL) 2.5-0.025 MG tablet Take 1 tablet 4 (four) times daily as needed by mouth for diarrhea or loose stools.  . furosemide (LASIX) 20 MG tablet Take 2 tablets (40 mg total) by mouth daily.  Marland Kitchen GLIPIZIDE XL 5 MG 24 hr tablet Take 5 mg by mouth daily.  . hydrocortisone cream 0.5 % Apply 1 application topically 2 (two) times daily.  . hydrOXYzine (ATARAX/VISTARIL) 25 MG tablet Take 1 tablet (25 mg total) by mouth 3 (three) times daily as needed.  . lidocaine-prilocaine (EMLA) cream Apply a quarter size amount to affected area 1 hour prior to coming to chemotherapy. Cover with plastic wrap.  . metFORMIN (GLUCOPHAGE) 500 MG tablet Take 500 mg by mouth 2 (two) times daily.  . metoprolol succinate (TOPROL-XL) 25 MG 24 hr tablet TAKE ONE TABLET BY MOUTH IN THE EVENING.  Marland Kitchen nystatin (MYCOSTATIN) 100000 UNIT/ML suspension Take 5 mLs (500,000 Units total) by mouth 4 (four) times daily.  . ondansetron (ZOFRAN) 8 MG tablet Take 8 mg by mouth every 8 (eight) hours as needed for nausea or vomiting.  Marland Kitchen oxyCODONE-acetaminophen (PERCOCET) 10-325 MG tablet   . Pembrolizumab (KEYTRUDA IV) Inject into the vein.  . predniSONE (DELTASONE) 20 MG tablet Take at the onset of diarrhea.  Take 5 tablets (100 mg) at one time.  Then call the [facility].  . prochlorperazine (COMPAZINE) 10 MG tablet   . simvastatin (ZOCOR)  20 MG tablet Take 20 mg by mouth every evening.  . Skin Protectants, Misc. (EUCERIN) cream Apply topically as needed for dry skin.  Marland Kitchen spironolactone (ALDACTONE) 50 MG tablet Take 1 tablet (50 mg total) by mouth daily.  Marland Kitchen triamcinolone cream (KENALOG) 0.1 % Apply 1 application topically 2 (two) times daily.   No facility-administered encounter medications on file as of 09/11/2017.     ALLERGIES:  No Known Allergies   PHYSICAL EXAM:  ECOG Performance status: 1  Physical Exam  I have examined the upper back.  There is no rash.  No scratch marks.   LABORATORY DATA:  I have reviewed the labs as listed.  CBC    Component Value Date/Time   WBC 12.6 (H) 09/11/2017 0804   RBC 5.40 09/11/2017 0804   HGB 15.2 09/11/2017 0804   HGB 13.5 05/02/2016 0852   HCT 46.6 09/11/2017 0804   HCT 41.0 05/02/2016 0852   PLT 258 09/11/2017 0804   PLT 436 (H) 05/02/2016 0852   MCV 86.3 09/11/2017 0804   MCV 85.7 05/02/2016 0852   MCH 28.1 09/11/2017 0804   MCHC 32.6 09/11/2017 0804   RDW 14.9 09/11/2017 0804   RDW 13.9 05/02/2016 0852   LYMPHSABS 5.1 (H) 09/11/2017 0804   LYMPHSABS 3.4 (H) 05/02/2016 0852   MONOABS 1.0 09/11/2017 0804   MONOABS 1.2 (H) 05/02/2016 0852   EOSABS 0.7 09/11/2017 0804   EOSABS 1.6 (H) 05/02/2016 0852   BASOSABS 0.1 09/11/2017 0804   BASOSABS 0.2 (H) 05/02/2016 0852   CMP Latest Ref Rng & Units 09/11/2017 08/21/2017 07/15/2017  Glucose 65 - 99 mg/dL 132(H) 125(H) 133(H)  BUN 6 - 20 mg/dL '16 14 10  ' Creatinine 0.61 - 1.24 mg/dL 1.21 1.04 1.00  Sodium 135 - 145 mmol/L 142 138 140  Potassium 3.5 - 5.1 mmol/L 4.0 3.7 3.7  Chloride 101 - 111 mmol/L 106 102 104  CO2 22 - 32 mmol/L '27 24 24  ' Calcium  8.9 - 10.3 mg/dL 9.0 9.2 9.0  Total Protein 6.5 - 8.1 g/dL 7.1 6.9 6.8  Total Bilirubin 0.3 - 1.2 mg/dL 0.7 0.7 0.4  Alkaline Phos 38 - 126 U/L 79 85 89  AST 15 - 41 U/L '21 20 22  ' ALT 17 - 63 U/L '23 22 23         ' ASSESSMENT & PLAN:   Primary cancer of right upper  lobe of lung (HCC) 1.  Stage IV adenocarcinoma of the lung, PDL 1-50%, other actionable mutations negative: -Metastatic disease in the right upper lobe, mediastinal and hilar adenopathy, descending mesocolon, left upper buttock subcutaneous tissue, left upper lobe, right subscapularis muscle, left adrenal gland, right breast subcutaneous tissue - Keytruda started on 05/17/2016, last CT scan on 07/09/2017 showing decrease in size of lung nodules, 2 nodules in the serosa of the sigmoid colon, status post colonoscopy which showed submucosal 12 mm firm lesion in the sigmoid colon, no biopsies done. -Has mild diarrhea 2 to 3 days immediately following each treatment.  He was instructed to call us if he has any severe diarrhea.  His labs today are adequate to proceed with his treatment.  He will be treated again in 3 weeks.  He will come back in 6 weeks to see me after CT scans of the chest, abdomen and pelvis.   2.  Itching: Since he started pembrolizumab, he is having itching on his upper trunk, predominantly on the back.  He tried doxepin cream which made it worse.  He will continue Atarax and hydrocortisone cream.      Orders placed this encounter:  Orders Placed This Encounter  Procedures  . CT Chest W Contrast  . CT Abdomen Pelvis W Contrast     This note includes documentation from Mike Craze, NP, who was present during this patient's office visit and evaluation.  I have reviewed this note for its completeness and accuracy.  I have edited this note accordingly based on my findings and medical opinion.      Derek Jack, MD [place] [phone]

## 2017-09-11 NOTE — Patient Instructions (Signed)
[facility] Discharge Instructions for Patients Receiving Chemotherapy   Beginning January 23rd 2017 lab work for the [facility] will be done in the  Main lab at [facility] on 1st floor. If you have a lab appointment with the [facility] please come in thru the  Main Entrance and check in at the main information desk   Today you received the following chemotherapy agents Keytruda. Follow-up as scheduled. Call clinic for any questions or concerns  To help prevent nausea and vomiting after your treatment, we encourage you to take your nausea medication   If you develop nausea and vomiting, or diarrhea that is not controlled by your medication, call the clinic.  The clinic phone number is [phone]. Office hours are Monday-Friday 8:30am-5:00pm.  BELOW ARE SYMPTOMS THAT SHOULD BE REPORTED IMMEDIATELY:  *FEVER GREATER THAN 101.0 F  *CHILLS WITH OR WITHOUT FEVER  NAUSEA AND VOMITING THAT IS NOT CONTROLLED WITH YOUR NAUSEA MEDICATION  *UNUSUAL SHORTNESS OF BREATH  *UNUSUAL BRUISING OR BLEEDING  TENDERNESS IN MOUTH AND THROAT WITH OR WITHOUT PRESENCE OF ULCERS  *URINARY PROBLEMS  *BOWEL PROBLEMS  UNUSUAL RASH Items with * indicate a potential emergency and should be followed up as soon as possible. If you have an emergency after office hours please contact your primary care physician or go to the nearest emergency department.  Please call the clinic during office hours if you have any questions or concerns.   You may also contact the Patient Navigator at [phone] should you have any questions or need assistance in obtaining follow up care.      Resources For Cancer Patients and their Caregivers ? American Cancer Society: Can assist with transportation, wigs, general needs, runs Look Good Feel Better.        234 315 4608 ? Cancer Care: Provides financial assistance, online support groups, medication/co-pay assistance.  1-800-813-HOPE  819-051-1979) ? [place] Assists [place] cancer patients and their families through emotional , educational and financial support.  (607)527-4372 ? [place] DSS Where to apply for food stamps, Medicaid and utility assistance. 6190971692 ? RCATS: Transportation to medical appointments. 757-084-2137 ? Social Security Administration: May apply for disability if have a Stage IV cancer. 670-082-0259 (854) 108-6042 ? [employer], Disability and Transit Services: Assists with nutrition, care and transit needs. ([phone]

## 2017-09-11 NOTE — Progress Notes (Signed)
0900 Labs reviewed with and pt seen by Dr. Delton Coombes and pt approved for Keytruda infusion today per MD

## 2017-09-25 DIAGNOSIS — I10 Essential (primary) hypertension: Secondary | ICD-10-CM | POA: Diagnosis not present

## 2017-09-25 DIAGNOSIS — E785 Hyperlipidemia, unspecified: Secondary | ICD-10-CM | POA: Diagnosis not present

## 2017-09-26 DIAGNOSIS — C3411 Malignant neoplasm of upper lobe, right bronchus or lung: Secondary | ICD-10-CM | POA: Diagnosis not present

## 2017-09-26 DIAGNOSIS — I10 Essential (primary) hypertension: Secondary | ICD-10-CM | POA: Diagnosis not present

## 2017-09-26 DIAGNOSIS — E782 Mixed hyperlipidemia: Secondary | ICD-10-CM | POA: Diagnosis not present

## 2017-09-26 DIAGNOSIS — E119 Type 2 diabetes mellitus without complications: Secondary | ICD-10-CM | POA: Diagnosis not present

## 2017-10-02 ENCOUNTER — Encounter (HOSPITAL_COMMUNITY): Payer: Self-pay

## 2017-10-02 ENCOUNTER — Inpatient Hospital Stay (HOSPITAL_COMMUNITY): Payer: Medicare Other

## 2017-10-02 ENCOUNTER — Ambulatory Visit (HOSPITAL_COMMUNITY)
Admission: RE | Admit: 2017-10-02 | Discharge: 2017-10-02 | Disposition: A | Discharge status: Home / Self Care | Payer: Medicare Other | Source: Ambulatory Visit | Attending: Hematology | Admitting: Hematology

## 2017-10-02 ENCOUNTER — Other Ambulatory Visit: Payer: Self-pay

## 2017-10-02 VITALS — BP 111/89 | HR 71 | Temp 97.6°F | Resp 20 | Wt 231.4 lb

## 2017-10-02 DIAGNOSIS — I700 Atherosclerosis of aorta: Secondary | ICD-10-CM | POA: Insufficient documentation

## 2017-10-02 DIAGNOSIS — R05 Cough: Secondary | ICD-10-CM | POA: Diagnosis not present

## 2017-10-02 DIAGNOSIS — C3411 Malignant neoplasm of upper lobe, right bronchus or lung: Secondary | ICD-10-CM

## 2017-10-02 DIAGNOSIS — R918 Other nonspecific abnormal finding of lung field: Secondary | ICD-10-CM | POA: Diagnosis not present

## 2017-10-02 DIAGNOSIS — R0602 Shortness of breath: Secondary | ICD-10-CM | POA: Diagnosis not present

## 2017-10-02 DIAGNOSIS — R059 Cough, unspecified: Secondary | ICD-10-CM

## 2017-10-02 LAB — COMPREHENSIVE METABOLIC PANEL
ALT: 23 U/L (ref 17–63)
AST: 20 U/L (ref 15–41)
Albumin: 4 g/dL (ref 3.5–5.0)
Alkaline Phosphatase: 70 U/L (ref 38–126)
Anion gap: 11 (ref 5–15)
BUN: 19 mg/dL (ref 6–20)
CO2: 25 mmol/L (ref 22–32)
Calcium: 8.7 mg/dL — ABNORMAL LOW (ref 8.9–10.3)
Chloride: 104 mmol/L (ref 101–111)
Creatinine, Ser: 1.22 mg/dL (ref 0.61–1.24)
GFR calc Af Amer: >60 mL/min (ref >60)
GFR calc non Af Amer: 57 mL/min — ABNORMAL LOW (ref >60)
Glucose, Bld: 85 mg/dL (ref 65–99)
Potassium: 3.8 mmol/L (ref 3.5–5.1)
Sodium: 140 mmol/L (ref 135–145)
Total Bilirubin: 0.6 mg/dL (ref 0.3–1.2)
Total Protein: 7.3 g/dL (ref 6.5–8.1)

## 2017-10-02 LAB — CBC WITH DIFFERENTIAL/PLATELET
Basophils Absolute: 0.1 10*3/uL (ref 0.0–0.1)
Basophils Relative: 0 %
Eosinophils Absolute: 0.8 10*3/uL — ABNORMAL HIGH (ref 0.0–0.7)
Eosinophils Relative: 7 %
HCT: 45.1 % (ref 39.0–52.0)
Hemoglobin: 15.2 g/dL (ref 13.0–17.0)
Lymphocytes Relative: 47 %
Lymphs Abs: 5.3 10*3/uL — ABNORMAL HIGH (ref 0.7–4.0)
MCH: 29.3 pg (ref 26.0–34.0)
MCHC: 33.7 g/dL (ref 30.0–36.0)
MCV: 87.1 fL (ref 78.0–100.0)
Monocytes Absolute: 0.9 10*3/uL (ref 0.1–1.0)
Monocytes Relative: 8 %
Neutro Abs: 4.3 10*3/uL (ref 1.7–7.7)
Neutrophils Relative %: 38 %
Platelets: 243 10*3/uL (ref 150–400)
RBC: 5.18 MIL/uL (ref 4.22–5.81)
RDW: 14.7 % (ref 11.5–15.5)
WBC: 11.3 10*3/uL — ABNORMAL HIGH (ref 4.0–10.5)

## 2017-10-02 MED ORDER — HEPARIN SOD (PORK) LOCK FLUSH 100 UNIT/ML IV SOLN
500.0000 [IU] | Freq: Once | INTRAVENOUS | Status: AC | PRN
  Administered 2017-10-02: 500 [IU]

## 2017-10-02 MED ORDER — SODIUM CHLORIDE 0.9 % IV SOLN
Freq: Once | INTRAVENOUS | Status: AC
  Administered 2017-10-02: 10:00:00 via INTRAVENOUS

## 2017-10-02 MED ORDER — SODIUM CHLORIDE 0.9 % IV SOLN
200.0000 mg | Freq: Once | INTRAVENOUS | Status: AC
  Administered 2017-10-02: 200 mg via INTRAVENOUS
  Filled 2017-10-02: qty 8

## 2017-10-02 NOTE — Progress Notes (Signed)
Tolerated infusion w/o adverse reaction.  Alert, in no distress.  VSS.  Discharged ambulatory.  

## 2017-10-13 ENCOUNTER — Ambulatory Visit (HOSPITAL_COMMUNITY)
Admission: RE | Admit: 2017-10-13 | Discharge: 2017-10-13 | Disposition: A | Discharge status: Home / Self Care | Payer: Medicare Other | Source: Ambulatory Visit | Attending: Hematology | Admitting: Hematology

## 2017-10-13 DIAGNOSIS — E278 Other specified disorders of adrenal gland: Secondary | ICD-10-CM | POA: Insufficient documentation

## 2017-10-13 DIAGNOSIS — C499 Malignant neoplasm of connective and soft tissue, unspecified: Secondary | ICD-10-CM

## 2017-10-13 DIAGNOSIS — R918 Other nonspecific abnormal finding of lung field: Secondary | ICD-10-CM | POA: Insufficient documentation

## 2017-10-13 DIAGNOSIS — K6389 Other specified diseases of intestine: Secondary | ICD-10-CM | POA: Diagnosis not present

## 2017-10-13 DIAGNOSIS — C3490 Malignant neoplasm of unspecified part of unspecified bronchus or lung: Secondary | ICD-10-CM | POA: Diagnosis not present

## 2017-10-13 DIAGNOSIS — R590 Localized enlarged lymph nodes: Secondary | ICD-10-CM | POA: Diagnosis not present

## 2017-10-13 DIAGNOSIS — C3411 Malignant neoplasm of upper lobe, right bronchus or lung: Secondary | ICD-10-CM | POA: Insufficient documentation

## 2017-10-13 DIAGNOSIS — N6310 Unspecified lump in the right breast, unspecified quadrant: Secondary | ICD-10-CM | POA: Diagnosis not present

## 2017-10-13 MED ORDER — IOPAMIDOL (ISOVUE-300) INJECTION 61%
100.0000 mL | Freq: Once | INTRAVENOUS | Status: AC | PRN
  Administered 2017-10-13: 100 mL via INTRAVENOUS

## 2017-10-20 ENCOUNTER — Ambulatory Visit (HOSPITAL_COMMUNITY): Payer: Medicare Other

## 2017-10-22 ENCOUNTER — Other Ambulatory Visit (HOSPITAL_COMMUNITY): Payer: Self-pay

## 2017-10-22 DIAGNOSIS — C499 Malignant neoplasm of connective and soft tissue, unspecified: Secondary | ICD-10-CM

## 2017-10-22 DIAGNOSIS — C3411 Malignant neoplasm of upper lobe, right bronchus or lung: Secondary | ICD-10-CM

## 2017-10-23 ENCOUNTER — Encounter (HOSPITAL_COMMUNITY): Payer: Self-pay | Admitting: Internal Medicine

## 2017-10-23 ENCOUNTER — Other Ambulatory Visit: Payer: Self-pay

## 2017-10-23 ENCOUNTER — Inpatient Hospital Stay (HOSPITAL_COMMUNITY): Payer: Medicare Other

## 2017-10-23 ENCOUNTER — Inpatient Hospital Stay (HOSPITAL_COMMUNITY): Payer: Medicare Other | Admitting: Internal Medicine

## 2017-10-23 ENCOUNTER — Inpatient Hospital Stay (HOSPITAL_COMMUNITY): Payer: Medicare Other | Attending: Hematology

## 2017-10-23 VITALS — BP 152/86 | HR 88 | Temp 97.4°F | Resp 22 | Wt 233.5 lb

## 2017-10-23 VITALS — BP 153/89 | HR 60 | Temp 97.2°F | Resp 18

## 2017-10-23 DIAGNOSIS — R059 Cough, unspecified: Secondary | ICD-10-CM

## 2017-10-23 DIAGNOSIS — L299 Pruritus, unspecified: Secondary | ICD-10-CM | POA: Insufficient documentation

## 2017-10-23 DIAGNOSIS — C3411 Malignant neoplasm of upper lobe, right bronchus or lung: Secondary | ICD-10-CM

## 2017-10-23 DIAGNOSIS — Z79899 Other long term (current) drug therapy: Secondary | ICD-10-CM

## 2017-10-23 DIAGNOSIS — Z5111 Encounter for antineoplastic chemotherapy: Secondary | ICD-10-CM | POA: Insufficient documentation

## 2017-10-23 DIAGNOSIS — K760 Fatty (change of) liver, not elsewhere classified: Secondary | ICD-10-CM | POA: Insufficient documentation

## 2017-10-23 DIAGNOSIS — K644 Residual hemorrhoidal skin tags: Secondary | ICD-10-CM | POA: Diagnosis not present

## 2017-10-23 DIAGNOSIS — R05 Cough: Secondary | ICD-10-CM

## 2017-10-23 DIAGNOSIS — C499 Malignant neoplasm of connective and soft tissue, unspecified: Secondary | ICD-10-CM

## 2017-10-23 DIAGNOSIS — K648 Other hemorrhoids: Secondary | ICD-10-CM | POA: Insufficient documentation

## 2017-10-23 DIAGNOSIS — I10 Essential (primary) hypertension: Secondary | ICD-10-CM | POA: Diagnosis not present

## 2017-10-23 DIAGNOSIS — E119 Type 2 diabetes mellitus without complications: Secondary | ICD-10-CM | POA: Insufficient documentation

## 2017-10-23 LAB — CBC WITH DIFFERENTIAL/PLATELET
Basophils Absolute: 0.1 10*3/uL (ref 0.0–0.1)
Basophils Relative: 1 %
Eosinophils Absolute: 0.7 10*3/uL (ref 0.0–0.7)
Eosinophils Relative: 6 %
HCT: 46.6 % (ref 39.0–52.0)
Hemoglobin: 15.5 g/dL (ref 13.0–17.0)
Lymphocytes Relative: 40 %
Lymphs Abs: 4.7 10*3/uL — ABNORMAL HIGH (ref 0.7–4.0)
MCH: 29 pg (ref 26.0–34.0)
MCHC: 33.3 g/dL (ref 30.0–36.0)
MCV: 87.3 fL (ref 78.0–100.0)
Monocytes Absolute: 0.8 10*3/uL (ref 0.1–1.0)
Monocytes Relative: 7 %
Neutro Abs: 5.6 10*3/uL (ref 1.7–7.7)
Neutrophils Relative %: 46 %
Platelets: 257 10*3/uL (ref 150–400)
RBC: 5.34 MIL/uL (ref 4.22–5.81)
RDW: 14.4 % (ref 11.5–15.5)
WBC: 11.9 10*3/uL — ABNORMAL HIGH (ref 4.0–10.5)

## 2017-10-23 LAB — COMPREHENSIVE METABOLIC PANEL
ALT: 22 U/L (ref 17–63)
AST: 20 U/L (ref 15–41)
Albumin: 4.1 g/dL (ref 3.5–5.0)
Alkaline Phosphatase: 82 U/L (ref 38–126)
Anion gap: 10 (ref 5–15)
BUN: 12 mg/dL (ref 6–20)
CO2: 26 mmol/L (ref 22–32)
Calcium: 8.7 mg/dL — ABNORMAL LOW (ref 8.9–10.3)
Chloride: 103 mmol/L (ref 101–111)
Creatinine, Ser: 1.06 mg/dL (ref 0.61–1.24)
GFR calc Af Amer: >60 mL/min (ref >60)
GFR calc non Af Amer: >60 mL/min (ref >60)
Glucose, Bld: 122 mg/dL — ABNORMAL HIGH (ref 65–99)
Potassium: 3.8 mmol/L (ref 3.5–5.1)
Sodium: 139 mmol/L (ref 135–145)
Total Bilirubin: 0.6 mg/dL (ref 0.3–1.2)
Total Protein: 7.3 g/dL (ref 6.5–8.1)

## 2017-10-23 LAB — TSH: TSH: 2.427 u[IU]/mL (ref 0.350–4.500)

## 2017-10-23 MED ORDER — SODIUM CHLORIDE 0.9 % IV SOLN
Freq: Once | INTRAVENOUS | Status: AC
  Administered 2017-10-23: 10:00:00 via INTRAVENOUS

## 2017-10-23 MED ORDER — DEXAMETHASONE SODIUM PHOSPHATE 10 MG/ML IJ SOLN
INTRAMUSCULAR | Status: AC
  Filled 2017-10-23: qty 1

## 2017-10-23 MED ORDER — SODIUM CHLORIDE 0.9 % IV SOLN
200.0000 mg | Freq: Once | INTRAVENOUS | Status: AC
  Administered 2017-10-23: 200 mg via INTRAVENOUS
  Filled 2017-10-23: qty 8

## 2017-10-23 MED ORDER — AZITHROMYCIN 250 MG PO TABS: ORAL_TABLET | ORAL | 0 refills | Status: DC

## 2017-10-23 MED ORDER — SODIUM CHLORIDE 0.9 % IV SOLN: 10.0000 mg | Freq: Once | INTRAVENOUS | Status: DC

## 2017-10-23 MED ORDER — HEPARIN SOD (PORK) LOCK FLUSH 100 UNIT/ML IV SOLN
500.0000 [IU] | Freq: Once | INTRAVENOUS | Status: AC | PRN
  Administered 2017-10-23: 500 [IU]

## 2017-10-23 MED ORDER — SODIUM CHLORIDE 0.9% FLUSH
10.0000 mL | INTRAVENOUS | Status: DC | PRN
  Administered 2017-10-23: 10 mL
  Filled 2017-10-23: qty 10

## 2017-10-23 MED ORDER — DIPHENHYDRAMINE HCL 50 MG/ML IJ SOLN
25.0000 mg | Freq: Once | INTRAMUSCULAR | Status: AC
  Administered 2017-10-23: 25 mg via INTRAVENOUS

## 2017-10-23 MED ORDER — DIPHENHYDRAMINE HCL 50 MG/ML IJ SOLN
INTRAMUSCULAR | Status: AC
  Filled 2017-10-23: qty 1

## 2017-10-23 MED ORDER — DEXAMETHASONE SODIUM PHOSPHATE 10 MG/ML IJ SOLN
10.0000 mg | Freq: Once | INTRAMUSCULAR | Status: AC
  Administered 2017-10-23: 10 mg via INTRAVENOUS

## 2017-10-23 NOTE — Progress Notes (Signed)
Patient seen today by Dr. Walden Field, labs reviewed and proceed with treatment.  Treatment given per orders. Patient tolerated it well without problems. Vitals stable and discharged home from clinic ambulatory. Follow up as scheduled.

## 2017-10-23 NOTE — Patient Instructions (Signed)
[facility] Discharge Instructions for Patients Receiving Chemotherapy   Beginning January 23rd 2017 lab work for the Cancer Center will be done in the  Main lab at  on 1st floor. If you have a lab appointment with the Cancer Center please come in thru the  Main Entrance and check in at the main information desk   Today you received the following chemotherapy agents   To help prevent nausea and vomiting after your treatment, we encourage you to take your nausea medication     If you develop nausea and vomiting, or diarrhea that is not controlled by your medication, call the clinic.  The clinic phone number is [phone]. Office hours are Monday-Friday 8:30am-5:00pm.  BELOW ARE SYMPTOMS THAT SHOULD BE REPORTED IMMEDIATELY:  *FEVER GREATER THAN 101.0 F  *CHILLS WITH OR WITHOUT FEVER  NAUSEA AND VOMITING THAT IS NOT CONTROLLED WITH YOUR NAUSEA MEDICATION  *UNUSUAL SHORTNESS OF BREATH  *UNUSUAL BRUISING OR BLEEDING  TENDERNESS IN MOUTH AND THROAT WITH OR WITHOUT PRESENCE OF ULCERS  *URINARY PROBLEMS  *BOWEL PROBLEMS  UNUSUAL RASH Items with * indicate a potential emergency and should be followed up as soon as possible. If you have an emergency after office hours please contact your primary care physician or go to the nearest emergency department.  Please call the clinic during office hours if you have any questions or concerns.   You may also contact the Patient Navigator at [phone] should you have any questions or need assistance in obtaining follow up care.      Resources For Cancer Patients and their Caregivers ? American Cancer Society: Can assist with transportation, wigs, general needs, runs Look Good Feel Better.        1-888-227-6333 ? Cancer Care: Provides financial assistance, online support groups, medication/co-pay assistance.  1-800-813-HOPE (4673) ? [facility] Assists [place] cancer  patients and their families through emotional , educational and financial support.  336-427-4357 ? [place] DSS Where to apply for food stamps, Medicaid and utility assistance. 336-342-1394 ? [facility]: Transportation to medical appointments. 336-347-2287 ? Social Security Administration: May apply for disability if have a Stage IV cancer. 336-342-7796 1-800-772-1213 ? [place] Aging, Disability and Transit Services: Assists with nutrition, care and transit needs. 336-349-2343         

## 2017-10-23 NOTE — Progress Notes (Signed)
Diagnosis Primary cancer of right upper lobe of lung ([place]) - Plan: CBC with Differential/Platelet, Comprehensive metabolic panel, Lactate dehydrogenase, azithromycin (ZITHROMAX Z-PAK) 250 MG tablet  Cough - Plan: azithromycin (ZITHROMAX Z-PAK) 250 MG tablet  Staging Cancer Staging Primary cancer of right upper lobe of lung ([place]) Staging form: Lung, AJCC 8th Edition - Clinical: Stage IVB (cT3, cN2, cM1c) - Signed by Baird Cancer, PA-C on 05/07/2016   Assessment and Plan:  1.  Stage 4 adenocarcinoma of lung.  Patient is currently being treated with pembrolizumab.  He is being seen today for evaluation and is usually followed by Dr.  Worthy Keeler.  He is here to go over CT CAP done 10/13/2017. Scans reviewed with pt and has also been reviewed by Dr. Worthy Keeler and shows  IMPRESSION: 1. Stable appearing right upper lobe lung mass and stable mediastinal adenopathy. 2. Stable pulmonary nodules. 3. Stable small soft tissue nodules in the sigmoid mesocolon. 4. Possible asymmetric left-sided wall thickening at the rectosigmoid junction. 5. No findings for hepatic metastatic disease. 6. Stable subcutaneous nodule involving the right breast area. 7. Stable left adrenal gland lesion.  He will proceed with [facility] and will continue to follow-up with Dr. Worthy Keeler. Scans and Labs reviewed with pt and daughter.    2.  Itching.  He continues to complain that none of the medications he has been prescribed by Dr. Worthy Keeler are working.  He will be given Decadron 10 mg IV and Benadryl 25 mg IV in clinic today.  He is advised to try Aveeno and keep skin moisturized.  He is referred to Dermatology.  He will follow-up with Dr. Worthy Keeler if no improvement in symptoms.    3.  Cough.  When questioned, he is using Vapes.  He is Rx Zpac as directed and should notify the clinic if no improvement in symptoms.  CT of chest done 10/2017 showed stable findings.  Pulse ox 97% on room air.    4.  Proximal sigmoid  colon nodules.  This was noted on CT scan done 07/09/2017.  He  referred to Dr. Laural Golden for evaluation.  He had colonoscopy done 07/28/2017 that showed  - Submucosal 12 mm firm lesion in the distal sigmoid colon. - External and internal hemorrhoids. - No specimens collected.  Scan is again showing wall thickening in sigmoid colon.  Will defer to Dr. Worthy Keeler if he desires additional surgical work-up due to advanced lung cancer and difficulty with compliance in this pt.  Pt had previously never undergone colonoscopy prior to me making referral and was initially resistant to seeing GI.    5.  Hypertension.  BP is 152/86.  Follow-up with PCP.     Interval history: 72 y.o. male with diagnosis of leiomyosarcoma of the right chest wall as well as a stage IV adenocarcinoma of the lung, currently undergoing palliative systemic immunotherapy with pembrolizumab.    Current Status: Patient seen today  for follow-up.  He is here to go over CT scans.  He is here for evaluation prior to [facility].       Primary cancer of right upper lobe of lung ([place])   05/14/2015 Procedure    Port placement by Dr. Arnoldo Morale      04/01/2016 Initial Diagnosis    Primary cancer of right upper lobe of lung ([place])      04/02/2016 Procedure    Ultrasound-guided core biopsy performed of a solid 1.5 cm soft tissue nodule in the right chest wall.      04/03/2016  Imaging    MRI brain- No acute and intracranial process or metastasis.  Moderate chronic small vessel ischemic disease.      04/04/2016 Pathology Results    Diagnosis Soft Tissue Needle Core Biopsy, Right Chest Wall SMOOTH MUSCLE NEOPLASM Microscopic Comment The differential diagnosis include low grade leiomyosarcoma. The neoplasm shows spindle cell morphology with rare mitotic figures and minimal atypia, no necrosis present. These cells stains positive for smooth muscle actin, desmin, negative for cytokeratin AE1&3, ck8/18, s100, cd117, cd 34 and cd99. This  case also reviewed by Dr. [name] and agree.      04/05/2016 Procedure    CT-guided biopsy of right upper lobe lesion, with tissue specimen sent to pathology, by IR.      04/09/2016 Pathology Results    Lung, needle/core biopsy(ies), Right Upper Lobe - NON SMALL CELL CARCINOMA.      04/17/2016 Pathology Results    PDL1 High Expression.  TPS 50%.      04/24/2016 PET scan    1. Prominently hypermetabolic large right upper lobe mass with metastatic disease to the right paratracheal and right hilar nodal chains, to the descending mesocolon, to the left upper buttock subcutaneous tissues, to the left upper lobe, to the right subscapularis muscle, and possibly to the left adrenal gland and right breast subcutaneous tissues. Appearance compatible with stage IV lung cancer. 2. There is some focal high activity in the ascending colon which is probably from peristalsis, less likely from a local colon mass. This does raise the possibility that the adjacent mesocolon tumor implant could be due to synchronous colon cancer rather than lung metastasis. 3. Other imaging findings of potential clinical significance: Coronary, aortic arch, and branch vessel atherosclerotic vascular disease. Aortoiliac atherosclerotic vascular disease. Deformity left proximal humerus and left glenohumeral joint from prior trauma. Enlarged prostate gland.      04/26/2016 Pathology Results    FoundationONE: Genomic alterations identified- KRAS O27X, CREBBP splice site [id number], LRP1B S515*, VEH20 N47*, SJG28 splice site [id number], SPTA1 splice site [id number], TP53 C135Y.  Additional findings- MS-Stable, TMB-intermediate (14 Muts/Mb).  No reportable alterations- EGFR, ALK, BRAF, MET, ERBB2, RET, ROS1.      05/17/2016 -  Chemotherapy    The patient had ONDANSETRON IVPB [facility] +/- DEXAMETHASONE, , Intravenous,  Once, 0 of 1 cycle  pembrolizumab (KEYTRUDA) 200 mg in sodium chloride 0.9 % 50 mL chemo infusion,  200 mg, Intravenous, Once, 0 of 5 cycles  for chemotherapy treatment.        08/06/2016 PET scan    IMPRESSION: 1. Overall considerable improvement. The right upper lobe mass is markedly reduced in volume and SUV. Thoracic adenopathy have resolved and the hypermetabolic lesion in the right subscapularis muscle has also resolved. 2. The left upper lobe nodule is no longer appreciably hypermetabolic, and is highly indistinct although this may be due to motion artifact. Faint in indistinct nodularity elsewhere in the lungs likewise not currently hypermetabolic. 3. There is some very faint residual low-grade activity along the subcutaneous deposit along the upper left buttock region. Marked reduction in size and activity of the tumor deposit adjacent to the descending colon. 4. Prior right pleural effusion has resolved. 5. Stable appearance of the adrenal glands, with low-level activity and a nodule in the left adrenal gland. 6. Stable small nodule in the right breast, low-grade metabolic activity with maximum SUV 2.2 (formerly 3.0) 7. Other imaging findings of potential clinical significance: Severe arthropathy of the left glenohumeral joint. Coronary, aortic arch, and branch vessel atherosclerotic  vascular disease. Aortoiliac atherosclerotic vascular disease. Prominent prostate gland indents the bladder base.        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  Two adjacent large masslike areas of consolidation within the right upper lobe with differential considerations including malignancy or pneumonia. Multiple enlarged right hilar and mediastinal lymph nodes which may be reactive or metastatic in etiology.  Additional indeterminate pulmonary nodules as above. Recommend attention on follow-up.  Indeterminate 2.9 cm left adrenal nodule. This  needs dedicated evaluation with pre and post contrast-enhanced CT or MRI after confirmation of pulmonary process.  Indeterminate nodule within the right breast. Recommend dedicated evaluation with mammography.  Hepatic steatosis.      04/02/2016 Procedure    Ultrasound-guided core biopsy performed of a solid 1.5 cm soft tissue nodule in the right chest wall.      04/04/2016 Pathology Results    Diagnosis Soft Tissue Needle Core Biopsy, Right Chest Wall SMOOTH MUSCLE NEOPLASM Microscopic Comment The differential diagnosis include low grade leiomyosarcoma. The neoplasm shows spindle cell morphology with rare mitotic figures and minimal atypia, no necrosis present. These cells stains positive for smooth muscle actin, desmin, negative for cytokeratin AE1&3, ck8/18, s100, cd117, cd 34 and cd99. This case also reviewed by Dr. [name] and agree.         Problem List Patient Active Problem List   Diagnosis Date Noted  . Abnormal CT of the abdomen [R93.5] 07/22/2017  . Itching [L29.9] 10/10/2016  . Diabetes mellitus ([place]) [E11.9] 05/02/2016  . 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 Primary cancer of right upper lobe of lung ([place]) [C34.11] 04/01/2016  . Breast nodule [N63.0] 04/01/2016  . Adrenal mass, left ([place]) [E27.9] 04/01/2016  . HTN (hypertension), benign [I10] 04/01/2016  . Pulmonary embolus ([place]) [I26.99] 03/31/2016    Past Medical History Past Medical History:  Diagnosis Date  . Adrenal mass, left ([place]) 04/01/2016  . Diabetes mellitus ([place]) 05/02/2016  . Diabetes mellitus without complication ([place])   . Hypertension   . Mass of breast, right 04/01/2016  . Mass of upper lobe of right lung 04/01/2016   2 adjacent, 5 cm masses, hilar adenopathy  . Primary cancer of right upper lobe of lung ([place]) 04/01/2016   2 adjacent, 5 cm masses, hilar adenopathy  . Pulmonary embolus ([place]) 03/31/2016  . Spindle cell sarcoma [facility])     Past Surgical History Past Surgical  History:  Procedure Laterality Date  . COLONOSCOPY N/A 07/28/2017   Procedure: COLONOSCOPY;  Surgeon: Rogene Houston, MD;  Location: [facility] ENDO SUITE;  Service: Endoscopy;  Laterality: N/A;  205  . PORTACATH PLACEMENT Left 05/13/2016   Procedure: INSERTION PORT-A-CATH;  Surgeon: Aviva Signs, MD;  Location: [facility] ORS;  Service: General;  Laterality: Left;    Family History Family History  Problem Relation Age of Onset  . Stroke Mother   . Heart attack Father   . Heart attack Brother      Social History  reports that he has been smoking e-cigarettes.  He has smoked for the past 61.00 years. He has never used smokeless tobacco. He reports that he does not drink alcohol or use drugs.  Medications  Current Outpatient Medications:  .  ALPRAZolam (XANAX) 1 MG tablet, Take 1 mg by mouth 3 (three) times daily as needed for anxiety., Disp: , Rfl:  .  amLODipine (NORVASC) 2.5  MG tablet, Take 2.5 mg by mouth daily., Disp: , Rfl: 4 .  diphenoxylate-atropine (LOMOTIL) 2.5-0.025 MG tablet, Take 1 tablet 4 (four) times daily as needed by mouth for diarrhea or loose stools., Disp: 60 tablet, Rfl: 0 .  furosemide (LASIX) 20 MG tablet, Take 2 tablets (40 mg total) by mouth daily., Disp: 20 tablet, Rfl: 0 .  GLIPIZIDE XL 5 MG 24 hr tablet, Take 5 mg by mouth daily., Disp: , Rfl: 4 .  guaiFENesin (MUCINEX) 600 MG 12 hr tablet, Take 600 mg by mouth 2 (two) times daily., Disp: , Rfl:  .  hydrocortisone cream 0.5 %, Apply 1 application topically 2 (two) times daily., Disp: 30 g, Rfl: 0 .  hydrOXYzine (ATARAX/VISTARIL) 25 MG tablet, Take 1 tablet (25 mg total) by mouth 3 (three) times daily as needed., Disp: 45 tablet, Rfl: 3 .  lidocaine-prilocaine (EMLA) cream, Apply a quarter size amount to affected area 1 hour prior to coming to chemotherapy. Cover with plastic wrap., Disp: 30 g, Rfl: 2 .  metFORMIN (GLUCOPHAGE) 500 MG tablet, Take 500 mg by mouth 2 (two) times daily., Disp: , Rfl: 4 .  metoprolol succinate  (TOPROL-XL) 25 MG 24 hr tablet, TAKE ONE TABLET BY MOUTH IN THE EVENING., Disp: 30 tablet, Rfl: 0 .  nystatin (MYCOSTATIN) 100000 UNIT/ML suspension, Take 5 mLs (500,000 Units total) by mouth 4 (four) times daily., Disp: 60 mL, Rfl: 0 .  ondansetron (ZOFRAN) 8 MG tablet, Take 8 mg by mouth every 8 (eight) hours as needed for nausea or vomiting., Disp: , Rfl:  .  oxyCODONE-acetaminophen (PERCOCET) 10-325 MG tablet, , Disp: , Rfl: 0 .  Pembrolizumab (KEYTRUDA IV), Inject into the vein., Disp: , Rfl:  .  prochlorperazine (COMPAZINE) 10 MG tablet, , Disp: , Rfl: 1 .  simvastatin (ZOCOR) 20 MG tablet, Take 20 mg by mouth every evening., Disp: , Rfl: 5 .  Skin Protectants, Misc. (EUCERIN) cream, Apply topically as needed for dry skin., Disp: 454 g, Rfl: 1 .  spironolactone (ALDACTONE) 50 MG tablet, Take 1 tablet (50 mg total) by mouth daily., Disp: 10 tablet, Rfl: 0 .  triamcinolone cream (KENALOG) 0.1 %, Apply 1 application topically 2 (two) times daily., Disp: , Rfl:  .  azithromycin (ZITHROMAX Z-PAK) 250 MG tablet, Take 2 tabs on day 1 the one tab daily until all gone, Disp: 6 each, Rfl: 0  Allergies Patient has no known allergies.  Review of Systems Review of Systems - Oncology ROS negative other than cough and skin itching   Physical Exam  Vitals Wt Readings from Last 3 Encounters:  10/23/17 233 lb 8 oz (105.9 kg)  10/02/17 231 lb 6.4 oz (105 kg)  09/11/17 234 lb 12.8 oz (106.5 kg)   Temp Readings from Last 3 Encounters:  10/23/17 (!) 97.2 F (36.2 C) (Tympanic)  10/23/17 (!) 97.4 F (36.3 C) (Oral)  10/02/17 97.6 F (36.4 C) (Oral)   BP Readings from Last 3 Encounters:  10/23/17 (!) 153/89  10/23/17 (!) 152/86  10/02/17 111/89   Pulse Readings from Last 3 Encounters:  10/23/17 60  10/23/17 88  10/02/17 71   Constitutional: Well-developed, well-nourished, and in no distress.   HENT: Head: Normocephalic and atraumatic.  Mouth/Throat: No oropharyngeal exudate. Mucosa  moist. Eyes: Pupils are equal, round, and reactive to light. Conjunctivae are normal. No scleral icterus.  Neck: Normal range of motion. Neck supple. No JVD present.  Cardiovascular: Normal rate, regular rhythm and normal heart sounds.  Exam reveals  no gallop and no friction rub.   No murmur heard. Pulmonary/Chest: Effort normal.  Coarse BS.   Abdominal: Soft. Bowel sounds are normal. No distension. There is no tenderness. There is no guarding.  Musculoskeletal: No edema or tenderness.  Lymphadenopathy: No cervical, axillary or supraclavicular adenopathy.  Neurological: Alert and oriented to person, place, and time. No cranial nerve deficit.  Skin: Skin is warm and dry. Mild erythema.  Chronic solar changes and skin dryness noted.   Psychiatric: Affect and judgment normal.   Labs Appointment on 10/23/2017  Component Date Value Ref Range Status  . WBC 10/23/2017 11.9* 4.0 - 10.5 K/uL Final  . RBC 10/23/2017 5.34  4.22 - 5.81 MIL/uL Final  . Hemoglobin 10/23/2017 15.5  13.0 - 17.0 g/dL Final  . HCT 10/23/2017 46.6  39.0 - 52.0 % Final  . MCV 10/23/2017 87.3  78.0 - 100.0 fL Final  . MCH 10/23/2017 29.0  26.0 - 34.0 pg Final  . MCHC 10/23/2017 33.3  30.0 - 36.0 g/dL Final  . RDW 10/23/2017 14.4  11.5 - 15.5 % Final  . Platelets 10/23/2017 257  150 - 400 K/uL Final  . Neutrophils Relative % 10/23/2017 46  % Final  . Neutro Abs 10/23/2017 5.6  1.7 - 7.7 K/uL Final  . Lymphocytes Relative 10/23/2017 40  % Final  . Lymphs Abs 10/23/2017 4.7* 0.7 - 4.0 K/uL Final  . Monocytes Relative 10/23/2017 7  % Final  . Monocytes Absolute 10/23/2017 0.8  0.1 - 1.0 K/uL Final  . Eosinophils Relative 10/23/2017 6  % Final  . Eosinophils Absolute 10/23/2017 0.7  0.0 - 0.7 K/uL Final  . Basophils Relative 10/23/2017 1  % Final  . Basophils Absolute 10/23/2017 0.1  0.0 - 0.1 K/uL Final   Performed at [facility], [address], [place], [place] [address]  . Sodium 10/23/2017 139  135 - 145 mmol/L Final   . Potassium 10/23/2017 3.8  3.5 - 5.1 mmol/L Final  . Chloride 10/23/2017 103  101 - 111 mmol/L Final  . CO2 10/23/2017 26  22 - 32 mmol/L Final  . Glucose, Bld 10/23/2017 122* 65 - 99 mg/dL Final  . BUN 10/23/2017 12  6 - 20 mg/dL Final  . Creatinine, Ser 10/23/2017 1.06  0.61 - 1.24 mg/dL Final  . Calcium 10/23/2017 8.7* 8.9 - 10.3 mg/dL Final  . Total Protein 10/23/2017 7.3  6.5 - 8.1 g/dL Final  . Albumin 10/23/2017 4.1  3.5 - 5.0 g/dL Final  . AST 10/23/2017 20  15 - 41 U/L Final  . ALT 10/23/2017 22  17 - 63 U/L Final  . Alkaline Phosphatase 10/23/2017 82  38 - 126 U/L Final  . Total Bilirubin 10/23/2017 0.6  0.3 - 1.2 mg/dL Final  . GFR calc non Af Amer 10/23/2017 >60  >60 mL/min Final  . GFR calc Af Amer 10/23/2017 >60  >60 mL/min Final   Comment: (NOTE) The eGFR has been calculated using the CKD EPI equation. This calculation has not been validated in all clinical situations. eGFR's persistently <60 mL/min signify possible Chronic Kidney Disease.   Georgiann Hahn gap 10/23/2017 10  5 - 15 Final   Performed at [facility], [address], [place], [place] [address]  . TSH 10/23/2017 2.427  0.350 - 4.500 uIU/mL Final   Comment: Performed by a 3rd Generation assay with a functional sensitivity of <=0.01 uIU/mL. Performed at [facility], [address], [place], [place] [address]  Pathology Orders Placed This Encounter  Procedures  . CBC with Differential/Platelet    Standing Status:   Future    Standing Expiration Date:   10/24/2018  . Comprehensive metabolic panel    Standing Status:   Future    Standing Expiration Date:   10/24/2018  . Lactate dehydrogenase    Standing Status:   Future    Standing Expiration Date:   10/24/2018       Zoila Shutter MD

## 2017-10-23 NOTE — Progress Notes (Signed)
Per Dr. Walden Field request, a Z-pak was sent to patients pharmacy.

## 2017-11-13 ENCOUNTER — Encounter (HOSPITAL_COMMUNITY): Payer: Self-pay

## 2017-11-13 ENCOUNTER — Inpatient Hospital Stay (HOSPITAL_COMMUNITY): Payer: Medicare Other

## 2017-11-13 ENCOUNTER — Inpatient Hospital Stay (HOSPITAL_COMMUNITY): Payer: Medicare Other | Attending: Hematology

## 2017-11-13 ENCOUNTER — Encounter (HOSPITAL_COMMUNITY): Payer: Self-pay | Admitting: Hematology

## 2017-11-13 ENCOUNTER — Other Ambulatory Visit: Payer: Self-pay

## 2017-11-13 ENCOUNTER — Inpatient Hospital Stay (HOSPITAL_COMMUNITY): Payer: Medicare Other | Admitting: Hematology

## 2017-11-13 VITALS — BP 154/92 | HR 85 | Temp 97.6°F | Resp 18 | Wt 236.6 lb

## 2017-11-13 DIAGNOSIS — R197 Diarrhea, unspecified: Secondary | ICD-10-CM | POA: Diagnosis not present

## 2017-11-13 DIAGNOSIS — C3411 Malignant neoplasm of upper lobe, right bronchus or lung: Secondary | ICD-10-CM | POA: Insufficient documentation

## 2017-11-13 DIAGNOSIS — Z5112 Encounter for antineoplastic immunotherapy: Secondary | ICD-10-CM | POA: Insufficient documentation

## 2017-11-13 DIAGNOSIS — Z9221 Personal history of antineoplastic chemotherapy: Secondary | ICD-10-CM | POA: Insufficient documentation

## 2017-11-13 DIAGNOSIS — I10 Essential (primary) hypertension: Secondary | ICD-10-CM | POA: Diagnosis not present

## 2017-11-13 DIAGNOSIS — R590 Localized enlarged lymph nodes: Secondary | ICD-10-CM

## 2017-11-13 DIAGNOSIS — Z7984 Long term (current) use of oral hypoglycemic drugs: Secondary | ICD-10-CM | POA: Diagnosis not present

## 2017-11-13 DIAGNOSIS — E119 Type 2 diabetes mellitus without complications: Secondary | ICD-10-CM | POA: Diagnosis not present

## 2017-11-13 DIAGNOSIS — Z79899 Other long term (current) drug therapy: Secondary | ICD-10-CM | POA: Diagnosis not present

## 2017-11-13 DIAGNOSIS — Z86711 Personal history of pulmonary embolism: Secondary | ICD-10-CM | POA: Diagnosis not present

## 2017-11-13 DIAGNOSIS — L299 Pruritus, unspecified: Secondary | ICD-10-CM | POA: Insufficient documentation

## 2017-11-13 DIAGNOSIS — K760 Fatty (change of) liver, not elsewhere classified: Secondary | ICD-10-CM | POA: Diagnosis not present

## 2017-11-13 DIAGNOSIS — C499 Malignant neoplasm of connective and soft tissue, unspecified: Secondary | ICD-10-CM

## 2017-11-13 LAB — CBC WITH DIFFERENTIAL/PLATELET
Basophils Absolute: 0.1 10*3/uL (ref 0.0–0.1)
Basophils Relative: 1 %
Eosinophils Absolute: 0.8 10*3/uL — ABNORMAL HIGH (ref 0.0–0.7)
Eosinophils Relative: 6 %
HCT: 45.7 % (ref 39.0–52.0)
Hemoglobin: 15.4 g/dL (ref 13.0–17.0)
Lymphocytes Relative: 37 %
Lymphs Abs: 4.4 10*3/uL — ABNORMAL HIGH (ref 0.7–4.0)
MCH: 29.5 pg (ref 26.0–34.0)
MCHC: 33.7 g/dL (ref 30.0–36.0)
MCV: 87.5 fL (ref 78.0–100.0)
Monocytes Absolute: 1 10*3/uL (ref 0.1–1.0)
Monocytes Relative: 9 %
Neutro Abs: 5.8 10*3/uL (ref 1.7–7.7)
Neutrophils Relative %: 47 %
Platelets: 272 10*3/uL (ref 150–400)
RBC: 5.22 MIL/uL (ref 4.22–5.81)
RDW: 14.3 % (ref 11.5–15.5)
WBC: 12 10*3/uL — ABNORMAL HIGH (ref 4.0–10.5)

## 2017-11-13 LAB — COMPREHENSIVE METABOLIC PANEL
ALT: 24 U/L (ref 0–44)
AST: 20 U/L (ref 15–41)
Albumin: 4.1 g/dL (ref 3.5–5.0)
Alkaline Phosphatase: 84 U/L (ref 38–126)
Anion gap: 8 (ref 5–15)
BUN: 12 mg/dL (ref 8–23)
CO2: 27 mmol/L (ref 22–32)
Calcium: 8.9 mg/dL (ref 8.9–10.3)
Chloride: 106 mmol/L (ref 98–111)
Creatinine, Ser: 1.02 mg/dL (ref 0.61–1.24)
GFR calc Af Amer: >60 mL/min (ref >60)
GFR calc non Af Amer: >60 mL/min (ref >60)
Glucose, Bld: 101 mg/dL — ABNORMAL HIGH (ref 70–99)
Potassium: 4.1 mmol/L (ref 3.5–5.1)
Sodium: 141 mmol/L (ref 135–145)
Total Bilirubin: 0.5 mg/dL (ref 0.3–1.2)
Total Protein: 7.3 g/dL (ref 6.5–8.1)

## 2017-11-13 LAB — TSH: TSH: 1.95 u[IU]/mL (ref 0.350–4.500)

## 2017-11-13 MED ORDER — SODIUM CHLORIDE 0.9 % IV SOLN
Freq: Once | INTRAVENOUS | Status: AC
  Administered 2017-11-13: 10:00:00 via INTRAVENOUS

## 2017-11-13 MED ORDER — HEPARIN SOD (PORK) LOCK FLUSH 100 UNIT/ML IV SOLN
500.0000 [IU] | Freq: Once | INTRAVENOUS | Status: AC | PRN
  Administered 2017-11-13: 500 [IU]

## 2017-11-13 MED ORDER — SODIUM CHLORIDE 0.9 % IV SOLN
200.0000 mg | Freq: Once | INTRAVENOUS | Status: AC
  Administered 2017-11-13: 200 mg via INTRAVENOUS
  Filled 2017-11-13: qty 8

## 2017-11-13 NOTE — Assessment & Plan Note (Signed)
1.  Stage IV adenocarcinoma of the lung, PDL 1-50%, other actionable mutations negative: -Metastatic disease in the right upper lobe, mediastinal and hilar adenopathy, descending mesocolon, left upper buttock subcutaneous tissue, left upper lobe, right subscapularis muscle, left adrenal gland, right breast subcutaneous tissue - Keytruda started on 05/17/2016,  CT scan on 07/09/2017 showing decrease in size of lung nodules, 2 nodules in the serosa of the sigmoid colon, status post colonoscopy which showed submucosal 12 mm firm lesion in the sigmoid colon, no biopsies done. - He has mild diarrhea which is controlled with antidiarrheal medicine.  It usually lasts 2 to 3 days after treatment.  CT scan of the chest, abdomen and pelvis on 10/13/2017 showed stable disease.  His blood counts are adequate to proceed with his next cycle of Keytruda.  I will see him back on September 12 and will repeat a scan prior to his visit.  2.  Itching: Since he started pembrolizumab, he is having itching on his upper trunk, predominantly on the back.  He tried doxepin cream which made it worse.  He has tried Atarax and topical steroid creams.  Nothing is helping him.  We will make a referral to dermatology.

## 2017-11-13 NOTE — Progress Notes (Signed)
Tolerated infusion w/o adverse reaction.  Alert, in no distress.  VSS.  Discharged ambulatory in c/o daughter.  

## 2017-11-13 NOTE — Progress Notes (Signed)
[place],  [place] [address]   CLINIC:  Medical Oncology/Hematology  PCP:  Celene Squibb, MD [place] [address] [phone]   REASON FOR VISIT:  Follow-up for stage IV adenocarcinoma of the right lung  CURRENT THERAPY: keytruda  BRIEF ONCOLOGIC HISTORY:    Primary cancer of right upper lobe of lung ([facility])   05/14/2015 Procedure    Port placement by Dr. Arnoldo Morale      04/01/2016 Initial Diagnosis    Primary cancer of right upper lobe of lung ([facility])      04/02/2016 Procedure    Ultrasound-guided core biopsy performed of a solid 1.5 cm soft tissue nodule in the right chest wall.      04/03/2016 Imaging    MRI brain- No acute and intracranial process or metastasis.  Moderate chronic small vessel ischemic disease.      04/04/2016 Pathology Results    Diagnosis Soft Tissue Needle Core Biopsy, Right Chest Wall SMOOTH MUSCLE NEOPLASM Microscopic Comment The differential diagnosis include low grade leiomyosarcoma. The neoplasm shows spindle cell morphology with rare mitotic figures and minimal atypia, no necrosis present. These cells stains positive for smooth muscle actin, desmin, negative for cytokeratin AE1&3, ck8/18, s100, cd117, cd 34 and cd99. This case also reviewed by Dr. [name] and agree.      04/05/2016 Procedure    CT-guided biopsy of right upper lobe lesion, with tissue specimen sent to pathology, by IR.      04/09/2016 Pathology Results    Lung, needle/core biopsy(ies), Right Upper Lobe - NON SMALL CELL CARCINOMA.      04/17/2016 Pathology Results    PDL1 High Expression.  TPS 50%.      04/24/2016 PET scan    1. Prominently hypermetabolic large right upper lobe mass with metastatic disease to the right paratracheal and right hilar nodal chains, to the descending mesocolon, to the left upper buttock subcutaneous tissues, to the left upper lobe, to the right subscapularis muscle, and possibly  to the left adrenal gland and right breast subcutaneous tissues. Appearance compatible with stage IV lung cancer. 2. There is some focal high activity in the ascending colon which is probably from peristalsis, less likely from a local colon mass. This does raise the possibility that the adjacent mesocolon tumor implant could be due to synchronous colon cancer rather than lung metastasis. 3. Other imaging findings of potential clinical significance: Coronary, aortic arch, and branch vessel atherosclerotic vascular disease. Aortoiliac atherosclerotic vascular disease. Deformity left proximal humerus and left glenohumeral joint from prior trauma. Enlarged prostate gland.      04/26/2016 Pathology Results    FoundationONE: Genomic alterations identified- KRAS K93G, CREBBP splice site [id number], LRP1B S515*, JIR67 E93*, YBO17 splice site [id number], SPTA1 splice site [id number], TP53 C135Y.  Additional findings- MS-Stable, TMB-intermediate (14 Muts/Mb).  No reportable alterations- EGFR, ALK, BRAF, MET, ERBB2, RET, ROS1.      05/17/2016 -  Chemotherapy    The patient had ONDANSETRON IVPB [facility] +/- DEXAMETHASONE, , Intravenous,  Once, 0 of 1 cycle  pembrolizumab (KEYTRUDA) 200 mg in sodium chloride 0.9 % 50 mL chemo infusion, 200 mg, Intravenous, Once, 0 of 5 cycles  for chemotherapy treatment.        08/06/2016 PET scan    IMPRESSION: 1. Overall considerable improvement. The right upper lobe mass is markedly reduced in volume and SUV. Thoracic adenopathy have resolved and the hypermetabolic lesion in the right subscapularis muscle has also resolved.  2. The left upper lobe nodule is no longer appreciably hypermetabolic, and is highly indistinct although this may be due to motion artifact. Faint in indistinct nodularity elsewhere in the lungs likewise not currently hypermetabolic. 3. There is some very faint residual low-grade activity along the subcutaneous deposit along the upper  left buttock region. Marked reduction in size and activity of the tumor deposit adjacent to the descending colon. 4. Prior right pleural effusion has resolved. 5. Stable appearance of the adrenal glands, with low-level activity and a nodule in the left adrenal gland. 6. Stable small nodule in the right breast, low-grade metabolic activity with maximum SUV 2.2 (formerly 3.0) 7. Other imaging findings of potential clinical significance: Severe arthropathy of the left glenohumeral joint. Coronary, aortic arch, and branch vessel atherosclerotic vascular disease. Aortoiliac atherosclerotic vascular disease. Prominent prostate gland indents the bladder base.        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  Two adjacent large masslike areas of consolidation within the right upper lobe with differential considerations including malignancy or pneumonia. Multiple enlarged right hilar and mediastinal lymph nodes which may be reactive or metastatic in etiology.  Additional indeterminate pulmonary nodules as above. Recommend attention on follow-up.  Indeterminate 2.9 cm left adrenal nodule. This needs dedicated evaluation with pre and post contrast-enhanced CT or MRI after confirmation of pulmonary process.  Indeterminate nodule within the right breast. Recommend dedicated evaluation with mammography.  Hepatic steatosis.      04/02/2016 Procedure    Ultrasound-guided core biopsy performed of a solid 1.5 cm soft tissue nodule in the right chest wall.      04/04/2016 Pathology Results    Diagnosis Soft Tissue Needle Core Biopsy, Right Chest Wall SMOOTH MUSCLE NEOPLASM Microscopic Comment The differential diagnosis include low grade leiomyosarcoma. The neoplasm shows spindle cell morphology with rare mitotic figures and minimal atypia, no  necrosis present. These cells stains positive for smooth muscle actin, desmin, negative for cytokeratin AE1&3, ck8/18, s100, cd117, cd 34 and cd99. This case also reviewed by Dr. [name] and agree.         CANCER STAGING: Cancer Staging Primary cancer of right upper lobe of lung ([facility]) Staging form: Lung, AJCC 8th Edition - Clinical: Stage IVB (cT3, cN2, cM1c) - Signed by Baird Cancer, PA-C on 05/07/2016    INTERVAL HISTORY:  Darrell Christensen 72 y.o. male returns for routine follow-up for stage IV lung cancer and consideration for next cycle of immunotherapy. Patient is here today with his daughter. Patient is having diarrhea a couple days after his treatment but resolves with the imodium. Patient is still itching and picking at his skin. He did not go to see the Dermotologist and wants another referral for that. Patient appetite is 50% and his energy levels are 25%. He still able to do all his own ADLs and he lives alone however they take him a little longer. Overall, he feels ready for next cycle of immunotherapy today.     REVIEW OF SYSTEMS:  Review of Systems  Constitutional: Positive for fatigue.  HENT:  Negative.   Eyes: Negative.   Respiratory: Negative.   Cardiovascular: Negative.   Gastrointestinal: Positive for diarrhea.  Endocrine: Negative.   Genitourinary: Negative.    Skin: Positive for itching.  Neurological: Negative.   Hematological: Negative.   Psychiatric/Behavioral: Negative.      PAST MEDICAL/SURGICAL HISTORY:  Past Medical History:  Diagnosis Date  . Adrenal mass, left ([facility]) 04/01/2016  . Diabetes mellitus ([facility]) 05/02/2016  . Diabetes mellitus without complication ([facility])   . Hypertension   . Mass of breast, right 04/01/2016  . Mass of upper lobe of right lung 04/01/2016   2 adjacent, 5 cm masses, hilar adenopathy  . Primary cancer of right upper lobe of lung ([facility]) 04/01/2016   2 adjacent, 5 cm masses, hilar adenopathy  . Pulmonary embolus ([facility])  03/31/2016  . 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 COLONOSCOPY N/A 07/28/2017   Procedure: COLONOSCOPY;  Surgeon: Rogene Houston, MD;  Location: [facility] ENDO SUITE;  Service: Endoscopy;  Laterality: N/A;  205  . PORTACATH PLACEMENT Left 05/13/2016   Procedure: INSERTION PORT-A-CATH;  Surgeon: Aviva Signs, MD;  Location: [facility] ORS;  Service: General;  Laterality: Left;     SOCIAL HISTORY:  Social History   Socioeconomic History  . Marital status: Divorced    Spouse name: Not on file  . Number of children: Not on file  . Years of education: Not on file  . Highest education level: Not on file  Occupational History  . Not on file  Social Needs  . Financial resource strain: Not on file  . Food insecurity:    Worry: Not on file    Inability: Not on file  . Transportation needs:    Medical: Not on file    Non-medical: Not on file  Tobacco Use  . Smoking status: Current Every Day Smoker    Years: 61.00    Types: E-cigarettes    Last attempt to quit: 04/11/2014    Years since quitting: 3.5  . Smokeless tobacco: Never Used  . Tobacco comment: patient does vape smoking x 2 years  Substance and Sexual Activity  . Alcohol use: No    Comment: quit 10 years  . Drug use: No  . Sexual activity: Not on file  Lifestyle  . Physical activity:    Days per week: Not on file    Minutes per session: Not on file  . Stress: Not on file  Relationships  . Social connections:    Talks on phone: Not on file    Gets together: Not on file    Attends religious service: Not on file    Active member of club or organization: Not on file    Attends meetings of clubs or organizations: Not on file    Relationship status: Not on file  . Intimate partner violence:    Fear of current or ex partner: Not on file    Emotionally abused: Not on file    Physically abused: Not on file    Forced sexual activity: Not on file  Other Topics Concern  . Not on file    Social History Narrative  . Not on file    FAMILY HISTORY:  Family History  Problem Relation Age of Onset  . Stroke Mother   . Heart attack Father   . Heart attack Brother     CURRENT MEDICATIONS:  Outpatient Encounter Medications as of 11/13/2017  Medication Sig  . ALPRAZolam (XANAX) 1 MG tablet Take 1 mg by mouth 3 (three) times daily as needed for anxiety.  Marland Kitchen amLODipine (NORVASC) 2.5 MG tablet Take 2.5 mg by mouth daily.  . diphenoxylate-atropine (LOMOTIL) 2.5-0.025 MG tablet Take 1 tablet 4 (four) times daily as needed by mouth for diarrhea or loose stools.  Marland Kitchen  furosemide (LASIX) 20 MG tablet Take 2 tablets (40 mg total) by mouth daily.  Marland Kitchen GLIPIZIDE XL 5 MG 24 hr tablet Take 5 mg by mouth daily.  Marland Kitchen guaiFENesin (MUCINEX) 600 MG 12 hr tablet Take 600 mg by mouth 2 (two) times daily.  . hydrocortisone cream 0.5 % Apply 1 application topically 2 (two) times daily.  . hydrOXYzine (ATARAX/VISTARIL) 25 MG tablet Take 1 tablet (25 mg total) by mouth 3 (three) times daily as needed.  . lidocaine-prilocaine (EMLA) cream Apply a quarter size amount to affected area 1 hour prior to coming to chemotherapy. Cover with plastic wrap.  . metFORMIN (GLUCOPHAGE) 500 MG tablet Take 500 mg by mouth 2 (two) times daily.  . metoprolol succinate (TOPROL-XL) 25 MG 24 hr tablet TAKE ONE TABLET BY MOUTH IN THE EVENING.  Marland Kitchen nystatin (MYCOSTATIN) 100000 UNIT/ML suspension Take 5 mLs (500,000 Units total) by mouth 4 (four) times daily.  . ondansetron (ZOFRAN) 8 MG tablet Take 8 mg by mouth every 8 (eight) hours as needed for nausea or vomiting.  Marland Kitchen oxyCODONE-acetaminophen (PERCOCET) 10-325 MG tablet   . Pembrolizumab (KEYTRUDA IV) Inject into the vein.  Marland Kitchen prochlorperazine (COMPAZINE) 10 MG tablet   . simvastatin (ZOCOR) 20 MG tablet Take 20 mg by mouth every evening.  . Skin Protectants, Misc. (EUCERIN) cream Apply topically as needed for dry skin.  Marland Kitchen spironolactone (ALDACTONE) 50 MG tablet Take 1 tablet (50 mg  total) by mouth daily.  Marland Kitchen triamcinolone cream (KENALOG) 0.1 % Apply 1 application topically 2 (two) times daily.  . [DISCONTINUED] azithromycin (ZITHROMAX Z-PAK) 250 MG tablet Take 2 tabs on day 1 the one tab daily until all gone   No facility-administered encounter medications on file as of 11/13/2017.     ALLERGIES:  No Known Allergies   PHYSICAL EXAM:  ECOG Performance status: 1  VITAL SIGNS: BP: 165/95, P: 50, R: 18, TEMP: 97.6, 02: 96%  Physical Exam   LABORATORY DATA:  I have reviewed the labs as listed.  CBC    Component Value Date/Time   WBC 12.0 (H) 11/13/2017 0811   RBC 5.22 11/13/2017 0811   HGB 15.4 11/13/2017 0811   HGB 13.5 05/02/2016 0852   HCT 45.7 11/13/2017 0811   HCT 41.0 05/02/2016 0852   PLT 272 11/13/2017 0811   PLT 436 (H) 05/02/2016 0852   MCV 87.5 11/13/2017 0811   MCV 85.7 05/02/2016 0852   MCH 29.5 11/13/2017 0811   MCHC 33.7 11/13/2017 0811   RDW 14.3 11/13/2017 0811   RDW 13.9 05/02/2016 0852   LYMPHSABS 4.4 (H) 11/13/2017 0811   LYMPHSABS 3.4 (H) 05/02/2016 0852   MONOABS 1.0 11/13/2017 0811   MONOABS 1.2 (H) 05/02/2016 0852   EOSABS 0.8 (H) 11/13/2017 0811   EOSABS 1.6 (H) 05/02/2016 0852   BASOSABS 0.1 11/13/2017 0811   BASOSABS 0.2 (H) 05/02/2016 0852   CMP Latest Ref Rng & Units 11/13/2017 10/23/2017 10/02/2017  Glucose 70 - 99 mg/dL 101(H) 122(H) 85  BUN 8 - 23 mg/dL _0 Creatinine 0.61 - 1.24 mg/dL 1.02 1.06 1.22  Sodium 135 - 145 mmol/L 141 139 140  Potassium 3.5 - 5.1 mmol/L 4.1 3.8 3.8  Chloride 98 - 111 mmol/L 106 103 104  CO2 22 - 32 mmol/L _1 Calcium 8.9 - 10.3 mg/dL 8.9 8.7(L) 8.7(L)  Total Protein 6.5 - 8.1 g/dL 7.3 7.3 7.3  Total Bilirubin 0.3 - 1.2 mg/dL 0.5 0.6 0.6  Alkaline Phos 38 -  126 U/L 84 82 70  AST 15 - 41 U/L _0 ALT 0 - 44 U/L _1 DIAGNOSTIC IMAGING:  I have independently reviewed the images of the CT scan dated 10/13/2017 and reviewed with the  patient.     ASSESSMENT & PLAN:   Primary cancer of right upper lobe of lung ([facility] [place]) 1.  Stage IV adenocarcinoma of the lung, PDL 1-50%, other actionable mutations negative: -Metastatic disease in the right upper lobe, mediastinal and hilar adenopathy, descending mesocolon, left upper buttock subcutaneous tissue, left upper lobe, right subscapularis muscle, left adrenal gland, right breast subcutaneous tissue - Keytruda started on 05/17/2016,  CT scan on 07/09/2017 showing decrease in size of lung nodules, 2 nodules in the serosa of the sigmoid colon, status post colonoscopy which showed submucosal 12 mm firm lesion in the sigmoid colon, no biopsies done. - He has mild diarrhea which is controlled with antidiarrheal medicine.  It usually lasts 2 to 3 days after treatment.  CT scan of the chest, abdomen and pelvis on 10/13/2017 showed stable disease.  His blood counts are adequate to proceed with his next cycle of Keytruda.  I will see him back on September 12 and will repeat a scan prior to his visit.  2.  Itching: Since he started pembrolizumab, he is having itching on his upper trunk, predominantly on the back.  He tried doxepin cream which made it worse.  He has tried Atarax and topical steroid creams.  Nothing is helping him.  We will make a referral to dermatology.      Orders placed this encounter:  Orders Placed This Encounter  Procedures  . CT Abdomen Pelvis W Contrast  . CT Chest W Contrast  . CBC with Differential/Platelet  . Comprehensive metabolic panel      Derek Jack, MD [place] [phone]

## 2017-12-03 DIAGNOSIS — L308 Other specified dermatitis: Secondary | ICD-10-CM | POA: Diagnosis not present

## 2017-12-03 DIAGNOSIS — C44319 Basal cell carcinoma of skin of other parts of face: Secondary | ICD-10-CM | POA: Diagnosis not present

## 2017-12-04 ENCOUNTER — Inpatient Hospital Stay (HOSPITAL_COMMUNITY): Payer: Medicare Other | Attending: Hematology

## 2017-12-04 ENCOUNTER — Inpatient Hospital Stay (HOSPITAL_COMMUNITY): Payer: Medicare Other

## 2017-12-04 VITALS — BP 170/88 | HR 78 | Temp 97.6°F | Resp 18 | Wt 237.8 lb

## 2017-12-04 DIAGNOSIS — Z86711 Personal history of pulmonary embolism: Secondary | ICD-10-CM | POA: Diagnosis not present

## 2017-12-04 DIAGNOSIS — Z79899 Other long term (current) drug therapy: Secondary | ICD-10-CM | POA: Insufficient documentation

## 2017-12-04 DIAGNOSIS — E119 Type 2 diabetes mellitus without complications: Secondary | ICD-10-CM | POA: Insufficient documentation

## 2017-12-04 DIAGNOSIS — I10 Essential (primary) hypertension: Secondary | ICD-10-CM | POA: Insufficient documentation

## 2017-12-04 DIAGNOSIS — Z7984 Long term (current) use of oral hypoglycemic drugs: Secondary | ICD-10-CM | POA: Insufficient documentation

## 2017-12-04 DIAGNOSIS — C499 Malignant neoplasm of connective and soft tissue, unspecified: Secondary | ICD-10-CM

## 2017-12-04 DIAGNOSIS — C3411 Malignant neoplasm of upper lobe, right bronchus or lung: Secondary | ICD-10-CM | POA: Insufficient documentation

## 2017-12-04 DIAGNOSIS — R197 Diarrhea, unspecified: Secondary | ICD-10-CM | POA: Insufficient documentation

## 2017-12-04 DIAGNOSIS — R590 Localized enlarged lymph nodes: Secondary | ICD-10-CM | POA: Insufficient documentation

## 2017-12-04 DIAGNOSIS — L299 Pruritus, unspecified: Secondary | ICD-10-CM | POA: Diagnosis not present

## 2017-12-04 DIAGNOSIS — Z9221 Personal history of antineoplastic chemotherapy: Secondary | ICD-10-CM | POA: Insufficient documentation

## 2017-12-04 DIAGNOSIS — K760 Fatty (change of) liver, not elsewhere classified: Secondary | ICD-10-CM | POA: Insufficient documentation

## 2017-12-04 DIAGNOSIS — Z5112 Encounter for antineoplastic immunotherapy: Secondary | ICD-10-CM | POA: Diagnosis not present

## 2017-12-04 LAB — COMPREHENSIVE METABOLIC PANEL
ALT: 20 U/L (ref 0–44)
AST: 20 U/L (ref 15–41)
Albumin: 4 g/dL (ref 3.5–5.0)
Alkaline Phosphatase: 86 U/L (ref 38–126)
Anion gap: 7 (ref 5–15)
BUN: 13 mg/dL (ref 8–23)
CO2: 29 mmol/L (ref 22–32)
Calcium: 9.1 mg/dL (ref 8.9–10.3)
Chloride: 105 mmol/L (ref 98–111)
Creatinine, Ser: 1.03 mg/dL (ref 0.61–1.24)
GFR calc Af Amer: >60 mL/min (ref >60)
GFR calc non Af Amer: >60 mL/min (ref >60)
Glucose, Bld: 115 mg/dL — ABNORMAL HIGH (ref 70–99)
Potassium: 4 mmol/L (ref 3.5–5.1)
Sodium: 141 mmol/L (ref 135–145)
Total Bilirubin: 0.5 mg/dL (ref 0.3–1.2)
Total Protein: 6.9 g/dL (ref 6.5–8.1)

## 2017-12-04 LAB — CBC WITH DIFFERENTIAL/PLATELET
Basophils Absolute: 0.1 10*3/uL (ref 0.0–0.1)
Basophils Relative: 1 %
Eosinophils Absolute: 0.7 10*3/uL (ref 0.0–0.7)
Eosinophils Relative: 6 %
HCT: 45.4 % (ref 39.0–52.0)
Hemoglobin: 15.2 g/dL (ref 13.0–17.0)
Lymphocytes Relative: 38 %
Lymphs Abs: 4.7 10*3/uL — ABNORMAL HIGH (ref 0.7–4.0)
MCH: 29.7 pg (ref 26.0–34.0)
MCHC: 33.5 g/dL (ref 30.0–36.0)
MCV: 88.8 fL (ref 78.0–100.0)
Monocytes Absolute: 1.1 10*3/uL — ABNORMAL HIGH (ref 0.1–1.0)
Monocytes Relative: 9 %
Neutro Abs: 5.9 10*3/uL (ref 1.7–7.7)
Neutrophils Relative %: 46 %
Platelets: 245 10*3/uL (ref 150–400)
RBC: 5.11 MIL/uL (ref 4.22–5.81)
RDW: 14.6 % (ref 11.5–15.5)
WBC: 12.4 10*3/uL — ABNORMAL HIGH (ref 4.0–10.5)

## 2017-12-04 MED ORDER — SODIUM CHLORIDE 0.9% FLUSH
10.0000 mL | INTRAVENOUS | Status: DC | PRN
  Administered 2017-12-04: 10 mL
  Filled 2017-12-04: qty 10

## 2017-12-04 MED ORDER — SODIUM CHLORIDE 0.9 % IV SOLN
Freq: Once | INTRAVENOUS | Status: AC
  Administered 2017-12-04: 09:00:00 via INTRAVENOUS

## 2017-12-04 MED ORDER — SODIUM CHLORIDE 0.9 % IV SOLN
200.0000 mg | Freq: Once | INTRAVENOUS | Status: AC
  Administered 2017-12-04: 200 mg via INTRAVENOUS
  Filled 2017-12-04: qty 8

## 2017-12-04 MED ORDER — HEPARIN SOD (PORK) LOCK FLUSH 100 UNIT/ML IV SOLN
500.0000 [IU] | Freq: Once | INTRAVENOUS | Status: AC | PRN
  Administered 2017-12-04: 500 [IU]

## 2017-12-04 NOTE — Progress Notes (Signed)
Nishawn G Meckler tolerated Keytruda infusion well without complaints or incident. Labs reviewed prior to administering this medication. Pt seen by dermatologist yesterday for continued itching and several scripts were written but pt has not picked them up yet. VSS upon discharge. Pt discharged self ambulatory in satisfactory condition accompanied by his daughter

## 2017-12-04 NOTE — Patient Instructions (Signed)
[place] at [facility]  Discharge Instructions:  You received your Keytruda infusion today. Follow up as scheduled. Call clinic for any questions or concerns.  _______________________________________________________________  Thank you for choosing [place] at [facility] to provide your oncology and hematology care.  To afford each patient quality time with our providers, please arrive at least 15 minutes before your scheduled appointment.  You need to re-schedule your appointment if you arrive 10 or more minutes late.  We strive to give you quality time with our providers, and arriving late affects you and other patients whose appointments are after yours.  Also, if you no show three or more times for appointments you may be dismissed from the clinic.  Again, thank you for choosing [place] at [facility] hope is that these requests will allow you access to exceptional care and in a timely manner. _______________________________________________________________  If you have questions after your visit, please contact our office at [phone] between the hours of 8:30 a.m. and 5:00 p.m. Voicemails left after 4:30 p.m. will not be returned until the following business day. _______________________________________________________________  For prescription refill requests, have your pharmacy contact our office. _______________________________________________________________  Recommendations made by the consultant and any test results will be sent to your referring physician. _______________________________________________________________

## 2017-12-04 NOTE — Progress Notes (Signed)
[id number] Labs reviewed and patient approved for treatment today.

## 2017-12-08 ENCOUNTER — Other Ambulatory Visit (HOSPITAL_COMMUNITY): Payer: Self-pay | Admitting: Hematology

## 2017-12-08 DIAGNOSIS — R600 Localized edema: Secondary | ICD-10-CM

## 2017-12-24 DIAGNOSIS — H539 Unspecified visual disturbance: Secondary | ICD-10-CM | POA: Diagnosis not present

## 2017-12-24 DIAGNOSIS — Z6833 Body mass index (BMI) 33.0-33.9, adult: Secondary | ICD-10-CM | POA: Diagnosis not present

## 2017-12-25 ENCOUNTER — Inpatient Hospital Stay (HOSPITAL_COMMUNITY): Payer: Medicare Other

## 2017-12-25 ENCOUNTER — Encounter (HOSPITAL_COMMUNITY): Payer: Self-pay

## 2017-12-25 ENCOUNTER — Other Ambulatory Visit: Payer: Self-pay

## 2017-12-25 VITALS — BP 156/97 | HR 88 | Temp 97.6°F | Resp 18 | Wt 233.2 lb

## 2017-12-25 DIAGNOSIS — C3411 Malignant neoplasm of upper lobe, right bronchus or lung: Secondary | ICD-10-CM

## 2017-12-25 DIAGNOSIS — Z5112 Encounter for antineoplastic immunotherapy: Secondary | ICD-10-CM | POA: Diagnosis not present

## 2017-12-25 DIAGNOSIS — C499 Malignant neoplasm of connective and soft tissue, unspecified: Secondary | ICD-10-CM

## 2017-12-25 LAB — COMPREHENSIVE METABOLIC PANEL
ALT: 22 U/L (ref 0–44)
AST: 21 U/L (ref 15–41)
Albumin: 4 g/dL (ref 3.5–5.0)
Alkaline Phosphatase: 85 U/L (ref 38–126)
Anion gap: 10 (ref 5–15)
BUN: 10 mg/dL (ref 8–23)
CO2: 25 mmol/L (ref 22–32)
Calcium: 8.8 mg/dL — ABNORMAL LOW (ref 8.9–10.3)
Chloride: 105 mmol/L (ref 98–111)
Creatinine, Ser: 1.12 mg/dL (ref 0.61–1.24)
GFR calc Af Amer: >60 mL/min (ref >60)
GFR calc non Af Amer: >60 mL/min (ref >60)
Glucose, Bld: 160 mg/dL — ABNORMAL HIGH (ref 70–99)
Potassium: 3.8 mmol/L (ref 3.5–5.1)
Sodium: 140 mmol/L (ref 135–145)
Total Bilirubin: 0.6 mg/dL (ref 0.3–1.2)
Total Protein: 7 g/dL (ref 6.5–8.1)

## 2017-12-25 LAB — CBC WITH DIFFERENTIAL/PLATELET
Basophils Absolute: 0.1 10*3/uL (ref 0.0–0.1)
Basophils Relative: 1 %
Eosinophils Absolute: 0.6 10*3/uL (ref 0.0–0.7)
Eosinophils Relative: 5 %
HCT: 46.3 % (ref 39.0–52.0)
Hemoglobin: 15.6 g/dL (ref 13.0–17.0)
Lymphocytes Relative: 33 %
Lymphs Abs: 3.9 10*3/uL (ref 0.7–4.0)
MCH: 29.9 pg (ref 26.0–34.0)
MCHC: 33.7 g/dL (ref 30.0–36.0)
MCV: 88.7 fL (ref 78.0–100.0)
Monocytes Absolute: 0.9 10*3/uL (ref 0.1–1.0)
Monocytes Relative: 8 %
Neutro Abs: 6.3 10*3/uL (ref 1.7–7.7)
Neutrophils Relative %: 53 %
Platelets: 273 10*3/uL (ref 150–400)
RBC: 5.22 MIL/uL (ref 4.22–5.81)
RDW: 14.5 % (ref 11.5–15.5)
WBC: 11.8 10*3/uL — ABNORMAL HIGH (ref 4.0–10.5)

## 2017-12-25 LAB — TSH: TSH: 1.383 u[IU]/mL (ref 0.350–4.500)

## 2017-12-25 MED ORDER — SODIUM CHLORIDE 0.9% FLUSH
10.0000 mL | INTRAVENOUS | Status: DC | PRN
  Administered 2017-12-25: 10 mL
  Filled 2017-12-25: qty 10

## 2017-12-25 MED ORDER — SODIUM CHLORIDE 0.9 % IV SOLN
Freq: Once | INTRAVENOUS | Status: AC
  Administered 2017-12-25: 10:00:00 via INTRAVENOUS

## 2017-12-25 MED ORDER — SODIUM CHLORIDE 0.9 % IV SOLN
200.0000 mg | Freq: Once | INTRAVENOUS | Status: AC
  Administered 2017-12-25: 200 mg via INTRAVENOUS
  Filled 2017-12-25: qty 8

## 2017-12-25 MED ORDER — HEPARIN SOD (PORK) LOCK FLUSH 100 UNIT/ML IV SOLN
500.0000 [IU] | Freq: Once | INTRAVENOUS | Status: AC | PRN
  Administered 2017-12-25: 500 [IU]

## 2017-12-25 NOTE — Patient Instructions (Signed)
[facility] Discharge Instructions for Patients Receiving Chemotherapy   Beginning January 23rd 2017 lab work for the Cancer Center will be done in the  Main lab at [facility] on 1st floor. If you have a lab appointment with the Cancer Center please come in thru the  Main Entrance and check in at the main information desk   Today you received the following chemotherapy agents   To help prevent nausea and vomiting after your treatment, we encourage you to take your nausea medication     If you develop nausea and vomiting, or diarrhea that is not controlled by your medication, call the clinic.  The clinic phone number is [phone]. Office hours are Monday-Friday 8:30am-5:00pm.  BELOW ARE SYMPTOMS THAT SHOULD BE REPORTED IMMEDIATELY:  *FEVER GREATER THAN 101.0 F  *CHILLS WITH OR WITHOUT FEVER  NAUSEA AND VOMITING THAT IS NOT CONTROLLED WITH YOUR NAUSEA MEDICATION  *UNUSUAL SHORTNESS OF BREATH  *UNUSUAL BRUISING OR BLEEDING  TENDERNESS IN MOUTH AND THROAT WITH OR WITHOUT PRESENCE OF ULCERS  *URINARY PROBLEMS  *BOWEL PROBLEMS  UNUSUAL RASH Items with * indicate a potential emergency and should be followed up as soon as possible. If you have an emergency after office hours please contact your primary care physician or go to the nearest emergency department.  Please call the clinic during office hours if you have any questions or concerns.   You may also contact the Patient Navigator at [phone] should you have any questions or need assistance in obtaining follow up care.      Resources For Cancer Patients and their Caregivers ? American Cancer Society: Can assist with transportation, wigs, general needs, runs Look Good Feel Better.        1-888-227-6333 ? Cancer Care: Provides financial assistance, online support groups, medication/co-pay assistance.  1-800-813-HOPE (4673) ? [facility] Assists [place] cancer  patients and their families through emotional , educational and financial support.  336-427-4357 ? [place] DSS Where to apply for food stamps, Medicaid and utility assistance. 336-342-1394 ? [facility]: Transportation to medical appointments. 336-347-2287 ? Social Security Administration: May apply for disability if have a Stage IV cancer. 336-342-7796 1-800-772-1213 ? [place] Aging, Disability and Transit Services: Assists with nutrition, care and transit needs. 336-349-2343         

## 2017-12-25 NOTE — Progress Notes (Signed)
Labs reviewed with MD. Labs meet parameters. Proceed with treatment.  Treatment given per orders. Patient tolerated it well without problems. Vitals stable and discharged home from clinic ambulatory. Follow up as scheduled.

## 2017-12-31 DIAGNOSIS — Z6833 Body mass index (BMI) 33.0-33.9, adult: Secondary | ICD-10-CM | POA: Diagnosis not present

## 2017-12-31 DIAGNOSIS — C3411 Malignant neoplasm of upper lobe, right bronchus or lung: Secondary | ICD-10-CM | POA: Diagnosis not present

## 2017-12-31 DIAGNOSIS — R600 Localized edema: Secondary | ICD-10-CM | POA: Diagnosis not present

## 2018-01-02 DIAGNOSIS — Z6832 Body mass index (BMI) 32.0-32.9, adult: Secondary | ICD-10-CM | POA: Diagnosis not present

## 2018-01-02 DIAGNOSIS — Z720 Tobacco use: Secondary | ICD-10-CM | POA: Diagnosis not present

## 2018-01-02 DIAGNOSIS — C3411 Malignant neoplasm of upper lobe, right bronchus or lung: Secondary | ICD-10-CM | POA: Diagnosis not present

## 2018-01-02 DIAGNOSIS — F411 Generalized anxiety disorder: Secondary | ICD-10-CM | POA: Diagnosis not present

## 2018-01-02 DIAGNOSIS — R600 Localized edema: Secondary | ICD-10-CM | POA: Diagnosis not present

## 2018-01-02 DIAGNOSIS — E1165 Type 2 diabetes mellitus with hyperglycemia: Secondary | ICD-10-CM | POA: Diagnosis not present

## 2018-01-13 ENCOUNTER — Ambulatory Visit (HOSPITAL_COMMUNITY)
Admission: RE | Admit: 2018-01-13 | Discharge: 2018-01-13 | Disposition: A | Discharge status: Home / Self Care | Payer: Medicare Other | Source: Ambulatory Visit | Attending: Nurse Practitioner | Admitting: Nurse Practitioner

## 2018-01-13 DIAGNOSIS — R918 Other nonspecific abnormal finding of lung field: Secondary | ICD-10-CM | POA: Diagnosis not present

## 2018-01-13 DIAGNOSIS — K760 Fatty (change of) liver, not elsewhere classified: Secondary | ICD-10-CM | POA: Diagnosis not present

## 2018-01-13 DIAGNOSIS — C3411 Malignant neoplasm of upper lobe, right bronchus or lung: Secondary | ICD-10-CM

## 2018-01-13 DIAGNOSIS — C3491 Malignant neoplasm of unspecified part of right bronchus or lung: Secondary | ICD-10-CM | POA: Insufficient documentation

## 2018-01-13 MED ORDER — IOPAMIDOL (ISOVUE-300) INJECTION 61%
100.0000 mL | Freq: Once | INTRAVENOUS | Status: AC | PRN
  Administered 2018-01-13: 100 mL via INTRAVENOUS

## 2018-01-15 ENCOUNTER — Ambulatory Visit (HOSPITAL_COMMUNITY): Payer: Medicare Other | Admitting: Internal Medicine

## 2018-01-15 ENCOUNTER — Inpatient Hospital Stay (HOSPITAL_COMMUNITY): Payer: Medicare Other | Attending: Hematology

## 2018-01-15 ENCOUNTER — Inpatient Hospital Stay (HOSPITAL_COMMUNITY): Payer: Medicare Other

## 2018-01-15 VITALS — BP 148/77 | HR 87 | Temp 97.7°F | Resp 18 | Wt 229.0 lb

## 2018-01-15 DIAGNOSIS — R590 Localized enlarged lymph nodes: Secondary | ICD-10-CM | POA: Insufficient documentation

## 2018-01-15 DIAGNOSIS — R197 Diarrhea, unspecified: Secondary | ICD-10-CM | POA: Insufficient documentation

## 2018-01-15 DIAGNOSIS — Z5112 Encounter for antineoplastic immunotherapy: Secondary | ICD-10-CM | POA: Insufficient documentation

## 2018-01-15 DIAGNOSIS — Z7984 Long term (current) use of oral hypoglycemic drugs: Secondary | ICD-10-CM | POA: Insufficient documentation

## 2018-01-15 DIAGNOSIS — C3411 Malignant neoplasm of upper lobe, right bronchus or lung: Secondary | ICD-10-CM | POA: Diagnosis not present

## 2018-01-15 DIAGNOSIS — Z86711 Personal history of pulmonary embolism: Secondary | ICD-10-CM | POA: Insufficient documentation

## 2018-01-15 DIAGNOSIS — L299 Pruritus, unspecified: Secondary | ICD-10-CM | POA: Insufficient documentation

## 2018-01-15 DIAGNOSIS — I10 Essential (primary) hypertension: Secondary | ICD-10-CM | POA: Diagnosis not present

## 2018-01-15 DIAGNOSIS — Z9221 Personal history of antineoplastic chemotherapy: Secondary | ICD-10-CM | POA: Insufficient documentation

## 2018-01-15 DIAGNOSIS — E119 Type 2 diabetes mellitus without complications: Secondary | ICD-10-CM | POA: Insufficient documentation

## 2018-01-15 DIAGNOSIS — K760 Fatty (change of) liver, not elsewhere classified: Secondary | ICD-10-CM | POA: Insufficient documentation

## 2018-01-15 DIAGNOSIS — Z79899 Other long term (current) drug therapy: Secondary | ICD-10-CM | POA: Diagnosis not present

## 2018-01-15 LAB — CBC WITH DIFFERENTIAL/PLATELET
Basophils Absolute: 0.1 10*3/uL (ref 0.0–0.1)
Basophils Relative: 1 %
Eosinophils Absolute: 0.7 10*3/uL (ref 0.0–0.7)
Eosinophils Relative: 6 %
HCT: 44.4 % (ref 39.0–52.0)
Hemoglobin: 15 g/dL (ref 13.0–17.0)
Lymphocytes Relative: 39 %
Lymphs Abs: 4.5 10*3/uL — ABNORMAL HIGH (ref 0.7–4.0)
MCH: 29.9 pg (ref 26.0–34.0)
MCHC: 33.8 g/dL (ref 30.0–36.0)
MCV: 88.4 fL (ref 78.0–100.0)
Monocytes Absolute: 1 10*3/uL (ref 0.1–1.0)
Monocytes Relative: 8 %
Neutro Abs: 5.4 10*3/uL (ref 1.7–7.7)
Neutrophils Relative %: 46 %
Platelets: 284 10*3/uL (ref 150–400)
RBC: 5.02 MIL/uL (ref 4.22–5.81)
RDW: 14.2 % (ref 11.5–15.5)
WBC: 11.6 10*3/uL — ABNORMAL HIGH (ref 4.0–10.5)

## 2018-01-15 LAB — COMPREHENSIVE METABOLIC PANEL
ALT: 21 U/L (ref 0–44)
AST: 20 U/L (ref 15–41)
Albumin: 3.9 g/dL (ref 3.5–5.0)
Alkaline Phosphatase: 85 U/L (ref 38–126)
Anion gap: 11 (ref 5–15)
BUN: 14 mg/dL (ref 8–23)
CO2: 27 mmol/L (ref 22–32)
Calcium: 8.9 mg/dL (ref 8.9–10.3)
Chloride: 104 mmol/L (ref 98–111)
Creatinine, Ser: 1.21 mg/dL (ref 0.61–1.24)
GFR calc Af Amer: >60 mL/min (ref >60)
GFR calc non Af Amer: 58 mL/min — ABNORMAL LOW (ref >60)
Glucose, Bld: 175 mg/dL — ABNORMAL HIGH (ref 70–99)
Potassium: 3.4 mmol/L — ABNORMAL LOW (ref 3.5–5.1)
Sodium: 142 mmol/L (ref 135–145)
Total Bilirubin: 0.4 mg/dL (ref 0.3–1.2)
Total Protein: 7.1 g/dL (ref 6.5–8.1)

## 2018-01-15 MED ORDER — SODIUM CHLORIDE 0.9 % IV SOLN
200.0000 mg | Freq: Once | INTRAVENOUS | Status: AC
  Administered 2018-01-15: 200 mg via INTRAVENOUS
  Filled 2018-01-15: qty 8

## 2018-01-15 MED ORDER — HEPARIN SOD (PORK) LOCK FLUSH 100 UNIT/ML IV SOLN
500.0000 [IU] | Freq: Once | INTRAVENOUS | Status: AC | PRN
  Administered 2018-01-15: 500 [IU]

## 2018-01-15 MED ORDER — SODIUM CHLORIDE 0.9% FLUSH
10.0000 mL | INTRAVENOUS | Status: DC | PRN
  Administered 2018-01-15: 10 mL
  Filled 2018-01-15: qty 10

## 2018-01-15 MED ORDER — SODIUM CHLORIDE 0.9 % IV SOLN
Freq: Once | INTRAVENOUS | Status: AC
  Administered 2018-01-15: 09:00:00 via INTRAVENOUS

## 2018-01-15 NOTE — Patient Instructions (Signed)
[facility] Discharge Instructions for Patients Receiving Chemotherapy   Beginning January 23rd 2017 lab work for the Cancer Center will be done in the  Main lab at [facility] on 1st floor. If you have a lab appointment with the Cancer Center please come in thru the  Main Entrance and check in at the main information desk   Today you received the following chemotherapy agents   To help prevent nausea and vomiting after your treatment, we encourage you to take your nausea medication     If you develop nausea and vomiting, or diarrhea that is not controlled by your medication, call the clinic.  The clinic phone number is [phone]. Office hours are Monday-Friday 8:30am-5:00pm.  BELOW ARE SYMPTOMS THAT SHOULD BE REPORTED IMMEDIATELY:  *FEVER GREATER THAN 101.0 F  *CHILLS WITH OR WITHOUT FEVER  NAUSEA AND VOMITING THAT IS NOT CONTROLLED WITH YOUR NAUSEA MEDICATION  *UNUSUAL SHORTNESS OF BREATH  *UNUSUAL BRUISING OR BLEEDING  TENDERNESS IN MOUTH AND THROAT WITH OR WITHOUT PRESENCE OF ULCERS  *URINARY PROBLEMS  *BOWEL PROBLEMS  UNUSUAL RASH Items with * indicate a potential emergency and should be followed up as soon as possible. If you have an emergency after office hours please contact your primary care physician or go to the nearest emergency department.  Please call the clinic during office hours if you have any questions or concerns.   You may also contact the Patient Navigator at [phone] should you have any questions or need assistance in obtaining follow up care.      Resources For Cancer Patients and their Caregivers ? American Cancer Society: Can assist with transportation, wigs, general needs, runs Look Good Feel Better.        1-888-227-6333 ? Cancer Care: Provides financial assistance, online support groups, medication/co-pay assistance.  1-800-813-HOPE (4673) ? [facility] Assists [place] cancer  patients and their families through emotional , educational and financial support.  336-427-4357 ? [place] DSS Where to apply for food stamps, Medicaid and utility assistance. 336-342-1394 ? [facility]: Transportation to medical appointments. 336-347-2287 ? Social Security Administration: May apply for disability if have a Stage IV cancer. 336-342-7796 1-800-772-1213 ? [place] Aging, Disability and Transit Services: Assists with nutrition, care and transit needs. 336-349-2343         

## 2018-01-15 NOTE — Progress Notes (Signed)
Labs meet parameters , will proceed with treatment today.   Informed patient that his scans were stable per Dr. Walden Field.   Treatment given per orders. Patient tolerated it well without problems. Vitals stable and discharged home from clinic ambulatory. Follow up as scheduled.

## 2018-01-19 DIAGNOSIS — R600 Localized edema: Secondary | ICD-10-CM | POA: Diagnosis not present

## 2018-01-19 DIAGNOSIS — Z6832 Body mass index (BMI) 32.0-32.9, adult: Secondary | ICD-10-CM | POA: Diagnosis not present

## 2018-01-23 DIAGNOSIS — F411 Generalized anxiety disorder: Secondary | ICD-10-CM | POA: Diagnosis not present

## 2018-01-23 DIAGNOSIS — R7989 Other specified abnormal findings of blood chemistry: Secondary | ICD-10-CM | POA: Diagnosis not present

## 2018-01-23 DIAGNOSIS — C3411 Malignant neoplasm of upper lobe, right bronchus or lung: Secondary | ICD-10-CM | POA: Diagnosis not present

## 2018-01-23 DIAGNOSIS — Z6833 Body mass index (BMI) 33.0-33.9, adult: Secondary | ICD-10-CM | POA: Diagnosis not present

## 2018-01-23 DIAGNOSIS — Z720 Tobacco use: Secondary | ICD-10-CM | POA: Diagnosis not present

## 2018-01-23 DIAGNOSIS — E1165 Type 2 diabetes mellitus with hyperglycemia: Secondary | ICD-10-CM | POA: Diagnosis not present

## 2018-01-23 DIAGNOSIS — R600 Localized edema: Secondary | ICD-10-CM | POA: Diagnosis not present

## 2018-02-02 DIAGNOSIS — C3411 Malignant neoplasm of upper lobe, right bronchus or lung: Secondary | ICD-10-CM | POA: Diagnosis not present

## 2018-02-02 DIAGNOSIS — R600 Localized edema: Secondary | ICD-10-CM | POA: Diagnosis not present

## 2018-02-02 DIAGNOSIS — R0602 Shortness of breath: Secondary | ICD-10-CM | POA: Diagnosis not present

## 2018-02-02 DIAGNOSIS — Z6833 Body mass index (BMI) 33.0-33.9, adult: Secondary | ICD-10-CM | POA: Diagnosis not present

## 2018-02-05 ENCOUNTER — Inpatient Hospital Stay (HOSPITAL_COMMUNITY): Payer: Medicare Other | Admitting: Hematology

## 2018-02-05 ENCOUNTER — Other Ambulatory Visit: Payer: Self-pay

## 2018-02-05 ENCOUNTER — Inpatient Hospital Stay (HOSPITAL_COMMUNITY): Payer: Medicare Other | Attending: Hematology

## 2018-02-05 ENCOUNTER — Encounter (HOSPITAL_COMMUNITY): Payer: Self-pay | Admitting: Hematology

## 2018-02-05 ENCOUNTER — Inpatient Hospital Stay (HOSPITAL_COMMUNITY): Payer: Medicare Other

## 2018-02-05 VITALS — BP 151/88 | HR 86 | Temp 98.0°F | Resp 18

## 2018-02-05 DIAGNOSIS — Z79899 Other long term (current) drug therapy: Secondary | ICD-10-CM | POA: Insufficient documentation

## 2018-02-05 DIAGNOSIS — Z86711 Personal history of pulmonary embolism: Secondary | ICD-10-CM | POA: Diagnosis not present

## 2018-02-05 DIAGNOSIS — I10 Essential (primary) hypertension: Secondary | ICD-10-CM | POA: Insufficient documentation

## 2018-02-05 DIAGNOSIS — E119 Type 2 diabetes mellitus without complications: Secondary | ICD-10-CM | POA: Diagnosis not present

## 2018-02-05 DIAGNOSIS — C7801 Secondary malignant neoplasm of right lung: Secondary | ICD-10-CM | POA: Diagnosis not present

## 2018-02-05 DIAGNOSIS — C3411 Malignant neoplasm of upper lobe, right bronchus or lung: Secondary | ICD-10-CM | POA: Diagnosis not present

## 2018-02-05 DIAGNOSIS — K760 Fatty (change of) liver, not elsewhere classified: Secondary | ICD-10-CM | POA: Diagnosis not present

## 2018-02-05 DIAGNOSIS — Z23 Encounter for immunization: Secondary | ICD-10-CM | POA: Diagnosis not present

## 2018-02-05 DIAGNOSIS — Z5112 Encounter for antineoplastic immunotherapy: Secondary | ICD-10-CM | POA: Diagnosis not present

## 2018-02-05 DIAGNOSIS — Z7984 Long term (current) use of oral hypoglycemic drugs: Secondary | ICD-10-CM | POA: Insufficient documentation

## 2018-02-05 DIAGNOSIS — C499 Malignant neoplasm of connective and soft tissue, unspecified: Secondary | ICD-10-CM

## 2018-02-05 LAB — COMPREHENSIVE METABOLIC PANEL
ALT: 25 U/L (ref 0–44)
AST: 22 U/L (ref 15–41)
Albumin: 4.2 g/dL (ref 3.5–5.0)
Alkaline Phosphatase: 90 U/L (ref 38–126)
Anion gap: 12 (ref 5–15)
BUN: 15 mg/dL (ref 8–23)
CO2: 25 mmol/L (ref 22–32)
Calcium: 9.1 mg/dL (ref 8.9–10.3)
Chloride: 103 mmol/L (ref 98–111)
Creatinine, Ser: 1.16 mg/dL (ref 0.61–1.24)
GFR calc Af Amer: >60 mL/min (ref >60)
GFR calc non Af Amer: >60 mL/min (ref >60)
Glucose, Bld: 136 mg/dL — ABNORMAL HIGH (ref 70–99)
Potassium: 3.9 mmol/L (ref 3.5–5.1)
Sodium: 140 mmol/L (ref 135–145)
Total Bilirubin: 0.6 mg/dL (ref 0.3–1.2)
Total Protein: 7.6 g/dL (ref 6.5–8.1)

## 2018-02-05 LAB — CBC WITH DIFFERENTIAL/PLATELET
Basophils Absolute: 0 10*3/uL (ref 0.0–0.1)
Basophils Relative: 0 %
Eosinophils Absolute: 0.7 10*3/uL (ref 0.0–0.7)
Eosinophils Relative: 5 %
HCT: 45.5 % (ref 39.0–52.0)
Hemoglobin: 15.3 g/dL (ref 13.0–17.0)
Lymphocytes Relative: 38 %
Lymphs Abs: 5.4 10*3/uL — ABNORMAL HIGH (ref 0.7–4.0)
MCH: 29.8 pg (ref 26.0–34.0)
MCHC: 33.6 g/dL (ref 30.0–36.0)
MCV: 88.5 fL (ref 78.0–100.0)
Monocytes Absolute: 1.3 10*3/uL — ABNORMAL HIGH (ref 0.1–1.0)
Monocytes Relative: 9 %
Neutro Abs: 6.7 10*3/uL (ref 1.7–7.7)
Neutrophils Relative %: 48 %
Platelets: 284 10*3/uL (ref 150–400)
RBC: 5.14 MIL/uL (ref 4.22–5.81)
RDW: 14.3 % (ref 11.5–15.5)
WBC: 14.1 10*3/uL — ABNORMAL HIGH (ref 4.0–10.5)

## 2018-02-05 LAB — TSH: TSH: 2.403 u[IU]/mL (ref 0.350–4.500)

## 2018-02-05 MED ORDER — SODIUM CHLORIDE 0.9 % IV SOLN
200.0000 mg | Freq: Once | INTRAVENOUS | Status: AC
  Administered 2018-02-05: 200 mg via INTRAVENOUS
  Filled 2018-02-05: qty 8

## 2018-02-05 MED ORDER — SODIUM CHLORIDE 0.9 % IV SOLN
Freq: Once | INTRAVENOUS | Status: AC
  Administered 2018-02-05: 10:00:00 via INTRAVENOUS

## 2018-02-05 MED ORDER — INFLUENZA VAC SPLIT HIGH-DOSE 0.5 ML IM SUSY
0.5000 mL | PREFILLED_SYRINGE | INTRAMUSCULAR | Status: AC
  Administered 2018-02-05: 0.5 mL via INTRAMUSCULAR
  Filled 2018-02-05: qty 0.5

## 2018-02-05 MED ORDER — HEPARIN SOD (PORK) LOCK FLUSH 100 UNIT/ML IV SOLN
500.0000 [IU] | Freq: Once | INTRAVENOUS | Status: AC | PRN
  Administered 2018-02-05: 500 [IU]
  Filled 2018-02-05: qty 5

## 2018-02-05 NOTE — Progress Notes (Signed)
[place], [place] [id number]   CLINIC:  Medical Oncology/Hematology  PCP:  Celene Squibb, MD [place] [address] [phone]   REASON FOR VISIT:  Follow-up for stage IV adenocarcinoma of the right lung  CURRENT THERAPY: keytruda  BRIEF ONCOLOGIC HISTORY:    Primary cancer of right upper lobe of lung ([place])   05/14/2015 Procedure    Port placement by Dr. Arnoldo Morale    04/01/2016 Initial Diagnosis    Primary cancer of right upper lobe of lung ([place])    04/02/2016 Procedure    Ultrasound-guided core biopsy performed of a solid 1.5 cm soft tissue nodule in the right chest wall.    04/03/2016 Imaging    MRI brain- No acute and intracranial process or metastasis.  Moderate chronic small vessel ischemic disease.    04/04/2016 Pathology Results    Diagnosis Soft Tissue Needle Core Biopsy, Right Chest Wall SMOOTH MUSCLE NEOPLASM Microscopic Comment The differential diagnosis include low grade leiomyosarcoma. The neoplasm shows spindle cell morphology with rare mitotic figures and minimal atypia, no necrosis present. These cells stains positive for smooth muscle actin, desmin, negative for cytokeratin AE1&3, ck8/18, s100, cd117, cd 34 and cd99. This case also reviewed by Dr. [name] and agree.    04/05/2016 Procedure    CT-guided biopsy of right upper lobe lesion, with tissue specimen sent to pathology, by IR.    04/09/2016 Pathology Results    Lung, needle/core biopsy(ies), Right Upper Lobe - NON SMALL CELL CARCINOMA.    04/17/2016 Pathology Results    PDL1 High Expression.  TPS 50%.    04/24/2016 PET scan    1. Prominently hypermetabolic large right upper lobe mass with metastatic disease to the right paratracheal and right hilar nodal chains, to the descending mesocolon, to the left upper buttock subcutaneous tissues, to the left upper lobe, to the right subscapularis muscle, and possibly to the left adrenal  gland and right breast subcutaneous tissues. Appearance compatible with stage IV lung cancer. 2. There is some focal high activity in the ascending colon which is probably from peristalsis, less likely from a local colon mass. This does raise the possibility that the adjacent mesocolon tumor implant could be due to synchronous colon cancer rather than lung metastasis. 3. Other imaging findings of potential clinical significance: Coronary, aortic arch, and branch vessel atherosclerotic vascular disease. Aortoiliac atherosclerotic vascular disease. Deformity left proximal humerus and left glenohumeral joint from prior trauma. Enlarged prostate gland.    04/26/2016 Pathology Results    FoundationONE: Genomic alterations identified- KRAS Y85O, CREBBP splice site [id number], LRP1B S515*, NOM76 H20*, NOB09 splice site [id number], SPTA1 splice site [id number], TP53 C135Y.  Additional findings- MS-Stable, TMB-intermediate (14 Muts/Mb).  No reportable alterations- EGFR, ALK, BRAF, MET, ERBB2, RET, ROS1.    05/17/2016 -  Chemotherapy    The patient had ONDANSETRON IVPB [facility] +/- DEXAMETHASONE, , Intravenous,  Once, 0 of 1 cycle  pembrolizumab (KEYTRUDA) 200 mg in sodium chloride 0.9 % 50 mL chemo infusion, 200 mg, Intravenous, Once, 0 of 5 cycles  for chemotherapy treatment.      08/06/2016 PET scan    IMPRESSION: 1. Overall considerable improvement. The right upper lobe mass is markedly reduced in volume and SUV. Thoracic adenopathy have resolved and the hypermetabolic lesion in the right subscapularis muscle has also resolved. 2. The left upper lobe nodule is no longer appreciably hypermetabolic, and is highly indistinct although this may be due to motion  artifact. Faint in indistinct nodularity elsewhere in the lungs likewise not currently hypermetabolic. 3. There is some very faint residual low-grade activity along the subcutaneous deposit along the upper left buttock region.  Marked reduction in size and activity of the tumor deposit adjacent to the descending colon. 4. Prior right pleural effusion has resolved. 5. Stable appearance of the adrenal glands, with low-level activity and a nodule in the left adrenal gland. 6. Stable small nodule in the right breast, low-grade metabolic activity with maximum SUV 2.2 (formerly 3.0) 7. Other imaging findings of potential clinical significance: Severe arthropathy of the left glenohumeral joint. Coronary, aortic arch, and branch vessel atherosclerotic vascular disease. Aortoiliac atherosclerotic vascular disease. Prominent prostate gland indents the bladder base.      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  Two adjacent large masslike areas of consolidation within the right upper lobe with differential considerations including malignancy or pneumonia. Multiple enlarged right hilar and mediastinal lymph nodes which may be reactive or metastatic in etiology.  Additional indeterminate pulmonary nodules as above. Recommend attention on follow-up.  Indeterminate 2.9 cm left adrenal nodule. This needs dedicated evaluation with pre and post contrast-enhanced CT or MRI after confirmation of pulmonary process.  Indeterminate nodule within the right breast. Recommend dedicated evaluation with mammography.  Hepatic steatosis.    04/02/2016 Procedure    Ultrasound-guided core biopsy performed of a solid 1.5 cm soft tissue nodule in the right chest wall.    04/04/2016 Pathology Results    Diagnosis Soft Tissue Needle Core Biopsy, Right Chest Wall SMOOTH MUSCLE NEOPLASM Microscopic Comment The differential diagnosis include low grade leiomyosarcoma. The neoplasm shows spindle cell morphology with rare mitotic figures and minimal atypia, no necrosis present. These cells  stains positive for smooth muscle actin, desmin, negative for cytokeratin AE1&3, ck8/18, s100, cd117, cd 34 and cd99. This case also reviewed by Dr. [name] and agree.       CANCER STAGING: Cancer Staging Primary cancer of right upper lobe of lung ([place]) Staging form: Lung, AJCC 8th Edition - Clinical: Stage IVB (cT3, cN2, cM1c) - Signed by Baird Cancer, PA-C on 05/07/2016    INTERVAL HISTORY:  Mr. Koors 72 y.o. male returns for routine follow-up for adenocarcinoma of the right lung. Patient is here today for treatment. He reports diarrhea for 7 days after treatment. He is also fatigued. His appetite is 50% and he is maintaining his weight at this time. He denies any nausea or vomiting. Denies any new pains. Denies any SOB or cough. Denies any fevers or recent infections.    REVIEW OF SYSTEMS:  Review of Systems  Gastrointestinal: Positive for diarrhea.  All other systems reviewed and are negative.    PAST MEDICAL/SURGICAL HISTORY:  Past Medical History:  Diagnosis Date  . Adrenal mass, left ([place]) 04/01/2016  . Diabetes mellitus ([facility]) 05/02/2016  . Diabetes mellitus without complication ([facility])   . Hypertension   . Mass of breast, right 04/01/2016  . Mass of upper lobe of right lung 04/01/2016   2 adjacent, 5 cm masses, hilar adenopathy  . Primary cancer of right upper lobe of lung ([facility]) 04/01/2016   2 adjacent, 5 cm masses, hilar adenopathy  . Pulmonary embolus ([facility]) 03/31/2016  . 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 COLONOSCOPY N/A 07/28/2017   Procedure: COLONOSCOPY;  Surgeon: Rogene Houston, MD;  Location: [facility] ENDO SUITE;  Service: Endoscopy;  Laterality: N/A;  205  . PORTACATH PLACEMENT Left 05/13/2016   Procedure: INSERTION PORT-A-CATH;  Surgeon: Aviva Signs, MD;  Location: [facility] ORS;  Service: General;  Laterality: Left;     SOCIAL HISTORY:  Social History   Socioeconomic History  . Marital status: Divorced     Spouse name: Not on file  . Number of children: Not on file  . Years of education: Not on file  . Highest education level: Not on file  Occupational History  . Not on file  Social Needs  . Financial resource strain: Not on file  . Food insecurity:    Worry: Not on file    Inability: Not on file  . Transportation needs:    Medical: Not on file    Non-medical: Not on file  Tobacco Use  . Smoking status: Current Every Day Smoker    Years: 61.00    Types: E-cigarettes    Last attempt to quit: 04/11/2014    Years since quitting: 3.8  . Smokeless tobacco: Never Used  . Tobacco comment: patient does vape smoking x 2 years  Substance and Sexual Activity  . Alcohol use: No    Comment: quit 10 years  . Drug use: No  . Sexual activity: Not on file  Lifestyle  . Physical activity:    Days per week: Not on file    Minutes per session: Not on file  . Stress: Not on file  Relationships  . Social connections:    Talks on phone: Not on file    Gets together: Not on file    Attends religious service: Not on file    Active member of club or organization: Not on file    Attends meetings of clubs or organizations: Not on file    Relationship status: Not on file  . Intimate partner violence:    Fear of current or ex partner: Not on file    Emotionally abused: Not on file    Physically abused: Not on file    Forced sexual activity: Not on file  Other Topics Concern  . Not on file  Social History Narrative  . Not on file    FAMILY HISTORY:  Family History  Problem Relation Age of Onset  . Stroke Mother   . Heart attack Father   . Heart attack Brother     CURRENT MEDICATIONS:  Outpatient Encounter Medications as of 02/05/2018  Medication Sig  . ALPRAZolam (XANAX) 1 MG tablet Take 1 mg by mouth 3 (three) times daily as needed for anxiety.  Marland Kitchen amLODipine (NORVASC) 2.5 MG tablet Take 2.5 mg by mouth daily.  . diphenoxylate-atropine (LOMOTIL) 2.5-0.025 MG tablet Take 1 tablet 4 (four)  times daily as needed by mouth for diarrhea or loose stools.  . furosemide (LASIX) 20 MG tablet Take 2 tablets (40 mg total) by mouth daily.  Marland Kitchen GLIPIZIDE XL 5 MG 24 hr tablet Take 5 mg by mouth daily.  Marland Kitchen guaiFENesin (MUCINEX) 600 MG 12 hr tablet Take 600 mg by mouth 2 (two) times daily.  . hydrocortisone cream 0.5 % Apply 1 application topically 2 (two) times daily.  . hydrOXYzine (ATARAX/VISTARIL) 25 MG tablet Take 1 tablet (25 mg total) by mouth 3 (three) times daily as needed.  . lidocaine-prilocaine (EMLA) cream Apply a quarter size amount to affected area 1 hour prior to coming to chemotherapy. Cover with plastic wrap.  . metFORMIN (  GLUCOPHAGE) 500 MG tablet Take 500 mg by mouth 2 (two) times daily.  . metoprolol succinate (TOPROL-XL) 25 MG 24 hr tablet TAKE ONE TABLET BY MOUTH IN THE EVENING.  Marland Kitchen nystatin (MYCOSTATIN) 100000 UNIT/ML suspension Take 5 mLs (500,000 Units total) by mouth 4 (four) times daily.  . ondansetron (ZOFRAN) 8 MG tablet Take 8 mg by mouth every 8 (eight) hours as needed for nausea or vomiting.  Marland Kitchen oxyCODONE-acetaminophen (PERCOCET) 10-325 MG tablet   . Pembrolizumab (KEYTRUDA IV) Inject into the vein.  . potassium chloride (K-DUR,KLOR-CON) 10 MEQ tablet   . prochlorperazine (COMPAZINE) 10 MG tablet   . simvastatin (ZOCOR) 20 MG tablet Take 20 mg by mouth every evening.  . Skin Protectants, Misc. (EUCERIN) cream Apply topically as needed for dry skin.  Marland Kitchen spironolactone (ALDACTONE) 50 MG tablet Take 1 tablet (50 mg total) by mouth daily.  Marland Kitchen triamcinolone cream (KENALOG) 0.1 % Apply 1 application topically 2 (two) times daily.   No facility-administered encounter medications on file as of 02/05/2018.     ALLERGIES:  No Known Allergies   PHYSICAL EXAM:  ECOG Performance status: 1  Vitals:   02/05/18 0929  BP: 133/80  Pulse: 98  Resp: 20  Temp: (!) 97.5 F (36.4 C)  SpO2: 97%   Filed Weights   02/05/18 0929  Weight: 232 lb 3.2 oz (105.3 kg)    Physical  Exam  Constitutional: He is oriented to person, place, and time. He appears well-developed and well-nourished.  Cardiovascular: Normal rate, regular rhythm and normal heart sounds.  Pulmonary/Chest: Effort normal and breath sounds normal.  Musculoskeletal: Normal range of motion.  Neurological: He is alert and oriented to person, place, and time.  Skin: Skin is warm and dry.  Psychiatric: He has a normal mood and affect. His behavior is normal. Judgment and thought content normal.  Abdomen is soft nontender with no palpable abnormality.   LABORATORY DATA:  I have reviewed the labs as listed.  CBC    Component Value Date/Time   WBC 14.1 (H) 02/05/2018 0819   RBC 5.14 02/05/2018 0819   HGB 15.3 02/05/2018 0819   HGB 13.5 05/02/2016 0852   HCT 45.5 02/05/2018 0819   HCT 41.0 05/02/2016 0852   PLT 284 02/05/2018 0819   PLT 436 (H) 05/02/2016 0852   MCV 88.5 02/05/2018 0819   MCV 85.7 05/02/2016 0852   MCH 29.8 02/05/2018 0819   MCHC 33.6 02/05/2018 0819   RDW 14.3 02/05/2018 0819   RDW 13.9 05/02/2016 0852   LYMPHSABS 5.4 (H) 02/05/2018 0819   LYMPHSABS 3.4 (H) 05/02/2016 0852   MONOABS 1.3 (H) 02/05/2018 0819   MONOABS 1.2 (H) 05/02/2016 0852   EOSABS 0.7 02/05/2018 0819   EOSABS 1.6 (H) 05/02/2016 0852   BASOSABS 0.0 02/05/2018 0819   BASOSABS 0.2 (H) 05/02/2016 0852   CMP Latest Ref Rng & Units 02/05/2018 01/15/2018 12/25/2017  Glucose 70 - 99 mg/dL 136(H) 175(H) 160(H)  BUN 8 - 23 mg/dL _0 Creatinine 0.61 - 1.24 mg/dL 1.16 1.21 1.12  Sodium 135 - 145 mmol/L 140 142 140  Potassium 3.5 - 5.1 mmol/L 3.9 3.4(L) 3.8  Chloride 98 - 111 mmol/L 103 104 105  CO2 22 - 32 mmol/L _1 Calcium 8.9 - 10.3 mg/dL 9.1 8.9 8.8(L)  Total Protein 6.5 - 8.1 g/dL 7.6 7.1 7.0  Total Bilirubin 0.3 - 1.2 mg/dL 0.6 0.4 0.6  Alkaline Phos 38 - 126 U/L 90 85 85  AST 15 - 41 U/L _0 ALT 0 - 44 U/L _1 DIAGNOSTIC IMAGING:  I have reviewed images of the CT scan  dated 01/13/2018 and discussed with the patient.     ASSESSMENT & PLAN:   Primary cancer of right upper lobe of lung ([place]) 1.  Stage IV adenocarcinoma of the lung, PDL 1-50%, other actionable mutations negative: -Metastatic disease in the right upper lobe, mediastinal and hilar adenopathy, descending mesocolon, left upper buttock subcutaneous tissue, left upper lobe, right subscapularis muscle, left adrenal gland, right breast subcutaneous tissue - Keytruda started on 05/17/2016,  CT scan on 07/09/2017 showing decrease in size of lung nodules, 2 nodules in the serosa of the sigmoid colon, status post colonoscopy which showed submucosal 12 mm firm lesion in the sigmoid colon, no biopsies done. - He is tolerating Keytruda reasonably well.  He has mild diarrhea lasting about 2 to 3 days after each injection. -I have reviewed results of the CT scan of the chest abdomen and pelvis dated 01/13/2018 which showed similar disease burden in the chest, similar posterior right upper lobe lung mass and borderline mediastinal adenopathy with no progression.  No abdominal metastatic disease was seen. -I have recommended continuing Keytruda until progression.  I will see him back in 3 months with repeat CT scan. -he will receive flu vaccine today.  2.  Itching: He previously had itching and tried Atarax, doxepin and topical steroids.  This has gotten better.      Orders placed this encounter:  Orders Placed This Encounter  Procedures  . CT Chest W Contrast  . CT Abdomen Pelvis W Contrast  . Lactate dehydrogenase  . TSH  . CBC with Differential/Platelet  . Comprehensive metabolic panel      Derek Jack, MD [place] [phone]

## 2018-02-05 NOTE — Progress Notes (Signed)
Tolerated infusion w/o adverse reaction.  Alert, in no distress.  VSS.  Discharged ambulatory.  

## 2018-02-05 NOTE — Patient Instructions (Signed)
[facility] Discharge Instructions     Thank you for choosing [facility] at [facility] to provide your oncology and hematology care.  To afford each patient quality time with our provider, please arrive at least 15 minutes before your scheduled appointment time.   If you have a lab appointment with the Cancer Center please come in thru the  Main Entrance and check in at the main information desk  You need to re-schedule your appointment should you arrive 10 or more minutes late.  We strive to give you quality time with our providers, and arriving late affects you and other patients whose appointments are after yours.  Also, if you no show three or more times for appointments you may be dismissed from the clinic at the providers discretion.     Again, thank you for choosing [facility].  Our hope is that these requests will decrease the amount of time that you wait before being seen by our physicians.       _____________________________________________________________  Should you have questions after your visit to [facility], please contact our office at [phone] between the hours of 8:00 a.m. and 4:30 p.m.  Voicemails left after 4:00 p.m. will not be returned until the following business day.  For prescription refill requests, have your pharmacy contact our office and allow 72 hours.    Cancer Center Support Programs:   > Cancer Support Group  2nd Tuesday of the month 1pm-2pm, Journey Room    

## 2018-02-05 NOTE — Assessment & Plan Note (Signed)
1.  Stage IV adenocarcinoma of the lung, PDL 1-50%, other actionable mutations negative: -Metastatic disease in the right upper lobe, mediastinal and hilar adenopathy, descending mesocolon, left upper buttock subcutaneous tissue, left upper lobe, right subscapularis muscle, left adrenal gland, right breast subcutaneous tissue - Keytruda started on 05/17/2016,  CT scan on 07/09/2017 showing decrease in size of lung nodules, 2 nodules in the serosa of the sigmoid colon, status post colonoscopy which showed submucosal 12 mm firm lesion in the sigmoid colon, no biopsies done. - He is tolerating Keytruda reasonably well.  He has mild diarrhea lasting about 2 to 3 days after each injection. -I have reviewed results of the CT scan of the chest abdomen and pelvis dated 01/13/2018 which showed similar disease burden in the chest, similar posterior right upper lobe lung mass and borderline mediastinal adenopathy with no progression.  No abdominal metastatic disease was seen. -I have recommended continuing Keytruda until progression.  I will see him back in 3 months with repeat CT scan. -he will receive flu vaccine today.  2.  Itching: He previously had itching and tried Atarax, doxepin and topical steroids.  This has gotten better.

## 2018-02-26 ENCOUNTER — Inpatient Hospital Stay (HOSPITAL_COMMUNITY): Payer: Medicare Other

## 2018-02-26 VITALS — BP 167/86 | HR 55 | Temp 98.0°F | Resp 18 | Wt 231.2 lb

## 2018-02-26 DIAGNOSIS — K760 Fatty (change of) liver, not elsewhere classified: Secondary | ICD-10-CM | POA: Diagnosis not present

## 2018-02-26 DIAGNOSIS — E119 Type 2 diabetes mellitus without complications: Secondary | ICD-10-CM | POA: Diagnosis not present

## 2018-02-26 DIAGNOSIS — C3411 Malignant neoplasm of upper lobe, right bronchus or lung: Secondary | ICD-10-CM

## 2018-02-26 DIAGNOSIS — C499 Malignant neoplasm of connective and soft tissue, unspecified: Secondary | ICD-10-CM

## 2018-02-26 DIAGNOSIS — Z23 Encounter for immunization: Secondary | ICD-10-CM | POA: Diagnosis not present

## 2018-02-26 DIAGNOSIS — Z79899 Other long term (current) drug therapy: Secondary | ICD-10-CM | POA: Diagnosis not present

## 2018-02-26 DIAGNOSIS — I10 Essential (primary) hypertension: Secondary | ICD-10-CM | POA: Diagnosis not present

## 2018-02-26 DIAGNOSIS — Z86711 Personal history of pulmonary embolism: Secondary | ICD-10-CM | POA: Diagnosis not present

## 2018-02-26 DIAGNOSIS — Z5112 Encounter for antineoplastic immunotherapy: Secondary | ICD-10-CM | POA: Diagnosis not present

## 2018-02-26 DIAGNOSIS — C7801 Secondary malignant neoplasm of right lung: Secondary | ICD-10-CM | POA: Diagnosis not present

## 2018-02-26 DIAGNOSIS — Z7984 Long term (current) use of oral hypoglycemic drugs: Secondary | ICD-10-CM | POA: Diagnosis not present

## 2018-02-26 LAB — COMPREHENSIVE METABOLIC PANEL
ALT: 21 U/L (ref 0–44)
AST: 22 U/L (ref 15–41)
Albumin: 4.1 g/dL (ref 3.5–5.0)
Alkaline Phosphatase: 94 U/L (ref 38–126)
Anion gap: 7 (ref 5–15)
BUN: 15 mg/dL (ref 8–23)
CO2: 26 mmol/L (ref 22–32)
Calcium: 9.1 mg/dL (ref 8.9–10.3)
Chloride: 106 mmol/L (ref 98–111)
Creatinine, Ser: 1.18 mg/dL (ref 0.61–1.24)
GFR calc Af Amer: >60 mL/min (ref >60)
GFR calc non Af Amer: 59 mL/min — ABNORMAL LOW (ref >60)
Glucose, Bld: 168 mg/dL — ABNORMAL HIGH (ref 70–99)
Potassium: 4.1 mmol/L (ref 3.5–5.1)
Sodium: 139 mmol/L (ref 135–145)
Total Bilirubin: 0.4 mg/dL (ref 0.3–1.2)
Total Protein: 7.3 g/dL (ref 6.5–8.1)

## 2018-02-26 LAB — CBC WITH DIFFERENTIAL/PLATELET
Abs Immature Granulocytes: 0.02 10*3/uL (ref 0.00–0.07)
Basophils Absolute: 0.1 10*3/uL (ref 0.0–0.1)
Basophils Relative: 1 %
Eosinophils Absolute: 0.4 10*3/uL (ref 0.0–0.5)
Eosinophils Relative: 4 %
HCT: 47 % (ref 39.0–52.0)
Hemoglobin: 15.1 g/dL (ref 13.0–17.0)
Immature Granulocytes: 0 %
Lymphocytes Relative: 36 %
Lymphs Abs: 4.4 10*3/uL — ABNORMAL HIGH (ref 0.7–4.0)
MCH: 28.8 pg (ref 26.0–34.0)
MCHC: 32.1 g/dL (ref 30.0–36.0)
MCV: 89.7 fL (ref 80.0–100.0)
Monocytes Absolute: 0.9 10*3/uL (ref 0.1–1.0)
Monocytes Relative: 8 %
Neutro Abs: 6.2 10*3/uL (ref 1.7–7.7)
Neutrophils Relative %: 51 %
Platelets: 311 10*3/uL (ref 150–400)
RBC: 5.24 MIL/uL (ref 4.22–5.81)
RDW: 14.1 % (ref 11.5–15.5)
WBC: 12 10*3/uL — ABNORMAL HIGH (ref 4.0–10.5)
nRBC: 0 % (ref 0.0–0.2)

## 2018-02-26 LAB — TSH: TSH: 1.409 u[IU]/mL (ref 0.350–4.500)

## 2018-02-26 MED ORDER — HEPARIN SOD (PORK) LOCK FLUSH 100 UNIT/ML IV SOLN
500.0000 [IU] | Freq: Once | INTRAVENOUS | Status: AC | PRN
  Administered 2018-02-26: 500 [IU]

## 2018-02-26 MED ORDER — SODIUM CHLORIDE 0.9 % IV SOLN
Freq: Once | INTRAVENOUS | Status: AC
  Administered 2018-02-26: 13:00:00 via INTRAVENOUS

## 2018-02-26 MED ORDER — SODIUM CHLORIDE 0.9% FLUSH
10.0000 mL | INTRAVENOUS | Status: DC | PRN
  Administered 2018-02-26: 10 mL
  Filled 2018-02-26: qty 10

## 2018-02-26 MED ORDER — SODIUM CHLORIDE 0.9 % IV SOLN
200.0000 mg | Freq: Once | INTRAVENOUS | Status: AC
  Administered 2018-02-26: 200 mg via INTRAVENOUS
  Filled 2018-02-26: qty 8

## 2018-02-27 ENCOUNTER — Other Ambulatory Visit: Payer: Self-pay

## 2018-02-27 ENCOUNTER — Encounter (HOSPITAL_COMMUNITY): Payer: Self-pay

## 2018-02-27 NOTE — Progress Notes (Signed)
Treatment given per orders. Patient tolerated it well without problems. Vitals stable and discharged home from clinic ambulatory. Follow up as scheduled.  

## 2018-02-27 NOTE — Patient Instructions (Signed)
[facility] Discharge Instructions for Patients Receiving Chemotherapy   Beginning January 23rd 2017 lab work for the Cancer Center will be done in the  Main lab at [facility] on 1st floor. If you have a lab appointment with the Cancer Center please come in thru the  Main Entrance and check in at the main information desk   Today you received the following chemotherapy agents   To help prevent nausea and vomiting after your treatment, we encourage you to take your nausea medication     If you develop nausea and vomiting, or diarrhea that is not controlled by your medication, call the clinic.  The clinic phone number is [phone]. Office hours are Monday-Friday 8:30am-5:00pm.  BELOW ARE SYMPTOMS THAT SHOULD BE REPORTED IMMEDIATELY:  *FEVER GREATER THAN 101.0 F  *CHILLS WITH OR WITHOUT FEVER  NAUSEA AND VOMITING THAT IS NOT CONTROLLED WITH YOUR NAUSEA MEDICATION  *UNUSUAL SHORTNESS OF BREATH  *UNUSUAL BRUISING OR BLEEDING  TENDERNESS IN MOUTH AND THROAT WITH OR WITHOUT PRESENCE OF ULCERS  *URINARY PROBLEMS  *BOWEL PROBLEMS  UNUSUAL RASH Items with * indicate a potential emergency and should be followed up as soon as possible. If you have an emergency after office hours please contact your primary care physician or go to the nearest emergency department.  Please call the clinic during office hours if you have any questions or concerns.   You may also contact the Patient Navigator at [phone] should you have any questions or need assistance in obtaining follow up care.      Resources For Cancer Patients and their Caregivers ? American Cancer Society: Can assist with transportation, wigs, general needs, runs Look Good Feel Better.        1-888-227-6333 ? Cancer Care: Provides financial assistance, online support groups, medication/co-pay assistance.  1-800-813-HOPE (4673) ? [facility] Assists [place] cancer  patients and their families through emotional , educational and financial support.  336-427-4357 ? [place] DSS Where to apply for food stamps, Medicaid and utility assistance. 336-342-1394 ? [facility]: Transportation to medical appointments. 336-347-2287 ? Social Security Administration: May apply for disability if have a Stage IV cancer. 336-342-7796 1-800-772-1213 ? [place] Aging, Disability and Transit Services: Assists with nutrition, care and transit needs. 336-349-2343         

## 2018-03-19 ENCOUNTER — Encounter (HOSPITAL_COMMUNITY): Payer: Self-pay

## 2018-03-19 ENCOUNTER — Inpatient Hospital Stay (HOSPITAL_COMMUNITY): Payer: Medicare Other

## 2018-03-19 ENCOUNTER — Inpatient Hospital Stay (HOSPITAL_COMMUNITY): Payer: Medicare Other | Attending: Hematology

## 2018-03-19 ENCOUNTER — Other Ambulatory Visit: Payer: Self-pay

## 2018-03-19 VITALS — BP 148/78 | HR 89 | Temp 98.1°F | Resp 18

## 2018-03-19 DIAGNOSIS — C3411 Malignant neoplasm of upper lobe, right bronchus or lung: Secondary | ICD-10-CM | POA: Insufficient documentation

## 2018-03-19 DIAGNOSIS — C7801 Secondary malignant neoplasm of right lung: Secondary | ICD-10-CM | POA: Diagnosis not present

## 2018-03-19 DIAGNOSIS — C499 Malignant neoplasm of connective and soft tissue, unspecified: Secondary | ICD-10-CM

## 2018-03-19 DIAGNOSIS — Z5112 Encounter for antineoplastic immunotherapy: Secondary | ICD-10-CM | POA: Diagnosis not present

## 2018-03-19 DIAGNOSIS — Z79899 Other long term (current) drug therapy: Secondary | ICD-10-CM | POA: Diagnosis not present

## 2018-03-19 LAB — CBC WITH DIFFERENTIAL/PLATELET
Abs Immature Granulocytes: 0.05 10*3/uL (ref 0.00–0.07)
Basophils Absolute: 0.1 10*3/uL (ref 0.0–0.1)
Basophils Relative: 1 %
Eosinophils Absolute: 0.6 10*3/uL — ABNORMAL HIGH (ref 0.0–0.5)
Eosinophils Relative: 4 %
HCT: 46.1 % (ref 39.0–52.0)
Hemoglobin: 15 g/dL (ref 13.0–17.0)
Immature Granulocytes: 0 %
Lymphocytes Relative: 36 %
Lymphs Abs: 5.2 10*3/uL — ABNORMAL HIGH (ref 0.7–4.0)
MCH: 29.4 pg (ref 26.0–34.0)
MCHC: 32.5 g/dL (ref 30.0–36.0)
MCV: 90.2 fL (ref 80.0–100.0)
Monocytes Absolute: 1.1 10*3/uL — ABNORMAL HIGH (ref 0.1–1.0)
Monocytes Relative: 8 %
Neutro Abs: 7.4 10*3/uL (ref 1.7–7.7)
Neutrophils Relative %: 51 %
Platelets: 310 10*3/uL (ref 150–400)
RBC: 5.11 MIL/uL (ref 4.22–5.81)
RDW: 14 % (ref 11.5–15.5)
WBC: 14.4 10*3/uL — ABNORMAL HIGH (ref 4.0–10.5)
nRBC: 0 % (ref 0.0–0.2)

## 2018-03-19 LAB — COMPREHENSIVE METABOLIC PANEL
ALT: 19 U/L (ref 0–44)
AST: 20 U/L (ref 15–41)
Albumin: 4.1 g/dL (ref 3.5–5.0)
Alkaline Phosphatase: 81 U/L (ref 38–126)
Anion gap: 10 (ref 5–15)
BUN: 13 mg/dL (ref 8–23)
CO2: 25 mmol/L (ref 22–32)
Calcium: 9 mg/dL (ref 8.9–10.3)
Chloride: 104 mmol/L (ref 98–111)
Creatinine, Ser: 1.15 mg/dL (ref 0.61–1.24)
GFR calc Af Amer: >60 mL/min (ref >60)
GFR calc non Af Amer: >60 mL/min (ref >60)
Glucose, Bld: 92 mg/dL (ref 70–99)
Potassium: 3.9 mmol/L (ref 3.5–5.1)
Sodium: 139 mmol/L (ref 135–145)
Total Bilirubin: 0.7 mg/dL (ref 0.3–1.2)
Total Protein: 7.5 g/dL (ref 6.5–8.1)

## 2018-03-19 LAB — TSH: TSH: 2.551 u[IU]/mL (ref 0.350–4.500)

## 2018-03-19 MED ORDER — SODIUM CHLORIDE 0.9 % IV SOLN
Freq: Once | INTRAVENOUS | Status: AC
  Administered 2018-03-19: 14:00:00 via INTRAVENOUS

## 2018-03-19 MED ORDER — SODIUM CHLORIDE 0.9 % IV SOLN
200.0000 mg | Freq: Once | INTRAVENOUS | Status: AC
  Administered 2018-03-19: 200 mg via INTRAVENOUS
  Filled 2018-03-19: qty 8

## 2018-03-19 MED ORDER — SODIUM CHLORIDE 0.9% FLUSH
10.0000 mL | INTRAVENOUS | Status: DC | PRN
  Administered 2018-03-19: 10 mL
  Filled 2018-03-19: qty 10

## 2018-03-19 MED ORDER — HEPARIN SOD (PORK) LOCK FLUSH 100 UNIT/ML IV SOLN
500.0000 [IU] | Freq: Once | INTRAVENOUS | Status: AC | PRN
  Administered 2018-03-19: 500 [IU]

## 2018-03-19 NOTE — Progress Notes (Signed)
Pt here today for [facility] treatment. VSS. Labs reviewed with Dr. Delton Coombes . Proceed with treatment per MD. Appt schedule given to patient.   Treatment given today per MD orders. Tolerated infusion without adverse affects. Vital signs stable. No complaints at this time. Discharged from clinic ambulatory. F/U with [facility] as scheduled.

## 2018-03-19 NOTE — Patient Instructions (Signed)
[facility] Discharge Instructions for Patients Receiving Chemotherapy  Today you received the following chemotherapy agents  Keytruda   To help prevent nausea and vomiting after your treatment, we encourage you to take your nausea medication    If you develop nausea and vomiting that is not controlled by your nausea medication, call the clinic.   BELOW ARE SYMPTOMS THAT SHOULD BE REPORTED IMMEDIATELY:  *FEVER GREATER THAN 100.5 F  *CHILLS WITH OR WITHOUT FEVER  NAUSEA AND VOMITING THAT IS NOT CONTROLLED WITH YOUR NAUSEA MEDICATION  *UNUSUAL SHORTNESS OF BREATH  *UNUSUAL BRUISING OR BLEEDING  TENDERNESS IN MOUTH AND THROAT WITH OR WITHOUT PRESENCE OF ULCERS  *URINARY PROBLEMS  *BOWEL PROBLEMS  UNUSUAL RASH Items with * indicate a potential emergency and should be followed up as soon as possible.  Feel free to call the clinic should you have any questions or concerns. The clinic phone number is [phone].  Please show the [facility] at check-in to the Emergency Department and triage nurse.

## 2018-03-30 DIAGNOSIS — E119 Type 2 diabetes mellitus without complications: Secondary | ICD-10-CM | POA: Diagnosis not present

## 2018-03-30 DIAGNOSIS — E782 Mixed hyperlipidemia: Secondary | ICD-10-CM | POA: Diagnosis not present

## 2018-03-30 DIAGNOSIS — I10 Essential (primary) hypertension: Secondary | ICD-10-CM | POA: Diagnosis not present

## 2018-03-30 DIAGNOSIS — E1165 Type 2 diabetes mellitus with hyperglycemia: Secondary | ICD-10-CM | POA: Diagnosis not present

## 2018-04-01 DIAGNOSIS — E1165 Type 2 diabetes mellitus with hyperglycemia: Secondary | ICD-10-CM | POA: Diagnosis not present

## 2018-04-01 DIAGNOSIS — E669 Obesity, unspecified: Secondary | ICD-10-CM | POA: Diagnosis not present

## 2018-04-01 DIAGNOSIS — I10 Essential (primary) hypertension: Secondary | ICD-10-CM | POA: Diagnosis not present

## 2018-04-01 DIAGNOSIS — E782 Mixed hyperlipidemia: Secondary | ICD-10-CM | POA: Diagnosis not present

## 2018-04-09 ENCOUNTER — Other Ambulatory Visit: Payer: Self-pay

## 2018-04-09 ENCOUNTER — Encounter (HOSPITAL_COMMUNITY): Payer: Self-pay

## 2018-04-09 ENCOUNTER — Inpatient Hospital Stay (HOSPITAL_COMMUNITY): Payer: Medicare Other

## 2018-04-09 ENCOUNTER — Inpatient Hospital Stay (HOSPITAL_COMMUNITY): Payer: Medicare Other | Attending: Hematology

## 2018-04-09 VITALS — BP 144/83 | HR 70 | Temp 97.6°F | Resp 18 | Wt 230.6 lb

## 2018-04-09 DIAGNOSIS — C3411 Malignant neoplasm of upper lobe, right bronchus or lung: Secondary | ICD-10-CM | POA: Diagnosis not present

## 2018-04-09 DIAGNOSIS — Z5112 Encounter for antineoplastic immunotherapy: Secondary | ICD-10-CM | POA: Insufficient documentation

## 2018-04-09 DIAGNOSIS — C499 Malignant neoplasm of connective and soft tissue, unspecified: Secondary | ICD-10-CM

## 2018-04-09 DIAGNOSIS — Z79899 Other long term (current) drug therapy: Secondary | ICD-10-CM | POA: Diagnosis not present

## 2018-04-09 DIAGNOSIS — C7801 Secondary malignant neoplasm of right lung: Secondary | ICD-10-CM | POA: Diagnosis not present

## 2018-04-09 LAB — CBC WITH DIFFERENTIAL/PLATELET
Abs Immature Granulocytes: 0.03 10*3/uL (ref 0.00–0.07)
Basophils Absolute: 0.1 10*3/uL (ref 0.0–0.1)
Basophils Relative: 1 %
Eosinophils Absolute: 0.5 10*3/uL (ref 0.0–0.5)
Eosinophils Relative: 5 %
HCT: 46 % (ref 39.0–52.0)
Hemoglobin: 14.8 g/dL (ref 13.0–17.0)
Immature Granulocytes: 0 %
Lymphocytes Relative: 35 %
Lymphs Abs: 3.5 10*3/uL (ref 0.7–4.0)
MCH: 29.1 pg (ref 26.0–34.0)
MCHC: 32.2 g/dL (ref 30.0–36.0)
MCV: 90.4 fL (ref 80.0–100.0)
Monocytes Absolute: 0.7 10*3/uL (ref 0.1–1.0)
Monocytes Relative: 7 %
Neutro Abs: 5.3 10*3/uL (ref 1.7–7.7)
Neutrophils Relative %: 52 %
Platelets: 291 10*3/uL (ref 150–400)
RBC: 5.09 MIL/uL (ref 4.22–5.81)
RDW: 13.7 % (ref 11.5–15.5)
WBC: 10 10*3/uL (ref 4.0–10.5)
nRBC: 0 % (ref 0.0–0.2)

## 2018-04-09 LAB — COMPREHENSIVE METABOLIC PANEL
ALT: 18 U/L (ref 0–44)
AST: 19 U/L (ref 15–41)
Albumin: 4 g/dL (ref 3.5–5.0)
Alkaline Phosphatase: 80 U/L (ref 38–126)
Anion gap: 8 (ref 5–15)
BUN: 14 mg/dL (ref 8–23)
CO2: 23 mmol/L (ref 22–32)
Calcium: 8.8 mg/dL — ABNORMAL LOW (ref 8.9–10.3)
Chloride: 110 mmol/L (ref 98–111)
Creatinine, Ser: 1.19 mg/dL (ref 0.61–1.24)
GFR calc Af Amer: >60 mL/min (ref >60)
GFR calc non Af Amer: >60 mL/min (ref >60)
Glucose, Bld: 202 mg/dL — ABNORMAL HIGH (ref 70–99)
Potassium: 3.6 mmol/L (ref 3.5–5.1)
Sodium: 141 mmol/L (ref 135–145)
Total Bilirubin: 0.3 mg/dL (ref 0.3–1.2)
Total Protein: 7.2 g/dL (ref 6.5–8.1)

## 2018-04-09 LAB — TSH: TSH: 1.181 u[IU]/mL (ref 0.350–4.500)

## 2018-04-09 MED ORDER — SODIUM CHLORIDE 0.9 % IV SOLN
200.0000 mg | Freq: Once | INTRAVENOUS | Status: AC
  Administered 2018-04-09: 200 mg via INTRAVENOUS
  Filled 2018-04-09: qty 8

## 2018-04-09 MED ORDER — SODIUM CHLORIDE 0.9 % IV SOLN
Freq: Once | INTRAVENOUS | Status: AC
  Administered 2018-04-09: 14:00:00 via INTRAVENOUS

## 2018-04-09 MED ORDER — HEPARIN SOD (PORK) LOCK FLUSH 100 UNIT/ML IV SOLN
500.0000 [IU] | Freq: Once | INTRAVENOUS | Status: AC | PRN
  Administered 2018-04-09: 500 [IU]

## 2018-04-09 NOTE — Progress Notes (Signed)
Tolerated infusion w/o adverse reaction.  Alert, in no distress.  VSS.  Discharged ambulatory.  

## 2018-04-30 ENCOUNTER — Other Ambulatory Visit (HOSPITAL_COMMUNITY): Payer: Medicare Other

## 2018-04-30 ENCOUNTER — Ambulatory Visit (HOSPITAL_COMMUNITY): Payer: Medicare Other

## 2018-05-01 ENCOUNTER — Inpatient Hospital Stay (HOSPITAL_COMMUNITY): Payer: Medicare Other | Attending: Internal Medicine

## 2018-05-01 ENCOUNTER — Inpatient Hospital Stay (HOSPITAL_COMMUNITY): Payer: Medicare Other

## 2018-05-01 ENCOUNTER — Encounter (HOSPITAL_COMMUNITY): Payer: Self-pay

## 2018-05-01 ENCOUNTER — Other Ambulatory Visit (HOSPITAL_COMMUNITY): Payer: Medicare Other

## 2018-05-01 VITALS — BP 145/80 | HR 81 | Temp 97.6°F | Resp 18 | Wt 229.8 lb

## 2018-05-01 DIAGNOSIS — Z5112 Encounter for antineoplastic immunotherapy: Secondary | ICD-10-CM | POA: Insufficient documentation

## 2018-05-01 DIAGNOSIS — C499 Malignant neoplasm of connective and soft tissue, unspecified: Secondary | ICD-10-CM

## 2018-05-01 DIAGNOSIS — Z79899 Other long term (current) drug therapy: Secondary | ICD-10-CM | POA: Diagnosis not present

## 2018-05-01 DIAGNOSIS — C3411 Malignant neoplasm of upper lobe, right bronchus or lung: Secondary | ICD-10-CM | POA: Diagnosis not present

## 2018-05-01 DIAGNOSIS — C7801 Secondary malignant neoplasm of right lung: Secondary | ICD-10-CM | POA: Diagnosis not present

## 2018-05-01 LAB — COMPREHENSIVE METABOLIC PANEL
ALT: 22 U/L (ref 0–44)
AST: 21 U/L (ref 15–41)
Albumin: 4.3 g/dL (ref 3.5–5.0)
Alkaline Phosphatase: 73 U/L (ref 38–126)
Anion gap: 10 (ref 5–15)
BUN: 13 mg/dL (ref 8–23)
CO2: 26 mmol/L (ref 22–32)
Calcium: 8.8 mg/dL — ABNORMAL LOW (ref 8.9–10.3)
Chloride: 104 mmol/L (ref 98–111)
Creatinine, Ser: 1.26 mg/dL — ABNORMAL HIGH (ref 0.61–1.24)
GFR calc Af Amer: >60 mL/min (ref >60)
GFR calc non Af Amer: 56 mL/min — ABNORMAL LOW (ref >60)
Glucose, Bld: 139 mg/dL — ABNORMAL HIGH (ref 70–99)
Potassium: 3.8 mmol/L (ref 3.5–5.1)
Sodium: 140 mmol/L (ref 135–145)
Total Bilirubin: 0.8 mg/dL (ref 0.3–1.2)
Total Protein: 7.6 g/dL (ref 6.5–8.1)

## 2018-05-01 LAB — CBC WITH DIFFERENTIAL/PLATELET
Abs Immature Granulocytes: 0.04 10*3/uL (ref 0.00–0.07)
Basophils Absolute: 0.1 10*3/uL (ref 0.0–0.1)
Basophils Relative: 1 %
Eosinophils Absolute: 0.7 10*3/uL — ABNORMAL HIGH (ref 0.0–0.5)
Eosinophils Relative: 5 %
HCT: 48.2 % (ref 39.0–52.0)
Hemoglobin: 15.7 g/dL (ref 13.0–17.0)
Immature Granulocytes: 0 %
Lymphocytes Relative: 40 %
Lymphs Abs: 5.2 10*3/uL — ABNORMAL HIGH (ref 0.7–4.0)
MCH: 29.4 pg (ref 26.0–34.0)
MCHC: 32.6 g/dL (ref 30.0–36.0)
MCV: 90.3 fL (ref 80.0–100.0)
Monocytes Absolute: 0.9 10*3/uL (ref 0.1–1.0)
Monocytes Relative: 7 %
Neutro Abs: 6.2 10*3/uL (ref 1.7–7.7)
Neutrophils Relative %: 47 %
Platelets: 309 10*3/uL (ref 150–400)
RBC: 5.34 MIL/uL (ref 4.22–5.81)
RDW: 13.3 % (ref 11.5–15.5)
WBC: 13.1 10*3/uL — ABNORMAL HIGH (ref 4.0–10.5)
nRBC: 0 % (ref 0.0–0.2)

## 2018-05-01 LAB — TSH: TSH: 1.692 u[IU]/mL (ref 0.350–4.500)

## 2018-05-01 MED ORDER — HEPARIN SOD (PORK) LOCK FLUSH 100 UNIT/ML IV SOLN
500.0000 [IU] | Freq: Once | INTRAVENOUS | Status: AC | PRN
  Administered 2018-05-01: 500 [IU]

## 2018-05-01 MED ORDER — SODIUM CHLORIDE 0.9% FLUSH
10.0000 mL | INTRAVENOUS | Status: DC | PRN
  Administered 2018-05-01: 10 mL
  Filled 2018-05-01: qty 10

## 2018-05-01 MED ORDER — SODIUM CHLORIDE 0.9 % IV SOLN
200.0000 mg | Freq: Once | INTRAVENOUS | Status: AC
  Administered 2018-05-01: 200 mg via INTRAVENOUS
  Filled 2018-05-01: qty 8

## 2018-05-01 MED ORDER — SODIUM CHLORIDE 0.9 % IV SOLN
Freq: Once | INTRAVENOUS | Status: AC
  Administered 2018-05-01: 14:00:00 via INTRAVENOUS

## 2018-05-01 NOTE — Patient Instructions (Signed)
[facility] Discharge Instructions for Patients Receiving Chemotherapy   Beginning January 23rd 2017 lab work for the [facility] will be done in the  Main lab at [facility] on 1st floor. If you have a lab appointment with the [facility] please come in thru the  Main Entrance and check in at the main information desk   Today you received the following chemotherapy agents Keytruda. Follow-up as scheduled. Call clinic for any questions or concerns  To help prevent nausea and vomiting after your treatment, we encourage you to take your nausea medication   If you develop nausea and vomiting, or diarrhea that is not controlled by your medication, call the clinic.  The clinic phone number is [phone]. Office hours are Monday-Friday 8:30am-5:00pm.  BELOW ARE SYMPTOMS THAT SHOULD BE REPORTED IMMEDIATELY:  *FEVER GREATER THAN 101.0 F  *CHILLS WITH OR WITHOUT FEVER  NAUSEA AND VOMITING THAT IS NOT CONTROLLED WITH YOUR NAUSEA MEDICATION  *UNUSUAL SHORTNESS OF BREATH  *UNUSUAL BRUISING OR BLEEDING  TENDERNESS IN MOUTH AND THROAT WITH OR WITHOUT PRESENCE OF ULCERS  *URINARY PROBLEMS  *BOWEL PROBLEMS  UNUSUAL RASH Items with * indicate a potential emergency and should be followed up as soon as possible. If you have an emergency after office hours please contact your primary care physician or go to the nearest emergency department.  Please call the clinic during office hours if you have any questions or concerns.   You may also contact the Patient Navigator at [phone] should you have any questions or need assistance in obtaining follow up care.      Resources For Cancer Patients and their Caregivers ? American Cancer Society: Can assist with transportation, wigs, general needs, runs Look Good Feel Better.        234 315 4608 ? Cancer Care: Provides financial assistance, online support groups, medication/co-pay assistance.  1-800-813-HOPE  819-051-1979) ? [place] Assists [place] cancer patients and their families through emotional , educational and financial support.  (607)527-4372 ? [place] DSS Where to apply for food stamps, Medicaid and utility assistance. 6190971692 ? RCATS: Transportation to medical appointments. 757-084-2137 ? Social Security Administration: May apply for disability if have a Stage IV cancer. 670-082-0259 (854) 108-6042 ? [employer], Disability and Transit Services: Assists with nutrition, care and transit needs. ([phone]

## 2018-05-01 NOTE — Progress Notes (Signed)
Darrell Christensen tolerated Keytruda infusion well without complaints or incident. Labs reviewed prior to administering this medication. VSS upon discharge. Pt discharged self ambulatory in satisfactory condition

## 2018-05-21 ENCOUNTER — Ambulatory Visit (HOSPITAL_COMMUNITY): Payer: Medicare Other

## 2018-05-21 ENCOUNTER — Other Ambulatory Visit (HOSPITAL_COMMUNITY): Payer: Medicare Other

## 2018-05-21 ENCOUNTER — Ambulatory Visit (HOSPITAL_COMMUNITY): Payer: Medicare Other | Admitting: Hematology

## 2018-05-22 ENCOUNTER — Inpatient Hospital Stay (HOSPITAL_COMMUNITY): Payer: Medicare Other | Attending: Nurse Practitioner

## 2018-05-22 ENCOUNTER — Inpatient Hospital Stay (HOSPITAL_COMMUNITY): Payer: Medicare Other | Admitting: Hematology

## 2018-05-22 ENCOUNTER — Encounter (HOSPITAL_COMMUNITY): Payer: Self-pay | Admitting: Hematology

## 2018-05-22 ENCOUNTER — Inpatient Hospital Stay (HOSPITAL_COMMUNITY): Payer: Medicare Other

## 2018-05-22 VITALS — BP 140/75 | HR 78 | Temp 97.8°F | Resp 18

## 2018-05-22 DIAGNOSIS — C785 Secondary malignant neoplasm of large intestine and rectum: Secondary | ICD-10-CM | POA: Insufficient documentation

## 2018-05-22 DIAGNOSIS — C7802 Secondary malignant neoplasm of left lung: Secondary | ICD-10-CM

## 2018-05-22 DIAGNOSIS — F17210 Nicotine dependence, cigarettes, uncomplicated: Secondary | ICD-10-CM | POA: Insufficient documentation

## 2018-05-22 DIAGNOSIS — L299 Pruritus, unspecified: Secondary | ICD-10-CM | POA: Diagnosis not present

## 2018-05-22 DIAGNOSIS — Z5112 Encounter for antineoplastic immunotherapy: Secondary | ICD-10-CM | POA: Diagnosis not present

## 2018-05-22 DIAGNOSIS — C3411 Malignant neoplasm of upper lobe, right bronchus or lung: Secondary | ICD-10-CM | POA: Diagnosis not present

## 2018-05-22 LAB — COMPREHENSIVE METABOLIC PANEL
ALT: 19 U/L (ref 0–44)
AST: 21 U/L (ref 15–41)
Albumin: 4 g/dL (ref 3.5–5.0)
Alkaline Phosphatase: 76 U/L (ref 38–126)
Anion gap: 10 (ref 5–15)
BUN: 12 mg/dL (ref 8–23)
CO2: 25 mmol/L (ref 22–32)
Calcium: 8.9 mg/dL (ref 8.9–10.3)
Chloride: 101 mmol/L (ref 98–111)
Creatinine, Ser: 1.14 mg/dL (ref 0.61–1.24)
GFR calc Af Amer: >60 mL/min (ref >60)
GFR calc non Af Amer: >60 mL/min (ref >60)
Glucose, Bld: 213 mg/dL — ABNORMAL HIGH (ref 70–99)
Potassium: 3.5 mmol/L (ref 3.5–5.1)
Sodium: 136 mmol/L (ref 135–145)
Total Bilirubin: 0.5 mg/dL (ref 0.3–1.2)
Total Protein: 7.1 g/dL (ref 6.5–8.1)

## 2018-05-22 LAB — CBC WITH DIFFERENTIAL/PLATELET
Abs Immature Granulocytes: 0.04 10*3/uL (ref 0.00–0.07)
Basophils Absolute: 0.1 10*3/uL (ref 0.0–0.1)
Basophils Relative: 1 %
Eosinophils Absolute: 0.7 10*3/uL — ABNORMAL HIGH (ref 0.0–0.5)
Eosinophils Relative: 6 %
HCT: 47.6 % (ref 39.0–52.0)
Hemoglobin: 15.3 g/dL (ref 13.0–17.0)
Immature Granulocytes: 0 %
Lymphocytes Relative: 37 %
Lymphs Abs: 4.2 10*3/uL — ABNORMAL HIGH (ref 0.7–4.0)
MCH: 29.5 pg (ref 26.0–34.0)
MCHC: 32.1 g/dL (ref 30.0–36.0)
MCV: 91.9 fL (ref 80.0–100.0)
Monocytes Absolute: 0.7 10*3/uL (ref 0.1–1.0)
Monocytes Relative: 6 %
Neutro Abs: 5.6 10*3/uL (ref 1.7–7.7)
Neutrophils Relative %: 50 %
Platelets: 278 10*3/uL (ref 150–400)
RBC: 5.18 MIL/uL (ref 4.22–5.81)
RDW: 13 % (ref 11.5–15.5)
WBC: 11.3 10*3/uL — ABNORMAL HIGH (ref 4.0–10.5)
nRBC: 0 % (ref 0.0–0.2)

## 2018-05-22 LAB — LACTATE DEHYDROGENASE: LDH: 159 U/L (ref 98–192)

## 2018-05-22 LAB — TSH: TSH: 3.783 u[IU]/mL (ref 0.350–4.500)

## 2018-05-22 MED ORDER — SODIUM CHLORIDE 0.9% FLUSH
10.0000 mL | INTRAVENOUS | Status: DC | PRN
  Administered 2018-05-22: 10 mL
  Filled 2018-05-22: qty 10

## 2018-05-22 MED ORDER — SODIUM CHLORIDE 0.9 % IV SOLN
200.0000 mg | Freq: Once | INTRAVENOUS | Status: AC
  Administered 2018-05-22: 200 mg via INTRAVENOUS
  Filled 2018-05-22: qty 8

## 2018-05-22 MED ORDER — SODIUM CHLORIDE 0.9 % IV SOLN
Freq: Once | INTRAVENOUS | Status: AC
  Administered 2018-05-22: 12:00:00 via INTRAVENOUS

## 2018-05-22 MED ORDER — HEPARIN SOD (PORK) LOCK FLUSH 100 UNIT/ML IV SOLN
500.0000 [IU] | Freq: Once | INTRAVENOUS | Status: AC | PRN
  Administered 2018-05-22: 500 [IU]

## 2018-05-22 NOTE — Progress Notes (Signed)
[place], [place] [address]   CLINIC:  Medical Oncology/Hematology  PCP:  Celene Squibb, MD [place] [address] [phone]   REASON FOR VISIT: Follow-up for stage IV adenocarcinoma of the right lung  CURRENT THERAPY: keytruda  BRIEF ONCOLOGIC HISTORY:    Primary cancer of right upper lobe of lung ([facility])   05/14/2015 Procedure    Port placement by Dr. Arnoldo Morale    04/01/2016 Initial Diagnosis    Primary cancer of right upper lobe of lung ([facility])    04/02/2016 Procedure    Ultrasound-guided core biopsy performed of a solid 1.5 cm soft tissue nodule in the right chest wall.    04/03/2016 Imaging    MRI brain- No acute and intracranial process or metastasis.  Moderate chronic small vessel ischemic disease.    04/04/2016 Pathology Results    Diagnosis Soft Tissue Needle Core Biopsy, Right Chest Wall SMOOTH MUSCLE NEOPLASM Microscopic Comment The differential diagnosis include low grade leiomyosarcoma. The neoplasm shows spindle cell morphology with rare mitotic figures and minimal atypia, no necrosis present. These cells stains positive for smooth muscle actin, desmin, negative for cytokeratin AE1&3, ck8/18, s100, cd117, cd 34 and cd99. This case also reviewed by Dr. [name] and agree.    04/05/2016 Procedure    CT-guided biopsy of right upper lobe lesion, with tissue specimen sent to pathology, by IR.    04/09/2016 Pathology Results    Lung, needle/core biopsy(ies), Right Upper Lobe - NON SMALL CELL CARCINOMA.    04/17/2016 Pathology Results    PDL1 High Expression.  TPS 50%.    04/24/2016 PET scan    1. Prominently hypermetabolic large right upper lobe mass with metastatic disease to the right paratracheal and right hilar nodal chains, to the descending mesocolon, to the left upper buttock subcutaneous tissues, to the left upper lobe, to the right subscapularis muscle, and possibly to the left adrenal  gland and right breast subcutaneous tissues. Appearance compatible with stage IV lung cancer. 2. There is some focal high activity in the ascending colon which is probably from peristalsis, less likely from a local colon mass. This does raise the possibility that the adjacent mesocolon tumor implant could be due to synchronous colon cancer rather than lung metastasis. 3. Other imaging findings of potential clinical significance: Coronary, aortic arch, and branch vessel atherosclerotic vascular disease. Aortoiliac atherosclerotic vascular disease. Deformity left proximal humerus and left glenohumeral joint from prior trauma. Enlarged prostate gland.    04/26/2016 Pathology Results    FoundationONE: Genomic alterations identified- KRAS G95A, CREBBP splice site [id number]>[id number], LRP1B S515*, HQI69 G29*, BMW41 splice site [id number], SPTA1 splice site [id number]>[id number], TP53 C135Y.  Additional findings- MS-Stable, TMB-intermediate (14 Muts/Mb).  No reportable alterations- EGFR, ALK, BRAF, MET, ERBB2, RET, ROS1.    05/17/2016 -  Chemotherapy    The patient had ONDANSETRON IVPB [facility] +/- DEXAMETHASONE, , Intravenous,  Once, 0 of 1 cycle  pembrolizumab (KEYTRUDA) 200 mg in sodium chloride 0.9 % 50 mL chemo infusion, 200 mg, Intravenous, Once, 0 of 5 cycles  for chemotherapy treatment.      08/06/2016 PET scan    IMPRESSION: 1. Overall considerable improvement. The right upper lobe mass is markedly reduced in volume and SUV. Thoracic adenopathy have resolved and the hypermetabolic lesion in the right subscapularis muscle has also resolved. 2. The left upper lobe nodule is no longer appreciably hypermetabolic, and is highly indistinct although this may be due to motion artifact.  Faint in indistinct nodularity elsewhere in the lungs likewise not currently hypermetabolic. 3. There is some very faint residual low-grade activity along the subcutaneous deposit along the upper left buttock region.  Marked reduction in size and activity of the tumor deposit adjacent to the descending colon. 4. Prior right pleural effusion has resolved. 5. Stable appearance of the adrenal glands, with low-level activity and a nodule in the left adrenal gland. 6. Stable small nodule in the right breast, low-grade metabolic activity with maximum SUV 2.2 (formerly 3.0) 7. Other imaging findings of potential clinical significance: Severe arthropathy of the left glenohumeral joint. Coronary, aortic arch, and branch vessel atherosclerotic vascular disease. Aortoiliac atherosclerotic vascular disease. Prominent prostate gland indents the bladder base.      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  Two adjacent large masslike areas of consolidation within the right upper lobe with differential considerations including malignancy or pneumonia. Multiple enlarged right hilar and mediastinal lymph nodes which may be reactive or metastatic in etiology.  Additional indeterminate pulmonary nodules as above. Recommend attention on follow-up.  Indeterminate 2.9 cm left adrenal nodule. This needs dedicated evaluation with pre and post contrast-enhanced CT or MRI after confirmation of pulmonary process.  Indeterminate nodule within the right breast. Recommend dedicated evaluation with mammography.  Hepatic steatosis.    04/02/2016 Procedure    Ultrasound-guided core biopsy performed of a solid 1.5 cm soft tissue nodule in the right chest wall.    04/04/2016 Pathology Results    Diagnosis Soft Tissue Needle Core Biopsy, Right Chest Wall SMOOTH MUSCLE NEOPLASM Microscopic Comment The differential diagnosis include low grade leiomyosarcoma. The neoplasm shows spindle cell morphology with rare mitotic figures and minimal atypia, no necrosis present. These cells  stains positive for smooth muscle actin, desmin, negative for cytokeratin AE1&3, ck8/18, s100, cd117, cd 34 and cd99. This case also reviewed by Dr. [name] and agree.       CANCER STAGING: Cancer Staging Primary cancer of right upper lobe of lung ([facility]) Staging form: Lung, AJCC 8th Edition - Clinical: Stage IVB (cT3, cN2, cM1c) - Signed by Baird Cancer, PA-C on 05/07/2016    INTERVAL HISTORY:  Darrell Christensen 73 y.o. male returns for routine follow-up for stage IV adenocarcinoma of the right lung. He is tolerating treatment well. He is still having itching at night time. Denies any nausea, vomiting, or diarrhea. Denies any new pains. Had not noticed any recent bleeding such as epistaxis, hematuria or hematochezia. Denies recent chest pain on exertion, shortness of breath on minimal exertion, pre-syncopal episodes, or palpitations. Denies any numbness or tingling in hands or feet. Denies any recent fevers, infections, or recent hospitalizations. Patient reports appetite at 75% and energy level at 50%.   REVIEW OF SYSTEMS:  Review of Systems  Skin: Positive for itching.  All other systems reviewed and are negative.    PAST MEDICAL/SURGICAL HISTORY:  Past Medical History:  Diagnosis Date  . Adrenal mass, left ([facility]) 04/01/2016  . Diabetes mellitus ([facility]) 05/02/2016  . Diabetes mellitus without complication ([facility])   . Hypertension   . Mass of breast, right 04/01/2016  . Mass of upper lobe of right lung 04/01/2016   2 adjacent, 5 cm masses, hilar adenopathy  . Primary cancer of right upper lobe of lung ([facility]) 04/01/2016   2 adjacent, 5 cm masses, hilar adenopathy  . Pulmonary embolus ([facility]) 03/31/2016  .  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 COLONOSCOPY N/A 07/28/2017   Procedure: COLONOSCOPY;  Surgeon: Rogene Houston, MD;  Location: [facility] ENDO SUITE;  Service: Endoscopy;  Laterality: N/A;  205  . PORTACATH PLACEMENT Left 05/13/2016    Procedure: INSERTION PORT-A-CATH;  Surgeon: Aviva Signs, MD;  Location: [facility] ORS;  Service: General;  Laterality: Left;     SOCIAL HISTORY:  Social History   Socioeconomic History  . Marital status: Divorced    Spouse name: Not on file  . Number of children: Not on file  . Years of education: Not on file  . Highest education level: Not on file  Occupational History  . Not on file  Social Needs  . Financial resource strain: Not on file  . Food insecurity:    Worry: Not on file    Inability: Not on file  . Transportation needs:    Medical: Not on file    Non-medical: Not on file  Tobacco Use  . Smoking status: Current Every Day Smoker    Years: 61.00    Types: E-cigarettes    Last attempt to quit: 04/11/2014    Years since quitting: 4.1  . Smokeless tobacco: Never Used  . Tobacco comment: patient does vape smoking x 2 years  Substance and Sexual Activity  . Alcohol use: No    Comment: quit 10 years  . Drug use: No  . Sexual activity: Not on file  Lifestyle  . Physical activity:    Days per week: Not on file    Minutes per session: Not on file  . Stress: Not on file  Relationships  . Social connections:    Talks on phone: Not on file    Gets together: Not on file    Attends religious service: Not on file    Active member of club or organization: Not on file    Attends meetings of clubs or organizations: Not on file    Relationship status: Not on file  . Intimate partner violence:    Fear of current or ex partner: Not on file    Emotionally abused: Not on file    Physically abused: Not on file    Forced sexual activity: Not on file  Other Topics Concern  . Not on file  Social History Narrative  . Not on file    FAMILY HISTORY:  Family History  Problem Relation Age of Onset  . Stroke Mother   . Heart attack Father   . Heart attack Brother     CURRENT MEDICATIONS:  Outpatient Encounter Medications as of 05/22/2018  Medication Sig  . ALPRAZolam (XANAX) 1  MG tablet Take 1 mg by mouth 3 (three) times daily as needed for anxiety.  Marland Kitchen amLODipine (NORVASC) 2.5 MG tablet Take 2.5 mg by mouth daily.  . furosemide (LASIX) 20 MG tablet Take 2 tablets (40 mg total) by mouth daily.  Marland Kitchen GLIPIZIDE XL 5 MG 24 hr tablet Take 5 mg by mouth daily.  . hydrocortisone cream 0.5 % Apply 1 application topically 2 (two) times daily.  Marland Kitchen lidocaine-prilocaine (EMLA) cream Apply a quarter size amount to affected area 1 hour prior to coming to chemotherapy. Cover with plastic wrap.  . metFORMIN (GLUCOPHAGE) 500 MG tablet Take 500 mg by mouth 2 (two) times daily.  . metoprolol succinate (TOPROL-XL) 25 MG 24 hr tablet TAKE ONE TABLET BY MOUTH IN THE EVENING.  Marland Kitchen oxyCODONE-acetaminophen (PERCOCET) 10-325 MG tablet   .  Pembrolizumab (KEYTRUDA IV) Inject into the vein.  . potassium chloride (K-DUR,KLOR-CON) 10 MEQ tablet   . prochlorperazine (COMPAZINE) 10 MG tablet   . simvastatin (ZOCOR) 20 MG tablet Take 20 mg by mouth every evening.  . Skin Protectants, Misc. (EUCERIN) cream Apply topically as needed for dry skin.  Marland Kitchen spironolactone (ALDACTONE) 50 MG tablet Take 1 tablet (50 mg total) by mouth daily.  Marland Kitchen triamcinolone cream (KENALOG) 0.1 % Apply 1 application topically 2 (two) times daily.  . [DISCONTINUED] guaiFENesin (MUCINEX) 600 MG 12 hr tablet Take 600 mg by mouth 2 (two) times daily.  . [DISCONTINUED] nystatin (MYCOSTATIN) 100000 UNIT/ML suspension Take 5 mLs (500,000 Units total) by mouth 4 (four) times daily.  . diphenoxylate-atropine (LOMOTIL) 2.5-0.025 MG tablet Take 1 tablet 4 (four) times daily as needed by mouth for diarrhea or loose stools. (Patient not taking: Reported on 05/22/2018)  . hydrOXYzine (ATARAX/VISTARIL) 25 MG tablet Take 1 tablet (25 mg total) by mouth 3 (three) times daily as needed. (Patient not taking: Reported on 05/22/2018)  . ondansetron (ZOFRAN) 8 MG tablet Take 8 mg by mouth every 8 (eight) hours as needed for nausea or vomiting.   No  facility-administered encounter medications on file as of 05/22/2018.     ALLERGIES:  No Known Allergies   PHYSICAL EXAM:  ECOG Performance status: 1  VITAL SIGNS:BP166/92, P:49, R:16, T:97.5, O2:95% Weight: 229  Physical Exam Constitutional:      Appearance: Normal appearance. He is normal weight.  Cardiovascular:     Rate and Rhythm: Normal rate and regular rhythm.     Heart sounds: Normal heart sounds.  Pulmonary:     Effort: Pulmonary effort is normal.     Breath sounds: Normal breath sounds.  Musculoskeletal: Normal range of motion.  Skin:    General: Skin is warm and dry.  Neurological:     Mental Status: He is alert and oriented to person, place, and time. Mental status is at baseline.  Psychiatric:        Mood and Affect: Mood normal.        Behavior: Behavior normal.        Thought Content: Thought content normal.        Judgment: Judgment normal.      LABORATORY DATA:  I have reviewed the labs as listed.  CBC    Component Value Date/Time   WBC 11.3 (H) 05/22/2018 0929   RBC 5.18 05/22/2018 0929   HGB 15.3 05/22/2018 0929   HGB 13.5 05/02/2016 0852   HCT 47.6 05/22/2018 0929   HCT 41.0 05/02/2016 0852   PLT 278 05/22/2018 0929   PLT 436 (H) 05/02/2016 0852   MCV 91.9 05/22/2018 0929   MCV 85.7 05/02/2016 0852   MCH 29.5 05/22/2018 0929   MCHC 32.1 05/22/2018 0929   RDW 13.0 05/22/2018 0929   RDW 13.9 05/02/2016 0852   LYMPHSABS 4.2 (H) 05/22/2018 0929   LYMPHSABS 3.4 (H) 05/02/2016 0852   MONOABS 0.7 05/22/2018 0929   MONOABS 1.2 (H) 05/02/2016 0852   EOSABS 0.7 (H) 05/22/2018 0929   EOSABS 1.6 (H) 05/02/2016 0852   BASOSABS 0.1 05/22/2018 0929   BASOSABS 0.2 (H) 05/02/2016 0852   CMP Latest Ref Rng & Units 05/22/2018 05/01/2018 04/09/2018  Glucose 70 - 99 mg/dL 213(H) 139(H) 202(H)  BUN 8 - 23 mg/dL '12 13 14  ' Creatinine 0.61 - 1.24 mg/dL 1.14 1.26(H) 1.19  Sodium 135 - 145 mmol/L 136 140 141  Potassium 3.5 - 5.1 mmol/L  3.5 3.8 3.6  Chloride  98 - 111 mmol/L 101 104 110  CO2 22 - 32 mmol/L '25 26 23  ' Calcium 8.9 - 10.3 mg/dL 8.9 8.8(L) 8.8(L)  Total Protein 6.5 - 8.1 g/dL 7.1 7.6 7.2  Total Bilirubin 0.3 - 1.2 mg/dL 0.5 0.8 0.3  Alkaline Phos 38 - 126 U/L 76 73 80  AST 15 - 41 U/L '21 21 19  ' ALT 0 - 44 U/L '19 22 18       ' DIAGNOSTIC IMAGING:  I have independently reviewed the scans and discussed with the patient.   I have reviewed Francene Finders, NP's note and agree with the documentation.  I personally performed a face-to-face visit, made revisions and my assessment and plan is as follows.    ASSESSMENT & PLAN:   Primary cancer of right upper lobe of lung ([facility]) 1.  Stage IV adenocarcinoma of the lung, PDL 1-50%, other actionable mutations negative: -Metastatic disease in the right upper lobe, mediastinal and hilar adenopathy, descending mesocolon, left upper buttock subcutaneous tissue, left upper lobe, right subscapularis muscle, left adrenal gland, right breast subcutaneous tissue - Keytruda started on 05/17/2016,  CT scan on 07/09/2017 showing decrease in size of lung nodules, 2 nodules in the serosa of the sigmoid colon, status post colonoscopy which showed submucosal 12 mm firm lesion in the sigmoid colon, no biopsies done. - He is tolerating Keytruda reasonably well.  He has mild diarrhea lasting about 2 to 3 days after each injection. -CT CAP on 01/13/2018 showed similar disease burden in the chest, similar posterior right upper lobe lung mass, and borderline mediastinal adenopathy with no progression.  No abdominal metastatic disease was seen. -He will continue Keytruda until progression.  He missed CT scans. -I have reviewed his blood work today.  They are grossly within normal limits.  We will set up CT CAP in 3 weeks.   2.  Itching: -He complains of chronic itching on his back for the last 2 years. -He previously used Atarax, doxepin and topical steroids.  I have offered for a dermatology referral which she declined.         Orders placed this encounter:  Orders Placed This Encounter  Procedures  . CT CHEST W CONTRAST  . CT Abdomen Pelvis W Contrast  . CBC with Differential/Platelet  . Comprehensive metabolic panel  . Lactate dehydrogenase  . TSH      Derek Jack, [place] [phone]

## 2018-05-22 NOTE — Patient Instructions (Signed)
Cancer Center Discharge Instructions for Patients Receiving Chemotherapy  Today you received the following chemotherapy agents  If you develop nausea and vomiting that is not controlled by your nausea medication, call the clinic.   BELOW ARE SYMPTOMS THAT SHOULD BE REPORTED IMMEDIATELY:  *FEVER GREATER THAN 100.5 F  *CHILLS WITH OR WITHOUT FEVER  NAUSEA AND VOMITING THAT IS NOT CONTROLLED WITH YOUR NAUSEA MEDICATION  *UNUSUAL SHORTNESS OF BREATH  *UNUSUAL BRUISING OR BLEEDING  TENDERNESS IN MOUTH AND THROAT WITH OR WITHOUT PRESENCE OF ULCERS  *URINARY PROBLEMS  *BOWEL PROBLEMS  UNUSUAL RASH Items with * indicate a potential emergency and should be followed up as soon as possible.  Feel free to call the clinic should you have any questions or concerns. The clinic phone number is [phone].  Please show the CHEMO ALERT CARD at check-in to the Emergency Department and triage nurse.   

## 2018-05-22 NOTE — Assessment & Plan Note (Signed)
1.  Stage IV adenocarcinoma of the lung, PDL 1-50%, other actionable mutations negative: -Metastatic disease in the right upper lobe, mediastinal and hilar adenopathy, descending mesocolon, left upper buttock subcutaneous tissue, left upper lobe, right subscapularis muscle, left adrenal gland, right breast subcutaneous tissue - Keytruda started on 05/17/2016,  CT scan on 07/09/2017 showing decrease in size of lung nodules, 2 nodules in the serosa of the sigmoid colon, status post colonoscopy which showed submucosal 12 mm firm lesion in the sigmoid colon, no biopsies done. - He is tolerating Keytruda reasonably well.  He has mild diarrhea lasting about 2 to 3 days after each injection. -CT CAP on 01/13/2018 showed similar disease burden in the chest, similar posterior right upper lobe lung mass, and borderline mediastinal adenopathy with no progression.  No abdominal metastatic disease was seen. -He will continue Keytruda until progression.  He missed CT scans. -I have reviewed his blood work today.  They are grossly within normal limits.  We will set up CT CAP in 3 weeks.   2.  Itching: -He complains of chronic itching on his back for the last 2 years. -He previously used Atarax, doxepin and topical steroids.  I have offered for a dermatology referral which she declined.

## 2018-05-22 NOTE — Progress Notes (Signed)
Progress note checked with lab review and ok for treatment today per NP and oncologist.   Patient tolerated chemotherapy with no complaints voiced.  Port site clean and dry with no bruising or swelling noted at site.  Good blood return noted before and after administration of chemotherapy.  Band aid applied.  Patient left ambulatory with VSS and no s/s of distress noted.

## 2018-05-22 NOTE — Patient Instructions (Signed)
[facility] at [facility] Discharge Instructions     Thank you for choosing [facility] at [facility] to provide your oncology and hematology care.  To afford each patient quality time with our provider, please arrive at least 15 minutes before your scheduled appointment time.   If you have a lab appointment with the Cancer Center please come in thru the  Main Entrance and check in at the main information desk  You need to re-schedule your appointment should you arrive 10 or more minutes late.  We strive to give you quality time with our providers, and arriving late affects you and other patients whose appointments are after yours.  Also, if you no show three or more times for appointments you may be dismissed from the clinic at the providers discretion.     Again, thank you for choosing [facility].  Our hope is that these requests will decrease the amount of time that you wait before being seen by our physicians.       _____________________________________________________________  Should you have questions after your visit to  Cancer Center, please contact our office at [phone] between the hours of 8:00 a.m. and 4:30 p.m.  Voicemails left after 4:00 p.m. will not be returned until the following business day.  For prescription refill requests, have your pharmacy contact our office and allow 72 hours.    Cancer Center Support Programs:   > Cancer Support Group  2nd Tuesday of the month 1pm-2pm, Journey Room    

## 2018-05-22 NOTE — Progress Notes (Signed)
Patient in room 412 is ready for his treatment.

## 2018-06-09 ENCOUNTER — Inpatient Hospital Stay (HOSPITAL_COMMUNITY): Payer: Medicare Other | Attending: Hematology

## 2018-06-09 ENCOUNTER — Ambulatory Visit (HOSPITAL_COMMUNITY)
Admission: RE | Admit: 2018-06-09 | Discharge: 2018-06-09 | Disposition: A | Discharge status: Home / Self Care | Payer: Medicare Other | Source: Ambulatory Visit | Attending: Nurse Practitioner | Admitting: Nurse Practitioner

## 2018-06-09 DIAGNOSIS — R590 Localized enlarged lymph nodes: Secondary | ICD-10-CM | POA: Diagnosis not present

## 2018-06-09 DIAGNOSIS — I10 Essential (primary) hypertension: Secondary | ICD-10-CM | POA: Insufficient documentation

## 2018-06-09 DIAGNOSIS — E119 Type 2 diabetes mellitus without complications: Secondary | ICD-10-CM | POA: Diagnosis not present

## 2018-06-09 DIAGNOSIS — Z85118 Personal history of other malignant neoplasm of bronchus and lung: Secondary | ICD-10-CM | POA: Diagnosis not present

## 2018-06-09 DIAGNOSIS — Z7984 Long term (current) use of oral hypoglycemic drugs: Secondary | ICD-10-CM | POA: Diagnosis not present

## 2018-06-09 DIAGNOSIS — C3411 Malignant neoplasm of upper lobe, right bronchus or lung: Secondary | ICD-10-CM | POA: Insufficient documentation

## 2018-06-09 DIAGNOSIS — E279 Disorder of adrenal gland, unspecified: Secondary | ICD-10-CM | POA: Diagnosis not present

## 2018-06-09 DIAGNOSIS — Z86711 Personal history of pulmonary embolism: Secondary | ICD-10-CM | POA: Insufficient documentation

## 2018-06-09 DIAGNOSIS — Z5111 Encounter for antineoplastic chemotherapy: Secondary | ICD-10-CM | POA: Diagnosis not present

## 2018-06-09 DIAGNOSIS — C7801 Secondary malignant neoplasm of right lung: Secondary | ICD-10-CM | POA: Insufficient documentation

## 2018-06-09 DIAGNOSIS — C785 Secondary malignant neoplasm of large intestine and rectum: Secondary | ICD-10-CM | POA: Diagnosis not present

## 2018-06-09 DIAGNOSIS — K760 Fatty (change of) liver, not elsewhere classified: Secondary | ICD-10-CM | POA: Insufficient documentation

## 2018-06-09 DIAGNOSIS — C7802 Secondary malignant neoplasm of left lung: Secondary | ICD-10-CM | POA: Diagnosis not present

## 2018-06-09 DIAGNOSIS — Z79899 Other long term (current) drug therapy: Secondary | ICD-10-CM | POA: Insufficient documentation

## 2018-06-09 LAB — COMPREHENSIVE METABOLIC PANEL
ALT: 19 U/L (ref 0–44)
AST: 19 U/L (ref 15–41)
Albumin: 4.3 g/dL (ref 3.5–5.0)
Alkaline Phosphatase: 83 U/L (ref 38–126)
Anion gap: 10 (ref 5–15)
BUN: 9 mg/dL (ref 8–23)
CO2: 24 mmol/L (ref 22–32)
Calcium: 8.9 mg/dL (ref 8.9–10.3)
Chloride: 102 mmol/L (ref 98–111)
Creatinine, Ser: 1.09 mg/dL (ref 0.61–1.24)
GFR calc Af Amer: >60 mL/min (ref >60)
GFR calc non Af Amer: >60 mL/min (ref >60)
Glucose, Bld: 101 mg/dL — ABNORMAL HIGH (ref 70–99)
Potassium: 4 mmol/L (ref 3.5–5.1)
Sodium: 136 mmol/L (ref 135–145)
Total Bilirubin: 0.4 mg/dL (ref 0.3–1.2)
Total Protein: 7.7 g/dL (ref 6.5–8.1)

## 2018-06-09 LAB — CBC WITH DIFFERENTIAL/PLATELET
Abs Immature Granulocytes: 0.04 10*3/uL (ref 0.00–0.07)
Basophils Absolute: 0.1 10*3/uL (ref 0.0–0.1)
Basophils Relative: 1 %
Eosinophils Absolute: 0.5 10*3/uL (ref 0.0–0.5)
Eosinophils Relative: 5 %
HCT: 47.7 % (ref 39.0–52.0)
Hemoglobin: 15.3 g/dL (ref 13.0–17.0)
Immature Granulocytes: 0 %
Lymphocytes Relative: 32 %
Lymphs Abs: 3.8 10*3/uL (ref 0.7–4.0)
MCH: 28.3 pg (ref 26.0–34.0)
MCHC: 32.1 g/dL (ref 30.0–36.0)
MCV: 88.2 fL (ref 80.0–100.0)
Monocytes Absolute: 0.8 10*3/uL (ref 0.1–1.0)
Monocytes Relative: 6 %
Neutro Abs: 6.8 10*3/uL (ref 1.7–7.7)
Neutrophils Relative %: 56 %
Platelets: 314 10*3/uL (ref 150–400)
RBC: 5.41 MIL/uL (ref 4.22–5.81)
RDW: 13.4 % (ref 11.5–15.5)
WBC: 12 10*3/uL — ABNORMAL HIGH (ref 4.0–10.5)
nRBC: 0 % (ref 0.0–0.2)

## 2018-06-09 LAB — TSH: TSH: 2.886 u[IU]/mL (ref 0.350–4.500)

## 2018-06-09 LAB — LACTATE DEHYDROGENASE: LDH: 170 U/L (ref 98–192)

## 2018-06-09 MED ORDER — IOHEXOL 300 MG/ML  SOLN
100.0000 mL | Freq: Once | INTRAMUSCULAR | Status: AC | PRN
  Administered 2018-06-09: 100 mL via INTRAVENOUS

## 2018-06-11 ENCOUNTER — Other Ambulatory Visit: Payer: Self-pay

## 2018-06-11 ENCOUNTER — Inpatient Hospital Stay (HOSPITAL_COMMUNITY): Payer: Medicare Other | Admitting: Hematology

## 2018-06-11 ENCOUNTER — Other Ambulatory Visit (HOSPITAL_COMMUNITY): Payer: Medicare Other

## 2018-06-11 ENCOUNTER — Encounter (HOSPITAL_COMMUNITY): Payer: Self-pay | Admitting: Hematology

## 2018-06-11 VITALS — BP 160/87 | HR 60 | Resp 16 | Wt 231.0 lb

## 2018-06-11 DIAGNOSIS — C7802 Secondary malignant neoplasm of left lung: Secondary | ICD-10-CM | POA: Diagnosis not present

## 2018-06-11 DIAGNOSIS — Z86711 Personal history of pulmonary embolism: Secondary | ICD-10-CM | POA: Diagnosis not present

## 2018-06-11 DIAGNOSIS — Z79899 Other long term (current) drug therapy: Secondary | ICD-10-CM | POA: Diagnosis not present

## 2018-06-11 DIAGNOSIS — R590 Localized enlarged lymph nodes: Secondary | ICD-10-CM

## 2018-06-11 DIAGNOSIS — C7801 Secondary malignant neoplasm of right lung: Secondary | ICD-10-CM

## 2018-06-11 DIAGNOSIS — C3411 Malignant neoplasm of upper lobe, right bronchus or lung: Secondary | ICD-10-CM

## 2018-06-11 DIAGNOSIS — C785 Secondary malignant neoplasm of large intestine and rectum: Secondary | ICD-10-CM

## 2018-06-11 DIAGNOSIS — K760 Fatty (change of) liver, not elsewhere classified: Secondary | ICD-10-CM | POA: Diagnosis not present

## 2018-06-11 DIAGNOSIS — I10 Essential (primary) hypertension: Secondary | ICD-10-CM | POA: Diagnosis not present

## 2018-06-11 DIAGNOSIS — Z7984 Long term (current) use of oral hypoglycemic drugs: Secondary | ICD-10-CM | POA: Diagnosis not present

## 2018-06-11 DIAGNOSIS — L299 Pruritus, unspecified: Secondary | ICD-10-CM

## 2018-06-11 DIAGNOSIS — E119 Type 2 diabetes mellitus without complications: Secondary | ICD-10-CM | POA: Diagnosis not present

## 2018-06-11 MED ORDER — HYDROXYZINE HCL 25 MG PO TABS: 25.0000 mg | ORAL_TABLET | Freq: Three times a day (TID) | ORAL | 3 refills | Status: DC | PRN

## 2018-06-11 NOTE — Assessment & Plan Note (Signed)
1.  Stage IV adenocarcinoma of the lung, PDL 1-50%, other actionable mutations negative: -Metastatic disease in the right upper lobe, mediastinal and hilar adenopathy, descending mesocolon, left upper buttock subcutaneous tissue, left upper lobe, right subscapularis muscle, left adrenal gland, right breast subcutaneous tissue - Keytruda started on 05/17/2016,  CT scan on 07/09/2017 showing decrease in size of lung nodules, 2 nodules in the serosa of the sigmoid colon, status post colonoscopy which showed submucosal 12 mm firm lesion in the sigmoid colon, no biopsies done. -CT CAP on 01/13/2018 showed similar disease burden in the chest, similar posterior right upper lobe lung mass, and borderline mediastinal adenopathy with no progression.  No abdominal metastatic disease was seen. - He is tolerating Keytruda very well.  He does not report any immunotherapy related side effects. -I have reviewed CT scan images dated 06/09/2018 with the patient and his daughter.  Despite the radiology report says there is progression of the disease in the primary right upper lobe mass, I have personally reviewed and compared with the prior scan.  There is no clear progression.  None of the other lesions have progressed.  Hence we will continue Keytruda at this time. -We will repeat CT scan in 3 months.

## 2018-06-11 NOTE — Patient Instructions (Signed)
[facility] at [facility] Discharge Instructions     Thank you for choosing  Can[facility] Center at [facility] to provide your oncology and hematology care.  To afford each patient quality time with our provider, please arrive at least 15 minutes before your scheduled appointment time.   If you have a lab appointment with the Cancer Center please come in thru the  Main Entrance and check in at the main information desk  You need to re-schedule your appointment should you arrive 10 or more minutes late.  We strive to give you quality time with our providers, and arriving late affects you and other patients whose appointments are after yours.  Also, if you no show three or more times for appointments you may be dismissed from the clinic at the providers discretion.     Again, thank you for choosing [facility].  Our hope is that these requests will decrease the amount of time that you wait before being seen by our physicians.       _____________________________________________________________  Should you have questions after your visit to [facility], please contact our office at [phone] between the hours of 8:00 a.m. and 4:30 p.m.  Voicemails left after 4:00 p.m. will not be returned until the following business day.  For prescription refill requests, have your pharmacy contact our office and allow 72 hours.    Cancer Center Support Programs:   > Cancer Support Group  2nd Tuesday of the month 1pm-2pm, Journey Room    

## 2018-06-11 NOTE — Progress Notes (Signed)
[place], [place] [address]   CLINIC:  Medical Oncology/Hematology  PCP:  Celene Squibb, MD [place] [address] [phone]   REASON FOR VISIT: Follow-up for stage IV adenocarcinoma of the right lung  CURRENT [place]  BRIEF ONCOLOGIC HISTORY:    Primary cancer of right upper lobe of lung ([place])   05/14/2015 Procedure    Port placement by Dr. Arnoldo Morale    04/01/2016 Initial Diagnosis    Primary cancer of right upper lobe of lung ([place])    04/02/2016 Procedure    Ultrasound-guided core biopsy performed of a solid 1.5 cm soft tissue nodule in the right chest wall.    04/03/2016 Imaging    MRI brain- No acute and intracranial process or metastasis.  Moderate chronic small vessel ischemic disease.    04/04/2016 Pathology Results    Diagnosis Soft Tissue Needle Core Biopsy, Right Chest Wall SMOOTH MUSCLE NEOPLASM Microscopic Comment The differential diagnosis include low grade leiomyosarcoma. The neoplasm shows spindle cell morphology with rare mitotic figures and minimal atypia, no necrosis present. These cells stains positive for smooth muscle actin, desmin, negative for cytokeratin AE1&3, ck8/18, s100, cd117, cd 34 and cd99. This case also reviewed by Dr. [name] and agree.    04/05/2016 Procedure    CT-guided biopsy of right upper lobe lesion, with tissue specimen sent to pathology, by IR.    04/09/2016 Pathology Results    Lung, needle/core biopsy(ies), Right Upper Lobe - NON SMALL CELL CARCINOMA.    04/17/2016 Pathology Results    PDL1 High Expression.  TPS 50%.    04/24/2016 PET scan    1. Prominently hypermetabolic large right upper lobe mass with metastatic disease to the right paratracheal and right hilar nodal chains, to the descending mesocolon, to the left upper buttock subcutaneous tissues, to the left upper lobe, to the right subscapularis muscle, and possibly to the left adrenal  gland and right breast subcutaneous tissues. Appearance compatible with stage IV lung cancer. 2. There is some focal high activity in the ascending colon which is probably from peristalsis, less likely from a local colon mass. This does raise the possibility that the adjacent mesocolon tumor implant could be due to synchronous colon cancer rather than lung metastasis. 3. Other imaging findings of potential clinical significance: Coronary, aortic arch, and branch vessel atherosclerotic vascular disease. Aortoiliac atherosclerotic vascular disease. Deformity left proximal humerus and left glenohumeral joint from prior trauma. Enlarged prostate gland.    04/26/2016 Pathology Results    FoundationONE: Genomic alterations identified- KRAS N81R, CREBBP splice site [id number], LRP1B S515*, UXY33 X83*, ANV91 splice site [id number], SPTA1 splice site [id number], TP53 C135Y.  Additional findings- MS-Stable, TMB-intermediate (14 Muts/Mb).  No reportable alterations- EGFR, ALK, BRAF, MET, ERBB2, RET, ROS1.    05/17/2016 -  Chemotherapy    The patient had ONDANSETRON IVPB [facility] +/- DEXAMETHASONE, , Intravenous,  Once, 0 of 1 cycle  pembrolizumab (KEYTRUDA) 200 mg in sodium chloride 0.9 % 50 mL chemo infusion, 200 mg, Intravenous, Once, 0 of 5 cycles  for chemotherapy treatment.      08/06/2016 PET scan    IMPRESSION: 1. Overall considerable improvement. The right upper lobe mass is markedly reduced in volume and SUV. Thoracic adenopathy have resolved and the hypermetabolic lesion in the right subscapularis muscle has also resolved. 2. The left upper lobe nodule is no longer appreciably hypermetabolic, and is highly indistinct although this may be due to motion artifact. Faint  in indistinct nodularity elsewhere in the lungs likewise not currently hypermetabolic. 3. There is some very faint residual low-grade activity along the subcutaneous deposit along the upper left buttock region.  Marked reduction in size and activity of the tumor deposit adjacent to the descending colon. 4. Prior right pleural effusion has resolved. 5. Stable appearance of the adrenal glands, with low-level activity and a nodule in the left adrenal gland. 6. Stable small nodule in the right breast, low-grade metabolic activity with maximum SUV 2.2 (formerly 3.0) 7. Other imaging findings of potential clinical significance: Severe arthropathy of the left glenohumeral joint. Coronary, aortic arch, and branch vessel atherosclerotic vascular disease. Aortoiliac atherosclerotic vascular disease. Prominent prostate gland indents the bladder base.      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  Two adjacent large masslike areas of consolidation within the right upper lobe with differential considerations including malignancy or pneumonia. Multiple enlarged right hilar and mediastinal lymph nodes which may be reactive or metastatic in etiology.  Additional indeterminate pulmonary nodules as above. Recommend attention on follow-up.  Indeterminate 2.9 cm left adrenal nodule. This needs dedicated evaluation with pre and post contrast-enhanced CT or MRI after confirmation of pulmonary process.  Indeterminate nodule within the right breast. Recommend dedicated evaluation with mammography.  Hepatic steatosis.    04/02/2016 Procedure    Ultrasound-guided core biopsy performed of a solid 1.5 cm soft tissue nodule in the right chest wall.    04/04/2016 Pathology Results    Diagnosis Soft Tissue Needle Core Biopsy, Right Chest Wall SMOOTH MUSCLE NEOPLASM Microscopic Comment The differential diagnosis include low grade leiomyosarcoma. The neoplasm shows spindle cell morphology with rare mitotic figures and minimal atypia, no necrosis present. These cells  stains positive for smooth muscle actin, desmin, negative for cytokeratin AE1&3, ck8/18, s100, cd117, cd 34 and cd99. This case also reviewed by Dr. [name] and agree.       CANCER STAGING: Cancer Staging Primary cancer of right upper lobe of lung ([place]) Staging form: Lung, AJCC 8th Edition - Clinical: Stage IVB (cT3, cN2, cM1c) - Signed by Baird Cancer, PA-C on 05/07/2016    INTERVAL HISTORY:  Mr. Perleberg 73 y.o. male returns for routine follow-up for adenocarcinoma of the right lung. He is here today with his wife. He denies worsening of his diarrhea. He denies any new cough. Denies any nausea, vomiting, or diarrhea. Denies any new pains. Had not noticed any recent bleeding such as epistaxis, hematuria or hematochezia. Denies recent chest pain on exertion, shortness of breath on minimal exertion, pre-syncopal episodes, or palpitations. Denies any numbness or tingling in hands or feet. Denies any recent fevers, infections, or recent hospitalizations. Patient reports appetite at 25% and energy level at 25%.    REVIEW OF SYSTEMS:  Review of Systems  Gastrointestinal: Positive for diarrhea.  Musculoskeletal: Positive for myalgias.  Skin: Positive for itching.  All other systems reviewed and are negative.    PAST MEDICAL/SURGICAL HISTORY:  Past Medical History:  Diagnosis Date  . Adrenal mass, left ([place]) 04/01/2016  . Diabetes mellitus ([place]) 05/02/2016  . Diabetes mellitus without complication ([place])   . Hypertension   . Mass of breast, right 04/01/2016  . Mass of upper lobe of right lung 04/01/2016   2 adjacent, 5 cm masses, hilar adenopathy  . Primary cancer of right upper lobe of lung ([place]) 04/01/2016  2 adjacent, 5 cm masses, hilar adenopathy  . Pulmonary embolus ([place]) 03/31/2016  . 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 COLONOSCOPY N/A 07/28/2017   Procedure: COLONOSCOPY;  Surgeon: Rogene Houston, MD;  Location: [facility] END[facility]  SU[facility];  Service: Endoscopy;  Laterality: N/A;  205  . PORTACATH PLACEMENT Left 05/13/2016   Procedure: INSERTION PORT-A-CATH;  Surgeon: Aviva Signs, MD;  Location: [facility] ORS;  Service: General;  Laterality: Left;     SOCIAL HISTORY:  Social History   Socioeconomic History  . Marital status: Divorced    Spouse name: Not on file  . Number of children: Not on file  . Years of education: Not on file  . Highest education level: Not on file  Occupational History  . Not on file  Social Needs  . Financial resource strain: Not on file  . Food insecurity:    Worry: Not on file    Inability: Not on file  . Transportation needs:    Medical: Not on file    Non-medical: Not on file  Tobacco Use  . Smoking status: Current Every Day Smoker    Years: 61.00    Types: E-cigarettes    Last attempt to quit: 04/11/2014    Years since quitting: 4.1  . Smokeless tobacco: Never Used  . Tobacco comment: patient does vape smoking x 2 years  Substance and Sexual Activity  . Alcohol use: No    Comment: quit 10 years  . Drug use: No  . Sexual activity: Not on file  Lifestyle  . Physical activity:    Days per week: Not on file    Minutes per session: Not on file  . Stress: Not on file  Relationships  . Social connections:    Talks on phone: Not on file    Gets together: Not on file    Attends religious service: Not on file    Active member of club or organization: Not on file    Attends meetings of clubs or organizations: Not on file    Relationship status: Not on file  . Intimate partner violence:    Fear of current or ex partner: Not on file    Emotionally abused: Not on file    Physically abused: Not on file    Forced sexual activity: Not on file  Other Topics Concern  . Not on file  Social History Narrative  . Not on file    FAMILY HISTORY:  Family History  Problem Relation Age of Onset  . Stroke Mother   . Heart attack Father   . Heart attack Brother     CURRENT MEDICATIONS:   Outpatient Encounter Medications as of 06/11/2018  Medication Sig  . ALPRAZolam (XANAX) 1 MG tablet Take 1 mg by mouth 3 (three) times daily as needed for anxiety.  Marland Kitchen amLODipine (NORVASC) 2.5 MG tablet Take 2.5 mg by mouth daily.  . diphenoxylate-atropine (LOMOTIL) 2.5-0.025 MG tablet Take 1 tablet 4 (four) times daily as needed by mouth for diarrhea or loose stools.  . furosemide (LASIX) 20 MG tablet Take 2 tablets (40 mg total) by mouth daily.  Marland Kitchen GLIPIZIDE XL 5 MG 24 hr tablet Take 5 mg by mouth daily.  . hydrocortisone cream 0.5 % Apply 1 application topically 2 (two) times daily.  . hydrOXYzine (ATARAX/VISTARIL) 25 MG tablet Take 1 tablet (25 mg total) by mouth 3 (three) times daily as needed.  . lidocaine-prilocaine (EMLA) cream Apply  a quarter size amount to affected area 1 hour prior to coming to chemotherapy. Cover with plastic wrap.  . metFORMIN (GLUCOPHAGE) 500 MG tablet Take 500 mg by mouth 2 (two) times daily.  . metoprolol succinate (TOPROL-XL) 25 MG 24 hr tablet TAKE ONE TABLET BY MOUTH IN THE EVENING.  Marland Kitchen ondansetron (ZOFRAN) 8 MG tablet Take 8 mg by mouth every 8 (eight) hours as needed for nausea or vomiting.  Marland Kitchen oxyCODONE-acetaminophen (PERCOCET) 10-325 MG tablet   . Pembrolizumab (KEYTRUDA IV) Inject into the vein.  . potassium chloride (K-DUR,KLOR-CON) 10 MEQ tablet   . prochlorperazine (COMPAZINE) 10 MG tablet   . simvastatin (ZOCOR) 20 MG tablet Take 20 mg by mouth every evening.  . Skin Protectants, Misc. (EUCERIN) cream Apply topically as needed for dry skin.  Marland Kitchen spironolactone (ALDACTONE) 50 MG tablet Take 1 tablet (50 mg total) by mouth daily.  Marland Kitchen triamcinolone cream (KENALOG) 0.1 % Apply 1 application topically 2 (two) times daily.  . [DISCONTINUED] hydrOXYzine (ATARAX/VISTARIL) 25 MG tablet Take 1 tablet (25 mg total) by mouth 3 (three) times daily as needed.   No facility-administered encounter medications on file as of 06/11/2018.     ALLERGIES:  No Known  Allergies   PHYSICAL EXAM:  ECOG Performance status: 1  Vitals:   06/11/18 1000  BP: (!) 160/87  Pulse: 60  Resp: 16  SpO2: 98%   Filed Weights   06/11/18 1000  Weight: 231 lb (104.8 kg)    Physical Exam Constitutional:      Appearance: Normal appearance. He is normal weight.  Cardiovascular:     Rate and Rhythm: Normal rate and regular rhythm.     Heart sounds: Normal heart sounds.  Pulmonary:     Effort: Pulmonary effort is normal.     Breath sounds: Normal breath sounds.  Musculoskeletal: Normal range of motion.  Skin:    General: Skin is warm and dry.  Neurological:     Mental Status: He is alert and oriented to person, place, and time. Mental status is at baseline.  Psychiatric:        Mood and Affect: Mood normal.        Behavior: Behavior normal.        Thought Content: Thought content normal.        Judgment: Judgment normal.      LABORATORY DATA:  I have reviewed the labs as listed.  CBC    Component Value Date/Time   WBC 12.0 (H) 06/09/2018 1544   RBC 5.41 06/09/2018 1544   HGB 15.3 06/09/2018 1544   HGB 13.5 05/02/2016 0852   HCT 47.7 06/09/2018 1544   HCT 41.0 05/02/2016 0852   PLT 314 06/09/2018 1544   PLT 436 (H) 05/02/2016 0852   MCV 88.2 06/09/2018 1544   MCV 85.7 05/02/2016 0852   MCH 28.3 06/09/2018 1544   MCHC 32.1 06/09/2018 1544   RDW 13.4 06/09/2018 1544   RDW 13.9 05/02/2016 0852   LYMPHSABS 3.8 06/09/2018 1544   LYMPHSABS 3.4 (H) 05/02/2016 0852   MONOABS 0.8 06/09/2018 1544   MONOABS 1.2 (H) 05/02/2016 0852   EOSABS 0.5 06/09/2018 1544   EOSABS 1.6 (H) 05/02/2016 0852   BASOSABS 0.1 06/09/2018 1544   BASOSABS 0.2 (H) 05/02/2016 0852   CMP Latest Ref Rng & Units 06/09/2018 05/22/2018 05/01/2018  Glucose 70 - 99 mg/dL 101(H) 213(H) 139(H)  BUN 8 - 23 mg/dL '9 12 13  ' Creatinine 0.61 - 1.24 mg/dL 1.09 1.14 1.26(H)  Sodium  135 - 145 mmol/L 136 136 140  Potassium 3.5 - 5.1 mmol/L 4.0 3.5 3.8  Chloride 98 - 111 mmol/L 102 101  104  CO2 22 - 32 mmol/L '24 25 26  ' Calcium 8.9 - 10.3 mg/dL 8.9 8.9 8.8(L)  Total Protein 6.5 - 8.1 g/dL 7.7 7.1 7.6  Total Bilirubin 0.3 - 1.2 mg/dL 0.4 0.5 0.8  Alkaline Phos 38 - 126 U/L 83 76 73  AST 15 - 41 U/L '19 21 21  ' ALT 0 - 44 U/L '19 19 22       ' DIAGNOSTIC IMAGING:  I have independently reviewed the scans and discussed with the patient.   I have reviewed Francene Finders, NP's note and agree with the documentation.  I personally performed a face-to-face visit, made revisions and my assessment and plan is as follows.    ASSESSMENT & PLAN:   Primary cancer of right upper lobe of lung ([place]) 1.  Stage IV adenocarcinoma of the lung, PDL 1-50%, other actionable mutations negative: -Metastatic disease in the right upper lobe, mediastinal and hilar adenopathy, descending mesocolon, left upper buttock subcutaneous tissue, left upper lobe, right subscapularis muscle, left adrenal gland, right breast subcutaneous tissue - Keytruda started on 05/17/2016,  CT scan on 07/09/2017 showing decrease in size of lung nodules, 2 nodules in the serosa of the sigmoid colon, status post colonoscopy which showed submucosal 12 mm firm lesion in the sigmoid colon, no biopsies done. -CT CAP on 01/13/2018 showed similar disease burden in the chest, similar posterior right upper lobe lung mass, and borderline mediastinal adenopathy with no progression.  No abdominal metastatic disease was seen. - He is tolerating Keytruda very well.  He does not report any immunotherapy related side effects. -I have reviewed CT scan images dated 06/09/2018 with the patient and his daughter.  Despite the radiology report says there is progression of the disease in the primary right upper lobe mass, I have personally reviewed and compared with the prior scan.  There is no clear progression.  None of the other lesions have progressed.  Hence we will continue Keytruda at this time. -We will repeat CT scan in 3 months.       Orders  placed this encounter:  Orders Placed This Encounter  Procedures  . CT CHEST W CONTRAST  . CT Abdomen Pelvis W Contrast  . CBC with Differential/Platelet  . Comprehensive metabolic panel  . TSH  . Magnesium  . CBC with Differential/Platelet  . Comprehensive metabolic panel      Derek Jack, MD [place] [phone]

## 2018-06-12 ENCOUNTER — Encounter (HOSPITAL_COMMUNITY): Payer: Self-pay

## 2018-06-12 ENCOUNTER — Inpatient Hospital Stay (HOSPITAL_COMMUNITY): Payer: Medicare Other

## 2018-06-12 ENCOUNTER — Ambulatory Visit (HOSPITAL_COMMUNITY): Payer: Medicare Other

## 2018-06-12 VITALS — BP 155/89 | HR 80 | Temp 97.2°F | Resp 18 | Wt 226.6 lb

## 2018-06-12 DIAGNOSIS — Z86711 Personal history of pulmonary embolism: Secondary | ICD-10-CM | POA: Diagnosis not present

## 2018-06-12 DIAGNOSIS — C7802 Secondary malignant neoplasm of left lung: Secondary | ICD-10-CM | POA: Diagnosis not present

## 2018-06-12 DIAGNOSIS — E119 Type 2 diabetes mellitus without complications: Secondary | ICD-10-CM | POA: Diagnosis not present

## 2018-06-12 DIAGNOSIS — R590 Localized enlarged lymph nodes: Secondary | ICD-10-CM | POA: Diagnosis not present

## 2018-06-12 DIAGNOSIS — Z79899 Other long term (current) drug therapy: Secondary | ICD-10-CM | POA: Diagnosis not present

## 2018-06-12 DIAGNOSIS — C7801 Secondary malignant neoplasm of right lung: Secondary | ICD-10-CM | POA: Diagnosis not present

## 2018-06-12 DIAGNOSIS — Z7984 Long term (current) use of oral hypoglycemic drugs: Secondary | ICD-10-CM | POA: Diagnosis not present

## 2018-06-12 DIAGNOSIS — C785 Secondary malignant neoplasm of large intestine and rectum: Secondary | ICD-10-CM | POA: Diagnosis not present

## 2018-06-12 DIAGNOSIS — I10 Essential (primary) hypertension: Secondary | ICD-10-CM | POA: Diagnosis not present

## 2018-06-12 DIAGNOSIS — C3411 Malignant neoplasm of upper lobe, right bronchus or lung: Secondary | ICD-10-CM | POA: Diagnosis not present

## 2018-06-12 DIAGNOSIS — K760 Fatty (change of) liver, not elsewhere classified: Secondary | ICD-10-CM | POA: Diagnosis not present

## 2018-06-12 MED ORDER — SODIUM CHLORIDE 0.9% FLUSH
10.0000 mL | INTRAVENOUS | Status: DC | PRN
  Administered 2018-06-12: 10 mL
  Filled 2018-06-12: qty 10

## 2018-06-12 MED ORDER — HEPARIN SOD (PORK) LOCK FLUSH 100 UNIT/ML IV SOLN
500.0000 [IU] | Freq: Once | INTRAVENOUS | Status: AC | PRN
  Administered 2018-06-12: 500 [IU]

## 2018-06-12 MED ORDER — SODIUM CHLORIDE 0.9 % IV SOLN
Freq: Once | INTRAVENOUS | Status: AC
  Administered 2018-06-12: 12:00:00 via INTRAVENOUS

## 2018-06-12 MED ORDER — SODIUM CHLORIDE 0.9 % IV SOLN
200.0000 mg | Freq: Once | INTRAVENOUS | Status: AC
  Administered 2018-06-12: 200 mg via INTRAVENOUS
  Filled 2018-06-12: qty 8

## 2018-06-12 NOTE — Progress Notes (Signed)
Patient tolerated chemotherapy with no complaints voiced.  Port site clean and dry with no bruising or swelling noted at site.  Good blood return noted before and after administration of chemotherapy.  Band aid applied.  Patient left ambulatory with VSS and no s/s of distress noted.

## 2018-06-12 NOTE — Patient Instructions (Signed)
[facility] Discharge Instructions for Patients Receiving Chemotherapy  Today you received the following chemotherapy agents  If you develop nausea and vomiting that is not controlled by your nausea medication, call the clinic.   BELOW ARE SYMPTOMS THAT SHOULD BE REPORTED IMMEDIATELY:  *FEVER GREATER THAN 100.5 F  *CHILLS WITH OR WITHOUT FEVER  NAUSEA AND VOMITING THAT IS NOT CONTROLLED WITH YOUR NAUSEA MEDICATION  *UNUSUAL SHORTNESS OF BREATH  *UNUSUAL BRUISING OR BLEEDING  TENDERNESS IN MOUTH AND THROAT WITH OR WITHOUT PRESENCE OF ULCERS  *URINARY PROBLEMS  *BOWEL PROBLEMS  UNUSUAL RASH Items with * indicate a potential emergency and should be followed up as soon as possible.  Feel free to call the clinic should you have any questions or concerns. The clinic phone number is [phone].  Please show the CHEMO ALERT CARD at check-in to the Emergency Department and triage nurse.   

## 2018-07-02 ENCOUNTER — Inpatient Hospital Stay (HOSPITAL_COMMUNITY): Payer: Medicare Other | Attending: Internal Medicine

## 2018-07-02 ENCOUNTER — Inpatient Hospital Stay (HOSPITAL_COMMUNITY): Payer: Medicare Other

## 2018-07-02 ENCOUNTER — Encounter (HOSPITAL_COMMUNITY): Payer: Self-pay

## 2018-07-02 VITALS — BP 160/88 | HR 65 | Temp 97.6°F | Resp 18 | Wt 224.6 lb

## 2018-07-02 DIAGNOSIS — F17210 Nicotine dependence, cigarettes, uncomplicated: Secondary | ICD-10-CM | POA: Insufficient documentation

## 2018-07-02 DIAGNOSIS — C3411 Malignant neoplasm of upper lobe, right bronchus or lung: Secondary | ICD-10-CM

## 2018-07-02 DIAGNOSIS — L299 Pruritus, unspecified: Secondary | ICD-10-CM | POA: Insufficient documentation

## 2018-07-02 DIAGNOSIS — C785 Secondary malignant neoplasm of large intestine and rectum: Secondary | ICD-10-CM | POA: Diagnosis not present

## 2018-07-02 DIAGNOSIS — Z79899 Other long term (current) drug therapy: Secondary | ICD-10-CM | POA: Insufficient documentation

## 2018-07-02 DIAGNOSIS — Z5112 Encounter for antineoplastic immunotherapy: Secondary | ICD-10-CM | POA: Insufficient documentation

## 2018-07-02 DIAGNOSIS — C7802 Secondary malignant neoplasm of left lung: Secondary | ICD-10-CM | POA: Diagnosis not present

## 2018-07-02 LAB — CBC WITH DIFFERENTIAL/PLATELET
Abs Immature Granulocytes: 0.05 10*3/uL (ref 0.00–0.07)
Basophils Absolute: 0.1 10*3/uL (ref 0.0–0.1)
Basophils Relative: 1 %
Eosinophils Absolute: 0.7 10*3/uL — ABNORMAL HIGH (ref 0.0–0.5)
Eosinophils Relative: 5 %
HCT: 47.8 % (ref 39.0–52.0)
Hemoglobin: 15 g/dL (ref 13.0–17.0)
Immature Granulocytes: 0 %
Lymphocytes Relative: 36 %
Lymphs Abs: 4.4 10*3/uL — ABNORMAL HIGH (ref 0.7–4.0)
MCH: 27.9 pg (ref 26.0–34.0)
MCHC: 31.4 g/dL (ref 30.0–36.0)
MCV: 89 fL (ref 80.0–100.0)
Monocytes Absolute: 1 10*3/uL (ref 0.1–1.0)
Monocytes Relative: 8 %
Neutro Abs: 5.8 10*3/uL (ref 1.7–7.7)
Neutrophils Relative %: 50 %
Platelets: 299 10*3/uL (ref 150–400)
RBC: 5.37 MIL/uL (ref 4.22–5.81)
RDW: 13.6 % (ref 11.5–15.5)
WBC: 12.1 10*3/uL — ABNORMAL HIGH (ref 4.0–10.5)
nRBC: 0 % (ref 0.0–0.2)

## 2018-07-02 LAB — COMPREHENSIVE METABOLIC PANEL
ALT: 15 U/L (ref 0–44)
AST: 15 U/L (ref 15–41)
Albumin: 4.1 g/dL (ref 3.5–5.0)
Alkaline Phosphatase: 91 U/L (ref 38–126)
Anion gap: 10 (ref 5–15)
BUN: 16 mg/dL (ref 8–23)
CO2: 27 mmol/L (ref 22–32)
Calcium: 9.1 mg/dL (ref 8.9–10.3)
Chloride: 103 mmol/L (ref 98–111)
Creatinine, Ser: 1.16 mg/dL (ref 0.61–1.24)
GFR calc Af Amer: >60 mL/min (ref >60)
GFR calc non Af Amer: >60 mL/min (ref >60)
Glucose, Bld: 196 mg/dL — ABNORMAL HIGH (ref 70–99)
Potassium: 4 mmol/L (ref 3.5–5.1)
Sodium: 140 mmol/L (ref 135–145)
Total Bilirubin: 0.3 mg/dL (ref 0.3–1.2)
Total Protein: 7 g/dL (ref 6.5–8.1)

## 2018-07-02 LAB — MAGNESIUM: Magnesium: 2.2 mg/dL (ref 1.7–2.4)

## 2018-07-02 MED ORDER — HEPARIN SOD (PORK) LOCK FLUSH 100 UNIT/ML IV SOLN
500.0000 [IU] | Freq: Once | INTRAVENOUS | Status: AC | PRN
  Administered 2018-07-02: 500 [IU]

## 2018-07-02 MED ORDER — SODIUM CHLORIDE 0.9% FLUSH
10.0000 mL | INTRAVENOUS | Status: DC | PRN
  Administered 2018-07-02: 10 mL
  Filled 2018-07-02: qty 10

## 2018-07-02 MED ORDER — SODIUM CHLORIDE 0.9 % IV SOLN
Freq: Once | INTRAVENOUS | Status: AC
  Administered 2018-07-02: 09:00:00 via INTRAVENOUS

## 2018-07-02 MED ORDER — SODIUM CHLORIDE 0.9 % IV SOLN
200.0000 mg | Freq: Once | INTRAVENOUS | Status: AC
  Administered 2018-07-02: 200 mg via INTRAVENOUS
  Filled 2018-07-02: qty 8

## 2018-07-02 NOTE — Patient Instructions (Signed)
[facility] Discharge Instructions for Patients Receiving Chemotherapy  Today you received the following chemotherapy agents  If you develop nausea and vomiting that is not controlled by your nausea medication, call the clinic.   BELOW ARE SYMPTOMS THAT SHOULD BE REPORTED IMMEDIATELY:  *FEVER GREATER THAN 100.5 F  *CHILLS WITH OR WITHOUT FEVER  NAUSEA AND VOMITING THAT IS NOT CONTROLLED WITH YOUR NAUSEA MEDICATION  *UNUSUAL SHORTNESS OF BREATH  *UNUSUAL BRUISING OR BLEEDING  TENDERNESS IN MOUTH AND THROAT WITH OR WITHOUT PRESENCE OF ULCERS  *URINARY PROBLEMS  *BOWEL PROBLEMS  UNUSUAL RASH Items with * indicate a potential emergency and should be followed up as soon as possible.  Feel free to call the clinic should you have any questions or concerns. The clinic phone number is [phone].  Please show the CHEMO ALERT CARD at check-in to the Emergency Department and triage nurse.   

## 2018-07-02 NOTE — Progress Notes (Signed)
Patient tolerated chemotherapy with no complaints voiced.  Port site clean and dry with no bruising or swelling noted at site.  Good blood return noted before and after administration of chemotherapy.  Band aid applied.  Patient left ambulatory with VSS and no s/s of distress noted.

## 2018-07-23 ENCOUNTER — Other Ambulatory Visit (HOSPITAL_COMMUNITY): Payer: Medicare Other

## 2018-07-23 ENCOUNTER — Other Ambulatory Visit: Payer: Self-pay

## 2018-07-23 ENCOUNTER — Encounter (HOSPITAL_COMMUNITY): Payer: Self-pay

## 2018-07-23 ENCOUNTER — Inpatient Hospital Stay (HOSPITAL_COMMUNITY): Payer: Medicare Other

## 2018-07-23 ENCOUNTER — Inpatient Hospital Stay (HOSPITAL_COMMUNITY): Payer: Medicare Other | Attending: Hematology

## 2018-07-23 VITALS — BP 141/68 | HR 85 | Temp 97.8°F | Resp 18 | Wt 229.6 lb

## 2018-07-23 DIAGNOSIS — C785 Secondary malignant neoplasm of large intestine and rectum: Secondary | ICD-10-CM | POA: Diagnosis not present

## 2018-07-23 DIAGNOSIS — F17210 Nicotine dependence, cigarettes, uncomplicated: Secondary | ICD-10-CM | POA: Diagnosis not present

## 2018-07-23 DIAGNOSIS — C3411 Malignant neoplasm of upper lobe, right bronchus or lung: Secondary | ICD-10-CM | POA: Diagnosis not present

## 2018-07-23 DIAGNOSIS — L299 Pruritus, unspecified: Secondary | ICD-10-CM | POA: Insufficient documentation

## 2018-07-23 DIAGNOSIS — Z5112 Encounter for antineoplastic immunotherapy: Secondary | ICD-10-CM | POA: Insufficient documentation

## 2018-07-23 DIAGNOSIS — Z79899 Other long term (current) drug therapy: Secondary | ICD-10-CM | POA: Insufficient documentation

## 2018-07-23 DIAGNOSIS — C7802 Secondary malignant neoplasm of left lung: Secondary | ICD-10-CM | POA: Diagnosis not present

## 2018-07-23 LAB — CBC WITH DIFFERENTIAL/PLATELET
Abs Immature Granulocytes: 0.04 10*3/uL (ref 0.00–0.07)
Basophils Absolute: 0.1 10*3/uL (ref 0.0–0.1)
Basophils Relative: 1 %
Eosinophils Absolute: 0.7 10*3/uL — ABNORMAL HIGH (ref 0.0–0.5)
Eosinophils Relative: 6 %
HCT: 43.6 % (ref 39.0–52.0)
Hemoglobin: 14.5 g/dL (ref 13.0–17.0)
Immature Granulocytes: 0 %
Lymphocytes Relative: 36 %
Lymphs Abs: 4.2 10*3/uL — ABNORMAL HIGH (ref 0.7–4.0)
MCH: 29.2 pg (ref 26.0–34.0)
MCHC: 33.3 g/dL (ref 30.0–36.0)
MCV: 87.9 fL (ref 80.0–100.0)
Monocytes Absolute: 1 10*3/uL (ref 0.1–1.0)
Monocytes Relative: 8 %
Neutro Abs: 5.7 10*3/uL (ref 1.7–7.7)
Neutrophils Relative %: 49 %
Platelets: 286 10*3/uL (ref 150–400)
RBC: 4.96 MIL/uL (ref 4.22–5.81)
RDW: 13.3 % (ref 11.5–15.5)
WBC: 11.7 10*3/uL — ABNORMAL HIGH (ref 4.0–10.5)
nRBC: 0 % (ref 0.0–0.2)

## 2018-07-23 LAB — COMPREHENSIVE METABOLIC PANEL
ALT: 16 U/L (ref 0–44)
AST: 16 U/L (ref 15–41)
Albumin: 3.9 g/dL (ref 3.5–5.0)
Alkaline Phosphatase: 81 U/L (ref 38–126)
Anion gap: 10 (ref 5–15)
BUN: 12 mg/dL (ref 8–23)
CO2: 25 mmol/L (ref 22–32)
Calcium: 8.8 mg/dL — ABNORMAL LOW (ref 8.9–10.3)
Chloride: 106 mmol/L (ref 98–111)
Creatinine, Ser: 1.2 mg/dL (ref 0.61–1.24)
GFR calc Af Amer: >60 mL/min (ref >60)
GFR calc non Af Amer: 60 mL/min — ABNORMAL LOW (ref >60)
Glucose, Bld: 208 mg/dL — ABNORMAL HIGH (ref 70–99)
Potassium: 4.2 mmol/L (ref 3.5–5.1)
Sodium: 141 mmol/L (ref 135–145)
Total Bilirubin: 0.3 mg/dL (ref 0.3–1.2)
Total Protein: 6.8 g/dL (ref 6.5–8.1)

## 2018-07-23 LAB — MAGNESIUM: Magnesium: 2.1 mg/dL (ref 1.7–2.4)

## 2018-07-23 LAB — TSH: TSH: 2.401 u[IU]/mL (ref 0.350–4.500)

## 2018-07-23 MED ORDER — SODIUM CHLORIDE 0.9% FLUSH
10.0000 mL | INTRAVENOUS | Status: DC | PRN
  Administered 2018-07-23 (×2): 10 mL
  Filled 2018-07-23 (×2): qty 10

## 2018-07-23 MED ORDER — HEPARIN SOD (PORK) LOCK FLUSH 100 UNIT/ML IV SOLN
500.0000 [IU] | Freq: Once | INTRAVENOUS | Status: AC | PRN
  Administered 2018-07-23: 500 [IU]

## 2018-07-23 MED ORDER — SODIUM CHLORIDE 0.9 % IV SOLN
200.0000 mg | Freq: Once | INTRAVENOUS | Status: AC
  Administered 2018-07-23: 200 mg via INTRAVENOUS
  Filled 2018-07-23: qty 8

## 2018-07-23 MED ORDER — SODIUM CHLORIDE 0.9 % IV SOLN
Freq: Once | INTRAVENOUS | Status: AC
  Administered 2018-07-23: 09:00:00 via INTRAVENOUS

## 2018-07-23 NOTE — Patient Instructions (Signed)
[facility] Discharge Instructions for Patients Receiving Chemotherapy  Today you received the following chemotherapy agents   To help prevent nausea and vomiting after your treatment, we encourage you to take your nausea medication   If you develop nausea and vomiting that is not controlled by your nausea medication, call the clinic.   BELOW ARE SYMPTOMS THAT SHOULD BE REPORTED IMMEDIATELY:  *FEVER GREATER THAN 100.5 F  *CHILLS WITH OR WITHOUT FEVER  NAUSEA AND VOMITING THAT IS NOT CONTROLLED WITH YOUR NAUSEA MEDICATION  *UNUSUAL SHORTNESS OF BREATH  *UNUSUAL BRUISING OR BLEEDING  TENDERNESS IN MOUTH AND THROAT WITH OR WITHOUT PRESENCE OF ULCERS  *URINARY PROBLEMS  *BOWEL PROBLEMS  UNUSUAL RASH Items with * indicate a potential emergency and should be followed up as soon as possible.  Feel free to call the clinic should you have any questions or concerns. The clinic phone number is [phone].  Please show the CHEMO ALERT CARD at check-in to the Emergency Department and triage nurse.   

## 2018-07-23 NOTE — Progress Notes (Signed)
Pt presents today for [place]. VSS. No complaints of any changes since the last visit. MAR reviewed. Labs within parameters.    Treatment given today per MD orders. Tolerated infusion without adverse affects. Vital signs stable. No complaints at this time. Discharged from clinic ambulatory. F/U with [facility] as scheduled.

## 2018-08-05 ENCOUNTER — Other Ambulatory Visit (HOSPITAL_COMMUNITY): Payer: Self-pay | Admitting: *Deleted

## 2018-08-13 ENCOUNTER — Inpatient Hospital Stay (HOSPITAL_COMMUNITY): Payer: Medicare Other

## 2018-08-13 ENCOUNTER — Encounter (HOSPITAL_COMMUNITY): Payer: Self-pay

## 2018-08-13 ENCOUNTER — Other Ambulatory Visit (HOSPITAL_COMMUNITY): Payer: Medicare Other

## 2018-08-13 ENCOUNTER — Inpatient Hospital Stay (HOSPITAL_COMMUNITY): Payer: Medicare Other | Attending: Hematology

## 2018-08-13 ENCOUNTER — Other Ambulatory Visit: Payer: Self-pay

## 2018-08-13 VITALS — BP 144/93 | HR 82 | Temp 98.4°F | Resp 18 | Wt 233.0 lb

## 2018-08-13 DIAGNOSIS — C3411 Malignant neoplasm of upper lobe, right bronchus or lung: Secondary | ICD-10-CM | POA: Insufficient documentation

## 2018-08-13 DIAGNOSIS — Z5112 Encounter for antineoplastic immunotherapy: Secondary | ICD-10-CM | POA: Insufficient documentation

## 2018-08-13 DIAGNOSIS — F17210 Nicotine dependence, cigarettes, uncomplicated: Secondary | ICD-10-CM | POA: Insufficient documentation

## 2018-08-13 DIAGNOSIS — C7802 Secondary malignant neoplasm of left lung: Secondary | ICD-10-CM | POA: Insufficient documentation

## 2018-08-13 DIAGNOSIS — C785 Secondary malignant neoplasm of large intestine and rectum: Secondary | ICD-10-CM | POA: Diagnosis not present

## 2018-08-13 DIAGNOSIS — L299 Pruritus, unspecified: Secondary | ICD-10-CM | POA: Insufficient documentation

## 2018-08-13 DIAGNOSIS — Z79899 Other long term (current) drug therapy: Secondary | ICD-10-CM | POA: Insufficient documentation

## 2018-08-13 LAB — CBC WITH DIFFERENTIAL/PLATELET
Abs Immature Granulocytes: 0.05 10*3/uL (ref 0.00–0.07)
Basophils Absolute: 0.1 10*3/uL (ref 0.0–0.1)
Basophils Relative: 1 %
Eosinophils Absolute: 0.7 10*3/uL — ABNORMAL HIGH (ref 0.0–0.5)
Eosinophils Relative: 5 %
HCT: 46.4 % (ref 39.0–52.0)
Hemoglobin: 14.8 g/dL (ref 13.0–17.0)
Immature Granulocytes: 0 %
Lymphocytes Relative: 39 %
Lymphs Abs: 5.1 10*3/uL — ABNORMAL HIGH (ref 0.7–4.0)
MCH: 28.5 pg (ref 26.0–34.0)
MCHC: 31.9 g/dL (ref 30.0–36.0)
MCV: 89.2 fL (ref 80.0–100.0)
Monocytes Absolute: 0.9 10*3/uL (ref 0.1–1.0)
Monocytes Relative: 7 %
Neutro Abs: 6.5 10*3/uL (ref 1.7–7.7)
Neutrophils Relative %: 48 %
Platelets: 321 10*3/uL (ref 150–400)
RBC: 5.2 MIL/uL (ref 4.22–5.81)
RDW: 13.2 % (ref 11.5–15.5)
WBC: 13.3 10*3/uL — ABNORMAL HIGH (ref 4.0–10.5)
nRBC: 0 % (ref 0.0–0.2)

## 2018-08-13 LAB — COMPREHENSIVE METABOLIC PANEL
ALT: 16 U/L (ref 0–44)
AST: 18 U/L (ref 15–41)
Albumin: 4 g/dL (ref 3.5–5.0)
Alkaline Phosphatase: 102 U/L (ref 38–126)
Anion gap: 11 (ref 5–15)
BUN: 17 mg/dL (ref 8–23)
CO2: 26 mmol/L (ref 22–32)
Calcium: 9.1 mg/dL (ref 8.9–10.3)
Chloride: 105 mmol/L (ref 98–111)
Creatinine, Ser: 1.16 mg/dL (ref 0.61–1.24)
GFR calc Af Amer: >60 mL/min (ref >60)
GFR calc non Af Amer: >60 mL/min (ref >60)
Glucose, Bld: 176 mg/dL — ABNORMAL HIGH (ref 70–99)
Potassium: 3.7 mmol/L (ref 3.5–5.1)
Sodium: 142 mmol/L (ref 135–145)
Total Bilirubin: 0.2 mg/dL — ABNORMAL LOW (ref 0.3–1.2)
Total Protein: 7.2 g/dL (ref 6.5–8.1)

## 2018-08-13 LAB — MAGNESIUM: Magnesium: 2 mg/dL (ref 1.7–2.4)

## 2018-08-13 MED ORDER — SODIUM CHLORIDE 0.9 % IV SOLN
200.0000 mg | Freq: Once | INTRAVENOUS | Status: AC
  Administered 2018-08-13: 10:00:00 200 mg via INTRAVENOUS
  Filled 2018-08-13: qty 8

## 2018-08-13 MED ORDER — SODIUM CHLORIDE 0.9 % IV SOLN
Freq: Once | INTRAVENOUS | Status: AC
  Administered 2018-08-13: 10:00:00 via INTRAVENOUS

## 2018-08-13 NOTE — Progress Notes (Signed)
Treatment given per orders. Patient tolerated it well without problems. Vitals stable and discharged home from clinic ambulatory. Follow up as scheduled.  

## 2018-08-13 NOTE — Patient Instructions (Signed)
[facility] Discharge Instructions for Patients Receiving Chemotherapy  Today you received the following chemotherapy agents   To help prevent nausea and vomiting after your treatment, we encourage you to take your nausea medication   If you develop nausea and vomiting that is not controlled by your nausea medication, call the clinic.   BELOW ARE SYMPTOMS THAT SHOULD BE REPORTED IMMEDIATELY:  *FEVER GREATER THAN 100.5 F  *CHILLS WITH OR WITHOUT FEVER  NAUSEA AND VOMITING THAT IS NOT CONTROLLED WITH YOUR NAUSEA MEDICATION  *UNUSUAL SHORTNESS OF BREATH  *UNUSUAL BRUISING OR BLEEDING  TENDERNESS IN MOUTH AND THROAT WITH OR WITHOUT PRESENCE OF ULCERS  *URINARY PROBLEMS  *BOWEL PROBLEMS  UNUSUAL RASH Items with * indicate a potential emergency and should be followed up as soon as possible.  Feel free to call the clinic should you have any questions or concerns. The clinic phone number is [phone].  Please show the CHEMO ALERT CARD at check-in to the Emergency Department and triage nurse.   

## 2018-08-18 ENCOUNTER — Telehealth: Payer: Self-pay | Admitting: General Practice

## 2018-08-18 NOTE — Telephone Encounter (Signed)
[facility] CSW Progress Notes  Call from daughter [name], wondering if patient is scheduled for another food delivery.  Pt is scheduled for 4/20 delivery, patient and daughter informed of this.  Per patient, he is "doing fine" at this time.  Edwyna Shell, LCSW Clinical Social Worker Phone:  [phone]

## 2018-08-28 DIAGNOSIS — Z0000 Encounter for general adult medical examination without abnormal findings: Secondary | ICD-10-CM | POA: Diagnosis not present

## 2018-09-03 ENCOUNTER — Other Ambulatory Visit (HOSPITAL_COMMUNITY): Payer: Medicare Other

## 2018-09-03 ENCOUNTER — Inpatient Hospital Stay (HOSPITAL_COMMUNITY): Payer: Medicare Other

## 2018-09-03 ENCOUNTER — Other Ambulatory Visit: Payer: Self-pay

## 2018-09-03 ENCOUNTER — Encounter (HOSPITAL_COMMUNITY): Payer: Self-pay

## 2018-09-03 VITALS — BP 162/82 | HR 83 | Temp 98.1°F | Resp 18 | Wt 231.4 lb

## 2018-09-03 DIAGNOSIS — C499 Malignant neoplasm of connective and soft tissue, unspecified: Secondary | ICD-10-CM

## 2018-09-03 DIAGNOSIS — C3411 Malignant neoplasm of upper lobe, right bronchus or lung: Secondary | ICD-10-CM

## 2018-09-03 DIAGNOSIS — L299 Pruritus, unspecified: Secondary | ICD-10-CM | POA: Diagnosis not present

## 2018-09-03 DIAGNOSIS — C785 Secondary malignant neoplasm of large intestine and rectum: Secondary | ICD-10-CM | POA: Diagnosis not present

## 2018-09-03 DIAGNOSIS — Z5112 Encounter for antineoplastic immunotherapy: Secondary | ICD-10-CM | POA: Diagnosis not present

## 2018-09-03 DIAGNOSIS — Z79899 Other long term (current) drug therapy: Secondary | ICD-10-CM | POA: Diagnosis not present

## 2018-09-03 DIAGNOSIS — C7802 Secondary malignant neoplasm of left lung: Secondary | ICD-10-CM | POA: Diagnosis not present

## 2018-09-03 DIAGNOSIS — F17210 Nicotine dependence, cigarettes, uncomplicated: Secondary | ICD-10-CM | POA: Diagnosis not present

## 2018-09-03 LAB — CBC WITH DIFFERENTIAL/PLATELET
Abs Immature Granulocytes: 0.03 10*3/uL (ref 0.00–0.07)
Basophils Absolute: 0.1 10*3/uL (ref 0.0–0.1)
Basophils Relative: 1 %
Eosinophils Absolute: 0.6 10*3/uL — ABNORMAL HIGH (ref 0.0–0.5)
Eosinophils Relative: 5 %
HCT: 45 % (ref 39.0–52.0)
Hemoglobin: 14.8 g/dL (ref 13.0–17.0)
Immature Granulocytes: 0 %
Lymphocytes Relative: 37 %
Lymphs Abs: 4.1 10*3/uL — ABNORMAL HIGH (ref 0.7–4.0)
MCH: 28.7 pg (ref 26.0–34.0)
MCHC: 32.9 g/dL (ref 30.0–36.0)
MCV: 87.4 fL (ref 80.0–100.0)
Monocytes Absolute: 0.8 10*3/uL (ref 0.1–1.0)
Monocytes Relative: 7 %
Neutro Abs: 5.6 10*3/uL (ref 1.7–7.7)
Neutrophils Relative %: 50 %
Platelets: 315 10*3/uL (ref 150–400)
RBC: 5.15 MIL/uL (ref 4.22–5.81)
RDW: 13.4 % (ref 11.5–15.5)
WBC: 11.3 10*3/uL — ABNORMAL HIGH (ref 4.0–10.5)
nRBC: 0 % (ref 0.0–0.2)

## 2018-09-03 LAB — COMPREHENSIVE METABOLIC PANEL
ALT: 13 U/L (ref 0–44)
AST: 14 U/L — ABNORMAL LOW (ref 15–41)
Albumin: 4 g/dL (ref 3.5–5.0)
Alkaline Phosphatase: 106 U/L (ref 38–126)
Anion gap: 10 (ref 5–15)
BUN: 14 mg/dL (ref 8–23)
CO2: 26 mmol/L (ref 22–32)
Calcium: 9 mg/dL (ref 8.9–10.3)
Chloride: 104 mmol/L (ref 98–111)
Creatinine, Ser: 1.19 mg/dL (ref 0.61–1.24)
GFR calc Af Amer: >60 mL/min (ref >60)
GFR calc non Af Amer: >60 mL/min (ref >60)
Glucose, Bld: 230 mg/dL — ABNORMAL HIGH (ref 70–99)
Potassium: 4.3 mmol/L (ref 3.5–5.1)
Sodium: 140 mmol/L (ref 135–145)
Total Bilirubin: 0.4 mg/dL (ref 0.3–1.2)
Total Protein: 7.4 g/dL (ref 6.5–8.1)

## 2018-09-03 LAB — MAGNESIUM: Magnesium: 2.2 mg/dL (ref 1.7–2.4)

## 2018-09-03 LAB — TSH: TSH: 2.879 u[IU]/mL (ref 0.350–4.500)

## 2018-09-03 MED ORDER — SODIUM CHLORIDE 0.9% FLUSH
10.0000 mL | INTRAVENOUS | Status: DC | PRN
  Administered 2018-09-03: 10 mL
  Filled 2018-09-03: qty 10

## 2018-09-03 MED ORDER — HEPARIN SOD (PORK) LOCK FLUSH 100 UNIT/ML IV SOLN
500.0000 [IU] | Freq: Once | INTRAVENOUS | Status: AC | PRN
  Administered 2018-09-03: 500 [IU]

## 2018-09-03 MED ORDER — SODIUM CHLORIDE 0.9 % IV SOLN
200.0000 mg | Freq: Once | INTRAVENOUS | Status: AC
  Administered 2018-09-03: 200 mg via INTRAVENOUS
  Filled 2018-09-03: qty 8

## 2018-09-03 MED ORDER — SODIUM CHLORIDE 0.9 % IV SOLN
Freq: Once | INTRAVENOUS | Status: AC
  Administered 2018-09-03: 09:00:00 via INTRAVENOUS

## 2018-09-03 NOTE — Patient Instructions (Signed)
[facility] Discharge Instructions for Patients Receiving Chemotherapy  Today you received the following chemotherapy agents  If you develop nausea and vomiting that is not controlled by your nausea medication, call the clinic.   BELOW ARE SYMPTOMS THAT SHOULD BE REPORTED IMMEDIATELY:  *FEVER GREATER THAN 100.5 F  *CHILLS WITH OR WITHOUT FEVER  NAUSEA AND VOMITING THAT IS NOT CONTROLLED WITH YOUR NAUSEA MEDICATION  *UNUSUAL SHORTNESS OF BREATH  *UNUSUAL BRUISING OR BLEEDING  TENDERNESS IN MOUTH AND THROAT WITH OR WITHOUT PRESENCE OF ULCERS  *URINARY PROBLEMS  *BOWEL PROBLEMS  UNUSUAL RASH Items with * indicate a potential emergency and should be followed up as soon as possible.  Feel free to call the clinic should you have any questions or concerns. The clinic phone number is [phone].  Please show the CHEMO ALERT CARD at check-in to the Emergency Department and triage nurse.   

## 2018-09-03 NOTE — Progress Notes (Signed)
Patient tolerated chemotherapy with no complaints voiced.  Port site clean and dry with no bruising or swelling noted at site.   Band aid applied.  Patient left ambulatory with VSS and no s/s of distress noted.

## 2018-09-10 ENCOUNTER — Encounter: Payer: Self-pay | Admitting: General Practice

## 2018-09-10 NOTE — Progress Notes (Signed)
[facility] CSW Progress Notes  Patient reports food insecurity,called patient to see if he would like to enroll in [facility] food box program, left VM w my contact information.   Edwyna Shell, LCSW Clinical Social Worker Phone:  [phone]

## 2018-09-21 ENCOUNTER — Other Ambulatory Visit (HOSPITAL_COMMUNITY): Payer: Medicare Other

## 2018-09-21 ENCOUNTER — Ambulatory Visit (HOSPITAL_COMMUNITY)
Admission: RE | Admit: 2018-09-21 | Discharge: 2018-09-21 | Disposition: A | Discharge status: Home / Self Care | Payer: Medicare Other | Source: Ambulatory Visit | Attending: Nurse Practitioner | Admitting: Nurse Practitioner

## 2018-09-21 ENCOUNTER — Other Ambulatory Visit: Payer: Self-pay

## 2018-09-21 DIAGNOSIS — K429 Umbilical hernia without obstruction or gangrene: Secondary | ICD-10-CM | POA: Diagnosis not present

## 2018-09-21 DIAGNOSIS — C3411 Malignant neoplasm of upper lobe, right bronchus or lung: Secondary | ICD-10-CM

## 2018-09-21 MED ORDER — IOHEXOL 300 MG/ML  SOLN
100.0000 mL | Freq: Once | INTRAMUSCULAR | Status: AC | PRN
  Administered 2018-09-21: 100 mL via INTRAVENOUS

## 2018-09-24 ENCOUNTER — Other Ambulatory Visit (HOSPITAL_COMMUNITY): Payer: Medicare Other

## 2018-09-24 ENCOUNTER — Inpatient Hospital Stay (HOSPITAL_COMMUNITY): Payer: Medicare Other | Attending: Hematology | Admitting: Hematology

## 2018-09-24 ENCOUNTER — Other Ambulatory Visit: Payer: Self-pay

## 2018-09-24 ENCOUNTER — Encounter (HOSPITAL_COMMUNITY): Payer: Self-pay | Admitting: Hematology

## 2018-09-24 ENCOUNTER — Inpatient Hospital Stay (HOSPITAL_COMMUNITY): Payer: Medicare Other

## 2018-09-24 DIAGNOSIS — Z5112 Encounter for antineoplastic immunotherapy: Secondary | ICD-10-CM | POA: Insufficient documentation

## 2018-09-24 DIAGNOSIS — C7802 Secondary malignant neoplasm of left lung: Secondary | ICD-10-CM | POA: Insufficient documentation

## 2018-09-24 DIAGNOSIS — C3411 Malignant neoplasm of upper lobe, right bronchus or lung: Secondary | ICD-10-CM | POA: Insufficient documentation

## 2018-09-24 DIAGNOSIS — F17210 Nicotine dependence, cigarettes, uncomplicated: Secondary | ICD-10-CM | POA: Insufficient documentation

## 2018-09-24 DIAGNOSIS — Z79899 Other long term (current) drug therapy: Secondary | ICD-10-CM

## 2018-09-24 DIAGNOSIS — C785 Secondary malignant neoplasm of large intestine and rectum: Secondary | ICD-10-CM | POA: Diagnosis not present

## 2018-09-24 DIAGNOSIS — L299 Pruritus, unspecified: Secondary | ICD-10-CM

## 2018-09-24 LAB — COMPREHENSIVE METABOLIC PANEL
ALT: 13 U/L (ref 0–44)
AST: 14 U/L — ABNORMAL LOW (ref 15–41)
Albumin: 4 g/dL (ref 3.5–5.0)
Alkaline Phosphatase: 95 U/L (ref 38–126)
Anion gap: 13 (ref 5–15)
BUN: 13 mg/dL (ref 8–23)
CO2: 22 mmol/L (ref 22–32)
Calcium: 9 mg/dL (ref 8.9–10.3)
Chloride: 102 mmol/L (ref 98–111)
Creatinine, Ser: 1.12 mg/dL (ref 0.61–1.24)
GFR calc Af Amer: >60 mL/min (ref >60)
GFR calc non Af Amer: >60 mL/min (ref >60)
Glucose, Bld: 135 mg/dL — ABNORMAL HIGH (ref 70–99)
Potassium: 3.9 mmol/L (ref 3.5–5.1)
Sodium: 137 mmol/L (ref 135–145)
Total Bilirubin: 0.5 mg/dL (ref 0.3–1.2)
Total Protein: 7.3 g/dL (ref 6.5–8.1)

## 2018-09-24 LAB — CBC WITH DIFFERENTIAL/PLATELET
Abs Immature Granulocytes: 0.06 10*3/uL (ref 0.00–0.07)
Basophils Absolute: 0.1 10*3/uL (ref 0.0–0.1)
Basophils Relative: 1 %
Eosinophils Absolute: 0.5 10*3/uL (ref 0.0–0.5)
Eosinophils Relative: 4 %
HCT: 45.2 % (ref 39.0–52.0)
Hemoglobin: 15 g/dL (ref 13.0–17.0)
Immature Granulocytes: 1 %
Lymphocytes Relative: 35 %
Lymphs Abs: 4.5 10*3/uL — ABNORMAL HIGH (ref 0.7–4.0)
MCH: 28.4 pg (ref 26.0–34.0)
MCHC: 33.2 g/dL (ref 30.0–36.0)
MCV: 85.6 fL (ref 80.0–100.0)
Monocytes Absolute: 1 10*3/uL (ref 0.1–1.0)
Monocytes Relative: 8 %
Neutro Abs: 6.8 10*3/uL (ref 1.7–7.7)
Neutrophils Relative %: 51 %
Platelets: 305 10*3/uL (ref 150–400)
RBC: 5.28 MIL/uL (ref 4.22–5.81)
RDW: 12.9 % (ref 11.5–15.5)
WBC: 12.9 10*3/uL — ABNORMAL HIGH (ref 4.0–10.5)
nRBC: 0 % (ref 0.0–0.2)

## 2018-09-24 LAB — MAGNESIUM: Magnesium: 2 mg/dL (ref 1.7–2.4)

## 2018-09-24 MED ORDER — SODIUM CHLORIDE 0.9% FLUSH
10.0000 mL | INTRAVENOUS | Status: DC | PRN
  Administered 2018-09-24: 10 mL
  Filled 2018-09-24: qty 10

## 2018-09-24 MED ORDER — SODIUM CHLORIDE 0.9 % IV SOLN
200.0000 mg | Freq: Once | INTRAVENOUS | Status: AC
  Administered 2018-09-24: 200 mg via INTRAVENOUS
  Filled 2018-09-24: qty 8

## 2018-09-24 MED ORDER — SODIUM CHLORIDE 0.9 % IV SOLN
Freq: Once | INTRAVENOUS | Status: AC
  Administered 2018-09-24: 10:00:00 via INTRAVENOUS

## 2018-09-24 MED ORDER — HEPARIN SOD (PORK) LOCK FLUSH 100 UNIT/ML IV SOLN
500.0000 [IU] | Freq: Once | INTRAVENOUS | Status: AC | PRN
  Administered 2018-09-24: 500 [IU]

## 2018-09-24 NOTE — Progress Notes (Signed)
To treatment area with no complaints voiced.  Patient seen by Dr. Delton Coombes with lab review and ok to treat verbal order.  No s/s of distress noted.   Patient tolerated chemotherapy with no complaints voiced.  Port site clean and dry with no bruising or swelling noted at site.  No blood return noted before and after administration of chemotherapy.  Patient denied pain with flush. Band aid applied.  Patient left ambulatory with VSS and no s/s of distress noted.

## 2018-09-24 NOTE — Patient Instructions (Addendum)
[place] at [facility] Discharge Instructions  You were seen today by Dr. Delton Coombes. He went over your recent lab results. He will see you back in 3 weeks for labs and follow up. He discussed changing your treatment if scan does not look better.   Thank you for choosing [place] at [facility] to provide your oncology and hematology care.  To afford each patient quality time with our provider, please arrive at least 15 minutes before your scheduled appointment time.   If you have a lab appointment with the [facility] please come in thru the  Main Entrance and check in at the main information desk  You need to re-schedule your appointment should you arrive 10 or more minutes late.  We strive to give you quality time with our providers, and arriving late affects you and other patients whose appointments are after yours.  Also, if you no show three or more times for appointments you may be dismissed from the clinic at the providers discretion.     Again, thank you for choosing [facility].  Our hope is that these requests will decrease the amount of time that you wait before being seen by our physicians.       _____________________________________________________________  Should you have questions after your visit to [facility], please contact our office at [phone] between the hours of 8:00 a.m. and 4:30 p.m.  Voicemails left after 4:00 p.m. will not be returned until the following business day.  For prescription refill requests, have your pharmacy contact our office and allow 72 hours.    Cancer Center Support Programs:   > Cancer Support Group  2nd Tuesday of the month 1pm-2pm, Journey Room

## 2018-09-24 NOTE — Progress Notes (Signed)
Labs drawn peripherally per orders.

## 2018-09-24 NOTE — Assessment & Plan Note (Addendum)
1.  Stage IV adenocarcinoma of the lung, PDL 1-50%, other actionable mutations negative: -Metastatic disease in the right upper lobe, mediastinal and hilar adenopathy, descending mesocolon, left upper buttock subcutaneous tissue, left upper lobe, right subscapularis muscle, left adrenal gland, right breast subcutaneous tissue - Keytruda started on 05/17/2016,  CT scan on 07/09/2017 showing decrease in size of lung nodules, 2 nodules in the serosa of the sigmoid colon, status post colonoscopy which showed submucosal 12 mm firm lesion in the sigmoid colon, no biopsies done. - He is tolerating Keytruda very well.  He does not report any immunotherapy related side effects. -I have reviewed CT scan images dated 09/21/2018 and compared with previous CT on 06/09/2018.  There is slight progression of the right upper lobe mass.  This was more clear when we compare the scan with September 2019 scans. -I discussed with the patient option of continuing Keytruda versus changing therapy at this time.  He was apprehensive to try any chemotherapy because of side effects. -We plan to continue Keytruda at this time and plan to repeat scans in 2 to 3 months.

## 2018-09-24 NOTE — Progress Notes (Signed)
Darrell Christensen, Darrell Christensen [id number]   CLINIC:  Medical Oncology/Hematology  PCP:  Celene Squibb, MD [place] [address] [phone]   REASON FOR VISIT:  Follow-up for stage IV adenocarcinoma of the right lung   BRIEF ONCOLOGIC HISTORY:    Primary cancer of right upper lobe of lung ([place])   05/14/2015 Procedure    Port placement by Dr. Arnoldo Morale    04/01/2016 Initial Diagnosis    Primary cancer of right upper lobe of lung ([place])    04/02/2016 Procedure    Ultrasound-guided core biopsy performed of a solid 1.5 cm soft tissue nodule in the right chest wall.    04/03/2016 Imaging    MRI brain- No acute and intracranial process or metastasis.  Moderate chronic small vessel ischemic disease.    04/04/2016 Pathology Results    Diagnosis Soft Tissue Needle Core Biopsy, Right Chest Wall SMOOTH MUSCLE NEOPLASM Microscopic Comment The differential diagnosis include low grade leiomyosarcoma. The neoplasm shows spindle cell morphology with rare mitotic figures and minimal atypia, no necrosis present. These cells stains positive for smooth muscle actin, desmin, negative for cytokeratin AE1&3, ck8/18, s100, cd117, cd 34 and cd99. This case also reviewed by Dr. [name] and agree.    04/05/2016 Procedure    CT-guided biopsy of right upper lobe lesion, with tissue specimen sent to pathology, by IR.    04/09/2016 Pathology Results    Lung, needle/core biopsy(ies), Right Upper Lobe - NON SMALL CELL CARCINOMA.    04/17/2016 Pathology Results    PDL1 High Expression.  TPS 50%.    04/24/2016 PET scan    1. Prominently hypermetabolic large right upper lobe mass with metastatic disease to the right paratracheal and right hilar nodal chains, to the descending mesocolon, to the left upper buttock subcutaneous tissues, to the left upper lobe, to the right subscapularis muscle, and possibly to the left adrenal gland and right breast  subcutaneous tissues. Appearance compatible with stage IV lung cancer. 2. There is some focal high activity in the ascending colon which is probably from peristalsis, less likely from a local colon mass. This does raise the possibility that the adjacent mesocolon tumor implant could be due to synchronous colon cancer rather than lung metastasis. 3. Other imaging findings of potential clinical significance: Coronary, aortic arch, and branch vessel atherosclerotic vascular disease. Aortoiliac atherosclerotic vascular disease. Deformity left proximal humerus and left glenohumeral joint from prior trauma. Enlarged prostate gland.    04/26/2016 Pathology Results    FoundationONE: Genomic alterations identified- KRAS B28U, CREBBP splice site [id number], LRP1B S515*, UUV25 D66*, YQI34 splice site [id number], SPTA1 splice site [id number], TP53 C135Y.  Additional findings- MS-Stable, TMB-intermediate (14 Muts/Mb).  No reportable alterations- EGFR, ALK, BRAF, MET, ERBB2, RET, ROS1.    05/17/2016 -  Chemotherapy    The patient had ONDANSETRON IVPB [facility] +/- DEXAMETHASONE, , Intravenous,  Once, 0 of 1 cycle  pembrolizumab (KEYTRUDA) 200 mg in sodium chloride 0.9 % 50 mL chemo infusion, 200 mg, Intravenous, Once, 0 of 5 cycles  for chemotherapy treatment.      08/06/2016 PET scan    IMPRESSION: 1. Overall considerable improvement. The right upper lobe mass is markedly reduced in volume and SUV. Thoracic adenopathy have resolved and the hypermetabolic lesion in the right subscapularis muscle has also resolved. 2. The left upper lobe nodule is no longer appreciably hypermetabolic, and is highly indistinct although this may be due to motion artifact. Faint in  indistinct nodularity elsewhere in the lungs likewise not currently hypermetabolic. 3. There is some very faint residual low-grade activity along the subcutaneous deposit along the upper left buttock region. Marked reduction in size and  activity of the tumor deposit adjacent to the descending colon. 4. Prior right pleural effusion has resolved. 5. Stable appearance of the adrenal glands, with low-level activity and a nodule in the left adrenal gland. 6. Stable small nodule in the right breast, low-grade metabolic activity with maximum SUV 2.2 (formerly 3.0) 7. Other imaging findings of potential clinical significance: Severe arthropathy of the left glenohumeral joint. Coronary, aortic arch, and branch vessel atherosclerotic vascular disease. Aortoiliac atherosclerotic vascular disease. Prominent prostate gland indents the bladder base.      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  Two adjacent large masslike areas of consolidation within the right upper lobe with differential considerations including malignancy or pneumonia. Multiple enlarged right hilar and mediastinal lymph nodes which may be reactive or metastatic in etiology.  Additional indeterminate pulmonary nodules as above. Recommend attention on follow-up.  Indeterminate 2.9 cm left adrenal nodule. This needs dedicated evaluation with pre and post contrast-enhanced CT or MRI after confirmation of pulmonary process.  Indeterminate nodule within the right breast. Recommend dedicated evaluation with mammography.  Hepatic steatosis.    04/02/2016 Procedure    Ultrasound-guided core biopsy performed of a solid 1.5 cm soft tissue nodule in the right chest wall.    04/04/2016 Pathology Results    Diagnosis Soft Tissue Needle Core Biopsy, Right Chest Wall SMOOTH MUSCLE NEOPLASM Microscopic Comment The differential diagnosis include low grade leiomyosarcoma. The neoplasm shows spindle cell morphology with rare mitotic figures and minimal atypia, no necrosis present. These cells stains positive for smooth muscle  actin, desmin, negative for cytokeratin AE1&3, ck8/18, s100, cd117, cd 34 and cd99. This case also reviewed by Dr. [name] and agree.       CANCER STAGING: Cancer Staging Primary cancer of right upper lobe of lung ([place]) Staging form: Lung, AJCC 8th Edition - Clinical: Stage IVB (cT3, cN2, cM1c) - Signed by [facility], PA-C on 05/07/2016    INTERVAL HISTORY:  Mr. Vallez 73 y.o. male returns for routine follow-up and consideration for next cycle of chemotherapy. He is here today alone. He states that he has noticed some swelling in his feet and legs. He states that he has some shortness of breath with exertion. Denies any nausea, vomiting, or diarrhea. Denies any new pains. Had not noticed any recent bleeding such as epistaxis, hematuria or hematochezia. Denies recent chest pain on exertion, pre-syncopal episodes, or palpitations. Denies any numbness or tingling in hands or feet. Denies any recent fevers, infections, or recent hospitalizations. Patient reports appetite at 0% and energy level at 0%.     REVIEW OF SYSTEMS:  Review of Systems  Respiratory: Positive for shortness of breath.   Cardiovascular: Positive for leg swelling.  Skin: Positive for itching.     PAST MEDICAL/SURGICAL HISTORY:  Past Medical History:  Diagnosis Date  . Adrenal mass, left ([place]) 04/01/2016  . Diabetes mellitus ([place]) 05/02/2016  . Diabetes mellitus without complication ([place])   . Hypertension   . Mass of breast, right 04/01/2016  . Mass of upper lobe of right lung 04/01/2016   2 adjacent, 5 cm masses, hilar adenopathy  . Primary cancer of right upper lobe of lung ([place])  04/01/2016   2 adjacent, 5 cm masses, hilar adenopathy  . Pulmonary embolus ([place]) 03/31/2016  . Spindle cell sarcoma [facility])    Past Surgical History:  Procedure Laterality Date  . COLONOSCOPY N/A 07/28/2017   Procedure: COLONOSCOPY;  Surgeon: Rogene Houston, MD;  Location: [facility] ENDO SUITE;  Service: Endoscopy;  Laterality: N/A;   205  . PORTACATH PLACEMENT Left 05/13/2016   Procedure: INSERTION PORT-A-CATH;  Surgeon: Aviva Signs, MD;  Location: [facility] ORS;  Service: General;  Laterality: Left;     SOCIAL HISTORY:  Social History   Socioeconomic History  . Marital status: Divorced    Spouse name: Not on file  . Number of children: Not on file  . Years of education: Not on file  . Highest education level: Not on file  Occupational History  . Not on file  Social Needs  . Financial resource strain: Not on file  . Food insecurity:    Worry: Not on file    Inability: Not on file  . Transportation needs:    Medical: Not on file    Non-medical: Not on file  Tobacco Use  . Smoking status: Current Every Day Smoker    Years: 61.00    Types: E-cigarettes    Last attempt to quit: 04/11/2014    Years since quitting: 4.4  . Smokeless tobacco: Never Used  . Tobacco comment: patient does vape smoking x 2 years  Substance and Sexual Activity  . Alcohol use: No    Comment: quit 10 years  . Drug use: No  . Sexual activity: Not on file  Lifestyle  . Physical activity:    Days per week: Not on file    Minutes per session: Not on file  . Stress: Not on file  Relationships  . Social connections:    Talks on phone: Not on file    Gets together: Not on file    Attends religious service: Not on file    Active member of club or organization: Not on file    Attends meetings of clubs or organizations: Not on file    Relationship status: Not on file  . Intimate partner violence:    Fear of current or ex partner: Not on file    Emotionally abused: Not on file    Physically abused: Not on file    Forced sexual activity: Not on file  Other Topics Concern  . Not on file  Social History Narrative  . Not on file    FAMILY HISTORY:  Family History  Problem Relation Age of Onset  . Stroke Mother   . Heart attack Father   . Heart attack Brother     CURRENT MEDICATIONS:  Outpatient Encounter Medications as of  09/24/2018  Medication Sig  . ALPRAZolam (XANAX) 1 MG tablet Take 1 mg by mouth 3 (three) times daily as needed for anxiety.  Marland Kitchen amLODipine (NORVASC) 2.5 MG tablet Take 2.5 mg by mouth daily.  . diphenoxylate-atropine (LOMOTIL) 2.5-0.025 MG tablet Take 1 tablet 4 (four) times daily as needed by mouth for diarrhea or loose stools.  . furosemide (LASIX) 20 MG tablet Take 2 tablets (40 mg total) by mouth daily.  Marland Kitchen GLIPIZIDE XL 5 MG 24 hr tablet Take 5 mg by mouth daily.  . hydrocortisone cream 0.5 % Apply 1 application topically 2 (two) times daily.  . hydrOXYzine (ATARAX/VISTARIL) 25 MG tablet Take 1 tablet (25 mg total) by mouth 3 (three) times daily as needed.  . lidocaine-prilocaine (  EMLA) cream Apply a quarter size amount to affected area 1 hour prior to coming to chemotherapy. Cover with plastic wrap.  . metFORMIN (GLUCOPHAGE) 500 MG tablet Take 500 mg by mouth 2 (two) times daily.  . metoprolol succinate (TOPROL-XL) 25 MG 24 hr tablet TAKE ONE TABLET BY MOUTH IN THE EVENING.  Marland Kitchen ondansetron (ZOFRAN) 8 MG tablet Take 8 mg by mouth every 8 (eight) hours as needed for nausea or vomiting.  Marland Kitchen oxyCODONE-acetaminophen (PERCOCET) 10-325 MG tablet   . Pembrolizumab (KEYTRUDA IV) Inject into the vein.  . potassium chloride (K-DUR,KLOR-CON) 10 MEQ tablet   . prochlorperazine (COMPAZINE) 10 MG tablet   . simvastatin (ZOCOR) 20 MG tablet Take 20 mg by mouth every evening.  . Skin Protectants, Misc. (EUCERIN) cream Apply topically as needed for dry skin.  Marland Kitchen spironolactone (ALDACTONE) 50 MG tablet Take 1 tablet (50 mg total) by mouth daily.  Marland Kitchen triamcinolone cream (KENALOG) 0.1 % Apply 1 application topically 2 (two) times daily.   No facility-administered encounter medications on file as of 09/24/2018.     ALLERGIES:  No Known Allergies   PHYSICAL EXAM:  ECOG Performance status: 1  Vitals:   09/24/18 0832  BP: (!) 156/66  Pulse: 93  Resp: 16  Temp: 98.7 F (37.1 C)  SpO2: 96%   Filed  Weights   09/24/18 0832  Weight: 232 lb 6.4 oz (105.4 kg)    Physical Exam Vitals signs reviewed.  Constitutional:      Appearance: Normal appearance.  Cardiovascular:     Rate and Rhythm: Normal rate and regular rhythm.     Heart sounds: Normal heart sounds.  Pulmonary:     Effort: Pulmonary effort is normal.     Breath sounds: Normal breath sounds.  Abdominal:     General: There is no distension.     Palpations: Abdomen is soft. There is no mass.  Musculoskeletal:        General: No swelling.  Skin:    General: Skin is warm.  Neurological:     General: No focal deficit present.     Mental Status: He is alert and oriented to person, place, and time.  Psychiatric:        Mood and Affect: Mood normal.        Behavior: Behavior normal.      LABORATORY DATA:  I have reviewed the labs as listed.  CBC    Component Value Date/Time   WBC 12.9 (H) 09/24/2018 0851   RBC 5.28 09/24/2018 0851   HGB 15.0 09/24/2018 0851   HGB 13.5 05/02/2016 0852   HCT 45.2 09/24/2018 0851   HCT 41.0 05/02/2016 0852   PLT 305 09/24/2018 0851   PLT 436 (H) 05/02/2016 0852   MCV 85.6 09/24/2018 0851   MCV 85.7 05/02/2016 0852   MCH 28.4 09/24/2018 0851   MCHC 33.2 09/24/2018 0851   RDW 12.9 09/24/2018 0851   RDW 13.9 05/02/2016 0852   LYMPHSABS 4.5 (H) 09/24/2018 0851   LYMPHSABS 3.4 (H) 05/02/2016 0852   MONOABS 1.0 09/24/2018 0851   MONOABS 1.2 (H) 05/02/2016 0852   EOSABS 0.5 09/24/2018 0851   EOSABS 1.6 (H) 05/02/2016 0852   BASOSABS 0.1 09/24/2018 0851   BASOSABS 0.2 (H) 05/02/2016 0852   CMP Latest Ref Rng & Units 09/24/2018 09/03/2018 08/13/2018  Glucose 70 - 99 mg/dL 135(H) 230(H) 176(H)  BUN 8 - 23 mg/dL '13 14 17  ' Creatinine 0.61 - 1.24 mg/dL 1.12 1.19 1.16  Sodium 135 -  145 mmol/L 137 140 142  Potassium 3.5 - 5.1 mmol/L 3.9 4.3 3.7  Chloride 98 - 111 mmol/L 102 104 105  CO2 22 - 32 mmol/L '22 26 26  ' Calcium 8.9 - 10.3 mg/dL 9.0 9.0 9.1  Total Protein 6.5 - 8.1 g/dL 7.3 7.4  7.2  Total Bilirubin 0.3 - 1.2 mg/dL 0.5 0.4 0.2(L)  Alkaline Phos 38 - 126 U/L 95 106 102  AST 15 - 41 U/L 14(L) 14(L) 18  ALT 0 - 44 U/L '13 13 16       ' DIAGNOSTIC IMAGING:  I have independently reviewed the scans and discussed with the patient.   I have reviewed Venita Lick LPN's note and agree with the documentation.  I personally performed a face-to-face visit, made revisions and my assessment and plan is as follows.    ASSESSMENT & PLAN:   Primary cancer of right upper lobe of lung ([place]) 1.  Stage IV adenocarcinoma of the lung, PDL 1-50%, other actionable mutations negative: -Metastatic disease in the right upper lobe, mediastinal and hilar adenopathy, descending mesocolon, left upper buttock subcutaneous tissue, left upper lobe, right subscapularis muscle, left adrenal gland, right breast subcutaneous tissue - Keytruda started on 05/17/2016,  CT scan on 07/09/2017 showing decrease in size of lung nodules, 2 nodules in the serosa of the sigmoid colon, status post colonoscopy which showed submucosal 12 mm firm lesion in the sigmoid colon, no biopsies done. - He is tolerating Keytruda very well.  He does not report any immunotherapy related side effects. -I have reviewed CT scan images dated 09/21/2018 and compared with previous CT on 06/09/2018.  There is slight progression of the right upper lobe mass.  This was more clear when we compare the scan with September 2019 scans. -I discussed with the patient option of continuing Keytruda versus changing therapy at this time.  He was apprehensive to try any chemotherapy because of side effects. -We plan to continue Keytruda at this time and plan to repeat scans in 2 to 3 months.    Total time spent is 25 minutes with more than 50% of the time spent discussing scan results, therapy plan and coordination of care.    Orders placed this encounter:  No orders of the defined types were placed in this encounter.     Derek Jack,  MD [place] [phone]

## 2018-09-29 ENCOUNTER — Telehealth: Payer: Self-pay | Admitting: Medical Oncology

## 2018-09-29 DIAGNOSIS — D72829 Elevated white blood cell count, unspecified: Secondary | ICD-10-CM | POA: Diagnosis not present

## 2018-09-29 DIAGNOSIS — E1165 Type 2 diabetes mellitus with hyperglycemia: Secondary | ICD-10-CM | POA: Diagnosis not present

## 2018-09-29 DIAGNOSIS — I10 Essential (primary) hypertension: Secondary | ICD-10-CM | POA: Diagnosis not present

## 2018-09-29 DIAGNOSIS — E782 Mixed hyperlipidemia: Secondary | ICD-10-CM | POA: Diagnosis not present

## 2018-09-29 NOTE — Telephone Encounter (Signed)
Wife notified.

## 2018-09-29 NOTE — Telephone Encounter (Signed)
He will need a referral for second opinion. We can do by WebEx if needed.

## 2018-09-29 NOTE — Telephone Encounter (Signed)
2nd opinion to Review scans-Darrell Christensen asking if Darrell Christensen will review his scans . She and pt want a second opinion.  Pt sees [place].

## 2018-10-01 ENCOUNTER — Telehealth: Payer: Self-pay | Admitting: Internal Medicine

## 2018-10-01 NOTE — Telephone Encounter (Signed)
A new patient appt has been scheduled for the pt to see Dr. Julien Nordmann for a 2nd opinion on 5/29 at 10am. Appt date and time has been given to the pt's daughter, Costella Hatcher. She's agreed to the appt date and time. Aware to arrive 15 minutes early.

## 2018-10-02 ENCOUNTER — Encounter: Payer: Self-pay | Admitting: Internal Medicine

## 2018-10-02 ENCOUNTER — Inpatient Hospital Stay: Payer: Medicare Other | Attending: Internal Medicine | Admitting: Internal Medicine

## 2018-10-02 ENCOUNTER — Other Ambulatory Visit: Payer: Self-pay

## 2018-10-02 VITALS — BP 157/92 | HR 76 | Temp 98.9°F | Resp 20 | Ht 71.0 in | Wt 231.3 lb

## 2018-10-02 DIAGNOSIS — F17210 Nicotine dependence, cigarettes, uncomplicated: Secondary | ICD-10-CM

## 2018-10-02 DIAGNOSIS — C3411 Malignant neoplasm of upper lobe, right bronchus or lung: Secondary | ICD-10-CM | POA: Diagnosis not present

## 2018-10-02 DIAGNOSIS — C7802 Secondary malignant neoplasm of left lung: Secondary | ICD-10-CM | POA: Diagnosis not present

## 2018-10-02 DIAGNOSIS — C785 Secondary malignant neoplasm of large intestine and rectum: Secondary | ICD-10-CM

## 2018-10-02 DIAGNOSIS — E782 Mixed hyperlipidemia: Secondary | ICD-10-CM | POA: Diagnosis not present

## 2018-10-02 DIAGNOSIS — I10 Essential (primary) hypertension: Secondary | ICD-10-CM

## 2018-10-02 DIAGNOSIS — R944 Abnormal results of kidney function studies: Secondary | ICD-10-CM | POA: Diagnosis not present

## 2018-10-02 DIAGNOSIS — E1165 Type 2 diabetes mellitus with hyperglycemia: Secondary | ICD-10-CM | POA: Diagnosis not present

## 2018-10-02 DIAGNOSIS — Z79899 Other long term (current) drug therapy: Secondary | ICD-10-CM

## 2018-10-02 NOTE — Progress Notes (Signed)
[place] [phone]   [phone]) [phone]  CONSULT NOTE  REFERRING PHYSICIAN: Dr. Derek Jack  REASON FOR CONSULTATION:  73 years old white male with metastatic lung cancer for second opinion  HPI Darrell Christensen is a 73 y.o. male with past medical history significant for hypertension, diabetes mellitus, history of pulmonary embolism, history of smoking but quit 10 years ago.  He also has a history of melanoma status post resection from the back.  In November 2017 he was complaining of intermittent chest pain with radiation to the back for several months.  He has a chest x-ray followed by CT scan of the chest that showed pulmonary embolism but there was 2 adjacent large masslike areas of consolidation within the right upper lobe.  The first 1 measured 5.4 x 5.6 cm and the second measuring 5.1 x 4.9 cm.  There was also indeterminate 2.9 cm left adrenal nodule.  He also had right breast chest wall mass and biopsy of that lesion was consistent with low-grade leiomyosarcoma.  He also underwent CT-guided core biopsy of the right upper lobe lung mass and it was consistent with poorly differentiated adenocarcinoma.  The patient had no actionable mutation and PDL 1 expression was 50%.  The patient was a started on treatment with Keytruda as monotherapy since that time and he has been tolerating this treatment well except for few episodes of diarrhea as well as itching.  He was treated with topical steroids as well as Atarax for the itching.  His most recent CT scan of the chest, abdomen and pelvis showed some evidence for disease progression with increase in the size of the right upper lobe lung mass as well as right paratracheal lymph node. The patient came today for evaluation and second opinion regarding his condition. When seen today he is feeling fine except for the few episodes of diarrhea and the itching.  He also has some mild visual changes but no headaches.   He denied having any nausea, vomiting or constipation.  He denied having any fever or chills.  He has no chest pain, shortness of breath except with exertion with mild cough and no hemoptysis. Family history significant for mother who died from cerebral hemorrhage and father as well as brother with lung cancer. The patient is single and has 1 daughter, Darrell Christensen who was available by phone during the visit.  The patient used to work as an Clinical biochemist.  He has a history for smoking but quit 5 years ago.  He also has a history of alcohol abuse but quit 10 years ago. 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)     Past Surgical History:  Procedure Laterality Date   COLONOSCOPY N/A 07/28/2017   Procedure: COLONOSCOPY;  Surgeon: Rogene Houston, MD;  Location: [facility];  Service: Endoscopy;  Laterality: N/A;  [place] Left 05/13/2016   Procedure: INSERTION PORT-A-CATH;  Surgeon: Aviva Signs, MD;  Location: [facility] ORS;  Service: General;  Laterality: Left;    Family History  Problem Relation Age of Onset   Stroke Mother    Heart attack Father    Heart attack Brother     Social History Social History   Tobacco Use   Smoking status: Current Every Day Smoker    Years: 61.00    Types: E-cigarettes    Last attempt to quit: 04/11/2014    Years since quitting: 4.4   Smokeless tobacco: Never Used   Tobacco comment: patient does vape smoking x 2 years  Substance Use Topics   Alcohol use: No    Comment: quit 10 years    Drug use: No    No Known Allergies  Current Outpatient Medications  Medication Sig Dispense Refill   ALPRAZolam (XANAX) 1 MG tablet Take 1 mg by mouth 3 (three) times daily as needed for anxiety.     amLODipine (NORVASC) 2.5 MG tablet Take 2.5 mg by mouth daily.  4   diphenoxylate-atropine (LOMOTIL) 2.5-0.025 MG tablet Take 1 tablet 4 (four) times daily as needed by mouth for diarrhea or loose stools. 60 tablet 0   furosemide (LASIX) 20 MG tablet Take 2 tablets (40 mg total) by mouth daily. 20 tablet 0   GLIPIZIDE XL 5 MG 24 hr tablet Take 5 mg by mouth daily.  4   hydrocortisone cream 0.5 % Apply 1 application topically 2 (two) times daily. 30 g 0   hydrOXYzine (ATARAX/VISTARIL) 25 MG tablet Take 1 tablet (25 mg total) by mouth 3 (three) times daily as needed. 45 tablet 3   lidocaine-prilocaine (EMLA) cream Apply a quarter size amount to affected area 1 hour prior to coming to chemotherapy. Cover with plastic wrap. 30 g 2   metFORMIN (GLUCOPHAGE) 500 MG tablet Take 500 mg by mouth 2 (two) times daily.  4   metoprolol succinate (TOPROL-XL) 25 MG 24 hr tablet TAKE ONE TABLET BY MOUTH IN THE EVENING. 30 tablet 0   ondansetron (ZOFRAN) 8 MG tablet Take 8 mg by mouth every 8 (eight) hours as needed for nausea or vomiting.     oxyCODONE-acetaminophen (PERCOCET) 10-325 MG tablet   0   Pembrolizumab (KEYTRUDA IV) Inject into the vein.     potassium chloride (K-DUR,KLOR-CON) 10 MEQ tablet   0   prochlorperazine (COMPAZINE) 10 MG tablet   1   simvastatin (ZOCOR) 20 MG tablet Take 20 mg by mouth every evening.  5   Skin Protectants, Misc. (EUCERIN) cream Apply topically as needed for dry skin. 454 g 1   spironolactone (ALDACTONE) 50 MG tablet Take 1 tablet (50 mg total) by mouth daily. 10 tablet 0   triamcinolone cream (KENALOG) 0.1 % Apply 1 application topically 2 (two) times daily.     No current facility-administered medications for this visit.     Review of  Systems  Constitutional: positive for fatigue Eyes: negative Ears, nose, mouth, throat, and face: negative Respiratory: positive for cough and dyspnea on exertion Cardiovascular: negative Gastrointestinal: positive for diarrhea Genitourinary:negative Integument/breast: positive for pruritus Hematologic/lymphatic: negative Musculoskeletal:negative Neurological: negative Behavioral/Psych: negative Endocrine: negative Allergic/Immunologic: negative  Physical Exam  WCB:JSEGB, healthy, no distress, well nourished, well developed and anxious SKIN: skin color, texture, turgor are normal, no rashes or significant lesions HEAD: Normocephalic, No masses, lesions, tenderness or abnormalities EYES: normal, PERRLA, Conjunctiva are pink and non-injected EARS: External ears normal, Canals clear OROPHARYNX:no exudate, no erythema and lips, buccal mucosa, and tongue normal  NECK: supple, no adenopathy, no JVD LYMPH:  no palpable lymphadenopathy, no hepatosplenomegaly BREAST: Palpable mass under the right nipple. LUNGS: clear to auscultation , and palpation HEART: regular rate & rhythm, no murmurs and no gallops ABDOMEN:abdomen soft, non-tender, obese, normal bowel sounds and no masses or organomegaly BACK: No CVA tenderness, Range of motion is normal EXTREMITIES:no joint deformities, effusion, or inflammation, no edema  NEURO: alert & oriented x 3 with fluent speech, no focal motor/sensory deficits  PERFORMANCE STATUS: ECOG 1  LABORATORY DATA: Lab Results  Component Value Date   WBC 12.9 (H) 09/24/2018   HGB 15.0 09/24/2018   HCT 45.2 09/24/2018   MCV 85.6 09/24/2018   PLT 305 09/24/2018      Chemistry      Component Value Date/Time   NA 137 09/24/2018 0851   NA 139 05/02/2016 0852   K 3.9 09/24/2018 0851   K 4.2 05/02/2016 0852   CL 102 09/24/2018 0851   CO2 22 09/24/2018 0851   CO2 24 05/02/2016 0852   BUN 13 09/24/2018 0851   BUN 10.6 05/02/2016 0852   CREATININE 1.12  09/24/2018 0851   CREATININE 1.0 05/02/2016 0852      Component Value Date/Time   CALCIUM 9.0 09/24/2018 0851   CALCIUM 9.2 05/02/2016 0852   ALKPHOS 95 09/24/2018 0851   ALKPHOS 73 05/02/2016 0852   AST 14 (L) 09/24/2018 0851   AST 12 05/02/2016 0852   ALT 13 09/24/2018 0851   ALT 14 05/02/2016 0852   BILITOT 0.5 09/24/2018 0851   BILITOT 0.42 05/02/2016 0852       RADIOGRAPHIC STUDIES: Ct Chest W Contrast  Result Date: 09/21/2018 CLINICAL DATA:  Metastatic right upper lobe lung cancer. Restaging assessment. EXAM: CT CHEST, ABDOMEN, AND PELVIS WITH CONTRAST TECHNIQUE: Multidetector CT imaging of the chest, abdomen and pelvis was performed following the standard protocol during bolus administration of intravenous contrast. CONTRAST:  120mL OMNIPAQUE IOHEXOL 300 MG/ML  SOLN COMPARISON:  Multiple exams, including 06/09/2018 FINDINGS: CT CHEST FINDINGS Cardiovascular: Coronary, aortic arch, and branch vessel atherosclerotic vascular disease. Mild cardiomegaly. Mediastinum/Nodes: Hypodense right thyroid lesion 1.4 by 1.0 cm, stable. Right paratracheal node 1.2 cm in short axis on image 18/2, formerly 0.8 cm. Scattered smaller lymph nodes especially clustered in the paratracheal region. Lungs/Pleura: Right upper lobe mass 5.2 by 3.9 by 4.0 cm (volume = 42 cm^3), previously by my measurements 4.2 by 3.1 by 3.8 cm (volume = 26 cm^3). The mass abuts the major fissure and pleural margin and extends towards the hilum with some subtle suspected adjacent atelectasis or tumor extending to the hilum. No pleural effusion or chest wall invasion observed. Pleural-based nodule anteriorly along the right upper lobe 0.6 cm in diameter on image 53/3, stable. Subpleural nodule in the right lower lobe along the major fissure, 0.9 by 0.8 cm on image 69/3, stable. Musculoskeletal: Markedly severe arthropathy of the left glenohumeral joint with deformity left proximal humerus. Schmorl's nodes at multiple levels in the  thoracic spine. Small subareolar mass in the right breast 1.9 by 1.5 cm on image 32/2, formerly 1.8 by 1.4 cm. CT ABDOMEN PELVIS FINDINGS Hepatobiliary: Unremarkable Pancreas: Unremarkable Spleen: Punctate calcifications in the spleen compatible with old granulomatous disease. Adrenals/Urinary Tract: Non-specific 3.1 by 1.7 cm left adrenal mass, not appreciably changed over the last several years. Prostate gland indents the bladder base. 6 mm hypodense left kidney upper pole lesion is technically nonspecific although statistically likely to be benign. Stomach/Bowel: Unremarkable Vascular/Lymphatic: Aortoiliac atherosclerotic vascular disease. Reproductive: Mild prostatomegaly with the median lobe indenting  the bladder base. Other: No supplemental non-categorized findings. Musculoskeletal: Lower lumbar degenerative facet arthropathy. Umbilical hernia contains adipose tissues. IMPRESSION: 1. The dominant right upper lobe mass has increased in size compared to the 01/13/2018 exam, suspicious for active malignancy. There is also a mildly enlarged right paratracheal lymph node which was previously upper normal in size. 2. The 2 pleural-based nodules on the right side are stable. The subareolar mass in the right breast was previously not hypermetabolic, and is roughly similar in size to prior exams. 3. Other imaging findings of potential clinical significance: Aortic Atherosclerosis (ICD10-I70.0). Coronary atherosclerosis with mild cardiomegaly. Chronically stable severe left glenohumeral arthropathy. Stable left adrenal mass. Stable prostatomegaly. Umbilical hernia contains adipose tissues. Electronically Signed   By: Van Clines M.D.   On: 09/21/2018 14:22   Ct Abdomen Pelvis W Contrast  Result Date: 09/21/2018 CLINICAL DATA:  Metastatic right upper lobe lung cancer. Restaging assessment. EXAM: CT CHEST, ABDOMEN, AND PELVIS WITH CONTRAST TECHNIQUE: Multidetector CT imaging of the chest, abdomen and pelvis was  performed following the standard protocol during bolus administration of intravenous contrast. CONTRAST:  162mL OMNIPAQUE IOHEXOL 300 MG/ML  SOLN COMPARISON:  Multiple exams, including 06/09/2018 FINDINGS: CT CHEST FINDINGS Cardiovascular: Coronary, aortic arch, and branch vessel atherosclerotic vascular disease. Mild cardiomegaly. Mediastinum/Nodes: Hypodense right thyroid lesion 1.4 by 1.0 cm, stable. Right paratracheal node 1.2 cm in short axis on image 18/2, formerly 0.8 cm. Scattered smaller lymph nodes especially clustered in the paratracheal region. Lungs/Pleura: Right upper lobe mass 5.2 by 3.9 by 4.0 cm (volume = 42 cm^3), previously by my measurements 4.2 by 3.1 by 3.8 cm (volume = 26 cm^3). The mass abuts the major fissure and pleural margin and extends towards the hilum with some subtle suspected adjacent atelectasis or tumor extending to the hilum. No pleural effusion or chest wall invasion observed. Pleural-based nodule anteriorly along the right upper lobe 0.6 cm in diameter on image 53/3, stable. Subpleural nodule in the right lower lobe along the major fissure, 0.9 by 0.8 cm on image 69/3, stable. Musculoskeletal: Markedly severe arthropathy of the left glenohumeral joint with deformity left proximal humerus. Schmorl's nodes at multiple levels in the thoracic spine. Small subareolar mass in the right breast 1.9 by 1.5 cm on image 32/2, formerly 1.8 by 1.4 cm. CT ABDOMEN PELVIS FINDINGS Hepatobiliary: Unremarkable Pancreas: Unremarkable Spleen: Punctate calcifications in the spleen compatible with old granulomatous disease. Adrenals/Urinary Tract: Non-specific 3.1 by 1.7 cm left adrenal mass, not appreciably changed over the last several years. Prostate gland indents the bladder base. 6 mm hypodense left kidney upper pole lesion is technically nonspecific although statistically likely to be benign. Stomach/Bowel: Unremarkable Vascular/Lymphatic: Aortoiliac atherosclerotic vascular disease.  Reproductive: Mild prostatomegaly with the median lobe indenting the bladder base. Other: No supplemental non-categorized findings. Musculoskeletal: Lower lumbar degenerative facet arthropathy. Umbilical hernia contains adipose tissues. IMPRESSION: 1. The dominant right upper lobe mass has increased in size compared to the 01/13/2018 exam, suspicious for active malignancy. There is also a mildly enlarged right paratracheal lymph node which was previously upper normal in size. 2. The 2 pleural-based nodules on the right side are stable. The subareolar mass in the right breast was previously not hypermetabolic, and is roughly similar in size to prior exams. 3. Other imaging findings of potential clinical significance: Aortic Atherosclerosis (ICD10-I70.0). Coronary atherosclerosis with mild cardiomegaly. Chronically stable severe left glenohumeral arthropathy. Stable left adrenal mass. Stable prostatomegaly. Umbilical hernia contains adipose tissues. Electronically Signed   By: Cindra Eves.D.  On: 09/21/2018 14:22    ASSESSMENT: This is a very pleasant 73 years old white male with metastatic non-small cell lung cancer, adenocarcinoma with no actionable mutations and PDL 1 expression of 50% diagnosed in November was 2017. The patient has been on treatment with single agent Keytruda since that time.  He has been doing fine except for mild disease progression on recent imaging studies.  PLAN: I had a lengthy discussion with the patient and his daughter today about his current disease stage, prognosis and treatment options.  I discussed with the patient several options for management of his condition including continues treatment with single agent Keytruda and close monitoring of his disease on upcoming imaging studies and acceptance of mild disease progression as long as the patient is asymptomatic. The second option was to add systemic chemotherapy with carboplatin for AUC of 5 and Alimta 500 mg/M2 to  his current treatment with [facility] for 4 cycles followed by maintenance Alimta and Keytruda as long as the patient had no significant adverse effects or disease progression. The third option is to discontinue immunotherapy completely and switch the patient to systemic chemotherapy with carboplatin and Alimta. After the lengthy discussion the patient and his daughter are interested in adding systemic chemotherapy to his current treatment with Keytruda. I discussed my recommendation with Dr. Delton Coombes and he is scheduled to see the patient on October 15, 2018 for reevaluation and adjustment of his treatment. For the itching and diarrhea the patient will continue with his current treatment as recommended by Dr. Delton Coombes. I advised the patient to call if he has any other concerning symptoms or question. The patient voices understanding of current disease status and treatment options and is in agreement with the current care plan.  All questions were answered. The patient knows to call the clinic with any problems, questions or concerns. We can certainly see the patient much sooner if necessary.  Thank you so much for allowing me to participate in the care of Darrell Christensen. I will continue to follow up the patient with you and assist in his care.  I spent 40 minutes counseling the patient face to face. The total time spent in the appointment was 60 minutes.  Disclaimer: This note was dictated with voice recognition software. Similar sounding words can inadvertently be transcribed and may not be corrected upon review.   Eilleen Kempf Oct 02, 2018, 10:02 AM

## 2018-10-15 ENCOUNTER — Encounter (HOSPITAL_COMMUNITY): Payer: Self-pay | Admitting: Hematology

## 2018-10-15 ENCOUNTER — Other Ambulatory Visit (HOSPITAL_COMMUNITY): Payer: Medicare Other

## 2018-10-15 ENCOUNTER — Inpatient Hospital Stay (HOSPITAL_COMMUNITY): Payer: Medicare Other

## 2018-10-15 ENCOUNTER — Other Ambulatory Visit: Payer: Self-pay

## 2018-10-15 ENCOUNTER — Inpatient Hospital Stay (HOSPITAL_COMMUNITY): Payer: Medicare Other | Attending: Hematology | Admitting: Hematology

## 2018-10-15 DIAGNOSIS — C785 Secondary malignant neoplasm of large intestine and rectum: Secondary | ICD-10-CM | POA: Insufficient documentation

## 2018-10-15 DIAGNOSIS — C7802 Secondary malignant neoplasm of left lung: Secondary | ICD-10-CM

## 2018-10-15 DIAGNOSIS — Z79899 Other long term (current) drug therapy: Secondary | ICD-10-CM | POA: Diagnosis not present

## 2018-10-15 DIAGNOSIS — C3411 Malignant neoplasm of upper lobe, right bronchus or lung: Secondary | ICD-10-CM

## 2018-10-15 DIAGNOSIS — Z7189 Other specified counseling: Secondary | ICD-10-CM | POA: Insufficient documentation

## 2018-10-15 DIAGNOSIS — R4781 Slurred speech: Secondary | ICD-10-CM | POA: Diagnosis not present

## 2018-10-15 LAB — COMPREHENSIVE METABOLIC PANEL
ALT: 15 U/L (ref 0–44)
AST: 13 U/L — ABNORMAL LOW (ref 15–41)
Albumin: 3.9 g/dL (ref 3.5–5.0)
Alkaline Phosphatase: 95 U/L (ref 38–126)
Anion gap: 13 (ref 5–15)
BUN: 19 mg/dL (ref 8–23)
CO2: 24 mmol/L (ref 22–32)
Calcium: 9 mg/dL (ref 8.9–10.3)
Chloride: 103 mmol/L (ref 98–111)
Creatinine, Ser: 1.36 mg/dL — ABNORMAL HIGH (ref 0.61–1.24)
GFR calc Af Amer: 59 mL/min — ABNORMAL LOW (ref >60)
GFR calc non Af Amer: 51 mL/min — ABNORMAL LOW (ref >60)
Glucose, Bld: 217 mg/dL — ABNORMAL HIGH (ref 70–99)
Potassium: 3.6 mmol/L (ref 3.5–5.1)
Sodium: 140 mmol/L (ref 135–145)
Total Bilirubin: 0.4 mg/dL (ref 0.3–1.2)
Total Protein: 7.1 g/dL (ref 6.5–8.1)

## 2018-10-15 LAB — CBC WITH DIFFERENTIAL/PLATELET
Abs Immature Granulocytes: 0.04 10*3/uL (ref 0.00–0.07)
Basophils Absolute: 0.1 10*3/uL (ref 0.0–0.1)
Basophils Relative: 1 %
Eosinophils Absolute: 0.4 10*3/uL (ref 0.0–0.5)
Eosinophils Relative: 4 %
HCT: 46.7 % (ref 39.0–52.0)
Hemoglobin: 15.1 g/dL (ref 13.0–17.0)
Immature Granulocytes: 0 %
Lymphocytes Relative: 33 %
Lymphs Abs: 3.5 10*3/uL (ref 0.7–4.0)
MCH: 27.8 pg (ref 26.0–34.0)
MCHC: 32.3 g/dL (ref 30.0–36.0)
MCV: 85.8 fL (ref 80.0–100.0)
Monocytes Absolute: 0.8 10*3/uL (ref 0.1–1.0)
Monocytes Relative: 8 %
Neutro Abs: 5.7 10*3/uL (ref 1.7–7.7)
Neutrophils Relative %: 54 %
Platelets: 295 10*3/uL (ref 150–400)
RBC: 5.44 MIL/uL (ref 4.22–5.81)
RDW: 13.4 % (ref 11.5–15.5)
WBC: 10.5 10*3/uL (ref 4.0–10.5)
nRBC: 0 % (ref 0.0–0.2)

## 2018-10-15 LAB — MAGNESIUM: Magnesium: 2.1 mg/dL (ref 1.7–2.4)

## 2018-10-15 MED ORDER — CYANOCOBALAMIN 1000 MCG/ML IJ SOLN
1000.0000 ug | Freq: Once | INTRAMUSCULAR | Status: AC
  Administered 2018-10-15: 10:00:00 1000 ug via INTRAMUSCULAR

## 2018-10-15 MED ORDER — CYANOCOBALAMIN 1000 MCG/ML IJ SOLN
INTRAMUSCULAR | Status: AC
  Filled 2018-10-15: qty 1

## 2018-10-15 MED ORDER — FOLIC ACID 1 MG PO TABS: 1.0000 mg | ORAL_TABLET | Freq: Every day | ORAL | 3 refills | Status: DC

## 2018-10-15 MED ORDER — PROCHLORPERAZINE MALEATE 10 MG PO TABS: 10.0000 mg | ORAL_TABLET | Freq: Four times a day (QID) | ORAL | 1 refills | Status: AC | PRN

## 2018-10-15 NOTE — Progress Notes (Signed)
ON PATHWAY REGIMEN - Non-Small Cell Lung  No Change  Continue With Treatment as Ordered.     A cycle is 21 days:     Pembrolizumab        Dose Mod: None  **Always confirm dose/schedule in your pharmacy ordering system**  Patient Characteristics: Stage IV Metastatic, Non Squamous, Initial Chemotherapy/Immunotherapy, PS = 0, 1, PD-L1 Expression Positive  ? 50% (TPS) AJCC T Category: T3 Current Disease Status: Distant Metastases AJCC N Category: N2 AJCC M Category: M1b AJCC 8 Stage Grouping: IVA Histology: Non Squamous Cell ROS1 Rearrangement Status: Negative T790M Mutation Status: Not Applicable - EGFR Mutation Negative/Unknown Other Mutations/Biomarkers: No Other Actionable Mutations PD-L1 Expression Status: PD-L1 Positive ? 50% (TPS) Chemotherapy/Immunotherapy LOT: Initial Chemotherapy/Immunotherapy Molecular Targeted Therapy: Not Appropriate ALK Translocation Status: Negative EGFR Mutation Status: Negative/Wild Type BRAF [id number] Mutation Status: Negative Performance Status: PS = 0, 1 Intent of Therapy: Non-Curative / Palliative Intent, Discussed with Patient

## 2018-10-15 NOTE — Progress Notes (Signed)
Pt presents today for treatment and office visit with Dr. Delton Coombes. Labs reviewed and within parameters for treatment. VSS.   Per [facility] LPN. No treatment today. Give pt his Vit. b12 prior to discharge. Tx plan to change.   Darrell Christensen presents today for injection per MD orders. B12  administered IM in left deltoid. Administration without incident. Patient tolerated well.

## 2018-10-15 NOTE — Progress Notes (Signed)
Darrell Christensen, [place] [address]   CLINIC:  Medical Oncology/Hematology  PCP:  Celene Squibb, MD [place] [address] [phone]   REASON FOR VISIT:  Follow-up for stage IV adenocarcinoma of the right lung   BRIEF ONCOLOGIC HISTORY:  Oncology History  Primary cancer of right upper lobe of lung ([place])  05/14/2015 Procedure   Port placement by Dr. Arnoldo Morale   04/01/2016 Initial Diagnosis   Primary cancer of right upper lobe of lung ([place])   04/02/2016 Procedure   Ultrasound-guided core biopsy performed of a solid 1.5 cm soft tissue nodule in the right chest wall.   04/03/2016 Imaging   MRI brain- No acute and intracranial process or metastasis.  Moderate chronic small vessel ischemic disease.   04/04/2016 Pathology Results   Diagnosis Soft Tissue Needle Core Biopsy, Right Chest Wall SMOOTH MUSCLE NEOPLASM Microscopic Comment The differential diagnosis include low grade leiomyosarcoma. The neoplasm shows spindle cell morphology with rare mitotic figures and minimal atypia, no necrosis present. These cells stains positive for smooth muscle actin, desmin, negative for cytokeratin AE1&3, ck8/18, s100, cd117, cd 34 and cd99. This case also reviewed by Dr. [name] and agree.   04/05/2016 Procedure   CT-guided biopsy of right upper lobe lesion, with tissue specimen sent to pathology, by IR.   04/09/2016 Pathology Results   Lung, needle/core biopsy(ies), Right Upper Lobe - NON SMALL CELL CARCINOMA.   04/17/2016 Pathology Results   PDL1 High Expression.  TPS 50%.   04/24/2016 PET scan   1. Prominently hypermetabolic large right upper lobe mass with metastatic disease to the right paratracheal and right hilar nodal chains, to the descending mesocolon, to the left upper buttock subcutaneous tissues, to the left upper lobe, to the right subscapularis muscle, and possibly to the left adrenal gland and right breast  subcutaneous tissues. Appearance compatible with stage IV lung cancer. 2. There is some focal high activity in the ascending colon which is probably from peristalsis, less likely from a local colon mass. This does raise the possibility that the adjacent mesocolon tumor implant could be due to synchronous colon cancer rather than lung metastasis. 3. Other imaging findings of potential clinical significance: Coronary, aortic arch, and branch vessel atherosclerotic vascular disease. Aortoiliac atherosclerotic vascular disease. Deformity left proximal humerus and left glenohumeral joint from prior trauma. Enlarged prostate gland.   04/26/2016 Pathology Results   FoundationONE: Genomic alterations identified- KRAS X83A, CREBBP splice site [id number], LRP1B S515*, AYO45 T97*, FSF42 splice site [id number], SPTA1 splice site [id number], TP53 C135Y.  Additional findings- MS-Stable, TMB-intermediate (14 Muts/Mb).  No reportable alterations- EGFR, ALK, BRAF, MET, ERBB2, RET, ROS1.   05/17/2016 -  Chemotherapy   The patient had ONDANSETRON IVPB [facility] +/- DEXAMETHASONE, , Intravenous,  Once, 0 of 1 cycle  pembrolizumab (KEYTRUDA) 200 mg in sodium chloride 0.9 % 50 mL chemo infusion, 200 mg, Intravenous, Once, 0 of 5 cycles  for chemotherapy treatment.     08/06/2016 PET scan   IMPRESSION: 1. Overall considerable improvement. The right upper lobe mass is markedly reduced in volume and SUV. Thoracic adenopathy have resolved and the hypermetabolic lesion in the right subscapularis muscle has also resolved. 2. The left upper lobe nodule is no longer appreciably hypermetabolic, and is highly indistinct although this may be due to motion artifact. Faint in indistinct nodularity elsewhere in the lungs likewise not currently hypermetabolic. 3. There is some very faint residual low-grade activity along the subcutaneous deposit  along the upper left buttock region. Marked reduction in size and activity of  the tumor deposit adjacent to the descending colon. 4. Prior right pleural effusion has resolved. 5. Stable appearance of the adrenal glands, with low-level activity and a nodule in the left adrenal gland. 6. Stable small nodule in the right breast, low-grade metabolic activity with maximum SUV 2.2 (formerly 3.0) 7. Other imaging findings of potential clinical significance: Severe arthropathy of the left glenohumeral joint. Coronary, aortic arch, and branch vessel atherosclerotic vascular disease. Aortoiliac atherosclerotic vascular disease. Prominent prostate gland indents the bladder base.    10/16/2018 -  Chemotherapy   The patient had palonosetron (ALOXI) injection 0.25 mg, 0.25 mg, Intravenous,  Once, 0 of 4 cycles PEMEtrexed (ALIMTA) 1,125 mg in sodium chloride 0.9 % 100 mL chemo infusion, 500 mg/m2, Intravenous,  Once, 0 of 6 cycles CARBOplatin (PARAPLATIN) in sodium chloride 0.9 % 100 mL chemo infusion, , Intravenous,  Once, 0 of 4 cycles pembrolizumab (KEYTRUDA) 200 mg in sodium chloride 0.9 % 50 mL chemo infusion, 200 mg, Intravenous, Once, 0 of 6 cycles fosaprepitant (EMEND) 150 mg, dexamethasone (DECADRON) 12 mg in sodium chloride 0.9 % 145 mL IVPB, , Intravenous,  Once, 0 of 4 cycles  for chemotherapy treatment.    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  Two adjacent large masslike areas of consolidation within the right upper lobe with differential considerations including malignancy or pneumonia. Multiple enlarged right hilar and mediastinal lymph nodes which may be reactive or metastatic in etiology.  Additional indeterminate pulmonary nodules as above. Recommend attention on follow-up.  Indeterminate 2.9 cm left adrenal nodule. This needs dedicated evaluation with pre and post contrast-enhanced CT or MRI after confirmation of  pulmonary process.  Indeterminate nodule within the right breast. Recommend dedicated evaluation with mammography.  Hepatic steatosis.   04/02/2016 Procedure   Ultrasound-guided core biopsy performed of a solid 1.5 cm soft tissue nodule in the right chest wall.   04/04/2016 Pathology Results   Diagnosis Soft Tissue Needle Core Biopsy, Right Chest Wall SMOOTH MUSCLE NEOPLASM Microscopic Comment The differential diagnosis include low grade leiomyosarcoma. The neoplasm shows spindle cell morphology with rare mitotic figures and minimal atypia, no necrosis present. These cells stains positive for smooth muscle actin, desmin, negative for cytokeratin AE1&3, ck8/18, s100, cd117, cd 34 and cd99. This case also reviewed by Dr. [name] and agree.       CANCER STAGING: Cancer Staging Primary cancer of right upper lobe of lung ([place]) Staging form: Lung, AJCC 8th Edition - Clinical: Stage IVB (cT3, cN2, cM1c) - Signed by Baird Cancer, PA-C on 05/07/2016    INTERVAL HISTORY:  Mr. Buresh 73 y.o. male returns for follow-up of his lung cancer.  His appetite and energy levels are low.  Denies any diarrhea or dry cough.  No new pains were reported.  He continues to have some leg swellings.  Denies any fevers or infections.  Denies any hospitalizations.  He was seen by Dr. Julien Nordmann for second opinion.  Denies any bleeding per rectum or melena.     REVIEW OF SYSTEMS:  Review of Systems  Respiratory: Positive for shortness of breath.   Cardiovascular: Positive for leg swelling.  Skin: Positive for itching.     PAST MEDICAL/SURGICAL HISTORY:  Past Medical History:  Diagnosis Date  . Adrenal mass, left ([place]) 04/01/2016  . Diabetes mellitus ([place])  05/02/2016  . Diabetes mellitus without complication ([place])   . Hypertension   . Mass of breast, right 04/01/2016  . Mass of upper lobe of right lung 04/01/2016   2 adjacent, 5 cm masses, hilar adenopathy  . Primary cancer of right upper  lobe of lung ([place]) 04/01/2016   2 adjacent, 5 cm masses, hilar adenopathy  . Pulmonary embolus ([place]) 03/31/2016  . 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 COLONOSCOPY N/A 07/28/2017   Procedure: COLONOSCOPY;  Surgeon: Rogene Houston, MD;  Location: [facility] ENDO SUITE;  Service: Endoscopy;  Laterality: N/A;  205  . PORTACATH PLACEMENT Left 05/13/2016   Procedure: INSERTION PORT-A-CATH;  Surgeon: Aviva Signs, MD;  Location: [facility] ORS;  Service: General;  Laterality: Left;     SOCIAL HISTORY:  Social History   Socioeconomic History  . Marital status: Divorced    Spouse name: Not on file  . Number of children: Not on file  . Years of education: Not on file  . Highest education level: Not on file  Occupational History  . Not on file  Social Needs  . Financial resource strain: Not on file  . Food insecurity    Worry: Not on file    Inability: Not on file  . Transportation needs    Medical: Not on file    Non-medical: Not on file  Tobacco Use  . Smoking status: Current Every Day Smoker    Years: 61.00    Types: E-cigarettes    Last attempt to quit: 04/11/2014    Years since quitting: 4.5  . Smokeless tobacco: Never Used  . Tobacco comment: patient does vape smoking x 2 years  Substance and Sexual Activity  . Alcohol use: No    Comment: quit 10 years  . Drug use: No  . Sexual activity: Not on file  Lifestyle  . Physical activity    Days per week: Not on file    Minutes per session: Not on file  . Stress: Not on file  Relationships  . Social Herbalist on phone: Not on file    Gets together: Not on file    Attends religious service: Not on file    Active member of club or organization: Not on file    Attends meetings of clubs or organizations: Not on file    Relationship status: Not on file  . Intimate partner violence    Fear of current or ex partner: Not on file    Emotionally abused: Not on file    Physically abused:  Not on file    Forced sexual activity: Not on file  Other Topics Concern  . Not on file  Social History Narrative  . Not on file    FAMILY HISTORY:  Family History  Problem Relation Age of Onset  . Stroke Mother   . Heart attack Father   . Heart attack Brother     CURRENT MEDICATIONS:  Outpatient Encounter Medications as of 10/15/2018  Medication Sig  . ALPRAZolam (XANAX) 1 MG tablet Take 1 mg by mouth 3 (three) times daily as needed for anxiety.  Marland Kitchen amLODipine (NORVASC) 2.5 MG tablet Take 2.5 mg by mouth daily.  . diphenoxylate-atropine (LOMOTIL) 2.5-0.025 MG tablet Take 1 tablet 4 (four) times daily as needed by mouth for diarrhea or loose stools.  . furosemide (LASIX) 20 MG tablet Take 2 tablets (40 mg total) by mouth daily.  Marland Kitchen GLIPIZIDE  XL 5 MG 24 hr tablet Take 5 mg by mouth daily.  . hydrocortisone cream 0.5 % Apply 1 application topically 2 (two) times daily.  . hydrOXYzine (ATARAX/VISTARIL) 25 MG tablet Take 1 tablet (25 mg total) by mouth 3 (three) times daily as needed.  . lidocaine-prilocaine (EMLA) cream Apply a quarter size amount to affected area 1 hour prior to coming to chemotherapy. Cover with plastic wrap.  . metFORMIN (GLUCOPHAGE) 500 MG tablet Take 500 mg by mouth 2 (two) times daily.  . metoprolol succinate (TOPROL-XL) 25 MG 24 hr tablet TAKE ONE TABLET BY MOUTH IN THE EVENING.  Marland Kitchen ondansetron (ZOFRAN) 8 MG tablet Take 8 mg by mouth every 8 (eight) hours as needed for nausea or vomiting.  Marland Kitchen oxyCODONE-acetaminophen (PERCOCET) 10-325 MG tablet   . Pembrolizumab (KEYTRUDA IV) Inject into the vein.  . potassium chloride (K-DUR,KLOR-CON) 10 MEQ tablet   . prochlorperazine (COMPAZINE) 10 MG tablet   . simvastatin (ZOCOR) 20 MG tablet Take 20 mg by mouth every evening.  . Skin Protectants, Misc. (EUCERIN) cream Apply topically as needed for dry skin.  Marland Kitchen spironolactone (ALDACTONE) 50 MG tablet Take 1 tablet (50 mg total) by mouth daily.  Marland Kitchen triamcinolone cream (KENALOG)  0.1 % Apply 1 application topically 2 (two) times daily.   No facility-administered encounter medications on file as of 10/15/2018.     ALLERGIES:  No Known Allergies   PHYSICAL EXAM:  ECOG Performance status: 1  Vitals:   10/15/18 0800  BP: (!) 148/90  Pulse: (!) 104  Resp: 18  Temp: (!) 97.5 F (36.4 C)  SpO2: 96%   Filed Weights   10/15/18 0800  Weight: 227 lb 1 oz (103 kg)    Physical Exam Vitals signs reviewed.  Constitutional:      Appearance: Normal appearance.  Cardiovascular:     Rate and Rhythm: Normal rate and regular rhythm.     Heart sounds: Normal heart sounds.  Pulmonary:     Effort: Pulmonary effort is normal.     Breath sounds: Normal breath sounds.  Abdominal:     General: There is no distension.     Palpations: Abdomen is soft. There is no mass.  Musculoskeletal:        General: No swelling.  Skin:    General: Skin is warm.  Neurological:     General: No focal deficit present.     Mental Status: He is alert and oriented to person, place, and time.  Psychiatric:        Mood and Affect: Mood normal.        Behavior: Behavior normal.      LABORATORY DATA:  I have reviewed the labs as listed.  CBC    Component Value Date/Time   WBC 10.5 10/15/2018 0757   RBC 5.44 10/15/2018 0757   HGB 15.1 10/15/2018 0757   HGB 13.5 05/02/2016 0852   HCT 46.7 10/15/2018 0757   HCT 41.0 05/02/2016 0852   PLT 295 10/15/2018 0757   PLT 436 (H) 05/02/2016 0852   MCV 85.8 10/15/2018 0757   MCV 85.7 05/02/2016 0852   MCH 27.8 10/15/2018 0757   MCHC 32.3 10/15/2018 0757   RDW 13.4 10/15/2018 0757   RDW 13.9 05/02/2016 0852   LYMPHSABS 3.5 10/15/2018 0757   LYMPHSABS 3.4 (H) 05/02/2016 0852   MONOABS 0.8 10/15/2018 0757   MONOABS 1.2 (H) 05/02/2016 0852   EOSABS 0.4 10/15/2018 0757   EOSABS 1.6 (H) 05/02/2016 0852   BASOSABS 0.1  10/15/2018 0757   BASOSABS 0.2 (H) 05/02/2016 0852   CMP Latest Ref Rng & Units 10/15/2018 09/24/2018 09/03/2018  Glucose  70 - 99 mg/dL 217(H) 135(H) 230(H)  BUN 8 - 23 mg/dL '19 13 14  '$ Creatinine 0.61 - 1.24 mg/dL 1.36(H) 1.12 1.19  Sodium 135 - 145 mmol/L 140 137 140  Potassium 3.5 - 5.1 mmol/L 3.6 3.9 4.3  Chloride 98 - 111 mmol/L 103 102 104  CO2 22 - 32 mmol/L '24 22 26  '$ Calcium 8.9 - 10.3 mg/dL 9.0 9.0 9.0  Total Protein 6.5 - 8.1 g/dL 7.1 7.3 7.4  Total Bilirubin 0.3 - 1.2 mg/dL 0.4 0.5 0.4  Alkaline Phos 38 - 126 U/L 95 95 106  AST 15 - 41 U/L 13(L) 14(L) 14(L)  ALT 0 - 44 U/L '15 13 13       '$ DIAGNOSTIC IMAGING:  I have independently reviewed the scans and discussed with the patient.     ASSESSMENT & PLAN:   Primary cancer of right upper lobe of lung ([place]) 1.  Stage IV adenocarcinoma of the lung, PDL 1-50%, other actionable mutations negative: -Metastatic disease in the right upper lobe, mediastinal and hilar adenopathy, descending mesocolon, left upper buttock subcutaneous tissue, left upper lobe, right subscapularis muscle, left adrenal gland, right breast subcutaneous tissue - Keytruda started on 05/17/2016,  CT scan on 07/09/2017 showing decrease in size of lung nodules, 2 nodules in the serosa of the sigmoid colon, status post colonoscopy which showed submucosal 12 mm firm lesion in the sigmoid colon, no biopsies done. - He tolerated Keytruda very well without any side effects.  Last dose was on 09/24/2018. -CT scan on 09/21/2018 showed slight progression of the right upper lobe mass.  The progression was more when we compare it with the scans from September 2019.  - He was seen by Dr. Julien Nordmann for second opinion.  He was recommended to have chemoimmunotherapy for 4 cycles with carboplatin and pemetrexed with Keytruda followed by pemetrexed and Keytruda maintenance. - We talked about the chemotherapy regimen and side effects in detail.  We have given B12 injection today in preparation for chemotherapy.  He will also take folic acid daily.  We called in the prescription. -We will arrange  chemotherapy next week.  I plan to repeat scans after 2-3 cycles.  I will also call and talk to his daughter.  Total time spent is 40 minutes with more than 50% of the time spent discussing scan results, therapy plan and coordination of care.    Orders placed this encounter:  No orders of the defined types were placed in this encounter.     Derek Jack, MD [place] [phone]

## 2018-10-15 NOTE — Patient Instructions (Signed)
Pemetrexed ([place])  About This Drug Pemetrexed is used to treat cancer. It is given in the vein (IV)  Possible Side Effects . Tiredness . Decreased appetite (decreased hunger) . Nausea  Note: Each of the side effects above was reported in 20% or greater of patients treated with pemetrexed. Not all possible side effects are included above. You may have different side effects if you are receiving premetrexed in combination with other chemotherapy agents.  Warnings and Precautions . Bone marrow suppression. This is a decrease in the number of white blood cells, red blood cells, and platelets. This may raise your risk of infection, make you tired and weak (fatigue), and raise your risk of bleeding. . Changes in your kidney function, which can be life-threatening . Severe allergic skin reaction, which can be life-threatening. You may develop a rash with fluid-filled bumps/blisters, and/or a red skin rash which sometimes can be weeping (peeling off). . Inflammation (swelling) of the lungs. You may have a dry cough or trouble breathing. . If you have received radiation treatments, your skin may become red after receving pemetrexed. This reaction is called "radiation recall." Your body is recalling, or remembering, that it had radiation therapy.  Note: Some of the side effects above are very rare. If you have concerns and/or questions, please discuss them with your medical team.  Important Information . This drug may be present in the saliva, tears, sweat, urine, stool, vomit, semen, and vaginal secretions. Talk to your doctor and/or your nurse about the necessary precautions to take during this time.  Treating Side Effects . Manage tiredness by pacing your activities for the day. . Be sure to include periods of rest between energy-draining activities. . To decrease the risk of infection, wash your hands regularly. . Avoid close contact with people who have a cold, the flu, or other  infections. . Take your temperature as your doctor or nurse tells you, and whenever you feel like you may have a fever. . To help decrease the risk of bleeding, use a soft toothbrush. Check with your nurse before using dental floss. . Be very careful when using knives or tools. . Use an electric shaver instead of a razor. . Drink plenty of fluids (a minimum of eight glasses per day is recommended). . To help with nausea, eat small, frequent meals instead of three large meals a day. Choose foods and drinks that are at room temperature. Ask your nurse or doctor about other helpful tips and medicine that is available to help stop or lessen these symptoms. . To help with decreased appetite, eat small, frequent meals. Eat foods high in calories and protein, such as meat, poultry, fish, dry beans, tofu, eggs, nuts, milk, yogurt, cheese, ice cream, pudding, and nutritional supplements. . Consider using sauces and spices to increase taste. Daily exercise, with your doctor's approval, may increase your appetite. . Avoid sun exposure and apply sunscreen routinely when outdoors. . If you get a rash do not put anything on it unless your doctor or nurse says you may. Keep the area around the rash clean and dry. Ask your doctor for medicine if your rash bothers you. . If you received radiation, and your skin becomes red or irritated again, follow the same care instructions you did during radiation treatment. Be sure to tell the nurse or doctor administering your chemotherapy about your skin changes.  Food and Drug Interactions . There are no known interactions of pemetrexed with food. . Check with your doctor or  pharmacist about all other prescription medicines and over-the-counter medicines and dietary supplements (vitamins, minerals, herbs and others) you are taking before starting this medicine as there are known drug interactions with pemetrexed. Also, check with your doctor or pharmacist before  starting any new prescription or over-the-counter medicines, or dietary supplements to make sure that there are no interactions. . There are known interactions of pemetrexed with other medicines and products like aspirin, ibuprofen. Ask your doctor what over-the-counter (OTC) medicines you can take for fever, headache and muscle and joint pain.  When to Call the Doctor Call your doctor or nurse if you have any of these symptoms and/or any new or unusual symptoms: . Fever of 100.4 F (38 C) or higher . Chills . Tiredness that interferes with your daily activities. . Feeling dizzy or lightheaded . Easy bleeding or bruising . Pain in your chest . Dry cough . Trouble breathing . Decreased or very dark urine . A new rash or a rash that is not relieved by prescribed medicines . Flu-like symptoms: fever, headache, muscle and joint aches, and fatigue (low energy, feeling weak) . Nausea that stops you from eating or drinking and/or is not relieved by prescribed medicines . Lasting loss of appetite or rapid weight loss of five pounds in a week . If you think you may be pregnant or may have impregnated your partner  Reproduction Warnings . Pregnancy warning: This drug can have harmful effects on the unborn baby. Women of childbearing potential should use effective methods of birth control during your cancer treatment and for 6 months after treatment. Males with male partners of childbearing potential should use effective contraception during treatment and for 3 months after treatment. Let your doctor know right away if you think you may be pregnant or may have impregnated your partner. . Breastfeeding warning: It is not known if this drug passes into breast milk. Women should not breastfeed during treatment and for 1 week after treatment because this drug could enter the breast milk and cause harm to a breastfeeding baby. . Fertility warning: In men this drug may affect your ability to have  children in the future. Talk with your doctor or nurse if you plan to have children. Ask for information on sperm banking.  Medications you will receive in the clinic prior to your chemotherapy medications:  Aloxi:  ALOXI is used in adults to help prevent nausea and vomiting that happens with certain anti-cancer medicines (chemotherapy).  Aloxi is a long acting medication, and will remain in your system for 24-36 hours.   Dexamethasone:  This is a steroid given prior to chemotherapy to help prevent allergic reactions; it may also help prevent and control nausea and diarrhea.    Carboplatin (Paraplatin, CBDCA)  About This Drug Carboplatin is used to treat cancer. It is given in the vein through your port a cath.  It will take 30 minutes to infuse. You will receive this medication on Days 1-4 (Monday-Thursday) of each cycle.    Possible Side Effects . Bone marrow suppression. This is a decrease in the number of white blood cells, red blood cells, and platelets. This may raise your risk of infection, make you tired and weak (fatigue), and raise your risk of bleeding. . Nausea and vomiting (throwing up) . Weakness . Changes in your liver function . Changes in your kidney function . Electrolyte changes . Pain  Note: Each of the side effects above was reported in 20% or greater of patients treated with  carboplatin. Not all possible side effects are included above.  Warnings and Precautions . Severe bone marrow suppression . Allergic reactions, including anaphylaxis are rare but may happen in some patients. Signs of allergic reaction to this drug may be swelling of the face, feeling like your tongue or throat are swelling, trouble breathing, rash, itching, fever, chills, feeling dizzy, and/or feeling that your heart is beating in a fast or not normal way. If this happens, do not take another dose of this drug. You should get urgent medical treatment. . Severe nausea and vomiting .  Effects on the nerves are called peripheral neuropathy. This risk is increased if you are over the age of 84 or if you have received other medicine with risk of peripheral neuropathy. You may feel numbness, tingling, or pain in your hands and feet. It may be hard for you to button your clothes, open jars, or walk as usual. The effect on the nerves may get worse with more doses of the drug. These effects get better in some people after the drug is stopped but it does not get better in all people. Marland Kitchen Blurred vision, loss of vision or other changes in eyesight . Decreased hearing . Skin and tissue irritation including redness, pain, warmth, or swelling at the IV site if the drug leaks out of the vein and into nearby tissue. . Severe changes in your kidney function, which can cause kidney failure . Severe changes in your liver function, which can cause liver failure  Note: Some of the side effects above are very rare. If you have concerns and/or questions, please discuss them with your medical team.  Important Information . This drug may be present in the saliva, tears, sweat, urine, stool, vomit, semen, and vaginal secretions. Talk to your doctor and/or your nurse about the necessary precautions to take during this time.  Treating Side Effects . Manage tiredness by pacing your activities for the day. . Be sure to include periods of rest between energy-draining activities. . To decrease the risk of infection, wash your hands regularly. . Avoid close contact with people who have a cold, the flu, or other infections. . Take your temperature as your doctor or nurse tells you, and whenever you feel like you may have a fever. . To help decrease the risk of bleeding, use a soft toothbrush. Check with your nurse before using dental floss. . Be very careful when using knives or tools. . Use an electric shaver instead of a razor. . Drink plenty of fluids (a minimum of eight glasses per day is  recommended). . If you throw up or have loose bowel movements, you should drink more fluids so that you do not become dehydrated (lack of water in the body from losing too much fluid). . To help with nausea and vomiting, eat small, frequent meals instead of three large meals a day. Choose foods and drinks that are at room temperature. Ask your nurse or doctor about other helpful tips and medicine that is available to help stop or lessen these symptoms. . If you have numbness and tingling in your hands and feet, be careful when cooking, walking, and handling sharp objects and hot liquids. Marland Kitchen Keeping your pain under control is important to your well-being. Please tell your doctor or nurse if you are experiencing pain.  Food and Drug Interactions . There are no known interactions of carboplatin with food. . This drug may interact with other medicines. Tell your doctor and pharmacist about  all the prescription and over-the-counter medicines and dietary supplements (vitamins, minerals, herbs and others) that you are taking at this time. Also, check with your doctor or pharmacist before starting any new prescription or over-the-counter medicines, or dietary supplements to make sure that there are no interactions.  When to Call the Doctor Call your doctor or nurse if you have any of these symptoms and/or any new or unusual symptoms: . Fever of 100.4 F (38 C) or higher . Chills . Tiredness that interferes with your daily activities . Feeling dizzy or lightheaded . Easy bleeding or bruising . Nausea that stops you from eating or drinking and/or is not relieved by prescribed medicines . Throwing up more than 3 times a day . Blurred vision or other changes in eyesight . Decrease in hearing or ringing in the ear . Signs of allergic reaction: swelling of the face, feeling like your tongue or throat are swelling, trouble breathing, rash, itching, fever, chills, feeling dizzy, and/or feeling that  your heart is beating in a fast or not normal way. If this happens, call 911 for emergency care. . While you are getting this drug, please tell your nurse right away if you have any pain, redness, or swelling at the site of the IV infusion . Signs of possible liver problems: dark urine, pale bowel movements, bad stomach pain, feeling very tired and weak, unusual itching, or yellowing of the eyes or skin . Decreased urine, or very dark urine . Numbness, tingling, or pain in your hands and feet . Pain that does not go away or is not relieved by prescribed medicine . If you think you may be pregnant  Reproduction Warnings . Pregnancy warning: This drug may have harmful effects on the unborn baby. Women of child bearing potential should use effective methods of birth control during your cancer treatment. Let your doctor know right away if you think you may be pregnant. . Breastfeeding warning: It is not known if this drug passes into breast milk. For this reason, women should not breastfeed during treatment because this drug could enter the breast milk and cause harm to a breastfeeding baby. . Fertility warning: Human fertility studies have not been done with this drug. Talk with your doctor or nurse if you plan to have children. Ask for information on sperm or egg banking.  SELF CARE ACTIVITIES WHILE ON CHEMOTHERAPY:  Hydration Increase your fluid intake 48 hours prior to treatment and drink at least 8 to 12 cups (64 ounces) of water/decaffeinated beverages per day after treatment. You can still have your cup of coffee or soda but these beverages do not count as part of your 8 to 12 cups that you need to drink daily. No alcohol intake.  Medications Continue taking your normal prescription medication as prescribed.  If you start any new herbal or new supplements please let us know first to make sure it is safe.  Mouth Care Have teeth cleaned professionally before starting treatment. Keep  dentures and partial plates clean. Use soft toothbrush and do not use mouthwashes that contain alcohol. Biotene is a good mouthwash that is available at most pharmacies or may be ordered by calling [phone]. Use warm salt water gargles (1 teaspoon salt per 1 quart warm water) before and after meals and at bedtime. Or you may rinse with 2 tablespoons of three-percent hydrogen peroxide mixed in eight ounces of water. If you are still having problems with your mouth or sores in your mouth please call  the clinic. If you need dental work, please let the doctor know before you go for your appointment so that we can coordinate the best possible time for you in regards to your chemo regimen. You need to also let your dentist know that you are actively taking chemo. We may need to do labs prior to your dental appointment.  Skin Care Always use sunscreen that has not expired and with SPF (Sun Protection Factor) of 50 or higher. Wear hats to protect your head from the sun. Remember to use sunscreen on your hands, ears, face, & feet.  Use good moisturizing lotions such as udder cream, eucerin, or even Vaseline. Some chemotherapies can cause dry skin, color changes in your skin and nails.    . Avoid long, hot showers or baths. . Use gentle, fragrance-free soaps and laundry detergent. . Use moisturizers, preferably creams or ointments rather than lotions because the thicker consistency is better at preventing skin dehydration. Apply the cream or ointment within 15 minutes of showering. Reapply moisturizer at night, and moisturize your hands every time after you wash them.  Hair Loss (if your doctor says your hair will fall out)  . If your doctor says that your hair is likely to fall out, decide before you begin chemo whether you want to wear a wig. You may want to shop before treatment to match your hair color. . Hats, turbans, and scarves can also camouflage hair loss, although some people prefer to leave  their heads uncovered. If you go bare-headed outdoors, be sure to use sunscreen on your scalp. . Cut your hair short. It eases the inconvenience of shedding lots of hair, but it also can reduce the emotional impact of watching your hair fall out. . Don't perm or color your hair during chemotherapy. Those chemical treatments are already damaging to hair and can enhance hair loss. Once your chemo treatments are done and your hair has grown back, it's OK to resume dyeing or perming hair.  With chemotherapy, hair loss is almost always temporary. But when it grows back, it may be a different color or texture. In older adults who still had hair color before chemotherapy, the new growth may be completely gray.  Often, new hair is very fine and soft.  Infection Prevention Please wash your hands for at least 30 seconds using warm soapy water. Handwashing is the #1 way to prevent the spread of germs. Stay away from sick people or people who are getting over a cold. If you develop respiratory systems such as green/yellow mucus production or productive cough or persistent cough let us know and we will see if you need an antibiotic. It is a good idea to keep a pair of gloves on when going into grocery stores/[employer] to decrease your risk of coming into contact with germs on the carts, etc. Carry alcohol hand gel with you at all times and use it frequently if out in public. If your temperature reaches 100.5 or higher please call the clinic and let us know.  If it is after hours or on the weekend please go to the ER if your temperature is over 100.5.  Please have your own personal thermometer at home to use.    Sex and bodily fluids If you are going to have sex, a condom must be used to protect the person that isn't taking chemotherapy. Chemo can decrease your libido (sex drive). For a few days after chemotherapy, chemotherapy can be excreted through your bodily fluids.  When using the toilet please close the lid and  flush the toilet twice.  Do this for a few day after you have had chemotherapy.   Effects of chemotherapy on your sex life Some changes are simple and won't last long. They won't affect your sex life permanently.  Sometimes you may feel: . too tired . not strong enough to be very active . sick or sore  . not in the mood . anxious or low Your anxiety might not seem related to sex. For example, you may be worried about the cancer and how your treatment is going. Or you may be worried about money, or about how you family are coping with your illness. These things can cause stress, which can affect your interest in sex. It's important to talk to your partner about how you feel. Remember - the changes to your sex life don't usually last long. There's usually no medical reason to stop having sex during chemo. The drugs won't have any long term physical effects on your performance or enjoyment of sex. Cancer can't be passed on to your partner during sex  Contraception It's important to use reliable contraception during treatment. Avoid getting pregnant while you or your partner are having chemotherapy. This is because the drugs may harm the baby. Sometimes chemotherapy drugs can leave a man or woman infertile.  This means you would not be able to have children in the future. You might want to talk to someone about permanent infertility. It can be very difficult to learn that you may no longer be able to have children. Some people find counselling helpful. There might be ways to preserve your fertility, although this is easier for men than for women. You may want to speak to a fertility expert. You can talk about sperm banking or harvesting your eggs. You can also ask about other fertility options, such as donor eggs. If you have or have had breast cancer, your doctor might advise you not to take the contraceptive pill. This is because the hormones in it might affect the cancer.  It is not known for sure  whether or not chemotherapy drugs can be passed on through semen or secretions from the vagina. Because of this some doctors advise people to use a barrier method if you have sex during treatment. This applies to vaginal, anal or oral sex. Generally, doctors advise a barrier method only for the time you are actually having the treatment and for about a week after your treatment. Advice like this can be worrying, but this does not mean that you have to avoid being intimate with your partner. You can still have close contact with your partner and continue to enjoy sex.  Animals If you have cats or birds we just ask that you not change the litter or change the cage.  Please have someone else do this for you while you are on chemotherapy.   Food Safety During and After Cancer Treatment Food safety is important for people both during and after cancer treatment. Cancer and cancer treatments, such as chemotherapy, radiation therapy, and stem cell/bone marrow transplantation, often weaken the immune system. This makes it harder for your body to protect itself from foodborne illness, also called food poisoning. Foodborne illness is caused by eating food that contains harmful bacteria, parasites, or viruses.  Foods to avoid Some foods have a higher risk of becoming tainted with bacteria. These include: Marland Kitchen Unwashed fresh fruit and vegetables, especially leafy vegetables that can hide  dirt and other contaminants . Raw sprouts, such as alfalfa sprouts . Raw or undercooked beef, especially ground beef, or other raw or undercooked meat and poultry . Fatty, fried, or spicy foods immediately before or after treatment.  These can sit heavy on your stomach and make you feel nauseous. . Raw or undercooked shellfish, such as oysters. . Sushi and sashimi, which often contain raw fish.  . Unpasteurized beverages, such as unpasteurized fruit juices, raw milk, raw yogurt, or cider . Undercooked eggs, such as soft boiled,  over easy, and poached; raw, unpasteurized eggs; or foods made with raw egg, such as homemade raw cookie dough and homemade mayonnaise  Simple steps for food safety  Shop smart. . Do not buy food stored or displayed in an unclean area. . Do not buy bruised or damaged fruits or vegetables. . Do not buy cans that have cracks, dents, or bulges. . Pick up foods that can spoil at the end of your shopping trip and store them in a cooler on the way home.  Prepare and clean up foods carefully. . Rinse all fresh fruits and vegetables under running water, and dry them with a clean towel or paper towel. . Clean the top of cans before opening them. . After preparing food, wash your hands for 20 seconds with hot water and soap. Pay special attention to areas between fingers and under nails. . Clean your utensils and dishes with hot water and soap. Marland Kitchen Disinfect your kitchen and cutting boards using 1 teaspoon of liquid, unscented bleach mixed into 1 quart of water.    Dispose of old food. . Eat canned and packaged food before its expiration date (the "use by" or "best before" date). . Consume refrigerated leftovers within 3 to 4 days. After that time, throw out the food. Even if the food does not smell or look spoiled, it still may be unsafe. Some bacteria, such as Listeria, can grow even on foods stored in the refrigerator if they are kept for too long.  Take precautions when eating out. . At restaurants, avoid buffets and salad bars where food sits out for a long time and comes in contact with many people. Food can become contaminated when someone with a virus, often a norovirus, or another "bug" handles it. . Put any leftover food in a "to-go" container yourself, rather than having the server do it. And, refrigerate leftovers as soon as you get home. . Choose restaurants that are clean and that are willing to prepare your food as you order it cooked.   MEDICATIONS:  Compazine/Prochlorperazine 10mg  tablet. Take 1 tablet every 6 hours as needed for nausea/vomiting. (This can make you sleepy)   EMLA cream. Apply a quarter size amount to port site 1 hour prior to chemo. Do not rub in. Cover with plastic wrap.   Over-the-Counter Meds:  Colace - 100 mg capsules - take 2 capsules daily.  If this doesn't help then you can increase to 2 capsules twice daily.  Call us if this does not help your bowels move.   Imodium 2mg  capsule. Take 2 capsules after the 1st loose stool and then 1 capsule every 2 hours until you go a total of 12 hours without having a loose stool. Call the [facility] if loose stools continue. If diarrhea occurs at bedtime, take 2 capsules at bedtime. Then take 2 capsules every 4 hours until morning. Call [facility].    Diarrhea Sheet   If you are having loose stools/diarrhea, please purchase Imodium and begin taking as outlined:  At the first sign of poorly formed or loose stools you should begin taking Imodium (loperamide) 2 mg capsules.  Take two tablets (4mg ) followed by one tablet (2mg ) every 2 hours - DO NOT EXCEED 8 tablets in 24 hours.  If it is bedtime and you are having loose stools, take 2 tablets at bedtime, then 2 tablets every 4 hours until morning.   Always call the [facility] if you are having loose stools/diarrhea that you can't get under control.  Loose stools/diarrhea leads to dehydration (loss of water) in your body.  We have other options of trying to get the loose stools/diarrhea to stop but you must let us know!   Constipation Sheet  Colace - 100 mg capsules - take 2 capsules daily.  If this doesn't help then you can increase to 2 capsules twice daily.  Please call if the above does not work for you.   Do not go more than 2 days without a bowel movement.  It is very important that you do  not become constipated.  It will make you feel sick to your stomach (nausea) and can cause abdominal pain and vomiting.   Nausea Sheet   Compazine/Prochlorperazine 10mg  tablet. Take 1 tablet every 6 hours as needed for nausea/vomiting. (This can make you sleepy)  If you are having persistent nausea (nausea that does not stop) please call the [facility] and let us know the amount of nausea that you are experiencing.  If you begin to vomit, you need to call the [facility] and if it is the weekend and you have vomited more than one time and can't get it to stop-go to the Emergency Room.  Persistent nausea/vomiting can lead to dehydration (loss of fluid in your body) and will make you feel terrible.   Ice chips, sips of clear liquids, foods that are @ room temperature, crackers, and toast tend to be better tolerated.   SYMPTOMS TO REPORT AS SOON AS POSSIBLE AFTER TREATMENT:   FEVER GREATER THAN 100.5 F  CHILLS WITH OR WITHOUT FEVER  NAUSEA AND VOMITING THAT IS NOT CONTROLLED WITH YOUR NAUSEA MEDICATION  UNUSUAL SHORTNESS OF BREATH  UNUSUAL BRUISING OR BLEEDING  TENDERNESS IN MOUTH AND THROAT WITH OR WITHOUT PRESENCE OF ULCERS  URINARY PROBLEMS  BOWEL PROBLEMS  UNUSUAL RASH      Wear comfortable clothing and clothing appropriate for easy access to any Portacath or PICC line. Let us know if there is anything that we can do to make your therapy  better!    What to do if you need assistance after hours or on the weekends: CALL [phone].  HOLD on the line, do not hang up.  You will hear multiple messages but at the end you will be connected with a nurse triage line.  They will contact the doctor if necessary.  Most of the time they will be able to assist you.  Do not call the hospital operator.      I have been informed and understand all of the instructions given to me and have received a copy. I have been instructed to call the clinic [phone] or my family  physician as soon as possible for continued medical care, if indicated. I do not have any more questions at this time but understand that I may call the [facility] or the Patient Navigator at [phone] during office hours should I have questions or need assistance in obtaining follow-up care.

## 2018-10-15 NOTE — Assessment & Plan Note (Addendum)
1.  Stage IV adenocarcinoma of the lung, PDL 1-50%, other actionable mutations negative: -Metastatic disease in the right upper lobe, mediastinal and hilar adenopathy, descending mesocolon, left upper buttock subcutaneous tissue, left upper lobe, right subscapularis muscle, left adrenal gland, right breast subcutaneous tissue - Keytruda started on 05/17/2016,  CT scan on 07/09/2017 showing decrease in size of lung nodules, 2 nodules in the serosa of the sigmoid colon, status post colonoscopy which showed submucosal 12 mm firm lesion in the sigmoid colon, no biopsies done. - He tolerated Keytruda very well without any side effects.  Last dose was on 09/24/2018. -CT scan on 09/21/2018 showed slight progression of the right upper lobe mass.  The progression was more when we compare it with the scans from September 2019.  - He was seen by Dr. Julien Nordmann for second opinion.  He was recommended to have chemoimmunotherapy for 4 cycles with carboplatin and pemetrexed with Keytruda followed by pemetrexed and Keytruda maintenance. - We talked about the chemotherapy regimen and side effects in detail.  We have given B12 injection today in preparation for chemotherapy.  He will also take folic acid daily.  We called in the prescription. -We will arrange chemotherapy next week.  I plan to repeat scans after 2-3 cycles.  I will also call and talk to his daughter.

## 2018-10-15 NOTE — Progress Notes (Signed)
DISCONTINUE ON PATHWAY REGIMEN - Non-Small Cell Lung     A cycle is 21 days:     Pembrolizumab        Dose Mod: None  **Always confirm dose/schedule in your pharmacy ordering system**  REASON: Other Reason PRIOR TREATMENT: [id number]: Pembrolizumab 200 mg q21 Days Until Disease Progression, Unacceptable Toxicity, or up to 24 Months TREATMENT RESPONSE: Unable to Evaluate  START OFF PATHWAY REGIMEN - Non-Small Cell Lung   [id number]:Pembrolizumab 200 mg  IV D1 + Pemetrexed 500 mg/m2 IV D1 + Carboplatin AUC=5 IV D1 q21 Days:   A cycle is every 21 days:     Pembrolizumab      Pemetrexed      Carboplatin   **Always confirm dose/schedule in your pharmacy ordering system**  Patient Characteristics: Stage IV Metastatic, Nonsquamous, Initial Chemotherapy/Immunotherapy, PS = 0, 1, ALK or EGFR or ROS1 or NTRK Genomic Alterations - Did Not Order Test/Quantity Not Sufficient AJCC T Category: T3 Current Disease Status: Distant Metastases AJCC N Category: N2 AJCC M Category: M1b AJCC 8 Stage Grouping: IVA Histology: Nonsquamous Cell ROS1 Rearrangement Status: Negative T790M Mutation Status: Not Applicable - EGFR Mutation Negative/Unknown Other Mutations/Biomarkers: No Other Actionable Mutations NTRK Gene Fusion Status: Did Not Order Test PD-L1 Expression Status: PD-L1 Positive ? 50% (TPS) Chemotherapy/Immunotherapy LOT: Initial Chemotherapy/Immunotherapy Molecular Targeted Therapy: Not Appropriate ALK Rearrangement Status: Negative EGFR Mutation Status: Negative/Wild Type BRAF V600E Mutation Status: Negative ECOG Performance Status: 1 Intent of Therapy: Non-Curative / Palliative Intent, Discussed with Patient

## 2018-10-15 NOTE — Patient Instructions (Addendum)
[facility] at [facility] Discharge Instructions  You were seen today by Dr. Katragadda. He went over your recent lab results. He will see you back in 1 week for labs, treatment and follow up.   Thank you for choosing [facility] at [facility] to provide your oncology and hematology care.  To afford each patient quality time with our provider, please arrive at least 15 minutes before your scheduled appointment time.   If you have a lab appointment with the Cancer Center please come in thru the  Main Entrance and check in at the main information desk  You need to re-schedule your appointment should you arrive 10 or more minutes late.  We strive to give you quality time with our providers, and arriving late affects you and other patients whose appointments are after yours.  Also, if you no show three or more times for appointments you may be dismissed from the clinic at the providers discretion.     Again, thank you for choosing [facility].  Our hope is that these requests will decrease the amount of time that you wait before being seen by our physicians.       _____________________________________________________________  Should you have questions after your visit to [facility], please contact our office at [phone] between the hours of 8:00 a.m. and 4:30 p.m.  Voicemails left after 4:00 p.m. will not be returned until the following business day.  For prescription refill requests, have your pharmacy contact our office and allow 72 hours.    Cancer Center Support Programs:   > Cancer Support Group  2nd Tuesday of the month 1pm-2pm, Journey Room    

## 2018-10-16 ENCOUNTER — Telehealth (HOSPITAL_COMMUNITY): Payer: Self-pay | Admitting: Hematology

## 2018-10-16 NOTE — Telephone Encounter (Signed)
Pc for [facility] Rn stating pts new chemo request will have to be reviewed by an MD because the new regimen is consider 1st line and pt had progression on Keytruda as a single agent so this would be considered a 2nd line tx for him.

## 2018-10-19 ENCOUNTER — Encounter (HOSPITAL_COMMUNITY): Payer: Self-pay | Admitting: *Deleted

## 2018-10-19 ENCOUNTER — Other Ambulatory Visit (HOSPITAL_COMMUNITY): Payer: Self-pay | Admitting: *Deleted

## 2018-10-19 ENCOUNTER — Other Ambulatory Visit: Payer: Self-pay

## 2018-10-19 DIAGNOSIS — Z5112 Encounter for antineoplastic immunotherapy: Secondary | ICD-10-CM

## 2018-10-19 DIAGNOSIS — C3411 Malignant neoplasm of upper lobe, right bronchus or lung: Secondary | ICD-10-CM

## 2018-10-19 DIAGNOSIS — C499 Malignant neoplasm of connective and soft tissue, unspecified: Secondary | ICD-10-CM

## 2018-10-19 NOTE — Progress Notes (Signed)
Patient's daughter returned my call.  Patient experienced slurred speech and weakness for about 30 minutes while away from home.  He "got better" and drove himself home.  Patient refused to go to the ER at that time.  She states that today he is somewhat better but wants to see the doctor.  I offered an appointment today with our NP and it was refused, they only want to see the physician.  I have made an appointment for both labs and doctor visit for tomorrow.  I did advise [name] that if her father has any signs of slurred speech, disorientation, weakness, altered mental status to take him directly to the ER as these are signs of potential stroke.  She verbalizes understanding.

## 2018-10-19 NOTE — Progress Notes (Signed)
I received the following email from patient's daughter.  Good Morning , Dad had a fail yesterday but I was unable to get him to go to the ER, I did talk with a nurse on the phone from [facility] ,its seems he was on a stool for a undetermined amount of time when he got up and his legs would not hold him ,he said his whole leg went to sleep, and someone cought him before he hit his head, he said it took 20 min. uso for him to be able to walk on it but he drove home and called me. I came up and started making phone calls because he would not go to the ER, not sure what to do at this point?  I was unable to get patient on the phone.  He did not have a VM to leave a message. I also attempted to call his daughter and was unable to leave a message there as well.    I responded to his daughter's email and asked that she call the clinic as soon as possible.

## 2018-10-20 ENCOUNTER — Inpatient Hospital Stay (HOSPITAL_COMMUNITY): Payer: Medicare Other

## 2018-10-20 ENCOUNTER — Encounter (HOSPITAL_COMMUNITY): Payer: Self-pay | Admitting: Hematology

## 2018-10-20 ENCOUNTER — Inpatient Hospital Stay (HOSPITAL_COMMUNITY): Payer: Medicare Other | Admitting: Hematology

## 2018-10-20 VITALS — BP 160/96 | HR 75 | Temp 97.8°F | Resp 16 | Wt 227.4 lb

## 2018-10-20 DIAGNOSIS — C785 Secondary malignant neoplasm of large intestine and rectum: Secondary | ICD-10-CM | POA: Diagnosis not present

## 2018-10-20 DIAGNOSIS — C499 Malignant neoplasm of connective and soft tissue, unspecified: Secondary | ICD-10-CM

## 2018-10-20 DIAGNOSIS — C7802 Secondary malignant neoplasm of left lung: Secondary | ICD-10-CM

## 2018-10-20 DIAGNOSIS — C3411 Malignant neoplasm of upper lobe, right bronchus or lung: Secondary | ICD-10-CM

## 2018-10-20 DIAGNOSIS — R4781 Slurred speech: Secondary | ICD-10-CM | POA: Diagnosis not present

## 2018-10-20 DIAGNOSIS — Z79899 Other long term (current) drug therapy: Secondary | ICD-10-CM

## 2018-10-20 DIAGNOSIS — Z5112 Encounter for antineoplastic immunotherapy: Secondary | ICD-10-CM

## 2018-10-20 LAB — CBC WITH DIFFERENTIAL/PLATELET
Abs Immature Granulocytes: 0.03 10*3/uL (ref 0.00–0.07)
Basophils Absolute: 0.1 10*3/uL (ref 0.0–0.1)
Basophils Relative: 1 %
Eosinophils Absolute: 0.5 10*3/uL (ref 0.0–0.5)
Eosinophils Relative: 5 %
HCT: 46.3 % (ref 39.0–52.0)
Hemoglobin: 15.1 g/dL (ref 13.0–17.0)
Immature Granulocytes: 0 %
Lymphocytes Relative: 40 %
Lymphs Abs: 4.1 10*3/uL — ABNORMAL HIGH (ref 0.7–4.0)
MCH: 28.2 pg (ref 26.0–34.0)
MCHC: 32.6 g/dL (ref 30.0–36.0)
MCV: 86.4 fL (ref 80.0–100.0)
Monocytes Absolute: 0.9 10*3/uL (ref 0.1–1.0)
Monocytes Relative: 9 %
Neutro Abs: 4.8 10*3/uL (ref 1.7–7.7)
Neutrophils Relative %: 45 %
Platelets: 306 10*3/uL (ref 150–400)
RBC: 5.36 MIL/uL (ref 4.22–5.81)
RDW: 13.5 % (ref 11.5–15.5)
WBC: 10.4 10*3/uL (ref 4.0–10.5)
nRBC: 0 % (ref 0.0–0.2)

## 2018-10-20 LAB — COMPREHENSIVE METABOLIC PANEL
ALT: 17 U/L (ref 0–44)
AST: 15 U/L (ref 15–41)
Albumin: 3.9 g/dL (ref 3.5–5.0)
Alkaline Phosphatase: 94 U/L (ref 38–126)
Anion gap: 13 (ref 5–15)
BUN: 15 mg/dL (ref 8–23)
CO2: 24 mmol/L (ref 22–32)
Calcium: 8.8 mg/dL — ABNORMAL LOW (ref 8.9–10.3)
Chloride: 103 mmol/L (ref 98–111)
Creatinine, Ser: 1.36 mg/dL — ABNORMAL HIGH (ref 0.61–1.24)
GFR calc Af Amer: 59 mL/min — ABNORMAL LOW (ref >60)
GFR calc non Af Amer: 51 mL/min — ABNORMAL LOW (ref >60)
Glucose, Bld: 80 mg/dL (ref 70–99)
Potassium: 3.6 mmol/L (ref 3.5–5.1)
Sodium: 140 mmol/L (ref 135–145)
Total Bilirubin: 0.3 mg/dL (ref 0.3–1.2)
Total Protein: 7.1 g/dL (ref 6.5–8.1)

## 2018-10-20 LAB — MAGNESIUM: Magnesium: 1.9 mg/dL (ref 1.7–2.4)

## 2018-10-20 LAB — TSH: TSH: 2.02 u[IU]/mL (ref 0.350–4.500)

## 2018-10-20 NOTE — Assessment & Plan Note (Signed)
1.  Slurred speech: - Patient reportedly sat on metal stool for more than 30 minutes.  He felt that his left leg was weak when he got up and he fell down as result.  No major injuries.  He also felt his left leg was numb which recovered in 30 minutes. -After he got home, when his daughter called him, he had slurred speech lasting about couple of hours. -Today physical examination revealed no focal neurological deficits.  He is at his baseline. -His last MRI of the brain was done in 2017.  I plan to repeat an MRI of the brain with and without gadolinium. -He is very anxious to have the MRI.  We will premedicate him with Ativan 1 mg IV prior to his MRI.  2.  Stage IV adenocarcinoma of the lung, PDL 150%, other actionable mutations negative: - Diagnosed with metastatic disease in the right upper lobe, mediastinal and hilar adenopathy, descending mesocolon, left upper buttock soft tissue, left upper lobe, right subscapularis muscle, left adrenal gland and right breast subcutaneous tissue. - Keytruda from 05/17/2016 through 09/24/2018 with progression. -CT scan on 09/21/2018 showed slight progression in the right upper lobe.  Progression was more when we compared it with scans from September 2019. - He was seen by Dr. Julien Nordmann for second opinion.  He was recommended to have chemoimmunotherapy for 4 cycles with carboplatin and pemetrexed with Keytruda followed by pemetrexed and Keytruda maintenance. - He did receive B12 injection and folic acid prescription last Thursday in preparation for his chemotherapy.  He will come back this Friday for his cycle 1 of therapy. -His daughter was present with him today.  I answered all his and her questions.

## 2018-10-20 NOTE — Progress Notes (Signed)
[place] [place], [place] [address]   CLINIC:  Medical Oncology/Hematology  PCP:  Celene Squibb, MD [place] [address] [phone]   REASON FOR VISIT:  Follow-up for stage IV adenocarcinoma of the right lung   BRIEF ONCOLOGIC HISTORY:  Oncology History  Primary cancer of right upper lobe of lung ([place])  05/14/2015 Procedure   Port placement by Dr. Arnoldo Morale   04/01/2016 Initial Diagnosis   Primary cancer of right upper lobe of lung ([place])   04/02/2016 Procedure   Ultrasound-guided core biopsy performed of a solid 1.5 cm soft tissue nodule in the right chest wall.   04/03/2016 Imaging   MRI brain- No acute and intracranial process or metastasis.  Moderate chronic small vessel ischemic disease.   04/04/2016 Pathology Results   Diagnosis Soft Tissue Needle Core Biopsy, Right Chest Wall SMOOTH MUSCLE NEOPLASM Microscopic Comment The differential diagnosis include low grade leiomyosarcoma. The neoplasm shows spindle cell morphology with rare mitotic figures and minimal atypia, no necrosis present. These cells stains positive for smooth muscle actin, desmin, negative for cytokeratin AE1&3, ck8/18, s100, cd117, cd 34 and cd99. This case also reviewed by Dr. [name] and agree.   04/05/2016 Procedure   CT-guided biopsy of right upper lobe lesion, with tissue specimen sent to pathology, by IR.   04/09/2016 Pathology Results   Lung, needle/core biopsy(ies), Right Upper Lobe - NON SMALL CELL CARCINOMA.   04/17/2016 Pathology Results   PDL1 High Expression.  TPS 50%.   04/24/2016 PET scan   1. Prominently hypermetabolic large right upper lobe mass with metastatic disease to the right paratracheal and right hilar nodal chains, to the descending mesocolon, to the left upper buttock subcutaneous tissues, to the left upper lobe, to the right subscapularis muscle, and possibly to the left adrenal gland and right breast  subcutaneous tissues. Appearance compatible with stage IV lung cancer. 2. There is some focal high activity in the ascending colon which is probably from peristalsis, less likely from a local colon mass. This does raise the possibility that the adjacent mesocolon tumor implant could be due to synchronous colon cancer rather than lung metastasis. 3. Other imaging findings of potential clinical significance: Coronary, aortic arch, and branch vessel atherosclerotic vascular disease. Aortoiliac atherosclerotic vascular disease. Deformity left proximal humerus and left glenohumeral joint from prior trauma. Enlarged prostate gland.   04/26/2016 Pathology Results   FoundationONE: Genomic alterations identified- KRAS X83A, CREBBP splice site [id number], LRP1B S515*, AYO45 T97*, FSF42 splice site [id number], SPTA1 splice site [id number], TP53 C135Y.  Additional findings- MS-Stable, TMB-intermediate (14 Muts/Mb).  No reportable alterations- EGFR, ALK, BRAF, MET, ERBB2, RET, ROS1.   05/17/2016 -  Chemotherapy   The patient had ONDANSETRON IVPB [facility] +/- DEXAMETHASONE, , Intravenous,  Once, 0 of 1 cycle  pembrolizumab (KEYTRUDA) 200 mg in sodium chloride 0.9 % 50 mL chemo infusion, 200 mg, Intravenous, Once, 0 of 5 cycles  for chemotherapy treatment.     08/06/2016 PET scan   IMPRESSION: 1. Overall considerable improvement. The right upper lobe mass is markedly reduced in volume and SUV. Thoracic adenopathy have resolved and the hypermetabolic lesion in the right subscapularis muscle has also resolved. 2. The left upper lobe nodule is no longer appreciably hypermetabolic, and is highly indistinct although this may be due to motion artifact. Faint in indistinct nodularity elsewhere in the lungs likewise not currently hypermetabolic. 3. There is some very faint residual low-grade activity along the subcutaneous deposit  along the upper left buttock region. Marked reduction in size and activity of  the tumor deposit adjacent to the descending colon. 4. Prior right pleural effusion has resolved. 5. Stable appearance of the adrenal glands, with low-level activity and a nodule in the left adrenal gland. 6. Stable small nodule in the right breast, low-grade metabolic activity with maximum SUV 2.2 (formerly 3.0) 7. Other imaging findings of potential clinical significance: Severe arthropathy of the left glenohumeral joint. Coronary, aortic arch, and branch vessel atherosclerotic vascular disease. Aortoiliac atherosclerotic vascular disease. Prominent prostate gland indents the bladder base.    10/23/2018 -  Chemotherapy   The patient had palonosetron (ALOXI) injection 0.25 mg, 0.25 mg, Intravenous,  Once, 0 of 4 cycles PEMEtrexed (ALIMTA) 1,125 mg in sodium chloride 0.9 % 100 mL chemo infusion, 500 mg/m2, Intravenous,  Once, 0 of 6 cycles CARBOplatin (PARAPLATIN) in sodium chloride 0.9 % 100 mL chemo infusion, , Intravenous,  Once, 0 of 4 cycles pembrolizumab (KEYTRUDA) 200 mg in sodium chloride 0.9 % 50 mL chemo infusion, 200 mg, Intravenous, Once, 0 of 6 cycles fosaprepitant (EMEND) 150 mg, dexamethasone (DECADRON) 12 mg in sodium chloride 0.9 % 145 mL IVPB, , Intravenous,  Once, 0 of 4 cycles  for chemotherapy treatment.    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  Two adjacent large masslike areas of consolidation within the right upper lobe with differential considerations including malignancy or pneumonia. Multiple enlarged right hilar and mediastinal lymph nodes which may be reactive or metastatic in etiology.  Additional indeterminate pulmonary nodules as above. Recommend attention on follow-up.  Indeterminate 2.9 cm left adrenal nodule. This needs dedicated evaluation with pre and post contrast-enhanced CT or MRI after confirmation of  pulmonary process.  Indeterminate nodule within the right breast. Recommend dedicated evaluation with mammography.  Hepatic steatosis.   04/02/2016 Procedure   Ultrasound-guided core biopsy performed of a solid 1.5 cm soft tissue nodule in the right chest wall.   04/04/2016 Pathology Results   Diagnosis Soft Tissue Needle Core Biopsy, Right Chest Wall SMOOTH MUSCLE NEOPLASM Microscopic Comment The differential diagnosis include low grade leiomyosarcoma. The neoplasm shows spindle cell morphology with rare mitotic figures and minimal atypia, no necrosis present. These cells stains positive for smooth muscle actin, desmin, negative for cytokeratin AE1&3, ck8/18, s100, cd117, cd 34 and cd99. This case also reviewed by Dr. [name] and agree.       CANCER STAGING: Cancer Staging Primary cancer of right upper lobe of lung ([place]) Staging form: Lung, AJCC 8th Edition - Clinical: Stage IVB (cT3, cN2, cM1c) - Signed by Baird Cancer, PA-C on 05/07/2016    INTERVAL HISTORY:  Mr. Engelbert 73 y.o. male returns for follow-up of his lung cancer.  He had an episode when he is left leg gave out and he fell after sitting on a stool for 30 minutes.  He also had slurred speech lasting about 2 hours after that episode.  Today he denies any focal neurological deficits.  No headaches were reported.  No focal weakness.  No further slurring of speech.  He has occasional diarrhea and abdominal pain.  Numbness in the legs has been stable.  Energy levels are very low.    REVIEW OF SYSTEMS:  Review of Systems  Respiratory: Negative for shortness of breath.   Cardiovascular: Negative for leg swelling.  Gastrointestinal: Positive for abdominal pain and constipation.  Skin: Negative for itching.  Neurological: Positive for numbness.  All other systems reviewed and are negative.    PAST MEDICAL/SURGICAL HISTORY:  Past Medical History:  Diagnosis Date  . Adrenal mass, left ([place]) 04/01/2016  .  Diabetes mellitus ([place]) 05/02/2016  . Diabetes mellitus without complication ([place])   . Hypertension   . Mass of breast, right 04/01/2016  . Mass of upper lobe of right lung 04/01/2016   2 adjacent, 5 cm masses, hilar adenopathy  . Primary cancer of right upper lobe of lung ([place]) 04/01/2016   2 adjacent, 5 cm masses, hilar adenopathy  . Pulmonary embolus ([place]) 03/31/2016  . 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 COLONOSCOPY N/A 07/28/2017   Procedure: COLONOSCOPY;  Surgeon: Rogene Houston, MD;  Location: [facility] ENDO SUITE;  Service: Endoscopy;  Laterality: N/A;  205  . PORTACATH PLACEMENT Left 05/13/2016   Procedure: INSERTION PORT-A-CATH;  Surgeon: Aviva Signs, MD;  Location: [facility] ORS;  Service: General;  Laterality: Left;     SOCIAL HISTORY:  Social History   Socioeconomic History  . Marital status: Divorced    Spouse name: Not on file  . Number of children: Not on file  . Years of education: Not on file  . Highest education level: Not on file  Occupational History  . Not on file  Social Needs  . Financial resource strain: Not on file  . Food insecurity    Worry: Not on file    Inability: Not on file  . Transportation needs    Medical: Not on file    Non-medical: Not on file  Tobacco Use  . Smoking status: Current Every Day Smoker    Years: 61.00    Types: E-cigarettes    Last attempt to quit: 04/11/2014    Years since quitting: 4.5  . Smokeless tobacco: Never Used  . Tobacco comment: patient does vape smoking x 2 years  Substance and Sexual Activity  . Alcohol use: No    Comment: quit 10 years  . Drug use: No  . Sexual activity: Not on file  Lifestyle  . Physical activity    Days per week: Not on file    Minutes per session: Not on file  . Stress: Not on file  Relationships  . Social Herbalist on phone: Not on file    Gets together: Not on file    Attends religious service: Not on file    Active member of  club or organization: Not on file    Attends meetings of clubs or organizations: Not on file    Relationship status: Not on file  . Intimate partner violence    Fear of current or ex partner: Not on file    Emotionally abused: Not on file    Physically abused: Not on file    Forced sexual activity: Not on file  Other Topics Concern  . Not on file  Social History Narrative  . Not on file    FAMILY HISTORY:  Family History  Problem Relation Age of Onset  . Stroke Mother   . Heart attack Father   . Heart attack Brother     CURRENT MEDICATIONS:  Outpatient Encounter Medications as of 10/20/2018  Medication Sig  . ALPRAZolam (XANAX) 1 MG tablet Take 1 mg by mouth 3 (three) times daily as needed for anxiety.  Marland Kitchen amLODipine (NORVASC) 2.5 MG tablet Take 2.5 mg by mouth daily.  . [  START ON 10/23/2018] CARBOPLATIN IV Inject into the vein every 21 ( twenty-one) days.  . folic acid (FOLVITE) 1 MG tablet Take 1 tablet (1 mg total) by mouth daily. Start 5-7 days before Alimta chemotherapy. Continue until 21 days after Alimta completed.  . furosemide (LASIX) 20 MG tablet Take 2 tablets (40 mg total) by mouth daily.  Marland Kitchen GLIPIZIDE XL 5 MG 24 hr tablet Take 5 mg by mouth daily.  . hydrocortisone cream 0.5 % Apply 1 application topically 2 (two) times daily.  . hydrOXYzine (ATARAX/VISTARIL) 25 MG tablet Take 1 tablet (25 mg total) by mouth 3 (three) times daily as needed.  . lidocaine-prilocaine (EMLA) cream Apply a quarter size amount to affected area 1 hour prior to coming to chemotherapy. Cover with plastic wrap.  . metFORMIN (GLUCOPHAGE) 500 MG tablet Take 500 mg by mouth 2 (two) times daily.  . metoprolol succinate (TOPROL-XL) 25 MG 24 hr tablet TAKE ONE TABLET BY MOUTH IN THE EVENING.  Marland Kitchen ondansetron (ZOFRAN) 8 MG tablet Take 8 mg by mouth every 8 (eight) hours as needed for nausea or vomiting.  Marland Kitchen oxyCODONE-acetaminophen (PERCOCET) 10-325 MG tablet   . [START ON 10/23/2018] PEMEtrexed 500 mg/m2 in  sodium chloride 0.9 % 100 mL Inject into the vein every 21 ( twenty-one) days.  . potassium chloride (K-DUR,KLOR-CON) 10 MEQ tablet   . prochlorperazine (COMPAZINE) 10 MG tablet Take 1 tablet (10 mg total) by mouth every 6 (six) hours as needed (Nausea or vomiting).  . simvastatin (ZOCOR) 20 MG tablet Take 20 mg by mouth every evening.  . Skin Protectants, Misc. (EUCERIN) cream Apply topically as needed for dry skin.  Marland Kitchen spironolactone (ALDACTONE) 50 MG tablet Take 1 tablet (50 mg total) by mouth daily.  Marland Kitchen triamcinolone cream (KENALOG) 0.1 % Apply 1 application topically 2 (two) times daily.  . Pembrolizumab (KEYTRUDA IV) Inject into the vein.  . [DISCONTINUED] diphenoxylate-atropine (LOMOTIL) 2.5-0.025 MG tablet Take 1 tablet 4 (four) times daily as needed by mouth for diarrhea or loose stools. (Patient not taking: Reported on 10/20/2018)   No facility-administered encounter medications on file as of 10/20/2018.     ALLERGIES:  No Known Allergies   PHYSICAL EXAM:  ECOG Performance status: 1  Vitals:   10/20/18 1500  BP: (!) 160/96  Pulse: 75  Resp: 16  Temp: 97.8 F (36.6 C)  SpO2: 98%   Filed Weights   10/20/18 1500  Weight: 227 lb 6 oz (103.1 kg)    Physical Exam Vitals signs reviewed.  Constitutional:      Appearance: Normal appearance.  Cardiovascular:     Rate and Rhythm: Normal rate and regular rhythm.     Heart sounds: Normal heart sounds.  Pulmonary:     Effort: Pulmonary effort is normal.     Breath sounds: Normal breath sounds.  Abdominal:     General: There is no distension.     Palpations: Abdomen is soft. There is no mass.  Musculoskeletal:        General: No swelling.  Skin:    General: Skin is warm.  Neurological:     General: No focal deficit present.     Mental Status: He is alert and oriented to person, place, and time.  Psychiatric:        Mood and Affect: Mood normal.        Behavior: Behavior normal.    Muscle power is 4 out of 5 in all  extremities.  LABORATORY DATA:  I have  reviewed the labs as listed.  CBC    Component Value Date/Time   WBC 10.4 10/20/2018 1509   RBC 5.36 10/20/2018 1509   HGB 15.1 10/20/2018 1509   HGB 13.5 05/02/2016 0852   HCT 46.3 10/20/2018 1509   HCT 41.0 05/02/2016 0852   PLT 306 10/20/2018 1509   PLT 436 (H) 05/02/2016 0852   MCV 86.4 10/20/2018 1509   MCV 85.7 05/02/2016 0852   MCH 28.2 10/20/2018 1509   MCHC 32.6 10/20/2018 1509   RDW 13.5 10/20/2018 1509   RDW 13.9 05/02/2016 0852   LYMPHSABS 4.1 (H) 10/20/2018 1509   LYMPHSABS 3.4 (H) 05/02/2016 0852   MONOABS 0.9 10/20/2018 1509   MONOABS 1.2 (H) 05/02/2016 0852   EOSABS 0.5 10/20/2018 1509   EOSABS 1.6 (H) 05/02/2016 0852   BASOSABS 0.1 10/20/2018 1509   BASOSABS 0.2 (H) 05/02/2016 0852   CMP Latest Ref Rng & Units 10/20/2018 10/15/2018 09/24/2018  Glucose 70 - 99 mg/dL 80 217(H) 135(H)  BUN 8 - 23 mg/dL '15 19 13  '$ Creatinine 0.61 - 1.24 mg/dL 1.36(H) 1.36(H) 1.12  Sodium 135 - 145 mmol/L 140 140 137  Potassium 3.5 - 5.1 mmol/L 3.6 3.6 3.9  Chloride 98 - 111 mmol/L 103 103 102  CO2 22 - 32 mmol/L '24 24 22  '$ Calcium 8.9 - 10.3 mg/dL 8.8(L) 9.0 9.0  Total Protein 6.5 - 8.1 g/dL 7.1 7.1 7.3  Total Bilirubin 0.3 - 1.2 mg/dL 0.3 0.4 0.5  Alkaline Phos 38 - 126 U/L 94 95 95  AST 15 - 41 U/L 15 13(L) 14(L)  ALT 0 - 44 U/L '17 15 13       '$ DIAGNOSTIC IMAGING:  I have independently reviewed the scans and discussed with the patient.     ASSESSMENT & PLAN:   Primary cancer of right upper lobe of lung ([place]) 1.  Slurred speech: - Patient reportedly sat on metal stool for more than 30 minutes.  He felt that his left leg was weak when he got up and he fell down as result.  No major injuries.  He also felt his left leg was numb which recovered in 30 minutes. -After he got home, when his daughter called him, he had slurred speech lasting about couple of hours. -Today physical examination revealed no focal neurological deficits.   He is at his baseline. -His last MRI of the brain was done in 2017.  I plan to repeat an MRI of the brain with and without gadolinium. -He is very anxious to have the MRI.  We will premedicate him with Ativan 1 mg IV prior to his MRI.  2.  Stage IV adenocarcinoma of the lung, PDL 150%, other actionable mutations negative: - Diagnosed with metastatic disease in the right upper lobe, mediastinal and hilar adenopathy, descending mesocolon, left upper buttock soft tissue, left upper lobe, right subscapularis muscle, left adrenal gland and right breast subcutaneous tissue. - Keytruda from 05/17/2016 through 09/24/2018 with progression. -CT scan on 09/21/2018 showed slight progression in the right upper lobe.  Progression was more when we compared it with scans from September 2019. - He was seen by Dr. Julien Nordmann for second opinion.  He was recommended to have chemoimmunotherapy for 4 cycles with carboplatin and pemetrexed with Keytruda followed by pemetrexed and Keytruda maintenance. - He did receive B12 injection and folic acid prescription last Thursday in preparation for his chemotherapy.  He will come back this Friday for his cycle 1 of therapy. -His daughter was  present with him today.  I answered all his and her questions.  Total time spent is 40 minutes with more than 50% of the time spent discussing scan results, therapy plan and coordination of care.    Orders placed this encounter:  Orders Placed This Encounter  Procedures  . MR Brain [place], [place] [phone]

## 2018-10-20 NOTE — Patient Instructions (Addendum)
[facility] at [facility] Discharge Instructions  You were seen today by Dr. Delton Coombes. He went over how you've been feeling over the last few days. He will see you back after your scan for  follow up.   Thank you for choosing [place] [facility] [place] [facility] to provide your oncology and hematology care.  To afford each patient quality time with our provider, please arrive at least 15 minutes before your scheduled appointment time.   If you have a lab appointment with the [place] please come in thru the  Main Entrance and check in at the main information desk  You need to re-schedule your appointment should you arrive 10 or more minutes late.  We strive to give you quality time with our providers, and arriving late affects you and other patients whose appointments are after yours.  Also, if you no show three or more times for appointments you may be dismissed from the clinic at the providers discretion.     Again, thank you for choosing [facility].  Our hope is that these requests will decrease the amount of time that you wait before being seen by our physicians.       _____________________________________________________________  Should you have questions after your visit to [facility], please contact our office at [phone] between the hours of 8:00 a.m. and 4:30 p.m.  Voicemails left after 4:00 p.m. will not be returned until the following business day.  For prescription refill requests, have your pharmacy contact our office and allow 72 hours.    Cancer Center Support Programs:   > Cancer Support Group  2nd Tuesday of the month 1pm-2pm, Journey Room

## 2018-10-22 ENCOUNTER — Telehealth (HOSPITAL_COMMUNITY): Payer: Self-pay | Admitting: Hematology

## 2018-10-22 ENCOUNTER — Other Ambulatory Visit (HOSPITAL_COMMUNITY): Payer: Self-pay | Admitting: Hematology

## 2018-10-22 ENCOUNTER — Ambulatory Visit (HOSPITAL_COMMUNITY): Payer: Medicare Other | Admitting: Hematology

## 2018-10-22 ENCOUNTER — Other Ambulatory Visit (HOSPITAL_COMMUNITY): Payer: Medicare Other

## 2018-10-22 NOTE — Telephone Encounter (Signed)
Submitted an urgent appeal for denied chemo drugs [id number].

## 2018-10-23 ENCOUNTER — Ambulatory Visit (HOSPITAL_COMMUNITY): Payer: Medicare Other

## 2018-10-27 ENCOUNTER — Telehealth (HOSPITAL_COMMUNITY): Payer: Self-pay | Admitting: Hematology

## 2018-10-27 ENCOUNTER — Other Ambulatory Visit: Payer: Self-pay

## 2018-10-27 ENCOUNTER — Encounter (HOSPITAL_COMMUNITY): Payer: Self-pay

## 2018-10-27 ENCOUNTER — Ambulatory Visit (HOSPITAL_COMMUNITY)
Admission: RE | Admit: 2018-10-27 | Discharge: 2018-10-27 | Disposition: A | Discharge status: Home / Self Care | Payer: Medicare Other | Source: Ambulatory Visit | Attending: Hematology | Admitting: Hematology

## 2018-10-27 ENCOUNTER — Inpatient Hospital Stay (HOSPITAL_COMMUNITY): Payer: Medicare Other

## 2018-10-27 VITALS — BP 176/86 | HR 88 | Temp 97.8°F | Resp 16

## 2018-10-27 DIAGNOSIS — C3490 Malignant neoplasm of unspecified part of unspecified bronchus or lung: Secondary | ICD-10-CM | POA: Diagnosis not present

## 2018-10-27 DIAGNOSIS — C3411 Malignant neoplasm of upper lobe, right bronchus or lung: Secondary | ICD-10-CM | POA: Diagnosis not present

## 2018-10-27 DIAGNOSIS — C7931 Secondary malignant neoplasm of brain: Secondary | ICD-10-CM | POA: Diagnosis not present

## 2018-10-27 MED ORDER — HEPARIN SOD (PORK) LOCK FLUSH 100 UNIT/ML IV SOLN
500.0000 [IU] | Freq: Once | INTRAVENOUS | Status: AC
  Administered 2018-10-27: 500 [IU] via INTRAVENOUS

## 2018-10-27 MED ORDER — SODIUM CHLORIDE 0.9% FLUSH
10.0000 mL | Freq: Once | INTRAVENOUS | Status: AC
  Administered 2018-10-27: 10 mL via INTRAVENOUS

## 2018-10-27 MED ORDER — LORAZEPAM 2 MG/ML IJ SOLN
1.0000 mg | Freq: Once | INTRAMUSCULAR | Status: AC
  Administered 2018-10-27: 1 mg via INTRAVENOUS

## 2018-10-27 MED ORDER — GADOBUTROL 1 MMOL/ML IV SOLN
10.0000 mL | Freq: Once | INTRAVENOUS | Status: AC | PRN
  Administered 2018-10-27: 10 mL via INTRAVENOUS

## 2018-10-27 MED ORDER — LORAZEPAM 2 MG/ML IJ SOLN
INTRAMUSCULAR | Status: AC
  Filled 2018-10-27: qty 1

## 2018-10-27 NOTE — Progress Notes (Signed)
Patients port flushed without difficulty.  Ativan 1mg  IV verbal order Dr. Delton Coombes for MRI today.  No blood return noted before and after flush. No complaints of pain with flush and no bruising or swelling noted at site.  Band aid applied.  VSS with discharge and carried by wheelchair to radiology department with no s/s of distress noted.

## 2018-10-27 NOTE — Telephone Encounter (Signed)
PC to bc to check the status of the urgent appeal submitted on the 18th. CSR was unable to see if denial had been overturned. She forwarded me to the appeals line where I had to leave another msg requesting status.

## 2018-10-28 ENCOUNTER — Encounter (HOSPITAL_COMMUNITY): Payer: Self-pay | Admitting: *Deleted

## 2018-10-28 ENCOUNTER — Other Ambulatory Visit (HOSPITAL_COMMUNITY): Payer: Self-pay | Admitting: *Deleted

## 2018-10-28 ENCOUNTER — Ambulatory Visit (HOSPITAL_COMMUNITY): Payer: Medicare Other

## 2018-10-28 MED ORDER — DEXAMETHASONE 4 MG PO TABS: 4.0000 mg | ORAL_TABLET | Freq: Four times a day (QID) | ORAL | 0 refills | Status: DC

## 2018-10-28 NOTE — Progress Notes (Signed)
Location/Histology of Brain Tumor: Primary cancer of right upper lobe of lung- Brain mets  Patient presented with symptoms of:  Slurred speech, weakness and numbness in the left leg.  MRI Brain 10/27/2018: Large area of vasogenic edema in the mid right hemisphere is associated with a 22 mm enhancing mass in the posterior right temporal lobe operculum.  Regional mass effect including on the right lateral ventricle but no midline shift.  No other metastatic disease identified.  Past or anticipated interventions, if any, per neurosurgery:   Past or anticipated interventions, if any, per medical oncology:  Dr. Delton Coombes 10/20/2018 Stage IV adenocarcinoma of the lung, PDL 150% -- Diagnosed with metastatic disease in the right upper lobe, mediastinal and hilar adenopathy, descending mesocolon, left upper buttock soft tissue, left upper lobe, right subscapularis muscle, left adrenal gland and right breast subcutaneous tissue. - Keytruda from 05/17/2016 through 09/24/2018 with progression. -CT scan on 09/21/2018 showed slight progression in the right upper lobe.  Progression was more when we compared it with scans from September 2019. - He was seen by Dr. Julien Nordmann for second opinion.  He was recommended to have chemoimmunotherapy for 4 cycles with carboplatin and pemetrexed with Keytruda followed by pemetrexed and Keytruda maintenance. - He did receive B12 injection and folic acid prescription last Thursday in preparation for his chemotherapy.  He will come back this Friday for his cycle 1 of therapy. -His daughter was present with him today.  I answered all his and her questions. -Patient reportedly sat on metal stool for more than 30 minutes.  He felt that his left leg was weak when he got up and he fell down as result.  No major injuries.  He also felt his left leg was numb which recovered in 30 minutes. -After he got home, when his daughter called him, he had slurred speech lasting about couple of  hours. -His last MRI of the brain was done in 2017.  I plan to repeat an MRI of the brain with and without gadolinium.   Dose of Decadron, if applicable: 4 mg q6  Recent neurologic symptoms, if any:   Seizures: No  Headaches: He has had headaches for a while, he does not take medications.  Nausea: No  Dizziness/ataxia:   Difficulty with hand coordination: Yes, fine motor movements and tasks.  Focal numbness/weakness: Bilateral leg weakness, left leg numbness.  Visual deficits/changes: Some bilateral decrease in vision.  Confusion/Memory deficits:     SAFETY ISSUES:  Prior radiation? no  Pacemaker/ICD? No  Possible current pregnancy? N/A  Is the patient on methotrexate? No  Additional Complaints / other details:  -Takes Metformin -Has port

## 2018-10-28 NOTE — Progress Notes (Signed)
MRI report received from [facility] with radiology.  Report given to Dr. Delton Coombes. Orders received for dexamethasone 4 mg 1 tablet by mouth every 6 hours.  Urgent referral to radiation at [facility] for brain lesion.  Referral made and medication sent to pharmacy.  Patient's daughter aware and verbalizes understanding.

## 2018-10-29 ENCOUNTER — Other Ambulatory Visit: Payer: Self-pay | Admitting: Radiation Therapy

## 2018-10-29 ENCOUNTER — Other Ambulatory Visit: Payer: Self-pay | Admitting: Radiation Oncology

## 2018-10-29 ENCOUNTER — Ambulatory Visit
Admission: RE | Admit: 2018-10-29 | Discharge: 2018-10-29 | Disposition: A | Discharge status: Home / Self Care | Payer: Medicare Other | Source: Ambulatory Visit | Attending: Radiation Oncology | Admitting: Radiation Oncology

## 2018-10-29 ENCOUNTER — Encounter: Payer: Self-pay | Admitting: Radiation Oncology

## 2018-10-29 ENCOUNTER — Other Ambulatory Visit: Payer: Self-pay

## 2018-10-29 VITALS — Ht 71.0 in | Wt 227.0 lb

## 2018-10-29 DIAGNOSIS — C3411 Malignant neoplasm of upper lobe, right bronchus or lung: Secondary | ICD-10-CM | POA: Diagnosis not present

## 2018-10-29 DIAGNOSIS — C7931 Secondary malignant neoplasm of brain: Secondary | ICD-10-CM

## 2018-10-29 DIAGNOSIS — C7949 Secondary malignant neoplasm of other parts of nervous system: Secondary | ICD-10-CM

## 2018-10-29 DIAGNOSIS — F17210 Nicotine dependence, cigarettes, uncomplicated: Secondary | ICD-10-CM | POA: Diagnosis not present

## 2018-10-29 MED ORDER — LORAZEPAM 2 MG PO TABS: ORAL_TABLET | ORAL | 0 refills | Status: DC

## 2018-10-29 NOTE — Progress Notes (Signed)
Radiation Oncology         [phone] ________________________________  Name: Darrell Christensen        MRN: [id number]  Date of Service: 10/29/2018 DOB: April 21, 1946  TG:GYIR, Edwinna Areola, MD  Derek Jack, MD     REFERRING PHYSICIAN: Derek Jack, MD   DIAGNOSIS: The encounter diagnosis was Metastasis to brain [facility]).   HISTORY OF PRESENT ILLNESS: Darrell Christensen is a 73 y.o. male seen at the request of Dr. Delton Coombes for history of stage IV non-small cell lung cancer of the right lung.  He was originally diagnosed in November 2017 with stage IV disease.  He presented with disease in the right upper lobe, mediastinum, hilum, adenopathy along the mesocolon, left upper buttock and other sites including the left upper lobe, subscapularis muscle on the right, left adrenal gland and right breast cutaneous tissue.  He received Keytruda between January 2018 and Sep 24, 2018 when he was found to have progressive disease in the right upper lobe.  He was seen by Dr. Julien Nordmann for a second opinion and was encouraged to consider chemoimmunotherapy for 4 cycles with carboplatin and pemetrexed along with his Keytruda.  He was in the midst of getting this authorized, and presented with slurred speech after a fall.  He has been having approximately 3 weeks of headaches as well, and was sent for an MRI of the brain yesterday.  He received 1 mg of IV Ativan through his Port-A-Cath.  His scan revealed a 22 mm brain metastasis in the posterior aspect of the right temporal lobe with significant vasogenic edema in the right hemisphere.  Only mild regional mass-effect was noted without any midline shift.  He is seen today via WebEx platform with his brother and his daughter in attendance to discuss the rationale for radiotherapy.    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 He has smoked for the past 61.00 years. He has never used smokeless tobacco. He reports that he does not drink alcohol or use drugs.   ALLERGIES: Patient has no known allergies.   MEDICATIONS:  Current Outpatient Medications  Medication Sig Dispense Refill   ALPRAZolam (XANAX) 1 MG tablet Take 1 mg by mouth 3 (three) times daily as needed for anxiety.  amLODipine (NORVASC) 2.5 MG tablet Take 2.5 mg by mouth daily.  4   CARBOPLATIN IV Inject into the vein every 21 ( twenty-one) days.     dexamethasone (DECADRON) 4 MG tablet Take 1 tablet (4 mg total) by mouth every 6 (six) hours. 60 tablet 0   folic acid (FOLVITE) 1 MG tablet Take 1 tablet (1 mg total) by mouth daily. Start 5-7 days before Alimta chemotherapy. Continue until 21 days after Alimta completed. 100 tablet 3   furosemide (LASIX) 20 MG tablet Take 2 tablets (40 mg total) by mouth daily. 20 tablet 0   GLIPIZIDE XL 5 MG 24 hr tablet Take 5 mg by mouth daily.  4    hydrocortisone cream 0.5 % Apply 1 application topically 2 (two) times daily. 30 g 0   hydrOXYzine (ATARAX/VISTARIL) 25 MG tablet Take 1 tablet (25 mg total) by mouth 3 (three) times daily as needed. 45 tablet 3   lidocaine-prilocaine (EMLA) cream Apply a quarter size amount to affected area 1 hour prior to coming to chemotherapy. Cover with plastic wrap. 30 g 2   metFORMIN (GLUCOPHAGE) 500 MG tablet Take 500 mg by mouth 2 (two) times daily.  4   metoprolol succinate (TOPROL-XL) 25 MG 24 hr tablet TAKE ONE TABLET BY MOUTH IN THE EVENING. 30 tablet 0   ondansetron (ZOFRAN) 8 MG tablet Take 8 mg by mouth every 8 (eight) hours as needed for nausea or vomiting.     oxyCODONE-acetaminophen (PERCOCET) 10-325 MG tablet   0   Pembrolizumab (KEYTRUDA IV) Inject into the vein.     PEMEtrexed 500 mg/m2 in sodium chloride 0.9 % 100 mL Inject into the vein every 21 ( twenty-one) days.     potassium chloride (K-DUR,KLOR-CON) 10 MEQ tablet   0   prochlorperazine (COMPAZINE) 10 MG tablet Take 1 tablet (10 mg total) by mouth every 6 (six) hours as needed (Nausea or vomiting). 30 tablet 1   simvastatin (ZOCOR) 20 MG tablet Take 20 mg by mouth every evening.  5   Skin Protectants, Misc. (EUCERIN) cream Apply topically as needed for dry skin. 454 g 1   spironolactone (ALDACTONE) 50 MG tablet Take 1 tablet (50 mg total) by mouth daily. 10 tablet 0   triamcinolone cream (KENALOG) 0.1 % Apply 1 application topically 2 (two) times daily.     No current facility-administered medications for this encounter.      REVIEW OF SYSTEMS: On review of systems, the patient reports that he is doing well overall.  The patient's speech is still slurred, he has felt unsteady when walking as well without any additional falls.  Denies any chest pain, shortness of breath, cough, fevers, chills, night sweats, unintended weight changes.  He denies any bowel or bladder disturbances, and denies abdominal pain, nausea or  vomiting.  He denies any new musculoskeletal or joint aches or pains. A complete review of systems is obtained and is otherwise negative.     PHYSICAL EXAM:  Wt Readings from Last 3 Encounters:  10/29/18 227 lb (103 kg)  10/20/18 227 lb 6 oz (103.1 kg)  10/15/18 227 lb 1 oz (103 kg)   Temp Readings from Last 3 Encounters:  10/27/18 97.8 F (36.6 C) (Oral)  10/20/18 97.8 F (36.6 C) (Oral)  10/15/18 (!) 97.5 F (36.4 C) (Oral)   BP Readings from Last 3 Encounters:  10/27/18 (!) 176/86  10/20/18 (!) 160/96  10/15/18 (!) 148/90   Pulse Readings from Last 3 Encounters:  10/27/18  88  10/20/18 75  10/15/18 (!) 104   Pain Assessment Pain Score: 5 (Lower chest with deep breathing, bilateral leg pain, swollen feet.)/10  In general this is a well appearing Caucasian male in no acute distress.  He's alert and oriented x4 and appropriate throughout the examination. Cardiopulmonary assessment is negative for acute distress and he exhibits normal effort.    ECOG = 0  0 - Asymptomatic (Fully active, able to carry on all predisease activities without restriction)  1 - Symptomatic but completely ambulatory (Restricted in physically strenuous activity but ambulatory and able to carry out work of a light or sedentary nature. For example, light housework, office work)  2 - Symptomatic, <50% in bed during the day (Ambulatory and capable of all self care but unable to carry out any work activities. Up and about more than 50% of waking hours)  3 - Symptomatic, >50% in bed, but not bedbound (Capable of only limited self-care, confined to bed or chair 50% or more of waking hours)  4 - Bedbound (Completely disabled. Cannot carry on any self-care. Totally confined to bed or chair)  5 - Death   Eustace Pen MM, Creech RH, Tormey DC, et al. [phone]). "Toxicity and response criteria of the [facility]". [place] Oncol. 5 (6): 649-55    LABORATORY DATA:  Lab Results  Component  Value Date   WBC 10.4 10/20/2018   HGB 15.1 10/20/2018   HCT 46.3 10/20/2018   MCV 86.4 10/20/2018   PLT 306 10/20/2018   Lab Results  Component Value Date   NA 140 10/20/2018   K 3.6 10/20/2018   CL 103 10/20/2018   CO2 24 10/20/2018   Lab Results  Component Value Date   ALT 17 10/20/2018   AST 15 10/20/2018   ALKPHOS 94 10/20/2018   BILITOT 0.3 10/20/2018      RADIOGRAPHY: Mr Jeri Cos QM Contrast  Result Date: 10/28/2018 CLINICAL DATA:  73 year old male with metastatic lung cancer. Staging. EXAM: MRI HEAD WITHOUT AND WITH CONTRAST TECHNIQUE: Multiplanar, multiecho pulse sequences of the brain and surrounding structures were obtained without and with intravenous contrast. CONTRAST:  10 milliliters Gadavist COMPARISON:  Brain MRI 04/03/2016 FINDINGS: Brain: Large area of vasogenic edema in the mid right hemisphere is associated with a 22 millimeter enhancing mass in the posterior right temporal lobe operculum (series 12, image 38). Regional mass effect including on the right lateral ventricle but no midline shift. Postcontrast images are degraded by motion despite repeated imaging attempts. No other intracranial mass or abnormal enhancement is identified. No dural thickening identified. No restricted diffusion to suggest acute infarction. No ventriculomegaly, extra-axial collection or acute intracranial hemorrhage. Cervicomedullary junction and pituitary are within normal limits. Outside of the right hemisphere edema there is scattered chronic nonspecific cerebral white matter T2 and FLAIR hyperintensity which appears stable since 2017. No cortical encephalomalacia or chronic cerebral blood products. Vascular: Major intracranial vascular flow voids are stable. The major dural venous sinuses are enhancing and appear to be patent. Skull and upper cervical spine: Mildly diminished T1 bone marrow signal throughout the visible skull and cervical spine. No destructive osseous lesion identified.  Negative visible spinal cord. Sinuses/Orbits: Stable, negative. Other: Mastoids remain clear. Grossly normal visible internal auditory structures. Scalp and face soft tissues appear negative. IMPRESSION: 1. Positive for a 22 mm brain metastasis in the posterior right temporal lobe with abundant vasogenic edema in the right hemisphere, but only mild regional mass effect and no midline shift.  2. Postcontrast images degraded by motion despite repeated attempts. No other metastatic disease identified. Electronically Signed   By: Genevie Ann M.D.   On: 10/28/2018 10:39       IMPRESSION/PLAN: 1. Progressive Metastatic Stage IV NSCLC, adenocarcinoma of the RUL with new brain metastasis.  Dr. Lisbeth Renshaw discusses the findings on his recent imaging discussed the rationale to consider stereotactic radiosurgery [facility]) to treat the solitary lesion that is noted in the right hemisphere.  The patient does have significant vasogenic edema and was encouraged to continue using dexamethasone at the current dose of 4 mg every 6 hours.  We also discussed the use of over-the-counter PPI or H2 blockers to reduce the risk of ulcerative changes from the chronic use of dexamethasone.  We discussed the rationale to proceed with a 3T MRI scan to further characterize and rule out further metastatic disease.  The patient's claustrophobia could potentially be an issue and he was offered to anesthetize scan.  He is interested in proceeding.  We could also use stronger dosing strategies for Ativan for his treatment.  We discussed the risks, benefits, short and long-term effects of radiotherapy as well as the delivery and logistics of making a mask to wear during treatment.  He is also interested in enrolling in our open facemask trial.  We will randomize him to the study.  He is aware that he would also be meeting with neurosurgery to plan his treatment, and will be coordinating this with our navigator who also was able to join the call.  We anticipate  treatment being in the next few weeks.  He will also continue plans for systemic therapy with Dr. Delton Coombes. 2. Claustrophobia.  The patient is counseled on the use of Ativan.  If he takes 2 mg of Xanax daily, I would be inclined to try giving him 2 mg of oral Ativan 30 minutes prior to simulation and treatment.  He is interested in proceeding and a prescription will be sent into his pharmacy.  He is counseled on not taking Xanax within 6 hours of taking Ativan the day of his planning and treatment.  3. Anxiety regarding treatment.  We talked about the use of Ativan for his claustrophobia which I also think will help with any associated anxiety.  The patient's family requests that he be able to have a family member onsite.  Despite the COVID endemic, we are making exceptions, and Dr. Lisbeth Renshaw approves this exception for the patient to have a family member on site.  His family member will be screened, and we will coordinate with him coming through the back entrance with nursing.  This encounter was provided by telemedicine platform Webex.  The patient has given verbal consent for this type of encounter and has been advised to only accept a meeting of this type in a secure network environment. The time spent during this encounter was 60 minutes. The attendants for this meeting include Blenda Nicely, RN, Dr. Lisbeth Renshaw, Mont Dutton, RT, Hayden Pedro  and Merita Norton.  During the encounter,  Blenda Nicely, RN, Dr. Lisbeth Renshaw, Ellie Lunch, RT and Hayden Pedro were located at [facility] Radiation Oncology Department.  Janzen G Ivanov was located at home.    The above documentation reflects my direct findings during this shared patient visit. Please see the separate note by Dr. Lisbeth Renshaw on this date for the remainder of the patient's plan of care.    Carola Rhine, PAC  Addendum: Unfortunately to  the delays in access to anesthesia based imaging due to coronavirus, the patient could  not have a general anesthesia MRI until mid August 2020.  He was contacted and offered the opportunity to try and pursue a 3T MRI with adequate Ativan dosing.  He is willing to try this and will be scheduled for this in the near future.    Carola Rhine, PAC

## 2018-10-30 ENCOUNTER — Encounter (HOSPITAL_COMMUNITY): Payer: Self-pay

## 2018-10-30 ENCOUNTER — Other Ambulatory Visit (HOSPITAL_COMMUNITY): Payer: Medicare Other

## 2018-10-30 ENCOUNTER — Ambulatory Visit (HOSPITAL_COMMUNITY): Payer: Medicare Other

## 2018-11-03 ENCOUNTER — Emergency Department (HOSPITAL_COMMUNITY): Payer: Medicare Other

## 2018-11-03 ENCOUNTER — Encounter (HOSPITAL_COMMUNITY): Payer: Self-pay | Admitting: *Deleted

## 2018-11-03 ENCOUNTER — Other Ambulatory Visit: Payer: Self-pay

## 2018-11-03 ENCOUNTER — Emergency Department (HOSPITAL_COMMUNITY)
Admission: EM | Admit: 2018-11-03 | Discharge: 2018-11-04 | Disposition: A | Discharge status: Home / Self Care | Payer: Medicare Other | Attending: Emergency Medicine | Admitting: Emergency Medicine

## 2018-11-03 DIAGNOSIS — R112 Nausea with vomiting, unspecified: Secondary | ICD-10-CM | POA: Diagnosis not present

## 2018-11-03 DIAGNOSIS — Z7984 Long term (current) use of oral hypoglycemic drugs: Secondary | ICD-10-CM | POA: Diagnosis not present

## 2018-11-03 DIAGNOSIS — I10 Essential (primary) hypertension: Secondary | ICD-10-CM | POA: Diagnosis not present

## 2018-11-03 DIAGNOSIS — R1030 Lower abdominal pain, unspecified: Secondary | ICD-10-CM | POA: Diagnosis not present

## 2018-11-03 DIAGNOSIS — R1084 Generalized abdominal pain: Secondary | ICD-10-CM | POA: Diagnosis not present

## 2018-11-03 DIAGNOSIS — E119 Type 2 diabetes mellitus without complications: Secondary | ICD-10-CM | POA: Diagnosis not present

## 2018-11-03 DIAGNOSIS — R197 Diarrhea, unspecified: Secondary | ICD-10-CM | POA: Diagnosis not present

## 2018-11-03 DIAGNOSIS — Z87891 Personal history of nicotine dependence: Secondary | ICD-10-CM | POA: Diagnosis not present

## 2018-11-03 DIAGNOSIS — R1110 Vomiting, unspecified: Secondary | ICD-10-CM | POA: Diagnosis not present

## 2018-11-03 DIAGNOSIS — Z85118 Personal history of other malignant neoplasm of bronchus and lung: Secondary | ICD-10-CM | POA: Diagnosis not present

## 2018-11-03 DIAGNOSIS — C719 Malignant neoplasm of brain, unspecified: Secondary | ICD-10-CM | POA: Diagnosis not present

## 2018-11-03 DIAGNOSIS — Z79899 Other long term (current) drug therapy: Secondary | ICD-10-CM | POA: Insufficient documentation

## 2018-11-03 DIAGNOSIS — R51 Headache: Secondary | ICD-10-CM | POA: Diagnosis not present

## 2018-11-03 LAB — COMPREHENSIVE METABOLIC PANEL
ALT: 20 U/L (ref 0–44)
AST: 14 U/L — ABNORMAL LOW (ref 15–41)
Albumin: 3.7 g/dL (ref 3.5–5.0)
Alkaline Phosphatase: 70 U/L (ref 38–126)
Anion gap: 9 (ref 5–15)
BUN: 16 mg/dL (ref 8–23)
CO2: 24 mmol/L (ref 22–32)
Calcium: 8.4 mg/dL — ABNORMAL LOW (ref 8.9–10.3)
Chloride: 106 mmol/L (ref 98–111)
Creatinine, Ser: 1.15 mg/dL (ref 0.61–1.24)
GFR calc Af Amer: >60 mL/min (ref >60)
GFR calc non Af Amer: >60 mL/min (ref >60)
Glucose, Bld: 164 mg/dL — ABNORMAL HIGH (ref 70–99)
Potassium: 4.1 mmol/L (ref 3.5–5.1)
Sodium: 139 mmol/L (ref 135–145)
Total Bilirubin: 0.6 mg/dL (ref 0.3–1.2)
Total Protein: 6.5 g/dL (ref 6.5–8.1)

## 2018-11-03 LAB — CBC
HCT: 45.3 % (ref 39.0–52.0)
Hemoglobin: 14.4 g/dL (ref 13.0–17.0)
MCH: 27.9 pg (ref 26.0–34.0)
MCHC: 31.8 g/dL (ref 30.0–36.0)
MCV: 87.8 fL (ref 80.0–100.0)
Platelets: 265 10*3/uL (ref 150–400)
RBC: 5.16 MIL/uL (ref 4.22–5.81)
RDW: 14.2 % (ref 11.5–15.5)
WBC: 8 10*3/uL (ref 4.0–10.5)
nRBC: 0 % (ref 0.0–0.2)

## 2018-11-03 LAB — LIPASE, BLOOD: Lipase: 22 U/L (ref 11–51)

## 2018-11-03 MED ORDER — ONDANSETRON HCL 4 MG PO TABS: 4.0000 mg | ORAL_TABLET | Freq: Three times a day (TID) | ORAL | 0 refills | Status: AC | PRN

## 2018-11-03 MED ORDER — SODIUM CHLORIDE 0.9 % IV BOLUS
1000.0000 mL | Freq: Once | INTRAVENOUS | Status: AC
  Administered 2018-11-03: 1000 mL via INTRAVENOUS

## 2018-11-03 MED ORDER — SODIUM CHLORIDE 0.9% FLUSH
3.0000 mL | Freq: Once | INTRAVENOUS | Status: AC
  Administered 2018-11-03: 3 mL via INTRAVENOUS

## 2018-11-03 MED ORDER — IOHEXOL 300 MG/ML  SOLN
100.0000 mL | Freq: Once | INTRAMUSCULAR | Status: AC | PRN
  Administered 2018-11-03: 100 mL via INTRAVENOUS

## 2018-11-03 NOTE — ED Notes (Signed)
Pt refused d/c vitals. Pt alert and ambulatory at d/c

## 2018-11-03 NOTE — ED Provider Notes (Signed)
[facility] EMERGENCY DEPARTMENT Provider Note   CSN: [id number] Arrival date & time: 11/03/18  1650     History   Chief Complaint Chief Complaint  Patient presents with   Emesis    HPI Darrell Christensen is a 73 y.o. male.     HPI   Darrell Christensen is a 73 y.o. male  With hx of lung CA with recently diagnosed mets to the brain, DM, and HTN.  [facility] is currently seeing [facility].  He presents to the Emergency Department complaining of diffuse lower abdominal pain, intermittent vomiting and loose stools.  Symptoms began earlier today.  He describes the pain as cramping and feeling "bloated"  Symptoms worsen with food intake. He also endorses frequent urination today.  He denies chest pain, shortness of breath, fever, cough or chills.  No burning,urgency  with urination or hematuria, no flank pain.  He also endorses frequent intermittent headaches.  No neck pain or stiffness.  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[facility])     Patient Active Problem List   Diagnosis Date Noted   Goals of care, counseling/discussion 10/15/2018   Abnormal CT of the abdomen 07/22/2017   Itching 10/10/2016   Diabetes mellitus ([place]) 05/02/2016   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[place])    Primary cancer of right upper lobe of lung ([place]) 04/01/2016   Breast nodule 04/01/2016   Adrenal mass, left ([place]) 04/01/2016   HTN (hypertension), benign 04/01/2016   Pulmonary embolus  ([place]) 03/31/2016    Past Surgical History:  Procedure Laterality Date   COLONOSCOPY N/A 07/28/2017   Procedure: COLONOSCOPY;  Surgeon: Rogene Houston, MD;  Location: [facility] END[facility];  Service: Endoscopy;  Laterality: N/A;  [place] Left 05/13/2016   Procedure: INSERTION PORT-A-CATH;  Surgeon: Aviva Signs, MD;  Location: [facility] ORS;  Service: General;  Laterality: Left;        Home Medications    Prior to Admission medications   Medication Sig Start Date End Date Taking? Authorizing Provider  ALPRAZolam [name]) 1 MG tablet Take 1 mg by mouth 3 (three) times daily as needed for anxiety.    [provider]  amLODipine (NORVASC) 2.5 MG tablet Take 2.5 mg by mouth daily. 03/22/16   [provider]  CARBOPLATIN IV Inject into the vein every 21 ( twenty-one) days. 10/23/18   [provider]  dexamethasone (DECADRON) 4 MG tablet Take 1 tablet (4 mg total) by mouth every 6 (six) hours. 10/28/18   Derek Jack, MD  folic acid (FOLVITE) 1 MG tablet Take 1 tablet (1 mg total) by mouth daily. Start 5-7 days before Alimta chemotherapy. Continue until 21 days after Alimta completed. 10/15/18   Derek Jack, MD  furosemide (LASIX) 20 MG tablet Take 2 tablets (40 mg total) by mouth daily. 11/25/16   Baird Cancer, PA-C  GLIPIZIDE XL 5 MG 24 hr tablet Take  5 mg by mouth daily. 03/22/16   [provider]  hydrocortisone cream 0.5 % Apply 1 application topically 2 (two) times daily. 05/24/16   Baird Cancer, PA-C  hydrOXYzine (ATARAX/VISTARIL) 25 MG tablet Take 1 tablet (25 mg total) by mouth 3 (three) times daily as needed. 06/11/18   Lockamy, Randi L, NP-C  lidocaine-prilocaine (EMLA) cream Apply a quarter size amount to affected area 1 hour prior to coming to chemotherapy. Cover with plastic wrap. 05/13/17   Ardath Sax, MD  LORazepam (ATIVAN) 2 MG tablet 1 tab po 30 minutes prior to radiation or MRI scans. Do not take Xanax within 6  hours of taking Ativan 10/29/18   Hayden Pedro, PA-C  metFORMIN (GLUCOPHAGE) 500 MG tablet Take 500 mg by mouth 2 (two) times daily. 03/07/16   [provider]  metoprolol succinate (TOPROL-XL) 25 MG 24 hr tablet TAKE ONE TABLET BY MOUTH IN THE EVENING. 03/26/17   Holley Bouche, NP  ondansetron (ZOFRAN) 8 MG tablet Take 8 mg by mouth every 8 (eight) hours as needed for nausea or vomiting.    [provider]  oxyCODONE-acetaminophen (PERCOCET) 10-325 MG tablet  06/18/17   [provider]  Pembrolizumab (KEYTRUDA IV) Inject into the vein.    [provider]  PEMEtrexed 500 mg/m2 in sodium chloride 0.9 % 100 mL Inject into the vein every 21 ( twenty-one) days. 10/23/18   [provider]  potassium chloride (K-DUR,KLOR-CON) 10 MEQ tablet  01/16/18   [provider]  prochlorperazine (COMPAZINE) 10 MG tablet Take 1 tablet (10 mg total) by mouth every 6 (six) hours as needed (Nausea or vomiting). 10/15/18   Derek Jack, MD  simvastatin (ZOCOR) 20 MG tablet Take 20 mg by mouth every evening. 03/07/16   [provider]  Skin Protectants, Misc. (EUCERIN) cream Apply topically as needed for dry skin. 08/09/16   Forest Gleason, MD  spironolactone (ALDACTONE) 50 MG tablet Take 1 tablet (50 mg total) by mouth daily. 11/25/16   Baird Cancer, PA-C  triamcinolone cream (KENALOG) 0.1 % Apply 1 application topically 2 (two) times daily.    [provider]    Family History Family History  Problem Relation Age of Onset   Stroke Mother    Heart attack Father    Heart attack Brother     Social History Social History   Tobacco Use   Smoking status: Former Smoker    Years: 61.00    Quit date: 04/11/2014    Years since quitting: 4.5   Smokeless tobacco: Never Used   Tobacco comment: patient does vape smoking x 2 years  Substance Use Topics   Alcohol use: Not Currently    Comment: quit 10 years   Drug use: No      Allergies   Patient has no known allergies.   Review of Systems Review of Systems  Constitutional: Positive for appetite change. Negative for chills and fever.  HENT: Negative for sore throat and trouble swallowing.   Respiratory: Negative for chest tightness and shortness of breath.   Cardiovascular: Negative for chest pain.  Gastrointestinal: Positive for abdominal pain, diarrhea, nausea and vomiting. Negative for blood in stool.  Genitourinary: Positive for frequency. Negative for decreased urine volume, difficulty urinating and flank pain.  Musculoskeletal: Negative for back pain and myalgias.  Skin: Negative for color change and rash.  Neurological: Positive for headaches. Negative for dizziness, weakness and numbness.  Hematological: Negative for adenopathy.  Physical Exam Updated Vital Signs BP (!) 171/90    Pulse 90    Temp 97.6 F (36.4 C)    Resp 18    Ht 5\' 11"  (1.803 m)    Wt 104.3 kg    SpO2 99%    BMI 32.08 kg/m   Physical Exam Vitals signs and nursing note reviewed.  Constitutional:      Appearance: Normal appearance. He is not ill-appearing or toxic-appearing.  HENT:     Head: Atraumatic.     Mouth/Throat:     Mouth: Mucous membranes are moist.  Eyes:     Extraocular Movements: Extraocular movements intact.     Pupils: Pupils are equal, round, and reactive to light.  Neck:     Musculoskeletal: Normal range of motion.  Cardiovascular:     Rate and Rhythm: Normal rate and regular rhythm.     Pulses: Normal pulses.  Pulmonary:     Effort: Pulmonary effort is normal.     Breath sounds: Normal breath sounds. No wheezing.  Musculoskeletal: Normal range of motion.  Skin:    General: Skin is warm.     Findings: No rash.  Neurological:     General: No focal deficit present.     Mental Status: He is alert and oriented to person, place, and time.     GCS: GCS eye subscore is 4. GCS verbal subscore is 5. GCS motor subscore is 6.     Sensory:  Sensation is intact. No sensory deficit.     Motor: Motor function is intact. No weakness or pronator drift.     Coordination: Coordination is intact.     Gait: Gait normal.     Comments: CN II-XII grossly intact.  Speech is slow, but clear.  nml finger nose testing. Pt seen ambulating in the halls prior to exam, gait steady.        ED Treatments / Results  Labs (all labs ordered are listed, but only abnormal results are displayed) Labs Reviewed  COMPREHENSIVE METABOLIC PANEL - Abnormal; Notable for the following components:      Result Value   Glucose, Bld 164 (*)    Calcium 8.4 (*)    AST 14 (*)    All other components within normal limits  LIPASE, BLOOD  CBC    EKG None  Radiology     Ct Abdomen Pelvis W Contrast  Result Date: 11/03/2018 CLINICAL DATA:  Nausea, vomiting, abdominal pain EXAM: CT ABDOMEN AND PELVIS WITH CONTRAST TECHNIQUE: Multidetector CT imaging of the abdomen and pelvis was performed using the standard protocol following bolus administration of intravenous contrast. CONTRAST:  159mL OMNIPAQUE IOHEXOL 300 MG/ML  SOLN COMPARISON:  09/21/2018 FINDINGS: Lower chest: Nodule within the right breast subareolar region is stable since prior study, measuring up to 15 mm. No acute abnormality. Hepatobiliary: No focal hepatic abnormality. Gallbladder unremarkable. Pancreas: No focal abnormality or ductal dilatation. Spleen: No focal abnormality. Normal size. Scattered calcifications. Adrenals/Urinary Tract: Enlargement of the left adrenal gland again noted, measuring up to 2.9 cm, stable. No renal mass or hydronephrosis. Urinary bladder unremarkable. Stomach/Bowel: Normal appendix. Stomach, large and small bowel grossly unremarkable. Vascular/Lymphatic: Aortic atherosclerosis. No enlarged abdominal or pelvic lymph nodes. Reproductive: Mild prostate enlargement. Other: No free fluid or free air. Small bilateral inguinal hernias containing fat. Musculoskeletal: No acute bony  abnormality. IMPRESSION: No acute findings in the abdomen or pelvis. Stable left adrenal enlargement/nodule. Aortic atherosclerosis. Mild prostate enlargement. Electronically Signed   By: Rolm Baptise M.D.  On: 11/03/2018 22:49     Procedures Procedures (including critical care time)  Medications Ordered in ED Medications  sodium chloride flush (NS) 0.9 % injection 3 mL (has no administration in time range)  sodium chloride 0.9 % bolus 1,000 mL (has no administration in time range)     Initial Impression / Assessment and Plan / ED Course  I have reviewed the triage vital signs and the nursing notes.  Pertinent labs & imaging results that were available during my care of the patient were reviewed by me and considered in my medical decision making (see chart for details).        Pt with diffuse lower abdominal pain, loose stools, vomiting and urinary frequency.  No fever, dyspnea, or chest pain.  Work up is reassuring, labs wnml, no vomiting here.  No concerning sx's for acute abdomen.  MR brain from 6/24 reviewed by me.  I was notified by nursing that pt is requesting d/c home.  I have discussed findings with him, I feel he is appropriate for d/c, he agrees to close f/u with PCP.  Return precautions discussed.     Final Clinical Impressions(s) / ED Diagnoses   Final diagnoses:  Generalized abdominal pain    ED Discharge Orders    None       Bufford Lope 11/05/18 2156    Margette Fast, MD 11/06/18 1318

## 2018-11-03 NOTE — ED Triage Notes (Signed)
Pt c/o vomiting, lower abdominal pain, loose stools that started today. Pt has lung cancer and has been recently been diagnosed with tumor in brain and has been disoriented recently.

## 2018-11-03 NOTE — Discharge Instructions (Addendum)
Follow-up with your primary doctor for recheck.  Return here for any worsening symptoms such as fever, vomiting or increasing pain

## 2018-11-04 ENCOUNTER — Ambulatory Visit (HOSPITAL_COMMUNITY): Payer: Medicare Other | Admitting: Hematology

## 2018-11-04 MED FILL — Ondansetron HCl Tab 4 MG: ORAL | Qty: 4 | Status: AC

## 2018-11-09 ENCOUNTER — Other Ambulatory Visit (HOSPITAL_COMMUNITY): Payer: Self-pay | Admitting: *Deleted

## 2018-11-09 DIAGNOSIS — C3411 Malignant neoplasm of upper lobe, right bronchus or lung: Secondary | ICD-10-CM

## 2018-11-09 NOTE — Progress Notes (Signed)
Home health referral placed

## 2018-11-10 ENCOUNTER — Encounter (HOSPITAL_COMMUNITY): Payer: Self-pay | Admitting: *Deleted

## 2018-11-10 DIAGNOSIS — C3411 Malignant neoplasm of upper lobe, right bronchus or lung: Secondary | ICD-10-CM | POA: Diagnosis not present

## 2018-11-10 NOTE — Progress Notes (Signed)
I received a call from [employer].  She went out to the first visit with Darrell Christensen and he has some additional needs. He needs Occupational therapy, social work referral and speech therapy.  I have given her a verbal order for his needs and she will fax over their orders for Dr. Delton Coombes to sign.

## 2018-11-12 ENCOUNTER — Other Ambulatory Visit: Payer: Self-pay

## 2018-11-12 ENCOUNTER — Ambulatory Visit (HOSPITAL_COMMUNITY)
Admission: RE | Admit: 2018-11-12 | Discharge: 2018-11-12 | Disposition: A | Discharge status: Home / Self Care | Payer: Medicare Other | Source: Ambulatory Visit | Attending: Radiation Oncology | Admitting: Radiation Oncology

## 2018-11-12 ENCOUNTER — Ambulatory Visit (HOSPITAL_COMMUNITY): Payer: Medicare Other | Admitting: Hematology

## 2018-11-12 ENCOUNTER — Other Ambulatory Visit (HOSPITAL_COMMUNITY): Payer: Medicare Other

## 2018-11-12 DIAGNOSIS — C7931 Secondary malignant neoplasm of brain: Secondary | ICD-10-CM | POA: Insufficient documentation

## 2018-11-12 DIAGNOSIS — C7949 Secondary malignant neoplasm of other parts of nervous system: Secondary | ICD-10-CM | POA: Insufficient documentation

## 2018-11-12 DIAGNOSIS — C3490 Malignant neoplasm of unspecified part of unspecified bronchus or lung: Secondary | ICD-10-CM | POA: Diagnosis not present

## 2018-11-12 MED ORDER — GADOBUTROL 1 MMOL/ML IV SOLN
10.0000 mL | Freq: Once | INTRAVENOUS | Status: AC | PRN
  Administered 2018-11-12: 10 mL via INTRAVENOUS

## 2018-11-12 MED ORDER — HEPARIN SOD (PORK) LOCK FLUSH 100 UNIT/ML IV SOLN
500.0000 [IU] | INTRAVENOUS | Status: AC | PRN
  Administered 2018-11-12: 16:00:00 500 [IU]

## 2018-11-13 ENCOUNTER — Ambulatory Visit
Admission: RE | Admit: 2018-11-13 | Discharge: 2018-11-13 | Disposition: A | Discharge status: Home / Self Care | Payer: Medicare Other | Source: Ambulatory Visit | Attending: Radiation Oncology | Admitting: Radiation Oncology

## 2018-11-13 ENCOUNTER — Encounter: Payer: Self-pay | Admitting: Radiation Therapy

## 2018-11-13 ENCOUNTER — Other Ambulatory Visit: Payer: Self-pay | Admitting: Radiation Therapy

## 2018-11-13 ENCOUNTER — Other Ambulatory Visit: Payer: Self-pay

## 2018-11-13 ENCOUNTER — Telehealth: Payer: Self-pay | Admitting: *Deleted

## 2018-11-13 ENCOUNTER — Ambulatory Visit (HOSPITAL_COMMUNITY): Payer: Medicare Other

## 2018-11-13 VITALS — BP 118/99 | HR 71 | Temp 99.8°F | Resp 20 | Ht 71.0 in | Wt 206.4 lb

## 2018-11-13 DIAGNOSIS — Z510 Encounter for antineoplastic radiation therapy: Secondary | ICD-10-CM | POA: Diagnosis not present

## 2018-11-13 DIAGNOSIS — C7931 Secondary malignant neoplasm of brain: Secondary | ICD-10-CM

## 2018-11-13 DIAGNOSIS — C3411 Malignant neoplasm of upper lobe, right bronchus or lung: Secondary | ICD-10-CM | POA: Diagnosis not present

## 2018-11-13 DIAGNOSIS — D496 Neoplasm of unspecified behavior of brain: Secondary | ICD-10-CM | POA: Diagnosis not present

## 2018-11-13 MED ORDER — SODIUM CHLORIDE 0.9% FLUSH
10.0000 mL | Freq: Once | INTRAVENOUS | Status: AC
  Administered 2018-11-13: 10 mL via INTRAVENOUS

## 2018-11-13 NOTE — Telephone Encounter (Signed)
Called the patient and was unable to leave a voicemail.  Called his daughter and left a voicemail inquiring if he would make it to his appointment today.  Left my call back number.  Will continue to follow as necessary.  Gloriajean Dell. Leonie Green, BSN

## 2018-11-13 NOTE — Progress Notes (Signed)
Order for Bun and Creat to be drawn at [place] center on 7/13 have been entered.   Pt needs labs drawn in order to check kidney function prior to resuming Metformin. Metformin held starting on 7/10 in order to give IV contrast during radiation planning simulation.   Mont Dutton R.T.(R)(T) Special Procedures Navigator  [phone]

## 2018-11-13 NOTE — Progress Notes (Signed)
The patient was offered enrollment on our single institutional trial investigating open-faced vs closed-face head masks used to immobilize patients during stereotactic brain radiosurgery. The patient has elected to enroll on this trial. In regards to the trial, the patient has voluntarilysigned copiesof the consent forms and all trial related questions were answered.   Mr. Busic drew # 18 and randomized to have the open face mask. All paperwork was filled out and given to Claudean Severance for documentation.   Mont Dutton R.T.(R)(T) Special Procedures Navigator

## 2018-11-13 NOTE — Progress Notes (Signed)
Has armband been applied?  Yes  Does patient have an allergy to IV contrast dye?: No   Has patient ever received premedication for IV contrast dye?: N/A  Does patient take metformin?: Yes  If patient does take metformin when was the last dose: 11/12/2018  Date of lab work: 11/03/2018 BUN: 16 CR: 1.15 Gfr: >60  IV site: Right AC  Has IV site been added to flowsheet?  Yes

## 2018-11-16 ENCOUNTER — Other Ambulatory Visit (HOSPITAL_COMMUNITY)
Admission: RE | Admit: 2018-11-16 | Discharge: 2018-11-16 | Disposition: A | Discharge status: Home / Self Care | Payer: Medicare Other | Source: Ambulatory Visit | Attending: Radiation Oncology | Admitting: Radiation Oncology

## 2018-11-16 DIAGNOSIS — C7949 Secondary malignant neoplasm of other parts of nervous system: Secondary | ICD-10-CM | POA: Insufficient documentation

## 2018-11-16 DIAGNOSIS — C7931 Secondary malignant neoplasm of brain: Secondary | ICD-10-CM | POA: Diagnosis not present

## 2018-11-16 LAB — CREATININE, SERUM
Creatinine, Ser: 1.27 mg/dL — ABNORMAL HIGH (ref 0.61–1.24)
GFR calc Af Amer: >60 mL/min (ref >60)
GFR calc non Af Amer: 55 mL/min — ABNORMAL LOW (ref >60)

## 2018-11-16 LAB — BUN: BUN: 19 mg/dL (ref 8–23)

## 2018-11-17 ENCOUNTER — Telehealth: Payer: Self-pay | Admitting: Radiation Therapy

## 2018-11-17 DIAGNOSIS — C7931 Secondary malignant neoplasm of brain: Secondary | ICD-10-CM | POA: Diagnosis not present

## 2018-11-17 DIAGNOSIS — C3411 Malignant neoplasm of upper lobe, right bronchus or lung: Secondary | ICD-10-CM | POA: Diagnosis not present

## 2018-11-17 DIAGNOSIS — Z510 Encounter for antineoplastic radiation therapy: Secondary | ICD-10-CM | POA: Diagnosis not present

## 2018-11-17 NOTE — Telephone Encounter (Signed)
Spoke with patient's daughter, [name] to let her know that Mr. Steveson can resume taking his  Metformin. His Creat level is a little high, but this is being followed by his PCP, Dr. [name]. [name] reports that they are working on getting Mr. Recchia set up for an appointment to see Dr. [name] and have more labs in the near future.   Mr. Mcnerney treatment time was again verified for tomorrow, 7/15 @ 2:15. [name] knows to have him here at 2:00 to check in.    Mont Dutton R.T.(R)(T) Special Procedures Navigator

## 2018-11-18 ENCOUNTER — Telehealth (HOSPITAL_COMMUNITY): Payer: Self-pay | Admitting: Emergency Medicine

## 2018-11-18 ENCOUNTER — Other Ambulatory Visit (HOSPITAL_COMMUNITY): Payer: Medicare Other

## 2018-11-18 ENCOUNTER — Other Ambulatory Visit: Payer: Self-pay | Admitting: Radiation Oncology

## 2018-11-18 ENCOUNTER — Ambulatory Visit (HOSPITAL_COMMUNITY): Payer: Medicare Other | Admitting: Hematology

## 2018-11-18 ENCOUNTER — Ambulatory Visit (HOSPITAL_COMMUNITY): Payer: Medicare Other

## 2018-11-18 ENCOUNTER — Ambulatory Visit
Admission: RE | Admit: 2018-11-18 | Discharge: 2018-11-18 | Disposition: A | Discharge status: Home / Self Care | Payer: Medicare Other | Source: Ambulatory Visit | Attending: Radiation Oncology | Admitting: Radiation Oncology

## 2018-11-18 DIAGNOSIS — C7931 Secondary malignant neoplasm of brain: Secondary | ICD-10-CM | POA: Diagnosis not present

## 2018-11-18 DIAGNOSIS — Z510 Encounter for antineoplastic radiation therapy: Secondary | ICD-10-CM | POA: Diagnosis not present

## 2018-11-18 DIAGNOSIS — C3411 Malignant neoplasm of upper lobe, right bronchus or lung: Secondary | ICD-10-CM | POA: Diagnosis not present

## 2018-11-18 MED ORDER — NYSTATIN 100000 UNIT/ML MT SUSP: 5.0000 mL | Freq: Four times a day (QID) | OROMUCOSAL | 1 refills | Status: DC

## 2018-11-18 NOTE — Telephone Encounter (Signed)
[facility] called to inform us that patient is having issues swallowing. He has pain with swallowing. Having poor intake of solids. He is seeing Dr.Moody today for radiation. She asked Dr.Katragadda be made aware.

## 2018-11-18 NOTE — Progress Notes (Signed)
Pt on immunotherapy and planning SRS to the brain for primary lung cancer. C/o dysphagia with eating. We will try nystatin suspension given ongoing steroid [name] as well. If this does not improve, he may need further work up.

## 2018-11-18 NOTE — Progress Notes (Addendum)
Patient rested with [name] for 15 minutes following his SRS treatment. Patient denies headache, dizziness, nausea, diplopia or ringing in the ears. Denies fatigue.Understands to avoid strenuous activity for the next 24 hours and call [phone] with needs. Patient  Voices difficulty and pain with swallowing.  Physician notified and prescription was sent in for nystatin suspension.  Patient as well as daughter were given instructions for use.  Both verbalized understanding.  Will continue to follow as necessary.  Gloriajean Dell. Quincy Simmonds RN, BSN   BP 132/75 (BP Location: Left Arm, Patient Position: Sitting)   Pulse (!) 58   Temp 99.1 F (37.3 C)   Resp 20   SpO2 97%

## 2018-11-19 ENCOUNTER — Other Ambulatory Visit (HOSPITAL_COMMUNITY): Payer: Self-pay | Admitting: *Deleted

## 2018-11-19 MED ORDER — DEXAMETHASONE 4 MG PO TABS: 4.0000 mg | ORAL_TABLET | Freq: Four times a day (QID) | ORAL | 0 refills | Status: DC

## 2018-11-19 NOTE — Progress Notes (Signed)
  Name: Darrell Christensen  MRN: [id number]  Date: 11/18/2018   DOB: July 28, 1945  Stereotactic Radiosurgery Operative Note  PRE-OPERATIVE DIAGNOSIS:  Solitary Brain Metastasis  POST-OPERATIVE DIAGNOSIS:  Solitary Brain Metastasis  PROCEDURE:  Stereotactic Radiosurgery  SURGEON:  Judith Part, MD  NARRATIVE: The patient underwent a radiation treatment planning session in the radiation oncology simulation suite under the care of the radiation oncology physician and physicist.  I participated closely in the radiation treatment planning afterwards. The patient underwent planning CT which was fused to 3T high resolution MRI with 1 mm axial slices.  These images were fused on the planning system.  We contoured the gross target volumes and subsequently expanded this to yield the Planning Target Volume. I actively participated in the planning process.  I helped to define and review the target contours and also the contours of the optic pathway, eyes, brainstem and selected nearby organs at risk.  All the dose constraints for critical structures were reviewed and compared to AAPM Task Group 101.  The prescription dose conformity was reviewed.  I approved the plan electronically.    Accordingly, [name] was brought to the TrueBeam stereotactic radiation treatment linac and placed in the custom immobilization mask.  The patient was aligned according to the IR fiducial markers with BrainLab Exactrac, then orthogonal x-rays were used in ExacTrac with the 6DOF robotic table and the shifts were made to align the patient  [name] received stereotactic radiosurgery uneventfully.    Lesions treated:  1   Complex lesions treated:  0 (>3.5 cm, <41mm of optic path, or within the brainstem)   The detailed description of the procedure is recorded in the radiation oncology procedure note.  I was present for the duration of the procedure.  DISPOSITION:  Following delivery, the patient was transported to  nursing in stable condition and monitored for possible acute effects to be discharged to home in stable condition with follow-up in one month.  Judith Part, MD 11/19/2018 8:26 AM

## 2018-11-20 ENCOUNTER — Ambulatory Visit
Admission: RE | Admit: 2018-11-20 | Discharge: 2018-11-20 | Disposition: A | Discharge status: Home / Self Care | Payer: Medicare Other | Source: Ambulatory Visit | Attending: Radiation Oncology | Admitting: Radiation Oncology

## 2018-11-20 DIAGNOSIS — C3411 Malignant neoplasm of upper lobe, right bronchus or lung: Secondary | ICD-10-CM | POA: Diagnosis not present

## 2018-11-20 DIAGNOSIS — Z510 Encounter for antineoplastic radiation therapy: Secondary | ICD-10-CM | POA: Diagnosis not present

## 2018-11-20 DIAGNOSIS — C7931 Secondary malignant neoplasm of brain: Secondary | ICD-10-CM | POA: Diagnosis not present

## 2018-11-20 NOTE — Progress Notes (Signed)
Mr. Mcqueen rested with [name] for 15 minutes following his SRS treatment.  Patient denies headache, dizziness, nausea, diplopia or ringing in the ears. Denies fatigue. Patient without complaints. Understands to avoid strenuous activity for the next 24 hours and call [phone] with needs.   BP (!) 143/89 (BP Location: Left Arm, Patient Position: Sitting)   Pulse (!) 112   Temp 98.7 F (37.1 C) (Oral)   Resp (!) 24   SpO2 94%    Yaire Kreher M. Leonie Green, BSN

## 2018-11-23 ENCOUNTER — Ambulatory Visit (HOSPITAL_COMMUNITY): Payer: Medicare Other | Admitting: Hematology

## 2018-11-23 ENCOUNTER — Other Ambulatory Visit (HOSPITAL_COMMUNITY): Payer: Medicare Other

## 2018-11-23 ENCOUNTER — Ambulatory Visit (HOSPITAL_COMMUNITY): Payer: Medicare Other

## 2018-11-24 ENCOUNTER — Other Ambulatory Visit: Payer: Self-pay

## 2018-11-24 ENCOUNTER — Ambulatory Visit
Admission: RE | Admit: 2018-11-24 | Discharge: 2018-11-24 | Disposition: A | Discharge status: Home / Self Care | Payer: Medicare Other | Source: Ambulatory Visit | Attending: Radiation Oncology | Admitting: Radiation Oncology

## 2018-11-24 ENCOUNTER — Encounter: Payer: Self-pay | Admitting: Radiation Oncology

## 2018-11-24 DIAGNOSIS — Z510 Encounter for antineoplastic radiation therapy: Secondary | ICD-10-CM | POA: Diagnosis not present

## 2018-11-24 DIAGNOSIS — C3411 Malignant neoplasm of upper lobe, right bronchus or lung: Secondary | ICD-10-CM | POA: Diagnosis not present

## 2018-11-24 DIAGNOSIS — C7931 Secondary malignant neoplasm of brain: Secondary | ICD-10-CM | POA: Diagnosis not present

## 2018-11-24 NOTE — Progress Notes (Addendum)
Darrell Christensen rested with [name] for 15 minutes following his SRS treatment.  Patient denies headache, dizziness, nausea, diplopia or ringing in the ears. Denies fatigue. Patient without complaints. Understands to avoid strenuous activity for the next 24 hours and call [phone] with needs. Patient was informed to try melatonin over the counter to help him sleep.  He was given steroid taper written instructions.  Daughter was informed of these instructions.   Steroid Taper 2 mg bid tomorrow, then 7/29 2 mg daily, the. 8/5 2mg  every other day, stop 8/17    BP (!) 141/98 (BP Location: Left Arm)   Pulse (!) 107   Temp 98.9 F (37.2 C) (Oral)   Resp 18   SpO2 98%

## 2018-11-27 DIAGNOSIS — C7931 Secondary malignant neoplasm of brain: Secondary | ICD-10-CM | POA: Insufficient documentation

## 2018-11-27 NOTE — Progress Notes (Signed)
  Radiation Oncology         ([phone] ________________________________  Stereotactic Treatment Procedure Note   ICD-10-CM   1. Brain metastasis [facility])  C79.31    Outpatient  Name: MATIS MONNIER MRN: [id number]  Date: 11/24/2018  DOB: 12-15-1945  SPECIAL TREATMENT PROCEDURE  3D TREATMENT PLANNING AND DOSIMETRY:  The patient's radiation plan was reviewed and approved by neurosurgery and radiation oncology prior to treatment.  It showed 3-dimensional radiation distributions overlaid onto the planning CT/MRI image set.  The [facility] for the target structures as well as the organs at risk were reviewed. The documentation of the 3D plan and dosimetry are filed in the radiation oncology EMR.  NARRATIVE:  Andric G Walz was brought to the TrueBeam stereotactic radiation treatment machine and placed supine on the CT couch. The head frame was applied, and the patient was set up for stereotactic radiosurgery.  Neurosurgery was present for the set-up and delivery  SIMULATION VERIFICATION:  In the couch zero-angle position, the patient underwent Exactrac imaging using the Brainlab system with orthogonal KV images.  These were carefully aligned and repeated to confirm treatment position for each of the isocenters.  The Exactrac snap film verification was repeated at each couch angle.  SPECIAL TREATMENT PROCEDURE: Gianmarco G Ziebarth received stereotactic radiosurgery to the following targets:   ExacTrac, 4 VMAT beams Max dose = 128.6% PTV1 Rt Parietal 13mm 9Gy delivered  This constitutes a special treatment procedure due to the ablative dose delivered and the technical nature of treatment.  This highly technical modality of treatment ensures that the ablative dose is centered on the patient's tumor while sparing normal tissues from excessive dose and risk of detrimental effects.  STEREOTACTIC TREATMENT MANAGEMENT:  Following delivery, the patient was transported to nursing in stable condition and monitored  for possible acute effects.  Vital signs were recorded BP (!) 141/98 (BP Location: Left Arm)   Pulse (!) 107   Temp 98.9 F (37.2 C) (Oral)   Resp 18   SpO2 98% . The patient tolerated treatment without significant acute effects, and was discharged to home in stable condition.    PLAN: Follow-up in 1 mo  ________________________________   Eppie Gibson, MD

## 2018-12-04 ENCOUNTER — Telehealth: Payer: Self-pay | Admitting: *Deleted

## 2018-12-04 NOTE — Telephone Encounter (Signed)
Spoke with the patients daughter to let her know that his follow-up appointment is scheduled for August 24th at 3:00 pm.  She verbalized understanding.  Will continue to follow as necessary.  Gloriajean Dell. Leonie Green, BSN

## 2018-12-04 NOTE — Telephone Encounter (Signed)
Spoke with the patient's daughter regarding her father's worsening headaches since he started his steroid taper.  After discussion with Bryson Ha PA I let her know that she would like him to go back to taking his steroid 2 mg BID for another week and then start taking 2 mg daily.  Informed her to let us know if anything changes and if his headaches worsen.  Call back number left.  Will continue to follow as necessary.  12/05/2018: start 2 mg BID 12/12/2018: 2 mg daily  Daneshia Tavano M. Leonie Green, BSN

## 2018-12-05 ENCOUNTER — Ambulatory Visit (HOSPITAL_COMMUNITY): Payer: Medicare Other

## 2018-12-08 ENCOUNTER — Ambulatory Visit: Admit: 2018-12-08 | Payer: Medicare Other

## 2018-12-08 ENCOUNTER — Ambulatory Visit (HOSPITAL_COMMUNITY): Payer: Medicare Other

## 2018-12-08 SURGERY — MRI WITH ANESTHESIA
Anesthesia: General

## 2018-12-10 ENCOUNTER — Inpatient Hospital Stay (HOSPITAL_COMMUNITY): Payer: Medicare Other | Attending: Hematology | Admitting: Hematology

## 2018-12-10 ENCOUNTER — Other Ambulatory Visit: Payer: Self-pay

## 2018-12-10 ENCOUNTER — Encounter (HOSPITAL_COMMUNITY): Payer: Self-pay | Admitting: Hematology

## 2018-12-10 VITALS — BP 166/85 | HR 107 | Temp 98.4°F | Resp 16 | Wt 209.0 lb

## 2018-12-10 DIAGNOSIS — Z5111 Encounter for antineoplastic chemotherapy: Secondary | ICD-10-CM | POA: Diagnosis not present

## 2018-12-10 DIAGNOSIS — Z79899 Other long term (current) drug therapy: Secondary | ICD-10-CM | POA: Diagnosis not present

## 2018-12-10 DIAGNOSIS — C3411 Malignant neoplasm of upper lobe, right bronchus or lung: Secondary | ICD-10-CM | POA: Diagnosis not present

## 2018-12-10 DIAGNOSIS — C7931 Secondary malignant neoplasm of brain: Secondary | ICD-10-CM | POA: Insufficient documentation

## 2018-12-10 MED ORDER — CYANOCOBALAMIN 1000 MCG/ML IJ SOLN
1000.0000 ug | Freq: Once | INTRAMUSCULAR | Status: AC
  Administered 2018-12-10: 1000 ug via INTRAMUSCULAR
  Filled 2018-12-10: qty 1

## 2018-12-10 NOTE — Assessment & Plan Note (Signed)
1.  Stage IV adenocarcinoma of the lung, PDL 150%, other actionable mutations negative: - Diagnosed with metastatic disease on the right upper lobe, mediastinal and hilar adenopathy, descending mesocolon, left upper buttock soft tissue, left upper lobe, right subscapularis muscle, left adrenal gland and right breast subcutaneous tissue. -Keytruda from 05/17/2016 through 09/24/2018 with progression. - CT scan on 09/21/2018 showed slight progression in the right upper lobe. -He was seen by Dr. Julien Nordmann for second opinion.  He was recommended to have chemoimmunotherapy for 4 cycles with carboplatin pemetrexed and Keytruda followed by pemetrexed and Keytruda maintenance as the progression was minimal. - In the interim he developed brain metastasis. - He is now back home upon completion of radiation therapy.  He is accompanied by his daughter.  He is using a walker for ambulation.  He is able to do his daily activities at home.  He wants to get back on chemotherapy. - We talked about the combination regimen with carboplatin, pemetrexed and pembrolizumab.  We talked about side effects in detail.  He will receive vitamin B12 injection today.  He was told to take folic acid tablets daily. - We will tentatively plan to start his chemotherapy next week. - As his last scans were 2 months old, I have recommended a baseline CT of the chest.  He had a CT of the abdomen and pelvis on 11/03/2018.  Adrenal nodule on the left side was stable.  2.  Brain diastases: -Right parietal lobe 16.6 x 13.5 x 15.5 lesion on MRI dated 11/12/2018 with significant surrounding edema and mass-effect. - SRS to the brain lesion on 11/24/2018. -His slurring of speech has improved. -He is on dexamethasone taper of 2 mg daily.  He is reporting right-sided headache since the taper was started.  I have told him to increase it to 2 mg twice daily.

## 2018-12-10 NOTE — Patient Instructions (Addendum)
[place] at [facility] Discharge Instructions  You were seen today by Dr. Delton Coombes. He went over how you've been feeling.  He will see you back in 1 week for labs and follow up.   Thank you for choosing [place] at [facility] to provide your oncology and hematology care.  To afford each patient quality time with our provider, please arrive at least 15 minutes before your scheduled appointment time.   If you have a lab appointment with the [facility] please come in thru the  Main Entrance and check in at the main information desk  You need to re-schedule your appointment should you arrive 10 or more minutes late.  We strive to give you quality time with our providers, and arriving late affects you and other patients whose appointments are after yours.  Also, if you no show three or more times for appointments you may be dismissed from the clinic at the providers discretion.     Again, thank you for choosing [facility].  Our hope is that these requests will decrease the amount of time that you wait before being seen by our physicians.       _____________________________________________________________  Should you have questions after your visit to [facility], please contact our office at [phone] between the hours of 8:00 a.m. and 4:30 p.m.  Voicemails left after 4:00 p.m. will not be returned until the following business day.  For prescription refill requests, have your pharmacy contact our office and allow 72 hours.    Cancer Center Support Programs:   > Cancer Support Group  2nd Tuesday of the month 1pm-2pm, Journey Room

## 2018-12-10 NOTE — Progress Notes (Signed)
Darrell Christensen, [place] [address]   CLINIC:  Medical Oncology/Hematology  PCP:  Celene Squibb, MD [place] [address] [phone]   REASON FOR VISIT:  Follow-up for stage IV adenocarcinoma of the right lung   BRIEF ONCOLOGIC HISTORY:  Oncology History  Primary cancer of right upper lobe of lung ([place])  05/14/2015 Procedure   Port placement by Dr. Arnoldo Morale   04/01/2016 Initial Diagnosis   Primary cancer of right upper lobe of lung ([place])   04/02/2016 Procedure   Ultrasound-guided core biopsy performed of a solid 1.5 cm soft tissue nodule in the right chest wall.   04/03/2016 Imaging   MRI brain- No acute and intracranial process or metastasis.  Moderate chronic small vessel ischemic disease.   04/04/2016 Pathology Results   Diagnosis Soft Tissue Needle Core Biopsy, Right Chest Wall SMOOTH MUSCLE NEOPLASM Microscopic Comment The differential diagnosis include low grade leiomyosarcoma. The neoplasm shows spindle cell morphology with rare mitotic figures and minimal atypia, no necrosis present. These cells stains positive for smooth muscle actin, desmin, negative for cytokeratin AE1&3, ck8/18, s100, cd117, cd 34 and cd99. This case also reviewed by Dr. [name] and agree.   04/05/2016 Procedure   CT-guided biopsy of right upper lobe lesion, with tissue specimen sent to pathology, by IR.   04/09/2016 Pathology Results   Lung, needle/core biopsy(ies), Right Upper Lobe - NON SMALL CELL CARCINOMA.   04/17/2016 Pathology Results   PDL1 High Expression.  TPS 50%.   04/24/2016 PET scan   1. Prominently hypermetabolic large right upper lobe mass with metastatic disease to the right paratracheal and right hilar nodal chains, to the descending mesocolon, to the left upper buttock subcutaneous tissues, to the left upper lobe, to the right subscapularis muscle, and possibly to the left adrenal gland and right breast  subcutaneous tissues. Appearance compatible with stage IV lung cancer. 2. There is some focal high activity in the ascending colon which is probably from peristalsis, less likely from a local colon mass. This does raise the possibility that the adjacent mesocolon tumor implant could be due to synchronous colon cancer rather than lung metastasis. 3. Other imaging findings of potential clinical significance: Coronary, aortic arch, and branch vessel atherosclerotic vascular disease. Aortoiliac atherosclerotic vascular disease. Deformity left proximal humerus and left glenohumeral joint from prior trauma. Enlarged prostate gland.   04/26/2016 Pathology Results   FoundationONE: Genomic alterations identified- KRAS X83A, CREBBP splice site [id number], LRP1B S515*, AYO45 T97*, FSF42 splice site [id number], SPTA1 splice site [id number], TP53 C135Y.  Additional findings- MS-Stable, TMB-intermediate (14 Muts/Mb).  No reportable alterations- EGFR, ALK, BRAF, MET, ERBB2, RET, ROS1.   05/17/2016 -  Chemotherapy   The patient had ONDANSETRON IVPB [facility] +/- DEXAMETHASONE, , Intravenous,  Once, 0 of 1 cycle  pembrolizumab (KEYTRUDA) 200 mg in sodium chloride 0.9 % 50 mL chemo infusion, 200 mg, Intravenous, Once, 0 of 5 cycles  for chemotherapy treatment.     08/06/2016 PET scan   IMPRESSION: 1. Overall considerable improvement. The right upper lobe mass is markedly reduced in volume and SUV. Thoracic adenopathy have resolved and the hypermetabolic lesion in the right subscapularis muscle has also resolved. 2. The left upper lobe nodule is no longer appreciably hypermetabolic, and is highly indistinct although this may be due to motion artifact. Faint in indistinct nodularity elsewhere in the lungs likewise not currently hypermetabolic. 3. There is some very faint residual low-grade activity along the subcutaneous deposit  along the upper left buttock region. Marked reduction in size and activity of  the tumor deposit adjacent to the descending colon. 4. Prior right pleural effusion has resolved. 5. Stable appearance of the adrenal glands, with low-level activity and a nodule in the left adrenal gland. 6. Stable small nodule in the right breast, low-grade metabolic activity with maximum SUV 2.2 (formerly 3.0) 7. Other imaging findings of potential clinical significance: Severe arthropathy of the left glenohumeral joint. Coronary, aortic arch, and branch vessel atherosclerotic vascular disease. Aortoiliac atherosclerotic vascular disease. Prominent prostate gland indents the bladder base.    10/30/2018 -  Chemotherapy   The patient had palonosetron (ALOXI) injection 0.25 mg, 0.25 mg, Intravenous,  Once, 0 of 4 cycles pegfilgrastim (NEULASTA ONPRO KIT) injection 6 mg, 6 mg, Subcutaneous, Once, 0 of 6 cycles PEMEtrexed (ALIMTA) 1,125 mg in sodium chloride 0.9 % 100 mL chemo infusion, 500 mg/m2 = 1,125 mg, Intravenous,  Once, 0 of 6 cycles CARBOplatin (PARAPLATIN) 480 mg in sodium chloride 0.9 % 250 mL chemo infusion, 480 mg (100 % of original dose 477.5 mg), Intravenous,  Once, 0 of 4 cycles Dose modification:   (original dose 477.5 mg, Cycle 1) pembrolizumab (KEYTRUDA) 200 mg in sodium chloride 0.9 % 50 mL chemo infusion, 200 mg, Intravenous, Once, 0 of 6 cycles fosaprepitant (EMEND) 150 mg, dexamethasone (DECADRON) 12 mg in sodium chloride 0.9 % 145 mL IVPB, , Intravenous,  Once, 0 of 4 cycles  for chemotherapy treatment.    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  Two adjacent large masslike areas of consolidation within the right upper lobe with differential considerations including malignancy or pneumonia. Multiple enlarged right hilar and mediastinal lymph nodes which may be reactive or metastatic in etiology.  Additional indeterminate  pulmonary nodules as above. Recommend attention on follow-up.  Indeterminate 2.9 cm left adrenal nodule. This needs dedicated evaluation with pre and post contrast-enhanced CT or MRI after confirmation of pulmonary process.  Indeterminate nodule within the right breast. Recommend dedicated evaluation with mammography.  Hepatic steatosis.   04/02/2016 Procedure   Ultrasound-guided core biopsy performed of a solid 1.5 cm soft tissue nodule in the right chest wall.   04/04/2016 Pathology Results   Diagnosis Soft Tissue Needle Core Biopsy, Right Chest Wall SMOOTH MUSCLE NEOPLASM Microscopic Comment The differential diagnosis include low grade leiomyosarcoma. The neoplasm shows spindle cell morphology with rare mitotic figures and minimal atypia, no necrosis present. These cells stains positive for smooth muscle actin, desmin, negative for cytokeratin AE1&3, ck8/18, s100, cd117, cd 34 and cd99. This case also reviewed by Dr. [name] and agree.       CANCER STAGING: Cancer Staging Primary cancer of right upper lobe of lung ([place]) Staging form: Lung, AJCC 8th Edition - Clinical: Stage IVB (cT3, cN2, cM1c) - Signed by Baird Cancer, PA-C on 05/07/2016    INTERVAL HISTORY:  Darrell Christensen 73 y.o. male seen for follow-up of metastatic lung cancer.  He had SRS of the brain lesion done on 11/24/2018.  He is accompanied by his daughter.  He is back to his house.  While getting SRS, he was staying with his brother in [place] for the last 3 to 4 weeks.  He is walking with a walker.  Overall his strength is better.  He has not fallen.  He has some right-sided headaches.  He is currently taking dexamethasone 2  mg daily.  Appetite is 75%.  Energy levels are 25%.  Denies any nausea vomiting or diarrhea.  Denies any fevers or infections.  No cough was reported.    REVIEW OF SYSTEMS:  Review of Systems  Respiratory: Negative for shortness of breath.   Cardiovascular: Positive for leg  swelling.  Skin: Negative for itching.  Neurological: Positive for headaches and numbness.  All other systems reviewed and are negative.    PAST MEDICAL/SURGICAL HISTORY:  Past Medical History:  Diagnosis Date  . Adrenal mass, left ([place]) 04/01/2016  . Diabetes mellitus ([place]) 05/02/2016  . Diabetes mellitus without complication ([place])   . Hypertension   . Mass of breast, right 04/01/2016  . Mass of upper lobe of right lung 04/01/2016   2 adjacent, 5 cm masses, hilar adenopathy  . Primary cancer of right upper lobe of lung ([place]) 04/01/2016   2 adjacent, 5 cm masses, hilar adenopathy  . Pulmonary embolus ([place]) 03/31/2016  . Spindle cell sarcoma  [facility])    Past Surgical History:  Procedure Laterality Date  . COLONOSCOPY N/A 07/28/2017   Procedure: COLONOSCOPY;  Surgeon: Rogene Houston, MD;  Location: [facility] ENDO SUITE;  Service: Endoscopy;  Laterality: N/A;  205  . PORTACATH PLACEMENT Left 05/13/2016   Procedure: INSERTION PORT-A-CATH;  Surgeon: Aviva Signs, MD;  Location: [facility] ORS;  Service: General;  Laterality: Left;     SOCIAL HISTORY:  Social History   Socioeconomic History  . Marital status: Divorced    Spouse name: Not on file  . Number of children: Not on file  . Years of education: Not on file  . Highest education level: Not on file  Occupational History  . Not on file  Social Needs  . Financial resource strain: Not on file  . Food insecurity    Worry: Not on file    Inability: Not on file  . Transportation needs    Medical: No    Non-medical: No  Tobacco Use  . Smoking status: Former Smoker    Years: 61.00    Quit date: 04/11/2014    Years since quitting: 4.6  . Smokeless tobacco: Never Used  . Tobacco comment: patient does vape smoking x 2 years  Substance and Sexual Activity  . Alcohol use: Not Currently    Comment: quit 10 years  . Drug use: No  . Sexual activity: Not on file  Lifestyle  . Physical activity    Days per week: Not on file    Minutes per  session: Not on file  . Stress: Not on file  Relationships  . Social Herbalist on phone: Not on file    Gets together: Not on file    Attends religious service: Not on file    Active member of club or organization: Not on file    Attends meetings of clubs or organizations: Not on file    Relationship status: Not on file  . Intimate partner violence    Fear of current or ex partner: Not on file    Emotionally abused: Not on file    Physically abused: Not on file    Forced sexual activity: Not on file  Other Topics Concern  . Not on file  Social History Narrative  . Not on file    FAMILY HISTORY:  Family History  Problem Relation Age of Onset  . Stroke Mother   . Heart attack Father   . Heart attack Brother     CURRENT  MEDICATIONS:  Outpatient Encounter Medications as of 12/10/2018  Medication Sig  . ALPRAZolam (XANAX) 1 MG tablet Take 1 mg by mouth 3 (three) times daily as needed for anxiety.  Marland Kitchen amLODipine (NORVASC) 2.5 MG tablet Take 2.5 mg by mouth daily.  Marland Kitchen CARBOPLATIN IV Inject into the vein every 21 ( twenty-one) days.  . folic acid (FOLVITE) 1 MG tablet Take 1 tablet (1 mg total) by mouth daily. Start 5-7 days before Alimta chemotherapy. Continue until 21 days after Alimta completed.  . furosemide (LASIX) 20 MG tablet Take 2 tablets (40 mg total) by mouth daily.  Marland Kitchen GLIPIZIDE XL 5 MG 24 hr tablet Take 5 mg by mouth daily.  . hydrocortisone cream 0.5 % Apply 1 application topically 2 (two) times daily.  . hydrOXYzine (ATARAX/VISTARIL) 25 MG tablet Take 1 tablet (25 mg total) by mouth 3 (three) times daily as needed.  . lidocaine-prilocaine (EMLA) cream Apply a quarter size amount to affected area 1 hour prior to coming to chemotherapy. Cover with plastic wrap.  Marland Kitchen LORazepam (ATIVAN) 2 MG tablet 1 tab po 30 minutes prior to radiation or MRI scans. Do not take Xanax within 6 hours of taking Ativan  . metFORMIN (GLUCOPHAGE) 500 MG tablet Take 500 mg by mouth 2 (two)  times daily.  . metoprolol succinate (TOPROL-XL) 25 MG 24 hr tablet TAKE ONE TABLET BY MOUTH IN THE EVENING.  Marland Kitchen nystatin (MYCOSTATIN) 100000 UNIT/ML suspension Take 5 mLs (500,000 Units total) by mouth 4 (four) times daily.  . ondansetron (ZOFRAN) 4 MG tablet Take 1 tablet (4 mg total) by mouth every 8 (eight) hours as needed for nausea or vomiting.  Marland Kitchen oxyCODONE-acetaminophen (PERCOCET) 10-325 MG tablet   . Pembrolizumab (KEYTRUDA IV) Inject into the vein.  Marland Kitchen PEMEtrexed 500 mg/m2 in sodium chloride 0.9 % 100 mL Inject into the vein every 21 ( twenty-one) days.  . potassium chloride (K-DUR,KLOR-CON) 10 MEQ tablet   . prochlorperazine (COMPAZINE) 10 MG tablet Take 1 tablet (10 mg total) by mouth every 6 (six) hours as needed (Nausea or vomiting).  . simvastatin (ZOCOR) 20 MG tablet Take 20 mg by mouth every evening.  . Skin Protectants, Misc. (EUCERIN) cream Apply topically as needed for dry skin.  Marland Kitchen spironolactone (ALDACTONE) 50 MG tablet Take 1 tablet (50 mg total) by mouth daily.  Marland Kitchen triamcinolone cream (KENALOG) 0.1 % Apply 1 application topically 2 (two) times daily.  Marland Kitchen dexamethasone (DECADRON) 4 MG tablet Take 1 tablet (4 mg total) by mouth every 6 (six) hours. (Patient not taking: Reported on 12/10/2018)  . [EXPIRED] cyanocobalamin ((VITAMIN B-12)) injection 1,000 mcg    No facility-administered encounter medications on file as of 12/10/2018.     ALLERGIES:  No Known Allergies   PHYSICAL EXAM:  ECOG Performance status: 1  Vitals:   12/10/18 1400  BP: (!) 166/85  Pulse: (!) 107  Resp: 16  Temp: 98.4 F (36.9 C)  SpO2: 96%   Filed Weights   12/10/18 1400  Weight: 209 lb (94.8 kg)    Physical Exam Vitals signs reviewed.  Constitutional:      Appearance: Normal appearance.  Cardiovascular:     Rate and Rhythm: Normal rate and regular rhythm.     Heart sounds: Normal heart sounds.  Pulmonary:     Effort: Pulmonary effort is normal.     Breath sounds: Normal breath sounds.   Abdominal:     General: There is no distension.     Palpations: Abdomen is soft. There  is no mass.  Musculoskeletal:        General: No swelling.  Skin:    General: Skin is warm.  Neurological:     General: No focal deficit present.     Mental Status: He is alert and oriented to person, place, and time.  Psychiatric:        Mood and Affect: Mood normal.        Behavior: Behavior normal.    Muscle power is 4 out of 5 in all extremities.  LABORATORY DATA:  I have reviewed the labs as listed.  CBC    Component Value Date/Time   WBC 8.0 11/03/2018 2103   RBC 5.16 11/03/2018 2103   HGB 14.4 11/03/2018 2103   HGB 13.5 05/02/2016 0852   HCT 45.3 11/03/2018 2103   HCT 41.0 05/02/2016 0852   PLT 265 11/03/2018 2103   PLT 436 (H) 05/02/2016 0852   MCV 87.8 11/03/2018 2103   MCV 85.7 05/02/2016 0852   MCH 27.9 11/03/2018 2103   MCHC 31.8 11/03/2018 2103   RDW 14.2 11/03/2018 2103   RDW 13.9 05/02/2016 0852   LYMPHSABS 4.1 (H) 10/20/2018 1509   LYMPHSABS 3.4 (H) 05/02/2016 0852   MONOABS 0.9 10/20/2018 1509   MONOABS 1.2 (H) 05/02/2016 0852   EOSABS 0.5 10/20/2018 1509   EOSABS 1.6 (H) 05/02/2016 0852   BASOSABS 0.1 10/20/2018 1509   BASOSABS 0.2 (H) 05/02/2016 0852   CMP Latest Ref Rng & Units 11/16/2018 11/03/2018 10/20/2018  Glucose 70 - 99 mg/dL - 164(H) 80  BUN 8 - 23 mg/dL '19 16 15  '$ Creatinine 0.61 - 1.24 mg/dL 1.27(H) 1.15 1.36(H)  Sodium 135 - 145 mmol/L - 139 140  Potassium 3.5 - 5.1 mmol/L - 4.1 3.6  Chloride 98 - 111 mmol/L - 106 103  CO2 22 - 32 mmol/L - 24 24  Calcium 8.9 - 10.3 mg/dL - 8.4(L) 8.8(L)  Total Protein 6.5 - 8.1 g/dL - 6.5 7.1  Total Bilirubin 0.3 - 1.2 mg/dL - 0.6 0.3  Alkaline Phos 38 - 126 U/L - 70 94  AST 15 - 41 U/L - 14(L) 15  ALT 0 - 44 U/L - 20 17       DIAGNOSTIC IMAGING:  I have independently reviewed the scans and discussed with the patient.     ASSESSMENT & PLAN:   Primary cancer of right upper lobe of lung ([place]) 1.   Stage IV adenocarcinoma of the lung, PDL 150%, other actionable mutations negative: - Diagnosed with metastatic disease on the right upper lobe, mediastinal and hilar adenopathy, descending mesocolon, left upper buttock soft tissue, left upper lobe, right subscapularis muscle, left adrenal gland and right breast subcutaneous tissue. -Keytruda from 05/17/2016 through 09/24/2018 with progression. - CT scan on 09/21/2018 showed slight progression in the right upper lobe. -He was seen by Dr. Julien Nordmann for second opinion.  He was recommended to have chemoimmunotherapy for 4 cycles with carboplatin pemetrexed and Keytruda followed by pemetrexed and Keytruda maintenance as the progression was minimal. - In the interim he developed brain metastasis. - He is now back home upon completion of radiation therapy.  He is accompanied by his daughter.  He is using a walker for ambulation.  He is able to do his daily activities at home.  He wants to get back on chemotherapy. - We talked about the combination regimen with carboplatin, pemetrexed and pembrolizumab.  We talked about side effects in detail.  He will receive vitamin B12 injection today.  He was told to take folic acid tablets daily. - We will tentatively plan to start his chemotherapy next week. - As his last scans were 2 months old, I have recommended a baseline CT of the chest.  He had a CT of the abdomen and pelvis on 11/03/2018.  Adrenal nodule on the left side was stable.  2.  Brain diastases: -Right parietal lobe 16.6 x 13.5 x 15.5 lesion on MRI dated 11/12/2018 with significant surrounding edema and mass-effect. - SRS to the brain lesion on 11/24/2018. -His slurring of speech has improved. -He is on dexamethasone taper of 2 mg daily.  He is reporting right-sided headache since the taper was started.  I have told him to increase it to 2 mg twice daily.  Total time spent is 40 minutes with more than 50% of time spent face-to-face discussing treatment plan,  counseling and coordination of care.    Orders placed this encounter:  Orders Placed This Encounter  Procedures  . CT Chest W Contrast      Derek Jack, MD [place] [phone]

## 2018-12-11 NOTE — Patient Instructions (Addendum)
[facility] Chemotherapy Teaching    You are diagnosed with metastatic (Stage IV) lung cancer.  You will be treated every 21 days with a combination of chemotherapy and immunotherapy drugs.  These are - carboplatin, pemetrexed (Alimta), and pembrolizumab (Keytruda).  The intent of treatment is palliative, meaning it's purpose is to help control your cancer to keep it from growing or spreading further, and to help alleviate any symptoms you may be having that are caused by your cancer.  You will see the doctor regularly throughout treatment.  We monitor your lab work prior to every treatment. The doctor monitors your response to treatment by the way you are feeling, your blood work, and scans periodically.  There will be wait times while you are here for treatment.  It will take about 30 minutes to 1 hour for your lab work to result. Then there will be wait times while pharmacy mixes your medications.   You must take folic acid every day by mouth beginning 7 days before your first dose of alimta.  You must keep taking folic acid every day during the time you are being treated with alimta, and for every day for 21 days after you receive your last alimta dose.    You will get B12 injections while you are on alimta.  The first one about 1 week prior to starting treatment and then about every 9 weeks during treatment.   You will receive the following pre-medications prior to receiving chemotherapy:   Medications you will receive in the clinic prior to your chemotherapy medications:  Aloxi:  ALOXI is used in adults to help prevent nausea and vomiting that happens with chemotherapy drugs.  Aloxi is a long acting medication, and will remain in your system for about two days.   Emend:  This is an anti-nausea medication that is used with Aloxi to help prevent nausea and vomiting caused by chemotherapy.  Dexamethasone:  This is a steroid given prior to chemotherapy to help prevent allergic  reactions; it may also help prevent and control nausea and diarrhea.    Carboplatin (Generic Name) Other Names: Paraplatin, CBDCA  About This Drug  Carboplatin is a drug used to treat cancer. This drug is given in the vein (IV).  Takes 30 minutes to infuse.   Possible Side Effects (More Common)  . Nausea and throwing up (vomiting). These symptoms may happen within a few hours after your treatment and may last up to 24 hours. Medicines are available to stop or lessen these side effects. . Bone marrow depression. This is a decrease in the number of white blood cells, red blood cells, and platelets. This may raise your risk of infection, make you tired and weak (fatigue), and raise your risk of bleeding. . Soreness of the mouth and throat. You may have red areas, white patches, or sores that hurt. . This drug may affect how your kidneys work. Your kidney function will be checked as needed. . Electrolyte changes. Your blood will be checked for electrolyte changes as needed.  Possible Side Effects (Less Common)  . Hair loss. Some patients lose their hair on the scalp and body. You may notice your hair thinning seven to 14 days after getting this drug. . Effects on the nerves are called peripheral neuropathy. You may feel numbness, tingling, or pain in your hands and feet. It may be hard for you to button your clothes, open jars, or walk as usual. The effect on the nerves may  get worse with more doses of the drug. These effects get better in some people after the drug is stopped but it does not get better in all people. . Loose bowel movements (diarrhea) that may last for several days . Decreased hearing or ringing in the ears . Changes in the way food and drinks taste . Changes in liver function. Your liver function will be checked as needed  Allergic Reactions  Serious allergic reactions including anaphylaxis are rare. While you are getting this drug in your vein (IV), tell your nurse right  away if you have any of these symptoms of an allergic reaction: . Trouble catching your breath . Feeling like your tongue or throat are swelling . Feeling your heart beat quickly or in a not normal way (palpitations) . Feeling dizzy or lightheaded . Flushing, itching, rash, and/or hives  Treating Side Effects  . Drink 6-8 cups of fluids each day unless your doctor has told you to limit your fluid intake due to some other health problem. A cup is 8 ounces of fluid. If you throw up or have loose bowel movements, you should drink more fluids so that you do not become dehydrated (lack water in the body from losing too much fluid). . Mouth care is very important. Your mouth care should consist of routine, gentle cleaning of your teeth or dentures and rinsing your mouth with a mixture of 1/2 teaspoon of salt in 8 ounces of water or  teaspoon of baking soda in 8 ounces of water. This should be done at least after each meal and at bedtime. . If you have mouth sores, avoid mouthwash that has alcohol. Avoid alcohol and smoking because they can bother your mouth and throat. . If you have numbness and tingling in your hands and feet, be careful when cooking, walking, and handling sharp objects and hot liquids. . Talk with your nurse about getting a wig before you lose your hair. Also, call the [employer] at 800-ACS-2345 to find out information about the "Look Good, Feel Better" program close to where you live. It is a free program where women getting chemotherapy can learn about wigs, turbans and scarves as well as makeup techniques and skin and nail care.  Food and Drug Interactions  There are no known interactions of carboplatin with food. This drug may interact with other medicines. Tell your doctor and pharmacist about all the medicines and dietary supplements (vitamins, minerals, herbs and others) that you are taking at this time. The safety and use of dietary supplements and alternative  diets are often not known. Using these might affect your cancer or interfere with your treatment. Until more is known, you should not use dietary supplements or alternative diets without your cancer doctor's help.  When to Call the Doctor Call your doctor or nurse right away if you have any of these symptoms: . Fever of 100.5 F (38 C) or above; chills . Bleeding or bruising that is not normal . Wheezing or trouble breathing . Nausea that stops you from eating or drinking . Throwing up more than once a day . Rash or itching . Loose bowel movements (diarrhea) more than four times a day or diarrhea with weakness or feeling lightheaded . Call your doctor or nurse as soon as possible if any of these symptoms happen: . Numbness, tingling, decreased feeling or weakness in fingers, toes, arms, or legs . Change in hearing, ringing in the ears . Blurred vision or other changes  in eyesight . Decreased urine . Yellowing of skin or eyes  Sexual Problems and Reproductive Concerns Sexual problems and reproduction concerns may happen. In both men and women, this drug may affect your ability to have children. This cannot be determined before your treatment. Talk with your doctor or nurse if you plan to have children. Ask for information on sperm or egg banking. In men, this drug may interfere with your ability to make sperm, but it should not change your ability to have sexual relations. In women, menstrual bleeding may become irregular or stop while you are getting this drug. Do not assume that you cannot become pregnant if you do not have a menstrual period. Women may go through signs of menopause (change of life) like vaginal dryness or itching. Vaginal lubricants can be used to lessen vaginal dryness, itching, and pain during sexual relations. Genetic counseling is available for you to talk about the effects of this drug therapy on future pregnancies. Also, a genetic counselor can look at the possible risk of  problems in the unborn baby due to this medicine if an exposure happens during pregnancy.   Pemetrexed (Generic Name) Other Names: ALIMTA  About This Drug  Pemetrexed is used to treat cancer. It is given in the vein (IV).  Takes 10 minutes to infuse.   Possible Side Effects (More Common)  . Bone marrow depression. This is a decrease in the number of white blood cells, red blood cells, and platelets. This may raise your risk of infection, make you tired and weak (fatigue), and raise your risk of bleeding. . Fatigue . Soreness of the mouth and throat. You may have red areas, white patches, or sores that hurt.  Possible Side Effects (less common)  . Trouble breathing or feeling short of breath . Nausea and throwing up (vomiting) . Skin rash  Treating Side Effects  . Ask your doctor or nurse about medicine that is available to help stop or lessen nausea and throwing up. . If you get a rash, do not put anything on it unless your doctor or nurse says you may. Keep the area around the rash clean and dry. . Mouth care is very important. Your mouth care should consist of routine, gentle cleaning of your teeth or dentures and rinsing your mouth with a mixture of 1/2 teaspoon of salt in 8 ounces of water or  teaspoon of baking soda in 8 ounces of water. This should be done at least after each meal and at bedtime. . If you have mouth sores, avoid mouthwash that has alcohol. Avoid alcohol and smoking because they can bother your mouth and throat.  Food and Drug Interactions  There are no known interactions of this medicine with food. Tell your doctor if you are taking ibuprofen. Pemetrexed may interact with other medicines. Tell your doctor and pharmacist about all the medicines and dietary supplements (vitamins, minerals, herbs and others) that you are taking at this time. The safety and use of dietary supplements and alternative diets are often not known. Using these might affect your cancer  or interfere with your treatment. Until more is known, you should not use dietary supplements or alternative diets without your cancer doctor's help.  When to Call the Doctor  Call your doctor or nurse right away if you have any of these symptoms: . Temperature of 100.5 F (38 C) or above . Chills . Easy bruising or bleeding . Trouble breathing . Chest pain . Nausea that stops you  from eating or drinking . Throwing up more than 3 times in a day Call your doctor or nurse as soon as possible if you have any of these symptoms: . Rash that does not go away with prescribed medicine . Nausea or vomiting that does not go away with prescribed medicine . Extreme fatigue that interferes with normal activities   Pembrolizumab [facility])  About This Drug  Pembrolizumab is used to treat cancer. It is given in the vein (IV).  This drug will take 30 minutes to infuse.  Possible Side Effects  . Tiredness . Fever . Nausea . Decreased appetite (decreased hunger) . Loose bowel movements (diarrhea) . Constipation (not able to move bowels) . Trouble breathing . Rash . Itching . Muscle and bone pain . Cough Note: Each of the side effects above was reported in 20% or greater of patients treated with pembrolizumab. Not all possible side effects are included above.  Warnings and Precautions  . This drug works with your immune system and can cause inflammation in any of your organs and tissues and can change how they work. This may put you at risk for developing serious medical problems which can very rarely be fatal. . Colitis (swelling (inflammation) in the colon) - symptoms are loose bowel movements (diarrhea) stomach cramping, and sometimes blood in the bowel movements . Changes in liver function. Your liver function will be checked as needed. . Changes in kidney function, which can very rarely be fatal. Your kidney function will be checked as needed. . Inflammation (swelling) of the lungs  which can very rarely be fatal - you may have a dry cough or trouble breathing. . This drug may affect some of your hormone glands (especially the thyroid, adrenals, pituitary and pancreas). Your hormone levels will be checked as needed. . Blood sugar levels may change and you may develop diabetes. If you already have diabetes, changes may need to be made to your diabetes medication. . Severe allergic skin reaction, which can very rarely be fatal. You may develop blisters on your skin that are filled with fluid or a severe red rash all over your body that may be painful. . Increased risk of organ rejection in patients who have received donor organs . Increased risk of complications in patients who will undergo a stem cell transplant after receiving pembrolizumab. . While you are getting this drug in your vein (IV), you may have a reaction to the drug. Your nurse will check you closely for these signs: fever or shaking chills, flushing, facial swelling, feeling dizzy, headache, trouble breathing, rash, itching, chest tightness, or chest pain. These reactions may occur after your infusion. If this happens, call 911 for emergency care.  Important Information  . This drug may be present in the saliva, tears, sweat, urine, stool, vomit, semen, and vaginal secretions. Talk to your doctor and/or your nurse about the necessary precautions to take during this time.  Treating Side Effects  . Ask your doctor or nurse about medicines that are available to help stop or lessen constipation, diarrhea and/or nausea. . Drink plenty of fluids (a minimum of eight glasses per day is recommended). . If you are not able to move your bowels, check with your doctor or nurse before you use any enemas, laxatives, or suppositories . To help with nausea and vomiting, eat small, frequent meals instead of three large meals a day. Choose foods and drinks that are at room temperature. Ask your nurse or doctor about other helpful  tips and medicine that is available to help or stop lessen these symptoms. . If you get diarrhea, eat low-fiber foods that are high in protein and calories and avoid foods that can irritate your digestive tracts or lead to cramping. Ask your nurse or doctor about medicine that can lessen or stop your diarrhea. . Manage tiredness by pacing your activities for the day. Be sure to include periods of rest between energy-draining activities . Keeping your pain under control is important to your wellbeing. Please tell your doctor or nurse if you are experiencing pain. . If you have diabetes, keep good control of your blood sugar level. Tell your nurse or your doctor if your glucose levels are higher or lower than normal . If you get a rash do not put anything on it unless your doctor or nurse says you may. Keep the area around the rash clean and dry. Ask your doctor for medicine if your rash bothers you. . Infusion reactions may happen for 24 hours after your infusion. If this happens, call 911 for emergency care.  Food and Drug Interactions  . There are no known interactions of pembrolizumab with food. . There are no known interactions of pembrolizumab with other medications. . Tell your doctor and pharmacist about all the medicines and dietary supplements (vitamins, minerals, herbs and others) that you are taking at this time. The safety and use of dietary supplements and alternative agents are often not known. Using these might affect your cancer or interfere with your treatment. Until more is known, you should not use dietary supplements or alternative agents without your cancer doctor's help.   When to Call the Doctor  Call your doctor or nurse if you have any of the following symptoms and/or any new or unusual symptoms: . Fever of 100.5 F (38 C) or higher . Chills . Wheezing or trouble breathing . Rash or itching . Feeling dizzy or lightheaded . Loose bowel movements (diarrhea) more than 4  times a day or diarrhea with weakness or lightheadedness . Nausea that stops you from eating or drinking, and/or that is not relieved by prescribed medicines . Lasting loss of appetite or rapid weight loss of five pounds in a week . Fatigue that interferes with your daily activities . No bowel movement for 3 days or you feel uncomfortable . Extreme weakness that interferes with normal activities . Bad abdominal pain, especially in upper right area . Decreased urine . Unusual thirst or passing urine often . Rash that is not relieved by prescribed medicines . Flu-like symptoms: fever, headache, muscle and joint aches, and fatigue (low energy, feeling weak) . Signs of liver problems: dark urine, pale bowel movements, bad stomach pain, feeling very tired and weak, unusual itching, or yellowing of the eyes or skin . Signs of infusion reactions such as fever or shaking chills, flushing, facial swelling, feeling dizzy, headache, trouble breathing, rash, itching, chest tightness, or chest pain. . If you think you are pregnant  SELF CARE ACTIVITIES WHILE ON CHEMOTHERAPY:  Hydration Increase your fluid intake 48 hours prior to treatment and drink at least 8 to 12 cups (64 ounces) of water/decaffeinated beverages per day after treatment. You can still have your cup of coffee or soda but these beverages do not count as part of your 8 to 12 cups that you need to drink daily. No alcohol intake.  Medications Continue taking your normal prescription medication as prescribed.  If you start any new herbal or new supplements please  let us know first to make sure it is safe.  Mouth Care Have teeth cleaned professionally before starting treatment. Keep dentures and partial plates clean. Use soft toothbrush and do not use mouthwashes that contain alcohol. Biotene is a good mouthwash that is available at most pharmacies or may be ordered by calling [phone]. Use warm salt water gargles (1 teaspoon salt per 1  quart warm water) before and after meals and at bedtime. If you need dental work, please let the doctor know before you go for your appointment so that we can coordinate the best possible time for you in regards to your chemo regimen. You need to also let your dentist know that you are actively taking chemo. We may need to do labs prior to your dental appointment.  Skin Care Always use sunscreen that has not expired and with SPF (Sun Protection Factor) of 50 or higher. Wear hats to protect your head from the sun. Remember to use sunscreen on your hands, ears, face, & feet.  Use good moisturizing lotions such as udder cream, eucerin, or even Vaseline. Some chemotherapies can cause dry skin, color changes in your skin and nails.    . Avoid long, hot showers or baths. . Use gentle, fragrance-free soaps and laundry detergent. . Use moisturizers, preferably creams or ointments rather than lotions because the thicker consistency is better at preventing skin dehydration. Apply the cream or ointment within 15 minutes of showering. Reapply moisturizer at night, and moisturize your hands every time after you wash them.  Hair Loss (if your doctor says your hair will fall out)  . If your doctor says that your hair is likely to fall out, decide before you begin chemo whether you want to wear a wig. You may want to shop before treatment to match your hair color. . Hats, turbans, and scarves can also camouflage hair loss, although some people prefer to leave their heads uncovered. If you go bare-headed outdoors, be sure to use sunscreen on your scalp. . Cut your hair short. It eases the inconvenience of shedding lots of hair, but it also can reduce the emotional impact of watching your hair fall out. . Don't perm or color your hair during chemotherapy. Those chemical treatments are already damaging to hair and can enhance hair loss. Once your chemo treatments are done and your hair has grown back, it's OK to resume  dyeing or perming hair.  With chemotherapy, hair loss is almost always temporary. But when it grows back, it may be a different color or texture. In older adults who still had hair color before chemotherapy, the new growth may be completely gray.  Often, new hair is very fine and soft.  Infection Prevention Please wash your hands for at least 30 seconds using warm soapy water. Handwashing is the #1 way to prevent the spread of germs. Stay away from sick people or people who are getting over a cold. If you develop respiratory systems such as green/yellow mucus production or productive cough or persistent cough let us know and we will see if you need an antibiotic. It is a good idea to keep a pair of gloves on when going into grocery stores/[employer] to decrease your risk of coming into contact with germs on the carts, etc. Carry alcohol hand gel with you at all times and use it frequently if out in public. If your temperature reaches 100.5 or higher please call the clinic and let us know.  If it is after  hours or on the weekend please go to the ER if your temperature is over 100.5.  Please have your own personal thermometer at home to use.    Sex and bodily fluids If you are going to have sex, a condom must be used to protect the person that isn't taking chemotherapy. Chemo can decrease your libido (sex drive). For a few days after chemotherapy, chemotherapy can be excreted through your bodily fluids.  When using the toilet please close the lid and flush the toilet twice.  Do this for a few day after you have had chemotherapy.   Effects of chemotherapy on your sex life Some changes are simple and won't last long. They won't affect your sex life permanently.  Sometimes you may feel: . too tired . not strong enough to be very active . sick or sore  . not in the mood . anxious or low Your anxiety might not seem related to sex. For example, you may be worried about the cancer and how your treatment is  going. Or you may be worried about money, or about how you family are coping with your illness. These things can cause stress, which can affect your interest in sex. It's important to talk to your partner about how you feel. Remember - the changes to your sex life don't usually last long. There's usually no medical reason to stop having sex during chemo. The drugs won't have any long term physical effects on your performance or enjoyment of sex. Cancer can't be passed on to your partner during sex  Contraception It's important to use reliable contraception during treatment. Avoid getting pregnant while you or your partner are having chemotherapy. This is because the drugs may harm the baby. Sometimes chemotherapy drugs can leave a man or woman infertile.  This means you would not be able to have children in the future. You might want to talk to someone about permanent infertility. It can be very difficult to learn that you may no longer be able to have children. Some people find counselling helpful. There might be ways to preserve your fertility, although this is easier for men than for women. You may want to speak to a fertility expert. You can talk about sperm banking or harvesting your eggs. You can also ask about other fertility options, such as donor eggs. If you have or have had breast cancer, your doctor might advise you not to take the contraceptive pill. This is because the hormones in it might affect the cancer.  It is not known for sure whether or not chemotherapy drugs can be passed on through semen or secretions from the vagina. Because of this some doctors advise people to use a barrier method if you have sex during treatment. This applies to vaginal, anal or oral sex. Generally, doctors advise a barrier method only for the time you are actually having the treatment and for about a week after your treatment. Advice like this can be worrying, but this does not mean that you have to avoid being  intimate with your partner. You can still have close contact with your partner and continue to enjoy sex.  Animals If you have cats or birds we just ask that you not change the litter or change the cage.  Please have someone else do this for you while you are on chemotherapy.   Food Safety During and After Cancer Treatment Food safety is important for people both during and after cancer treatment. Cancer and cancer  treatments, such as chemotherapy, radiation therapy, and stem cell/bone marrow transplantation, often weaken the immune system. This makes it harder for your body to protect itself from foodborne illness, also called food poisoning. Foodborne illness is caused by eating food that contains harmful bacteria, parasites, or viruses.  Foods to avoid Some foods have a higher risk of becoming tainted with bacteria. These include: Marland Kitchen Unwashed fresh fruit and vegetables, especially leafy vegetables that can hide dirt and other contaminants . Raw sprouts, such as alfalfa sprouts . Raw or undercooked beef, especially ground beef, or other raw or undercooked meat and poultry . Fatty, fried, or spicy foods immediately before or after treatment.  These can sit heavy on your stomach and make you feel nauseous. . Raw or undercooked shellfish, such as oysters. . Sushi and sashimi, which often contain raw fish.  . Unpasteurized beverages, such as unpasteurized fruit juices, raw milk, raw yogurt, or cider . Undercooked eggs, such as soft boiled, over easy, and poached; raw, unpasteurized eggs; or foods made with raw egg, such as homemade raw cookie dough and homemade mayonnaise  Simple steps for food safety  Shop smart. . Do not buy food stored or displayed in an unclean area. . Do not buy bruised or damaged fruits or vegetables. . Do not buy cans that have cracks, dents, or bulges. . Pick up foods that can spoil at the end of your shopping trip and store them in a cooler on the way home.  Prepare  and clean up foods carefully. . Rinse all fresh fruits and vegetables under running water, and dry them with a clean towel or paper towel. . Clean the top of cans before opening them. . After preparing food, wash your hands for 20 seconds with hot water and soap. Pay special attention to areas between fingers and under nails. . Clean your utensils and dishes with hot water and soap. Marland Kitchen Disinfect your kitchen and cutting boards using 1 teaspoon of liquid, unscented bleach mixed into 1 quart of water.    Dispose of old food. . Eat canned and packaged food before its expiration date (the "use by" or "best before" date). . Consume refrigerated leftovers within 3 to 4 days. After that time, throw out the food. Even if the food does not smell or look spoiled, it still may be unsafe. Some bacteria, such as Listeria, can grow even on foods stored in the refrigerator if they are kept for too long.  Take precautions when eating out. . At restaurants, avoid buffets and salad bars where food sits out for a long time and comes in contact with many people. Food can become contaminated when someone with a virus, often a norovirus, or another "bug" handles it. . Put any leftover food in a "to-go" container yourself, rather than having the server do it. And, refrigerate leftovers as soon as you get home. . Choose restaurants that are clean and that are willing to prepare your food as you order it cooked.   MEDICATIONS:  Compazine/Prochlorperazine 10mg  tablet. Take 1 tablet every 6 hours as needed for nausea/vomiting. (This can make you sleepy)   EMLA cream. Apply a quarter size amount to port site 1 hour prior to chemo. Do not rub in. Cover with plastic wrap.   Over-the-Counter Meds:  Colace - 100 mg capsules - take 2 capsules daily.  If this doesn't  help then you can increase to 2 capsules twice daily.  Call us if this does not help your bowels move.   Imodium 2mg  capsule. Take 2 capsules after the 1st loose stool and then 1 capsule every 2 hours until you go a total of 12 hours without having a loose stool. Call the [facility] if loose stools continue. If diarrhea occurs at bedtime, take 2 capsules at bedtime. Then take 2 capsules every 4 hours until morning. Call [facility].    Diarrhea Sheet   If you are having loose stools/diarrhea, please purchase Imodium and begin taking as outlined:  At the first sign of poorly formed or loose stools you should begin taking Imodium (loperamide) 2 mg capsules.  Take two tablets (4mg ) followed by one tablet (2mg ) every 2 hours - DO NOT EXCEED 8 tablets in 24 hours.  If it is bedtime and you are having loose stools, take 2 tablets at bedtime, then 2 tablets every 4 hours until morning.   Always call the [facility] if you are having loose stools/diarrhea that you can't get under control.  Loose stools/diarrhea leads to dehydration (loss of water) in your body.  We have other options of trying to get the loose stools/diarrhea to stop but you must let us know!   Constipation Sheet  Colace - 100 mg capsules - take 2 capsules daily.  If this doesn't help then you can increase to 2 capsules twice daily.  Please call if the above does not work for you.   Do not go more than 2 days without a bowel movement.  It is very important that you do not become constipated.  It will make you feel sick to your stomach (nausea) and can cause abdominal pain and vomiting.   Nausea Sheet   Compazine/Prochlorperazine 10mg  tablet. Take 1 tablet every 6 hours as needed for nausea/vomiting. (This can make you sleepy)  If you are having persistent nausea (nausea that does not stop) please call the [facility] and let us know the amount of nausea that you are experiencing.  If you begin to vomit, you need to call  the [facility] and if it is the weekend and you have vomited more than one time and can't get it to stop-go to the Emergency Room.  Persistent nausea/vomiting can lead to dehydration (loss of fluid in your body) and will make you feel terrible.   Ice chips, sips of clear liquids, foods that are @ room temperature, crackers, and toast tend to be better tolerated.   SYMPTOMS TO REPORT AS SOON AS POSSIBLE AFTER TREATMENT:   FEVER GREATER THAN 100.5 F  CHILLS WITH OR WITHOUT FEVER  NAUSEA AND VOMITING THAT IS NOT CONTROLLED WITH YOUR NAUSEA MEDICATION  UNUSUAL SHORTNESS OF BREATH  UNUSUAL BRUISING OR BLEEDING  TENDERNESS IN MOUTH AND THROAT WITH OR WITHOUT PRESENCE OF ULCERS  URINARY PROBLEMS  BOWEL PROBLEMS  UNUSUAL RASH      Wear comfortable clothing and clothing appropriate for easy access to any Portacath or PICC line. Let us know if there is anything that we can do to make your  therapy better!    What to do if you need assistance after hours or on the weekends: CALL [phone].  HOLD on the line, do not hang up.  You will hear multiple messages but at the end you will be connected with a nurse triage line.  They will contact the doctor if necessary.  Most of the time they will be able to assist you.  Do not call the hospital operator.      I have been informed and understand all of the instructions given to me and have received a copy. I have been instructed to call the clinic [phone] or my family physician as soon as possible for continued medical care, if indicated. I do not have any more questions at this time but understand that I may call the [facility] or the Patient Navigator at [phone] during office hours should I have questions or need assistance in obtaining follow-up care.

## 2018-12-11 NOTE — Progress Notes (Signed)
Chemotherapy/immunotherapy education packet given and discussed with pt and family in detail.  Discussed diagnosis and staging, and tx regimen.  Reviewed chemotherapy/immunotherapy medications and side effects, as well as pre-medications.  Instructed on how to manage side effects at home, and when to call the clinic.  Importance of fever/chills discussed with pt and family. Discussed precautions to implement at home after receiving tx, as well as self care strategies. Phone numbers provided for clinic during regular working hours, also how to reach the clinic after hours and on weekends. Pt and family provided the opportunity to ask questions - all questions answered to pt's and family satisfaction.

## 2018-12-15 ENCOUNTER — Ambulatory Visit (HOSPITAL_COMMUNITY): Payer: Medicare Other | Admitting: Hematology

## 2018-12-15 ENCOUNTER — Other Ambulatory Visit (HOSPITAL_COMMUNITY): Payer: Medicare Other

## 2018-12-15 ENCOUNTER — Inpatient Hospital Stay (HOSPITAL_COMMUNITY): Payer: Medicare Other | Admitting: General Practice

## 2018-12-15 ENCOUNTER — Inpatient Hospital Stay (HOSPITAL_COMMUNITY): Payer: Medicare Other

## 2018-12-15 ENCOUNTER — Other Ambulatory Visit: Payer: Self-pay

## 2018-12-15 ENCOUNTER — Encounter: Payer: Self-pay | Admitting: General Practice

## 2018-12-15 DIAGNOSIS — C3411 Malignant neoplasm of upper lobe, right bronchus or lung: Secondary | ICD-10-CM

## 2018-12-15 NOTE — Progress Notes (Signed)
[facility] Initial Psychosocial Assessment Clinical Social Work  Clinical Social Work contacted by phone to assess psychosocial, emotional, mental health, and spiritual needs of the patient. Spoke w patient and daughter Costella Hatcher) by phone.  Barriers to care/review of distress screen:  - Transportation:  Do you anticipate any problems getting to appointments?  Do you have someone who can help run errands for you if you need it?  "its rough", daughter works in [place] area.  Has been staying w brother in [place] recently.  Has friends who drive him to appointments.  Can refer to [place] for gas cards.  Wishes he could drive himself.  Daughter or friends will drive for the moment.   - Help at home:  What is your living situation (alone, family, other)?  If you are physically unable to care for yourself, who would you call on to help you?  "This is the first week he has been back at his own house since treatment."  Can dress/feed but daughter does not feel confident w him being on the road and driving.   - Support system:  What does your support system look like?  Who would you call on if you needed some kind of practical help?  What if you needed someone to talk to for emotional support?  Daughter has people coming to check on him.  Has home health speech and RN - [facility].  Will give him "a couple of treatments, then another PET scan, if no changes they will stop his treatment and he will transition to palliative care."   - Finances:  Are you concerned about finances (I know, we may not want to go there.Marland Kitchen).  Considering returning to work?  If not, applying for disability?  Retired.  Over income for Medicaid.    What is your understanding of where you are with your cancer? Its cause?  Your treatment plan and what happens next?  "they are giving me chemo at [facility], Dr Raliegh Ip is giving me Beryle Flock but it was too expensive."  Had second opinion at [facility].  Recommended radiation.  "Its  been two months since I had anything for my cancer."  Will have infusion today at [facility] and phone visit with [place] Dr Lisbeth Renshaw in August.  No quite sure what will happen next, "Donnald Garre been uncertain about the plan ever since I went back to [facility] for the second opinion."  Wants all physicians "on the same page."  Got letter from [facility], stated "it wasn't covered, they said I should have been taken off the [employer] well before the virus came out."  Beryle Flock has cost patient significant amount - "every time I call [employer], they say 'they are filing it wrong.'"  Patient only knows what he is being asked to pay, does not know how it is being billed.  Wants someone to look at billing issues re Keytruda.  "It was doing well, but they quit giving it to me because it cost too much for them."  Brother passed away last year, "had the same insurance I have" and "they covered his bill at [facility]."  Daughter understands that patient's situation is dire, radiation was palliative in nature, Beryle Flock is not working at this point.  "He is not willing to acknowledge his situation.  "I am trying to let him stay in his house as long as possible.  When he cant take care of himself, he will either have to come to my house or go somewhere else."  Trying to let him live independently as long as possible.    If Distress Screen is positive for depression, insert PHQ NA  If Distress Screen is positive for anxiety, insert GAD 7 NA  What are your worries for the future as you begin treatment for cancer?  Cost of Keytruda and continued ability to pay for it.  Financial distress is "a big problem" for everyone.  "Worried that oncologist does have any hope."  Feels like "the are giving up on me."  What are your hopes and priorities during your treatment? What is important to you? What are your goals for your care?  "it would go back like it was", go into remission.  "I dont want to go nowhere, I want to keep on fighting."  Not giving up.     What are you willing to sacrifice during your treatment?  Hope that he will be cured.  What are you NOT willing to sacrifice during your treatment?  "I want to be mobile, I havent driven a car since they gave me the radiation."  Doesn't feel strong enough to drive, "when they gave me the radiation to my head, I didn't feel confident enough to drive."  Has left him dependent on others for transport.  Used to be able to drive himself to/from treatments.     CSW Summary:  Patient and family psychosocial functioning including strengths, limitations, and coping skills:  Patient does not have a realistic understanding of his prognosis, doesn't understand why Beryle Flock has been withdrawn, thinks it is because of cost.  "Dads at the end, and he doesn't want to admit it."  Daughter is now trying to "make him as comfortable as he can be, in his own house, as independent as possible."    Identifications of barriers to care:  Patient expectations.  Daughter cannot be there daily - wishes there was "someone who could look in on him daily to make sure he is taking his medications and is OK."  Daughter wants [facility] staff to monitor patient's ability to take care of himself at home and refer to [facility] if needed.    Availability of community resources:  Has home health/[facility].  Wants any community resources available to assist w patient at home.  Will refer to [facility] and [facility].    Clinical Social Worker follow up needed: Yes.  will call daughter in 3 weeks to assess.  Edwyna Shell, LCSW Clinical Social Worker Phone:  [phone]

## 2018-12-15 NOTE — Progress Notes (Signed)
[facility] CSW Progress Notes  Per daughter patient had "[facility]", a service of his [facility] program, which sent various people to his home to check on him.  This was frustrating and confusing to patient, services were terminated at daughter's request.  Edwyna Shell, LCSW Clinical Social Worker Phone:  [phone]

## 2018-12-16 ENCOUNTER — Inpatient Hospital Stay (HOSPITAL_COMMUNITY): Payer: Medicare Other

## 2018-12-16 ENCOUNTER — Encounter (HOSPITAL_COMMUNITY): Payer: Self-pay | Admitting: Hematology

## 2018-12-16 ENCOUNTER — Inpatient Hospital Stay (HOSPITAL_BASED_OUTPATIENT_CLINIC_OR_DEPARTMENT_OTHER): Payer: Medicare Other | Admitting: Hematology

## 2018-12-16 VITALS — BP 110/59 | HR 88 | Temp 97.6°F | Resp 18

## 2018-12-16 DIAGNOSIS — C3411 Malignant neoplasm of upper lobe, right bronchus or lung: Secondary | ICD-10-CM | POA: Diagnosis not present

## 2018-12-16 DIAGNOSIS — C7931 Secondary malignant neoplasm of brain: Secondary | ICD-10-CM | POA: Diagnosis not present

## 2018-12-16 DIAGNOSIS — Z5111 Encounter for antineoplastic chemotherapy: Secondary | ICD-10-CM | POA: Diagnosis not present

## 2018-12-16 DIAGNOSIS — Z5112 Encounter for antineoplastic immunotherapy: Secondary | ICD-10-CM

## 2018-12-16 DIAGNOSIS — Z79899 Other long term (current) drug therapy: Secondary | ICD-10-CM | POA: Diagnosis not present

## 2018-12-16 LAB — COMPREHENSIVE METABOLIC PANEL
ALT: 23 U/L (ref 0–44)
AST: 17 U/L (ref 15–41)
Albumin: 3.3 g/dL — ABNORMAL LOW (ref 3.5–5.0)
Alkaline Phosphatase: 55 U/L (ref 38–126)
Anion gap: 11 (ref 5–15)
BUN: 13 mg/dL (ref 8–23)
CO2: 19 mmol/L — ABNORMAL LOW (ref 22–32)
Calcium: 8.6 mg/dL — ABNORMAL LOW (ref 8.9–10.3)
Chloride: 104 mmol/L (ref 98–111)
Creatinine, Ser: 1.17 mg/dL (ref 0.61–1.24)
GFR calc Af Amer: >60 mL/min (ref >60)
GFR calc non Af Amer: >60 mL/min (ref >60)
Glucose, Bld: 125 mg/dL — ABNORMAL HIGH (ref 70–99)
Potassium: 4.3 mmol/L (ref 3.5–5.1)
Sodium: 134 mmol/L — ABNORMAL LOW (ref 135–145)
Total Bilirubin: 1 mg/dL (ref 0.3–1.2)
Total Protein: 7 g/dL (ref 6.5–8.1)

## 2018-12-16 LAB — CBC WITH DIFFERENTIAL/PLATELET
Abs Immature Granulocytes: 0.06 10*3/uL (ref 0.00–0.07)
Basophils Absolute: 0.1 10*3/uL (ref 0.0–0.1)
Basophils Relative: 1 %
Eosinophils Absolute: 0.1 10*3/uL (ref 0.0–0.5)
Eosinophils Relative: 1 %
HCT: 42.1 % (ref 39.0–52.0)
Hemoglobin: 13.8 g/dL (ref 13.0–17.0)
Immature Granulocytes: 1 %
Lymphocytes Relative: 12 %
Lymphs Abs: 1.3 10*3/uL (ref 0.7–4.0)
MCH: 27.9 pg (ref 26.0–34.0)
MCHC: 32.8 g/dL (ref 30.0–36.0)
MCV: 85.2 fL (ref 80.0–100.0)
Monocytes Absolute: 0.6 10*3/uL (ref 0.1–1.0)
Monocytes Relative: 6 %
Neutro Abs: 8.6 10*3/uL — ABNORMAL HIGH (ref 1.7–7.7)
Neutrophils Relative %: 79 %
Platelets: 259 10*3/uL (ref 150–400)
RBC: 4.94 MIL/uL (ref 4.22–5.81)
RDW: 15.2 % (ref 11.5–15.5)
WBC: 10.7 10*3/uL — ABNORMAL HIGH (ref 4.0–10.5)
nRBC: 0 % (ref 0.0–0.2)

## 2018-12-16 LAB — MAGNESIUM: Magnesium: 2 mg/dL (ref 1.7–2.4)

## 2018-12-16 MED ORDER — SODIUM CHLORIDE 0.9 % IV SOLN
490.0000 mg/m2 | Freq: Once | INTRAVENOUS | Status: AC
  Administered 2018-12-16: 1100 mg via INTRAVENOUS
  Filled 2018-12-16: qty 4

## 2018-12-16 MED ORDER — SODIUM CHLORIDE 0.9% FLUSH
10.0000 mL | INTRAVENOUS | Status: DC | PRN
  Administered 2018-12-16: 10 mL
  Filled 2018-12-16: qty 10

## 2018-12-16 MED ORDER — STERILE WATER FOR INJECTION IJ SOLN
INTRAMUSCULAR | Status: AC
  Filled 2018-12-16: qty 10

## 2018-12-16 MED ORDER — SODIUM CHLORIDE 0.9 % IV SOLN
Freq: Once | INTRAVENOUS | Status: AC
  Administered 2018-12-16: 09:00:00 via INTRAVENOUS

## 2018-12-16 MED ORDER — SODIUM CHLORIDE 0.9 % IV SOLN
500.0000 mg | Freq: Once | INTRAVENOUS | Status: AC
  Administered 2018-12-16: 500 mg via INTRAVENOUS
  Filled 2018-12-16: qty 50

## 2018-12-16 MED ORDER — HEPARIN SOD (PORK) LOCK FLUSH 100 UNIT/ML IV SOLN
500.0000 [IU] | Freq: Once | INTRAVENOUS | Status: AC | PRN
  Administered 2018-12-16: 500 [IU]

## 2018-12-16 MED ORDER — ALTEPLASE 2 MG IJ SOLR
2.0000 mg | Freq: Once | INTRAMUSCULAR | Status: AC
  Administered 2018-12-16: 2 mg

## 2018-12-16 MED ORDER — PALONOSETRON HCL INJECTION 0.25 MG/5ML
0.2500 mg | Freq: Once | INTRAVENOUS | Status: AC
  Administered 2018-12-16: 0.25 mg via INTRAVENOUS
  Filled 2018-12-16: qty 5

## 2018-12-16 MED ORDER — SODIUM CHLORIDE 0.9 % IV SOLN
Freq: Once | INTRAVENOUS | Status: AC
  Administered 2018-12-16: 09:00:00 via INTRAVENOUS
  Filled 2018-12-16: qty 5

## 2018-12-16 MED ORDER — ALTEPLASE 2 MG IJ SOLR
INTRAMUSCULAR | Status: AC
  Filled 2018-12-16: qty 2

## 2018-12-16 NOTE — Progress Notes (Signed)
Alteplase used for port per policy.    Small amount of blood return noted with port after alteplase.  Dr. Delton Coombes notified.  Patient seen for oncology visit and lab review with ok to treat verbal order.  Patient made aware he will not be receiving Keytruda today and will return for neulasta injection instead of Onpro.  Understanding verbalized by the patient.    Patient given new schedule for neulasta injection for tomorrow. Reviewed chemo treatments and side effects with all questions asked and answered.   Patient tolerated chemotherapy with no complaints voiced.  Port site clean and dry with no bruising or swelling noted at site.  Good blood return noted before and after administration of chemotherapy.  Band aid applied.  Patient left by wheelchair with VSS and no s/s of distress noted.

## 2018-12-16 NOTE — Progress Notes (Signed)
Port does not give blood.  Labs drawn peripherally.

## 2018-12-16 NOTE — Assessment & Plan Note (Signed)
1.  Stage IV adenocarcinoma of the lung, PDL 1-50%, other actionable mutations negative. - Keytruda from 05/17/2016 through 09/24/2018 with progression. - He was seen by Dr. Julien Nordmann for second opinion and was recommended to have chemoimmunotherapy for 4 cycles with carboplatin, pemetrexed and Keytruda followed by pemetrexed and Keytruda maintenance as the progression was minimal. - In the interim he developed brain metastasis and underwent radiation therapy. - His insurance company declined coverage for [employer]. -We talked about starting him on carboplatin and pemetrexed followed by pemetrexed maintenance.  We discussed the side effects in detail.  He is continuing folic acid daily. - He has CT scan of the chest scheduled tomorrow as his last scan was 2 months old.  This will be done as a new baseline. - I will see him back in 1 week to see how he is tolerating therapy.  We will also try to obtain free drug from the drug company.  2.  Brain metastasis: -Right parietal lobe 16.6 x 13.5 x 15.5 mm lesion on MRI dated 11/12/2018 with significant surrounding edema and mass-effect. -SRS to the brain lesion on 11/24/2018. -Slurring of speech improved.  He is taking dexamethasone 2 mg twice daily.

## 2018-12-16 NOTE — Patient Instructions (Signed)
[facility] at [facility] Discharge Instructions  You were seen today by Dr. Katragadda. He went over your recent lab results. He will see you back in 1 week for labs and follow up.   Thank you for choosing  Cancer Center at [facility] to provide your oncology and hematology care.  To afford each patient quality time with our provider, please arrive at least 15 minutes before your scheduled appointment time.   If you have a lab appointment with the Cancer Center please come in thru the  Main Entrance and check in at the main information desk  You need to re-schedule your appointment should you arrive 10 or more minutes late.  We strive to give you quality time with our providers, and arriving late affects you and other patients whose appointments are after yours.  Also, if you no show three or more times for appointments you may be dismissed from the clinic at the providers discretion.     Again, thank you for choosing [facility].  Our hope is that these requests will decrease the amount of time that you wait before being seen by our physicians.       _____________________________________________________________  Should you have questions after your visit to [facility], please contact our office at [phone] between the hours of 8:00 a.m. and 4:30 p.m.  Voicemails left after 4:00 p.m. will not be returned until the following business day.  For prescription refill requests, have your pharmacy contact our office and allow 72 hours.    Cancer Center Support Programs:   > Cancer Support Group  2nd Tuesday of the month 1pm-2pm, Journey Room    

## 2018-12-16 NOTE — Progress Notes (Signed)
Darrell Christensen, Darrell Christensen [id number]   CLINIC:  Medical Oncology/Hematology  PCP:  Celene Squibb, MD [place] [address] [phone]   REASON FOR VISIT:  Follow-up for stage IV adenocarcinoma of the right lung   BRIEF ONCOLOGIC HISTORY:  Oncology History  Primary cancer of right upper lobe of lung ([place])  05/14/2015 Procedure   Port placement by Dr. Arnoldo Morale   04/01/2016 Initial Diagnosis   Primary cancer of right upper lobe of lung ([place])   04/02/2016 Procedure   Ultrasound-guided core biopsy performed of a solid 1.5 cm soft tissue nodule in the right chest wall.   04/03/2016 Imaging   MRI brain- No acute and intracranial process or metastasis.  Moderate chronic small vessel ischemic disease.   04/04/2016 Pathology Results   Diagnosis Soft Tissue Needle Core Biopsy, Right Chest Wall SMOOTH MUSCLE NEOPLASM Microscopic Comment The differential diagnosis include low grade leiomyosarcoma. The neoplasm shows spindle cell morphology with rare mitotic figures and minimal atypia, no necrosis present. These cells stains positive for smooth muscle actin, desmin, negative for cytokeratin AE1&3, ck8/18, s100, cd117, cd 34 and cd99. This case also reviewed by Dr. [name] and agree.   04/05/2016 Procedure   CT-guided biopsy of right upper lobe lesion, with tissue specimen sent to pathology, by IR.   04/09/2016 Pathology Results   Lung, needle/core biopsy(ies), Right Upper Lobe - NON SMALL CELL CARCINOMA.   04/17/2016 Pathology Results   PDL1 High Expression.  TPS 50%.   04/24/2016 PET scan   1. Prominently hypermetabolic large right upper lobe mass with metastatic disease to the right paratracheal and right hilar nodal chains, to the descending mesocolon, to the left upper buttock subcutaneous tissues, to the left upper lobe, to the right subscapularis muscle, and possibly to the left adrenal gland and right breast  subcutaneous tissues. Appearance compatible with stage IV lung cancer. 2. There is some focal high activity in the ascending colon which is probably from peristalsis, less likely from a local colon mass. This does raise the possibility that the adjacent mesocolon tumor implant could be due to synchronous colon cancer rather than lung metastasis. 3. Other imaging findings of potential clinical significance: Coronary, aortic arch, and branch vessel atherosclerotic vascular disease. Aortoiliac atherosclerotic vascular disease. Deformity left proximal humerus and left glenohumeral joint from prior trauma. Enlarged prostate gland.   04/26/2016 Pathology Results   FoundationONE: Genomic alterations identified- KRAS X83A, CREBBP splice site [id number], LRP1B S515*, AYO45 T97*, FSF42 splice site [id number], SPTA1 splice site [id number], TP53 C135Y.  Additional findings- MS-Stable, TMB-intermediate (14 Muts/Mb).  No reportable alterations- EGFR, ALK, BRAF, MET, ERBB2, RET, ROS1.   05/17/2016 -  Chemotherapy   The patient had ONDANSETRON IVPB [facility] +/- DEXAMETHASONE, , Intravenous,  Once, 0 of 1 cycle  pembrolizumab (KEYTRUDA) 200 mg in sodium chloride 0.9 % 50 mL chemo infusion, 200 mg, Intravenous, Once, 0 of 5 cycles  for chemotherapy treatment.     08/06/2016 PET scan   IMPRESSION: 1. Overall considerable improvement. The right upper lobe mass is markedly reduced in volume and SUV. Thoracic adenopathy have resolved and the hypermetabolic lesion in the right subscapularis muscle has also resolved. 2. The left upper lobe nodule is no longer appreciably hypermetabolic, and is highly indistinct although this may be due to motion artifact. Faint in indistinct nodularity elsewhere in the lungs likewise not currently hypermetabolic. 3. There is some very faint residual low-grade activity along the subcutaneous deposit  along the upper left buttock region. Marked reduction in size and activity of  the tumor deposit adjacent to the descending colon. 4. Prior right pleural effusion has resolved. 5. Stable appearance of the adrenal glands, with low-level activity and a nodule in the left adrenal gland. 6. Stable small nodule in the right breast, low-grade metabolic activity with maximum SUV 2.2 (formerly 3.0) 7. Other imaging findings of potential clinical significance: Severe arthropathy of the left glenohumeral joint. Coronary, aortic arch, and branch vessel atherosclerotic vascular disease. Aortoiliac atherosclerotic vascular disease. Prominent prostate gland indents the bladder base.    12/16/2018 -  Chemotherapy   The patient had palonosetron (ALOXI) injection 0.25 mg, 0.25 mg, Intravenous,  Once, 1 of 4 cycles Administration: 0.25 mg (12/16/2018) pegfilgrastim (NEULASTA) injection 6 mg, 6 mg, Subcutaneous, Once, 1 of 1 cycle pegfilgrastim (NEULASTA ONPRO KIT) injection 6 mg, 6 mg, Subcutaneous, Once, 0 of 5 cycles PEMEtrexed (ALIMTA) 1,100 mg in sodium chloride 0.9 % 100 mL chemo infusion, 490 mg/m2 = 1,125 mg, Intravenous,  Once, 1 of 6 cycles Administration: 1,100 mg (12/16/2018) CARBOplatin (PARAPLATIN) 500 mg in sodium chloride 0.9 % 250 mL chemo infusion, 530 mg (110.7 % of original dose 477.5 mg), Intravenous,  Once, 1 of 4 cycles Dose modification:   (original dose 477.5 mg, Cycle 1) Administration: 500 mg (12/16/2018) fosaprepitant (EMEND) 150 mg, dexamethasone (DECADRON) 12 mg in sodium chloride 0.9 % 145 mL IVPB, , Intravenous,  Once, 1 of 4 cycles Administration:  (12/16/2018)  for chemotherapy treatment.    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  Two adjacent large masslike areas of consolidation within the right upper lobe with differential considerations including malignancy or pneumonia. Multiple enlarged right hilar and  mediastinal lymph nodes which may be reactive or metastatic in etiology.  Additional indeterminate pulmonary nodules as above. Recommend attention on follow-up.  Indeterminate 2.9 cm left adrenal nodule. This needs dedicated evaluation with pre and post contrast-enhanced CT or MRI after confirmation of pulmonary process.  Indeterminate nodule within the right breast. Recommend dedicated evaluation with mammography.  Hepatic steatosis.   04/02/2016 Procedure   Ultrasound-guided core biopsy performed of a solid 1.5 cm soft tissue nodule in the right chest wall.   04/04/2016 Pathology Results   Diagnosis Soft Tissue Needle Core Biopsy, Right Chest Wall SMOOTH MUSCLE NEOPLASM Microscopic Comment The differential diagnosis include low grade leiomyosarcoma. The neoplasm shows spindle cell morphology with rare mitotic figures and minimal atypia, no necrosis present. These cells stains positive for smooth muscle actin, desmin, negative for cytokeratin AE1&3, ck8/18, s100, cd117, cd 34 and cd99. This case also reviewed by Dr. [name] and agree.       CANCER STAGING: Cancer Staging Primary cancer of right upper lobe of lung ([place]) Staging form: Lung, AJCC 8th Edition - Clinical: Stage IVB (cT3, cN2, cM1c) - Signed by Baird Cancer, PA-C on 05/07/2016    INTERVAL HISTORY:  Mr. Stockard 73 y.o. male seen for follow-up of metastatic lung cancer.  He is accompanied by his friend today.  He is taking folic acid daily.  He is ready to start his first cycle of chemotherapy today.  Appetite and energy levels are 50%.  Back pain is reported as 4 out of 10 and is stable.  Shortness of breath on exertion is also stable.  He has on and off episodes of diarrhea which is also  stable.  Denies any cough or hemoptysis.  No fevers or chills reported.    REVIEW OF SYSTEMS:  Review of Systems  Respiratory: Positive for shortness of breath.   Gastrointestinal: Positive for diarrhea.  Skin:  Negative for itching.  All other systems reviewed and are negative.    PAST MEDICAL/SURGICAL HISTORY:  Past Medical History:  Diagnosis Date  . Adrenal mass, left ([place]) 04/01/2016  . Diabetes mellitus ([place]) 05/02/2016  . Diabetes mellitus without complication ([place])   . Hypertension   . Mass of breast, right 04/01/2016  . Mass of upper lobe of right lung 04/01/2016   2 adjacent, 5 cm masses, hilar adenopathy  . Primary cancer of right upper lobe of lung ([place]) 04/01/2016   2 adjacent, 5 cm masses, hilar adenopathy  . Pulmonary embolus ([place]) 03/31/2016  . 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 COLONOSCOPY N/A 07/28/2017   Procedure: COLONOSCOPY;  Surgeon: Rogene Houston, MD;  Location: [facility] ENDO SUITE;  Service: Endoscopy;  Laterality: N/A;  205  . PORTACATH PLACEMENT Left 05/13/2016   Procedure: INSERTION PORT-A-CATH;  Surgeon: Aviva Signs, MD;  Location: [facility] ORS;  Service: General;  Laterality: Left;     SOCIAL HISTORY:  Social History   Socioeconomic History  . Marital status: Divorced    Spouse name: Not on file  . Number of children: Not on file  . Years of education: Not on file  . Highest education level: Not on file  Occupational History  . Not on file  Social Needs  . Financial resource strain: Not on file  . Food insecurity    Worry: Not on file    Inability: Not on file  . Transportation needs    Medical: No    Non-medical: No  Tobacco Use  . Smoking status: Former Smoker    Years: 61.00    Quit date: 04/11/2014    Years since quitting: 4.6  . Smokeless tobacco: Never Used  . Tobacco comment: patient does vape smoking x 2 years  Substance and Sexual Activity  . Alcohol use: Not Currently    Comment: quit 10 years  . Drug use: No  . Sexual activity: Not on file  Lifestyle  . Physical activity    Days per week: Not on file    Minutes per session: Not on file  . Stress: Not on file  Relationships  . Social  Herbalist on phone: Not on file    Gets together: Not on file    Attends religious service: Not on file    Active member of club or organization: Not on file    Attends meetings of clubs or organizations: Not on file    Relationship status: Not on file  . Intimate partner violence    Fear of current or ex partner: Not on file    Emotionally abused: Not on file    Physically abused: Not on file    Forced sexual activity: Not on file  Other Topics Concern  . Not on file  Social History Narrative  . Not on file    FAMILY HISTORY:  Family History  Problem Relation Age of Onset  . Stroke Mother   . Heart attack Father   . Heart attack Brother     CURRENT MEDICATIONS:  Outpatient Encounter Medications as of 12/16/2018  Medication Sig  . ALPRAZolam (XANAX) 1 MG tablet Take 1 mg by mouth 3 (  three) times daily as needed for anxiety.  Marland Kitchen amLODipine (NORVASC) 2.5 MG tablet Take 2.5 mg by mouth daily.  Marland Kitchen CARBOPLATIN IV Inject into the vein every 21 ( twenty-one) days.  Marland Kitchen dexamethasone (DECADRON) 4 MG tablet Take 1 tablet (4 mg total) by mouth every 6 (six) hours. (Patient not taking: Reported on 12/10/2018)  . folic acid (FOLVITE) 1 MG tablet Take 1 tablet (1 mg total) by mouth daily. Start 5-7 days before Alimta chemotherapy. Continue until 21 days after Alimta completed.  . furosemide (LASIX) 20 MG tablet Take 2 tablets (40 mg total) by mouth daily.  Marland Kitchen GLIPIZIDE XL 5 MG 24 hr tablet Take 5 mg by mouth daily.  . hydrocortisone cream 0.5 % Apply 1 application topically 2 (two) times daily.  . hydrOXYzine (ATARAX/VISTARIL) 25 MG tablet Take 1 tablet (25 mg total) by mouth 3 (three) times daily as needed.  . lidocaine-prilocaine (EMLA) cream Apply a quarter size amount to affected area 1 hour prior to coming to chemotherapy. Cover with plastic wrap.  Marland Kitchen LORazepam (ATIVAN) 2 MG tablet 1 tab po 30 minutes prior to radiation or MRI scans. Do not take Xanax within 6 hours of taking Ativan   . metFORMIN (GLUCOPHAGE) 500 MG tablet Take 500 mg by mouth 2 (two) times daily.  . metoprolol succinate (TOPROL-XL) 25 MG 24 hr tablet TAKE ONE TABLET BY MOUTH IN THE EVENING.  Marland Kitchen nystatin (MYCOSTATIN) 100000 UNIT/ML suspension Take 5 mLs (500,000 Units total) by mouth 4 (four) times daily.  . ondansetron (ZOFRAN) 4 MG tablet Take 1 tablet (4 mg total) by mouth every 8 (eight) hours as needed for nausea or vomiting.  Marland Kitchen oxyCODONE-acetaminophen (PERCOCET) 10-325 MG tablet   . Pembrolizumab (KEYTRUDA IV) Inject into the vein.  Marland Kitchen PEMEtrexed 500 mg/m2 in sodium chloride 0.9 % 100 mL Inject into the vein every 21 ( twenty-one) days.  . potassium chloride (K-DUR,KLOR-CON) 10 MEQ tablet   . prochlorperazine (COMPAZINE) 10 MG tablet Take 1 tablet (10 mg total) by mouth every 6 (six) hours as needed (Nausea or vomiting).  . simvastatin (ZOCOR) 20 MG tablet Take 20 mg by mouth every evening.  . Skin Protectants, Misc. (EUCERIN) cream Apply topically as needed for dry skin.  Marland Kitchen spironolactone (ALDACTONE) 50 MG tablet Take 1 tablet (50 mg total) by mouth daily.  Marland Kitchen triamcinolone cream (KENALOG) 0.1 % Apply 1 application topically 2 (two) times daily.  . [EXPIRED] alteplase (CATHFLO ACTIVASE) injection 2 mg    No facility-administered encounter medications on file as of 12/16/2018.     ALLERGIES:  No Known Allergies   PHYSICAL EXAM:  ECOG Performance status: 1  Vitals:   12/16/18 0752  BP: 103/61  Pulse: 85  Resp: 18  Temp: (!) 97.1 F (36.2 C)  SpO2: 92%   Filed Weights   12/16/18 0752  Weight: 205 lb (93 kg)    Physical Exam Vitals signs reviewed.  Constitutional:      Appearance: Normal appearance.  Cardiovascular:     Rate and Rhythm: Normal rate and regular rhythm.     Heart sounds: Normal heart sounds.  Pulmonary:     Effort: Pulmonary effort is normal.     Breath sounds: Normal breath sounds.  Abdominal:     General: There is no distension.     Palpations: Abdomen is soft.  There is no mass.  Musculoskeletal:        General: No swelling.  Skin:    General: Skin is warm.  Neurological:     General: No focal deficit present.     Mental Status: He is alert and oriented to person, place, and time.  Psychiatric:        Mood and Affect: Mood normal.        Behavior: Behavior normal.    Muscle power is 4 out of 5 in all extremities.  LABORATORY DATA:  I have reviewed the labs as listed.  CBC    Component Value Date/Time   WBC 10.7 (H) 12/16/2018 0745   RBC 4.94 12/16/2018 0745   HGB 13.8 12/16/2018 0745   HGB 13.5 05/02/2016 0852   HCT 42.1 12/16/2018 0745   HCT 41.0 05/02/2016 0852   PLT 259 12/16/2018 0745   PLT 436 (H) 05/02/2016 0852   MCV 85.2 12/16/2018 0745   MCV 85.7 05/02/2016 0852   MCH 27.9 12/16/2018 0745   MCHC 32.8 12/16/2018 0745   RDW 15.2 12/16/2018 0745   RDW 13.9 05/02/2016 0852   LYMPHSABS 1.3 12/16/2018 0745   LYMPHSABS 3.4 (H) 05/02/2016 0852   MONOABS 0.6 12/16/2018 0745   MONOABS 1.2 (H) 05/02/2016 0852   EOSABS 0.1 12/16/2018 0745   EOSABS 1.6 (H) 05/02/2016 0852   BASOSABS 0.1 12/16/2018 0745   BASOSABS 0.2 (H) 05/02/2016 0852   CMP Latest Ref Rng & Units 12/16/2018 11/16/2018 11/03/2018  Glucose 70 - 99 mg/dL 125(H) - 164(H)  BUN 8 - 23 mg/dL _0 Creatinine 0.61 - 1.24 mg/dL 1.17 1.27(H) 1.15  Sodium 135 - 145 mmol/L 134(L) - 139  Potassium 3.5 - 5.1 mmol/L 4.3 - 4.1  Chloride 98 - 111 mmol/L 104 - 106  CO2 22 - 32 mmol/L 19(L) - 24  Calcium 8.9 - 10.3 mg/dL 8.6(L) - 8.4(L)  Total Protein 6.5 - 8.1 g/dL 7.0 - 6.5  Total Bilirubin 0.3 - 1.2 mg/dL 1.0 - 0.6  Alkaline Phos 38 - 126 U/L 55 - 70  AST 15 - 41 U/L 17 - 14(L)  ALT 0 - 44 U/L 23 - 20       DIAGNOSTIC IMAGING:  I have independently reviewed the scans and discussed with the patient.     ASSESSMENT & PLAN:   Primary cancer of right upper lobe of lung ([place]) 1.  Stage IV adenocarcinoma of the lung, PDL 1-50%, other actionable mutations  negative. - Keytruda from 05/17/2016 through 09/24/2018 with progression. - He was seen by Dr. Julien Nordmann for second opinion and was recommended to have chemoimmunotherapy for 4 cycles with carboplatin, pemetrexed and Keytruda followed by pemetrexed and Keytruda maintenance as the progression was minimal. - In the interim he developed brain metastasis and underwent radiation therapy. - His insurance company declined coverage for [employer]. -We talked about starting him on carboplatin and pemetrexed followed by pemetrexed maintenance.  We discussed the side effects in detail.  He is continuing folic acid daily. - He has CT scan of the chest scheduled tomorrow as his last scan was 2 months old.  This will be done as a new baseline. - I will see him back in 1 week to see how he is tolerating therapy.  We will also try to obtain free drug from the drug company.  2.  Brain metastasis: -Right parietal lobe 16.6 x 13.5 x 15.5 mm lesion on MRI dated 11/12/2018 with significant surrounding edema and mass-effect. -SRS to the brain lesion on 11/24/2018. -Slurring of speech improved.  He is taking dexamethasone 2 mg twice daily.  Total time spent  is 40 minutes with more than 50% of time spent face-to-face discussing treatment plan, counseling and coordination of care.    Orders placed this encounter:  No orders of the defined types were placed in this encounter.     Derek Jack, MD [place] [phone]

## 2018-12-17 ENCOUNTER — Ambulatory Visit (HOSPITAL_COMMUNITY): Payer: Medicare Other

## 2018-12-17 ENCOUNTER — Ambulatory Visit (HOSPITAL_COMMUNITY)
Admission: RE | Admit: 2018-12-17 | Discharge: 2018-12-17 | Disposition: A | Discharge status: Home / Self Care | Payer: Medicare Other | Source: Ambulatory Visit | Attending: Hematology | Admitting: Hematology

## 2018-12-17 ENCOUNTER — Other Ambulatory Visit: Payer: Self-pay

## 2018-12-17 DIAGNOSIS — C3411 Malignant neoplasm of upper lobe, right bronchus or lung: Secondary | ICD-10-CM | POA: Diagnosis not present

## 2018-12-17 DIAGNOSIS — C7931 Secondary malignant neoplasm of brain: Secondary | ICD-10-CM | POA: Diagnosis not present

## 2018-12-17 MED ORDER — IOHEXOL 300 MG/ML  SOLN
75.0000 mL | Freq: Once | INTRAMUSCULAR | Status: AC | PRN
  Administered 2018-12-17: 75 mL via INTRAVENOUS

## 2018-12-21 DIAGNOSIS — R600 Localized edema: Secondary | ICD-10-CM | POA: Diagnosis not present

## 2018-12-21 DIAGNOSIS — I10 Essential (primary) hypertension: Secondary | ICD-10-CM | POA: Diagnosis not present

## 2018-12-21 DIAGNOSIS — E1165 Type 2 diabetes mellitus with hyperglycemia: Secondary | ICD-10-CM | POA: Diagnosis not present

## 2018-12-21 DIAGNOSIS — E782 Mixed hyperlipidemia: Secondary | ICD-10-CM | POA: Diagnosis not present

## 2018-12-21 DIAGNOSIS — E119 Type 2 diabetes mellitus without complications: Secondary | ICD-10-CM | POA: Diagnosis not present

## 2018-12-23 ENCOUNTER — Inpatient Hospital Stay (HOSPITAL_COMMUNITY): Payer: Medicare Other

## 2018-12-23 ENCOUNTER — Other Ambulatory Visit: Payer: Self-pay

## 2018-12-23 ENCOUNTER — Encounter (HOSPITAL_COMMUNITY): Payer: Self-pay | Admitting: Hematology

## 2018-12-23 ENCOUNTER — Inpatient Hospital Stay (HOSPITAL_COMMUNITY): Payer: Medicare Other | Admitting: Hematology

## 2018-12-23 DIAGNOSIS — C3411 Malignant neoplasm of upper lobe, right bronchus or lung: Secondary | ICD-10-CM

## 2018-12-23 DIAGNOSIS — Z5111 Encounter for antineoplastic chemotherapy: Secondary | ICD-10-CM | POA: Diagnosis not present

## 2018-12-23 DIAGNOSIS — C7931 Secondary malignant neoplasm of brain: Secondary | ICD-10-CM | POA: Diagnosis not present

## 2018-12-23 DIAGNOSIS — Z79899 Other long term (current) drug therapy: Secondary | ICD-10-CM | POA: Diagnosis not present

## 2018-12-23 LAB — CBC WITH DIFFERENTIAL/PLATELET
Abs Immature Granulocytes: 0.03 10*3/uL (ref 0.00–0.07)
Basophils Absolute: 0 10*3/uL (ref 0.0–0.1)
Basophils Relative: 0 %
Eosinophils Absolute: 0.1 10*3/uL (ref 0.0–0.5)
Eosinophils Relative: 2 %
HCT: 42.4 % (ref 39.0–52.0)
Hemoglobin: 13.7 g/dL (ref 13.0–17.0)
Immature Granulocytes: 1 %
Lymphocytes Relative: 47 %
Lymphs Abs: 1.4 10*3/uL (ref 0.7–4.0)
MCH: 27.8 pg (ref 26.0–34.0)
MCHC: 32.3 g/dL (ref 30.0–36.0)
MCV: 86.2 fL (ref 80.0–100.0)
Monocytes Absolute: 0.1 10*3/uL (ref 0.1–1.0)
Monocytes Relative: 2 %
Neutro Abs: 1.4 10*3/uL — ABNORMAL LOW (ref 1.7–7.7)
Neutrophils Relative %: 48 %
Platelets: 177 10*3/uL (ref 150–400)
RBC: 4.92 MIL/uL (ref 4.22–5.81)
RDW: 14.6 % (ref 11.5–15.5)
WBC: 2.9 10*3/uL — ABNORMAL LOW (ref 4.0–10.5)
nRBC: 0 % (ref 0.0–0.2)

## 2018-12-23 LAB — COMPREHENSIVE METABOLIC PANEL
ALT: 37 U/L (ref 0–44)
AST: 26 U/L (ref 15–41)
Albumin: 3.5 g/dL (ref 3.5–5.0)
Alkaline Phosphatase: 68 U/L (ref 38–126)
Anion gap: 14 (ref 5–15)
BUN: 33 mg/dL — ABNORMAL HIGH (ref 8–23)
CO2: 24 mmol/L (ref 22–32)
Calcium: 8.8 mg/dL — ABNORMAL LOW (ref 8.9–10.3)
Chloride: 102 mmol/L (ref 98–111)
Creatinine, Ser: 1.19 mg/dL (ref 0.61–1.24)
GFR calc Af Amer: >60 mL/min (ref >60)
GFR calc non Af Amer: 60 mL/min — ABNORMAL LOW (ref >60)
Glucose, Bld: 148 mg/dL — ABNORMAL HIGH (ref 70–99)
Potassium: 4.2 mmol/L (ref 3.5–5.1)
Sodium: 140 mmol/L (ref 135–145)
Total Bilirubin: 0.8 mg/dL (ref 0.3–1.2)
Total Protein: 6.9 g/dL (ref 6.5–8.1)

## 2018-12-23 LAB — MAGNESIUM: Magnesium: 1.8 mg/dL (ref 1.7–2.4)

## 2018-12-23 NOTE — Assessment & Plan Note (Signed)
1.  Stage IV adenocarcinoma of the lung, PDL 1-50%, other actionable mutations negative: - Keytruda from 05/17/2016 through 09/24/2018 with mild progression. - Cycle 1 of carboplatin and pemetrexed on 12/16/2018. -CT of the chest on 12/17/2018 shows posterior right upper lobe mass measured 4.1 x 3.2 cm, previously 5.2 x 3.9 cm.  Right paratracheal lymph node decreased from 12 mm to 9 mm.  No new areas were seen.  Right breast nodule also decreased in size. - He felt weak for couple of days after first treatment last week.  He also had diarrhea for couple of days. -He missed his Neulasta injection. -We reviewed his blood work which shows white count of 2.9 with ANC of 1400. - We will see him back in 2 weeks for his next cycle.  We are trying to get free Keytruda from drug company.  2.  Brain metastasis: -Right parietal lobe 16.6 x 13.5 x 15.5 mm lesion on MRI dated 11/12/2018 with significant surrounding edema and mass-effect. -SRS to the brain lesion on 11/24/2018. - Slurred speech has improved.

## 2018-12-23 NOTE — Progress Notes (Signed)
Darrell Christensen, [place] [address]   CLINIC:  Medical Oncology/Hematology  PCP:  Celene Squibb, MD [place] [address] [phone]   REASON FOR VISIT:  Follow-up for stage IV adenocarcinoma of the right lung   BRIEF ONCOLOGIC HISTORY:  Oncology History  Primary cancer of right upper lobe of lung ([place])  05/14/2015 Procedure   Port placement by Dr. Arnoldo Morale   04/01/2016 Initial Diagnosis   Primary cancer of right upper lobe of lung ([place])   04/02/2016 Procedure   Ultrasound-guided core biopsy performed of a solid 1.5 cm soft tissue nodule in the right chest wall.   04/03/2016 Imaging   MRI brain- No acute and intracranial process or metastasis.  Moderate chronic small vessel ischemic disease.   04/04/2016 Pathology Results   Diagnosis Soft Tissue Needle Core Biopsy, Right Chest Wall SMOOTH MUSCLE NEOPLASM Microscopic Comment The differential diagnosis include low grade leiomyosarcoma. The neoplasm shows spindle cell morphology with rare mitotic figures and minimal atypia, no necrosis present. These cells stains positive for smooth muscle actin, desmin, negative for cytokeratin AE1&3, ck8/18, s100, cd117, cd 34 and cd99. This case also reviewed by Dr. [name] and agree.   04/05/2016 Procedure   CT-guided biopsy of right upper lobe lesion, with tissue specimen sent to pathology, by IR.   04/09/2016 Pathology Results   Lung, needle/core biopsy(ies), Right Upper Lobe - NON SMALL CELL CARCINOMA.   04/17/2016 Pathology Results   PDL1 High Expression.  TPS 50%.   04/24/2016 PET scan   1. Prominently hypermetabolic large right upper lobe mass with metastatic disease to the right paratracheal and right hilar nodal chains, to the descending mesocolon, to the left upper buttock subcutaneous tissues, to the left upper lobe, to the right subscapularis muscle, and possibly to the left adrenal gland and right breast  subcutaneous tissues. Appearance compatible with stage IV lung cancer. 2. There is some focal high activity in the ascending colon which is probably from peristalsis, less likely from a local colon mass. This does raise the possibility that the adjacent mesocolon tumor implant could be due to synchronous colon cancer rather than lung metastasis. 3. Other imaging findings of potential clinical significance: Coronary, aortic arch, and branch vessel atherosclerotic vascular disease. Aortoiliac atherosclerotic vascular disease. Deformity left proximal humerus and left glenohumeral joint from prior trauma. Enlarged prostate gland.   04/26/2016 Pathology Results   FoundationONE: Genomic alterations identified- KRAS X83A, CREBBP splice site [id number], LRP1B S515*, AYO45 T97*, FSF42 splice site [id number], SPTA1 splice site [id number], TP53 C135Y.  Additional findings- MS-Stable, TMB-intermediate (14 Muts/Mb).  No reportable alterations- EGFR, ALK, BRAF, MET, ERBB2, RET, ROS1.   05/17/2016 -  Chemotherapy   The patient had ONDANSETRON IVPB [facility] +/- DEXAMETHASONE, , Intravenous,  Once, 0 of 1 cycle  pembrolizumab (KEYTRUDA) 200 mg in sodium chloride 0.9 % 50 mL chemo infusion, 200 mg, Intravenous, Once, 0 of 5 cycles  for chemotherapy treatment.     08/06/2016 PET scan   IMPRESSION: 1. Overall considerable improvement. The right upper lobe mass is markedly reduced in volume and SUV. Thoracic adenopathy have resolved and the hypermetabolic lesion in the right subscapularis muscle has also resolved. 2. The left upper lobe nodule is no longer appreciably hypermetabolic, and is highly indistinct although this may be due to motion artifact. Faint in indistinct nodularity elsewhere in the lungs likewise not currently hypermetabolic. 3. There is some very faint residual low-grade activity along the subcutaneous deposit  along the upper left buttock region. Marked reduction in size and activity of  the tumor deposit adjacent to the descending colon. 4. Prior right pleural effusion has resolved. 5. Stable appearance of the adrenal glands, with low-level activity and a nodule in the left adrenal gland. 6. Stable small nodule in the right breast, low-grade metabolic activity with maximum SUV 2.2 (formerly 3.0) 7. Other imaging findings of potential clinical significance: Severe arthropathy of the left glenohumeral joint. Coronary, aortic arch, and branch vessel atherosclerotic vascular disease. Aortoiliac atherosclerotic vascular disease. Prominent prostate gland indents the bladder base.    12/16/2018 -  Chemotherapy   The patient had palonosetron (ALOXI) injection 0.25 mg, 0.25 mg, Intravenous,  Once, 1 of 4 cycles Administration: 0.25 mg (12/16/2018) pegfilgrastim (NEULASTA) injection 6 mg, 6 mg, Subcutaneous, Once, 1 of 1 cycle pegfilgrastim (NEULASTA ONPRO KIT) injection 6 mg, 6 mg, Subcutaneous, Once, 0 of 5 cycles PEMEtrexed (ALIMTA) 1,100 mg in sodium chloride 0.9 % 100 mL chemo infusion, 490 mg/m2 = 1,125 mg, Intravenous,  Once, 1 of 6 cycles Administration: 1,100 mg (12/16/2018) CARBOplatin (PARAPLATIN) 500 mg in sodium chloride 0.9 % 250 mL chemo infusion, 530 mg (110.7 % of original dose 477.5 mg), Intravenous,  Once, 1 of 4 cycles Dose modification:   (original dose 477.5 mg, Cycle 1) Administration: 500 mg (12/16/2018) fosaprepitant (EMEND) 150 mg, dexamethasone (DECADRON) 12 mg in sodium chloride 0.9 % 145 mL IVPB, , Intravenous,  Once, 1 of 4 cycles Administration:  (12/16/2018)  for chemotherapy treatment.    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  Two adjacent large masslike areas of consolidation within the right upper lobe with differential considerations including malignancy or pneumonia. Multiple enlarged right hilar and  mediastinal lymph nodes which may be reactive or metastatic in etiology.  Additional indeterminate pulmonary nodules as above. Recommend attention on follow-up.  Indeterminate 2.9 cm left adrenal nodule. This needs dedicated evaluation with pre and post contrast-enhanced CT or MRI after confirmation of pulmonary process.  Indeterminate nodule within the right breast. Recommend dedicated evaluation with mammography.  Hepatic steatosis.   04/02/2016 Procedure   Ultrasound-guided core biopsy performed of a solid 1.5 cm soft tissue nodule in the right chest wall.   04/04/2016 Pathology Results   Diagnosis Soft Tissue Needle Core Biopsy, Right Chest Wall SMOOTH MUSCLE NEOPLASM Microscopic Comment The differential diagnosis include low grade leiomyosarcoma. The neoplasm shows spindle cell morphology with rare mitotic figures and minimal atypia, no necrosis present. These cells stains positive for smooth muscle actin, desmin, negative for cytokeratin AE1&3, ck8/18, s100, cd117, cd 34 and cd99. This case also reviewed by Dr. [name] and agree.       CANCER STAGING: Cancer Staging Primary cancer of right upper lobe of lung ([place]) Staging form: Lung, AJCC 8th Edition - Clinical: Stage IVB (cT3, cN2, cM1c) - Signed by Baird Cancer, PA-C on 05/07/2016    INTERVAL HISTORY:  Mr. Tibbitts 73 y.o. male seen for 1 week follow-up after chemotherapy.  He received first cycle of carboplatin and pemetrexed on 12/16/2018.  He forgot to come back for the injection Neulasta.  He was in bed for 1 to 2 days with weakness.  He also had diarrhea for couple of days.  After that his energy levels have improved back to his baseline.  Vague abdominal pain is also stable.  Appetite is 25%.  Energy levels  are 50%.  He is drinking 1 Ensure per day.   REVIEW OF SYSTEMS:  Review of Systems  Gastrointestinal: Positive for diarrhea.  All other systems reviewed and are negative.    PAST MEDICAL/SURGICAL  HISTORY:  Past Medical History:  Diagnosis Date  . Adrenal mass, left ([place]) 04/01/2016  . Diabetes mellitus ([employer]) 05/02/2016  . Diabetes mellitus without complication ([place])   . Hypertension   . Mass of breast, right 04/01/2016  . Mass of upper lobe of right lung 04/01/2016   2 adjacent, 5 cm masses, hilar adenopathy  . Primary cancer of right upper lobe of lung ([place]) 04/01/2016   2 adjacent, 5 cm masses, hilar adenopathy  . Pulmonary embolus ([place]) 03/31/2016  . 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 COLONOSCOPY N/A 07/28/2017   Procedure: COLONOSCOPY;  Surgeon: Rogene Houston, MD;  Location: [facility] ENDO SUITE;  Service: Endoscopy;  Laterality: N/A;  205  . PORTACATH PLACEMENT Left 05/13/2016   Procedure: INSERTION PORT-A-CATH;  Surgeon: Aviva Signs, MD;  Location: [facility] ORS;  Service: General;  Laterality: Left;     SOCIAL HISTORY:  Social History   Socioeconomic History  . Marital status: Divorced    Spouse name: Not on file  . Number of children: Not on file  . Years of education: Not on file  . Highest education level: Not on file  Occupational History  . Not on file  Social Needs  . Financial resource strain: Not on file  . Food insecurity    Worry: Not on file    Inability: Not on file  . Transportation needs    Medical: No    Non-medical: No  Tobacco Use  . Smoking status: Former Smoker    Years: 61.00    Quit date: 04/11/2014    Years since quitting: 4.7  . Smokeless tobacco: Never Used  . Tobacco comment: patient does vape smoking x 2 years  Substance and Sexual Activity  . Alcohol use: Not Currently    Comment: quit 10 years  . Drug use: No  . Sexual activity: Not on file  Lifestyle  . Physical activity    Days per week: Not on file    Minutes per session: Not on file  . Stress: Not on file  Relationships  . Social Herbalist on phone: Not on file    Gets together: Not on file    Attends religious  service: Not on file    Active member of club or organization: Not on file    Attends meetings of clubs or organizations: Not on file    Relationship status: Not on file  . Intimate partner violence    Fear of current or ex partner: Not on file    Emotionally abused: Not on file    Physically abused: Not on file    Forced sexual activity: Not on file  Other Topics Concern  . Not on file  Social History Narrative  . Not on file    FAMILY HISTORY:  Family History  Problem Relation Age of Onset  . Stroke Mother   . Heart attack Father   . Heart attack Brother     CURRENT MEDICATIONS:  Outpatient Encounter Medications as of 12/23/2018  Medication Sig  . ALPRAZolam (XANAX) 1 MG tablet Take 1 mg by mouth 3 (three) times daily as needed for anxiety.  Marland Kitchen amLODipine (NORVASC) 2.5 MG tablet Take 2.5 mg  by mouth daily.  Marland Kitchen CARBOPLATIN IV Inject into the vein every 21 ( twenty-one) days.  Marland Kitchen dexamethasone (DECADRON) 4 MG tablet Take 1 tablet (4 mg total) by mouth every 6 (six) hours. (Patient not taking: Reported on 12/10/2018)  . folic acid (FOLVITE) 1 MG tablet Take 1 tablet (1 mg total) by mouth daily. Start 5-7 days before Alimta chemotherapy. Continue until 21 days after Alimta completed.  . furosemide (LASIX) 20 MG tablet Take 2 tablets (40 mg total) by mouth daily.  Marland Kitchen GLIPIZIDE XL 5 MG 24 hr tablet Take 5 mg by mouth daily.  . hydrocortisone cream 0.5 % Apply 1 application topically 2 (two) times daily.  . hydrOXYzine (ATARAX/VISTARIL) 25 MG tablet Take 1 tablet (25 mg total) by mouth 3 (three) times daily as needed.  . lidocaine-prilocaine (EMLA) cream Apply a quarter size amount to affected area 1 hour prior to coming to chemotherapy. Cover with plastic wrap.  Marland Kitchen LORazepam (ATIVAN) 2 MG tablet 1 tab po 30 minutes prior to radiation or MRI scans. Do not take Xanax within 6 hours of taking Ativan  . metFORMIN (GLUCOPHAGE) 500 MG tablet Take 500 mg by mouth 2 (two) times daily.  . metoprolol  succinate (TOPROL-XL) 25 MG 24 hr tablet TAKE ONE TABLET BY MOUTH IN THE EVENING.  Marland Kitchen nystatin (MYCOSTATIN) 100000 UNIT/ML suspension Take 5 mLs (500,000 Units total) by mouth 4 (four) times daily.  . ondansetron (ZOFRAN) 4 MG tablet Take 1 tablet (4 mg total) by mouth every 8 (eight) hours as needed for nausea or vomiting.  Marland Kitchen oxyCODONE-acetaminophen (PERCOCET) 10-325 MG tablet   . Pembrolizumab (KEYTRUDA IV) Inject into the vein.  Marland Kitchen PEMEtrexed 500 mg/m2 in sodium chloride 0.9 % 100 mL Inject into the vein every 21 ( twenty-one) days.  . potassium chloride (K-DUR,KLOR-CON) 10 MEQ tablet   . prochlorperazine (COMPAZINE) 10 MG tablet Take 1 tablet (10 mg total) by mouth every 6 (six) hours as needed (Nausea or vomiting).  . simvastatin (ZOCOR) 20 MG tablet Take 20 mg by mouth every evening.  . Skin Protectants, Misc. (EUCERIN) cream Apply topically as needed for dry skin.  Marland Kitchen spironolactone (ALDACTONE) 50 MG tablet Take 1 tablet (50 mg total) by mouth daily.  Marland Kitchen triamcinolone cream (KENALOG) 0.1 % Apply 1 application topically 2 (two) times daily.   No facility-administered encounter medications on file as of 12/23/2018.     ALLERGIES:  No Known Allergies   PHYSICAL EXAM:  ECOG Performance status: 1  Vitals:   12/23/18 1134  BP: 137/85  Pulse: 98  Resp: 20  Temp: 97.8 F (36.6 C)  SpO2: 97%   Filed Weights   12/23/18 1135  Weight: 200 lb 14.4 oz (91.1 kg)    Physical Exam Vitals signs reviewed.  Constitutional:      Appearance: Normal appearance.  Cardiovascular:     Rate and Rhythm: Normal rate and regular rhythm.     Heart sounds: Normal heart sounds.  Pulmonary:     Effort: Pulmonary effort is normal.     Breath sounds: Normal breath sounds.  Abdominal:     General: There is no distension.     Palpations: Abdomen is soft. There is no mass.  Musculoskeletal:        General: No swelling.  Skin:    General: Skin is warm.  Neurological:     General: No focal deficit  present.     Mental Status: He is alert and oriented to person, place, and time.  Psychiatric:        Mood and Affect: Mood normal.        Behavior: Behavior normal.    Muscle power is 4 out of 5 in all extremities.  LABORATORY DATA:  I have reviewed the labs as listed.  CBC    Component Value Date/Time   WBC 2.9 (L) 12/23/2018 1119   RBC 4.92 12/23/2018 1119   HGB 13.7 12/23/2018 1119   HGB 13.5 05/02/2016 0852   HCT 42.4 12/23/2018 1119   HCT 41.0 05/02/2016 0852   PLT 177 12/23/2018 1119   PLT 436 (H) 05/02/2016 0852   MCV 86.2 12/23/2018 1119   MCV 85.7 05/02/2016 0852   MCH 27.8 12/23/2018 1119   MCHC 32.3 12/23/2018 1119   RDW 14.6 12/23/2018 1119   RDW 13.9 05/02/2016 0852   LYMPHSABS 1.4 12/23/2018 1119   LYMPHSABS 3.4 (H) 05/02/2016 0852   MONOABS 0.1 12/23/2018 1119   MONOABS 1.2 (H) 05/02/2016 0852   EOSABS 0.1 12/23/2018 1119   EOSABS 1.6 (H) 05/02/2016 0852   BASOSABS 0.0 12/23/2018 1119   BASOSABS 0.2 (H) 05/02/2016 0852   CMP Latest Ref Rng & Units 12/23/2018 12/16/2018 11/16/2018  Glucose 70 - 99 mg/dL 148(H) 125(H) -  BUN 8 - 23 mg/dL 33(H) 13 19  Creatinine 0.61 - 1.24 mg/dL 1.19 1.17 1.27(H)  Sodium 135 - 145 mmol/L 140 134(L) -  Potassium 3.5 - 5.1 mmol/L 4.2 4.3 -  Chloride 98 - 111 mmol/L 102 104 -  CO2 22 - 32 mmol/L 24 19(L) -  Calcium 8.9 - 10.3 mg/dL 8.8(L) 8.6(L) -  Total Protein 6.5 - 8.1 g/dL 6.9 7.0 -  Total Bilirubin 0.3 - 1.2 mg/dL 0.8 1.0 -  Alkaline Phos 38 - 126 U/L 68 55 -  AST 15 - 41 U/L 26 17 -  ALT 0 - 44 U/L 37 23 -       DIAGNOSTIC IMAGING:  I have independently reviewed the scans and discussed with the patient.     ASSESSMENT & PLAN:   Primary cancer of right upper lobe of lung ([place]) 1.  Stage IV adenocarcinoma of the lung, PDL 1-50%, other actionable mutations negative: - Keytruda from 05/17/2016 through 09/24/2018 with mild progression. - Cycle 1 of carboplatin and pemetrexed on 12/16/2018. -CT of the chest on  12/17/2018 shows posterior right upper lobe mass measured 4.1 x 3.2 cm, previously 5.2 x 3.9 cm.  Right paratracheal lymph node decreased from 12 mm to 9 mm.  No new areas were seen.  Right breast nodule also decreased in size. - He felt weak for couple of days after first treatment last week.  He also had diarrhea for couple of days. -He missed his Neulasta injection. -We reviewed his blood work which shows white count of 2.9 with ANC of 1400. - We will see him back in 2 weeks for his next cycle.  We are trying to get free Keytruda from drug company.  2.  Brain metastasis: -Right parietal lobe 16.6 x 13.5 x 15.5 mm lesion on MRI dated 11/12/2018 with significant surrounding edema and mass-effect. -SRS to the brain lesion on 11/24/2018. - Slurred speech has improved.  Total time spent is 25 minutes with more than 50% of the time spent face-to-face discussing treatment plan, counseling and coordination of care.    Orders placed this encounter:  No orders of the defined types were placed in this encounter.     Darrell Jack, MD [facility]  [phone]

## 2018-12-23 NOTE — Patient Instructions (Signed)
[facility] at [facility] Discharge Instructions  You were seen today by Dr. Katragadda. He went over your recent lab results. He will see you back in 2 weeks for labs and follow up.   Thank you for choosing [facility] at [facility] to provide your oncology and hematology care.  To afford each patient quality time with our provider, please arrive at least 15 minutes before your scheduled appointment time.   If you have a lab appointment with the Cancer Center please come in thru the  Main Entrance and check in at the main information desk  You need to re-schedule your appointment should you arrive 10 or more minutes late.  We strive to give you quality time with our providers, and arriving late affects you and other patients whose appointments are after yours.  Also, if you no show three or more times for appointments you may be dismissed from the clinic at the providers discretion.     Again, thank you for choosing [facility].  Our hope is that these requests will decrease the amount of time that you wait before being seen by our physicians.       _____________________________________________________________  Should you have questions after your visit to  Cancer Center, please contact our office at [phone] between the hours of 8:00 a.m. and 4:30 p.m.  Voicemails left after 4:00 p.m. will not be returned until the following business day.  For prescription refill requests, have your pharmacy contact our office and allow 72 hours.    Cancer Center Support Programs:   > Cancer Support Group  2nd Tuesday of the month 1pm-2pm, Journey Room    

## 2018-12-24 DIAGNOSIS — C3411 Malignant neoplasm of upper lobe, right bronchus or lung: Secondary | ICD-10-CM | POA: Diagnosis not present

## 2018-12-24 DIAGNOSIS — E782 Mixed hyperlipidemia: Secondary | ICD-10-CM | POA: Diagnosis not present

## 2018-12-24 DIAGNOSIS — I10 Essential (primary) hypertension: Secondary | ICD-10-CM | POA: Diagnosis not present

## 2018-12-24 DIAGNOSIS — E1165 Type 2 diabetes mellitus with hyperglycemia: Secondary | ICD-10-CM | POA: Diagnosis not present

## 2018-12-24 NOTE — Progress Notes (Signed)
  Radiation Oncology         ([phone]) [phone] ________________________________  Name: ISIAIH HOLLENBACH MRN: [id number]  Date: 11/13/2018  DOB: March 14, 1946  DIAGNOSIS:     ICD-10-CM   1. Brain metastasis ([place])  C79.31     NARRATIVE:  The patient was brought to the [facility].  Identity was confirmed.  All relevant records and images related to the planned course of therapy were reviewed.  The patient freely provided informed written consent to proceed with treatment after reviewing the details related to the planned course of therapy. The consent form was witnessed and verified by the simulation staff. Intravenous access was established for contrast administration. Then, the patient was set-up in a stable reproducible supine position for radiation therapy.  A relocatable thermoplastic stereotactic head frame was fabricated for precise immobilization.  CT images were obtained.  Surface markings were placed.  The CT images were loaded into the planning software and fused with the patient's targeting MRI scan.  Then the target and avoidance structures were contoured.  Treatment planning then occurred.  The radiation prescription was entered and confirmed.  I have requested 3D planning  I have requested a DVH of the following structures: Brain stem, brain, left eye, right eye, lenses, optic chiasm, target volumes, uninvolved brain, and normal tissue.    SPECIAL TREATMENT PROCEDURE:  The planned course of therapy using radiation constitutes a special treatment procedure. Special care is required in the management of this patient for the following reasons. This treatment constitutes a Special Treatment Procedure for the following reason: High dose per fraction requiring special monitoring for increased toxicities of treatment including daily imaging.  The special nature of the planned course of radiotherapy will require increased physician supervision and oversight to ensure patient's safety with  optimal treatment outcomes.  PLAN:  The patient will receive 27 Gy in 3 fraction.   ------------------------------------------------  Jodelle Gross, MD, PhD

## 2018-12-27 ENCOUNTER — Telehealth: Payer: Self-pay | Admitting: Radiation Oncology

## 2018-12-27 NOTE — Telephone Encounter (Signed)
  Radiation Oncology         (336) [phone] ________________________________  Name: Darrell Christensen MRN: [id number]  Date of Service: 12/27/2018  DOB: Apr 13, 1946  Post Treatment Telephone Note  Diagnosis:   Progressive Metastatic Stage IV NSCLC, adenocarcinoma of the RUL with new brain metastasis.  Interval Since Last Radiation:  5 weeks   11/18/2018-11/24/2018 SRS Treatment:  PTV1 Left Parietal 17 mm received 27 Gy in 3 fractions  Narrative:  The patient was contacted today for routine follow-up. During treatment he did very well with radiotherapy and his speech was noted to have improved. He is no longer on steroids and is getting ready to consider keytruda maintenance therapy with Dr. Delton Coombes. He has still been taking 2mg  Dexamethasone BID for the last three weeks, despite recommendations otherwise.   Impression/Plan: 1. Progressive Metastatic Stage IV NSCLC, adenocarcinoma of the RUL with new brain metastasis. The patient has been doing well since completion of radiotherapy. We discussed that we would plan to proceed with repeat MRI of the brain in about 2 months. He will continue systemic therapy with Dr. Delton Coombes as well. I encouraged his daughter to drop his dose today or tomorrow to 2 mg every other day for one week, then stop. 2. Claustraphobia. He will continue to premedicate with Ativan prior to MRI scans in the future.     Carola Rhine, PAC

## 2018-12-28 ENCOUNTER — Telehealth: Payer: Medicare Other | Admitting: Radiation Oncology

## 2019-01-05 ENCOUNTER — Inpatient Hospital Stay (HOSPITAL_COMMUNITY): Payer: Medicare Other | Attending: Hematology | Admitting: General Practice

## 2019-01-05 ENCOUNTER — Encounter (HOSPITAL_COMMUNITY): Payer: Self-pay | Admitting: General Practice

## 2019-01-05 DIAGNOSIS — Z79899 Other long term (current) drug therapy: Secondary | ICD-10-CM | POA: Insufficient documentation

## 2019-01-05 DIAGNOSIS — C7931 Secondary malignant neoplasm of brain: Secondary | ICD-10-CM | POA: Insufficient documentation

## 2019-01-05 DIAGNOSIS — Z5111 Encounter for antineoplastic chemotherapy: Secondary | ICD-10-CM | POA: Insufficient documentation

## 2019-01-05 DIAGNOSIS — C3411 Malignant neoplasm of upper lobe, right bronchus or lung: Secondary | ICD-10-CM

## 2019-01-05 NOTE — Progress Notes (Signed)
[facility] CSW Progress Notes  Called patient, states he is doing well and enjoys being able to be independent in his own home.  States he has had difficulty reaching [facility] staff, will send message to [facility] and attempt to problem solve.  Pt states he needs Ensure, was visited by someone (he does not know the name of their agency, but had cell number of [phone]).  Attempted to call, however no answer/VM full so was unable to leave message.  Will message dietitians to see if he can be assisted w Ensure at visit tomorrow.  Will also make sure he is connected to [employer] food box distribution program, patient consents to this referral.  Edwyna Shell, LCSW Clinical Social Worker Phone:  [phone] Cell:  [phone]

## 2019-01-05 NOTE — Progress Notes (Signed)
[facility] Progress Notes  Call to daughter, Costella Hatcher, to touch base re patient situation.  Seems to be tolerating new chemotherapy drug OK.  Remains anxious; however, this is baseline for him.  Per daughter, he has friends brining meals occasionally, likes to live independently, calls daughter when needs arise.  Daughter is now working third shift and homeschooling children, but keeps in touch w patient as much as possible.  CSW will call in a month to touch base.  Daughter has received initial gas card from [facility], can reapply quarterly - was asked to remind CSW when quarter is nearing.  CSW will also double check that patient is on distribution list for [facility] food boxes.  Edwyna Shell, LCSW Clinical Social Worker Phone:  [phone] Cell:  [phone]

## 2019-01-06 ENCOUNTER — Inpatient Hospital Stay (HOSPITAL_COMMUNITY): Payer: Medicare Other

## 2019-01-06 ENCOUNTER — Encounter (HOSPITAL_COMMUNITY): Payer: Self-pay | Admitting: Hematology

## 2019-01-06 ENCOUNTER — Inpatient Hospital Stay (HOSPITAL_BASED_OUTPATIENT_CLINIC_OR_DEPARTMENT_OTHER): Payer: Medicare Other | Admitting: Hematology

## 2019-01-06 ENCOUNTER — Other Ambulatory Visit: Payer: Self-pay

## 2019-01-06 VITALS — BP 127/71 | HR 74 | Temp 96.9°F | Resp 18

## 2019-01-06 DIAGNOSIS — Z79899 Other long term (current) drug therapy: Secondary | ICD-10-CM | POA: Diagnosis not present

## 2019-01-06 DIAGNOSIS — C3411 Malignant neoplasm of upper lobe, right bronchus or lung: Secondary | ICD-10-CM

## 2019-01-06 DIAGNOSIS — C7931 Secondary malignant neoplasm of brain: Secondary | ICD-10-CM | POA: Diagnosis not present

## 2019-01-06 DIAGNOSIS — Z5111 Encounter for antineoplastic chemotherapy: Secondary | ICD-10-CM | POA: Diagnosis not present

## 2019-01-06 LAB — CBC WITH DIFFERENTIAL/PLATELET
Abs Immature Granulocytes: 0.73 10*3/uL — ABNORMAL HIGH (ref 0.00–0.07)
Basophils Absolute: 0.1 10*3/uL (ref 0.0–0.1)
Basophils Relative: 1 %
Eosinophils Absolute: 0.1 10*3/uL (ref 0.0–0.5)
Eosinophils Relative: 0 %
HCT: 39.4 % (ref 39.0–52.0)
Hemoglobin: 12.6 g/dL — ABNORMAL LOW (ref 13.0–17.0)
Immature Granulocytes: 6 %
Lymphocytes Relative: 25 %
Lymphs Abs: 3.3 10*3/uL (ref 0.7–4.0)
MCH: 28.2 pg (ref 26.0–34.0)
MCHC: 32 g/dL (ref 30.0–36.0)
MCV: 88.1 fL (ref 80.0–100.0)
Monocytes Absolute: 2.2 10*3/uL — ABNORMAL HIGH (ref 0.1–1.0)
Monocytes Relative: 17 %
Neutro Abs: 6.7 10*3/uL (ref 1.7–7.7)
Neutrophils Relative %: 51 %
Platelets: 436 10*3/uL — ABNORMAL HIGH (ref 150–400)
RBC: 4.47 MIL/uL (ref 4.22–5.81)
RDW: 16.7 % — ABNORMAL HIGH (ref 11.5–15.5)
WBC: 13.1 10*3/uL — ABNORMAL HIGH (ref 4.0–10.5)
nRBC: 0.2 % (ref 0.0–0.2)

## 2019-01-06 LAB — COMPREHENSIVE METABOLIC PANEL
ALT: 23 U/L (ref 0–44)
AST: 15 U/L (ref 15–41)
Albumin: 3.6 g/dL (ref 3.5–5.0)
Alkaline Phosphatase: 66 U/L (ref 38–126)
Anion gap: 10 (ref 5–15)
BUN: 26 mg/dL — ABNORMAL HIGH (ref 8–23)
CO2: 25 mmol/L (ref 22–32)
Calcium: 8.1 mg/dL — ABNORMAL LOW (ref 8.9–10.3)
Chloride: 104 mmol/L (ref 98–111)
Creatinine, Ser: 1.28 mg/dL — ABNORMAL HIGH (ref 0.61–1.24)
GFR calc Af Amer: >60 mL/min (ref >60)
GFR calc non Af Amer: 55 mL/min — ABNORMAL LOW (ref >60)
Glucose, Bld: 137 mg/dL — ABNORMAL HIGH (ref 70–99)
Potassium: 3.6 mmol/L (ref 3.5–5.1)
Sodium: 139 mmol/L (ref 135–145)
Total Bilirubin: 0.5 mg/dL (ref 0.3–1.2)
Total Protein: 7 g/dL (ref 6.5–8.1)

## 2019-01-06 LAB — MAGNESIUM: Magnesium: 1.7 mg/dL (ref 1.7–2.4)

## 2019-01-06 MED ORDER — SODIUM CHLORIDE 0.9 % IV SOLN
Freq: Once | INTRAVENOUS | Status: AC
  Administered 2019-01-06: 11:00:00 via INTRAVENOUS

## 2019-01-06 MED ORDER — SODIUM CHLORIDE 0.9% FLUSH
10.0000 mL | INTRAVENOUS | Status: DC | PRN
  Administered 2019-01-06: 10 mL
  Filled 2019-01-06: qty 10

## 2019-01-06 MED ORDER — PALONOSETRON HCL INJECTION 0.25 MG/5ML
0.2500 mg | Freq: Once | INTRAVENOUS | Status: AC
  Administered 2019-01-06: 0.25 mg via INTRAVENOUS
  Filled 2019-01-06: qty 5

## 2019-01-06 MED ORDER — SODIUM CHLORIDE 0.9 % IV SOLN
490.0000 mg/m2 | Freq: Once | INTRAVENOUS | Status: AC
  Administered 2019-01-06: 1100 mg via INTRAVENOUS
  Filled 2019-01-06: qty 4

## 2019-01-06 MED ORDER — SODIUM CHLORIDE 0.9 % IV SOLN
494.0000 mg | Freq: Once | INTRAVENOUS | Status: AC
  Administered 2019-01-06: 490 mg via INTRAVENOUS
  Filled 2019-01-06: qty 49

## 2019-01-06 MED ORDER — SODIUM CHLORIDE 0.9 % IV SOLN
Freq: Once | INTRAVENOUS | Status: AC
  Administered 2019-01-06: 11:00:00 via INTRAVENOUS
  Filled 2019-01-06: qty 5

## 2019-01-06 MED ORDER — PEGFILGRASTIM 6 MG/0.6ML ~~LOC~~ PSKT
6.0000 mg | PREFILLED_SYRINGE | Freq: Once | SUBCUTANEOUS | Status: DC
  Filled 2019-01-06: qty 0.6

## 2019-01-06 MED ORDER — HEPARIN SOD (PORK) LOCK FLUSH 100 UNIT/ML IV SOLN
500.0000 [IU] | Freq: Once | INTRAVENOUS | Status: AC | PRN
  Administered 2019-01-06: 13:00:00 500 [IU]

## 2019-01-06 NOTE — Progress Notes (Signed)
I met with patient during the visit with Dr. Delton Coombes today. He is very upset that he has not been kept informed about what we are doing to his body. He said that we always call his daughter and he is the one with cancer and he wants to know. I explained to him that it is never our intentions to make him feel uninformed.  Dr. Delton Coombes went over in detail his scans over the past few months. I helped to further explain why he had to change treatments.  He is worried about money and if we are going to give treatments when he owes a lot.  I explained to him that we will not hold treatments due to money.  Patient was given the contact numbers to the clinic and I explained how to call the certain people he may need.  I gave him my contact information again and explained that I will be available to help him if he should need me.  He verbalizes understanding.

## 2019-01-06 NOTE — Progress Notes (Signed)
Labs and scans reviewed with patient and daughter today at office visit. Proceed with treatment today per MD.   Treatment given per orders. Patient tolerated it well without problems. Vitals stable and discharged home from clinic ambulatory. Follow up as scheduled.

## 2019-01-06 NOTE — Patient Instructions (Signed)
[facility] Discharge Instructions for Patients Receiving Chemotherapy  Today you received the following chemotherapy agents   To help prevent nausea and vomiting after your treatment, we encourage you to take your nausea medication   If you develop nausea and vomiting that is not controlled by your nausea medication, call the clinic.   BELOW ARE SYMPTOMS THAT SHOULD BE REPORTED IMMEDIATELY:  *FEVER GREATER THAN 100.5 F  *CHILLS WITH OR WITHOUT FEVER  NAUSEA AND VOMITING THAT IS NOT CONTROLLED WITH YOUR NAUSEA MEDICATION  *UNUSUAL SHORTNESS OF BREATH  *UNUSUAL BRUISING OR BLEEDING  TENDERNESS IN MOUTH AND THROAT WITH OR WITHOUT PRESENCE OF ULCERS  *URINARY PROBLEMS  *BOWEL PROBLEMS  UNUSUAL RASH Items with * indicate a potential emergency and should be followed up as soon as possible.  Feel free to call the clinic should you have any questions or concerns. The clinic phone number is [phone].  Please show the CHEMO ALERT CARD at check-in to the Emergency Department and triage nurse.   

## 2019-01-06 NOTE — Patient Instructions (Addendum)
[facility] at [facility] Discharge Instructions  You were seen today by Dr. Katragadda. He went over your recent lab results. He will see you back in  for labs and follow up.   Thank you for choosing [facility] at [facility] to provide your oncology and hematology care.  To afford each patient quality time with our provider, please arrive at least 15 minutes before your scheduled appointment time.   If you have a lab appointment with the Cancer Center please come in thru the  Main Entrance and check in at the main information desk  You need to re-schedule your appointment should you arrive 10 or more minutes late.  We strive to give you quality time with our providers, and arriving late affects you and other patients whose appointments are after yours.  Also, if you no show three or more times for appointments you may be dismissed from the clinic at the providers discretion.     Again, thank you for choosing [facility].  Our hope is that these requests will decrease the amount of time that you wait before being seen by our physicians.       _____________________________________________________________  Should you have questions after your visit to [facility], please contact our office at [phone] between the hours of 8:00 a.m. and 4:30 p.m.  Voicemails left after 4:00 p.m. will not be returned until the following business day.  For prescription refill requests, have your pharmacy contact our office and allow 72 hours.    Cancer Center Support Programs:   > Cancer Support Group  2nd Tuesday of the month 1pm-2pm, Journey Room    

## 2019-01-06 NOTE — Progress Notes (Signed)
[address], [place] [address]   CLINIC:  Medical Oncology/Hematology  PCP:  Celene Squibb, MD [place] [address] [phone]   REASON FOR VISIT:  Follow-up for stage IV adenocarcinoma of the right lung   BRIEF ONCOLOGIC HISTORY:  Oncology History  Primary cancer of right upper lobe of lung ([place])  05/14/2015 Procedure   Port placement by Dr. Arnoldo Morale   04/01/2016 Initial Diagnosis   Primary cancer of right upper lobe of lung ([place])   04/02/2016 Procedure   Ultrasound-guided core biopsy performed of a solid 1.5 cm soft tissue nodule in the right chest wall.   04/03/2016 Imaging   MRI brain- No acute and intracranial process or metastasis.  Moderate chronic small vessel ischemic disease.   04/04/2016 Pathology Results   Diagnosis Soft Tissue Needle Core Biopsy, Right Chest Wall SMOOTH MUSCLE NEOPLASM Microscopic Comment The differential diagnosis include low grade leiomyosarcoma. The neoplasm shows spindle cell morphology with rare mitotic figures and minimal atypia, no necrosis present. These cells stains positive for smooth muscle actin, desmin, negative for cytokeratin AE1&3, ck8/18, s100, cd117, cd 34 and cd99. This case also reviewed by Dr. [name] and agree.   04/05/2016 Procedure   CT-guided biopsy of right upper lobe lesion, with tissue specimen sent to pathology, by IR.   04/09/2016 Pathology Results   Lung, needle/core biopsy(ies), Right Upper Lobe - NON SMALL CELL CARCINOMA.   04/17/2016 Pathology Results   PDL1 High Expression.  TPS 50%.   04/24/2016 PET scan   1. Prominently hypermetabolic large right upper lobe mass with metastatic disease to the right paratracheal and right hilar nodal chains, to the descending mesocolon, to the left upper buttock subcutaneous tissues, to the left upper lobe, to the right subscapularis muscle, and possibly to the left adrenal gland and right breast  subcutaneous tissues. Appearance compatible with stage IV lung cancer. 2. There is some focal high activity in the ascending colon which is probably from peristalsis, less likely from a local colon mass. This does raise the possibility that the adjacent mesocolon tumor implant could be due to synchronous colon cancer rather than lung metastasis. 3. Other imaging findings of potential clinical significance: Coronary, aortic arch, and branch vessel atherosclerotic vascular disease. Aortoiliac atherosclerotic vascular disease. Deformity left proximal humerus and left glenohumeral joint from prior trauma. Enlarged prostate gland.   04/26/2016 Pathology Results   FoundationONE: Genomic alterations identified- KRAS X83A, CREBBP splice site [id number], LRP1B S515*, AYO45 T97*, FSF42 splice site [id number], SPTA1 splice site [id number], TP53 C135Y.  Additional findings- MS-Stable, TMB-intermediate (14 Muts/Mb).  No reportable alterations- EGFR, ALK, BRAF, MET, ERBB2, RET, ROS1.   05/17/2016 -  Chemotherapy   The patient had ONDANSETRON IVPB [facility] +/- DEXAMETHASONE, , Intravenous,  Once, 0 of 1 cycle  pembrolizumab (KEYTRUDA) 200 mg in sodium chloride 0.9 % 50 mL chemo infusion, 200 mg, Intravenous, Once, 0 of 5 cycles  for chemotherapy treatment.     08/06/2016 PET scan   IMPRESSION: 1. Overall considerable improvement. The right upper lobe mass is markedly reduced in volume and SUV. Thoracic adenopathy have resolved and the hypermetabolic lesion in the right subscapularis muscle has also resolved. 2. The left upper lobe nodule is no longer appreciably hypermetabolic, and is highly indistinct although this may be due to motion artifact. Faint in indistinct nodularity elsewhere in the lungs likewise not currently hypermetabolic. 3. There is some very faint residual low-grade activity along the subcutaneous deposit  along the upper left buttock region. Marked reduction in size and activity of  the tumor deposit adjacent to the descending colon. 4. Prior right pleural effusion has resolved. 5. Stable appearance of the adrenal glands, with low-level activity and a nodule in the left adrenal gland. 6. Stable small nodule in the right breast, low-grade metabolic activity with maximum SUV 2.2 (formerly 3.0) 7. Other imaging findings of potential clinical significance: Severe arthropathy of the left glenohumeral joint. Coronary, aortic arch, and branch vessel atherosclerotic vascular disease. Aortoiliac atherosclerotic vascular disease. Prominent prostate gland indents the bladder base.    12/16/2018 -  Chemotherapy   The patient had palonosetron (ALOXI) injection 0.25 mg, 0.25 mg, Intravenous,  Once, 2 of 4 cycles Administration: 0.25 mg (12/16/2018), 0.25 mg (01/06/2019) pegfilgrastim (NEULASTA) injection 6 mg, 6 mg, Subcutaneous, Once, 2 of 6 cycles Administration: 6 mg (01/08/2019) pegfilgrastim (NEULASTA ONPRO KIT) injection 6 mg, 6 mg, Subcutaneous, Once, 1 of 5 cycles PEMEtrexed (ALIMTA) 1,100 mg in sodium chloride 0.9 % 100 mL chemo infusion, 490 mg/m2 = 1,125 mg, Intravenous,  Once, 2 of 6 cycles Administration: 1,100 mg (12/16/2018), 1,100 mg (01/06/2019) CARBOplatin (PARAPLATIN) 500 mg in sodium chloride 0.9 % 250 mL chemo infusion, 530 mg (110.7 % of original dose 477.5 mg), Intravenous,  Once, 2 of 4 cycles Dose modification:   (original dose 477.5 mg, Cycle 1),   (original dose 494 mg, Cycle 2) Administration: 500 mg (12/16/2018), 490 mg (01/06/2019) fosaprepitant (EMEND) 150 mg, dexamethasone (DECADRON) 12 mg in sodium chloride 0.9 % 145 mL IVPB, , Intravenous,  Once, 2 of 4 cycles Administration:  (12/16/2018),  (01/06/2019)  for chemotherapy treatment.    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  Two adjacent large masslike areas of  consolidation within the right upper lobe with differential considerations including malignancy or pneumonia. Multiple enlarged right hilar and mediastinal lymph nodes which may be reactive or metastatic in etiology.  Additional indeterminate pulmonary nodules as above. Recommend attention on follow-up.  Indeterminate 2.9 cm left adrenal nodule. This needs dedicated evaluation with pre and post contrast-enhanced CT or MRI after confirmation of pulmonary process.  Indeterminate nodule within the right breast. Recommend dedicated evaluation with mammography.  Hepatic steatosis.   04/02/2016 Procedure   Ultrasound-guided core biopsy performed of a solid 1.5 cm soft tissue nodule in the right chest wall.   04/04/2016 Pathology Results   Diagnosis Soft Tissue Needle Core Biopsy, Right Chest Wall SMOOTH MUSCLE NEOPLASM Microscopic Comment The differential diagnosis include low grade leiomyosarcoma. The neoplasm shows spindle cell morphology with rare mitotic figures and minimal atypia, no necrosis present. These cells stains positive for smooth muscle actin, desmin, negative for cytokeratin AE1&3, ck8/18, s100, cd117, cd 34 and cd99. This case also reviewed by Dr. [name] and agree.       CANCER STAGING: Cancer Staging Primary cancer of right upper lobe of lung ([place]) Staging form: Lung, AJCC 8th Edition - Clinical: Stage IVB (cT3, cN2, cM1c) - Signed by Baird Cancer, PA-C on 05/07/2016    INTERVAL HISTORY:  Mr. Hyson 73 y.o. male seen for follow-up of lung cancer as well as cycle 2 of chemotherapy.  He is accompanied by his daughter today.  Had some diarrhea after cycle 1 which improved.  He does have occasional constipation at this time.  He does report some sleeping problems on occasion.  Denies any major  headaches.  He is continuing dexamethasone taper.  He has gained about 6 pounds since last visit.  Denies any fevers or chills.  REVIEW OF SYSTEMS:  Review of  Systems  Cardiovascular: Positive for leg swelling.  Gastrointestinal: Positive for diarrhea.  Neurological: Positive for headaches and numbness.  Psychiatric/Behavioral: Positive for sleep disturbance.  All other systems reviewed and are negative.    PAST MEDICAL/SURGICAL HISTORY:  Past Medical History:  Diagnosis Date  . Adrenal mass, left ([place]) 04/01/2016  . Diabetes mellitus ([place]) 05/02/2016  . Diabetes mellitus without complication ([place])   . Hypertension   . Mass of breast, right 04/01/2016  . Mass of upper lobe of right lung 04/01/2016   2 adjacent, 5 cm masses, hilar adenopathy  . Primary cancer of right upper lobe of lung ([place]) 04/01/2016   2 adjacent, 5 cm masses, hilar adenopathy  . Pulmonary embolus ([place]) 03/31/2016  . 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 COLONOSCOPY N/A 07/28/2017   Procedure: COLONOSCOPY;  Surgeon: Rogene Houston, MD;  Location: [facility] ENDO SUITE;  Service: Endoscopy;  Laterality: N/A;  205  . PORTACATH PLACEMENT Left 05/13/2016   Procedure: INSERTION PORT-A-CATH;  Surgeon: Aviva Signs, MD;  Location: [facility] ORS;  Service: General;  Laterality: Left;     SOCIAL HISTORY:  Social History   Socioeconomic History  . Marital status: Divorced    Spouse name: Not on file  . Number of children: Not on file  . Years of education: Not on file  . Highest education level: Not on file  Occupational History  . Not on file  Social Needs  . Financial resource strain: Not on file  . Food insecurity    Worry: Not on file    Inability: Not on file  . Transportation needs    Medical: No    Non-medical: No  Tobacco Use  . Smoking status: Former Smoker    Years: 61.00    Quit date: 04/11/2014    Years since quitting: 4.7  . Smokeless tobacco: Never Used  . Tobacco comment: patient does vape smoking x 2 years  Substance and Sexual Activity  . Alcohol use: Not Currently    Comment: quit 10 years  . Drug use: No   . Sexual activity: Not on file  Lifestyle  . Physical activity    Days per week: Not on file    Minutes per session: Not on file  . Stress: Not on file  Relationships  . Social Herbalist on phone: Not on file    Gets together: Not on file    Attends religious service: Not on file    Active member of club or organization: Not on file    Attends meetings of clubs or organizations: Not on file    Relationship status: Not on file  . Intimate partner violence    Fear of current or ex partner: Not on file    Emotionally abused: Not on file    Physically abused: Not on file    Forced sexual activity: Not on file  Other Topics Concern  . Not on file  Social History Narrative  . Not on file    FAMILY HISTORY:  Family History  Problem Relation Age of Onset  . Stroke Mother   . Heart attack Father   . Heart attack Brother     CURRENT MEDICATIONS:  Outpatient Encounter Medications as of 01/06/2019  Medication  Sig  . ALPRAZolam (XANAX) 1 MG tablet Take 1 mg by mouth 3 (three) times daily as needed for anxiety.  Marland Kitchen amLODipine (NORVASC) 2.5 MG tablet Take 2.5 mg by mouth daily.  Marland Kitchen CARBOPLATIN IV Inject into the vein every 21 ( twenty-one) days.  Marland Kitchen dexamethasone (DECADRON) 4 MG tablet Take 1 tablet (4 mg total) by mouth every 6 (six) hours.  . folic acid (FOLVITE) 1 MG tablet Take 1 tablet (1 mg total) by mouth daily. Start 5-7 days before Alimta chemotherapy. Continue until 21 days after Alimta completed.  . furosemide (LASIX) 20 MG tablet Take 2 tablets (40 mg total) by mouth daily.  Marland Kitchen GLIPIZIDE XL 5 MG 24 hr tablet Take 5 mg by mouth daily.  . hydrocortisone cream 0.5 % Apply 1 application topically 2 (two) times daily.  . hydrOXYzine (ATARAX/VISTARIL) 25 MG tablet Take 1 tablet (25 mg total) by mouth 3 (three) times daily as needed.  Marland Kitchen LORazepam (ATIVAN) 2 MG tablet 1 tab po 30 minutes prior to radiation or MRI scans. Do not take Xanax within 6 hours of taking Ativan  .  metFORMIN (GLUCOPHAGE) 500 MG tablet Take 500 mg by mouth 2 (two) times daily.  . metoprolol succinate (TOPROL-XL) 25 MG 24 hr tablet TAKE ONE TABLET BY MOUTH IN THE EVENING.  Marland Kitchen nystatin (MYCOSTATIN) 100000 UNIT/ML suspension Take 5 mLs (500,000 Units total) by mouth 4 (four) times daily.  . ondansetron (ZOFRAN) 4 MG tablet Take 1 tablet (4 mg total) by mouth every 8 (eight) hours as needed for nausea or vomiting.  Marland Kitchen oxyCODONE-acetaminophen (PERCOCET) 10-325 MG tablet   . Pembrolizumab (KEYTRUDA IV) Inject into the vein.  Marland Kitchen PEMEtrexed 500 mg/m2 in sodium chloride 0.9 % 100 mL Inject into the vein every 21 ( twenty-one) days.  . potassium chloride (K-DUR,KLOR-CON) 10 MEQ tablet   . prochlorperazine (COMPAZINE) 10 MG tablet Take 1 tablet (10 mg total) by mouth every 6 (six) hours as needed (Nausea or vomiting).  . simvastatin (ZOCOR) 20 MG tablet Take 20 mg by mouth every evening.  . Skin Protectants, Misc. (EUCERIN) cream Apply topically as needed for dry skin.  Marland Kitchen spironolactone (ALDACTONE) 50 MG tablet Take 1 tablet (50 mg total) by mouth daily.  Marland Kitchen triamcinolone cream (KENALOG) 0.1 % Apply 1 application topically 2 (two) times daily.  . [DISCONTINUED] lidocaine-prilocaine (EMLA) cream Apply a quarter size amount to affected area 1 hour prior to coming to chemotherapy. Cover with plastic wrap.   No facility-administered encounter medications on file as of 01/06/2019.     ALLERGIES:  No Known Allergies   PHYSICAL EXAM:  ECOG Performance status: 1  Vitals:   01/06/19 0858  BP: (!) 144/89  Pulse: 66  Resp: 18  Temp: (!) 97.1 F (36.2 C)  SpO2: 95%   Filed Weights   01/06/19 0858  Weight: 211 lb (95.7 kg)    Physical Exam Vitals signs reviewed.  Constitutional:      Appearance: Normal appearance.  Cardiovascular:     Rate and Rhythm: Normal rate and regular rhythm.     Heart sounds: Normal heart sounds.  Pulmonary:     Effort: Pulmonary effort is normal.     Breath sounds:  Normal breath sounds.  Abdominal:     General: There is no distension.     Palpations: Abdomen is soft. There is no mass.  Musculoskeletal:        General: No swelling.  Skin:    General: Skin is warm.  Neurological:     General: No focal deficit present.     Mental Status: He is alert and oriented to person, place, and time.  Psychiatric:        Mood and Affect: Mood normal.        Behavior: Behavior normal.    Muscle power is 4 out of 5 in all extremities.  LABORATORY DATA:  I have reviewed the labs as listed.  CBC    Component Value Date/Time   WBC 13.1 (H) 01/06/2019 0827   RBC 4.47 01/06/2019 0827   HGB 12.6 (L) 01/06/2019 0827   HGB 13.5 05/02/2016 0852   HCT 39.4 01/06/2019 0827   HCT 41.0 05/02/2016 0852   PLT 436 (H) 01/06/2019 0827   PLT 436 (H) 05/02/2016 0852   MCV 88.1 01/06/2019 0827   MCV 85.7 05/02/2016 0852   MCH 28.2 01/06/2019 0827   MCHC 32.0 01/06/2019 0827   RDW 16.7 (H) 01/06/2019 0827   RDW 13.9 05/02/2016 0852   LYMPHSABS 3.3 01/06/2019 0827   LYMPHSABS 3.4 (H) 05/02/2016 0852   MONOABS 2.2 (H) 01/06/2019 0827   MONOABS 1.2 (H) 05/02/2016 0852   EOSABS 0.1 01/06/2019 0827   EOSABS 1.6 (H) 05/02/2016 0852   BASOSABS 0.1 01/06/2019 0827   BASOSABS 0.2 (H) 05/02/2016 0852   CMP Latest Ref Rng & Units 01/06/2019 12/23/2018 12/16/2018  Glucose 70 - 99 mg/dL 137(H) 148(H) 125(H)  BUN 8 - 23 mg/dL 26(H) 33(H) 13  Creatinine 0.61 - 1.24 mg/dL 1.28(H) 1.19 1.17  Sodium 135 - 145 mmol/L 139 140 134(L)  Potassium 3.5 - 5.1 mmol/L 3.6 4.2 4.3  Chloride 98 - 111 mmol/L 104 102 104  CO2 22 - 32 mmol/L 25 24 19(L)  Calcium 8.9 - 10.3 mg/dL 8.1(L) 8.8(L) 8.6(L)  Total Protein 6.5 - 8.1 g/dL 7.0 6.9 7.0  Total Bilirubin 0.3 - 1.2 mg/dL 0.5 0.8 1.0  Alkaline Phos 38 - 126 U/L 66 68 55  AST 15 - 41 U/L _0 ALT 0 - 44 U/L 23 37 23       DIAGNOSTIC IMAGING:  I have independently reviewed the scans and discussed with the patient.      ASSESSMENT & PLAN:   Primary cancer of right upper lobe of lung ([place]) 1.  Stage IV adenocarcinoma of the lung, PDL 1-50%, other actionable mutations negative: - Keytruda from 05/17/2016 through 09/24/2018 with mild progression. - Cycle 1 of carboplatin and pemetrexed on 12/16/2018. -CT of the chest on 12/17/2018 shows posterior right upper lobe mass measured 4.1 x 3.2 cm, previously 5.2 x 3.9 cm.  Right paratracheal lymph node decreased from 12 mm to 9 mm.  No new areas were seen.  Right breast nodule also decreased in size. - I have discussed and reviewed the scans with the patient and her daughter are his request. - We reviewed his blood work.  He may proceed with cycle 2 without any dose modifications. - We are trying to obtain free medication Keytruda from the drug company to start after completion of 4 cycles of chemotherapy. -We had prolonged discussion about treatment plan and prognosis with the patient and her daughter again today. -He will come back in 3 weeks for follow-up.  2.  Brain metastasis: -Right parietal lobe 16.6 x 13.5 x 15.5 mm lesion on MRI dated 11/12/2018 with significant surrounding edema and mass-effect. -SRS to the brain lesion on 11/24/2018. - He is still on dexamethasone every other day.  I have  told him to discontinue it.  Total time spent is 40 minutes with more than 50% of the time spent face-to-face reviewing all the scans, discussing prognosis, treatment plan, counseling and coordination of care.    Orders placed this encounter:  No orders of the defined types were placed in this encounter.     Derek Jack, MD [place] [phone]

## 2019-01-07 ENCOUNTER — Other Ambulatory Visit (HOSPITAL_COMMUNITY): Payer: Self-pay | Admitting: Hematology

## 2019-01-07 ENCOUNTER — Ambulatory Visit (HOSPITAL_COMMUNITY): Payer: Medicare Other

## 2019-01-08 ENCOUNTER — Inpatient Hospital Stay (HOSPITAL_COMMUNITY): Payer: Medicare Other

## 2019-01-08 ENCOUNTER — Other Ambulatory Visit: Payer: Self-pay

## 2019-01-08 ENCOUNTER — Encounter (HOSPITAL_COMMUNITY): Payer: Self-pay

## 2019-01-08 VITALS — BP 131/72 | HR 68 | Temp 97.7°F | Resp 18

## 2019-01-08 DIAGNOSIS — Z5111 Encounter for antineoplastic chemotherapy: Secondary | ICD-10-CM | POA: Diagnosis not present

## 2019-01-08 DIAGNOSIS — C7931 Secondary malignant neoplasm of brain: Secondary | ICD-10-CM | POA: Diagnosis not present

## 2019-01-08 DIAGNOSIS — C3411 Malignant neoplasm of upper lobe, right bronchus or lung: Secondary | ICD-10-CM

## 2019-01-08 DIAGNOSIS — Z79899 Other long term (current) drug therapy: Secondary | ICD-10-CM | POA: Diagnosis not present

## 2019-01-08 MED ORDER — PEGFILGRASTIM INJECTION 6 MG/0.6ML ~~LOC~~
6.0000 mg | PREFILLED_SYRINGE | Freq: Once | SUBCUTANEOUS | Status: AC
  Administered 2019-01-08: 6 mg via SUBCUTANEOUS
  Filled 2019-01-08: qty 0.6

## 2019-01-08 NOTE — Progress Notes (Signed)
Patient tolerated injection with no complaints voiced.  Site clean and dry with no bruising or swelling noted at site.  Band aid applied.  Vss with discharge and left ambulatory with no s/s of distress noted.  

## 2019-01-08 NOTE — Progress Notes (Signed)
Nutrition Follow-up:  Received message from Education officer, museum, patient in need of ensure.  Patient with stage IV adenocarcinoma of lung was on keytruda until 09/24/2018 and mild progression.  Treatment switched to carboplatin and pemetrexed on 12/16/2018.  Has received brain radiation for brain mets.    Spoke with patient after injection of neulasta today.  Patient reports that appetite is fairly good.  Reports that friends bring him meals (meat and vegetables).  He has been drinking ensure shakes about 1 a day if he has them.      Medications: reviewed  Labs: reviewed  Anthropometrics:   Weight last seen by RD was on 11/04/2016 and 228 lb 6.4 oz (trending up)  Weight on 9/2 was 211 lb  7% weight loss in the last 2 year and 2 months, not significant but concerning with disease progression recently and change in therapy.    NUTRITION DIAGNOSIS:  Inadequate oral intake continues   INTERVENTION:  Provided another case of ensure enlive to patient today.  Encouraged him to drink 1-2 per day for added calories and prevent weight from declining.  We discussed briefly high calorie, high protein foods.   Patient receiving Duanne Limerick food box Contact information provided to patient    MONITORING, EVALUATION, GOAL: Patient will consume adequate calories and protein to maintain weight and lean muscle mass   NEXT VISIT: phone f/u on October 2nd  Evelio Rueda B. Zenia Resides, [place], [place] Registered Dietitian [phone] (pager)

## 2019-01-09 NOTE — Assessment & Plan Note (Signed)
1.  Stage IV adenocarcinoma of the lung, PDL 1-50%, other actionable mutations negative: - Keytruda from 05/17/2016 through 09/24/2018 with mild progression. - Cycle 1 of carboplatin and pemetrexed on 12/16/2018. -CT of the chest on 12/17/2018 shows posterior right upper lobe mass measured 4.1 x 3.2 cm, previously 5.2 x 3.9 cm.  Right paratracheal lymph node decreased from 12 mm to 9 mm.  No new areas were seen.  Right breast nodule also decreased in size. - I have discussed and reviewed the scans with the patient and her daughter are his request. - We reviewed his blood work.  He may proceed with cycle 2 without any dose modifications. - We are trying to obtain free medication Keytruda from the drug company to start after completion of 4 cycles of chemotherapy. -We had prolonged discussion about treatment plan and prognosis with the patient and her daughter again today. -He will come back in 3 weeks for follow-up.  2.  Brain metastasis: -Right parietal lobe 16.6 x 13.5 x 15.5 mm lesion on MRI dated 11/12/2018 with significant surrounding edema and mass-effect. -SRS to the brain lesion on 11/24/2018. - He is still on dexamethasone every other day.  I have told him to discontinue it.

## 2019-01-12 ENCOUNTER — Other Ambulatory Visit (HOSPITAL_COMMUNITY): Payer: Self-pay | Admitting: *Deleted

## 2019-01-12 ENCOUNTER — Encounter (HOSPITAL_COMMUNITY): Payer: Self-pay | Admitting: *Deleted

## 2019-01-12 MED ORDER — HYDROCORTISONE 2 % EX LOTN: 1.0000 "application " | TOPICAL_LOTION | Freq: Three times a day (TID) | CUTANEOUS | 3 refills | Status: DC

## 2019-01-12 NOTE — Progress Notes (Signed)
Patient called clinic reporting red rash all over his shoulders, arms, chest.  He reports that it looks like the "measles"  He reports that it itches. It came up on Saturday following his treatment and injection.    I talked with Harriet Pho, NP and she advised for him to take Benadryl over the counter as needed and then orders received to call in hydrocortisone cream 2% apply to affected area three times daily.    Patient advised of the above and he verbalizes understanding.

## 2019-01-13 NOTE — Progress Notes (Signed)
  Radiation Oncology         [phone] ________________________________  Name: SHIVEN JUNIOUS MRN: [id number]  Date: 11/24/2018  DOB: [birth year]  End of Treatment Note  Diagnosis:   Brain metastasis     Indication for treatment:  palliative       Radiation treatment dates:   11/18/2018 through 11/24/2018  Site/dose:    The PTV1 was treated to a dose of 27 Gray in 3 fractions at 9 Gy per fraction.  This was completed using a 4 field approach, corresponding to a course of radiosurgery.  Narrative: The patient tolerated radiation treatment well.   There were no signs of acute toxicity after treatment.  Plan: The patient has completed radiation treatment. The patient will return to radiation oncology clinic for routine followup in one month. I advised the patient to call or return sooner if they have any questions or concerns related to their recovery or treatment. ________________________________  ------------------------------------------------  Jodelle Gross, MD, PhD

## 2019-01-27 ENCOUNTER — Other Ambulatory Visit: Payer: Self-pay

## 2019-01-27 ENCOUNTER — Inpatient Hospital Stay (HOSPITAL_COMMUNITY): Payer: Medicare Other

## 2019-01-27 ENCOUNTER — Inpatient Hospital Stay (HOSPITAL_COMMUNITY): Payer: Medicare Other | Admitting: Hematology

## 2019-01-27 ENCOUNTER — Encounter (HOSPITAL_COMMUNITY): Payer: Self-pay | Admitting: Hematology

## 2019-01-27 DIAGNOSIS — Z5111 Encounter for antineoplastic chemotherapy: Secondary | ICD-10-CM | POA: Diagnosis not present

## 2019-01-27 DIAGNOSIS — Z79899 Other long term (current) drug therapy: Secondary | ICD-10-CM | POA: Diagnosis not present

## 2019-01-27 DIAGNOSIS — C3411 Malignant neoplasm of upper lobe, right bronchus or lung: Secondary | ICD-10-CM

## 2019-01-27 DIAGNOSIS — C7931 Secondary malignant neoplasm of brain: Secondary | ICD-10-CM | POA: Diagnosis not present

## 2019-01-27 LAB — COMPREHENSIVE METABOLIC PANEL
ALT: 21 U/L (ref 0–44)
AST: 22 U/L (ref 15–41)
Albumin: 3.6 g/dL (ref 3.5–5.0)
Alkaline Phosphatase: 77 U/L (ref 38–126)
Anion gap: 15 (ref 5–15)
BUN: 14 mg/dL (ref 8–23)
CO2: 26 mmol/L (ref 22–32)
Calcium: 6.7 mg/dL — ABNORMAL LOW (ref 8.9–10.3)
Chloride: 102 mmol/L (ref 98–111)
Creatinine, Ser: 1.31 mg/dL — ABNORMAL HIGH (ref 0.61–1.24)
GFR calc Af Amer: >60 mL/min (ref >60)
GFR calc non Af Amer: 53 mL/min — ABNORMAL LOW (ref >60)
Glucose, Bld: 126 mg/dL — ABNORMAL HIGH (ref 70–99)
Potassium: 3.8 mmol/L (ref 3.5–5.1)
Sodium: 143 mmol/L (ref 135–145)
Total Bilirubin: 0.6 mg/dL (ref 0.3–1.2)
Total Protein: 7.3 g/dL (ref 6.5–8.1)

## 2019-01-27 LAB — CBC WITH DIFFERENTIAL/PLATELET
Abs Immature Granulocytes: 0.17 10*3/uL — ABNORMAL HIGH (ref 0.00–0.07)
Basophils Absolute: 0.1 10*3/uL (ref 0.0–0.1)
Basophils Relative: 1 %
Eosinophils Absolute: 0 10*3/uL (ref 0.0–0.5)
Eosinophils Relative: 0 %
HCT: 31.9 % — ABNORMAL LOW (ref 39.0–52.0)
Hemoglobin: 10.3 g/dL — ABNORMAL LOW (ref 13.0–17.0)
Immature Granulocytes: 1 %
Lymphocytes Relative: 16 %
Lymphs Abs: 2.8 10*3/uL (ref 0.7–4.0)
MCH: 28.9 pg (ref 26.0–34.0)
MCHC: 32.3 g/dL (ref 30.0–36.0)
MCV: 89.6 fL (ref 80.0–100.0)
Monocytes Absolute: 2.7 10*3/uL — ABNORMAL HIGH (ref 0.1–1.0)
Monocytes Relative: 16 %
Neutro Abs: 11.2 10*3/uL — ABNORMAL HIGH (ref 1.7–7.7)
Neutrophils Relative %: 66 %
Platelets: 626 10*3/uL — ABNORMAL HIGH (ref 150–400)
RBC: 3.56 MIL/uL — ABNORMAL LOW (ref 4.22–5.81)
RDW: 18.3 % — ABNORMAL HIGH (ref 11.5–15.5)
WBC: 17 10*3/uL — ABNORMAL HIGH (ref 4.0–10.5)
nRBC: 0 % (ref 0.0–0.2)

## 2019-01-27 LAB — MAGNESIUM: Magnesium: 1.2 mg/dL — ABNORMAL LOW (ref 1.7–2.4)

## 2019-01-27 MED ORDER — SODIUM CHLORIDE 0.9 % IV SOLN
485.5000 mg | Freq: Once | INTRAVENOUS | Status: AC
  Administered 2019-01-27: 490 mg via INTRAVENOUS
  Filled 2019-01-27: qty 49

## 2019-01-27 MED ORDER — SODIUM CHLORIDE 0.9 % IV SOLN
400.0000 mg/m2 | Freq: Once | INTRAVENOUS | Status: AC
  Administered 2019-01-27: 900 mg via INTRAVENOUS
  Filled 2019-01-27: qty 36

## 2019-01-27 MED ORDER — PALONOSETRON HCL INJECTION 0.25 MG/5ML
0.2500 mg | Freq: Once | INTRAVENOUS | Status: AC
  Administered 2019-01-27: 0.25 mg via INTRAVENOUS
  Filled 2019-01-27: qty 5

## 2019-01-27 MED ORDER — HEPARIN SOD (PORK) LOCK FLUSH 100 UNIT/ML IV SOLN
500.0000 [IU] | Freq: Once | INTRAVENOUS | Status: AC | PRN
  Administered 2019-01-27: 500 [IU]

## 2019-01-27 MED ORDER — MAGNESIUM SULFATE 2 GM/50ML IV SOLN
2.0000 g | Freq: Once | INTRAVENOUS | Status: AC
  Administered 2019-01-27: 2 g via INTRAVENOUS
  Filled 2019-01-27: qty 50

## 2019-01-27 MED ORDER — SODIUM CHLORIDE 0.9 % IV SOLN
INTRAVENOUS | Status: DC
  Administered 2019-01-27: 13:00:00 via INTRAVENOUS

## 2019-01-27 MED ORDER — MAGNESIUM OXIDE 400 (241.3 MG) MG PO TABS: 400.0000 mg | ORAL_TABLET | Freq: Two times a day (BID) | ORAL | 2 refills | Status: DC

## 2019-01-27 MED ORDER — TORSEMIDE 20 MG PO TABS: 20.0000 mg | ORAL_TABLET | Freq: Every day | ORAL | 1 refills | Status: DC

## 2019-01-27 MED ORDER — CYANOCOBALAMIN 1000 MCG/ML IJ SOLN
1000.0000 ug | Freq: Once | INTRAMUSCULAR | Status: AC
  Administered 2019-01-27: 1000 ug via INTRAMUSCULAR
  Filled 2019-01-27: qty 1

## 2019-01-27 MED ORDER — SODIUM CHLORIDE 0.9 % IV SOLN
Freq: Once | INTRAVENOUS | Status: AC
  Administered 2019-01-27: 14:00:00 via INTRAVENOUS
  Filled 2019-01-27: qty 5

## 2019-01-27 MED ORDER — PEGFILGRASTIM 6 MG/0.6ML ~~LOC~~ PSKT: 6.0000 mg | PREFILLED_SYRINGE | Freq: Once | SUBCUTANEOUS | Status: DC

## 2019-01-27 MED ORDER — SODIUM CHLORIDE 0.9% FLUSH
10.0000 mL | INTRAVENOUS | Status: DC | PRN
  Administered 2019-01-27: 10 mL
  Filled 2019-01-27: qty 10

## 2019-01-27 NOTE — Progress Notes (Signed)
Darrell Christensen, [place]ensen [id number]   CLINIC:  Medical Oncology/Hematology  PCP:  Celene Squibb, MD [place] [address] [phone]   REASON FOR VISIT:  Follow-up for stage IV adenocarcinoma of the right lung   BRIEF ONCOLOGIC HISTORY:  Oncology History  Primary cancer of right upper lobe of lung ([place])  05/14/2015 Procedure   Port placement by Dr. Arnoldo Morale   04/01/2016 Initial Diagnosis   Primary cancer of right upper lobe of lung ([place])   04/02/2016 Procedure   Ultrasound-guided core biopsy performed of a solid 1.5 cm soft tissue nodule in the right chest wall.   04/03/2016 Imaging   MRI brain- No acute and intracranial process or metastasis.  Moderate chronic small vessel ischemic disease.   04/04/2016 Pathology Results   Diagnosis Soft Tissue Needle Core Biopsy, Right Chest Wall SMOOTH MUSCLE NEOPLASM Microscopic Comment The differential diagnosis include low grade leiomyosarcoma. The neoplasm shows spindle cell morphology with rare mitotic figures and minimal atypia, no necrosis present. These cells stains positive for smooth muscle actin, desmin, negative for cytokeratin AE1&3, ck8/18, s100, cd117, cd 34 and cd99. This case also reviewed by Dr. [name] and agree.   04/05/2016 Procedure   CT-guided biopsy of right upper lobe lesion, with tissue specimen sent to pathology, by IR.   04/09/2016 Pathology Results   Lung, needle/core biopsy(ies), Right Upper Lobe - NON SMALL CELL CARCINOMA.   04/17/2016 Pathology Results   PDL1 High Expression.  TPS 50%.   04/24/2016 PET scan   1. Prominently hypermetabolic large right upper lobe mass with metastatic disease to the right paratracheal and right hilar nodal chains, to the descending mesocolon, to the left upper buttock subcutaneous tissues, to the left upper lobe, to the right subscapularis muscle, and possibly to the left adrenal gland and right breast  subcutaneous tissues. Appearance compatible with stage IV lung cancer. 2. There is some focal high activity in the ascending colon which is probably from peristalsis, less likely from a local colon mass. This does raise the possibility that the adjacent mesocolon tumor implant could be due to synchronous colon cancer rather than lung metastasis. 3. Other imaging findings of potential clinical significance: Coronary, aortic arch, and branch vessel atherosclerotic vascular disease. Aortoiliac atherosclerotic vascular disease. Deformity left proximal humerus and left glenohumeral joint from prior trauma. Enlarged prostate gland.   04/26/2016 Pathology Results   FoundationONE: Genomic alterations identified- KRAS X83A, CREBBP splice site [id number], LRP1B S515*, AYO45 T97*, FSF42 splice site [id number], SPTA1 splice site [id number], TP53 C135Y.  Additional findings- MS-Stable, TMB-intermediate (14 Muts/Mb).  No reportable alterations- EGFR, ALK, BRAF, MET, ERBB2, RET, ROS1.   05/17/2016 -  Chemotherapy   The patient had ONDANSETRON IVPB [facility] +/- DEXAMETHASONE, , Intravenous,  Once, 0 of 1 cycle  pembrolizumab (KEYTRUDA) 200 mg in sodium chloride 0.9 % 50 mL chemo infusion, 200 mg, Intravenous, Once, 0 of 5 cycles  for chemotherapy treatment.     08/06/2016 PET scan   IMPRESSION: 1. Overall considerable improvement. The right upper lobe mass is markedly reduced in volume and SUV. Thoracic adenopathy have resolved and the hypermetabolic lesion in the right subscapularis muscle has also resolved. 2. The left upper lobe nodule is no longer appreciably hypermetabolic, and is highly indistinct although this may be due to motion artifact. Faint in indistinct nodularity elsewhere in the lungs likewise not currently hypermetabolic. 3. There is some very faint residual low-grade activity along the subcutaneous deposit  along the upper left buttock region. Marked reduction in size and activity of  the tumor deposit adjacent to the descending colon. 4. Prior right pleural effusion has resolved. 5. Stable appearance of the adrenal glands, with low-level activity and a nodule in the left adrenal gland. 6. Stable small nodule in the right breast, low-grade metabolic activity with maximum SUV 2.2 (formerly 3.0) 7. Other imaging findings of potential clinical significance: Severe arthropathy of the left glenohumeral joint. Coronary, aortic arch, and branch vessel atherosclerotic vascular disease. Aortoiliac atherosclerotic vascular disease. Prominent prostate gland indents the bladder base.    12/16/2018 -  Chemotherapy   The patient had palonosetron (ALOXI) injection 0.25 mg, 0.25 mg, Intravenous,  Once, 3 of 4 cycles Administration: 0.25 mg (12/16/2018), 0.25 mg (01/06/2019) pegfilgrastim (NEULASTA) injection 6 mg, 6 mg, Subcutaneous, Once, 3 of 6 cycles Administration: 6 mg (01/08/2019) pegfilgrastim (NEULASTA ONPRO KIT) injection 6 mg, 6 mg, Subcutaneous, Once, 2 of 5 cycles PEMEtrexed (ALIMTA) 1,100 mg in sodium chloride 0.9 % 100 mL chemo infusion, 490 mg/m2 = 1,125 mg, Intravenous,  Once, 3 of 6 cycles Dose modification: 400 mg/m2 (80 % of original dose 500 mg/m2, Cycle 3, Reason: Other (see comments), Comment: rash, swelling) Administration: 1,100 mg (12/16/2018), 1,100 mg (01/06/2019) CARBOplatin (PARAPLATIN) 500 mg in sodium chloride 0.9 % 250 mL chemo infusion, 530 mg (110.7 % of original dose 477.5 mg), Intravenous,  Once, 3 of 4 cycles Dose modification:   (original dose 477.5 mg, Cycle 1),   (original dose 485.5 mg, Cycle 3),   (original dose 494 mg, Cycle 2) Administration: 500 mg (12/16/2018), 490 mg (01/06/2019) fosaprepitant (EMEND) 150 mg, dexamethasone (DECADRON) 12 mg in sodium chloride 0.9 % 145 mL IVPB, , Intravenous,  Once, 3 of 4 cycles Administration:  (12/16/2018),  (01/06/2019)  for chemotherapy treatment.    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  Two adjacent large masslike areas of consolidation within the right upper lobe with differential considerations including malignancy or pneumonia. Multiple enlarged right hilar and mediastinal lymph nodes which may be reactive or metastatic in etiology.  Additional indeterminate pulmonary nodules as above. Recommend attention on follow-up.  Indeterminate 2.9 cm left adrenal nodule. This needs dedicated evaluation with pre and post contrast-enhanced CT or MRI after confirmation of pulmonary process.  Indeterminate nodule within the right breast. Recommend dedicated evaluation with mammography.  Hepatic steatosis.   04/02/2016 Procedure   Ultrasound-guided core biopsy performed of a solid 1.5 cm soft tissue nodule in the right chest wall.   04/04/2016 Pathology Results   Diagnosis Soft Tissue Needle Core Biopsy, Right Chest Wall SMOOTH MUSCLE NEOPLASM Microscopic Comment The differential diagnosis include low grade leiomyosarcoma. The neoplasm shows spindle cell morphology with rare mitotic figures and minimal atypia, no necrosis present. These cells stains positive for smooth muscle actin, desmin, negative for cytokeratin AE1&3, ck8/18, s100, cd117, cd 34 and cd99. This case also reviewed by Dr. [name] and agree.       CANCER STAGING: Cancer Staging Primary cancer of right upper lobe of lung ([place]) Staging form: Lung, AJCC 8th Edition - Clinical: Stage IVB (cT3, cN2, cM1c) - Signed by Baird Cancer, PA-C on 05/07/2016    INTERVAL HISTORY:  Mr. Jeune 73 y.o. male seen for follow-up of lung cancer and administration of cycle 3 of chemotherapy.  He is accompanied by his daughter.  He reported rash  on the upper trunk and on the lower extremities after getting the Neulasta injection 3 weeks ago.  He reported the rash is itchy.  He called our office and was  told to use hydrocortisone cream.  Apparently did not help much.  Rash lasted about 2 weeks.  He also reported worsening of his lower extremity swelling.  He is currently taking Lasix 20 mg twice daily which is not completely helping.  He is urinating but swelling has not been changing.  It is questionable whether he is still taking spironolactone.  He is taking potassium supplements when he takes Lasix.  Numbness in the feet has been stable.  Chronic cough is also stable.  REVIEW OF SYSTEMS:  Review of Systems  Respiratory: Positive for cough.   Cardiovascular: Positive for leg swelling.  Skin: Positive for itching and rash.  Neurological: Positive for headaches and numbness.  All other systems reviewed and are negative.    PAST MEDICAL/SURGICAL HISTORY:  Past Medical History:  Diagnosis Date  . Adrenal mass, left ([place]) 04/01/2016  . Diabetes mellitus ([place]) 05/02/2016  . Diabetes mellitus without complication ([facility])   . Hypertension   . Mass of breast, right 04/01/2016  . Mass of upper lobe of right lung 04/01/2016   2 adjacent, 5 cm masses, hilar adenopathy  . Primary cancer of right upper lobe of lung ([place]) 04/01/2016   2 adjacent, 5 cm masses, hilar adenopathy  . Pulmonary embolus ([place]) 03/31/2016  . 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 COLONOSCOPY N/A 07/28/2017   Procedure: COLONOSCOPY;  Surgeon: Rogene Houston, MD;  Location: [facility] ENDO SUITE;  Service: Endoscopy;  Laterality: N/A;  205  . PORTACATH PLACEMENT Left 05/13/2016   Procedure: INSERTION PORT-A-CATH;  Surgeon: Aviva Signs, MD;  Location: [facility] ORS;  Service: General;  Laterality: Left;     SOCIAL HISTORY:  Social History   Socioeconomic History  . Marital status: Divorced    Spouse name: Not on file  . Number of children: Not on file  . Years of education: Not on file  . Highest education level: Not on file  Occupational History  . Not on file  Social Needs  .  Financial resource strain: Not on file  . Food insecurity    Worry: Not on file    Inability: Not on file  . Transportation needs    Medical: No    Non-medical: No  Tobacco Use  . Smoking status: Former Smoker    Years: 61.00    Quit date: 04/11/2014    Years since quitting: 4.8  . Smokeless tobacco: Never Used  . Tobacco comment: patient does vape smoking x 2 years  Substance and Sexual Activity  . Alcohol use: Not Currently    Comment: quit 10 years  . Drug use: No  . Sexual activity: Not on file  Lifestyle  . Physical activity    Days per week: Not on file    Minutes per session: Not on file  . Stress: Not on file  Relationships  . Social Herbalist on phone: Not on file    Gets together: Not on file    Attends religious service: Not on file    Active member of club or organization: Not on file    Attends meetings of clubs or organizations: Not on file    Relationship status: Not on file  . Intimate partner violence    Fear  of current or ex partner: Not on file    Emotionally abused: Not on file    Physically abused: Not on file    Forced sexual activity: Not on file  Other Topics Concern  . Not on file  Social History Narrative  . Not on file    FAMILY HISTORY:  Family History  Problem Relation Age of Onset  . Stroke Mother   . Heart attack Father   . Heart attack Brother     CURRENT MEDICATIONS:  Outpatient Encounter Medications as of 01/27/2019  Medication Sig  . ALPRAZolam (XANAX) 1 MG tablet Take 1 mg by mouth 3 (three) times daily as needed for anxiety.  Marland Kitchen amLODipine (NORVASC) 2.5 MG tablet Take 2.5 mg by mouth daily.  Marland Kitchen CARBOPLATIN IV Inject into the vein every 21 ( twenty-one) days.  . folic acid (FOLVITE) 1 MG tablet Take 1 tablet (1 mg total) by mouth daily. Start 5-7 days before Alimta chemotherapy. Continue until 21 days after Alimta completed.  . furosemide (LASIX) 20 MG tablet Take 2 tablets (40 mg total) by mouth daily.  Marland Kitchen GLIPIZIDE  XL 5 MG 24 hr tablet Take 5 mg by mouth daily.  . metFORMIN (GLUCOPHAGE) 500 MG tablet Take 500 mg by mouth 2 (two) times daily.  . metoprolol succinate (TOPROL-XL) 25 MG 24 hr tablet TAKE ONE TABLET BY MOUTH IN THE EVENING.  Marland Kitchen oxyCODONE-acetaminophen (PERCOCET) 10-325 MG tablet   . Pembrolizumab (KEYTRUDA IV) Inject into the vein.  Marland Kitchen PEMEtrexed 500 mg/m2 in sodium chloride 0.9 % 100 mL Inject into the vein every 21 ( twenty-one) days.  . potassium chloride (K-DUR,KLOR-CON) 10 MEQ tablet   . simvastatin (ZOCOR) 20 MG tablet Take 20 mg by mouth every evening.  . [DISCONTINUED] spironolactone (ALDACTONE) 50 MG tablet Take 1 tablet (50 mg total) by mouth daily.  Marland Kitchen lidocaine-prilocaine (EMLA) cream APPLY A QUARTER SIZE AMOUNT TO THE AFFECTED AREA 1 HOUR PRIOR TO COMING TO CHEMOTHERAPY. COVER WITH A PLASTIC WRAP. (Patient not taking: Reported on 01/27/2019)  . magnesium oxide (MAG-OX) 400 (241.3 Mg) MG tablet Take 1 tablet (400 mg total) by mouth 2 (two) times daily.  . ondansetron (ZOFRAN) 4 MG tablet Take 1 tablet (4 mg total) by mouth every 8 (eight) hours as needed for nausea or vomiting. (Patient not taking: Reported on 01/27/2019)  . prochlorperazine (COMPAZINE) 10 MG tablet Take 1 tablet (10 mg total) by mouth every 6 (six) hours as needed (Nausea or vomiting). (Patient not taking: Reported on 01/27/2019)  . torsemide (DEMADEX) 20 MG tablet Take 1 tablet (20 mg total) by mouth daily.  . [DISCONTINUED] dexamethasone (DECADRON) 4 MG tablet Take 1 tablet (4 mg total) by mouth every 6 (six) hours.  . [DISCONTINUED] hydrocortisone cream 0.5 % Apply 1 application topically 2 (two) times daily. (Patient not taking: Reported on 01/27/2019)  . [DISCONTINUED] HYDROCORTISONE, TOPICAL, 2 % LOTN Apply 1 application topically 3 (three) times daily.  . [DISCONTINUED] hydrOXYzine (ATARAX/VISTARIL) 25 MG tablet Take 1 tablet (25 mg total) by mouth 3 (three) times daily as needed. (Patient not taking: Reported on  01/27/2019)  . [DISCONTINUED] LORazepam (ATIVAN) 2 MG tablet 1 tab po 30 minutes prior to radiation or MRI scans. Do not take Xanax within 6 hours of taking Ativan  . [DISCONTINUED] nystatin (MYCOSTATIN) 100000 UNIT/ML suspension Take 5 mLs (500,000 Units total) by mouth 4 (four) times daily.  . [DISCONTINUED] Skin Protectants, Misc. (EUCERIN) cream Apply topically as needed for dry skin. (Patient not  taking: Reported on 01/27/2019)  . [DISCONTINUED] triamcinolone cream (KENALOG) 0.1 % Apply 1 application topically 2 (two) times daily.   No facility-administered encounter medications on file as of 01/27/2019.     ALLERGIES:  No Known Allergies   PHYSICAL EXAM:  ECOG Performance status: 1  Vitals:   01/27/19 1155  BP: 135/70  Pulse: (!) 125  Resp: (!) 22  Temp: 97.7 F (36.5 C)  SpO2: 92%   Filed Weights   01/27/19 1155  Weight: 209 lb (94.8 kg)    Physical Exam Vitals signs reviewed.  Constitutional:      Appearance: Normal appearance.  Cardiovascular:     Rate and Rhythm: Normal rate and regular rhythm.     Heart sounds: Normal heart sounds.  Pulmonary:     Effort: Pulmonary effort is normal.     Breath sounds: Normal breath sounds.  Abdominal:     General: There is no distension.     Palpations: Abdomen is soft. There is no mass.  Musculoskeletal:        General: No swelling.  Skin:    General: Skin is warm.  Neurological:     General: No focal deficit present.     Mental Status: He is alert and oriented to person, place, and time.  Psychiatric:        Mood and Affect: Mood normal.        Behavior: Behavior normal.    No skin rash seen at this time.  2+ edema bilaterally.  LABORATORY DATA:  I have reviewed the labs as listed.  CBC    Component Value Date/Time   WBC 17.0 (H) 01/27/2019 1101   RBC 3.56 (L) 01/27/2019 1101   HGB 10.3 (L) 01/27/2019 1101   HGB 13.5 05/02/2016 0852   HCT 31.9 (L) 01/27/2019 1101   HCT 41.0 05/02/2016 0852   PLT 626 (H)  01/27/2019 1101   PLT 436 (H) 05/02/2016 0852   MCV 89.6 01/27/2019 1101   MCV 85.7 05/02/2016 0852   MCH 28.9 01/27/2019 1101   MCHC 32.3 01/27/2019 1101   RDW 18.3 (H) 01/27/2019 1101   RDW 13.9 05/02/2016 0852   LYMPHSABS 2.8 01/27/2019 1101   LYMPHSABS 3.4 (H) 05/02/2016 0852   MONOABS 2.7 (H) 01/27/2019 1101   MONOABS 1.2 (H) 05/02/2016 0852   EOSABS 0.0 01/27/2019 1101   EOSABS 1.6 (H) 05/02/2016 0852   BASOSABS 0.1 01/27/2019 1101   BASOSABS 0.2 (H) 05/02/2016 0852   CMP Latest Ref Rng & Units 01/27/2019 01/06/2019 12/23/2018  Glucose 70 - 99 mg/dL 126(H) 137(H) 148(H)  BUN 8 - 23 mg/dL 14 26(H) 33(H)  Creatinine 0.61 - 1.24 mg/dL 1.31(H) 1.28(H) 1.19  Sodium 135 - 145 mmol/L 143 139 140  Potassium 3.5 - 5.1 mmol/L 3.8 3.6 4.2  Chloride 98 - 111 mmol/L 102 104 102  CO2 22 - 32 mmol/L '26 25 24  '$ Calcium 8.9 - 10.3 mg/dL 6.7(L) 8.1(L) 8.8(L)  Total Protein 6.5 - 8.1 g/dL 7.3 7.0 6.9  Total Bilirubin 0.3 - 1.2 mg/dL 0.6 0.5 0.8  Alkaline Phos 38 - 126 U/L 77 66 68  AST 15 - 41 U/L '22 15 26  '$ ALT 0 - 44 U/L 21 23 37       DIAGNOSTIC IMAGING:  I have independently reviewed the scans and discussed with the patient.     ASSESSMENT & PLAN:   Primary cancer of right upper lobe of lung ([place]) 1.  Stage IV adenocarcinoma of the lung,  PDL 1-50%, other actionable mutations negative: - Keytruda from 05/17/2016 through 09/24/2018 with mild progression. -CT of the chest on 12/17/2018 shows posterior right upper lobe mass measured 4.1 x 3.2 cm, previously 5.2 x 3.9 cm.  Right paratracheal lymph node decreased from 12 mm to 9 mm.  No new areas were seen.  Right breast nodule also decreased in size. -2 cycles of carboplatin and pemetrexed on 12/25/2018 and 01/06/2019. - He reported rash on the upper trunk and in the upper thigh region after getting the Neulasta injection.  He reportedly had this rash which is itchy for about 2 weeks.  He applied hydrocortisone cream without much help. - The  rash could be from pemetrexed or Neulasta.  We will change Neulasta to a generic injection.  We will also cut back on pemetrexed by 20%.  I have told him to take dexamethasone 4 mg in the mornings for the next 2 days. - We will proceed with cycle 3 today.  We will see him back in 1 week for follow-up.  2.  Brain metastasis: -Right parietal lobe 16.6 x 13.5 x 15.5 mm lesion on MRI dated 11/12/2018 with significant surrounding edema and mass-effect. -SRS to the brain lesion on 11/24/2018. - He has been tapered off of dexamethasone on 01/09/2019.  3.  Lower extremity swelling: -He is currently taking Lasix 20 mg twice daily.  This is not helping. -I will discontinue Lasix.  We will start him on torsemide 20 mg in the mornings.  He will continue potassium supplements.  4.  Hypomagnesemia: -Magnesium is 1.2 today.  He will get IV magnesium 2 g. -We will start him on magnesium 400 mg twice daily.  Total time spent is 25 minutes with more than 50% of the time spent face-to-face discussing treatment plan, counseling and coordination of care.    Orders placed this encounter:  No orders of the defined types were placed in this encounter.     Derek Jack, MD [place] [phone]

## 2019-01-27 NOTE — Patient Instructions (Addendum)
[facility] Discharge Instructions for Patients Receiving Chemotherapy   Beginning January 23rd 2017 lab work for the [facility] will be done in the  Main lab at [facility] on 1st floor. If you have a lab appointment with the [facility] please come in thru the  Main Entrance and check in at the main information desk   Today you received the following chemotherapy agents Carboplatin and Alimta as well as Magnesium infusion and Vit B12 injection. Follow-up as scheduled. Call clinic for any questions or concerns  To help prevent nausea and vomiting after your treatment, we encourage you to take your nausea medication   If you develop nausea and vomiting, or diarrhea that is not controlled by your medication, call the clinic.  The clinic phone number is [phone]. Office hours are Monday-Friday 8:30am-5:00pm.  BELOW ARE SYMPTOMS THAT SHOULD BE REPORTED IMMEDIATELY:  *FEVER GREATER THAN 101.0 F  *CHILLS WITH OR WITHOUT FEVER  NAUSEA AND VOMITING THAT IS NOT CONTROLLED WITH YOUR NAUSEA MEDICATION  *UNUSUAL SHORTNESS OF BREATH  *UNUSUAL BRUISING OR BLEEDING  TENDERNESS IN MOUTH AND THROAT WITH OR WITHOUT PRESENCE OF ULCERS  *URINARY PROBLEMS  *BOWEL PROBLEMS  UNUSUAL RASH Items with * indicate a potential emergency and should be followed up as soon as possible. If you have an emergency after office hours please contact your primary care physician or go to the nearest emergency department.  Please call the clinic during office hours if you have any questions or concerns.   You may also contact the Patient Navigator at [phone] should you have any questions or need assistance in obtaining follow up care.      Resources For Cancer Patients and their Caregivers ? American Cancer Society: Can assist with transportation, wigs, general needs, runs Look Good Feel Better.        319-604-3811 ? Cancer Care: Provides financial assistance, online  support groups, medication/co-pay assistance.  1-800-813-HOPE 856-340-3820) ? [place] Assists [place] cancer patients and their families through emotional , educational and financial support.  301-354-4129 ? [place] DSS Where to apply for food stamps, Medicaid and utility assistance. 609-223-1197 ? [facility]: Transportation to medical appointments. 647-409-9584 ? Social Security Administration: May apply for disability if have a Stage IV cancer. 318-439-1442 (438)619-3783 ? [employer], Disability and Transit Services: Assists with nutrition, care and transit needs. 531-065-4526

## 2019-01-27 NOTE — Patient Instructions (Signed)
[place] at [facility] Discharge Instructions  You were seen today by Dr. Delton Coombes. He went over your recent lab results and how you've been feeling after your last treatment. He will send in a new fluid pill that you should take once in the morning. Try using regular eye drops to help with eye dryness. He is also going to send in a prescription for magnesium that he should take twice a day. He would like him to take Dexamethasone 1 tablet in the morning for the next few days. He will see you back in 1 week for labs, treatment and follow up.   Thank you for choosing [place] at [facility] to provide your oncology and hematology care.  To afford each patient quality time with our provider, please arrive at least 15 minutes before your scheduled appointment time.   If you have a lab appointment with the [facility] please come in thru the  Main Entrance and check in at the main information desk  You need to re-schedule your appointment should you arrive 10 or more minutes late.  We strive to give you quality time with our providers, and arriving late affects you and other patients whose appointments are after yours.  Also, if you no show three or more times for appointments you may be dismissed from the clinic at the providers discretion.     Again, thank you for choosing [facility].  Our hope is that these requests will decrease the amount of time that you wait before being seen by our physicians.       _____________________________________________________________  Should you have questions after your visit to [facility], please contact our office at [phone] between the hours of 8:00 a.m. and 4:30 p.m.  Voicemails left after 4:00 p.m. will not be returned until the following business day.  For prescription refill requests, have your pharmacy contact our office and allow 72 hours.    Cancer Center Support  Programs:   > Cancer Support Group  2nd Tuesday of the month 1pm-2pm, Journey Room

## 2019-01-27 NOTE — Assessment & Plan Note (Addendum)
1.  Stage IV adenocarcinoma of the lung, PDL 1-50%, other actionable mutations negative: - Keytruda from 05/17/2016 through 09/24/2018 with mild progression. -CT of the chest on 12/17/2018 shows posterior right upper lobe mass measured 4.1 x 3.2 cm, previously 5.2 x 3.9 cm.  Right paratracheal lymph node decreased from 12 mm to 9 mm.  No new areas were seen.  Right breast nodule also decreased in size. -2 cycles of carboplatin and pemetrexed on 12/25/2018 and 01/06/2019. - He reported rash on the upper trunk and in the upper thigh region after getting the Neulasta injection.  He reportedly had this rash which is itchy for about 2 weeks.  He applied hydrocortisone cream without much help. - The rash could be from pemetrexed or Neulasta.  We will change Neulasta to a generic injection.  We will also cut back on pemetrexed by 20%.  I have told him to take dexamethasone 4 mg in the mornings for the next 2 days. - We will proceed with cycle 3 today.  We will see him back in 1 week for follow-up.  2.  Brain metastasis: -Right parietal lobe 16.6 x 13.5 x 15.5 mm lesion on MRI dated 11/12/2018 with significant surrounding edema and mass-effect. -SRS to the brain lesion on 11/24/2018. - He has been tapered off of dexamethasone on 01/09/2019.  3.  Lower extremity swelling: -He is currently taking Lasix 20 mg twice daily.  This is not helping. -I will discontinue Lasix.  We will start him on torsemide 20 mg in the mornings.  He will continue potassium supplements.  4.  Hypomagnesemia: -Magnesium is 1.2 today.  He will get IV magnesium 2 g. -We will start him on magnesium 400 mg twice daily.

## 2019-01-27 NOTE — Progress Notes (Signed)
[name] reviewed with and pt seen by Dr. Delton Coombes and pt approved for chemo today with Magnesium infusion added per MD                                                                           Darrell Christensen tolerated chemo tx with magnesium infusion and Vit B12 injection well without complaints or incident. VSS upon discharge. Pt discharged via wheelchair in satisfactory condition

## 2019-01-28 DIAGNOSIS — R601 Generalized edema: Secondary | ICD-10-CM | POA: Diagnosis not present

## 2019-01-28 DIAGNOSIS — C3411 Malignant neoplasm of upper lobe, right bronchus or lung: Secondary | ICD-10-CM | POA: Diagnosis not present

## 2019-01-28 DIAGNOSIS — E43 Unspecified severe protein-calorie malnutrition: Secondary | ICD-10-CM | POA: Diagnosis not present

## 2019-01-28 DIAGNOSIS — C7989 Secondary malignant neoplasm of other specified sites: Secondary | ICD-10-CM | POA: Diagnosis not present

## 2019-01-28 DIAGNOSIS — R634 Abnormal weight loss: Secondary | ICD-10-CM | POA: Diagnosis not present

## 2019-01-28 DIAGNOSIS — R0602 Shortness of breath: Secondary | ICD-10-CM | POA: Diagnosis not present

## 2019-01-28 DIAGNOSIS — R4182 Altered mental status, unspecified: Secondary | ICD-10-CM | POA: Diagnosis not present

## 2019-01-29 ENCOUNTER — Encounter (HOSPITAL_COMMUNITY): Payer: Self-pay

## 2019-01-29 ENCOUNTER — Other Ambulatory Visit: Payer: Self-pay

## 2019-01-29 ENCOUNTER — Inpatient Hospital Stay (HOSPITAL_COMMUNITY): Payer: Medicare Other

## 2019-01-29 VITALS — BP 138/87 | HR 101 | Temp 97.6°F | Resp 20

## 2019-01-29 DIAGNOSIS — Z5111 Encounter for antineoplastic chemotherapy: Secondary | ICD-10-CM | POA: Diagnosis not present

## 2019-01-29 DIAGNOSIS — C3411 Malignant neoplasm of upper lobe, right bronchus or lung: Secondary | ICD-10-CM

## 2019-01-29 DIAGNOSIS — C7931 Secondary malignant neoplasm of brain: Secondary | ICD-10-CM | POA: Diagnosis not present

## 2019-01-29 DIAGNOSIS — Z79899 Other long term (current) drug therapy: Secondary | ICD-10-CM | POA: Diagnosis not present

## 2019-01-29 MED ORDER — PEGFILGRASTIM-CBQV 6 MG/0.6ML ~~LOC~~ SOSY
6.0000 mg | PREFILLED_SYRINGE | Freq: Once | SUBCUTANEOUS | Status: AC
  Administered 2019-01-29: 6 mg via SUBCUTANEOUS
  Filled 2019-01-29: qty 0.6

## 2019-01-29 NOTE — Patient Instructions (Signed)
[facility] at [facility]  Discharge Instructions:   _______________________________________________________________  Thank you for choosing [facility] at [facility] to provide your oncology and hematology care.  To afford each patient quality time with our providers, please arrive at least 15 minutes before your scheduled appointment.  You need to re-schedule your appointment if you arrive 10 or more minutes late.  We strive to give you quality time with our providers, and arriving late affects you and other patients whose appointments are after yours.  Also, if you no show three or more times for appointments you may be dismissed from the clinic.  Again, thank you for choosing [facility] at [facility]. Our hope is that these requests will allow you access to exceptional care and in a timely manner. _______________________________________________________________  If you have questions after your visit, please contact our office at [phone] between the hours of 8:30 a.m. and 5:00 p.m. Voicemails left after 4:30 p.m. will not be returned until the following business day. _______________________________________________________________  For prescription refill requests, have your pharmacy contact our office. _______________________________________________________________  Recommendations made by the consultant and any test results will be sent to your referring physician. _______________________________________________________________ 

## 2019-01-29 NOTE — Progress Notes (Signed)
Kamarri G Thorington presents today for injection per MD orders. Udenyca administered SQ in right Upper Arm. Administration without incident. Patient tolerated well. AVS printed off and highlighted for patient. Pt teaching performed pertaining to Udenyca injection. Understanding verbalized.

## 2019-02-04 ENCOUNTER — Inpatient Hospital Stay (HOSPITAL_COMMUNITY): Payer: Medicare Other | Attending: Hematology

## 2019-02-04 ENCOUNTER — Encounter (HOSPITAL_COMMUNITY): Payer: Self-pay | Admitting: Hematology

## 2019-02-04 ENCOUNTER — Ambulatory Visit (HOSPITAL_COMMUNITY)
Admission: RE | Admit: 2019-02-04 | Discharge: 2019-02-04 | Disposition: A | Discharge status: Home / Self Care | Payer: Medicare Other | Source: Ambulatory Visit | Attending: Hematology | Admitting: Hematology

## 2019-02-04 ENCOUNTER — Inpatient Hospital Stay (HOSPITAL_COMMUNITY): Payer: Medicare Other | Admitting: Hematology

## 2019-02-04 ENCOUNTER — Other Ambulatory Visit: Payer: Self-pay

## 2019-02-04 VITALS — BP 114/75 | HR 106 | Temp 97.5°F | Resp 20 | Wt 202.0 lb

## 2019-02-04 DIAGNOSIS — C7931 Secondary malignant neoplasm of brain: Secondary | ICD-10-CM | POA: Insufficient documentation

## 2019-02-04 DIAGNOSIS — Z0000 Encounter for general adult medical examination without abnormal findings: Secondary | ICD-10-CM | POA: Diagnosis not present

## 2019-02-04 DIAGNOSIS — R600 Localized edema: Secondary | ICD-10-CM | POA: Diagnosis not present

## 2019-02-04 DIAGNOSIS — Z7984 Long term (current) use of oral hypoglycemic drugs: Secondary | ICD-10-CM | POA: Diagnosis not present

## 2019-02-04 DIAGNOSIS — Z87891 Personal history of nicotine dependence: Secondary | ICD-10-CM | POA: Insufficient documentation

## 2019-02-04 DIAGNOSIS — Z5111 Encounter for antineoplastic chemotherapy: Secondary | ICD-10-CM | POA: Diagnosis not present

## 2019-02-04 DIAGNOSIS — M7989 Other specified soft tissue disorders: Secondary | ICD-10-CM | POA: Insufficient documentation

## 2019-02-04 DIAGNOSIS — C3411 Malignant neoplasm of upper lobe, right bronchus or lung: Secondary | ICD-10-CM | POA: Insufficient documentation

## 2019-02-04 DIAGNOSIS — I10 Essential (primary) hypertension: Secondary | ICD-10-CM | POA: Diagnosis not present

## 2019-02-04 DIAGNOSIS — Z79899 Other long term (current) drug therapy: Secondary | ICD-10-CM | POA: Insufficient documentation

## 2019-02-04 DIAGNOSIS — E119 Type 2 diabetes mellitus without complications: Secondary | ICD-10-CM | POA: Diagnosis not present

## 2019-02-04 DIAGNOSIS — Z86711 Personal history of pulmonary embolism: Secondary | ICD-10-CM | POA: Insufficient documentation

## 2019-02-04 DIAGNOSIS — Z5189 Encounter for other specified aftercare: Secondary | ICD-10-CM | POA: Insufficient documentation

## 2019-02-04 LAB — CBC WITH DIFFERENTIAL/PLATELET
Abs Immature Granulocytes: 0.13 10*3/uL — ABNORMAL HIGH (ref 0.00–0.07)
Basophils Absolute: 0.1 10*3/uL (ref 0.0–0.1)
Basophils Relative: 1 %
Eosinophils Absolute: 0.1 10*3/uL (ref 0.0–0.5)
Eosinophils Relative: 1 %
HCT: 28.9 % — ABNORMAL LOW (ref 39.0–52.0)
Hemoglobin: 9 g/dL — ABNORMAL LOW (ref 13.0–17.0)
Immature Granulocytes: 1 %
Lymphocytes Relative: 15 %
Lymphs Abs: 1.3 10*3/uL (ref 0.7–4.0)
MCH: 28.8 pg (ref 26.0–34.0)
MCHC: 31.1 g/dL (ref 30.0–36.0)
MCV: 92.6 fL (ref 80.0–100.0)
Monocytes Absolute: 0.8 10*3/uL (ref 0.1–1.0)
Monocytes Relative: 9 %
Neutro Abs: 6.6 10*3/uL (ref 1.7–7.7)
Neutrophils Relative %: 73 %
Platelets: 172 10*3/uL (ref 150–400)
RBC: 3.12 MIL/uL — ABNORMAL LOW (ref 4.22–5.81)
RDW: 18.5 % — ABNORMAL HIGH (ref 11.5–15.5)
WBC: 9.1 10*3/uL (ref 4.0–10.5)
nRBC: 0 % (ref 0.0–0.2)

## 2019-02-04 LAB — COMPREHENSIVE METABOLIC PANEL
ALT: 40 U/L (ref 0–44)
AST: 39 U/L (ref 15–41)
Albumin: 3.8 g/dL (ref 3.5–5.0)
Alkaline Phosphatase: 112 U/L (ref 38–126)
Anion gap: 14 (ref 5–15)
BUN: 37 mg/dL — ABNORMAL HIGH (ref 8–23)
CO2: 24 mmol/L (ref 22–32)
Calcium: 8.9 mg/dL (ref 8.9–10.3)
Chloride: 104 mmol/L (ref 98–111)
Creatinine, Ser: 1.37 mg/dL — ABNORMAL HIGH (ref 0.61–1.24)
GFR calc Af Amer: 58 mL/min — ABNORMAL LOW (ref >60)
GFR calc non Af Amer: 50 mL/min — ABNORMAL LOW (ref >60)
Glucose, Bld: 131 mg/dL — ABNORMAL HIGH (ref 70–99)
Potassium: 3.9 mmol/L (ref 3.5–5.1)
Sodium: 142 mmol/L (ref 135–145)
Total Bilirubin: 0.9 mg/dL (ref 0.3–1.2)
Total Protein: 7.1 g/dL (ref 6.5–8.1)

## 2019-02-04 LAB — MAGNESIUM: Magnesium: 2.2 mg/dL (ref 1.7–2.4)

## 2019-02-04 MED ORDER — INFLUENZA VAC A&B SA ADJ QUAD 0.5 ML IM PRSY: 0.5000 mL | PREFILLED_SYRINGE | Freq: Once | INTRAMUSCULAR | Status: DC

## 2019-02-04 NOTE — Progress Notes (Signed)
[address], [place] [address]   CLINIC:  Medical Oncology/Hematology  PCP:  Celene Squibb, MD [place] [address] [phone]   REASON FOR VISIT:  Follow-up for stage IV adenocarcinoma of the right lung   BRIEF ONCOLOGIC HISTORY:  Oncology History  Primary cancer of right upper lobe of lung ([place])  05/14/2015 Procedure   Port placement by Dr. Arnoldo Morale   04/01/2016 Initial Diagnosis   Primary cancer of right upper lobe of lung ([place])   04/02/2016 Procedure   Ultrasound-guided core biopsy performed of a solid 1.5 cm soft tissue nodule in the right chest wall.   04/03/2016 Imaging   MRI brain- No acute and intracranial process or metastasis.  Moderate chronic small vessel ischemic disease.   04/04/2016 Pathology Results   Diagnosis Soft Tissue Needle Core Biopsy, Right Chest Wall SMOOTH MUSCLE NEOPLASM Microscopic Comment The differential diagnosis include low grade leiomyosarcoma. The neoplasm shows spindle cell morphology with rare mitotic figures and minimal atypia, no necrosis present. These cells stains positive for smooth muscle actin, desmin, negative for cytokeratin AE1&3, ck8/18, s100, cd117, cd 34 and cd99. This case also reviewed by Dr. [name] and agree.   04/05/2016 Procedure   CT-guided biopsy of right upper lobe lesion, with tissue specimen sent to pathology, by IR.   04/09/2016 Pathology Results   Lung, needle/core biopsy(ies), Right Upper Lobe - NON SMALL CELL CARCINOMA.   04/17/2016 Pathology Results   PDL1 High Expression.  TPS 50%.   04/24/2016 PET scan   1. Prominently hypermetabolic large right upper lobe mass with metastatic disease to the right paratracheal and right hilar nodal chains, to the descending mesocolon, to the left upper buttock subcutaneous tissues, to the left upper lobe, to the right subscapularis muscle, and possibly to the left adrenal gland and right breast  subcutaneous tissues. Appearance compatible with stage IV lung cancer. 2. There is some focal high activity in the ascending colon which is probably from peristalsis, less likely from a local colon mass. This does raise the possibility that the adjacent mesocolon tumor implant could be due to synchronous colon cancer rather than lung metastasis. 3. Other imaging findings of potential clinical significance: Coronary, aortic arch, and branch vessel atherosclerotic vascular disease. Aortoiliac atherosclerotic vascular disease. Deformity left proximal humerus and left glenohumeral joint from prior trauma. Enlarged prostate gland.   04/26/2016 Pathology Results   FoundationONE: Genomic alterations identified- KRAS X83A, CREBBP splice site [id number], LRP1B S515*, AYO45 T97*, FSF42 splice site [id number], SPTA1 splice site [id number], TP53 C135Y.  Additional findings- MS-Stable, TMB-intermediate (14 Muts/Mb).  No reportable alterations- EGFR, ALK, BRAF, MET, ERBB2, RET, ROS1.   05/17/2016 -  Chemotherapy   The patient had ONDANSETRON IVPB [facility] +/- DEXAMETHASONE, , Intravenous,  Once, 0 of 1 cycle  pembrolizumab (KEYTRUDA) 200 mg in sodium chloride 0.9 % 50 mL chemo infusion, 200 mg, Intravenous, Once, 0 of 5 cycles  for chemotherapy treatment.     08/06/2016 PET scan   IMPRESSION: 1. Overall considerable improvement. The right upper lobe mass is markedly reduced in volume and SUV. Thoracic adenopathy have resolved and the hypermetabolic lesion in the right subscapularis muscle has also resolved. 2. The left upper lobe nodule is no longer appreciably hypermetabolic, and is highly indistinct although this may be due to motion artifact. Faint in indistinct nodularity elsewhere in the lungs likewise not currently hypermetabolic. 3. There is some very faint residual low-grade activity along the subcutaneous deposit  along the upper left buttock region. Marked reduction in size and activity of  the tumor deposit adjacent to the descending colon. 4. Prior right pleural effusion has resolved. 5. Stable appearance of the adrenal glands, with low-level activity and a nodule in the left adrenal gland. 6. Stable small nodule in the right breast, low-grade metabolic activity with maximum SUV 2.2 (formerly 3.0) 7. Other imaging findings of potential clinical significance: Severe arthropathy of the left glenohumeral joint. Coronary, aortic arch, and branch vessel atherosclerotic vascular disease. Aortoiliac atherosclerotic vascular disease. Prominent prostate gland indents the bladder base.    12/16/2018 -  Chemotherapy   The patient had palonosetron (ALOXI) injection 0.25 mg, 0.25 mg, Intravenous,  Once, 3 of 4 cycles Administration: 0.25 mg (12/16/2018), 0.25 mg (01/27/2019), 0.25 mg (01/06/2019) pegfilgrastim (NEULASTA) injection 6 mg, 6 mg, Subcutaneous, Once, 2 of 2 cycles Administration: 6 mg (01/08/2019) pegfilgrastim (NEULASTA ONPRO KIT) injection 6 mg, 6 mg, Subcutaneous, Once, 2 of 2 cycles pegfilgrastim-cbqv (UDENYCA) injection 6 mg, 6 mg, Subcutaneous, Once, 1 of 4 cycles Administration: 6 mg (01/29/2019) PEMEtrexed (ALIMTA) 1,100 mg in sodium chloride 0.9 % 100 mL chemo infusion, 490 mg/m2 = 1,125 mg, Intravenous,  Once, 3 of 6 cycles Dose modification: 400 mg/m2 (80 % of original dose 500 mg/m2, Cycle 3, Reason: Other (see comments), Comment: rash, swelling) Administration: 1,100 mg (12/16/2018), 900 mg (01/27/2019), 1,100 mg (01/06/2019) CARBOplatin (PARAPLATIN) 500 mg in sodium chloride 0.9 % 250 mL chemo infusion, 530 mg (110.7 % of original dose 477.5 mg), Intravenous,  Once, 3 of 4 cycles Dose modification:   (original dose 477.5 mg, Cycle 1),   (original dose 485.5 mg, Cycle 3),   (original dose 494 mg, Cycle 2) Administration: 500 mg (12/16/2018), 490 mg (01/27/2019), 490 mg (01/06/2019) fosaprepitant (EMEND) 150 mg, dexamethasone (DECADRON) 12 mg in sodium chloride 0.9 % 145 mL  IVPB, , Intravenous,  Once, 3 of 4 cycles Administration:  (12/16/2018),  (01/27/2019),  (01/06/2019)  for chemotherapy treatment.    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  Two adjacent large masslike areas of consolidation within the right upper lobe with differential considerations including malignancy or pneumonia. Multiple enlarged right hilar and mediastinal lymph nodes which may be reactive or metastatic in etiology.  Additional indeterminate pulmonary nodules as above. Recommend attention on follow-up.  Indeterminate 2.9 cm left adrenal nodule. This needs dedicated evaluation with pre and post contrast-enhanced CT or MRI after confirmation of pulmonary process.  Indeterminate nodule within the right breast. Recommend dedicated evaluation with mammography.  Hepatic steatosis.   04/02/2016 Procedure   Ultrasound-guided core biopsy performed of a solid 1.5 cm soft tissue nodule in the right chest wall.   04/04/2016 Pathology Results   Diagnosis Soft Tissue Needle Core Biopsy, Right Chest Wall SMOOTH MUSCLE NEOPLASM Microscopic Comment The differential diagnosis include low grade leiomyosarcoma. The neoplasm shows spindle cell morphology with rare mitotic figures and minimal atypia, no necrosis present. These cells stains positive for smooth muscle actin, desmin, negative for cytokeratin AE1&3, ck8/18, s100, cd117, cd 34 and cd99. This case also reviewed by Dr. [name] and agree.       CANCER STAGING: Cancer Staging Primary cancer of right upper lobe of lung ([place]) Staging form: Lung, AJCC 8th Edition - Clinical: Stage IVB (cT3, cN2, cM1c) - Signed by Baird Cancer, PA-C on 05/07/2016    INTERVAL HISTORY:  Mr.  Barbe 73 y.o. male seen for follow-up and toxicity assessment after cycle 3 of chemotherapy.  He received cycle 3 on  01/27/2019.  He did not experience any nausea vomiting or diarrhea.  He had some constipation.  He complained of erythema in the legs and swelling.  Numbness in the extremities has been stable.  He reports decreasing appetite and energy levels.  No skin rashes reported.  He did not experience any musculoskeletal pains.  REVIEW OF SYSTEMS:  Review of Systems  Cardiovascular: Positive for leg swelling.  Gastrointestinal: Positive for constipation.  Neurological: Positive for numbness.  All other systems reviewed and are negative.    PAST MEDICAL/SURGICAL HISTORY:  Past Medical History:  Diagnosis Date  . Adrenal mass, left ([place]) 04/01/2016  . Diabetes mellitus ([place]) 05/02/2016  . Diabetes mellitus without complication ([place])   . Hypertension   . Mass of breast, right 04/01/2016  . Mass of upper lobe of right lung 04/01/2016   2 adjacent, 5 cm masses, hilar adenopathy  . Primary cancer of right upper lobe of lung ([place]) 04/01/2016   2 adjacent, 5 cm masses, hilar adenopathy  . Pulmonary embolus ([place]) 03/31/2016  . 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 COLONOSCOPY N/A 07/28/2017   Procedure: COLONOSCOPY;  Surgeon: Rogene Houston, MD;  Location: [facility] ENDO SUITE;  Service: Endoscopy;  Laterality: N/A;  205  . PORTACATH PLACEMENT Left 05/13/2016   Procedure: INSERTION PORT-A-CATH;  Surgeon: Aviva Signs, MD;  Location: [facility] ORS;  Service: General;  Laterality: Left;     SOCIAL HISTORY:  Social History   Socioeconomic History  . Marital status: Divorced    Spouse name: Not on file  . Number of children: Not on file  . Years of education: Not on file  . Highest education level: Not on file  Occupational History  . Not on file  Social Needs  . Financial resource strain: Not on file  . Food insecurity    Worry: Not on file    Inability: Not on file  . Transportation needs    Medical: No    Non-medical: No  Tobacco Use  . Smoking status:  Former Smoker    Years: 61.00    Quit date: 04/11/2014    Years since quitting: 4.8  . Smokeless tobacco: Never Used  . Tobacco comment: patient does vape smoking x 2 years  Substance and Sexual Activity  . Alcohol use: Not Currently    Comment: quit 10 years  . Drug use: No  . Sexual activity: Not on file  Lifestyle  . Physical activity    Days per week: Not on file    Minutes per session: Not on file  . Stress: Not on file  Relationships  . Social Herbalist on phone: Not on file    Gets together: Not on file    Attends religious service: Not on file    Active member of club or organization: Not on file    Attends meetings of clubs or organizations: Not on file    Relationship status: Not on file  . Intimate partner violence    Fear of current or ex partner: Not on file    Emotionally abused: Not on file    Physically abused: Not on file    Forced sexual activity: Not on file  Other Topics Concern  . Not on file  Social History Narrative  . Not on file  FAMILY HISTORY:  Family History  Problem Relation Age of Onset  . Stroke Mother   . Heart attack Father   . Heart attack Brother     CURRENT MEDICATIONS:  Outpatient Encounter Medications as of 02/04/2019  Medication Sig  . ALPRAZolam (XANAX) 1 MG tablet Take 1 mg by mouth 3 (three) times daily as needed for anxiety.  Marland Kitchen amLODipine (NORVASC) 2.5 MG tablet Take 2.5 mg by mouth daily.  Marland Kitchen CARBOPLATIN IV Inject into the vein every 21 ( twenty-one) days.  . folic acid (FOLVITE) 1 MG tablet Take 1 tablet (1 mg total) by mouth daily. Start 5-7 days before Alimta chemotherapy. Continue until 21 days after Alimta completed.  . furosemide (LASIX) 20 MG tablet Take 2 tablets (40 mg total) by mouth daily.  Marland Kitchen GLIPIZIDE XL 5 MG 24 hr tablet Take 5 mg by mouth daily.  . magnesium oxide (MAG-OX) 400 (241.3 Mg) MG tablet Take 1 tablet (400 mg total) by mouth 2 (two) times daily.  . metFORMIN (GLUCOPHAGE) 500 MG tablet  Take 500 mg by mouth 2 (two) times daily.  . metoprolol succinate (TOPROL-XL) 25 MG 24 hr tablet TAKE ONE TABLET BY MOUTH IN THE EVENING.  . Pembrolizumab (KEYTRUDA IV) Inject into the vein.  Marland Kitchen PEMEtrexed 500 mg/m2 in sodium chloride 0.9 % 100 mL Inject into the vein every 21 ( twenty-one) days.  . potassium chloride (K-DUR,KLOR-CON) 10 MEQ tablet   . simvastatin (ZOCOR) 20 MG tablet Take 20 mg by mouth every evening.  . torsemide (DEMADEX) 20 MG tablet Take 1 tablet (20 mg total) by mouth daily.  Marland Kitchen lidocaine-prilocaine (EMLA) cream APPLY A QUARTER SIZE AMOUNT TO THE AFFECTED AREA 1 HOUR PRIOR TO COMING TO CHEMOTHERAPY. COVER WITH A PLASTIC WRAP. (Patient not taking: Reported on 02/04/2019)  . ondansetron (ZOFRAN) 4 MG tablet Take 1 tablet (4 mg total) by mouth every 8 (eight) hours as needed for nausea or vomiting. (Patient not taking: Reported on 02/04/2019)  . oxyCODONE-acetaminophen (PERCOCET) 10-325 MG tablet   . prochlorperazine (COMPAZINE) 10 MG tablet Take 1 tablet (10 mg total) by mouth every 6 (six) hours as needed (Nausea or vomiting). (Patient not taking: Reported on 02/04/2019)   Facility-Administered Encounter Medications as of 02/04/2019  Medication  . influenza vaccine adjuvanted (FLUAD) injection 0.5 mL    ALLERGIES:  No Known Allergies   PHYSICAL EXAM:  ECOG Performance status: 1  Vitals:   02/04/19 1136  BP: 114/75  Pulse: (!) 106  Resp: 20  Temp: (!) 97.5 F (36.4 C)  SpO2: 98%   Filed Weights   02/04/19 1136  Weight: 202 lb (91.6 kg)    Physical Exam Vitals signs reviewed.  Constitutional:      Appearance: Normal appearance.  Cardiovascular:     Rate and Rhythm: Normal rate and regular rhythm.     Heart sounds: Normal heart sounds.  Pulmonary:     Effort: Pulmonary effort is normal.     Breath sounds: Normal breath sounds.  Abdominal:     General: There is no distension.     Palpations: Abdomen is soft. There is no mass.  Musculoskeletal:         General: No swelling.  Skin:    General: Skin is warm.  Neurological:     General: No focal deficit present.     Mental Status: He is alert and oriented to person, place, and time.  Psychiatric:        Mood and Affect: Mood normal.  Behavior: Behavior normal.    No skin rash seen at this time.  2+ edema bilaterally.  LABORATORY DATA:  I have reviewed the labs as listed.  CBC    Component Value Date/Time   WBC 9.1 02/04/2019 1018   RBC 3.12 (L) 02/04/2019 1018   HGB 9.0 (L) 02/04/2019 1018   HGB 13.5 05/02/2016 0852   HCT 28.9 (L) 02/04/2019 1018   HCT 41.0 05/02/2016 0852   PLT 172 02/04/2019 1018   PLT 436 (H) 05/02/2016 0852   MCV 92.6 02/04/2019 1018   MCV 85.7 05/02/2016 0852   MCH 28.8 02/04/2019 1018   MCHC 31.1 02/04/2019 1018   RDW 18.5 (H) 02/04/2019 1018   RDW 13.9 05/02/2016 0852   LYMPHSABS 1.3 02/04/2019 1018   LYMPHSABS 3.4 (H) 05/02/2016 0852   MONOABS 0.8 02/04/2019 1018   MONOABS 1.2 (H) 05/02/2016 0852   EOSABS 0.1 02/04/2019 1018   EOSABS 1.6 (H) 05/02/2016 0852   BASOSABS 0.1 02/04/2019 1018   BASOSABS 0.2 (H) 05/02/2016 0852   CMP Latest Ref Rng & Units 02/04/2019 01/27/2019 01/06/2019  Glucose 70 - 99 mg/dL 131(H) 126(H) 137(H)  BUN 8 - 23 mg/dL 37(H) 14 26(H)  Creatinine 0.61 - 1.24 mg/dL 1.37(H) 1.31(H) 1.28(H)  Sodium 135 - 145 mmol/L 142 143 139  Potassium 3.5 - 5.1 mmol/L 3.9 3.8 3.6  Chloride 98 - 111 mmol/L 104 102 104  CO2 22 - 32 mmol/L '24 26 25  '$ Calcium 8.9 - 10.3 mg/dL 8.9 6.7(L) 8.1(L)  Total Protein 6.5 - 8.1 g/dL 7.1 7.3 7.0  Total Bilirubin 0.3 - 1.2 mg/dL 0.9 0.6 0.5  Alkaline Phos 38 - 126 U/L 112 77 66  AST 15 - 41 U/L 39 22 15  ALT 0 - 44 U/L 40 21 23       DIAGNOSTIC IMAGING:  I have independently reviewed the scans and discussed with the patient.     ASSESSMENT & PLAN:   Primary cancer of right upper lobe of lung ([place]) 1.  Stage IV adenocarcinoma of the lung, PDL 1-50%, other actionable mutations  negative: - Keytruda from 05/17/2016 through 09/24/2018 with mild progression. -CT of the chest on 12/17/2018 shows posterior right upper lobe mass measured 4.1 x 3.2 cm, previously 5.2 x 3.9 cm.  Right paratracheal lymph node decreased from 12 mm to 9 mm.  No new areas were seen.  Right breast nodule also decreased in size. -3 cycles of carboplatin and pemetrexed from 12/25/2018 through 01/27/2019. - He did not get any rash after the last cycle.  He complained of leg swelling and erythema of the lower extremities. -I have done a Doppler of the lower extremities which was negative for DVT. -I have reviewed labs which are grossly within normal limits.  He will come back in 2 weeks for cycle 4 of chemotherapy.  2.  Brain metastasis: -Right parietal lobe 16.6 x 13.5 x 15.5 mm lesion on MRI dated 11/12/2018 with significant surrounding edema and mass-effect. -SRS to the brain lesion on 11/24/2018. - He has been tapered off of dexamethasone on 01/09/2019.  3.  Lower extremity swelling: -I have switched him to torsemide 20 mg as Lasix 20 mg twice daily is not helping.  4.  Hypomagnesemia: -Magnesium is 2.2 today.  He will continue magnesium 400 mg twice daily.  Total time spent is 25 minutes with more than 50% of the time spent face-to-face discussing treatment plan, counseling and coordination of care.    Orders placed this  encounter:  Orders Placed This Encounter  Procedures  . US Venous Img Lower Unilateral Left      Derek Jack, MD [place] [phone]

## 2019-02-04 NOTE — Patient Instructions (Addendum)
[facility] at [facility] Discharge Instructions  You were seen today by Dr. Katragadda. He went over your recent lab results. He will see you back in 2 weeks for labs and follow up.   Thank you for choosing [facility] at [facility] to provide your oncology and hematology care.  To afford each patient quality time with our provider, please arrive at least 15 minutes before your scheduled appointment time.   If you have a lab appointment with the Cancer Center please come in thru the  Main Entrance and check in at the main information desk  You need to re-schedule your appointment should you arrive 10 or more minutes late.  We strive to give you quality time with our providers, and arriving late affects you and other patients whose appointments are after yours.  Also, if you no show three or more times for appointments you may be dismissed from the clinic at the providers discretion.     Again, thank you for choosing [facility].  Our hope is that these requests will decrease the amount of time that you wait before being seen by our physicians.       _____________________________________________________________  Should you have questions after your visit to [facility], please contact our office at [phone] between the hours of 8:00 a.m. and 4:30 p.m.  Voicemails left after 4:00 p.m. will not be returned until the following business day.  For prescription refill requests, have your pharmacy contact our office and allow 72 hours.    Cancer Center Support Programs:   > Cancer Support Group  2nd Tuesday of the month 1pm-2pm, Journey Room    

## 2019-02-05 ENCOUNTER — Ambulatory Visit (HOSPITAL_COMMUNITY): Payer: Medicare Other

## 2019-02-05 DIAGNOSIS — R0602 Shortness of breath: Secondary | ICD-10-CM | POA: Diagnosis not present

## 2019-02-05 DIAGNOSIS — L03116 Cellulitis of left lower limb: Secondary | ICD-10-CM | POA: Diagnosis not present

## 2019-02-05 DIAGNOSIS — R601 Generalized edema: Secondary | ICD-10-CM | POA: Diagnosis not present

## 2019-02-05 NOTE — Progress Notes (Signed)
Nutrition  Called patient for nutrition follow-up.  No answer. Left message on voicemail with call back number.  Suhail Peloquin B. Zenia Resides, [place], [place] Registered Dietitian [phone] (pager)

## 2019-02-09 DIAGNOSIS — L03116 Cellulitis of left lower limb: Secondary | ICD-10-CM | POA: Diagnosis not present

## 2019-02-09 DIAGNOSIS — R601 Generalized edema: Secondary | ICD-10-CM | POA: Diagnosis not present

## 2019-02-17 ENCOUNTER — Other Ambulatory Visit: Payer: Self-pay

## 2019-02-17 ENCOUNTER — Inpatient Hospital Stay (HOSPITAL_COMMUNITY): Payer: Medicare Other

## 2019-02-17 ENCOUNTER — Inpatient Hospital Stay (HOSPITAL_BASED_OUTPATIENT_CLINIC_OR_DEPARTMENT_OTHER): Payer: Medicare Other | Admitting: Hematology

## 2019-02-17 ENCOUNTER — Encounter (HOSPITAL_COMMUNITY): Payer: Self-pay | Admitting: Hematology

## 2019-02-17 VITALS — BP 153/78 | HR 97 | Temp 96.9°F | Resp 18 | Wt 200.6 lb

## 2019-02-17 DIAGNOSIS — Z5111 Encounter for antineoplastic chemotherapy: Secondary | ICD-10-CM | POA: Diagnosis not present

## 2019-02-17 DIAGNOSIS — C3411 Malignant neoplasm of upper lobe, right bronchus or lung: Secondary | ICD-10-CM

## 2019-02-17 DIAGNOSIS — Z87891 Personal history of nicotine dependence: Secondary | ICD-10-CM | POA: Diagnosis not present

## 2019-02-17 DIAGNOSIS — Z79899 Other long term (current) drug therapy: Secondary | ICD-10-CM | POA: Diagnosis not present

## 2019-02-17 DIAGNOSIS — C7931 Secondary malignant neoplasm of brain: Secondary | ICD-10-CM | POA: Diagnosis not present

## 2019-02-17 DIAGNOSIS — Z7984 Long term (current) use of oral hypoglycemic drugs: Secondary | ICD-10-CM | POA: Diagnosis not present

## 2019-02-17 DIAGNOSIS — E119 Type 2 diabetes mellitus without complications: Secondary | ICD-10-CM | POA: Diagnosis not present

## 2019-02-17 DIAGNOSIS — I10 Essential (primary) hypertension: Secondary | ICD-10-CM | POA: Diagnosis not present

## 2019-02-17 DIAGNOSIS — M7989 Other specified soft tissue disorders: Secondary | ICD-10-CM | POA: Diagnosis not present

## 2019-02-17 DIAGNOSIS — Z5189 Encounter for other specified aftercare: Secondary | ICD-10-CM | POA: Diagnosis not present

## 2019-02-17 DIAGNOSIS — Z86711 Personal history of pulmonary embolism: Secondary | ICD-10-CM | POA: Diagnosis not present

## 2019-02-17 LAB — CBC WITH DIFFERENTIAL/PLATELET
Abs Immature Granulocytes: 0.25 10*3/uL — ABNORMAL HIGH (ref 0.00–0.07)
Basophils Absolute: 0.1 10*3/uL (ref 0.0–0.1)
Basophils Relative: 0 %
Eosinophils Absolute: 0.3 10*3/uL (ref 0.0–0.5)
Eosinophils Relative: 2 %
HCT: 30.9 % — ABNORMAL LOW (ref 39.0–52.0)
Hemoglobin: 9.6 g/dL — ABNORMAL LOW (ref 13.0–17.0)
Immature Granulocytes: 1 %
Lymphocytes Relative: 22 %
Lymphs Abs: 4.1 10*3/uL — ABNORMAL HIGH (ref 0.7–4.0)
MCH: 31.1 pg (ref 26.0–34.0)
MCHC: 31.1 g/dL (ref 30.0–36.0)
MCV: 100 fL (ref 80.0–100.0)
Monocytes Absolute: 1.6 10*3/uL — ABNORMAL HIGH (ref 0.1–1.0)
Monocytes Relative: 9 %
Neutro Abs: 11.9 10*3/uL — ABNORMAL HIGH (ref 1.7–7.7)
Neutrophils Relative %: 66 %
Platelets: 294 10*3/uL (ref 150–400)
RBC: 3.09 MIL/uL — ABNORMAL LOW (ref 4.22–5.81)
RDW: 23.5 % — ABNORMAL HIGH (ref 11.5–15.5)
WBC: 18.2 10*3/uL — ABNORMAL HIGH (ref 4.0–10.5)
nRBC: 0 % (ref 0.0–0.2)

## 2019-02-17 LAB — COMPREHENSIVE METABOLIC PANEL
ALT: 46 U/L — ABNORMAL HIGH (ref 0–44)
AST: 30 U/L (ref 15–41)
Albumin: 4 g/dL (ref 3.5–5.0)
Alkaline Phosphatase: 91 U/L (ref 38–126)
Anion gap: 10 (ref 5–15)
BUN: 20 mg/dL (ref 8–23)
CO2: 23 mmol/L (ref 22–32)
Calcium: 9 mg/dL (ref 8.9–10.3)
Chloride: 109 mmol/L (ref 98–111)
Creatinine, Ser: 1.65 mg/dL — ABNORMAL HIGH (ref 0.61–1.24)
GFR calc Af Amer: 47 mL/min — ABNORMAL LOW (ref >60)
GFR calc non Af Amer: 40 mL/min — ABNORMAL LOW (ref >60)
Glucose, Bld: 96 mg/dL (ref 70–99)
Potassium: 4.6 mmol/L (ref 3.5–5.1)
Sodium: 142 mmol/L (ref 135–145)
Total Bilirubin: 0.4 mg/dL (ref 0.3–1.2)
Total Protein: 7.3 g/dL (ref 6.5–8.1)

## 2019-02-17 LAB — MAGNESIUM: Magnesium: 2.4 mg/dL (ref 1.7–2.4)

## 2019-02-17 NOTE — Progress Notes (Signed)
[address] [place], [place] [address]   CLINIC:  Medical Oncology/Hematology  PCP:  Celene Squibb, MD [place] [address] [phone]   REASON FOR VISIT:  Follow-up for stage IV adenocarcinoma of the right lung   BRIEF ONCOLOGIC HISTORY:  Oncology History  Primary cancer of right upper lobe of lung ([place])  05/14/2015 Procedure   Port placement by Dr. Arnoldo Morale   04/01/2016 Initial Diagnosis   Primary cancer of right upper lobe of lung ([place])   04/02/2016 Procedure   Ultrasound-guided core biopsy performed of a solid 1.5 cm soft tissue nodule in the right chest wall.   04/03/2016 Imaging   MRI brain- No acute and intracranial process or metastasis.  Moderate chronic small vessel ischemic disease.   04/04/2016 Pathology Results   Diagnosis Soft Tissue Needle Core Biopsy, Right Chest Wall SMOOTH MUSCLE NEOPLASM Microscopic Comment The differential diagnosis include low grade leiomyosarcoma. The neoplasm shows spindle cell morphology with rare mitotic figures and minimal atypia, no necrosis present. These cells stains positive for smooth muscle actin, desmin, negative for cytokeratin AE1&3, ck8/18, s100, cd117, cd 34 and cd99. This case also reviewed by Dr. [name] and agree.   04/05/2016 Procedure   CT-guided biopsy of right upper lobe lesion, with tissue specimen sent to pathology, by IR.   04/09/2016 Pathology Results   Lung, needle/core biopsy(ies), Right Upper Lobe - NON SMALL CELL CARCINOMA.   04/17/2016 Pathology Results   PDL1 High Expression.  TPS 50%.   04/24/2016 PET scan   1. Prominently hypermetabolic large right upper lobe mass with metastatic disease to the right paratracheal and right hilar nodal chains, to the descending mesocolon, to the left upper buttock subcutaneous tissues, to the left upper lobe, to the right subscapularis muscle, and possibly to the left adrenal gland and right breast  subcutaneous tissues. Appearance compatible with stage IV lung cancer. 2. There is some focal high activity in the ascending colon which is probably from peristalsis, less likely from a local colon mass. This does raise the possibility that the adjacent mesocolon tumor implant could be due to synchronous colon cancer rather than lung metastasis. 3. Other imaging findings of potential clinical significance: Coronary, aortic arch, and branch vessel atherosclerotic vascular disease. Aortoiliac atherosclerotic vascular disease. Deformity left proximal humerus and left glenohumeral joint from prior trauma. Enlarged prostate gland.   04/26/2016 Pathology Results   FoundationONE: Genomic alterations identified- KRAS B63S, CREBBP splice site [id number], LRP1B S515*, JGO11 X72*, IOM35 splice site [id number], SPTA1 splice site [id number], TP53 C135Y.  Additional findings- MS-Stable, TMB-intermediate (14 Muts/Mb).  No reportable alterations- EGFR, ALK, BRAF, MET, ERBB2, RET, ROS1.   05/17/2016 -  Chemotherapy   The patient had ONDANSETRON IVPB [facility] +/- DEXAMETHASONE, , Intravenous,  Once, 0 of 1 cycle  pembrolizumab (KEYTRUDA) 200 mg in sodium chloride 0.9 % 50 mL chemo infusion, 200 mg, Intravenous, Once, 0 of 5 cycles  for chemotherapy treatment.     08/06/2016 PET scan   IMPRESSION: 1. Overall considerable improvement. The right upper lobe mass is markedly reduced in volume and SUV. Thoracic adenopathy have resolved and the hypermetabolic lesion in the right subscapularis muscle has also resolved. 2. The left upper lobe nodule is no longer appreciably hypermetabolic, and is highly indistinct although this may be due to motion artifact. Faint in indistinct nodularity elsewhere in the lungs likewise not currently hypermetabolic. 3. There is some very faint residual low-grade activity along the subcutaneous deposit  along the upper left buttock region. Marked reduction in size and activity of  the tumor deposit adjacent to the descending colon. 4. Prior right pleural effusion has resolved. 5. Stable appearance of the adrenal glands, with low-level activity and a nodule in the left adrenal gland. 6. Stable small nodule in the right breast, low-grade metabolic activity with maximum SUV 2.2 (formerly 3.0) 7. Other imaging findings of potential clinical significance: Severe arthropathy of the left glenohumeral joint. Coronary, aortic arch, and branch vessel atherosclerotic vascular disease. Aortoiliac atherosclerotic vascular disease. Prominent prostate gland indents the bladder base.    12/16/2018 -  Chemotherapy   The patient had palonosetron (ALOXI) injection 0.25 mg, 0.25 mg, Intravenous,  Once, 3 of 4 cycles Administration: 0.25 mg (12/16/2018), 0.25 mg (01/27/2019), 0.25 mg (01/06/2019) pegfilgrastim (NEULASTA) injection 6 mg, 6 mg, Subcutaneous, Once, 2 of 2 cycles Administration: 6 mg (01/08/2019) pegfilgrastim (NEULASTA ONPRO KIT) injection 6 mg, 6 mg, Subcutaneous, Once, 2 of 2 cycles pegfilgrastim-cbqv (UDENYCA) injection 6 mg, 6 mg, Subcutaneous, Once, 1 of 4 cycles Administration: 6 mg (01/29/2019) PEMEtrexed (ALIMTA) 1,100 mg in sodium chloride 0.9 % 100 mL chemo infusion, 490 mg/m2 = 1,125 mg, Intravenous,  Once, 3 of 6 cycles Dose modification: 400 mg/m2 (80 % of original dose 500 mg/m2, Cycle 3, Reason: Other (see comments), Comment: rash, swelling) Administration: 1,100 mg (12/16/2018), 900 mg (01/27/2019), 1,100 mg (01/06/2019) CARBOplatin (PARAPLATIN) 500 mg in sodium chloride 0.9 % 250 mL chemo infusion, 530 mg (110.7 % of original dose 477.5 mg), Intravenous,  Once, 3 of 4 cycles Dose modification:   (original dose 477.5 mg, Cycle 1),   (original dose 485.5 mg, Cycle 3),   (original dose 494 mg, Cycle 2) Administration: 500 mg (12/16/2018), 490 mg (01/27/2019), 490 mg (01/06/2019) fosaprepitant (EMEND) 150 mg, dexamethasone (DECADRON) 12 mg in sodium chloride 0.9 % 145 mL  IVPB, , Intravenous,  Once, 3 of 4 cycles Administration:  (12/16/2018),  (01/27/2019),  (01/06/2019)  for chemotherapy treatment.    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  Two adjacent large masslike areas of consolidation within the right upper lobe with differential considerations including malignancy or pneumonia. Multiple enlarged right hilar and mediastinal lymph nodes which may be reactive or metastatic in etiology.  Additional indeterminate pulmonary nodules as above. Recommend attention on follow-up.  Indeterminate 2.9 cm left adrenal nodule. This needs dedicated evaluation with pre and post contrast-enhanced CT or MRI after confirmation of pulmonary process.  Indeterminate nodule within the right breast. Recommend dedicated evaluation with mammography.  Hepatic steatosis.   04/02/2016 Procedure   Ultrasound-guided core biopsy performed of a solid 1.5 cm soft tissue nodule in the right chest wall.   04/04/2016 Pathology Results   Diagnosis Soft Tissue Needle Core Biopsy, Right Chest Wall SMOOTH MUSCLE NEOPLASM Microscopic Comment The differential diagnosis include low grade leiomyosarcoma. The neoplasm shows spindle cell morphology with rare mitotic figures and minimal atypia, no necrosis present. These cells stains positive for smooth muscle actin, desmin, negative for cytokeratin AE1&3, ck8/18, s100, cd117, cd 34 and cd99. This case also reviewed by Dr. [name] and agree.       CANCER STAGING: Cancer Staging Primary cancer of right upper lobe of lung ([place]) Staging form: Lung, AJCC 8th Edition - Clinical: Stage IVB (cT3, cN2, cM1c) - Signed by Baird Cancer, PA-C on 05/07/2016    INTERVAL HISTORY:  Mr.  Mullinix 73 y.o. male seen for follow-up and toxicity assessment prior to cycle 4 of chemotherapy.  He received cycle 3 on  01/27/2019.  He is taking Lasix 20 mg 1 to 2 tablets daily.  He was reportedly treated with antibiotics for his lower extremity cellulitis by Dr. [name].  He has some shortness of breath on activity which is stable.  Leg swellings have improved quite a bit.  Occasional numbness is stable.  Lower back pain is reported as 7 out of 10.  Denies any fevers or chills.  Denies any nausea or vomiting.  REVIEW OF SYSTEMS:  Review of Systems  Respiratory: Positive for shortness of breath.   Cardiovascular: Positive for leg swelling.  Gastrointestinal: Positive for constipation.  Neurological: Positive for numbness.  All other systems reviewed and are negative.    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[place])    Past Surgical History:  Procedure Laterality Date   COLONOSCOPY N/A 07/28/2017   Procedure: COLONOSCOPY;  Surgeon: Rogene Houston, MD;  Location: [facility] ENDO SUITE;  Service: Endoscopy;  Laterality: N/A;  [place] Left 05/13/2016   Procedure: INSERTION PORT-A-CATH;  Surgeon: Aviva Signs, MD;  Location: [facility] ORS;  Service: General;  Laterality: Left;     SOCIAL HISTORY:  Social History   Socioeconomic History   Marital status: Divorced    Spouse name: Not on file   Number of children: Not on file   Years of education: Not on file   Highest education level: Not on file  Occupational History   Not on file  Social Needs   Financial resource strain: Not on file   Food insecurity    Worry: Not on  file    Inability: Not on file   Transportation needs    Medical: No    Non-medical: No  Tobacco Use   Smoking status: Former Smoker    Years: 61.00    Quit date: 04/11/2014    Years since quitting: 4.8   Smokeless tobacco: Never Used   Tobacco comment: patient does vape smoking x 2 years  Substance and Sexual Activity   Alcohol use: Not Currently    Comment: quit 10 years   Drug use: No   Sexual activity: Not on file  Lifestyle   Physical activity    Days per week: Not on file    Minutes per session: Not on file   Stress: Not on file  Relationships   Social connections    Talks on phone: Not on file    Gets together: Not on file    Attends religious service: Not on file    Active member of club or organization: Not on file    Attends meetings of clubs or organizations: Not on file    Relationship status: Not on file   Intimate partner violence    Fear of current or ex partner: Not on file    Emotionally abused: Not on file    Physically abused:  Not on file    Forced sexual activity: Not on file  Other Topics Concern   Not on file  Social History Narrative   Not on file    FAMILY HISTORY:  Family History  Problem Relation Age of Onset   Stroke Mother    Heart attack Father    Heart attack Brother     CURRENT MEDICATIONS:  Outpatient Encounter Medications as of 02/17/2019  Medication Sig   ALPRAZolam (XANAX) 1 MG tablet Take 1 mg by mouth 3 (three) times daily as needed for anxiety.   amLODipine (NORVASC) 2.5 MG tablet Take 2.5 mg by mouth daily.   CARBOPLATIN IV Inject into the vein every 21 ( twenty-one) days.   folic acid (FOLVITE) 1 MG tablet Take 1 tablet (1 mg total) by mouth daily. Start 5-7 days before Alimta chemotherapy. Continue until 21 days after Alimta completed.   furosemide (LASIX) 20 MG tablet Take 2 tablets (40 mg total) by mouth daily.   GLIPIZIDE XL 5 MG 24 hr tablet Take 5 mg by mouth daily.   hydrocortisone 2.5 %  lotion    lidocaine-prilocaine (EMLA) cream APPLY A QUARTER SIZE AMOUNT TO THE AFFECTED AREA 1 HOUR PRIOR TO COMING TO CHEMOTHERAPY. COVER WITH A PLASTIC WRAP.   magnesium oxide (MAG-OX) 400 (241.3 Mg) MG tablet Take 1 tablet (400 mg total) by mouth 2 (two) times daily.   metFORMIN (GLUCOPHAGE) 500 MG tablet Take 500 mg by mouth 2 (two) times daily.   metoprolol succinate (TOPROL-XL) 25 MG 24 hr tablet TAKE ONE TABLET BY MOUTH IN THE EVENING.   oxyCODONE-acetaminophen (PERCOCET) 10-325 MG tablet    Pembrolizumab (KEYTRUDA IV) Inject into the vein.   PEMEtrexed 500 mg/m2 in sodium chloride 0.9 % 100 mL Inject into the vein every 21 ( twenty-one) days.   potassium chloride (K-DUR,KLOR-CON) 10 MEQ tablet    simvastatin (ZOCOR) 20 MG tablet Take 20 mg by mouth every evening.   torsemide (DEMADEX) 20 MG tablet Take 1 tablet (20 mg total) by mouth daily.   ondansetron (ZOFRAN) 4 MG tablet Take 1 tablet (4 mg total) by mouth every 8 (eight) hours as needed for nausea or vomiting. (Patient not taking: Reported on 02/17/2019)   prochlorperazine (COMPAZINE) 10 MG tablet Take 1 tablet (10 mg total) by mouth every 6 (six) hours as needed (Nausea or vomiting). (Patient not taking: Reported on 02/04/2019)   [DISCONTINUED] predniSONE (DELTASONE) 10 MG tablet    Facility-Administered Encounter Medications as of 02/17/2019  Medication   influenza vaccine adjuvanted (FLUAD) injection 0.5 mL    ALLERGIES:  No Known Allergies   PHYSICAL EXAM:  ECOG Performance status: 1  Vitals:   02/17/19 0944  BP: (!) 153/78  Pulse: 97  Resp: 18  Temp: (!) 96.9 F (36.1 C)  SpO2: 98%   Filed Weights   02/17/19 0944  Weight: 200 lb 9.6 oz (91 kg)    Physical Exam Vitals signs reviewed.  Constitutional:      Appearance: Normal appearance.  Cardiovascular:     Rate and Rhythm: Normal rate and regular rhythm.     Heart sounds: Normal heart sounds.  Pulmonary:     Effort: Pulmonary effort is  normal.     Breath sounds: Normal breath sounds.  Abdominal:     General: There is no distension.     Palpations: Abdomen is soft. There is no mass.  Musculoskeletal:        General: No swelling.  Skin:  General: Skin is warm.  Neurological:     General: No focal deficit present.     Mental Status: He is alert and oriented to person, place, and time.  Psychiatric:        Mood and Affect: Mood normal.        Behavior: Behavior normal.    No edema in the lower extremities.  LABORATORY DATA:  I have reviewed the labs as listed.  CBC    Component Value Date/Time   WBC 18.2 (H) 02/17/2019 0915   RBC 3.09 (L) 02/17/2019 0915   HGB 9.6 (L) 02/17/2019 0915   HGB 13.5 05/02/2016 0852   HCT 30.9 (L) 02/17/2019 0915   HCT 41.0 05/02/2016 0852   PLT 294 02/17/2019 0915   PLT 436 (H) 05/02/2016 0852   MCV 100.0 02/17/2019 0915   MCV 85.7 05/02/2016 0852   MCH 31.1 02/17/2019 0915   MCHC 31.1 02/17/2019 0915   RDW 23.5 (H) 02/17/2019 0915   RDW 13.9 05/02/2016 0852   LYMPHSABS 4.1 (H) 02/17/2019 0915   LYMPHSABS 3.4 (H) 05/02/2016 0852   MONOABS 1.6 (H) 02/17/2019 0915   MONOABS 1.2 (H) 05/02/2016 0852   EOSABS 0.3 02/17/2019 0915   EOSABS 1.6 (H) 05/02/2016 0852   BASOSABS 0.1 02/17/2019 0915   BASOSABS 0.2 (H) 05/02/2016 0852   CMP Latest Ref Rng & Units 02/17/2019 02/04/2019 01/27/2019  Glucose 70 - 99 mg/dL 96 131(H) 126(H)  BUN 8 - 23 mg/dL 20 37(H) 14  Creatinine 0.61 - 1.24 mg/dL 1.65(H) 1.37(H) 1.31(H)  Sodium 135 - 145 mmol/L 142 142 143  Potassium 3.5 - 5.1 mmol/L 4.6 3.9 3.8  Chloride 98 - 111 mmol/L 109 104 102  CO2 22 - 32 mmol/L '23 24 26  ' Calcium 8.9 - 10.3 mg/dL 9.0 8.9 6.7(L)  Total Protein 6.5 - 8.1 g/dL 7.3 7.1 7.3  Total Bilirubin 0.3 - 1.2 mg/dL 0.4 0.9 0.6  Alkaline Phos 38 - 126 U/L 91 112 77  AST 15 - 41 U/L 30 39 22  ALT 0 - 44 U/L 46(H) 40 21       DIAGNOSTIC IMAGING:  I have independently reviewed the scans and discussed with the  patient.     ASSESSMENT & PLAN:   Primary cancer of right upper lobe of lung ([place]) 1.  Stage IV adenocarcinoma of the lung, PDL 1-50%, other actionable mutations negative: - Keytruda from 05/17/2016 through 09/24/2018 with mild progression. -CT of the chest on 12/17/2018 shows posterior right upper lobe mass measured 4.1 x 3.2 cm, previously 5.2 x 3.9 cm.  Right paratracheal lymph node decreased from 12 mm to 9 mm.  No new areas were seen.  Right breast nodule also decreased in size. -3 cycles of carboplatin and pemetrexed from 12/25/2018 through 01/27/2019. - He did not get any rash after last cycle of therapy.  However he had some cellulitis in the lower extremities for which he was treated with antibiotics by Dr. [name]. -His creatinine today is 1.6, up from his baseline of 1.3. - We will hold off on his treatment today until the creatinine comes down to his baseline.  He will come back in 7 to 10 days to repeat labs and proceed with cycle 4. -After that treatment, we will repeat scans.  We will consider maintenance pemetrexed and pembrolizumab or single agent pembrolizumab at that time.  2.  Brain metastasis: -Right parietal lobe 16.6 x 13.5 x 15.5 mm lesion on MRI dated 11/12/2018 with significant surrounding  edema and mass-effect. -SRS to the brain lesion on 11/24/2018. - He has been tapered off of dexamethasone on 01/09/2019.  3.  Lower extremity swelling: -He is reportedly taking Lasix 20 mg 1 to 2 tablets daily as needed.  4.  Hypomagnesemia: -He will continue magnesium twice daily.  Total time spent is 25 minutes with more than 50% of the time spent face-to-face discussing treatment plan, counseling and coordination of care.    Orders placed this encounter:  Orders Placed This Encounter  Procedures   CBC with Differential/Platelet   Comprehensive metabolic panel   Magnesium      Derek Jack, MD [place] [phone]

## 2019-02-17 NOTE — Patient Instructions (Addendum)
[facility] at [facility] Discharge Instructions  You were seen today by Dr. Katragadda. He went over your recent lab results. He will see you back in 2 weeks for labs, treatment and follow up.   Thank you for choosing [facility] at [facility] to provide your oncology and hematology care.  To afford each patient quality time with our provider, please arrive at least 15 minutes before your scheduled appointment time.   If you have a lab appointment with the Cancer Center please come in thru the  Main Entrance and check in at the main information desk  You need to re-schedule your appointment should you arrive 10 or more minutes late.  We strive to give you quality time with our providers, and arriving late affects you and other patients whose appointments are after yours.  Also, if you no show three or more times for appointments you may be dismissed from the clinic at the providers discretion.     Again, thank you for choosing [facility].  Our hope is that these requests will decrease the amount of time that you wait before being seen by our physicians.       _____________________________________________________________  Should you have questions after your visit to [facility], please contact our office at [phone] between the hours of 8:00 a.m. and 4:30 p.m.  Voicemails left after 4:00 p.m. will not be returned until the following business day.  For prescription refill requests, have your pharmacy contact our office and allow 72 hours.    Cancer Center Support Programs:   > Cancer Support Group  2nd Tuesday of the month 1pm-2pm, Journey Room    

## 2019-02-17 NOTE — Assessment & Plan Note (Signed)
1.  Stage IV adenocarcinoma of the lung, PDL 1-50%, other actionable mutations negative: - Keytruda from 05/17/2016 through 09/24/2018 with mild progression. -CT of the chest on 12/17/2018 shows posterior right upper lobe mass measured 4.1 x 3.2 cm, previously 5.2 x 3.9 cm.  Right paratracheal lymph node decreased from 12 mm to 9 mm.  No new areas were seen.  Right breast nodule also decreased in size. -3 cycles of carboplatin and pemetrexed from 12/25/2018 through 01/27/2019. - He did not get any rash after the last cycle.  He complained of leg swelling and erythema of the lower extremities. -I have done a Doppler of the lower extremities which was negative for DVT. -I have reviewed labs which are grossly within normal limits.  He will come back in 2 weeks for cycle 4 of chemotherapy.  2.  Brain metastasis: -Right parietal lobe 16.6 x 13.5 x 15.5 mm lesion on MRI dated 11/12/2018 with significant surrounding edema and mass-effect. -SRS to the brain lesion on 11/24/2018. - He has been tapered off of dexamethasone on 01/09/2019.  3.  Lower extremity swelling: -I have switched him to torsemide 20 mg as Lasix 20 mg twice daily is not helping.  4.  Hypomagnesemia: -Magnesium is 2.2 today.  He will continue magnesium 400 mg twice daily.

## 2019-02-17 NOTE — Assessment & Plan Note (Signed)
1.  Stage IV adenocarcinoma of the lung, PDL 1-50%, other actionable mutations negative: - Keytruda from 05/17/2016 through 09/24/2018 with mild progression. -CT of the chest on 12/17/2018 shows posterior right upper lobe mass measured 4.1 x 3.2 cm, previously 5.2 x 3.9 cm.  Right paratracheal lymph node decreased from 12 mm to 9 mm.  No new areas were seen.  Right breast nodule also decreased in size. -3 cycles of carboplatin and pemetrexed from 12/25/2018 through 01/27/2019. - He did not get any rash after last cycle of therapy.  However he had some cellulitis in the lower extremities for which he was treated with antibiotics by Dr. [name]. -His creatinine today is 1.6, up from his baseline of 1.3. - We will hold off on his treatment today until the creatinine comes down to his baseline.  He will come back in 7 to 10 days to repeat labs and proceed with cycle 4. -After that treatment, we will repeat scans.  We will consider maintenance pemetrexed and pembrolizumab or single agent pembrolizumab at that time.  2.  Brain metastasis: -Right parietal lobe 16.6 x 13.5 x 15.5 mm lesion on MRI dated 11/12/2018 with significant surrounding edema and mass-effect. -SRS to the brain lesion on 11/24/2018. - He has been tapered off of dexamethasone on 01/09/2019.  3.  Lower extremity swelling: -He is reportedly taking Lasix 20 mg 1 to 2 tablets daily as needed.  4.  Hypomagnesemia: -He will continue magnesium twice daily.

## 2019-02-17 NOTE — Progress Notes (Signed)
No treatment today per MD. Will hold till next week.

## 2019-02-19 ENCOUNTER — Ambulatory Visit (HOSPITAL_COMMUNITY): Payer: Medicare Other

## 2019-02-19 ENCOUNTER — Other Ambulatory Visit: Payer: Self-pay | Admitting: Radiation Therapy

## 2019-02-19 DIAGNOSIS — C7949 Secondary malignant neoplasm of other parts of nervous system: Secondary | ICD-10-CM

## 2019-02-19 DIAGNOSIS — C7931 Secondary malignant neoplasm of brain: Secondary | ICD-10-CM

## 2019-02-23 ENCOUNTER — Other Ambulatory Visit: Payer: Self-pay | Admitting: Radiation Therapy

## 2019-03-03 ENCOUNTER — Inpatient Hospital Stay (HOSPITAL_COMMUNITY): Payer: Medicare Other | Admitting: Hematology

## 2019-03-03 ENCOUNTER — Inpatient Hospital Stay (HOSPITAL_COMMUNITY): Payer: Medicare Other

## 2019-03-03 ENCOUNTER — Other Ambulatory Visit: Payer: Self-pay

## 2019-03-03 ENCOUNTER — Encounter (HOSPITAL_COMMUNITY): Payer: Self-pay | Admitting: Hematology

## 2019-03-03 VITALS — BP 149/80 | HR 95 | Temp 97.1°F | Resp 20

## 2019-03-03 VITALS — BP 140/60 | HR 56 | Temp 97.3°F | Resp 20 | Wt 205.0 lb

## 2019-03-03 DIAGNOSIS — C7931 Secondary malignant neoplasm of brain: Secondary | ICD-10-CM | POA: Diagnosis not present

## 2019-03-03 DIAGNOSIS — M7989 Other specified soft tissue disorders: Secondary | ICD-10-CM | POA: Diagnosis not present

## 2019-03-03 DIAGNOSIS — Z86711 Personal history of pulmonary embolism: Secondary | ICD-10-CM | POA: Diagnosis not present

## 2019-03-03 DIAGNOSIS — C3411 Malignant neoplasm of upper lobe, right bronchus or lung: Secondary | ICD-10-CM

## 2019-03-03 DIAGNOSIS — Z5189 Encounter for other specified aftercare: Secondary | ICD-10-CM | POA: Diagnosis not present

## 2019-03-03 DIAGNOSIS — Z79899 Other long term (current) drug therapy: Secondary | ICD-10-CM | POA: Diagnosis not present

## 2019-03-03 DIAGNOSIS — Z87891 Personal history of nicotine dependence: Secondary | ICD-10-CM | POA: Diagnosis not present

## 2019-03-03 DIAGNOSIS — E119 Type 2 diabetes mellitus without complications: Secondary | ICD-10-CM | POA: Diagnosis not present

## 2019-03-03 DIAGNOSIS — I10 Essential (primary) hypertension: Secondary | ICD-10-CM | POA: Diagnosis not present

## 2019-03-03 DIAGNOSIS — Z5111 Encounter for antineoplastic chemotherapy: Secondary | ICD-10-CM | POA: Diagnosis not present

## 2019-03-03 DIAGNOSIS — Z7984 Long term (current) use of oral hypoglycemic drugs: Secondary | ICD-10-CM | POA: Diagnosis not present

## 2019-03-03 LAB — CBC WITH DIFFERENTIAL/PLATELET
Abs Immature Granulocytes: 0.04 10*3/uL (ref 0.00–0.07)
Basophils Absolute: 0.1 10*3/uL (ref 0.0–0.1)
Basophils Relative: 1 %
Eosinophils Absolute: 0.3 10*3/uL (ref 0.0–0.5)
Eosinophils Relative: 3 %
HCT: 34.1 % — ABNORMAL LOW (ref 39.0–52.0)
Hemoglobin: 10.6 g/dL — ABNORMAL LOW (ref 13.0–17.0)
Immature Granulocytes: 0 %
Lymphocytes Relative: 33 %
Lymphs Abs: 3.8 10*3/uL (ref 0.7–4.0)
MCH: 32.2 pg (ref 26.0–34.0)
MCHC: 31.1 g/dL (ref 30.0–36.0)
MCV: 103.6 fL — ABNORMAL HIGH (ref 80.0–100.0)
Monocytes Absolute: 1 10*3/uL (ref 0.1–1.0)
Monocytes Relative: 9 %
Neutro Abs: 6.3 10*3/uL (ref 1.7–7.7)
Neutrophils Relative %: 54 %
Platelets: 261 10*3/uL (ref 150–400)
RBC: 3.29 MIL/uL — ABNORMAL LOW (ref 4.22–5.81)
RDW: 20.7 % — ABNORMAL HIGH (ref 11.5–15.5)
WBC: 11.5 10*3/uL — ABNORMAL HIGH (ref 4.0–10.5)
nRBC: 0 % (ref 0.0–0.2)

## 2019-03-03 LAB — COMPREHENSIVE METABOLIC PANEL
ALT: 29 U/L (ref 0–44)
AST: 26 U/L (ref 15–41)
Albumin: 4 g/dL (ref 3.5–5.0)
Alkaline Phosphatase: 78 U/L (ref 38–126)
Anion gap: 13 (ref 5–15)
BUN: 16 mg/dL (ref 8–23)
CO2: 24 mmol/L (ref 22–32)
Calcium: 9.4 mg/dL (ref 8.9–10.3)
Chloride: 105 mmol/L (ref 98–111)
Creatinine, Ser: 1.28 mg/dL — ABNORMAL HIGH (ref 0.61–1.24)
GFR calc Af Amer: >60 mL/min (ref >60)
GFR calc non Af Amer: 55 mL/min — ABNORMAL LOW (ref >60)
Glucose, Bld: 120 mg/dL — ABNORMAL HIGH (ref 70–99)
Potassium: 3.6 mmol/L (ref 3.5–5.1)
Sodium: 142 mmol/L (ref 135–145)
Total Bilirubin: 0.5 mg/dL (ref 0.3–1.2)
Total Protein: 7.3 g/dL (ref 6.5–8.1)

## 2019-03-03 LAB — MAGNESIUM: Magnesium: 2 mg/dL (ref 1.7–2.4)

## 2019-03-03 MED ORDER — PALONOSETRON HCL INJECTION 0.25 MG/5ML
0.2500 mg | Freq: Once | INTRAVENOUS | Status: AC
  Administered 2019-03-03: 0.25 mg via INTRAVENOUS
  Filled 2019-03-03: qty 5

## 2019-03-03 MED ORDER — SODIUM CHLORIDE 0.9 % IV SOLN
Freq: Once | INTRAVENOUS | Status: AC
  Administered 2019-03-03: 10:00:00 via INTRAVENOUS
  Filled 2019-03-03: qty 5

## 2019-03-03 MED ORDER — HEPARIN SOD (PORK) LOCK FLUSH 100 UNIT/ML IV SOLN
500.0000 [IU] | Freq: Once | INTRAVENOUS | Status: AC | PRN
  Administered 2019-03-03: 12:00:00 500 [IU]

## 2019-03-03 MED ORDER — SODIUM CHLORIDE 0.9% FLUSH
10.0000 mL | INTRAVENOUS | Status: DC | PRN
  Administered 2019-03-03: 10 mL
  Filled 2019-03-03: qty 10

## 2019-03-03 MED ORDER — SODIUM CHLORIDE 0.9 % IV SOLN
400.0000 mg/m2 | Freq: Once | INTRAVENOUS | Status: AC
  Administered 2019-03-03: 900 mg via INTRAVENOUS
  Filled 2019-03-03: qty 16

## 2019-03-03 MED ORDER — SODIUM CHLORIDE 0.9 % IV SOLN
Freq: Once | INTRAVENOUS | Status: AC
  Administered 2019-03-03: 10:00:00 via INTRAVENOUS

## 2019-03-03 MED ORDER — SODIUM CHLORIDE 0.9 % IV SOLN
494.0000 mg | Freq: Once | INTRAVENOUS | Status: AC
  Administered 2019-03-03: 490 mg via INTRAVENOUS
  Filled 2019-03-03: qty 49

## 2019-03-03 NOTE — Patient Instructions (Signed)
[facility] Discharge Instructions for Patients Receiving Chemotherapy  Today you received the following chemotherapy agents   To help prevent nausea and vomiting after your treatment, we encourage you to take your nausea medication   If you develop nausea and vomiting that is not controlled by your nausea medication, call the clinic.   BELOW ARE SYMPTOMS THAT SHOULD BE REPORTED IMMEDIATELY:  *FEVER GREATER THAN 100.5 F  *CHILLS WITH OR WITHOUT FEVER  NAUSEA AND VOMITING THAT IS NOT CONTROLLED WITH YOUR NAUSEA MEDICATION  *UNUSUAL SHORTNESS OF BREATH  *UNUSUAL BRUISING OR BLEEDING  TENDERNESS IN MOUTH AND THROAT WITH OR WITHOUT PRESENCE OF ULCERS  *URINARY PROBLEMS  *BOWEL PROBLEMS  UNUSUAL RASH Items with * indicate a potential emergency and should be followed up as soon as possible.  Feel free to call the clinic should you have any questions or concerns. The clinic phone number is [phone].  Please show the CHEMO ALERT CARD at check-in to the Emergency Department and triage nurse.   

## 2019-03-03 NOTE — Assessment & Plan Note (Signed)
1.  Stage IV adenocarcinoma of the lung, PDL 1-50%, other actionable mutations negative: - Keytruda from 05/17/2016 through 09/24/2018 with mild progression. -CT of the chest on 12/17/2018 shows posterior right upper lobe mass measured 4.1 x 3.2 cm, previously 5.2 x 3.9 cm.  Right paratracheal lymph node decreased from 12 mm to 9 mm.  No new areas were seen.  Right breast nodule also decreased in size. -3 cycles of carboplatin and pemetrexed from 12/25/2018 through 01/27/2019. -I have reviewed his labs.  Creatinine improved to 1.2. -He may proceed with cycle 4 chemotherapy today.  I plan to arrange for CT scans prior to next visit. -We will consider maintenance chemotherapy with pemetrexed and pembrolizumab or single agent pembrolizumab at that point.  2.  Brain metastasis: -Right parietal lobe 16.6 x 13.5 x 15.5 mm lesion on MRI dated 11/12/2018 with significant surrounding edema and mass-effect. -SRS to the brain lesion on 11/24/2018. - He has been tapered off of dexamethasone on 01/09/2019.  3.  Lower extremity swelling: -He is taking Lasix 20 mg 2 tablets daily.  4.  Hypomagnesemia: -He will continue magnesium twice daily.

## 2019-03-03 NOTE — Patient Instructions (Signed)
[facility] Discharge Instructions  You were seen today by Dr. Katragadda. He went over your recent lab results. He will see you back in 3 weeks for labs and follow up.   Thank you for choosing  Cancer Center at [facility] to provide your oncology and hematology care.  To afford each patient quality time with our provider, please arrive at least 15 minutes before your scheduled appointment time.   If you have a lab appointment with the Cancer Center please come in thru the  Main Entrance and check in at the main information desk  You need to re-schedule your appointment should you arrive 10 or more minutes late.  We strive to give you quality time with our providers, and arriving late affects you and other patients whose appointments are after yours.  Also, if you no show three or more times for appointments you may be dismissed from the clinic at the providers discretion.     Again, thank you for choosing [facility].  Our hope is that these requests will decrease the amount of time that you wait before being seen by our physicians.       _____________________________________________________________  Should you have questions after your visit to  Cancer Center, please contact our office at [phone] between the hours of 8:00 a.m. and 4:30 p.m.  Voicemails left after 4:00 p.m. will not be returned until the following business day.  For prescription refill requests, have your pharmacy contact our office and allow 72 hours.    Cancer Center Support Programs:   > Cancer Support Group  2nd Tuesday of the month 1pm-2pm, Journey Room    

## 2019-03-03 NOTE — Progress Notes (Signed)
Pt presents today for tx and f/u visit with Dr. Delton Coombes. MAR reviewed. Pt has no complaints of any changes since the last visit. Labs pending. Pt is requesting Xanax refill today. Pt states. " I went to get it yesterday from the pharmacy and they said it wasn't time."   Labs reviewed by Dr. Delton Coombes. VO received to proceed with tx today.   Treatment given today per MD orders. Tolerated infusion without adverse affects. Vital signs stable. No complaints at this time. Discharged from clinic via wheel chair. F/U with [facility] as scheduled.

## 2019-03-03 NOTE — Progress Notes (Signed)
Darrell Christensen, [place] [address]   CLINIC:  Medical Oncology/Hematology  PCP:  Celene Squibb, MD [place] [address] [phone]   REASON FOR VISIT:  Follow-up for stage IV adenocarcinoma of the right lung   BRIEF ONCOLOGIC HISTORY:  Oncology History  Primary cancer of right upper lobe of lung ([place])  05/14/2015 Procedure   Port placement by Dr. Arnoldo Morale   04/01/2016 Initial Diagnosis   Primary cancer of right upper lobe of lung ([place])   04/02/2016 Procedure   Ultrasound-guided core biopsy performed of a solid 1.5 cm soft tissue nodule in the right chest wall.   04/03/2016 Imaging   MRI brain- No acute and intracranial process or metastasis.  Moderate chronic small vessel ischemic disease.   04/04/2016 Pathology Results   Diagnosis Soft Tissue Needle Core Biopsy, Right Chest Wall SMOOTH MUSCLE NEOPLASM Microscopic Comment The differential diagnosis include low grade leiomyosarcoma. The neoplasm shows spindle cell morphology with rare mitotic figures and minimal atypia, no necrosis present. These cells stains positive for smooth muscle actin, desmin, negative for cytokeratin AE1&3, ck8/18, s100, cd117, cd 34 and cd99. This case also reviewed by Dr. [name] and agree.   04/05/2016 Procedure   CT-guided biopsy of right upper lobe lesion, with tissue specimen sent to pathology, by IR.   04/09/2016 Pathology Results   Lung, needle/core biopsy(ies), Right Upper Lobe - NON SMALL CELL CARCINOMA.   04/17/2016 Pathology Results   PDL1 High Expression.  TPS 50%.   04/24/2016 PET scan   1. Prominently hypermetabolic large right upper lobe mass with metastatic disease to the right paratracheal and right hilar nodal chains, to the descending mesocolon, to the left upper buttock subcutaneous tissues, to the left upper lobe, to the right subscapularis muscle, and possibly to the left adrenal gland and right breast  subcutaneous tissues. Appearance compatible with stage IV lung cancer. 2. There is some focal high activity in the ascending colon which is probably from peristalsis, less likely from a local colon mass. This does raise the possibility that the adjacent mesocolon tumor implant could be due to synchronous colon cancer rather than lung metastasis. 3. Other imaging findings of potential clinical significance: Coronary, aortic arch, and branch vessel atherosclerotic vascular disease. Aortoiliac atherosclerotic vascular disease. Deformity left proximal humerus and left glenohumeral joint from prior trauma. Enlarged prostate gland.   04/26/2016 Pathology Results   FoundationONE: Genomic alterations identified- KRAS X83A, CREBBP splice site [id number], LRP1B S515*, AYO45 T97*, FSF42 splice site [id number], SPTA1 splice site [id number], TP53 C135Y.  Additional findings- MS-Stable, TMB-intermediate (14 Muts/Mb).  No reportable alterations- EGFR, ALK, BRAF, MET, ERBB2, RET, ROS1.   05/17/2016 -  Chemotherapy   The patient had ONDANSETRON IVPB [facility] +/- DEXAMETHASONE, , Intravenous,  Once, 0 of 1 cycle  pembrolizumab (KEYTRUDA) 200 mg in sodium chloride 0.9 % 50 mL chemo infusion, 200 mg, Intravenous, Once, 0 of 5 cycles  for chemotherapy treatment.     08/06/2016 PET scan   IMPRESSION: 1. Overall considerable improvement. The right upper lobe mass is markedly reduced in volume and SUV. Thoracic adenopathy have resolved and the hypermetabolic lesion in the right subscapularis muscle has also resolved. 2. The left upper lobe nodule is no longer appreciably hypermetabolic, and is highly indistinct although this may be due to motion artifact. Faint in indistinct nodularity elsewhere in the lungs likewise not currently hypermetabolic. 3. There is some very faint residual low-grade activity along the subcutaneous deposit  along the upper left buttock region. Marked reduction in size and activity of  the tumor deposit adjacent to the descending colon. 4. Prior right pleural effusion has resolved. 5. Stable appearance of the adrenal glands, with low-level activity and a nodule in the left adrenal gland. 6. Stable small nodule in the right breast, low-grade metabolic activity with maximum SUV 2.2 (formerly 3.0) 7. Other imaging findings of potential clinical significance: Severe arthropathy of the left glenohumeral joint. Coronary, aortic arch, and branch vessel atherosclerotic vascular disease. Aortoiliac atherosclerotic vascular disease. Prominent prostate gland indents the bladder base.    12/16/2018 -  Chemotherapy   The patient had palonosetron (ALOXI) injection 0.25 mg, 0.25 mg, Intravenous,  Once, 4 of 4 cycles Administration: 0.25 mg (12/16/2018), 0.25 mg (01/27/2019), 0.25 mg (03/03/2019), 0.25 mg (01/06/2019) pegfilgrastim (NEULASTA) injection 6 mg, 6 mg, Subcutaneous, Once, 2 of 2 cycles Administration: 6 mg (01/08/2019) pegfilgrastim (NEULASTA ONPRO KIT) injection 6 mg, 6 mg, Subcutaneous, Once, 2 of 2 cycles pegfilgrastim-cbqv (UDENYCA) injection 6 mg, 6 mg, Subcutaneous, Once, 2 of 4 cycles Administration: 6 mg (01/29/2019) PEMEtrexed (ALIMTA) 1,100 mg in sodium chloride 0.9 % 100 mL chemo infusion, 490 mg/m2 = 1,125 mg, Intravenous,  Once, 4 of 6 cycles Dose modification: 400 mg/m2 (80 % of original dose 500 mg/m2, Cycle 3, Reason: Other (see comments), Comment: rash, swelling) Administration: 1,100 mg (12/16/2018), 900 mg (01/27/2019), 900 mg (03/03/2019), 1,100 mg (01/06/2019) CARBOplatin (PARAPLATIN) 500 mg in sodium chloride 0.9 % 250 mL chemo infusion, 530 mg (110.7 % of original dose 477.5 mg), Intravenous,  Once, 4 of 4 cycles Dose modification:   (original dose 477.5 mg, Cycle 1),   (original dose 485.5 mg, Cycle 3),   (original dose 494 mg, Cycle 4),   (original dose 494 mg, Cycle 2) Administration: 500 mg (12/16/2018), 490 mg (01/27/2019), 490 mg (03/03/2019), 490 mg  (01/06/2019) fosaprepitant (EMEND) 150 mg, dexamethasone (DECADRON) 12 mg in sodium chloride 0.9 % 145 mL IVPB, , Intravenous,  Once, 4 of 4 cycles Administration:  (12/16/2018),  (01/27/2019),  (03/03/2019),  (01/06/2019)  for chemotherapy treatment.    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  Two adjacent large masslike areas of consolidation within the right upper lobe with differential considerations including malignancy or pneumonia. Multiple enlarged right hilar and mediastinal lymph nodes which may be reactive or metastatic in etiology.  Additional indeterminate pulmonary nodules as above. Recommend attention on follow-up.  Indeterminate 2.9 cm left adrenal nodule. This needs dedicated evaluation with pre and post contrast-enhanced CT or MRI after confirmation of pulmonary process.  Indeterminate nodule within the right breast. Recommend dedicated evaluation with mammography.  Hepatic steatosis.   04/02/2016 Procedure   Ultrasound-guided core biopsy performed of a solid 1.5 cm soft tissue nodule in the right chest wall.   04/04/2016 Pathology Results   Diagnosis Soft Tissue Needle Core Biopsy, Right Chest Wall SMOOTH MUSCLE NEOPLASM Microscopic Comment The differential diagnosis include low grade leiomyosarcoma. The neoplasm shows spindle cell morphology with rare mitotic figures and minimal atypia, no necrosis present. These cells stains positive for smooth muscle actin, desmin, negative for cytokeratin AE1&3, ck8/18, s100, cd117, cd 34 and cd99. This case also reviewed by Dr. [name] and agree.       CANCER STAGING: Cancer Staging Primary cancer of right upper lobe of lung ([place]) Staging form: Lung, AJCC 8th Edition - Clinical: Stage IVB (  cT3, cN2, cM1c) - Signed by Baird Cancer, PA-C on 05/07/2016    INTERVAL HISTORY:   Mr. Polinski 73 y.o. male seen for follow-up and toxicity assessment prior to cycle 4 of chemotherapy.  His last cycle was held due to elevated creatinine.  He is taking a fluid pill 1 to 2 tablets daily.  Appetite is 25%.  Energy levels are low.  He complains of shortness of breath with activity.  Occasional constipation is present.  He is accompanied by his daughter today.  He has occasional lightheadedness when he stands up.  No rash was reported.  REVIEW OF SYSTEMS:  Review of Systems  Respiratory: Positive for shortness of breath.   Cardiovascular: Positive for leg swelling.  Gastrointestinal: Positive for constipation.  Neurological: Positive for numbness.  All other systems reviewed and are negative.    PAST MEDICAL/SURGICAL HISTORY:  Past Medical History:  Diagnosis Date  . Adrenal mass, left ([place]) 04/01/2016  . Diabetes mellitus ([place]) 05/02/2016  . Diabetes mellitus without complication ([place])   . Hypertension   . Mass of breast, right 04/01/2016  . Mass of upper lobe of right lung 04/01/2016   2 adjacent, 5 cm masses, hilar adenopathy  . Primary cancer of right upper lobe of lung ([place]) 04/01/2016   2 adjacent, 5 cm masses, hilar adenopathy  . Pulmonary embolus ([place]) 03/31/2016  . 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 COLONOSCOPY N/A 07/28/2017   Procedure: COLONOSCOPY;  Surgeon: Rogene Houston, MD;  Location: [facility] ENDO SUITE;  Service: Endoscopy;  Laterality: N/A;  205  . PORTACATH PLACEMENT Left 05/13/2016   Procedure: INSERTION PORT-A-CATH;  Surgeon: Aviva Signs, MD;  Location: [facility] ORS;  Service: General;  Laterality: Left;     SOCIAL HISTORY:  Social History   Socioeconomic History  . Marital status: Divorced    Spouse name: Not on file  . Number of children: Not on file  . Years of education: Not on file  . Highest education level: Not on file  Occupational History  . Not on file  Social Needs  . Financial resource  strain: Not on file  . Food insecurity    Worry: Not on file    Inability: Not on file  . Transportation needs    Medical: No    Non-medical: No  Tobacco Use  . Smoking status: Former Smoker    Years: 61.00    Quit date: 04/11/2014    Years since quitting: 4.8  . Smokeless tobacco: Never Used  . Tobacco comment: patient does vape smoking x 2 years  Substance and Sexual Activity  . Alcohol use: Not Currently    Comment: quit 10 years  . Drug use: No  . Sexual activity: Not on file  Lifestyle  . Physical activity    Days per week: Not on file    Minutes per session: Not on file  . Stress: Not on file  Relationships  . Social Herbalist on phone: Not on file    Gets together: Not on file    Attends religious service: Not on file    Active member of club or organization: Not on file    Attends meetings of clubs or organizations: Not on file    Relationship status: Not on file  . Intimate partner violence    Fear of current or ex partner: Not on file    Emotionally abused: Not on file  Physically abused: Not on file    Forced sexual activity: Not on file  Other Topics Concern  . Not on file  Social History Narrative  . Not on file    FAMILY HISTORY:  Family History  Problem Relation Age of Onset  . Stroke Mother   . Heart attack Father   . Heart attack Brother     CURRENT MEDICATIONS:  Outpatient Encounter Medications as of 03/03/2019  Medication Sig  . amLODipine (NORVASC) 2.5 MG tablet Take 2.5 mg by mouth daily.  Marland Kitchen CARBOPLATIN IV Inject into the vein every 21 ( twenty-one) days.  . folic acid (FOLVITE) 1 MG tablet Take 1 tablet (1 mg total) by mouth daily. Start 5-7 days before Alimta chemotherapy. Continue until 21 days after Alimta completed.  . furosemide (LASIX) 20 MG tablet Take 2 tablets (40 mg total) by mouth daily.  Marland Kitchen GLIPIZIDE XL 5 MG 24 hr tablet Take 5 mg by mouth daily.  . hydrocortisone 2.5 % lotion Apply topically as needed.   .  lidocaine-prilocaine (EMLA) cream APPLY A QUARTER SIZE AMOUNT TO THE AFFECTED AREA 1 HOUR PRIOR TO COMING TO CHEMOTHERAPY. COVER WITH A PLASTIC WRAP.  . magnesium oxide (MAG-OX) 400 (241.3 Mg) MG tablet Take 1 tablet (400 mg total) by mouth 2 (two) times daily.  . metFORMIN (GLUCOPHAGE) 500 MG tablet Take 500 mg by mouth 2 (two) times daily.  . metoprolol succinate (TOPROL-XL) 25 MG 24 hr tablet TAKE ONE TABLET BY MOUTH IN THE EVENING.  . Pembrolizumab (KEYTRUDA IV) Inject into the vein.  Marland Kitchen PEMEtrexed 500 mg/m2 in sodium chloride 0.9 % 100 mL Inject into the vein every 21 ( twenty-one) days.  . potassium chloride (K-DUR,KLOR-CON) 10 MEQ tablet Take 10 mEq by mouth daily.   . simvastatin (ZOCOR) 20 MG tablet Take 20 mg by mouth every evening.  . torsemide (DEMADEX) 20 MG tablet Take 1 tablet (20 mg total) by mouth daily.  Marland Kitchen ALPRAZolam (XANAX) 1 MG tablet Take 1 mg by mouth 3 (three) times daily as needed for anxiety.  . ondansetron (ZOFRAN) 4 MG tablet Take 1 tablet (4 mg total) by mouth every 8 (eight) hours as needed for nausea or vomiting. (Patient not taking: Reported on 02/17/2019)  . oxyCODONE-acetaminophen (PERCOCET) 10-325 MG tablet Take 1 tablet by mouth as needed.   . prochlorperazine (COMPAZINE) 10 MG tablet Take 1 tablet (10 mg total) by mouth every 6 (six) hours as needed (Nausea or vomiting). (Patient not taking: Reported on 02/04/2019)   No facility-administered encounter medications on file as of 03/03/2019.     ALLERGIES:  No Known Allergies   PHYSICAL EXAM:  ECOG Performance status: 1  Vitals:   03/03/19 0851  BP: 140/60  Pulse: (!) 56  Resp: 20  Temp: (!) 97.3 F (36.3 C)  SpO2: 96%   Filed Weights   03/03/19 0851  Weight: 205 lb (93 kg)    Physical Exam Vitals signs reviewed.  Constitutional:      Appearance: Normal appearance.  Cardiovascular:     Rate and Rhythm: Normal rate and regular rhythm.     Heart sounds: Normal heart sounds.  Pulmonary:      Effort: Pulmonary effort is normal.     Breath sounds: Normal breath sounds.  Abdominal:     General: There is no distension.     Palpations: Abdomen is soft. There is no mass.  Musculoskeletal:        General: No swelling.  Skin:  General: Skin is warm.  Neurological:     General: No focal deficit present.     Mental Status: He is alert and oriented to person, place, and time.  Psychiatric:        Mood and Affect: Mood normal.        Behavior: Behavior normal.    No edema in the lower extremities.  LABORATORY DATA:  I have reviewed the labs as listed.  CBC    Component Value Date/Time   WBC 11.5 (H) 03/03/2019 0828   RBC 3.29 (L) 03/03/2019 0828   HGB 10.6 (L) 03/03/2019 0828   HGB 13.5 05/02/2016 0852   HCT 34.1 (L) 03/03/2019 0828   HCT 41.0 05/02/2016 0852   PLT 261 03/03/2019 0828   PLT 436 (H) 05/02/2016 0852   MCV 103.6 (H) 03/03/2019 0828   MCV 85.7 05/02/2016 0852   MCH 32.2 03/03/2019 0828   MCHC 31.1 03/03/2019 0828   RDW 20.7 (H) 03/03/2019 0828   RDW 13.9 05/02/2016 0852   LYMPHSABS 3.8 03/03/2019 0828   LYMPHSABS 3.4 (H) 05/02/2016 0852   MONOABS 1.0 03/03/2019 0828   MONOABS 1.2 (H) 05/02/2016 0852   EOSABS 0.3 03/03/2019 0828   EOSABS 1.6 (H) 05/02/2016 0852   BASOSABS 0.1 03/03/2019 0828   BASOSABS 0.2 (H) 05/02/2016 0852   CMP Latest Ref Rng & Units 03/03/2019 02/17/2019 02/04/2019  Glucose 70 - 99 mg/dL 120(H) 96 131(H)  BUN 8 - 23 mg/dL 16 20 37(H)  Creatinine 0.61 - 1.24 mg/dL 1.28(H) 1.65(H) 1.37(H)  Sodium 135 - 145 mmol/L 142 142 142  Potassium 3.5 - 5.1 mmol/L 3.6 4.6 3.9  Chloride 98 - 111 mmol/L 105 109 104  CO2 22 - 32 mmol/L '24 23 24  '$ Calcium 8.9 - 10.3 mg/dL 9.4 9.0 8.9  Total Protein 6.5 - 8.1 g/dL 7.3 7.3 7.1  Total Bilirubin 0.3 - 1.2 mg/dL 0.5 0.4 0.9  Alkaline Phos 38 - 126 U/L 78 91 112  AST 15 - 41 U/L 26 30 39  ALT 0 - 44 U/L 29 46(H) 40       DIAGNOSTIC IMAGING:  I have independently reviewed the scans and  discussed with the patient.     ASSESSMENT & PLAN:   Primary cancer of right upper lobe of lung ([place]) 1.  Stage IV adenocarcinoma of the lung, PDL 1-50%, other actionable mutations negative: - Keytruda from 05/17/2016 through 09/24/2018 with mild progression. -CT of the chest on 12/17/2018 shows posterior right upper lobe mass measured 4.1 x 3.2 cm, previously 5.2 x 3.9 cm.  Right paratracheal lymph node decreased from 12 mm to 9 mm.  No new areas were seen.  Right breast nodule also decreased in size. -3 cycles of carboplatin and pemetrexed from 12/25/2018 through 01/27/2019. -I have reviewed his labs.  Creatinine improved to 1.2. -He may proceed with cycle 4 chemotherapy today.  I plan to arrange for CT scans prior to next visit. -We will consider maintenance chemotherapy with pemetrexed and pembrolizumab or single agent pembrolizumab at that point.  2.  Brain metastasis: -Right parietal lobe 16.6 x 13.5 x 15.5 mm lesion on MRI dated 11/12/2018 with significant surrounding edema and mass-effect. -SRS to the brain lesion on 11/24/2018. - He has been tapered off of dexamethasone on 01/09/2019.  3.  Lower extremity swelling: -He is taking Lasix 20 mg 2 tablets daily.  4.  Hypomagnesemia: -He will continue magnesium twice daily.  Total time spent is 25 minutes with more than  50% of the time spent face-to-face discussing treatment plan, counseling and coordination of care.    Orders placed this encounter:  Orders Placed This Encounter  Procedures  . CT Chest W Contrast      Derek Jack, MD [place] [phone]

## 2019-03-05 ENCOUNTER — Inpatient Hospital Stay (HOSPITAL_COMMUNITY): Payer: Medicare Other

## 2019-03-05 ENCOUNTER — Other Ambulatory Visit: Payer: Self-pay

## 2019-03-05 VITALS — BP 133/81 | HR 88 | Temp 97.5°F | Resp 16

## 2019-03-05 DIAGNOSIS — M7989 Other specified soft tissue disorders: Secondary | ICD-10-CM | POA: Diagnosis not present

## 2019-03-05 DIAGNOSIS — C3411 Malignant neoplasm of upper lobe, right bronchus or lung: Secondary | ICD-10-CM | POA: Diagnosis not present

## 2019-03-05 DIAGNOSIS — Z7984 Long term (current) use of oral hypoglycemic drugs: Secondary | ICD-10-CM | POA: Diagnosis not present

## 2019-03-05 DIAGNOSIS — Z86711 Personal history of pulmonary embolism: Secondary | ICD-10-CM | POA: Diagnosis not present

## 2019-03-05 DIAGNOSIS — E119 Type 2 diabetes mellitus without complications: Secondary | ICD-10-CM | POA: Diagnosis not present

## 2019-03-05 DIAGNOSIS — C7931 Secondary malignant neoplasm of brain: Secondary | ICD-10-CM | POA: Diagnosis not present

## 2019-03-05 DIAGNOSIS — I10 Essential (primary) hypertension: Secondary | ICD-10-CM | POA: Diagnosis not present

## 2019-03-05 DIAGNOSIS — Z87891 Personal history of nicotine dependence: Secondary | ICD-10-CM | POA: Diagnosis not present

## 2019-03-05 DIAGNOSIS — Z5189 Encounter for other specified aftercare: Secondary | ICD-10-CM | POA: Diagnosis not present

## 2019-03-05 DIAGNOSIS — Z79899 Other long term (current) drug therapy: Secondary | ICD-10-CM | POA: Diagnosis not present

## 2019-03-05 DIAGNOSIS — Z5111 Encounter for antineoplastic chemotherapy: Secondary | ICD-10-CM | POA: Diagnosis not present

## 2019-03-05 MED ORDER — PEGFILGRASTIM-CBQV 6 MG/0.6ML ~~LOC~~ SOSY
6.0000 mg | PREFILLED_SYRINGE | Freq: Once | SUBCUTANEOUS | Status: AC
  Administered 2019-03-05: 13:00:00 6 mg via SUBCUTANEOUS

## 2019-03-05 MED ORDER — PEGFILGRASTIM-CBQV 6 MG/0.6ML ~~LOC~~ SOSY
PREFILLED_SYRINGE | SUBCUTANEOUS | Status: AC
  Filled 2019-03-05: qty 0.6

## 2019-03-05 NOTE — Progress Notes (Signed)
Nutrition Follow-up:  Patient with stage IV adenocarcinoma of lung. Patient receiving pemetrexed and carboplatin.    Met with patient today in clinic.  Patient reports that his appetite is about the same.  Has not eaten anything so far today (up at daylight), did drink coffee.  Report that ensures made him have diarrhea.  Has drank them in the past without difficulty.  Reports that he is not a cook and often comes to town for meals.   Reports constipation today.   Medications: reviewed  Labs: reviewed from 10/28  Anthropometrics:   Weight 205 lb on 10/28 increased from weight of 200lb on 10/14 but overall decreased.    NUTRITION DIAGNOSIS: Inadequate oral intake continues   INTERVENTION:  Discussed ways to try ensure to decrease risk of diarrhea.  Recommended trying to eat solid food within 2 hours of waking up.   Patient has contact information    MONITORING, EVALUATION, GOAL: Patient will consume adequate calories and protein to maintain weight and lean muscle mass   NEXT VISIT: Dec 11th  Darrell Christensen, [place], [place] Registered Dietitian [phone] (pager)

## 2019-03-05 NOTE — Progress Notes (Signed)
Udenyca injection given per orders.Patient tolerated it well without problems. Vitals stable and discharged home from clinic ambulatory. Follow up as scheduled.

## 2019-03-10 ENCOUNTER — Ambulatory Visit (HOSPITAL_COMMUNITY): Payer: Medicare Other | Admitting: Nurse Practitioner

## 2019-03-10 ENCOUNTER — Other Ambulatory Visit (HOSPITAL_COMMUNITY): Payer: Medicare Other

## 2019-03-10 ENCOUNTER — Ambulatory Visit (HOSPITAL_COMMUNITY): Payer: Medicare Other

## 2019-03-11 ENCOUNTER — Other Ambulatory Visit: Payer: Self-pay

## 2019-03-11 ENCOUNTER — Ambulatory Visit (HOSPITAL_COMMUNITY)
Admission: RE | Admit: 2019-03-11 | Discharge: 2019-03-11 | Disposition: A | Discharge status: Home / Self Care | Payer: Medicare Other | Source: Ambulatory Visit | Attending: Radiation Oncology | Admitting: Radiation Oncology

## 2019-03-11 DIAGNOSIS — C7949 Secondary malignant neoplasm of other parts of nervous system: Secondary | ICD-10-CM | POA: Diagnosis not present

## 2019-03-11 DIAGNOSIS — C3490 Malignant neoplasm of unspecified part of unspecified bronchus or lung: Secondary | ICD-10-CM | POA: Diagnosis not present

## 2019-03-11 DIAGNOSIS — C7931 Secondary malignant neoplasm of brain: Secondary | ICD-10-CM | POA: Insufficient documentation

## 2019-03-11 MED ORDER — GADOBUTROL 1 MMOL/ML IV SOLN
10.0000 mL | Freq: Once | INTRAVENOUS | Status: AC | PRN
  Administered 2019-03-11: 17:00:00 10 mL via INTRAVENOUS

## 2019-03-12 ENCOUNTER — Ambulatory Visit (HOSPITAL_COMMUNITY): Payer: Medicare Other

## 2019-03-15 ENCOUNTER — Ambulatory Visit
Admission: RE | Admit: 2019-03-15 | Discharge: 2019-03-15 | Disposition: A | Discharge status: Home / Self Care | Payer: Medicare Other | Source: Ambulatory Visit | Attending: Radiation Oncology | Admitting: Radiation Oncology

## 2019-03-15 ENCOUNTER — Other Ambulatory Visit: Payer: Self-pay

## 2019-03-15 DIAGNOSIS — Z08 Encounter for follow-up examination after completed treatment for malignant neoplasm: Secondary | ICD-10-CM | POA: Diagnosis not present

## 2019-03-15 DIAGNOSIS — C3411 Malignant neoplasm of upper lobe, right bronchus or lung: Secondary | ICD-10-CM | POA: Diagnosis not present

## 2019-03-15 DIAGNOSIS — Z85118 Personal history of other malignant neoplasm of bronchus and lung: Secondary | ICD-10-CM | POA: Diagnosis not present

## 2019-03-15 DIAGNOSIS — C7931 Secondary malignant neoplasm of brain: Secondary | ICD-10-CM

## 2019-03-15 NOTE — Progress Notes (Signed)
Radiation Oncology         [phone] ________________________________  Outpatient Follow Up - Conducted via telephone due to current COVID-19 concerns for limiting patient exposure  I spoke with the patient to conduct this consult visit via telephone to spare the patient unnecessary potential exposure in the healthcare setting during the current COVID-19 pandemic. The patient was notified in advance and was offered a [place] meeting to allow for face to face communication but unfortunately reported that they did not have the appropriate resources/technology to support such a visit and instead preferred to proceed with a telephone visit. ________________________________  Name: Darrell Christensen MRN: [id number]  Date of Service: 03/15/2019  DOB: [birth year]   Diagnosis:   Progressive Metastatic Stage IV NSCLC, adenocarcinoma of the RUL with new brain metastasis.  Interval Since Last Radiation:  4 months  11/18/2018-11/24/2018 SRS Treatment:  PTV1 Left Parietal 17 mm received 27 Gy in 3 fractions  Narrative:  Darrell Christensen is a pleasant 73 y.o. gentleman with a history of Stage IV, NSCLC, adenocarcinoma of the RUL.  He presented in 2017 with stage IV disease and received Keytruda between January 2018 and Sep 24, 2018 when he was found to have progressive disease in the right upper lobe.  He was seen by Dr. Julien Nordmann for a second opinion and was encouraged to consider chemoimmunotherapy for 4 cycles with carboplatin and pemetrexed along with his Keytruda.  He was in the midst of getting this authorized, and presented with slurred speech after a fall.  He has been having approximately 3 weeks of headaches as well, and was sent for an MRI of the brain yesterday.  He received 1 mg of IV Ativan through his Port-A-Cath.  His scan revealed a 22 mm brain metastasis in the posterior aspect of the right temporal lobe with significant vasogenic edema in the right hemisphere.  Only mild regional mass-effect was noted  without any midline shift.  Proceeded with stereotactic radiosurgery in 3 fractions which she completed in July 2020.  He has since been on systemic therapy with Dr. Delton Coombes on carboplatin and pemetrexed.  He is due for a repeat scan next week and to see Dr. Delton Coombes for next steps.  He is contacted by phone today to discuss recommendations regarding his MRI that was performed on 03/11/2019 revealing improvement of his treated site now measuring 4.5 mm without new disease.  On review of systems, the patient reports that he is doing well overall. He denies any chest pain, shortness of breath, cough, fevers, chills, night sweats, unintended weight changes. He denies any headaches or changes in gait, visual or auditory dysfunctions. He denies any bowel or bladder disturbances, and denies abdominal pain, nausea or vomiting. He denies any new musculoskeletal or joint aches or pains, new skin lesions or concerns. A complete review of systems is obtained and is otherwise negative.  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[facility])     Past Surgical History: Past Surgical History:  Procedure Laterality  Date   COLONOSCOPY N/A 07/28/2017   Procedure: COLONOSCOPY;  Surgeon: Rogene Houston, MD;  Location: [facility] ENDO SUITE;  Service: Endoscopy;  Laterality: N/A;  [place] Left 05/13/2016   Procedure: INSERTION PORT-A-CATH;  Surgeon: Aviva Signs, MD;  Location: [facility] ORS;  Service: General;  Laterality: Left;    Social History:  Social  History   Socioeconomic History   Marital status: Divorced    Spouse name: Not on file   Number of children: Not on file   Years of education: Not on file   Highest education level: Not on file  Occupational History   Not on file  Social Needs   Financial resource strain: Not on file   Food insecurity    Worry: Not on file    Inability: Not on file   Transportation needs    Medical: No    Non-medical: No  Tobacco Use   Smoking status: Former Smoker    Years: 61.00    Quit date: 04/11/2014    Years since quitting: 4.9   Smokeless tobacco: Never Used   Tobacco comment: patient does vape smoking x 2 years  Substance and Sexual Activity   Alcohol use: Not Currently    Comment: quit 10 years   Drug use: No   Sexual activity: Not on file  Lifestyle   Physical activity    Days per week: Not on file    Minutes per session: Not on file   Stress: Not on file  Relationships   Social connections    Talks on phone: Not on file    Gets together: Not on file    Attends religious service: Not on file    Active member of club or organization: Not on file    Attends meetings of clubs or organizations: Not on file    Relationship status: Not on file   Intimate partner violence    Fear of current or ex partner: Not on file    Emotionally abused: Not on file    Physically abused: Not on file    Forced sexual activity: Not on file  Other Topics Concern   Not on file  Social History Narrative   Not on file  The patient is divorced and lives in [place].   Family History: Family History  Problem Relation Age of Onset   Stroke Mother    Heart attack Father    Heart attack Brother    Medications: Current Outpatient Medications  Medication Sig Dispense Refill   ALPRAZolam (XANAX) 1 MG tablet Take 1 mg by mouth 3 (three) times daily as needed for anxiety.     amLODipine (NORVASC) 2.5 MG tablet Take 2.5 mg by mouth daily.  4   CARBOPLATIN IV Inject into  the vein every 21 ( twenty-one) days.     folic acid (FOLVITE) 1 MG tablet Take 1 tablet (1 mg total) by mouth daily. Start 5-7 days before Alimta chemotherapy. Continue until 21 days after Alimta completed. 100 tablet 3   furosemide (LASIX) 20 MG tablet Take 2 tablets (40 mg total) by mouth daily. 20 tablet 0   GLIPIZIDE XL 5 MG 24 hr tablet Take 5 mg by mouth daily.  4   hydrocortisone 2.5 % lotion Apply topically as needed.      lidocaine-prilocaine (EMLA) cream APPLY A QUARTER SIZE AMOUNT TO THE AFFECTED AREA 1 HOUR PRIOR TO COMING TO CHEMOTHERAPY. COVER WITH A PLASTIC WRAP. 30 g 0   magnesium  oxide (MAG-OX) 400 (241.3 Mg) MG tablet Take 1 tablet (400 mg total) by mouth 2 (two) times daily. 60 tablet 2   metFORMIN (GLUCOPHAGE) 500 MG tablet Take 500 mg by mouth 2 (two) times daily.  4   metoprolol succinate (TOPROL-XL) 25 MG 24 hr tablet TAKE ONE TABLET BY MOUTH IN THE EVENING. 30 tablet 0   ondansetron (ZOFRAN) 4 MG tablet Take 1 tablet (4 mg total) by mouth every 8 (eight) hours as needed for nausea or vomiting. (Patient not taking: Reported on 02/17/2019) 4 tablet 0   oxyCODONE-acetaminophen (PERCOCET) 10-325 MG tablet Take 1 tablet by mouth as needed.   0   Pembrolizumab (KEYTRUDA IV) Inject into the vein.     PEMEtrexed 500 mg/m2 in sodium chloride 0.9 % 100 mL Inject into the vein every 21 ( twenty-one) days.     potassium chloride (K-DUR,KLOR-CON) 10 MEQ tablet Take 10 mEq by mouth daily.   0   prochlorperazine (COMPAZINE) 10 MG tablet Take 1 tablet (10 mg total) by mouth every 6 (six) hours as needed (Nausea or vomiting). (Patient not taking: Reported on 02/04/2019) 30 tablet 1   simvastatin (ZOCOR) 20 MG tablet Take 20 mg by mouth every evening.  5   torsemide (DEMADEX) 20 MG tablet Take 1 tablet (20 mg total) by mouth daily. 30 tablet 1   No current facility-administered medications for this encounter.    Allergies: No Known Allergies  Physical Exam: Unable to  assess due to encounter type.  Impression/Plan: 1. Progressive Metastatic Stage IV NSCLC, adenocarcinoma of the RUL with new brain metastasis. The patient has been doing well since completion of radiotherapy and with chemotherapy. We reviewed his scan results and the plan to repeat a scan in 3 months time with an MRI of the brain. He will continue as well with Dr. Delton Coombes to receive systemic therapy and follow up with CT imaging next week as well.  2. Claustraphobia. He will continue to premedicate with Ativan prior to MRI scans in the future.     Given current concerns for patient exposure during the COVID-19 pandemic, this encounter was conducted via telephone.  The patient has given verbal consent for this type of encounter. The time spent during this encounter was 20 minutes and 50% of that time was spent in the coordination of his care. The attendants for this meeting include Shona Simpson, [facility] and Merita Norton  During the encounter, Shona Simpson [facility] was working remotely at home. Aneesh G Schmutz  was located at home.    Carola Rhine, PAC

## 2019-03-22 ENCOUNTER — Ambulatory Visit (HOSPITAL_COMMUNITY)
Admission: RE | Admit: 2019-03-22 | Discharge: 2019-03-22 | Disposition: A | Discharge status: Home / Self Care | Payer: Medicare Other | Source: Ambulatory Visit | Attending: Hematology | Admitting: Hematology

## 2019-03-22 ENCOUNTER — Other Ambulatory Visit: Payer: Self-pay

## 2019-03-22 DIAGNOSIS — C3411 Malignant neoplasm of upper lobe, right bronchus or lung: Secondary | ICD-10-CM | POA: Diagnosis not present

## 2019-03-22 DIAGNOSIS — C3490 Malignant neoplasm of unspecified part of unspecified bronchus or lung: Secondary | ICD-10-CM | POA: Diagnosis not present

## 2019-03-22 MED ORDER — IOHEXOL 300 MG/ML  SOLN
75.0000 mL | Freq: Once | INTRAMUSCULAR | Status: AC | PRN
  Administered 2019-03-22: 75 mL via INTRAVENOUS

## 2019-03-23 ENCOUNTER — Encounter (HOSPITAL_COMMUNITY): Payer: Self-pay | Admitting: General Practice

## 2019-03-23 NOTE — Progress Notes (Signed)
[place] CSW Progress Notes  Email from [facility].  They have paid a portion of past due utility bill and patient is on their monthly food distribution list.  Edwyna Shell, [place] Worker Phone:  [phone]

## 2019-03-24 ENCOUNTER — Inpatient Hospital Stay (HOSPITAL_COMMUNITY): Payer: Medicare Other | Attending: Hematology

## 2019-03-24 ENCOUNTER — Other Ambulatory Visit: Payer: Self-pay

## 2019-03-24 ENCOUNTER — Inpatient Hospital Stay (HOSPITAL_COMMUNITY): Payer: Medicare Other | Admitting: Hematology

## 2019-03-24 ENCOUNTER — Inpatient Hospital Stay (HOSPITAL_COMMUNITY): Payer: Medicare Other

## 2019-03-24 ENCOUNTER — Encounter (HOSPITAL_COMMUNITY): Payer: Self-pay | Admitting: Hematology

## 2019-03-24 VITALS — BP 142/69 | HR 80 | Temp 97.3°F | Resp 18

## 2019-03-24 DIAGNOSIS — C3411 Malignant neoplasm of upper lobe, right bronchus or lung: Secondary | ICD-10-CM | POA: Diagnosis not present

## 2019-03-24 DIAGNOSIS — E119 Type 2 diabetes mellitus without complications: Secondary | ICD-10-CM | POA: Insufficient documentation

## 2019-03-24 DIAGNOSIS — Z79899 Other long term (current) drug therapy: Secondary | ICD-10-CM | POA: Diagnosis not present

## 2019-03-24 DIAGNOSIS — C7931 Secondary malignant neoplasm of brain: Secondary | ICD-10-CM | POA: Insufficient documentation

## 2019-03-24 DIAGNOSIS — Z87891 Personal history of nicotine dependence: Secondary | ICD-10-CM | POA: Insufficient documentation

## 2019-03-24 DIAGNOSIS — M7989 Other specified soft tissue disorders: Secondary | ICD-10-CM | POA: Diagnosis not present

## 2019-03-24 DIAGNOSIS — Z5111 Encounter for antineoplastic chemotherapy: Secondary | ICD-10-CM | POA: Insufficient documentation

## 2019-03-24 DIAGNOSIS — I10 Essential (primary) hypertension: Secondary | ICD-10-CM | POA: Diagnosis not present

## 2019-03-24 DIAGNOSIS — Z86711 Personal history of pulmonary embolism: Secondary | ICD-10-CM | POA: Diagnosis not present

## 2019-03-24 DIAGNOSIS — Z7984 Long term (current) use of oral hypoglycemic drugs: Secondary | ICD-10-CM | POA: Diagnosis not present

## 2019-03-24 LAB — COMPREHENSIVE METABOLIC PANEL
ALT: 19 U/L (ref 0–44)
AST: 23 U/L (ref 15–41)
Albumin: 4.1 g/dL (ref 3.5–5.0)
Alkaline Phosphatase: 80 U/L (ref 38–126)
Anion gap: 14 (ref 5–15)
BUN: 13 mg/dL (ref 8–23)
CO2: 23 mmol/L (ref 22–32)
Calcium: 8.8 mg/dL — ABNORMAL LOW (ref 8.9–10.3)
Chloride: 104 mmol/L (ref 98–111)
Creatinine, Ser: 1.37 mg/dL — ABNORMAL HIGH (ref 0.61–1.24)
GFR calc Af Amer: 58 mL/min — ABNORMAL LOW (ref >60)
GFR calc non Af Amer: 50 mL/min — ABNORMAL LOW (ref >60)
Glucose, Bld: 106 mg/dL — ABNORMAL HIGH (ref 70–99)
Potassium: 3.6 mmol/L (ref 3.5–5.1)
Sodium: 141 mmol/L (ref 135–145)
Total Bilirubin: 0.5 mg/dL (ref 0.3–1.2)
Total Protein: 7.5 g/dL (ref 6.5–8.1)

## 2019-03-24 LAB — CBC WITH DIFFERENTIAL/PLATELET
Abs Immature Granulocytes: 0.17 10*3/uL — ABNORMAL HIGH (ref 0.00–0.07)
Basophils Absolute: 0 10*3/uL (ref 0.0–0.1)
Basophils Relative: 0 %
Eosinophils Absolute: 0.1 10*3/uL (ref 0.0–0.5)
Eosinophils Relative: 1 %
HCT: 29.3 % — ABNORMAL LOW (ref 39.0–52.0)
Hemoglobin: 9.2 g/dL — ABNORMAL LOW (ref 13.0–17.0)
Immature Granulocytes: 1 %
Lymphocytes Relative: 32 %
Lymphs Abs: 3.9 10*3/uL (ref 0.7–4.0)
MCH: 32.6 pg (ref 26.0–34.0)
MCHC: 31.4 g/dL (ref 30.0–36.0)
MCV: 103.9 fL — ABNORMAL HIGH (ref 80.0–100.0)
Monocytes Absolute: 1.6 10*3/uL — ABNORMAL HIGH (ref 0.1–1.0)
Monocytes Relative: 13 %
Neutro Abs: 6.3 10*3/uL (ref 1.7–7.7)
Neutrophils Relative %: 53 %
Platelets: 569 10*3/uL — ABNORMAL HIGH (ref 150–400)
RBC: 2.82 MIL/uL — ABNORMAL LOW (ref 4.22–5.81)
RDW: 17.1 % — ABNORMAL HIGH (ref 11.5–15.5)
WBC: 12.1 10*3/uL — ABNORMAL HIGH (ref 4.0–10.5)
nRBC: 0 % (ref 0.0–0.2)

## 2019-03-24 LAB — MAGNESIUM: Magnesium: 1.9 mg/dL (ref 1.7–2.4)

## 2019-03-24 MED ORDER — SODIUM CHLORIDE 0.9 % IV SOLN
8.0000 mg | Freq: Once | INTRAVENOUS | Status: AC
  Administered 2019-03-24: 8 mg via INTRAVENOUS
  Filled 2019-03-24: qty 4

## 2019-03-24 MED ORDER — SODIUM CHLORIDE 0.9% FLUSH
10.0000 mL | INTRAVENOUS | Status: DC | PRN
  Administered 2019-03-24: 10 mL
  Filled 2019-03-24: qty 10

## 2019-03-24 MED ORDER — SODIUM CHLORIDE 0.9 % IV SOLN
Freq: Once | INTRAVENOUS | Status: AC
  Administered 2019-03-24: 10:00:00 via INTRAVENOUS

## 2019-03-24 MED ORDER — SODIUM CHLORIDE 0.9 % IV SOLN
400.0000 mg/m2 | Freq: Once | INTRAVENOUS | Status: AC
  Administered 2019-03-24: 900 mg via INTRAVENOUS
  Filled 2019-03-24: qty 16

## 2019-03-24 MED ORDER — HEPARIN SOD (PORK) LOCK FLUSH 100 UNIT/ML IV SOLN
500.0000 [IU] | Freq: Once | INTRAVENOUS | Status: AC | PRN
  Administered 2019-03-24: 500 [IU]

## 2019-03-24 NOTE — Progress Notes (Signed)
Proceed with treatment today. Labs reviewed. Alimta only per MD.   Treatment given per orders. Patient tolerated it well without problems. Vitals stable and discharged home from clinic via wheelchair. Follow up as scheduled.

## 2019-03-24 NOTE — Patient Instructions (Signed)
[place] at [facility] Discharge Instructions  You were seen today by Dr. Delton Coombes. He went over your recent scan and lab results. Your scan looks good. He will cut back on the chemotherapy that you are getting. Please continue taking your medications as directed. He will see you back in 3 weeks for labs, treatment and follow up.   Thank you for choosing [place] at [facility] to provide your oncology and hematology care.  To afford each patient quality time with our provider, please arrive at least 15 minutes before your scheduled appointment time.   If you have a lab appointment with the [facility] please come in thru the  Main Entrance and check in at the main information desk  You need to re-schedule your appointment should you arrive 10 or more minutes late.  We strive to give you quality time with our providers, and arriving late affects you and other patients whose appointments are after yours.  Also, if you no show three or more times for appointments you may be dismissed from the clinic at the providers discretion.     Again, thank you for choosing [facility].  Our hope is that these requests will decrease the amount of time that you wait before being seen by our physicians.       _____________________________________________________________  Should you have questions after your visit to [facility], please contact our office at [phone] between the hours of 8:00 a.m. and 4:30 p.m.  Voicemails left after 4:00 p.m. will not be returned until the following business day.  For prescription refill requests, have your pharmacy contact our office and allow 72 hours.    Cancer Center Support Programs:   > Cancer Support Group  2nd Tuesday of the month 1pm-2pm, Journey Room

## 2019-03-24 NOTE — Assessment & Plan Note (Signed)
1.  Stage IV adenocarcinoma of the lung, PDL 1-50%, other actionable mutations negative: - Keytruda from 05/17/2016 through 09/24/2018 with mild progression. -CT of the chest on 12/17/2018 shows posterior right upper lobe mass measured 4.1 x 3.2 cm, previously 5.2 x 3.9 cm.  Right paratracheal lymph node decreased from 12 mm to 9 mm.  No new areas were seen.  Right breast nodule also decreased in size. -4 cycles of carboplatin and pemetrexed from 12/25/2018 through 02/23/2019. -We reviewed results of CT of the chest with contrast from 03/22/2019.  Stable size of the right upper lobe lung mass measuring 4.9 x 3 cm when compared to scan from August.  Stable left adrenal nodule.  Stable small mediastinal nodes. -We will start him on pemetrexed maintenance at this time.  We are still in the process of getting him pembrolizumab from the drug company. -We have reviewed his labs.  We will see him back in 3 weeks for follow-up after today's treatment.  2.  Brain metastasis: -Right parietal lobe 16.6 x 13.5 x 15.5 mm lesion on MRI dated 11/12/2018 with significant surrounding edema and mass-effect. -SRS to the brain lesion on 11/24/2018. -We reviewed results of the brain MRI dated 03/11/2019 which showed right parietal operculum metastatic disease smaller, measuring 4.5 mm.  Marked improvement in vasogenic edema.  No new metastatic disease.  3.  Lower extremity swelling: -He is continuing Lasix 20 mg 2 tablets daily.  Swelling has improved.  4.  Hypomagnesemia: -He will continue magnesium twice daily.

## 2019-03-24 NOTE — Progress Notes (Signed)
[place] [place], [place] [address]   CLINIC:  Medical Oncology/Hematology  PCP:  Celene Squibb, MD [place] [address] [phone]   REASON FOR VISIT:  Follow-up for stage IV adenocarcinoma of the right lung   BRIEF ONCOLOGIC HISTORY:  Oncology History  Primary cancer of right upper lobe of lung ([place])  05/14/2015 Procedure   Port placement by Dr. Arnoldo Morale   04/01/2016 Initial Diagnosis   Primary cancer of right upper lobe of lung ([place])   04/02/2016 Procedure   Ultrasound-guided core biopsy performed of a solid 1.5 cm soft tissue nodule in the right chest wall.   04/03/2016 Imaging   MRI brain- No acute and intracranial process or metastasis.  Moderate chronic small vessel ischemic disease.   04/04/2016 Pathology Results   Diagnosis Soft Tissue Needle Core Biopsy, Right Chest Wall SMOOTH MUSCLE NEOPLASM Microscopic Comment The differential diagnosis include low grade leiomyosarcoma. The neoplasm shows spindle cell morphology with rare mitotic figures and minimal atypia, no necrosis present. These cells stains positive for smooth muscle actin, desmin, negative for cytokeratin AE1&3, ck8/18, s100, cd117, cd 34 and cd99. This case also reviewed by Dr. [name] and agree.   04/05/2016 Procedure   CT-guided biopsy of right upper lobe lesion, with tissue specimen sent to pathology, by IR.   04/09/2016 Pathology Results   Lung, needle/core biopsy(ies), Right Upper Lobe - NON SMALL CELL CARCINOMA.   04/17/2016 Pathology Results   PDL1 High Expression.  TPS 50%.   04/24/2016 PET scan   1. Prominently hypermetabolic large right upper lobe mass with metastatic disease to the right paratracheal and right hilar nodal chains, to the descending mesocolon, to the left upper buttock subcutaneous tissues, to the left upper lobe, to the right subscapularis muscle, and possibly to the left adrenal gland and right breast  subcutaneous tissues. Appearance compatible with stage IV lung cancer. 2. There is some focal high activity in the ascending colon which is probably from peristalsis, less likely from a local colon mass. This does raise the possibility that the adjacent mesocolon tumor implant could be due to synchronous colon cancer rather than lung metastasis. 3. Other imaging findings of potential clinical significance: Coronary, aortic arch, and branch vessel atherosclerotic vascular disease. Aortoiliac atherosclerotic vascular disease. Deformity left proximal humerus and left glenohumeral joint from prior trauma. Enlarged prostate gland.   04/26/2016 Pathology Results   FoundationONE: Genomic alterations identified- KRAS X83A, CREBBP splice site [id number], LRP1B S515*, AYO45 T97*, FSF42 splice site [id number], SPTA1 splice site [id number], TP53 C135Y.  Additional findings- MS-Stable, TMB-intermediate (14 Muts/Mb).  No reportable alterations- EGFR, ALK, BRAF, MET, ERBB2, RET, ROS1.   05/17/2016 -  Chemotherapy   The patient had ONDANSETRON IVPB [facility] +/- DEXAMETHASONE, , Intravenous,  Once, 0 of 1 cycle  pembrolizumab (KEYTRUDA) 200 mg in sodium chloride 0.9 % 50 mL chemo infusion, 200 mg, Intravenous, Once, 0 of 5 cycles  for chemotherapy treatment.     08/06/2016 PET scan   IMPRESSION: 1. Overall considerable improvement. The right upper lobe mass is markedly reduced in volume and SUV. Thoracic adenopathy have resolved and the hypermetabolic lesion in the right subscapularis muscle has also resolved. 2. The left upper lobe nodule is no longer appreciably hypermetabolic, and is highly indistinct although this may be due to motion artifact. Faint in indistinct nodularity elsewhere in the lungs likewise not currently hypermetabolic. 3. There is some very faint residual low-grade activity along the subcutaneous deposit  along the upper left buttock region. Marked reduction in size and activity of  the tumor deposit adjacent to the descending colon. 4. Prior right pleural effusion has resolved. 5. Stable appearance of the adrenal glands, with low-level activity and a nodule in the left adrenal gland. 6. Stable small nodule in the right breast, low-grade metabolic activity with maximum SUV 2.2 (formerly 3.0) 7. Other imaging findings of potential clinical significance: Severe arthropathy of the left glenohumeral joint. Coronary, aortic arch, and branch vessel atherosclerotic vascular disease. Aortoiliac atherosclerotic vascular disease. Prominent prostate gland indents the bladder base.    12/16/2018 -  Chemotherapy   The patient had palonosetron (ALOXI) injection 0.25 mg, 0.25 mg, Intravenous,  Once, 4 of 4 cycles Administration: 0.25 mg (12/16/2018), 0.25 mg (01/27/2019), 0.25 mg (03/03/2019), 0.25 mg (01/06/2019) pegfilgrastim (NEULASTA) injection 6 mg, 6 mg, Subcutaneous, Once, 2 of 2 cycles Administration: 6 mg (01/08/2019) pegfilgrastim (NEULASTA ONPRO KIT) injection 6 mg, 6 mg, Subcutaneous, Once, 2 of 2 cycles pegfilgrastim-cbqv (UDENYCA) injection 6 mg, 6 mg, Subcutaneous, Once, 2 of 2 cycles Administration: 6 mg (01/29/2019), 6 mg (03/05/2019) ondansetron (ZOFRAN) 8 mg in sodium chloride 0.9 % 50 mL IVPB, 8 mg (100 % of original dose 8 mg), Intravenous,  Once, 1 of 2 cycles Dose modification: 8 mg (original dose 8 mg, Cycle 5) PEMEtrexed (ALIMTA) 1,100 mg in sodium chloride 0.9 % 100 mL chemo infusion, 490 mg/m2 = 1,125 mg, Intravenous,  Once, 5 of 6 cycles Dose modification: 400 mg/m2 (80 % of original dose 500 mg/m2, Cycle 3, Reason: Other (see comments), Comment: rash, swelling) Administration: 1,100 mg (12/16/2018), 900 mg (01/27/2019), 900 mg (03/03/2019), 1,100 mg (01/06/2019) CARBOplatin (PARAPLATIN) 500 mg in sodium chloride 0.9 % 250 mL chemo infusion, 530 mg (110.7 % of original dose 477.5 mg), Intravenous,  Once, 4 of 4 cycles Dose modification:   (original dose 477.5 mg,  Cycle 1),   (original dose 485.5 mg, Cycle 3),   (original dose 494 mg, Cycle 4),   (original dose 494 mg, Cycle 2) Administration: 500 mg (12/16/2018), 490 mg (01/27/2019), 490 mg (03/03/2019), 490 mg (01/06/2019) fosaprepitant (EMEND) 150 mg, dexamethasone (DECADRON) 12 mg in sodium chloride 0.9 % 145 mL IVPB, , Intravenous,  Once, 4 of 4 cycles Administration:  (12/16/2018),  (01/27/2019),  (03/03/2019),  (01/06/2019)  for chemotherapy treatment.    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  Two adjacent large masslike areas of consolidation within the right upper lobe with differential considerations including malignancy or pneumonia. Multiple enlarged right hilar and mediastinal lymph nodes which may be reactive or metastatic in etiology.  Additional indeterminate pulmonary nodules as above. Recommend attention on follow-up.  Indeterminate 2.9 cm left adrenal nodule. This needs dedicated evaluation with pre and post contrast-enhanced CT or MRI after confirmation of pulmonary process.  Indeterminate nodule within the right breast. Recommend dedicated evaluation with mammography.  Hepatic steatosis.   04/02/2016 Procedure   Ultrasound-guided core biopsy performed of a solid 1.5 cm soft tissue nodule in the right chest wall.   04/04/2016 Pathology Results   Diagnosis Soft Tissue Needle Core Biopsy, Right Chest Wall SMOOTH MUSCLE NEOPLASM Microscopic Comment The differential diagnosis include low grade leiomyosarcoma. The neoplasm shows spindle cell morphology with rare mitotic figures and minimal atypia, no necrosis present. These cells stains positive for smooth muscle actin, desmin, negative for cytokeratin AE1&3, ck8/18, s100, cd117, cd  34 and cd99. This case also reviewed by Dr. [name] and agree.       CANCER STAGING: Cancer Staging  Primary cancer of right upper lobe of lung ([place]) Staging form: Lung, AJCC 8th Edition - Clinical: Stage IVB (cT3, cN2, cM1c) - Signed by Baird Cancer, PA-C on 05/07/2016    INTERVAL HISTORY:  Mr. Sallade 73 y.o. male seen for follow-up and toxicity assessment prior to maintenance embolism Mab.  He received cycle 4 of chemotherapy on 03/03/2019.  Appetite is 50%.  Energy levels are 25%.  Pain in the lower back region is 5 out of 10.  Ankle swellings are better as he is taking fluid pills.  His constipation is also well controlled with stool softener.  Numbness is stable in the extremities.  Cough and shortness of breath on exertion is also stable.  Denies any fevers or chills.  REVIEW OF SYSTEMS:  Review of Systems  Respiratory: Positive for cough and shortness of breath.   Gastrointestinal: Positive for constipation.  Neurological: Positive for numbness.  All other systems reviewed and are negative.    PAST MEDICAL/SURGICAL HISTORY:  Past Medical History:  Diagnosis Date  . Adrenal mass, left ([place]) 04/01/2016  . Diabetes mellitus ([place]) 05/02/2016  . Diabetes mellitus without complication ([place])   . Hypertension   . Mass of breast, right 04/01/2016  . Mass of upper lobe of right lung 04/01/2016   2 adjacent, 5 cm masses, hilar adenopathy  . Primary cancer of right upper lobe of lung ([place]) 04/01/2016   2 adjacent, 5 cm masses, hilar adenopathy  . Pulmonary embolus ([place]) 03/31/2016  . 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 COLONOSCOPY N/A 07/28/2017   Procedure: COLONOSCOPY;  Surgeon: Rogene Houston, MD;  Location: [facility] ENDO SUITE;  Service: Endoscopy;  Laterality: N/A;  205  . PORTACATH PLACEMENT Left 05/13/2016   Procedure: INSERTION PORT-A-CATH;  Surgeon: Aviva Signs, MD;  Location: [facility] ORS;  Service: General;  Laterality: Left;     SOCIAL HISTORY:  Social History   Socioeconomic History  . Marital status: Divorced    Spouse  name: Not on file  . Number of children: Not on file  . Years of education: Not on file  . Highest education level: Not on file  Occupational History  . Not on file  Social Needs  . Financial resource strain: Not on file  . Food insecurity    Worry: Not on file    Inability: Not on file  . Transportation needs    Medical: No    Non-medical: No  Tobacco Use  . Smoking status: Former Smoker    Years: 61.00    Quit date: 04/11/2014    Years since quitting: 4.9  . Smokeless tobacco: Never Used  . Tobacco comment: patient does vape smoking x 2 years  Substance and Sexual Activity  . Alcohol use: Not Currently    Comment: quit 10 years  . Drug use: No  . Sexual activity: Not on file  Lifestyle  . Physical activity    Days per week: Not on file    Minutes per session: Not on file  . Stress: Not on file  Relationships  . Social Herbalist on phone: Not on file    Gets together: Not on file    Attends religious service: Not on file    Active member of club or organization: Not on file  Attends meetings of clubs or organizations: Not on file    Relationship status: Not on file  . Intimate partner violence    Fear of current or ex partner: Not on file    Emotionally abused: Not on file    Physically abused: Not on file    Forced sexual activity: Not on file  Other Topics Concern  . Not on file  Social History Narrative  . Not on file    FAMILY HISTORY:  Family History  Problem Relation Age of Onset  . Stroke Mother   . Heart attack Father   . Heart attack Brother     CURRENT MEDICATIONS:  Outpatient Encounter Medications as of 03/24/2019  Medication Sig  . amLODipine (NORVASC) 2.5 MG tablet Take 2.5 mg by mouth daily.  Marland Kitchen CARBOPLATIN IV Inject into the vein every 21 ( twenty-one) days.  . folic acid (FOLVITE) 1 MG tablet Take 1 tablet (1 mg total) by mouth daily. Start 5-7 days before Alimta chemotherapy. Continue until 21 days after Alimta completed.  .  furosemide (LASIX) 20 MG tablet Take 2 tablets (40 mg total) by mouth daily.  Marland Kitchen GLIPIZIDE XL 5 MG 24 hr tablet Take 5 mg by mouth daily.  . magnesium oxide (MAG-OX) 400 (241.3 Mg) MG tablet Take 1 tablet (400 mg total) by mouth 2 (two) times daily.  . metFORMIN (GLUCOPHAGE) 500 MG tablet Take 500 mg by mouth 2 (two) times daily.  . metoprolol succinate (TOPROL-XL) 25 MG 24 hr tablet TAKE ONE TABLET BY MOUTH IN THE EVENING.  . Pembrolizumab (KEYTRUDA IV) Inject into the vein.  Marland Kitchen PEMEtrexed 500 mg/m2 in sodium chloride 0.9 % 100 mL Inject into the vein every 21 ( twenty-one) days.  . potassium chloride (K-DUR,KLOR-CON) 10 MEQ tablet Take 10 mEq by mouth daily.   . simvastatin (ZOCOR) 20 MG tablet Take 20 mg by mouth every evening.  . torsemide (DEMADEX) 20 MG tablet Take 1 tablet (20 mg total) by mouth daily.  Marland Kitchen ALPRAZolam (XANAX) 1 MG tablet Take 1 mg by mouth 3 (three) times daily as needed for anxiety.  . hydrocortisone 2.5 % lotion Apply topically as needed.   . lidocaine-prilocaine (EMLA) cream APPLY A QUARTER SIZE AMOUNT TO THE AFFECTED AREA 1 HOUR PRIOR TO COMING TO CHEMOTHERAPY. COVER WITH A PLASTIC WRAP. (Patient not taking: Reported on 03/24/2019)  . ondansetron (ZOFRAN) 4 MG tablet Take 1 tablet (4 mg total) by mouth every 8 (eight) hours as needed for nausea or vomiting. (Patient not taking: Reported on 02/17/2019)  . oxyCODONE-acetaminophen (PERCOCET) 10-325 MG tablet Take 1 tablet by mouth as needed.   . prochlorperazine (COMPAZINE) 10 MG tablet Take 1 tablet (10 mg total) by mouth every 6 (six) hours as needed (Nausea or vomiting). (Patient not taking: Reported on 02/04/2019)   No facility-administered encounter medications on file as of 03/24/2019.     ALLERGIES:  No Known Allergies   PHYSICAL EXAM:  ECOG Performance status: 1  Vitals:   03/24/19 0851  BP: (!) 146/80  Pulse: 91  Resp: 18  Temp: (!) 97.5 F (36.4 C)  SpO2: 98%   Filed Weights   03/24/19 0851  Weight:  204 lb 4.8 oz (92.7 kg)    Physical Exam Vitals signs reviewed.  Constitutional:      Appearance: Normal appearance.  Cardiovascular:     Rate and Rhythm: Normal rate and regular rhythm.     Heart sounds: Normal heart sounds.  Pulmonary:  Effort: Pulmonary effort is normal.     Breath sounds: Normal breath sounds.  Abdominal:     General: There is no distension.     Palpations: Abdomen is soft. There is no mass.  Musculoskeletal:        General: No swelling.  Skin:    General: Skin is warm.  Neurological:     General: No focal deficit present.     Mental Status: He is alert and oriented to person, place, and time.  Psychiatric:        Mood and Affect: Mood normal.        Behavior: Behavior normal.    No edema in the lower extremities.  LABORATORY DATA:  I have reviewed the labs as listed.  CBC    Component Value Date/Time   WBC 12.1 (H) 03/24/2019 0811   RBC 2.82 (L) 03/24/2019 0811   HGB 9.2 (L) 03/24/2019 0811   HGB 13.5 05/02/2016 0852   HCT 29.3 (L) 03/24/2019 0811   HCT 41.0 05/02/2016 0852   PLT 569 (H) 03/24/2019 0811   PLT 436 (H) 05/02/2016 0852   MCV 103.9 (H) 03/24/2019 0811   MCV 85.7 05/02/2016 0852   MCH 32.6 03/24/2019 0811   MCHC 31.4 03/24/2019 0811   RDW 17.1 (H) 03/24/2019 0811   RDW 13.9 05/02/2016 0852   LYMPHSABS 3.9 03/24/2019 0811   LYMPHSABS 3.4 (H) 05/02/2016 0852   MONOABS 1.6 (H) 03/24/2019 0811   MONOABS 1.2 (H) 05/02/2016 0852   EOSABS 0.1 03/24/2019 0811   EOSABS 1.6 (H) 05/02/2016 0852   BASOSABS 0.0 03/24/2019 0811   BASOSABS 0.2 (H) 05/02/2016 0852   CMP Latest Ref Rng & Units 03/24/2019 03/03/2019 02/17/2019  Glucose 70 - 99 mg/dL 106(H) 120(H) 96  BUN 8 - 23 mg/dL '13 16 20  '$ Creatinine 0.61 - 1.24 mg/dL 1.37(H) 1.28(H) 1.65(H)  Sodium 135 - 145 mmol/L 141 142 142  Potassium 3.5 - 5.1 mmol/L 3.6 3.6 4.6  Chloride 98 - 111 mmol/L 104 105 109  CO2 22 - 32 mmol/L '23 24 23  '$ Calcium 8.9 - 10.3 mg/dL 8.8(L) 9.4 9.0   Total Protein 6.5 - 8.1 g/dL 7.5 7.3 7.3  Total Bilirubin 0.3 - 1.2 mg/dL 0.5 0.5 0.4  Alkaline Phos 38 - 126 U/L 80 78 91  AST 15 - 41 U/L '23 26 30  '$ ALT 0 - 44 U/L 19 29 46(H)       DIAGNOSTIC IMAGING:  I have independently reviewed the scans and discussed with the patient.     ASSESSMENT & PLAN:   Primary cancer of right upper lobe of lung ([place]) 1.  Stage IV adenocarcinoma of the lung, PDL 1-50%, other actionable mutations negative: - Keytruda from 05/17/2016 through 09/24/2018 with mild progression. -CT of the chest on 12/17/2018 shows posterior right upper lobe mass measured 4.1 x 3.2 cm, previously 5.2 x 3.9 cm.  Right paratracheal lymph node decreased from 12 mm to 9 mm.  No new areas were seen.  Right breast nodule also decreased in size. -4 cycles of carboplatin and pemetrexed from 12/25/2018 through 02/23/2019. -We reviewed results of CT of the chest with contrast from 03/22/2019.  Stable size of the right upper lobe lung mass measuring 4.9 x 3 cm when compared to scan from August.  Stable left adrenal nodule.  Stable small mediastinal nodes. -We will start him on pemetrexed maintenance at this time.  We are still in the process of getting him pembrolizumab from the drug  company. -We have reviewed his labs.  We will see him back in 3 weeks for follow-up after today's treatment.  2.  Brain metastasis: -Right parietal lobe 16.6 x 13.5 x 15.5 mm lesion on MRI dated 11/12/2018 with significant surrounding edema and mass-effect. -SRS to the brain lesion on 11/24/2018. -We reviewed results of the brain MRI dated 03/11/2019 which showed right parietal operculum metastatic disease smaller, measuring 4.5 mm.  Marked improvement in vasogenic edema.  No new metastatic disease.  3.  Lower extremity swelling: -He is continuing Lasix 20 mg 2 tablets daily.  Swelling has improved.  4.  Hypomagnesemia: -He will continue magnesium twice daily.  Total time spent is 25 minutes with more than 50%  of the time spent face-to-face discussing scan results, change in treatment plan, counseling and coordination of care.    Orders placed this encounter:  No orders of the defined types were placed in this encounter.     Derek Jack, MD [place] [phone]

## 2019-03-24 NOTE — Patient Instructions (Signed)
[facility] Discharge Instructions for Patients Receiving Chemotherapy  Today you received the following chemotherapy agents   To help prevent nausea and vomiting after your treatment, we encourage you to take your nausea medication   If you develop nausea and vomiting that is not controlled by your nausea medication, call the clinic.   BELOW ARE SYMPTOMS THAT SHOULD BE REPORTED IMMEDIATELY:  *FEVER GREATER THAN 100.5 F  *CHILLS WITH OR WITHOUT FEVER  NAUSEA AND VOMITING THAT IS NOT CONTROLLED WITH YOUR NAUSEA MEDICATION  *UNUSUAL SHORTNESS OF BREATH  *UNUSUAL BRUISING OR BLEEDING  TENDERNESS IN MOUTH AND THROAT WITH OR WITHOUT PRESENCE OF ULCERS  *URINARY PROBLEMS  *BOWEL PROBLEMS  UNUSUAL RASH Items with * indicate a potential emergency and should be followed up as soon as possible.  Feel free to call the clinic should you have any questions or concerns. The clinic phone number is [phone].  Please show the CHEMO ALERT CARD at check-in to the Emergency Department and triage nurse.   

## 2019-03-26 ENCOUNTER — Ambulatory Visit (HOSPITAL_COMMUNITY): Payer: Medicare Other

## 2019-04-06 DIAGNOSIS — I10 Essential (primary) hypertension: Secondary | ICD-10-CM | POA: Diagnosis not present

## 2019-04-06 DIAGNOSIS — E119 Type 2 diabetes mellitus without complications: Secondary | ICD-10-CM | POA: Diagnosis not present

## 2019-04-06 DIAGNOSIS — E782 Mixed hyperlipidemia: Secondary | ICD-10-CM | POA: Diagnosis not present

## 2019-04-06 DIAGNOSIS — E1165 Type 2 diabetes mellitus with hyperglycemia: Secondary | ICD-10-CM | POA: Diagnosis not present

## 2019-04-13 DIAGNOSIS — E782 Mixed hyperlipidemia: Secondary | ICD-10-CM | POA: Diagnosis not present

## 2019-04-13 DIAGNOSIS — C3411 Malignant neoplasm of upper lobe, right bronchus or lung: Secondary | ICD-10-CM | POA: Diagnosis not present

## 2019-04-13 DIAGNOSIS — I10 Essential (primary) hypertension: Secondary | ICD-10-CM | POA: Diagnosis not present

## 2019-04-13 DIAGNOSIS — E1165 Type 2 diabetes mellitus with hyperglycemia: Secondary | ICD-10-CM | POA: Diagnosis not present

## 2019-04-14 ENCOUNTER — Inpatient Hospital Stay (HOSPITAL_BASED_OUTPATIENT_CLINIC_OR_DEPARTMENT_OTHER): Payer: Medicare Other | Admitting: Hematology

## 2019-04-14 ENCOUNTER — Inpatient Hospital Stay (HOSPITAL_COMMUNITY): Payer: Medicare Other

## 2019-04-14 ENCOUNTER — Encounter (HOSPITAL_COMMUNITY): Payer: Self-pay | Admitting: Hematology

## 2019-04-14 ENCOUNTER — Other Ambulatory Visit: Payer: Self-pay

## 2019-04-14 ENCOUNTER — Inpatient Hospital Stay (HOSPITAL_COMMUNITY): Payer: Medicare Other | Attending: Hematology

## 2019-04-14 VITALS — BP 143/83 | HR 75 | Temp 96.8°F | Resp 19

## 2019-04-14 VITALS — BP 157/71 | HR 71 | Temp 96.9°F | Resp 20 | Wt 204.2 lb

## 2019-04-14 DIAGNOSIS — C3411 Malignant neoplasm of upper lobe, right bronchus or lung: Secondary | ICD-10-CM

## 2019-04-14 DIAGNOSIS — M7989 Other specified soft tissue disorders: Secondary | ICD-10-CM | POA: Diagnosis not present

## 2019-04-14 DIAGNOSIS — Z79899 Other long term (current) drug therapy: Secondary | ICD-10-CM | POA: Diagnosis not present

## 2019-04-14 DIAGNOSIS — Z86711 Personal history of pulmonary embolism: Secondary | ICD-10-CM | POA: Diagnosis not present

## 2019-04-14 DIAGNOSIS — Z7984 Long term (current) use of oral hypoglycemic drugs: Secondary | ICD-10-CM | POA: Insufficient documentation

## 2019-04-14 DIAGNOSIS — Z5111 Encounter for antineoplastic chemotherapy: Secondary | ICD-10-CM | POA: Insufficient documentation

## 2019-04-14 DIAGNOSIS — E860 Dehydration: Secondary | ICD-10-CM | POA: Insufficient documentation

## 2019-04-14 DIAGNOSIS — E119 Type 2 diabetes mellitus without complications: Secondary | ICD-10-CM | POA: Insufficient documentation

## 2019-04-14 DIAGNOSIS — I10 Essential (primary) hypertension: Secondary | ICD-10-CM | POA: Insufficient documentation

## 2019-04-14 DIAGNOSIS — C7931 Secondary malignant neoplasm of brain: Secondary | ICD-10-CM | POA: Diagnosis not present

## 2019-04-14 DIAGNOSIS — Z87891 Personal history of nicotine dependence: Secondary | ICD-10-CM | POA: Insufficient documentation

## 2019-04-14 LAB — COMPREHENSIVE METABOLIC PANEL
ALT: 31 U/L (ref 0–44)
AST: 26 U/L (ref 15–41)
Albumin: 3.6 g/dL (ref 3.5–5.0)
Alkaline Phosphatase: 73 U/L (ref 38–126)
Anion gap: 14 (ref 5–15)
BUN: 11 mg/dL (ref 8–23)
CO2: 24 mmol/L (ref 22–32)
Calcium: 8.9 mg/dL (ref 8.9–10.3)
Chloride: 106 mmol/L (ref 98–111)
Creatinine, Ser: 1.43 mg/dL — ABNORMAL HIGH (ref 0.61–1.24)
GFR calc Af Amer: 56 mL/min — ABNORMAL LOW (ref >60)
GFR calc non Af Amer: 48 mL/min — ABNORMAL LOW (ref >60)
Glucose, Bld: 99 mg/dL (ref 70–99)
Potassium: 3.8 mmol/L (ref 3.5–5.1)
Sodium: 144 mmol/L (ref 135–145)
Total Bilirubin: 0.3 mg/dL (ref 0.3–1.2)
Total Protein: 7.2 g/dL (ref 6.5–8.1)

## 2019-04-14 LAB — CBC WITH DIFFERENTIAL/PLATELET
Abs Immature Granulocytes: 0.13 10*3/uL — ABNORMAL HIGH (ref 0.00–0.07)
Basophils Absolute: 0 10*3/uL (ref 0.0–0.1)
Basophils Relative: 0 %
Eosinophils Absolute: 0.3 10*3/uL (ref 0.0–0.5)
Eosinophils Relative: 2 %
HCT: 30.2 % — ABNORMAL LOW (ref 39.0–52.0)
Hemoglobin: 9.5 g/dL — ABNORMAL LOW (ref 13.0–17.0)
Immature Granulocytes: 1 %
Lymphocytes Relative: 36 %
Lymphs Abs: 4.1 10*3/uL — ABNORMAL HIGH (ref 0.7–4.0)
MCH: 33.3 pg (ref 26.0–34.0)
MCHC: 31.5 g/dL (ref 30.0–36.0)
MCV: 106 fL — ABNORMAL HIGH (ref 80.0–100.0)
Monocytes Absolute: 1.7 10*3/uL — ABNORMAL HIGH (ref 0.1–1.0)
Monocytes Relative: 15 %
Neutro Abs: 5 10*3/uL (ref 1.7–7.7)
Neutrophils Relative %: 46 %
Platelets: 446 10*3/uL — ABNORMAL HIGH (ref 150–400)
RBC: 2.85 MIL/uL — ABNORMAL LOW (ref 4.22–5.81)
RDW: 18.4 % — ABNORMAL HIGH (ref 11.5–15.5)
WBC: 11.2 10*3/uL — ABNORMAL HIGH (ref 4.0–10.5)
nRBC: 0 % (ref 0.0–0.2)

## 2019-04-14 LAB — TSH: TSH: 2.059 u[IU]/mL (ref 0.350–4.500)

## 2019-04-14 LAB — MAGNESIUM: Magnesium: 1.5 mg/dL — ABNORMAL LOW (ref 1.7–2.4)

## 2019-04-14 MED ORDER — SODIUM CHLORIDE 0.9 % IV SOLN
Freq: Once | INTRAVENOUS | Status: AC
  Administered 2019-04-14: 10:00:00 via INTRAVENOUS
  Filled 2019-04-14: qty 1000

## 2019-04-14 MED ORDER — HEPARIN SOD (PORK) LOCK FLUSH 100 UNIT/ML IV SOLN
500.0000 [IU] | Freq: Once | INTRAVENOUS | Status: AC
  Administered 2019-04-14: 500 [IU] via INTRAVENOUS

## 2019-04-14 MED ORDER — MAGNESIUM OXIDE 400 (241.3 MG) MG PO TABS: 400.0000 mg | ORAL_TABLET | Freq: Two times a day (BID) | ORAL | 2 refills | Status: AC

## 2019-04-14 MED ORDER — SODIUM CHLORIDE 0.9% FLUSH
10.0000 mL | Freq: Once | INTRAVENOUS | Status: AC
  Administered 2019-04-14: 09:00:00 10 mL via INTRAVENOUS

## 2019-04-14 NOTE — Progress Notes (Signed)
Magnesium 1.5 and Ser Creatinine 1.43 today.   Holding treatment today and hydration only verbal order Dr. Delton Coombes.    Patient tolerated hydration with no complaints voiced.  Port site clean and dry with good blood return noted before and after hydration.  No bruising or swelling noted with port.  Band aid applied.  VSS with discharge and left ambulatory with no s/s of distress noted.

## 2019-04-14 NOTE — Progress Notes (Signed)
Darrell Christensen, [place] [address]   CLINIC:  Medical Oncology/Hematology  PCP:  Celene Squibb, MD [place] [address] [phone]   REASON FOR VISIT:  Follow-up for stage IV adenocarcinoma of the right lung   BRIEF ONCOLOGIC HISTORY:  Oncology History  Primary cancer of right upper lobe of lung ([place])  05/14/2015 Procedure   Port placement by Dr. Arnoldo Morale   04/01/2016 Initial Diagnosis   Primary cancer of right upper lobe of lung ([place])   04/02/2016 Procedure   Ultrasound-guided core biopsy performed of a solid 1.5 cm soft tissue nodule in the right chest wall.   04/03/2016 Imaging   MRI brain- No acute and intracranial process or metastasis.  Moderate chronic small vessel ischemic disease.   04/04/2016 Pathology Results   Diagnosis Soft Tissue Needle Core Biopsy, Right Chest Wall SMOOTH MUSCLE NEOPLASM Microscopic Comment The differential diagnosis include low grade leiomyosarcoma. The neoplasm shows spindle cell morphology with rare mitotic figures and minimal atypia, no necrosis present. These cells stains positive for smooth muscle actin, desmin, negative for cytokeratin AE1&3, ck8/18, s100, cd117, cd 34 and cd99. This case also reviewed by Dr. [name] and agree.   04/05/2016 Procedure   CT-guided biopsy of right upper lobe lesion, with tissue specimen sent to pathology, by IR.   04/09/2016 Pathology Results   Lung, needle/core biopsy(ies), Right Upper Lobe - NON SMALL CELL CARCINOMA.   04/17/2016 Pathology Results   PDL1 High Expression.  TPS 50%.   04/24/2016 PET scan   1. Prominently hypermetabolic large right upper lobe mass with metastatic disease to the right paratracheal and right hilar nodal chains, to the descending mesocolon, to the left upper buttock subcutaneous tissues, to the left upper lobe, to the right subscapularis muscle, and possibly to the left adrenal gland and right breast  subcutaneous tissues. Appearance compatible with stage IV lung cancer. 2. There is some focal high activity in the ascending colon which is probably from peristalsis, less likely from a local colon mass. This does raise the possibility that the adjacent mesocolon tumor implant could be due to synchronous colon cancer rather than lung metastasis. 3. Other imaging findings of potential clinical significance: Coronary, aortic arch, and branch vessel atherosclerotic vascular disease. Aortoiliac atherosclerotic vascular disease. Deformity left proximal humerus and left glenohumeral joint from prior trauma. Enlarged prostate gland.   04/26/2016 Pathology Results   FoundationONE: Genomic alterations identified- KRAS X83A, CREBBP splice site [id number], LRP1B S515*, AYO45 T97*, FSF42 splice site [id number], SPTA1 splice site [id number]>[id number], TP53 C135Y.  Additional findings- MS-Stable, TMB-intermediate (14 Muts/Mb).  No reportable alterations- EGFR, ALK, BRAF, MET, ERBB2, RET, ROS1.   05/17/2016 -  Chemotherapy   The patient had ONDANSETRON IVPB [facility] +/- DEXAMETHASONE, , Intravenous,  Once, 0 of 1 cycle  pembrolizumab (KEYTRUDA) 200 mg in sodium chloride 0.9 % 50 mL chemo infusion, 200 mg, Intravenous, Once, 0 of 5 cycles  for chemotherapy treatment.     08/06/2016 PET scan   IMPRESSION: 1. Overall considerable improvement. The right upper lobe mass is markedly reduced in volume and SUV. Thoracic adenopathy have resolved and the hypermetabolic lesion in the right subscapularis muscle has also resolved. 2. The left upper lobe nodule is no longer appreciably hypermetabolic, and is highly indistinct although this may be due to motion artifact. Faint in indistinct nodularity elsewhere in the lungs likewise not currently hypermetabolic. 3. There is some very faint residual low-grade activity along the subcutaneous deposit  along the upper left buttock region. Marked reduction in size and activity of  the tumor deposit adjacent to the descending colon. 4. Prior right pleural effusion has resolved. 5. Stable appearance of the adrenal glands, with low-level activity and a nodule in the left adrenal gland. 6. Stable small nodule in the right breast, low-grade metabolic activity with maximum SUV 2.2 (formerly 3.0) 7. Other imaging findings of potential clinical significance: Severe arthropathy of the left glenohumeral joint. Coronary, aortic arch, and branch vessel atherosclerotic vascular disease. Aortoiliac atherosclerotic vascular disease. Prominent prostate gland indents the bladder base.    12/16/2018 -  Chemotherapy   The patient had palonosetron (ALOXI) injection 0.25 mg, 0.25 mg, Intravenous,  Once, 4 of 4 cycles Administration: 0.25 mg (12/16/2018), 0.25 mg (01/27/2019), 0.25 mg (03/03/2019), 0.25 mg (01/06/2019) pegfilgrastim (NEULASTA) injection 6 mg, 6 mg, Subcutaneous, Once, 2 of 2 cycles Administration: 6 mg (01/08/2019) pegfilgrastim (NEULASTA ONPRO KIT) injection 6 mg, 6 mg, Subcutaneous, Once, 2 of 2 cycles pegfilgrastim-cbqv (UDENYCA) injection 6 mg, 6 mg, Subcutaneous, Once, 2 of 2 cycles Administration: 6 mg (01/29/2019), 6 mg (03/05/2019) ondansetron (ZOFRAN) 8 mg in sodium chloride 0.9 % 50 mL IVPB, 8 mg (100 % of original dose 8 mg), Intravenous,  Once, 2 of 6 cycles Dose modification: 8 mg (original dose 8 mg, Cycle 5) Administration: 8 mg (03/24/2019), 8 mg (04/16/2019) PEMEtrexed (ALIMTA) 1,100 mg in sodium chloride 0.9 % 100 mL chemo infusion, 490 mg/m2 = 1,125 mg, Intravenous,  Once, 6 of 10 cycles Dose modification: 400 mg/m2 (80 % of original dose 500 mg/m2, Cycle 3, Reason: Other (see comments), Comment: rash, swelling) Administration: 1,100 mg (12/16/2018), 900 mg (01/27/2019), 900 mg (03/03/2019), 900 mg (03/24/2019), 900 mg (04/16/2019), 1,100 mg (01/06/2019) CARBOplatin (PARAPLATIN) 500 mg in sodium chloride 0.9 % 250 mL chemo infusion, 530 mg (110.7 % of original  dose 477.5 mg), Intravenous,  Once, 4 of 4 cycles Dose modification:   (original dose 477.5 mg, Cycle 1),   (original dose 485.5 mg, Cycle 3),   (original dose 494 mg, Cycle 4),   (original dose 494 mg, Cycle 2) Administration: 500 mg (12/16/2018), 490 mg (01/27/2019), 490 mg (03/03/2019), 490 mg (01/06/2019) pembrolizumab (KEYTRUDA) 200 mg in sodium chloride 0.9 % 50 mL chemo infusion, 2 mg/kg = 200 mg, Intravenous, Once, 1 of 5 cycles Administration: 200 mg (04/16/2019) fosaprepitant (EMEND) 150 mg, dexamethasone (DECADRON) 12 mg in sodium chloride 0.9 % 145 mL IVPB, , Intravenous,  Once, 4 of 4 cycles Administration:  (12/16/2018),  (01/27/2019),  (03/03/2019),  (01/06/2019)  for chemotherapy treatment.    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  Two adjacent large masslike areas of consolidation within the right upper lobe with differential considerations including malignancy or pneumonia. Multiple enlarged right hilar and mediastinal lymph nodes which may be reactive or metastatic in etiology.  Additional indeterminate pulmonary nodules as above. Recommend attention on follow-up.  Indeterminate 2.9 cm left adrenal nodule. This needs dedicated evaluation with pre and post contrast-enhanced CT or MRI after confirmation of pulmonary process.  Indeterminate nodule within the right breast. Recommend dedicated evaluation with mammography.  Hepatic steatosis.   04/02/2016 Procedure   Ultrasound-guided core biopsy performed of a solid 1.5 cm soft tissue nodule in the right chest wall.   04/04/2016 Pathology Results   Diagnosis Soft Tissue Needle Core Biopsy, Right Chest Wall SMOOTH MUSCLE NEOPLASM Microscopic  Comment The differential diagnosis include low grade leiomyosarcoma. The neoplasm shows spindle cell morphology with rare mitotic figures and  minimal atypia, no necrosis present. These cells stains positive for smooth muscle actin, desmin, negative for cytokeratin AE1&3, ck8/18, s100, cd117, cd 34 and cd99. This case also reviewed by Dr. [name] and agree.       CANCER STAGING: Cancer Staging Primary cancer of right upper lobe of lung ([place]) Staging form: Lung, AJCC 8th Edition - Clinical: Stage IVB (cT3, cN2, cM1c) - Signed by Baird Cancer, PA-C on 05/07/2016    INTERVAL HISTORY:  Darrell Christensen 73 y.o. male seen for follow-up and toxicity assessment prior to pembrolizumab and pemetrexed.  Reports drinking adequate fluids.  Lasix is being taken once a day.  He is taking magnesium twice daily.  He is accompanied by his daughter.  Reports decreased appetite and energy levels.  Occasional dizziness has been stable.  Reports watering of his eyes.  Mild constipation is stable.  Mild leg swelling is also stable.  Numbness in the hands and feet has not gotten any worse.  REVIEW OF SYSTEMS:  Review of Systems  Eyes: Positive for eye problems.  Respiratory: Positive for shortness of breath.   Gastrointestinal: Positive for constipation.  Neurological: Positive for numbness.  All other systems reviewed and are negative.    PAST MEDICAL/SURGICAL HISTORY:  Past Medical History:  Diagnosis Date  . Adrenal mass, left ([place]) 04/01/2016  . Diabetes mellitus ([place]) 05/02/2016  . Diabetes mellitus without complication ([place])   . Hypertension   . Mass of breast, right 04/01/2016  . Mass of upper lobe of right lung 04/01/2016   2 adjacent, 5 cm masses, hilar adenopathy  . Primary cancer of right upper lobe of lung ([place]) 04/01/2016   2 adjacent, 5 cm masses, hilar adenopathy  . Pulmonary embolus ([place]) 03/31/2016  . 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 COLONOSCOPY N/A 07/28/2017   Procedure: COLONOSCOPY;  Surgeon: Rogene Houston, MD;  Location: [facility] END[facility] SUITE;  Service: Endoscopy;   Laterality: N/A;  205  . PORTACATH PLACEMENT Left 05/13/2016   Procedure: INSERTION PORT-A-CATH;  Surgeon: Aviva Signs, MD;  Location: [facility] ORS;  Service: General;  Laterality: Left;     SOCIAL HISTORY:  Social History   Socioeconomic History  . Marital status: Divorced    Spouse name: Not on file  . Number of children: Not on file  . Years of education: Not on file  . Highest education level: Not on file  Occupational History  . Not on file  Tobacco Use  . Smoking status: Former Smoker    Years: 61.00    Quit date: 04/11/2014    Years since quitting: 5.0  . Smokeless tobacco: Never Used  . Tobacco comment: patient does vape smoking x 2 years  Substance and Sexual Activity  . Alcohol use: Not Currently    Comment: quit 10 years  . Drug use: No  . Sexual activity: Not on file  Other Topics Concern  . Not on file  Social History Narrative  . Not on file   Social Determinants of Health   Financial Resource Strain:   . Difficulty of Paying Living Expenses: Not on file  Food Insecurity:   . Worried About Charity fundraiser in the Last Year: Not on file  . Ran Out of Food in the Last Year: Not on file  Transportation Needs: No Transportation Needs  .  Lack of Transportation (Medical): No  . Lack of Transportation (Non-Medical): No  Physical Activity:   . Days of Exercise per Week: Not on file  . Minutes of Exercise per Session: Not on file  Stress:   . Feeling of Stress : Not on file  Social Connections:   . Frequency of Communication with Friends and Family: Not on file  . Frequency of Social Gatherings with Friends and Family: Not on file  . Attends Religious Services: Not on file  . Active Member of Clubs or Organizations: Not on file  . Attends Archivist Meetings: Not on file  . Marital Status: Not on file  Intimate Partner Violence:   . Fear of Current or Ex-Partner: Not on file  . Emotionally Abused: Not on file  . Physically Abused: Not on file  .  Sexually Abused: Not on file    FAMILY HISTORY:  Family History  Problem Relation Age of Onset  . Stroke Mother   . Heart attack Father   . Heart attack Brother     CURRENT MEDICATIONS:  Outpatient Encounter Medications as of 04/14/2019  Medication Sig  . ALPRAZolam (XANAX) 1 MG tablet Take 1 mg by mouth 3 (three) times daily as needed for anxiety.  Marland Kitchen amLODipine (NORVASC) 2.5 MG tablet Take 2.5 mg by mouth daily.  Marland Kitchen CARBOPLATIN IV Inject into the vein every 21 ( twenty-one) days.  . folic acid (FOLVITE) 1 MG tablet Take 1 tablet (1 mg total) by mouth daily. Start 5-7 days before Alimta chemotherapy. Continue until 21 days after Alimta completed.  . magnesium oxide (MAG-OX) 400 (241.3 Mg) MG tablet Take 1 tablet (400 mg total) by mouth 2 (two) times daily.  . metFORMIN (GLUCOPHAGE) 500 MG tablet Take 500 mg by mouth 2 (two) times daily.  . metoprolol succinate (TOPROL-XL) 25 MG 24 hr tablet TAKE ONE TABLET BY MOUTH IN THE EVENING.  . Pembrolizumab (KEYTRUDA IV) Inject into the vein.  Marland Kitchen PEMEtrexed 500 mg/m2 in sodium chloride 0.9 % 100 mL Inject into the vein every 21 ( twenty-one) days.  . simvastatin (ZOCOR) 20 MG tablet Take 20 mg by mouth every evening.  . torsemide (DEMADEX) 20 MG tablet Take 1 tablet (20 mg total) by mouth daily.  . [DISCONTINUED] magnesium oxide (MAG-OX) 400 (241.3 Mg) MG tablet Take 1 tablet (400 mg total) by mouth 2 (two) times daily.  . furosemide (LASIX) 20 MG tablet Take 2 tablets (40 mg total) by mouth daily. (Patient not taking: Reported on 04/14/2019)  . GLIPIZIDE XL 5 MG 24 hr tablet Take 5 mg by mouth daily.  . hydrocortisone 2.5 % lotion Apply topically as needed.   . lidocaine-prilocaine (EMLA) cream APPLY A QUARTER SIZE AMOUNT TO THE AFFECTED AREA 1 HOUR PRIOR TO COMING TO CHEMOTHERAPY. COVER WITH A PLASTIC WRAP. (Patient not taking: Reported on 03/24/2019)  . ondansetron (ZOFRAN) 4 MG tablet Take 1 tablet (4 mg total) by mouth every 8 (eight) hours as  needed for nausea or vomiting. (Patient not taking: Reported on 02/17/2019)  . oxyCODONE-acetaminophen (PERCOCET) 10-325 MG tablet Take 1 tablet by mouth as needed.   . potassium chloride (K-DUR,KLOR-CON) 10 MEQ tablet Take 10 mEq by mouth daily.   . prochlorperazine (COMPAZINE) 10 MG tablet Take 1 tablet (10 mg total) by mouth every 6 (six) hours as needed (Nausea or vomiting). (Patient not taking: Reported on 02/04/2019)   No facility-administered encounter medications on file as of 04/14/2019.    ALLERGIES:  No  Known Allergies   PHYSICAL EXAM:  ECOG Performance status: 1  Vitals:   04/14/19 0843 04/14/19 0900  BP: (!) 157/71 (!) 157/71  Pulse: 71 71  Resp: 18 20  Temp: (!) 96.9 F (36.1 C) (!) 96.9 F (36.1 C)  SpO2: 98% 98%   Filed Weights   04/14/19 0843 04/14/19 0900  Weight: 204 lb 3.2 oz (92.6 kg) 204 lb 3.2 oz (92.6 kg)    Physical Exam Vitals signs reviewed.  Constitutional:      Appearance: Normal appearance.  Cardiovascular:     Rate and Rhythm: Normal rate and regular rhythm.     Heart sounds: Normal heart sounds.  Pulmonary:     Effort: Pulmonary effort is normal.     Breath sounds: Normal breath sounds.  Abdominal:     General: There is no distension.     Palpations: Abdomen is soft. There is no mass.  Musculoskeletal:        General: No swelling.  Skin:    General: Skin is warm.  Neurological:     General: No focal deficit present.     Mental Status: He is alert and oriented to person, place, and time.  Psychiatric:        Mood and Affect: Mood normal.        Behavior: Behavior normal.      LABORATORY DATA:  I have reviewed the labs as listed.  CBC    Component Value Date/Time   WBC 11.2 (H) 04/14/2019 0821   RBC 2.85 (L) 04/14/2019 0821   HGB 9.5 (L) 04/14/2019 0821   HGB 13.5 05/02/2016 0852   HCT 30.2 (L) 04/14/2019 0821   HCT 41.0 05/02/2016 0852   PLT 446 (H) 04/14/2019 0821   PLT 436 (H) 05/02/2016 0852   MCV 106.0 (H)  04/14/2019 0821   MCV 85.7 05/02/2016 0852   MCH 33.3 04/14/2019 0821   MCHC 31.5 04/14/2019 0821   RDW 18.4 (H) 04/14/2019 0821   RDW 13.9 05/02/2016 0852   LYMPHSABS 4.1 (H) 04/14/2019 0821   LYMPHSABS 3.4 (H) 05/02/2016 0852   MONOABS 1.7 (H) 04/14/2019 0821   MONOABS 1.2 (H) 05/02/2016 0852   EOSABS 0.3 04/14/2019 0821   EOSABS 1.6 (H) 05/02/2016 0852   BASOSABS 0.0 04/14/2019 0821   BASOSABS 0.2 (H) 05/02/2016 0852   CMP Latest Ref Rng & Units 04/16/2019 04/14/2019 03/24/2019  Glucose 70 - 99 mg/dL 113(H) 99 106(H)  BUN 8 - 23 mg/dL '12 11 13  '$ Creatinine 0.61 - 1.24 mg/dL 1.34(H) 1.43(H) 1.37(H)  Sodium 135 - 145 mmol/L 143 144 141  Potassium 3.5 - 5.1 mmol/L 3.6 3.8 3.6  Chloride 98 - 111 mmol/L 105 106 104  CO2 22 - 32 mmol/L '26 24 23  '$ Calcium 8.9 - 10.3 mg/dL 8.9 8.9 8.8(L)  Total Protein 6.5 - 8.1 g/dL - 7.2 7.5  Total Bilirubin 0.3 - 1.2 mg/dL - 0.3 0.5  Alkaline Phos 38 - 126 U/L - 73 80  AST 15 - 41 U/L - 26 23  ALT 0 - 44 U/L - 31 19       DIAGNOSTIC IMAGING:  I have independently reviewed the scans and discussed with the patient.     ASSESSMENT & PLAN:   Primary cancer of right upper lobe of lung ([place]) 1.  Stage IV adenocarcinoma of the lung, PDL 1-50%, other actionable mutations negative: - Keytruda from 05/17/2016 through 09/24/2018 with mild progression. -CT of the chest on 12/17/2018 shows posterior right  upper lobe mass measured 4.1 x 3.2 cm, previously 5.2 x 3.9 cm.  Right paratracheal lymph node decreased from 12 mm to 9 mm.  No new areas were seen.  Right breast nodule also decreased in size. -4 cycles of carboplatin and pemetrexed from 12/25/2018 through 02/23/2019. -We reviewed results of CT of the chest with contrast from 03/22/2019.  Stable size of the right upper lobe lung mass measuring 4.9 x 3 cm when compared to scan from August.  Stable left adrenal nodule.  Stable small mediastinal nodes. -Pemetrexed maintenance started on 03/24/2019. -Today  his creatinine is elevated at 1.43.  GFR is 42 mL/min. -We will give him fluids.  I will hold his pemetrexed. -We will check his labs on Friday.  If GFR improves to 45, he will proceed with pemetrexed.  His insurance apparently approved pembrolizumab.  We will also give pembrolizumab at that time. -Reports watering of the eyes.  I have told him to use artificial tears. -He will come back in 3 weeks for follow-up.  2.  Brain metastasis: -Right parietal lobe 16.6 x 13.5 x 15.5 mm lesion on MRI dated 11/12/2018 with significant surrounding edema and mass-effect. -SRS to the brain lesion on 11/24/2018. -We reviewed results of the brain MRI dated 03/11/2019 which showed right parietal operculum metastatic disease smaller, measuring 4.5 mm.  Marked improvement in vasogenic edema.  No new metastatic disease.  3.  Lower extremity swelling: -He is taking Lasix 20 mg once daily.  4.  Hypomagnesemia: -Magnesium is 1.5.  We will replete magnesium today. -We will increase magnesium to 3 times a day.      Orders placed this encounter:  Orders Placed This Encounter  Procedures  . Basic metabolic panel  . Magnesium      Derek Jack, MD [place] [phone]

## 2019-04-14 NOTE — Patient Instructions (Addendum)
[place] at [facility] Discharge Instructions  You were seen today by Dr. Delton Coombes. He went over your recent lab results. He will see you back in 2 days for labs and possible treatment.  Continue taking magnesium 1 tablet twice a day. Continue taking potassium as directed.   Thank you for choosing [place] at [facility] to provide your oncology and hematology care.  To afford each patient quality time with our provider, please arrive at least 15 minutes before your scheduled appointment time.   If you have a lab appointment with the [place] please come in thru the  Main Entrance and check in at the main information desk  You need to re-schedule your appointment should you arrive 10 or more minutes late.  We strive to give you quality time with our providers, and arriving late affects you and other patients whose appointments are after yours.  Also, if you no show three or more times for appointments you may be dismissed from the clinic at the providers discretion.     Again, thank you for choosing [facility].  Our hope is that these requests will decrease the amount of time that you wait before being seen by our physicians.       _____________________________________________________________  Should you have questions after your visit to [facility], please contact our office at [phone] between the hours of 8:00 a.m. and 4:30 p.m.  Voicemails left after 4:00 p.m. will not be returned until the following business day.  For prescription refill requests, have your pharmacy contact our office and allow 72 hours.    Cancer Center Support Programs:   > Cancer Support Group  2nd Tuesday of the month 1pm-2pm, Journey Room

## 2019-04-16 ENCOUNTER — Inpatient Hospital Stay (HOSPITAL_COMMUNITY): Payer: Medicare Other

## 2019-04-16 ENCOUNTER — Ambulatory Visit (HOSPITAL_COMMUNITY): Payer: Medicare Other

## 2019-04-16 ENCOUNTER — Other Ambulatory Visit: Payer: Self-pay

## 2019-04-16 VITALS — BP 143/74 | HR 70 | Temp 97.7°F | Resp 18 | Wt 204.0 lb

## 2019-04-16 DIAGNOSIS — I10 Essential (primary) hypertension: Secondary | ICD-10-CM | POA: Diagnosis not present

## 2019-04-16 DIAGNOSIS — C3411 Malignant neoplasm of upper lobe, right bronchus or lung: Secondary | ICD-10-CM | POA: Diagnosis not present

## 2019-04-16 DIAGNOSIS — M7989 Other specified soft tissue disorders: Secondary | ICD-10-CM | POA: Diagnosis not present

## 2019-04-16 DIAGNOSIS — Z87891 Personal history of nicotine dependence: Secondary | ICD-10-CM | POA: Diagnosis not present

## 2019-04-16 DIAGNOSIS — Z7984 Long term (current) use of oral hypoglycemic drugs: Secondary | ICD-10-CM | POA: Diagnosis not present

## 2019-04-16 DIAGNOSIS — Z79899 Other long term (current) drug therapy: Secondary | ICD-10-CM | POA: Diagnosis not present

## 2019-04-16 DIAGNOSIS — Z86711 Personal history of pulmonary embolism: Secondary | ICD-10-CM | POA: Diagnosis not present

## 2019-04-16 DIAGNOSIS — E119 Type 2 diabetes mellitus without complications: Secondary | ICD-10-CM | POA: Diagnosis not present

## 2019-04-16 DIAGNOSIS — C7931 Secondary malignant neoplasm of brain: Secondary | ICD-10-CM | POA: Diagnosis not present

## 2019-04-16 DIAGNOSIS — Z5111 Encounter for antineoplastic chemotherapy: Secondary | ICD-10-CM | POA: Diagnosis not present

## 2019-04-16 LAB — BASIC METABOLIC PANEL
Anion gap: 12 (ref 5–15)
BUN: 12 mg/dL (ref 8–23)
CO2: 26 mmol/L (ref 22–32)
Calcium: 8.9 mg/dL (ref 8.9–10.3)
Chloride: 105 mmol/L (ref 98–111)
Creatinine, Ser: 1.34 mg/dL — ABNORMAL HIGH (ref 0.61–1.24)
GFR calc Af Amer: >60 mL/min (ref >60)
GFR calc non Af Amer: 52 mL/min — ABNORMAL LOW (ref >60)
Glucose, Bld: 113 mg/dL — ABNORMAL HIGH (ref 70–99)
Potassium: 3.6 mmol/L (ref 3.5–5.1)
Sodium: 143 mmol/L (ref 135–145)

## 2019-04-16 LAB — MAGNESIUM: Magnesium: 1.9 mg/dL (ref 1.7–2.4)

## 2019-04-16 MED ORDER — SODIUM CHLORIDE 0.9% FLUSH
10.0000 mL | INTRAVENOUS | Status: DC | PRN
  Administered 2019-04-16: 10 mL

## 2019-04-16 MED ORDER — SODIUM CHLORIDE 0.9 % IV SOLN
8.0000 mg | Freq: Once | INTRAVENOUS | Status: AC
  Administered 2019-04-16: 8 mg via INTRAVENOUS
  Filled 2019-04-16: qty 4

## 2019-04-16 MED ORDER — SODIUM CHLORIDE 0.9 % IV SOLN
2.0000 mg/kg | Freq: Once | INTRAVENOUS | Status: AC
  Administered 2019-04-16: 200 mg via INTRAVENOUS
  Filled 2019-04-16: qty 8

## 2019-04-16 MED ORDER — SODIUM CHLORIDE 0.9 % IV SOLN
INTRAVENOUS | Status: DC
  Administered 2019-04-16: 09:00:00 via INTRAVENOUS

## 2019-04-16 MED ORDER — SODIUM CHLORIDE 0.9 % IV SOLN
400.0000 mg/m2 | Freq: Once | INTRAVENOUS | Status: AC
  Administered 2019-04-16: 900 mg via INTRAVENOUS
  Filled 2019-04-16: qty 16

## 2019-04-16 MED ORDER — SODIUM CHLORIDE 0.9 % IV SOLN
Freq: Once | INTRAVENOUS | Status: AC
  Administered 2019-04-16: 09:00:00 via INTRAVENOUS

## 2019-04-16 MED ORDER — HEPARIN SOD (PORK) LOCK FLUSH 100 UNIT/ML IV SOLN
500.0000 [IU] | Freq: Once | INTRAVENOUS | Status: AC | PRN
  Administered 2019-04-16: 500 [IU]

## 2019-04-16 MED ORDER — CYANOCOBALAMIN 1000 MCG/ML IJ SOLN
1000.0000 ug | Freq: Once | INTRAMUSCULAR | Status: AC
  Administered 2019-04-16: 1000 ug via INTRAMUSCULAR
  Filled 2019-04-16: qty 1

## 2019-04-16 NOTE — Progress Notes (Signed)
Nutrition Follow-up:  Patient with stage IV adenocarcinoma of lung.  Patient receiving keytruda and alimta.    Met with patient today in clinic.  Patient reports that his appetite is about the same.  Reports eating eggs, bacon, toast and gravy for breakfast this am before coming to clinic.  Does not cook unless heats up something in microwave.  Reports usually eats 1 good meal per day.  May go get hamburger and fries or chicken sandwich or meat and 2 vegetable plate at [employer].    Patient denies nausea, mild constipation but takes stool softner when needed.    Noted that patient is on [employer] food distribution list.   Medications: reviewed  Labs: reviewed  Anthropometrics:   Weight 204 lb 3.2 oz on 12/9, stable from 205 lb on 10/28  200 lb on 10/14   NUTRITION DIAGNOSIS: Inadequate oral intake stable with weight stablity   INTERVENTION:  Encouraged patient to eat more frequently than 1 time per day.  We discussed snack options and frozen meals, soups, to keep at home.   Patient has not tried ensure again or boost.  Had previously thought diarrhea was associated with drinking ensure.  Patient can contact RD if needed  MONITORING, EVALUATION, GOAL: Patient will consume adequate calories and protein to maintain weight and lean muscle mass   NEXT VISIT: phone f/u Jan 8th  Amariah Kierstead B. Zenia Resides, [place], [place] Registered Dietitian [phone] (pager)

## 2019-04-16 NOTE — Progress Notes (Signed)
Labs reviewed today. Per pharmacy and [place] NP will treat today and give 500 ml bolus of fluids.   Treatment given per orders. Patient tolerated it well without problems. Vitals stable and discharged home from clinic ambulatory. Follow up as scheduled.

## 2019-04-16 NOTE — Progress Notes (Signed)
04/16/19  For treatment today:  Review labs 04/16/19 - If CrCl is >45 give Alimta and Keytruda If <45 hold Alimta and give Keytruda with 1 liter NS over 2 hours.  Lab results: scr 1.34 with calculated CrCl of 63 ml/min - ok to proceed with Alimta and Keytruda.  V.O. Dr Alphonzo Severance Higinio Roger

## 2019-04-17 ENCOUNTER — Encounter (HOSPITAL_COMMUNITY): Payer: Self-pay | Admitting: Hematology

## 2019-04-17 NOTE — Assessment & Plan Note (Addendum)
1.  Stage IV adenocarcinoma of the lung, PDL 1-50%, other actionable mutations negative: - Keytruda from 05/17/2016 through 09/24/2018 with mild progression. -CT of the chest on 12/17/2018 shows posterior right upper lobe mass measured 4.1 x 3.2 cm, previously 5.2 x 3.9 cm.  Right paratracheal lymph node decreased from 12 mm to 9 mm.  No new areas were seen.  Right breast nodule also decreased in size. -4 cycles of carboplatin and pemetrexed from 12/25/2018 through 02/23/2019. -We reviewed results of CT of the chest with contrast from 03/22/2019.  Stable size of the right upper lobe lung mass measuring 4.9 x 3 cm when compared to scan from August.  Stable left adrenal nodule.  Stable small mediastinal nodes. -Pemetrexed maintenance started on 03/24/2019. -Today his creatinine is elevated at 1.43.  GFR is 42 mL/min. -We will give him fluids.  I will hold his pemetrexed. -We will check his labs on Friday.  If GFR improves to 45, he will proceed with pemetrexed.  His insurance apparently approved pembrolizumab.  We will also give pembrolizumab at that time. -Reports watering of the eyes.  I have told him to use artificial tears. -He will come back in 3 weeks for follow-up.  2.  Brain metastasis: -Right parietal lobe 16.6 x 13.5 x 15.5 mm lesion on MRI dated 11/12/2018 with significant surrounding edema and mass-effect. -SRS to the brain lesion on 11/24/2018. -We reviewed results of the brain MRI dated 03/11/2019 which showed right parietal operculum metastatic disease smaller, measuring 4.5 mm.  Marked improvement in vasogenic edema.  No new metastatic disease.  3.  Lower extremity swelling: -He is taking Lasix 20 mg once daily.  4.  Hypomagnesemia: -Magnesium is 1.5.  We will replete magnesium today. -We will increase magnesium to 3 times a day.

## 2019-04-19 ENCOUNTER — Ambulatory Visit (HOSPITAL_COMMUNITY): Payer: Medicare Other

## 2019-05-04 ENCOUNTER — Other Ambulatory Visit: Payer: Self-pay | Admitting: *Deleted

## 2019-05-04 DIAGNOSIS — C7931 Secondary malignant neoplasm of brain: Secondary | ICD-10-CM

## 2019-05-10 ENCOUNTER — Encounter (HOSPITAL_COMMUNITY): Payer: Self-pay

## 2019-05-10 ENCOUNTER — Other Ambulatory Visit: Payer: Self-pay

## 2019-05-10 ENCOUNTER — Encounter (HOSPITAL_COMMUNITY): Payer: Self-pay | Admitting: Hematology

## 2019-05-10 ENCOUNTER — Inpatient Hospital Stay (HOSPITAL_COMMUNITY): Payer: Medicare Other

## 2019-05-10 ENCOUNTER — Inpatient Hospital Stay (HOSPITAL_COMMUNITY): Payer: Medicare Other | Attending: Hematology | Admitting: Hematology

## 2019-05-10 VITALS — BP 141/79 | HR 90 | Temp 97.1°F | Resp 18

## 2019-05-10 VITALS — BP 143/60 | HR 93 | Temp 97.5°F | Resp 20 | Wt 197.6 lb

## 2019-05-10 DIAGNOSIS — Z86711 Personal history of pulmonary embolism: Secondary | ICD-10-CM | POA: Insufficient documentation

## 2019-05-10 DIAGNOSIS — C7931 Secondary malignant neoplasm of brain: Secondary | ICD-10-CM | POA: Insufficient documentation

## 2019-05-10 DIAGNOSIS — C3411 Malignant neoplasm of upper lobe, right bronchus or lung: Secondary | ICD-10-CM

## 2019-05-10 DIAGNOSIS — I10 Essential (primary) hypertension: Secondary | ICD-10-CM | POA: Insufficient documentation

## 2019-05-10 DIAGNOSIS — Z79899 Other long term (current) drug therapy: Secondary | ICD-10-CM | POA: Diagnosis not present

## 2019-05-10 DIAGNOSIS — Z7984 Long term (current) use of oral hypoglycemic drugs: Secondary | ICD-10-CM | POA: Diagnosis not present

## 2019-05-10 DIAGNOSIS — E119 Type 2 diabetes mellitus without complications: Secondary | ICD-10-CM | POA: Insufficient documentation

## 2019-05-10 DIAGNOSIS — Z5111 Encounter for antineoplastic chemotherapy: Secondary | ICD-10-CM | POA: Diagnosis not present

## 2019-05-10 DIAGNOSIS — Z87891 Personal history of nicotine dependence: Secondary | ICD-10-CM | POA: Diagnosis not present

## 2019-05-10 DIAGNOSIS — M7989 Other specified soft tissue disorders: Secondary | ICD-10-CM | POA: Insufficient documentation

## 2019-05-10 LAB — COMPREHENSIVE METABOLIC PANEL
ALT: 24 U/L (ref 0–44)
AST: 26 U/L (ref 15–41)
Albumin: 3.6 g/dL (ref 3.5–5.0)
Alkaline Phosphatase: 70 U/L (ref 38–126)
Anion gap: 13 (ref 5–15)
BUN: 16 mg/dL (ref 8–23)
CO2: 24 mmol/L (ref 22–32)
Calcium: 8.9 mg/dL (ref 8.9–10.3)
Chloride: 105 mmol/L (ref 98–111)
Creatinine, Ser: 1.43 mg/dL — ABNORMAL HIGH (ref 0.61–1.24)
GFR calc Af Amer: 56 mL/min — ABNORMAL LOW (ref >60)
GFR calc non Af Amer: 48 mL/min — ABNORMAL LOW (ref >60)
Glucose, Bld: 156 mg/dL — ABNORMAL HIGH (ref 70–99)
Potassium: 3.5 mmol/L (ref 3.5–5.1)
Sodium: 142 mmol/L (ref 135–145)
Total Bilirubin: 0.8 mg/dL (ref 0.3–1.2)
Total Protein: 7.6 g/dL (ref 6.5–8.1)

## 2019-05-10 LAB — CBC WITH DIFFERENTIAL/PLATELET
Abs Immature Granulocytes: 0.07 10*3/uL (ref 0.00–0.07)
Basophils Absolute: 0.1 10*3/uL (ref 0.0–0.1)
Basophils Relative: 1 %
Eosinophils Absolute: 0.2 10*3/uL (ref 0.0–0.5)
Eosinophils Relative: 1 %
HCT: 32.5 % — ABNORMAL LOW (ref 39.0–52.0)
Hemoglobin: 10.3 g/dL — ABNORMAL LOW (ref 13.0–17.0)
Immature Granulocytes: 1 %
Lymphocytes Relative: 32 %
Lymphs Abs: 4.4 10*3/uL — ABNORMAL HIGH (ref 0.7–4.0)
MCH: 33.6 pg (ref 26.0–34.0)
MCHC: 31.7 g/dL (ref 30.0–36.0)
MCV: 105.9 fL — ABNORMAL HIGH (ref 80.0–100.0)
Monocytes Absolute: 1.5 10*3/uL — ABNORMAL HIGH (ref 0.1–1.0)
Monocytes Relative: 11 %
Neutro Abs: 7.5 10*3/uL (ref 1.7–7.7)
Neutrophils Relative %: 54 %
Platelets: 499 10*3/uL — ABNORMAL HIGH (ref 150–400)
RBC: 3.07 MIL/uL — ABNORMAL LOW (ref 4.22–5.81)
RDW: 18.6 % — ABNORMAL HIGH (ref 11.5–15.5)
WBC: 13.8 10*3/uL — ABNORMAL HIGH (ref 4.0–10.5)
nRBC: 0 % (ref 0.0–0.2)

## 2019-05-10 LAB — MAGNESIUM: Magnesium: 1.9 mg/dL (ref 1.7–2.4)

## 2019-05-10 MED ORDER — SODIUM CHLORIDE 0.9 % IV SOLN: INTRAVENOUS | Status: AC

## 2019-05-10 MED ORDER — GABAPENTIN 300 MG PO CAPS: 300.0000 mg | ORAL_CAPSULE | Freq: Three times a day (TID) | ORAL | 2 refills | Status: DC

## 2019-05-10 MED ORDER — SODIUM CHLORIDE 0.9% FLUSH
10.0000 mL | INTRAVENOUS | Status: DC | PRN
  Administered 2019-05-10: 10 mL

## 2019-05-10 MED ORDER — SODIUM CHLORIDE 0.9 % IV SOLN
2.0000 mg/kg | Freq: Once | INTRAVENOUS | Status: AC
  Administered 2019-05-10: 200 mg via INTRAVENOUS
  Filled 2019-05-10: qty 8

## 2019-05-10 MED ORDER — SODIUM CHLORIDE 0.9 % IV SOLN: Freq: Once | INTRAVENOUS | Status: AC

## 2019-05-10 MED ORDER — HEPARIN SOD (PORK) LOCK FLUSH 100 UNIT/ML IV SOLN
500.0000 [IU] | Freq: Once | INTRAVENOUS | Status: AC | PRN
  Administered 2019-05-10: 500 [IU]

## 2019-05-10 NOTE — Patient Instructions (Signed)
[name] Discharge Instructions for Patients Receiving Chemotherapy   Beginning January 23rd 2017 lab work for the [facility] will be done in the  Main lab at [facility] on 1st floor. If you have a lab appointment with the [facility] please come in thru the  Main Entrance and check in at the main information desk   Today you received the following chemotherapy agents Keytruda. Follow-up as scheduled. Call clinic for any questions or concerns  To help prevent nausea and vomiting after your treatment, we encourage you to take your nausea medication   If you develop nausea and vomiting, or diarrhea that is not controlled by your medication, call the clinic.  The clinic phone number is [phone]. Office hours are Monday-Friday 8:30am-5:00pm.  BELOW ARE SYMPTOMS THAT SHOULD BE REPORTED IMMEDIATELY:  *FEVER GREATER THAN 101.0 F  *CHILLS WITH OR WITHOUT FEVER  NAUSEA AND VOMITING THAT IS NOT CONTROLLED WITH YOUR NAUSEA MEDICATION  *UNUSUAL SHORTNESS OF BREATH  *UNUSUAL BRUISING OR BLEEDING  TENDERNESS IN MOUTH AND THROAT WITH OR WITHOUT PRESENCE OF ULCERS  *URINARY PROBLEMS  *BOWEL PROBLEMS  UNUSUAL RASH Items with * indicate a potential emergency and should be followed up as soon as possible. If you have an emergency after office hours please contact your primary care physician or go to the nearest emergency department.  Please call the clinic during office hours if you have any questions or concerns.   You may also contact the Patient Navigator at [phone] should you have any questions or need assistance in obtaining follow up care.      Resources For Cancer Patients and their Caregivers ? American Cancer Society: Can assist with transportation, wigs, general needs, runs Look Good Feel Better.        813-418-6607 ? Cancer Care: Provides financial assistance, online support groups, medication/co-pay assistance.  1-800-813-HOPE  937-523-4927) ? [place] Assists [place] cancer patients and their families through emotional , educational and financial support.  (334)693-6408 ? [place] DSS Where to apply for food stamps, Medicaid and utility assistance. (470) 323-1793 ? [facility]: Transportation to medical appointments. (412)377-9742 ? Social Security Administration: May apply for disability if have a Stage IV cancer. [phone] 9598527451 ? [employer], Disability and Transit Services: Assists with nutrition, care and transit needs. [phone]

## 2019-05-10 NOTE — Patient Instructions (Addendum)
[place] at [facility] Discharge Instructions  You were seen today by Dr. Delton Coombes. He went over your recent lab results. You will get your Keytruda today and not the chemo due top your kidney numbers. He will see you back in 3 weeks for labs, treatment and follow up.  He will send in a new prescription for Gabapentin to help with the "pins and needles" feeling in your feet and legs. You will need to take this 3 times a day.  Thank you for choosing [place] [facility] to provide your oncology and hematology care.  To afford each patient quality time with our provider, please arrive at least 15 minutes before your scheduled appointment time.   If you have a lab appointment with the [facility] please come in thru the  Main Entrance and check in at the main information desk  You need to re-schedule your appointment should you arrive 10 or more minutes late.  We strive to give you quality time with our providers, and arriving late affects you and other patients whose appointments are after yours.  Also, if you no show three or more times for appointments you may be dismissed from the clinic at the providers discretion.     Again, thank you for choosing [facility].  Our hope is that these requests will decrease the amount of time that you wait before being seen by our physicians.       _____________________________________________________________  Should you have questions after your visit to [facility], please contact our office at [phone] between the hours of 8:00 a.m. and 4:30 p.m.  Voicemails left after 4:00 p.m. will not be returned until the following business day.  For prescription refill requests, have your pharmacy contact our office and allow 72 hours.    Cancer Center Support Programs:   > Cancer Support Group  2nd Tuesday of the month 1pm-2pm, Journey Room

## 2019-05-10 NOTE — Progress Notes (Signed)
1110 Labs reviewed with and pt seen by Dr. Delton Coombes and pt approved for Keytruda infusion only today with 500 ml NS added over 1 hr per MD                                                                              Darrell Christensen tolerated Keytruda infusion and hydration well without complaints or incident. VSS upon discharge. Pt discharged self ambulatory in satisfactory condition

## 2019-05-10 NOTE — Progress Notes (Signed)
[place], [place] [address]   CLINIC:  Medical Oncology/Hematology  PCP:  Celene Squibb, MD [place] [address] [phone]   REASON FOR VISIT:  Follow-up for stage IV adenocarcinoma of the right lung   BRIEF ONCOLOGIC HISTORY:  Oncology History  Primary cancer of right upper lobe of lung ([facility])  05/14/2015 Procedure   Port placement by Dr. Arnoldo Morale   04/01/2016 Initial Diagnosis   Primary cancer of right upper lobe of lung ([facility])   04/02/2016 Procedure   Ultrasound-guided core biopsy performed of a solid 1.5 cm soft tissue nodule in the right chest wall.   04/03/2016 Imaging   MRI brain- No acute and intracranial process or metastasis.  Moderate chronic small vessel ischemic disease.   04/04/2016 Pathology Results   Diagnosis Soft Tissue Needle Core Biopsy, Right Chest Wall SMOOTH MUSCLE NEOPLASM Microscopic Comment The differential diagnosis include low grade leiomyosarcoma. The neoplasm shows spindle cell morphology with rare mitotic figures and minimal atypia, no necrosis present. These cells stains positive for smooth muscle actin, desmin, negative for cytokeratin AE1&3, ck8/18, s100, cd117, cd 34 and cd99. This case also reviewed by Dr. [name] and agree.   04/05/2016 Procedure   CT-guided biopsy of right upper lobe lesion, with tissue specimen sent to pathology, by IR.   04/09/2016 Pathology Results   Lung, needle/core biopsy(ies), Right Upper Lobe - NON SMALL CELL CARCINOMA.   04/17/2016 Pathology Results   PDL1 High Expression.  TPS 50%.   04/24/2016 PET scan   1. Prominently hypermetabolic large right upper lobe mass with metastatic disease to the right paratracheal and right hilar nodal chains, to the descending mesocolon, to the left upper buttock subcutaneous tissues, to the left upper lobe, to the right subscapularis muscle, and possibly to the left adrenal gland and right breast  subcutaneous tissues. Appearance compatible with stage IV lung cancer. 2. There is some focal high activity in the ascending colon which is probably from peristalsis, less likely from a local colon mass. This does raise the possibility that the adjacent mesocolon tumor implant could be due to synchronous colon cancer rather than lung metastasis. 3. Other imaging findings of potential clinical significance: Coronary, aortic arch, and branch vessel atherosclerotic vascular disease. Aortoiliac atherosclerotic vascular disease. Deformity left proximal humerus and left glenohumeral joint from prior trauma. Enlarged prostate gland.   04/26/2016 Pathology Results   FoundationONE: Genomic alterations identified- KRAS E09Q, CREBBP splice site [id number], LRP1B S515*, UQJ33 L45*, GYB63 splice site [id number], SPTA1 splice site [id number]>[id number], TP53 C135Y.  Additional findings- MS-Stable, TMB-intermediate (14 Muts/Mb).  No reportable alterations- EGFR, ALK, BRAF, MET, ERBB2, RET, ROS1.   05/17/2016 -  Chemotherapy   The patient had ONDANSETRON IVPB [facility] +/- DEXAMETHASONE, , Intravenous,  Once, 0 of 1 cycle  pembrolizumab (KEYTRUDA) 200 mg in sodium chloride 0.9 % 50 mL chemo infusion, 200 mg, Intravenous, Once, 0 of 5 cycles  for chemotherapy treatment.     08/06/2016 PET scan   IMPRESSION: 1. Overall considerable improvement. The right upper lobe mass is markedly reduced in volume and SUV. Thoracic adenopathy have resolved and the hypermetabolic lesion in the right subscapularis muscle has also resolved. 2. The left upper lobe nodule is no longer appreciably hypermetabolic, and is highly indistinct although this may be due to motion artifact. Faint in indistinct nodularity elsewhere in the lungs likewise not currently hypermetabolic. 3. There is some very faint residual low-grade activity along the subcutaneous deposit  along the upper left buttock region. Marked reduction in size and activity of  the tumor deposit adjacent to the descending colon. 4. Prior right pleural effusion has resolved. 5. Stable appearance of the adrenal glands, with low-level activity and a nodule in the left adrenal gland. 6. Stable small nodule in the right breast, low-grade metabolic activity with maximum SUV 2.2 (formerly 3.0) 7. Other imaging findings of potential clinical significance: Severe arthropathy of the left glenohumeral joint. Coronary, aortic arch, and branch vessel atherosclerotic vascular disease. Aortoiliac atherosclerotic vascular disease. Prominent prostate gland indents the bladder base.    12/16/2018 -  Chemotherapy   The patient had palonosetron (ALOXI) injection 0.25 mg, 0.25 mg, Intravenous,  Once, 4 of 4 cycles Administration: 0.25 mg (12/16/2018), 0.25 mg (01/27/2019), 0.25 mg (03/03/2019), 0.25 mg (01/06/2019) pegfilgrastim (NEULASTA) injection 6 mg, 6 mg, Subcutaneous, Once, 2 of 2 cycles Administration: 6 mg (01/08/2019) pegfilgrastim (NEULASTA ONPRO KIT) injection 6 mg, 6 mg, Subcutaneous, Once, 2 of 2 cycles pegfilgrastim-cbqv (UDENYCA) injection 6 mg, 6 mg, Subcutaneous, Once, 2 of 2 cycles Administration: 6 mg (01/29/2019), 6 mg (03/05/2019) ondansetron (ZOFRAN) 8 mg in sodium chloride 0.9 % 50 mL IVPB, 8 mg (100 % of original dose 8 mg), Intravenous,  Once, 2 of 5 cycles Dose modification: 8 mg (original dose 8 mg, Cycle 5) Administration: 8 mg (03/24/2019), 8 mg (04/16/2019) PEMEtrexed (ALIMTA) 1,100 mg in sodium chloride 0.9 % 100 mL chemo infusion, 490 mg/m2 = 1,125 mg, Intravenous,  Once, 6 of 6 cycles Dose modification: 400 mg/m2 (80 % of original dose 500 mg/m2, Cycle 3, Reason: Other (see comments), Comment: rash, swelling) Administration: 1,100 mg (12/16/2018), 900 mg (01/27/2019), 900 mg (03/03/2019), 900 mg (03/24/2019), 900 mg (04/16/2019), 1,100 mg (01/06/2019) CARBOplatin (PARAPLATIN) 500 mg in sodium chloride 0.9 % 250 mL chemo infusion, 530 mg (110.7 % of original  dose 477.5 mg), Intravenous,  Once, 4 of 4 cycles Dose modification:   (original dose 477.5 mg, Cycle 1),   (original dose 485.5 mg, Cycle 3),   (original dose 494 mg, Cycle 4),   (original dose 494 mg, Cycle 2) Administration: 500 mg (12/16/2018), 490 mg (01/27/2019), 490 mg (03/03/2019), 490 mg (01/06/2019) pembrolizumab (KEYTRUDA) 200 mg in sodium chloride 0.9 % 50 mL chemo infusion, 2 mg/kg = 200 mg, Intravenous, Once, 2 of 5 cycles Administration: 200 mg (04/16/2019) fosaprepitant (EMEND) 150 mg, dexamethasone (DECADRON) 12 mg in sodium chloride 0.9 % 145 mL IVPB, , Intravenous,  Once, 4 of 4 cycles Administration:  (12/16/2018),  (01/27/2019),  (03/03/2019),  (01/06/2019)  for chemotherapy treatment.    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  Two adjacent large masslike areas of consolidation within the right upper lobe with differential considerations including malignancy or pneumonia. Multiple enlarged right hilar and mediastinal lymph nodes which may be reactive or metastatic in etiology.  Additional indeterminate pulmonary nodules as above. Recommend attention on follow-up.  Indeterminate 2.9 cm left adrenal nodule. This needs dedicated evaluation with pre and post contrast-enhanced CT or MRI after confirmation of pulmonary process.  Indeterminate nodule within the right breast. Recommend dedicated evaluation with mammography.  Hepatic steatosis.   04/02/2016 Procedure   Ultrasound-guided core biopsy performed of a solid 1.5 cm soft tissue nodule in the right chest wall.   04/04/2016 Pathology Results   Diagnosis Soft Tissue Needle Core Biopsy, Right Chest Wall SMOOTH MUSCLE NEOPLASM Microscopic  Comment The differential diagnosis include low grade leiomyosarcoma. The neoplasm shows spindle cell morphology with rare mitotic figures and  minimal atypia, no necrosis present. These cells stains positive for smooth muscle actin, desmin, negative for cytokeratin AE1&3, ck8/18, s100, cd117, cd 34 and cd99. This case also reviewed by Dr. [name] and agree.       CANCER STAGING: Cancer Staging Primary cancer of right upper lobe of lung ([facility]) Staging form: Lung, AJCC 8th Edition - Clinical: Stage IVB (cT3, cN2, cM1c) - Signed by Baird Cancer, PA-C on 05/07/2016    INTERVAL HISTORY:  Darrell Christensen 74 y.o. male seen prior to his next cycle of chemotherapy for toxicity assessment.  He is accompanied by his daughter.  Reported pins-and-needles sensation in the feet and legs which is new in the last 3 to 4 weeks.  This is keeping him awake at night.  He is continuing to take magnesium 3 times a day.  He is also taking Lasix 20 mg daily.  He avoids Lasix on the days he travels outside the home.  Reports taking folic acid daily.  REVIEW OF SYSTEMS:  Review of Systems  Eyes: Positive for eye problems.  Respiratory: Positive for cough and shortness of breath.   Neurological: Positive for dizziness and numbness.  All other systems reviewed and are negative.    PAST MEDICAL/SURGICAL HISTORY:  Past Medical History:  Diagnosis Date  . Adrenal mass, left ([facility]) 04/01/2016  . Diabetes mellitus ([facility]) 05/02/2016  . Diabetes mellitus without complication ([facility])   . Hypertension   . Mass of breast, right 04/01/2016  . Mass of upper lobe of right lung 04/01/2016   2 adjacent, 5 cm masses, hilar adenopathy  . Primary cancer of right upper lobe of lung ([facility]) 04/01/2016   2 adjacent, 5 cm masses, hilar adenopathy  . Pulmonary embolus ([facility]) 03/31/2016  . 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 COLONOSCOPY N/A 07/28/2017   Procedure: COLONOSCOPY;  Surgeon: Rogene Houston, MD;  Location: [facility] ENDO SUITE;  Service: Endoscopy;  Laterality: N/A;  205  . PORTACATH PLACEMENT Left 05/13/2016   Procedure:  INSERTION PORT-A-CATH;  Surgeon: Aviva Signs, MD;  Location: [facility] ORS;  Service: General;  Laterality: Left;     SOCIAL HISTORY:  Social History   Socioeconomic History  . Marital status: Divorced    Spouse name: Not on file  . Number of children: Not on file  . Years of education: Not on file  . Highest education level: Not on file  Occupational History  . Not on file  Tobacco Use  . Smoking status: Former Smoker    Years: 61.00    Quit date: 04/11/2014    Years since quitting: 5.0  . Smokeless tobacco: Never Used  . Tobacco comment: patient does vape smoking x 2 years  Substance and Sexual Activity  . Alcohol use: Not Currently    Comment: quit 10 years  . Drug use: No  . Sexual activity: Not on file  Other Topics Concern  . Not on file  Social History Narrative  . Not on file   Social Determinants of Health   Financial Resource Strain:   . Difficulty of Paying Living Expenses: Not on file  Food Insecurity:   . Worried About Charity fundraiser in the Last Year: Not on file  . Ran Out of Food in the Last Year: Not on file  Transportation Needs: No Transportation Needs  .  Lack of Transportation (Medical): No  . Lack of Transportation (Non-Medical): No  Physical Activity:   . Days of Exercise per Week: Not on file  . Minutes of Exercise per Session: Not on file  Stress:   . Feeling of Stress : Not on file  Social Connections:   . Frequency of Communication with Friends and Family: Not on file  . Frequency of Social Gatherings with Friends and Family: Not on file  . Attends Religious Services: Not on file  . Active Member of Clubs or Organizations: Not on file  . Attends Archivist Meetings: Not on file  . Marital Status: Not on file  Intimate Partner Violence:   . Fear of Current or Ex-Partner: Not on file  . Emotionally Abused: Not on file  . Physically Abused: Not on file  . Sexually Abused: Not on file    FAMILY HISTORY:  Family History   Problem Relation Age of Onset  . Stroke Mother   . Heart attack Father   . Heart attack Brother     CURRENT MEDICATIONS:  Outpatient Encounter Medications as of 05/10/2019  Medication Sig  . ALPRAZolam (XANAX) 1 MG tablet Take 1 mg by mouth 3 (three) times daily as needed for anxiety.  Marland Kitchen amLODipine (NORVASC) 2.5 MG tablet Take 2.5 mg by mouth daily.  Marland Kitchen CARBOPLATIN IV Inject into the vein every 21 ( twenty-one) days.  . folic acid (FOLVITE) 1 MG tablet Take 1 tablet (1 mg total) by mouth daily. Start 5-7 days before Alimta chemotherapy. Continue until 21 days after Alimta completed.  Marland Kitchen GLIPIZIDE XL 5 MG 24 hr tablet Take 5 mg by mouth daily.  . magnesium oxide (MAG-OX) 400 (241.3 Mg) MG tablet Take 1 tablet (400 mg total) by mouth 2 (two) times daily.  . metFORMIN (GLUCOPHAGE) 500 MG tablet Take 500 mg by mouth 2 (two) times daily.  . metoprolol succinate (TOPROL-XL) 25 MG 24 hr tablet TAKE ONE TABLET BY MOUTH IN THE EVENING.  . Pembrolizumab (KEYTRUDA IV) Inject into the vein.  Marland Kitchen PEMEtrexed 500 mg/m2 in sodium chloride 0.9 % 100 mL Inject into the vein every 21 ( twenty-one) days.  . simvastatin (ZOCOR) 20 MG tablet Take 20 mg by mouth every evening.  . torsemide (DEMADEX) 20 MG tablet Take 1 tablet (20 mg total) by mouth daily.  . furosemide (LASIX) 20 MG tablet Take 2 tablets (40 mg total) by mouth daily. (Patient not taking: Reported on 04/14/2019)  . gabapentin (NEURONTIN) 300 MG capsule Take 1 capsule (300 mg total) by mouth 3 (three) times daily.  . hydrocortisone 2.5 % lotion Apply topically as needed.   . lidocaine-prilocaine (EMLA) cream APPLY A QUARTER SIZE AMOUNT TO THE AFFECTED AREA 1 HOUR PRIOR TO COMING TO CHEMOTHERAPY. COVER WITH A PLASTIC WRAP. (Patient not taking: Reported on 03/24/2019)  . ondansetron (ZOFRAN) 4 MG tablet Take 1 tablet (4 mg total) by mouth every 8 (eight) hours as needed for nausea or vomiting. (Patient not taking: Reported on 02/17/2019)  .  oxyCODONE-acetaminophen (PERCOCET) 10-325 MG tablet Take 1 tablet by mouth as needed.   . potassium chloride (K-DUR,KLOR-CON) 10 MEQ tablet Take 10 mEq by mouth daily.   . prochlorperazine (COMPAZINE) 10 MG tablet Take 1 tablet (10 mg total) by mouth every 6 (six) hours as needed (Nausea or vomiting). (Patient not taking: Reported on 02/04/2019)   No facility-administered encounter medications on file as of 05/10/2019.    ALLERGIES:  No Known Allergies  PHYSICAL EXAM:  ECOG Performance status: 1  Vitals:   05/10/19 1008  BP: (!) 143/60  Pulse: 93  Resp: 20  Temp: (!) 97.5 F (36.4 C)  SpO2: 99%   Filed Weights   05/10/19 1008  Weight: 197 lb 9.6 oz (89.6 kg)    Physical Exam Vitals reviewed.  Constitutional:      Appearance: Normal appearance.  Cardiovascular:     Rate and Rhythm: Normal rate and regular rhythm.     Heart sounds: Normal heart sounds.  Pulmonary:     Effort: Pulmonary effort is normal.     Breath sounds: Normal breath sounds.  Abdominal:     General: There is no distension.     Palpations: Abdomen is soft. There is no mass.  Musculoskeletal:        General: No swelling.  Skin:    General: Skin is warm.  Neurological:     General: No focal deficit present.     Mental Status: He is alert and oriented to person, place, and time.  Psychiatric:        Mood and Affect: Mood normal.        Behavior: Behavior normal.      LABORATORY DATA:  I have reviewed the labs as listed.  CBC    Component Value Date/Time   WBC 13.8 (H) 05/10/2019 0855   RBC 3.07 (L) 05/10/2019 0855   HGB 10.3 (L) 05/10/2019 0855   HGB 13.5 05/02/2016 0852   HCT 32.5 (L) 05/10/2019 0855   HCT 41.0 05/02/2016 0852   PLT 499 (H) 05/10/2019 0855   PLT 436 (H) 05/02/2016 0852   MCV 105.9 (H) 05/10/2019 0855   MCV 85.7 05/02/2016 0852   MCH 33.6 05/10/2019 0855   MCHC 31.7 05/10/2019 0855   RDW 18.6 (H) 05/10/2019 0855   RDW 13.9 05/02/2016 0852   LYMPHSABS 4.4 (H)  05/10/2019 0855   LYMPHSABS 3.4 (H) 05/02/2016 0852   MONOABS 1.5 (H) 05/10/2019 0855   MONOABS 1.2 (H) 05/02/2016 0852   EOSABS 0.2 05/10/2019 0855   EOSABS 1.6 (H) 05/02/2016 0852   BASOSABS 0.1 05/10/2019 0855   BASOSABS 0.2 (H) 05/02/2016 0852   CMP Latest Ref Rng & Units 05/10/2019 04/16/2019 04/14/2019  Glucose 70 - 99 mg/dL 156(H) 113(H) 99  BUN 8 - 23 mg/dL _0 Creatinine 0.61 - 1.24 mg/dL 1.43(H) 1.34(H) 1.43(H)  Sodium 135 - 145 mmol/L 142 143 144  Potassium 3.5 - 5.1 mmol/L 3.5 3.6 3.8  Chloride 98 - 111 mmol/L 105 105 106  CO2 22 - 32 mmol/L _1 Calcium 8.9 - 10.3 mg/dL 8.9 8.9 8.9  Total Protein 6.5 - 8.1 g/dL 7.6 - 7.2  Total Bilirubin 0.3 - 1.2 mg/dL 0.8 - 0.3  Alkaline Phos 38 - 126 U/L 70 - 73  AST 15 - 41 U/L 26 - 26  ALT 0 - 44 U/L 24 - 31       DIAGNOSTIC IMAGING:  I have independently reviewed the scans and discussed with the patient.     ASSESSMENT & PLAN:   Primary cancer of right upper lobe of lung ([employer]) 1.  Stage IV adenocarcinoma of the lung, PDL 1-50%, other actionable mutations negative: - Keytruda from 05/17/2016 through 09/24/2018 with mild progression. -CT of the chest on 12/17/2018 shows posterior right upper lobe mass measured 4.1 x 3.2 cm, previously 5.2 x 3.9 cm.  Right paratracheal lymph node decreased from 12 mm to 9 mm.  No new areas were seen.  Right breast nodule also decreased in size. -4 cycles of carboplatin and pemetrexed from 12/25/2018 through 02/23/2019. -CT of the chest with contrast from 03/22/2019 shows stable size of the right upper lobe lung mass measuring 4.9 x 3 cm when compared to scan from August.  Stable left adrenal nodule.  Stable small mediastinal nodes. -Pemetrexed maintenance started on 03/24/2019. -On 04/16/2019, he also received pembrolizumab with pemetrexed. -Denies any immunotherapy related side effects including skin rashes, diarrhea or new onset cough. -We reviewed his labs.  His creatinine increased  to 1.43 today.  Hence I will hold off on pemetrexed. -We will proceed with pembrolizumab today.  He will receive 100 mL of normal saline. -I plan to repeat scan in mid February of the chest.  We will see him back in 3 weeks for follow-up.  He is reportedly scheduled for MRI of the brain on 06/17/2019.  2.  Brain metastasis: -Right parietal lobe 16.6 x 13.5 x 15.5 mm lesion on MRI dated 11/12/2018 with significant surrounding edema and mass-effect. -SRS to the brain lesion on 11/24/2018. -We reviewed results of the brain MRI dated 03/11/2019 which showed right parietal operculum metastatic disease smaller, measuring 4.5 mm.  Marked improvement in vasogenic edema.  No new metastatic disease.  3.  Leg swelling: -He will continue Lasix 20 mg once daily.  His leg swelling is improved.  There is mild hyperpigmentation of the right leg which is stable.  4.  Hypomagnesemia: -Magnesium today is 1.9. -he will continue magnesium 3 times a day..  5.  Peripheral neuropathy: -He reported pins-and-needles sensation in the legs for the last 3 to 4 weeks.  This is likely from underlying diabetes. -We will start him on gabapentin 300 mg 3 times a day.  We talked about side effects in detail.      Orders placed this encounter:  Orders Placed This Encounter  Procedures  . CBC with Differential/Platelet  . Comprehensive metabolic panel  . Magnesium      Derek Jack, MD [place] [phone]

## 2019-05-10 NOTE — Assessment & Plan Note (Signed)
1.  Stage IV adenocarcinoma of the lung, PDL 1-50%, other actionable mutations negative: - Keytruda from 05/17/2016 through 09/24/2018 with mild progression. -CT of the chest on 12/17/2018 shows posterior right upper lobe mass measured 4.1 x 3.2 cm, previously 5.2 x 3.9 cm.  Right paratracheal lymph node decreased from 12 mm to 9 mm.  No new areas were seen.  Right breast nodule also decreased in size. -4 cycles of carboplatin and pemetrexed from 12/25/2018 through 02/23/2019. -CT of the chest with contrast from 03/22/2019 shows stable size of the right upper lobe lung mass measuring 4.9 x 3 cm when compared to scan from August.  Stable left adrenal nodule.  Stable small mediastinal nodes. -Pemetrexed maintenance started on 03/24/2019. -On 04/16/2019, he also received pembrolizumab with pemetrexed. -Denies any immunotherapy related side effects including skin rashes, diarrhea or new onset cough. -We reviewed his labs.  His creatinine increased to 1.43 today.  Hence I will hold off on pemetrexed. -We will proceed with pembrolizumab today.  He will receive 100 mL of normal saline. -I plan to repeat scan in mid February of the chest.  We will see him back in 3 weeks for follow-up.  He is reportedly scheduled for MRI of the brain on 06/17/2019.  2.  Brain metastasis: -Right parietal lobe 16.6 x 13.5 x 15.5 mm lesion on MRI dated 11/12/2018 with significant surrounding edema and mass-effect. -SRS to the brain lesion on 11/24/2018. -We reviewed results of the brain MRI dated 03/11/2019 which showed right parietal operculum metastatic disease smaller, measuring 4.5 mm.  Marked improvement in vasogenic edema.  No new metastatic disease.  3.  Leg swelling: -He will continue Lasix 20 mg once daily.  His leg swelling is improved.  There is mild hyperpigmentation of the right leg which is stable.  4.  Hypomagnesemia: -Magnesium today is 1.9. -he will continue magnesium 3 times a day..  5.  Peripheral  neuropathy: -He reported pins-and-needles sensation in the legs for the last 3 to 4 weeks.  This is likely from underlying diabetes. -We will start him on gabapentin 300 mg 3 times a day.  We talked about side effects in detail.

## 2019-05-12 ENCOUNTER — Ambulatory Visit (HOSPITAL_COMMUNITY): Payer: Medicare Other

## 2019-05-14 ENCOUNTER — Ambulatory Visit (HOSPITAL_COMMUNITY): Payer: Medicare Other

## 2019-05-14 NOTE — Progress Notes (Signed)
Nutrition  Called patient for nutrition follow-up.  No answer.  Left message on voicemail with call back number.   Dashawn Golda B. Zenia Resides, [place], [place] Registered Dietitian [phone] (pager)

## 2019-05-19 ENCOUNTER — Other Ambulatory Visit: Payer: Self-pay | Admitting: Radiation Therapy

## 2019-05-31 ENCOUNTER — Inpatient Hospital Stay (HOSPITAL_COMMUNITY): Payer: Medicare Other

## 2019-05-31 ENCOUNTER — Inpatient Hospital Stay (HOSPITAL_COMMUNITY): Payer: Medicare Other | Admitting: Hematology

## 2019-05-31 ENCOUNTER — Other Ambulatory Visit: Payer: Self-pay

## 2019-05-31 ENCOUNTER — Encounter (HOSPITAL_COMMUNITY): Payer: Self-pay | Admitting: Hematology

## 2019-05-31 VITALS — BP 114/60 | HR 81 | Temp 97.5°F | Resp 19

## 2019-05-31 VITALS — BP 145/67 | HR 93 | Temp 97.8°F | Resp 20 | Wt 205.0 lb

## 2019-05-31 DIAGNOSIS — Z5111 Encounter for antineoplastic chemotherapy: Secondary | ICD-10-CM | POA: Diagnosis not present

## 2019-05-31 DIAGNOSIS — Z86711 Personal history of pulmonary embolism: Secondary | ICD-10-CM | POA: Diagnosis not present

## 2019-05-31 DIAGNOSIS — C3411 Malignant neoplasm of upper lobe, right bronchus or lung: Secondary | ICD-10-CM

## 2019-05-31 DIAGNOSIS — I10 Essential (primary) hypertension: Secondary | ICD-10-CM | POA: Diagnosis not present

## 2019-05-31 DIAGNOSIS — Z7984 Long term (current) use of oral hypoglycemic drugs: Secondary | ICD-10-CM | POA: Diagnosis not present

## 2019-05-31 DIAGNOSIS — C7931 Secondary malignant neoplasm of brain: Secondary | ICD-10-CM | POA: Diagnosis not present

## 2019-05-31 DIAGNOSIS — Z79899 Other long term (current) drug therapy: Secondary | ICD-10-CM | POA: Diagnosis not present

## 2019-05-31 DIAGNOSIS — E119 Type 2 diabetes mellitus without complications: Secondary | ICD-10-CM | POA: Diagnosis not present

## 2019-05-31 DIAGNOSIS — Z87891 Personal history of nicotine dependence: Secondary | ICD-10-CM | POA: Diagnosis not present

## 2019-05-31 DIAGNOSIS — M7989 Other specified soft tissue disorders: Secondary | ICD-10-CM | POA: Diagnosis not present

## 2019-05-31 LAB — CBC WITH DIFFERENTIAL/PLATELET
Abs Immature Granulocytes: 0.05 10*3/uL (ref 0.00–0.07)
Basophils Absolute: 0.1 10*3/uL (ref 0.0–0.1)
Basophils Relative: 1 %
Eosinophils Absolute: 0.5 10*3/uL (ref 0.0–0.5)
Eosinophils Relative: 4 %
HCT: 33.4 % — ABNORMAL LOW (ref 39.0–52.0)
Hemoglobin: 10.4 g/dL — ABNORMAL LOW (ref 13.0–17.0)
Immature Granulocytes: 0 %
Lymphocytes Relative: 36 %
Lymphs Abs: 4.7 10*3/uL — ABNORMAL HIGH (ref 0.7–4.0)
MCH: 32.1 pg (ref 26.0–34.0)
MCHC: 31.1 g/dL (ref 30.0–36.0)
MCV: 103.1 fL — ABNORMAL HIGH (ref 80.0–100.0)
Monocytes Absolute: 1 10*3/uL (ref 0.1–1.0)
Monocytes Relative: 8 %
Neutro Abs: 6.6 10*3/uL (ref 1.7–7.7)
Neutrophils Relative %: 51 %
Platelets: 277 10*3/uL (ref 150–400)
RBC: 3.24 MIL/uL — ABNORMAL LOW (ref 4.22–5.81)
RDW: 16 % — ABNORMAL HIGH (ref 11.5–15.5)
WBC: 13 10*3/uL — ABNORMAL HIGH (ref 4.0–10.5)
nRBC: 0 % (ref 0.0–0.2)

## 2019-05-31 LAB — COMPREHENSIVE METABOLIC PANEL
ALT: 21 U/L (ref 0–44)
AST: 27 U/L (ref 15–41)
Albumin: 3.6 g/dL (ref 3.5–5.0)
Alkaline Phosphatase: 86 U/L (ref 38–126)
Anion gap: 11 (ref 5–15)
BUN: 17 mg/dL (ref 8–23)
CO2: 27 mmol/L (ref 22–32)
Calcium: 9 mg/dL (ref 8.9–10.3)
Chloride: 105 mmol/L (ref 98–111)
Creatinine, Ser: 1.41 mg/dL — ABNORMAL HIGH (ref 0.61–1.24)
GFR calc Af Amer: 56 mL/min — ABNORMAL LOW (ref >60)
GFR calc non Af Amer: 49 mL/min — ABNORMAL LOW (ref >60)
Glucose, Bld: 146 mg/dL — ABNORMAL HIGH (ref 70–99)
Potassium: 4 mmol/L (ref 3.5–5.1)
Sodium: 143 mmol/L (ref 135–145)
Total Bilirubin: 0.4 mg/dL (ref 0.3–1.2)
Total Protein: 7.1 g/dL (ref 6.5–8.1)

## 2019-05-31 LAB — MAGNESIUM: Magnesium: 1.9 mg/dL (ref 1.7–2.4)

## 2019-05-31 MED ORDER — SODIUM CHLORIDE 0.9 % IV SOLN: 8.0000 mg | Freq: Once | INTRAVENOUS | Status: DC

## 2019-05-31 MED ORDER — SODIUM CHLORIDE 0.9 % IV SOLN
2.0000 mg/kg | Freq: Once | INTRAVENOUS | Status: AC
  Administered 2019-05-31: 200 mg via INTRAVENOUS
  Filled 2019-05-31: qty 8

## 2019-05-31 MED ORDER — HEPARIN SOD (PORK) LOCK FLUSH 100 UNIT/ML IV SOLN
500.0000 [IU] | Freq: Once | INTRAVENOUS | Status: AC | PRN
  Administered 2019-05-31: 500 [IU]

## 2019-05-31 MED ORDER — CYANOCOBALAMIN 1000 MCG/ML IJ SOLN
1000.0000 ug | Freq: Once | INTRAMUSCULAR | Status: AC
  Administered 2019-05-31: 1000 ug via INTRAMUSCULAR
  Filled 2019-05-31: qty 1

## 2019-05-31 MED ORDER — SODIUM CHLORIDE 0.9 % IV SOLN: Freq: Once | INTRAVENOUS | Status: AC

## 2019-05-31 MED ORDER — SODIUM CHLORIDE 0.9% FLUSH
10.0000 mL | INTRAVENOUS | Status: DC | PRN
  Administered 2019-05-31: 10 mL

## 2019-05-31 NOTE — Patient Instructions (Addendum)
[place] at [facility] Discharge Instructions  You were seen today by Dr. Delton Coombes. He went over your recent lab results. He will schedule you for a repeat CT scan prior to your next visit. He will see you back in 3 weeks for labs, treatment and follow up.   Thank you for choosing [place] at [facility] to provide your oncology and hematology care.  To afford each patient quality time with our provider, please arrive at least 15 minutes before your scheduled appointment time.   If you have a lab appointment with the [facility] please come in thru the  Main Entrance and check in at the main information desk  You need to re-schedule your appointment should you arrive 10 or more minutes late.  We strive to give you quality time with our providers, and arriving late affects you and other patients whose appointments are after yours.  Also, if you no show three or more times for appointments you may be dismissed from the clinic at the providers discretion.     Again, thank you for choosing [facility].  Our hope is that these requests will decrease the amount of time that you wait before being seen by our physicians.       _____________________________________________________________  Should you have questions after your visit to [facility], please contact our office at [phone] between the hours of 8:00 a.m. and 4:30 p.m.  Voicemails left after 4:00 p.m. will not be returned until the following business day.  For prescription refill requests, have your pharmacy contact our office and allow 72 hours.    Cancer Center Support Programs:   > Cancer Support Group  2nd Tuesday of the month 1pm-2pm, Journey Room

## 2019-05-31 NOTE — Progress Notes (Signed)
Creatinine today 1.41. Patient presents for treatment and follow up visit with Dr. Delton Coombes.   Message received from [facility] LPN/ Dr. Delton Coombes. Administer Keytruda today only. May give b12 injection. Continue folic acid.   Treatment given today per MD orders. Tolerated infusion without adverse affects. Vital signs stable. No complaints at this time. Discharged from clinic ambulatory. F/U with [facility] as scheduled.

## 2019-05-31 NOTE — Progress Notes (Signed)
Patient has been assessed, vital signs and labs have been reviewed by Dr. Delton Coombes. ANC, Creatinine, LFTs, and Platelets are within treatment parameters, he will get only Keytruda today per Dr. Delton Coombes. The patient is good to proceed with treatment at this time.

## 2019-05-31 NOTE — Assessment & Plan Note (Addendum)
1.  Stage IV adenocarcinoma of the lung, PDL 1-50%, other actionable mutations negative: - Keytruda from 05/17/2016 through 09/24/2018 with mild progression. -CT of the chest on 12/17/2018 shows posterior right upper lobe mass measured 4.1 x 3.2 cm, previously 5.2 x 3.9 cm.  Right paratracheal lymph node decreased from 12 mm to 9 mm.  No new areas were seen.  Right breast nodule also decreased in size. -4 cycles of carboplatin and pemetrexed from 12/25/2018 through 02/23/2019. -CT of the chest with contrast from 03/22/2019 shows stable size of the right upper lobe lung mass measuring 4.9 x 3 cm when compared to scan from August.  Stable left adrenal nodule.  Stable small mediastinal nodes. -Pemetrexed maintenance started on 03/24/2019.  On 04/16/2019, he was also started on Keytruda. -At last visit, because of his decreased GFR, we have only given him Keytruda. -We reviewed his labs today.  His creatinine is 1.4.  This precludes him from getting pemetrexed. -I will see him back in 3 weeks for follow-up.  I plan to repeat CT scan of the chest with contrast.  He has another brain MRI also scheduled.  2.  Brain metastasis: -SRS to the right parietal lobe 16.6 x 13.5 x 15.5 mm lesion with surrounding edema on 11/24/2018. -MRI of the brain on 03/11/2019 showed right parietal operculum metastatic disease smaller, measuring 4.5 mm.  Marked improvement in vasogenic edema. -He is scheduled for another MRI next week.   3.  Leg swelling: -He is taking Lasix 20 mg once daily.  Leg swellings have improved.  4.  Hypomagnesemia: -Magnesium is 1.9 today.  He will continue magnesium 3 times a day..  5.  Peripheral neuropathy: -I started him on gabapentin 300 mg 3 times a day which she is tolerating well. -Pins-and-needles sensation has improved to numbness at this time.

## 2019-05-31 NOTE — Patient Instructions (Signed)
[facility] Discharge Instructions for Patients Receiving Chemotherapy  Today you received the following chemotherapy agents   To help prevent nausea and vomiting after your treatment, we encourage you to take your nausea medication   If you develop nausea and vomiting that is not controlled by your nausea medication, call the clinic.   BELOW ARE SYMPTOMS THAT SHOULD BE REPORTED IMMEDIATELY:  *FEVER GREATER THAN 100.5 F  *CHILLS WITH OR WITHOUT FEVER  NAUSEA AND VOMITING THAT IS NOT CONTROLLED WITH YOUR NAUSEA MEDICATION  *UNUSUAL SHORTNESS OF BREATH  *UNUSUAL BRUISING OR BLEEDING  TENDERNESS IN MOUTH AND THROAT WITH OR WITHOUT PRESENCE OF ULCERS  *URINARY PROBLEMS  *BOWEL PROBLEMS  UNUSUAL RASH Items with * indicate a potential emergency and should be followed up as soon as possible.  Feel free to call the clinic should you have any questions or concerns. The clinic phone number is [phone].  Please show the CHEMO ALERT CARD at check-in to the Emergency Department and triage nurse.   

## 2019-05-31 NOTE — Progress Notes (Signed)
Darrell Christensen, [place] [address]   CLINIC:  Medical Oncology/Hematology  PCP:  Celene Squibb, MD [place] [address] [phone]   REASON FOR VISIT:  Follow-up for stage IV adenocarcinoma of the right lung   BRIEF ONCOLOGIC HISTORY:  Oncology History  Primary cancer of right upper lobe of lung ([place])  05/14/2015 Procedure   Port placement by Dr. Arnoldo Morale   04/01/2016 Initial Diagnosis   Primary cancer of right upper lobe of lung ([place])   04/02/2016 Procedure   Ultrasound-guided core biopsy performed of a solid 1.5 cm soft tissue nodule in the right chest wall.   04/03/2016 Imaging   MRI brain- No acute and intracranial process or metastasis.  Moderate chronic small vessel ischemic disease.   04/04/2016 Pathology Results   Diagnosis Soft Tissue Needle Core Biopsy, Right Chest Wall SMOOTH MUSCLE NEOPLASM Microscopic Comment The differential diagnosis include low grade leiomyosarcoma. The neoplasm shows spindle cell morphology with rare mitotic figures and minimal atypia, no necrosis present. These cells stains positive for smooth muscle actin, desmin, negative for cytokeratin AE1&3, ck8/18, s100, cd117, cd 34 and cd99. This case also reviewed by Dr. [name] and agree.   04/05/2016 Procedure   CT-guided biopsy of right upper lobe lesion, with tissue specimen sent to pathology, by IR.   04/09/2016 Pathology Results   Lung, needle/core biopsy(ies), Right Upper Lobe - NON SMALL CELL CARCINOMA.   04/17/2016 Pathology Results   PDL1 High Expression.  TPS 50%.   04/24/2016 PET scan   1. Prominently hypermetabolic large right upper lobe mass with metastatic disease to the right paratracheal and right hilar nodal chains, to the descending mesocolon, to the left upper buttock subcutaneous tissues, to the left upper lobe, to the right subscapularis muscle, and possibly to the left adrenal gland and right breast  subcutaneous tissues. Appearance compatible with stage IV lung cancer. 2. There is some focal high activity in the ascending colon which is probably from peristalsis, less likely from a local colon mass. This does raise the possibility that the adjacent mesocolon tumor implant could be due to synchronous colon cancer rather than lung metastasis. 3. Other imaging findings of potential clinical significance: Coronary, aortic arch, and branch vessel atherosclerotic vascular disease. Aortoiliac atherosclerotic vascular disease. Deformity left proximal humerus and left glenohumeral joint from prior trauma. Enlarged prostate gland.   04/26/2016 Pathology Results   FoundationONE: Genomic alterations identified- KRAS U63F, CREBBP splice site [id number], LRP1B S515*, WLS93 T34*, KAJ68 splice site [id number], SPTA1 splice site [id number], TP53 C135Y.  Additional findings- MS-Stable, TMB-intermediate (14 Muts/Mb).  No reportable alterations- EGFR, ALK, BRAF, MET, ERBB2, RET, ROS1.   05/17/2016 -  Chemotherapy   The patient had ONDANSETRON IVPB [facility] +/- DEXAMETHASONE, , Intravenous,  Once, 0 of 1 cycle  pembrolizumab (KEYTRUDA) 200 mg in sodium chloride 0.9 % 50 mL chemo infusion, 200 mg, Intravenous, Once, 0 of 5 cycles  for chemotherapy treatment.     08/06/2016 PET scan   IMPRESSION: 1. Overall considerable improvement. The right upper lobe mass is markedly reduced in volume and SUV. Thoracic adenopathy have resolved and the hypermetabolic lesion in the right subscapularis muscle has also resolved. 2. The left upper lobe nodule is no longer appreciably hypermetabolic, and is highly indistinct although this may be due to motion artifact. Faint in indistinct nodularity elsewhere in the lungs likewise not currently hypermetabolic. 3. There is some very faint residual low-grade activity along the subcutaneous deposit  along the upper left buttock region. Marked reduction in size and activity of  the tumor deposit adjacent to the descending colon. 4. Prior right pleural effusion has resolved. 5. Stable appearance of the adrenal glands, with low-level activity and a nodule in the left adrenal gland. 6. Stable small nodule in the right breast, low-grade metabolic activity with maximum SUV 2.2 (formerly 3.0) 7. Other imaging findings of potential clinical significance: Severe arthropathy of the left glenohumeral joint. Coronary, aortic arch, and branch vessel atherosclerotic vascular disease. Aortoiliac atherosclerotic vascular disease. Prominent prostate gland indents the bladder base.    12/16/2018 -  Chemotherapy   The patient had palonosetron (ALOXI) injection 0.25 mg, 0.25 mg, Intravenous,  Once, 4 of 4 cycles Administration: 0.25 mg (12/16/2018), 0.25 mg (01/27/2019), 0.25 mg (03/03/2019), 0.25 mg (01/06/2019) pegfilgrastim (NEULASTA) injection 6 mg, 6 mg, Subcutaneous, Once, 2 of 2 cycles Administration: 6 mg (01/08/2019) pegfilgrastim (NEULASTA ONPRO KIT) injection 6 mg, 6 mg, Subcutaneous, Once, 2 of 2 cycles pegfilgrastim-cbqv (UDENYCA) injection 6 mg, 6 mg, Subcutaneous, Once, 2 of 2 cycles Administration: 6 mg (01/29/2019), 6 mg (03/05/2019) ondansetron (ZOFRAN) 8 mg in sodium chloride 0.9 % 50 mL IVPB, 8 mg (100 % of original dose 8 mg), Intravenous,  Once, 3 of 3 cycles Dose modification: 8 mg (original dose 8 mg, Cycle 5) Administration: 8 mg (03/24/2019), 8 mg (04/16/2019) PEMEtrexed (ALIMTA) 1,100 mg in sodium chloride 0.9 % 100 mL chemo infusion, 490 mg/m2 = 1,125 mg, Intravenous,  Once, 6 of 6 cycles Dose modification: 400 mg/m2 (80 % of original dose 500 mg/m2, Cycle 3, Reason: Other (see comments), Comment: rash, swelling) Administration: 1,100 mg (12/16/2018), 900 mg (01/27/2019), 900 mg (03/03/2019), 900 mg (03/24/2019), 900 mg (04/16/2019), 1,100 mg (01/06/2019) CARBOplatin (PARAPLATIN) 500 mg in sodium chloride 0.9 % 250 mL chemo infusion, 530 mg (110.7 % of original  dose 477.5 mg), Intravenous,  Once, 4 of 4 cycles Dose modification:   (original dose 477.5 mg, Cycle 1),   (original dose 485.5 mg, Cycle 3),   (original dose 494 mg, Cycle 4),   (original dose 494 mg, Cycle 2) Administration: 500 mg (12/16/2018), 490 mg (01/27/2019), 490 mg (03/03/2019), 490 mg (01/06/2019) pembrolizumab (KEYTRUDA) 200 mg in sodium chloride 0.9 % 50 mL chemo infusion, 2 mg/kg = 200 mg, Intravenous, Once, 3 of 5 cycles Administration: 200 mg (04/16/2019), 200 mg (05/31/2019), 200 mg (05/10/2019) fosaprepitant (EMEND) 150 mg, dexamethasone (DECADRON) 12 mg in sodium chloride 0.9 % 145 mL IVPB, , Intravenous,  Once, 4 of 4 cycles Administration:  (12/16/2018),  (01/27/2019),  (03/03/2019),  (01/06/2019)  for chemotherapy treatment.    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  Two adjacent large masslike areas of consolidation within the right upper lobe with differential considerations including malignancy or pneumonia. Multiple enlarged right hilar and mediastinal lymph nodes which may be reactive or metastatic in etiology.  Additional indeterminate pulmonary nodules as above. Recommend attention on follow-up.  Indeterminate 2.9 cm left adrenal nodule. This needs dedicated evaluation with pre and post contrast-enhanced CT or MRI after confirmation of pulmonary process.  Indeterminate nodule within the right breast. Recommend dedicated evaluation with mammography.  Hepatic steatosis.   04/02/2016 Procedure   Ultrasound-guided core biopsy performed of a solid 1.5 cm soft tissue nodule in the right chest wall.   04/04/2016 Pathology Results   Diagnosis Soft Tissue Needle Core Biopsy, Right  Chest Wall SMOOTH MUSCLE NEOPLASM Microscopic Comment The differential diagnosis include low grade leiomyosarcoma. The neoplasm shows spindle cell  morphology with rare mitotic figures and minimal atypia, no necrosis present. These cells stains positive for smooth muscle actin, desmin, negative for cytokeratin AE1&3, ck8/18, s100, cd117, cd 34 and cd99. This case also reviewed by Dr. [name] and agree.       CANCER STAGING: Cancer Staging Primary cancer of right upper lobe of lung ([place]) Staging form: Lung, AJCC 8th Edition - Clinical: Stage IVB (cT3, cN2, cM1c) - Signed by Baird Cancer, PA-C on 05/07/2016    INTERVAL HISTORY:  Mr. Eisenhour 74 y.o. male seen for follow-up and toxicity assessment prior to next cycle of chemoimmunotherapy.  He is accompanied by his daughter today.  Appetite is 7500%.  Energy levels are low.  Shortness of breath on exertion and chronic cough is stable.  Chronic constipation is also stable.  Leg swellings have improved as he is taking Lasix 20 mg daily.  Numbness in the feet also improved since the start of gabapentin.  REVIEW OF SYSTEMS:  Review of Systems  Respiratory: Positive for cough and shortness of breath.   Neurological: Positive for numbness.  All other systems reviewed and are negative.    PAST MEDICAL/SURGICAL HISTORY:  Past Medical History:  Diagnosis Date  . Adrenal mass, left ([place]) 04/01/2016  . Diabetes mellitus ([place]) 05/02/2016  . Diabetes mellitus without complication ([place])   . Hypertension   . Mass of breast, right 04/01/2016  . Mass of upper lobe of right lung 04/01/2016   2 adjacent, 5 cm masses, hilar adenopathy  . Primary cancer of right upper lobe of lung ([place]) 04/01/2016   2 adjacent, 5 cm masses, hilar adenopathy  . Pulmonary embolus ([place]) 03/31/2016  . 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 COLONOSCOPY N/A 07/28/2017   Procedure: COLONOSCOPY;  Surgeon: Rogene Houston, MD;  Location: [facility] ENDO SUITE;  Service: Endoscopy;  Laterality: N/A;  205  . PORTACATH PLACEMENT Left 05/13/2016   Procedure: INSERTION PORT-A-CATH;   Surgeon: Aviva Signs, MD;  Location: [facility] ORS;  Service: General;  Laterality: Left;     SOCIAL HISTORY:  Social History   Socioeconomic History  . Marital status: Divorced    Spouse name: Not on file  . Number of children: Not on file  . Years of education: Not on file  . Highest education level: Not on file  Occupational History  . Not on file  Tobacco Use  . Smoking status: Former Smoker    Years: 61.00    Quit date: 04/11/2014    Years since quitting: 5.1  . Smokeless tobacco: Never Used  . Tobacco comment: patient does vape smoking x 2 years  Substance and Sexual Activity  . Alcohol use: Not Currently    Comment: quit 10 years  . Drug use: No  . Sexual activity: Not on file  Other Topics Concern  . Not on file  Social History Narrative  . Not on file   Social Determinants of Health   Financial Resource Strain:   . Difficulty of Paying Living Expenses: Not on file  Food Insecurity:   . Worried About Charity fundraiser in the Last Year: Not on file  . Ran Out of Food in the Last Year: Not on file  Transportation Needs: No Transportation Needs  . Lack of Transportation (Medical): No  . Lack of Transportation (Non-Medical): No  Physical Activity:   . Days of Exercise per Week: Not on file  . Minutes of Exercise per Session: Not on file  Stress:   . Feeling of Stress : Not on file  Social Connections:   . Frequency of Communication with Friends and Family: Not on file  . Frequency of Social Gatherings with Friends and Family: Not on file  . Attends Religious Services: Not on file  . Active Member of Clubs or Organizations: Not on file  . Attends Archivist Meetings: Not on file  . Marital Status: Not on file  Intimate Partner Violence:   . Fear of Current or Ex-Partner: Not on file  . Emotionally Abused: Not on file  . Physically Abused: Not on file  . Sexually Abused: Not on file    FAMILY HISTORY:  Family History  Problem Relation Age of Onset   . Stroke Mother   . Heart attack Father   . Heart attack Brother     CURRENT MEDICATIONS:  Outpatient Encounter Medications as of 05/31/2019  Medication Sig  . amLODipine (NORVASC) 2.5 MG tablet Take 2.5 mg by mouth daily.  Marland Kitchen CARBOPLATIN IV Inject into the vein every 21 ( twenty-one) days.  . folic acid (FOLVITE) 1 MG tablet Take 1 tablet (1 mg total) by mouth daily. Start 5-7 days before Alimta chemotherapy. Continue until 21 days after Alimta completed.  . furosemide (LASIX) 20 MG tablet Take 2 tablets (40 mg total) by mouth daily.  Marland Kitchen gabapentin (NEURONTIN) 300 MG capsule Take 1 capsule (300 mg total) by mouth 3 (three) times daily.  Marland Kitchen GLIPIZIDE XL 5 MG 24 hr tablet Take 5 mg by mouth daily.  . magnesium oxide (MAG-OX) 400 (241.3 Mg) MG tablet Take 1 tablet (400 mg total) by mouth 2 (two) times daily.  . metFORMIN (GLUCOPHAGE) 500 MG tablet Take 500 mg by mouth 2 (two) times daily.  . metoprolol succinate (TOPROL-XL) 25 MG 24 hr tablet TAKE ONE TABLET BY MOUTH IN THE EVENING.  . Pembrolizumab (KEYTRUDA IV) Inject into the vein.  Marland Kitchen PEMEtrexed 500 mg/m2 in sodium chloride 0.9 % 100 mL Inject into the vein every 21 ( twenty-one) days.  . potassium chloride (K-DUR,KLOR-CON) 10 MEQ tablet Take 10 mEq by mouth daily.   . simvastatin (ZOCOR) 20 MG tablet Take 20 mg by mouth every evening.  . torsemide (DEMADEX) 20 MG tablet Take 1 tablet (20 mg total) by mouth daily.  Marland Kitchen ALPRAZolam (XANAX) 1 MG tablet Take 1 mg by mouth 3 (three) times daily as needed for anxiety.  . hydrocortisone 2.5 % lotion Apply topically as needed.   . lidocaine-prilocaine (EMLA) cream APPLY A QUARTER SIZE AMOUNT TO THE AFFECTED AREA 1 HOUR PRIOR TO COMING TO CHEMOTHERAPY. COVER WITH A PLASTIC WRAP. (Patient not taking: Reported on 03/24/2019)  . ondansetron (ZOFRAN) 4 MG tablet Take 1 tablet (4 mg total) by mouth every 8 (eight) hours as needed for nausea or vomiting. (Patient not taking: Reported on 02/17/2019)  .  oxyCODONE-acetaminophen (PERCOCET) 10-325 MG tablet Take 1 tablet by mouth as needed.   . prochlorperazine (COMPAZINE) 10 MG tablet Take 1 tablet (10 mg total) by mouth every 6 (six) hours as needed (Nausea or vomiting). (Patient not taking: Reported on 02/04/2019)   No facility-administered encounter medications on file as of 05/31/2019.    ALLERGIES:  No Known Allergies   PHYSICAL EXAM:  ECOG Performance status: 1  Vitals:   05/31/19 0925  BP: (!) 145/67  Pulse:  93  Resp: 20  Temp: 97.8 F (36.6 C)  SpO2: 97%   Filed Weights   05/31/19 0925  Weight: 205 lb (93 kg)    Physical Exam Vitals reviewed.  Constitutional:      Appearance: Normal appearance.  Cardiovascular:     Rate and Rhythm: Normal rate and regular rhythm.     Heart sounds: Normal heart sounds.  Pulmonary:     Effort: Pulmonary effort is normal.     Breath sounds: Normal breath sounds.  Abdominal:     General: There is no distension.     Palpations: Abdomen is soft. There is no mass.  Musculoskeletal:        General: No swelling.  Skin:    General: Skin is warm.  Neurological:     General: No focal deficit present.     Mental Status: He is alert and oriented to person, place, and time.  Psychiatric:        Mood and Affect: Mood normal.        Behavior: Behavior normal.      LABORATORY DATA:  I have reviewed the labs as listed.  CBC    Component Value Date/Time   WBC 13.0 (H) 05/31/2019 0905   RBC 3.24 (L) 05/31/2019 0905   HGB 10.4 (L) 05/31/2019 0905   HGB 13.5 05/02/2016 0852   HCT 33.4 (L) 05/31/2019 0905   HCT 41.0 05/02/2016 0852   PLT 277 05/31/2019 0905   PLT 436 (H) 05/02/2016 0852   MCV 103.1 (H) 05/31/2019 0905   MCV 85.7 05/02/2016 0852   MCH 32.1 05/31/2019 0905   MCHC 31.1 05/31/2019 0905   RDW 16.0 (H) 05/31/2019 0905   RDW 13.9 05/02/2016 0852   LYMPHSABS 4.7 (H) 05/31/2019 0905   LYMPHSABS 3.4 (H) 05/02/2016 0852   MONOABS 1.0 05/31/2019 0905   MONOABS 1.2 (H)  05/02/2016 0852   EOSABS 0.5 05/31/2019 0905   EOSABS 1.6 (H) 05/02/2016 0852   BASOSABS 0.1 05/31/2019 0905   BASOSABS 0.2 (H) 05/02/2016 0852   CMP Latest Ref Rng & Units 05/31/2019 05/10/2019 04/16/2019  Glucose 70 - 99 mg/dL 146(H) 156(H) 113(H)  BUN 8 - 23 mg/dL _0 Creatinine 0.61 - 1.24 mg/dL 1.41(H) 1.43(H) 1.34(H)  Sodium 135 - 145 mmol/L 143 142 143  Potassium 3.5 - 5.1 mmol/L 4.0 3.5 3.6  Chloride 98 - 111 mmol/L 105 105 105  CO2 22 - 32 mmol/L _1 Calcium 8.9 - 10.3 mg/dL 9.0 8.9 8.9  Total Protein 6.5 - 8.1 g/dL 7.1 7.6 -  Total Bilirubin 0.3 - 1.2 mg/dL 0.4 0.8 -  Alkaline Phos 38 - 126 U/L 86 70 -  AST 15 - 41 U/L 27 26 -  ALT 0 - 44 U/L 21 24 -       DIAGNOSTIC IMAGING:  I have independently reviewed the scans and discussed with the patient.     ASSESSMENT & PLAN:   Primary cancer of right upper lobe of lung ([place]) 1.  Stage IV adenocarcinoma of the lung, PDL 1-50%, other actionable mutations negative: - Keytruda from 05/17/2016 through 09/24/2018 with mild progression. -CT of the chest on 12/17/2018 shows posterior right upper lobe mass measured 4.1 x 3.2 cm, previously 5.2 x 3.9 cm.  Right paratracheal lymph node decreased from 12 mm to 9 mm.  No new areas were seen.  Right breast nodule also decreased in size. -4 cycles of carboplatin and pemetrexed from 12/25/2018 through 02/23/2019. -  CT of the chest with contrast from 03/22/2019 shows stable size of the right upper lobe lung mass measuring 4.9 x 3 cm when compared to scan from August.  Stable left adrenal nodule.  Stable small mediastinal nodes. -Pemetrexed maintenance started on 03/24/2019.  On 04/16/2019, he was also started on Keytruda. -At last visit, because of his decreased GFR, we have only given him Keytruda. -We reviewed his labs today.  His creatinine is 1.4.  This precludes him from getting pemetrexed. -I will see him back in 3 weeks for follow-up.  I plan to repeat CT scan of the chest  with contrast.  He has another brain MRI also scheduled.  2.  Brain metastasis: -SRS to the right parietal lobe 16.6 x 13.5 x 15.5 mm lesion with surrounding edema on 11/24/2018. -MRI of the brain on 03/11/2019 showed right parietal operculum metastatic disease smaller, measuring 4.5 mm.  Marked improvement in vasogenic edema. -He is scheduled for another MRI next week.   3.  Leg swelling: -He is taking Lasix 20 mg once daily.  Leg swellings have improved.  4.  Hypomagnesemia: -Magnesium is 1.9 today.  He will continue magnesium 3 times a day..  5.  Peripheral neuropathy: -I started him on gabapentin 300 mg 3 times a day which she is tolerating well. -Pins-and-needles sensation has improved to numbness at this time.      Orders placed this encounter:  Orders Placed This Encounter  Procedures  . CT Chest W Contrast  . CBC with Differential/Platelet  . Comprehensive metabolic panel  . Magnesium      Derek Jack, MD [place] [phone]

## 2019-06-04 ENCOUNTER — Telehealth (HOSPITAL_COMMUNITY): Payer: Self-pay

## 2019-06-04 NOTE — Telephone Encounter (Signed)
Nutrition Follow-up:  Patient with stage IV adenocarcinoma of lung.  Patient receiving Beryle Flock and alimta  Spoke with patient via phone. Reports that appetite has improved.  Eating about 2 good meals per day vs just one.  Goes out to eat for breakfast and supper (meat and 2 vegetables).  "My weight has gone up."   Medications: reviewed  Labs: glucose 146, creatinine 1.41  Anthropometrics:   Weight 205 lb on 1/25 increased from 197 lb on 1/4. Stable from weight in Dec 2020 and October.     NUTRITION DIAGNOSIS: Inadequate oral intake stable   INTERVENTION:  Encouraged patient to continue to eat good sources of protein and well-balanced diet.  Patient does not cook and gets most of meals at [employer].   Patient has contact information    MONITORING, EVALUATION, GOAL: Patient will consume adequate calories and protein to maintain weight and lean muscle mass.    NEXT VISIT: as needed  Wojciech Willetts B. Zenia Resides, [place], [place] Registered Dietitian [phone] (pager)

## 2019-06-17 ENCOUNTER — Ambulatory Visit (HOSPITAL_COMMUNITY)
Admission: RE | Admit: 2019-06-17 | Discharge: 2019-06-17 | Disposition: A | Discharge status: Home / Self Care | Payer: Medicare Other | Source: Ambulatory Visit | Attending: Radiation Oncology | Admitting: Radiation Oncology

## 2019-06-17 ENCOUNTER — Other Ambulatory Visit: Payer: Self-pay

## 2019-06-17 DIAGNOSIS — C7931 Secondary malignant neoplasm of brain: Secondary | ICD-10-CM | POA: Diagnosis not present

## 2019-06-17 MED ORDER — GADOBUTROL 1 MMOL/ML IV SOLN
10.0000 mL | Freq: Once | INTRAVENOUS | Status: AC | PRN
  Administered 2019-06-17: 10 mL via INTRAVENOUS

## 2019-06-21 ENCOUNTER — Other Ambulatory Visit: Payer: Self-pay

## 2019-06-21 ENCOUNTER — Ambulatory Visit
Admission: RE | Admit: 2019-06-21 | Discharge: 2019-06-21 | Disposition: A | Discharge status: Home / Self Care | Payer: Medicare Other | Source: Ambulatory Visit | Attending: Radiation Oncology | Admitting: Radiation Oncology

## 2019-06-21 ENCOUNTER — Encounter: Payer: Self-pay | Admitting: Radiation Oncology

## 2019-06-21 DIAGNOSIS — C3411 Malignant neoplasm of upper lobe, right bronchus or lung: Secondary | ICD-10-CM

## 2019-06-21 DIAGNOSIS — Z85118 Personal history of other malignant neoplasm of bronchus and lung: Secondary | ICD-10-CM | POA: Diagnosis not present

## 2019-06-21 DIAGNOSIS — Z08 Encounter for follow-up examination after completed treatment for malignant neoplasm: Secondary | ICD-10-CM | POA: Diagnosis not present

## 2019-06-21 DIAGNOSIS — Z85841 Personal history of malignant neoplasm of brain: Secondary | ICD-10-CM | POA: Diagnosis not present

## 2019-06-21 DIAGNOSIS — F17290 Nicotine dependence, other tobacco product, uncomplicated: Secondary | ICD-10-CM | POA: Diagnosis not present

## 2019-06-21 DIAGNOSIS — C7931 Secondary malignant neoplasm of brain: Secondary | ICD-10-CM

## 2019-06-21 NOTE — Progress Notes (Signed)
Radiation Oncology         [phone] ________________________________  Outpatient Follow Up - Conducted via telephone due to current COVID-19 concerns for limiting patient exposure  I spoke with the patient to conduct this consult visit via telephone to spare the patient unnecessary potential exposure in the healthcare setting during the current COVID-19 pandemic. The patient was notified in advance and was offered a [place] meeting to allow for face to face communication but unfortunately reported that they did not have the appropriate resources/technology to support such a visit and instead preferred to proceed with a telephone visit. ________________________________  Name: Darrell Christensen MRN: [id number]  Date of Service: 06/21/2019  DOB: 13-Jul-1945   Diagnosis:   Progressive Metastatic Stage IV NSCLC, adenocarcinoma of the RUL with new brain metastasis.  Interval Since Last Radiation:  7  months  11/18/2018-11/24/2018 [facility] Treatment:  PTV1 Left Parietal 17 mm received 27 Gy in 3 fractions  Narrative:  Darrell Christensen is a pleasant 74 y.o. gentleman with a history of Stage IV, NSCLC, adenocarcinoma of the RUL.  He presented in 2017 with stage IV disease and received Keytruda between January 2018 and Sep 24, 2018 when he was found to have progressive disease in the right upper lobe.  He was seen by Dr. Julien Nordmann for a second opinion and was encouraged to consider chemoimmunotherapy for 4 cycles with carboplatin and pemetrexed along with his Keytruda.  He was in the midst of getting this authorized, and presented with slurred speech after a fall.  He had been having approximately 3 weeks of headaches as well, and was sent for an MRI of the brain yesterday.  He received 1 mg of IV Ativan through his Port-A-Cath.  His scan revealed a 22 mm brain metastasis in the posterior aspect of the right temporal lobe with significant vasogenic edema in the right hemisphere.  Only mild regional mass-effect was noted  without any midline shift.  He proceeded with stereotactic radiosurgery in 3 fractions which he completed in July 2020.  He has since been on systemic therapy with Dr. Delton Coombes and he has been receiving Keytruda and Pemetrexed was held due to renal function. He's due for restaging imaging in the next few weeks. He had a recent MRI of the brain on 06/17/19 which revealed post treatment changes in the right parietal region and no new disease. He's contacted to review these results.    On review of systems, the patient reports that he is doing well overall. The patient continues with a dry cough. He denies any chest pain, shortness of breath,  fevers, chills, night sweats, unintended weight changes. He denies any headaches or changes in gait, visual or auditory dysfunctions. He denies any bowel or bladder disturbances, and denies abdominal pain, nausea or vomiting. He denies any new musculoskeletal or joint aches or pains, new skin lesions or concerns. A complete review of systems is obtained and is otherwise negative.  Past Medical History:  Past Medical History:  Diagnosis Date  . Adrenal mass, left ([place]) 04/01/2016  . Diabetes mellitus ([place]) 05/02/2016  . Diabetes mellitus without complication ([place])   . Hypertension   . Mass of breast, right 04/01/2016  . Mass of upper lobe of right lung 04/01/2016   2 adjacent, 5 cm masses, hilar adenopathy  . Primary cancer of right upper lobe of lung ([place]) 04/01/2016   2 adjacent, 5 cm masses, hilar adenopathy  . Pulmonary embolus ([place]) 03/31/2016  . 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 COLONOSCOPY N/A 07/28/2017   Procedure: COLONOSCOPY;  Surgeon: Rogene Houston, MD;  Location: [facility];  Service: Endoscopy;  Laterality: N/A;  205  . PORTACATH PLACEMENT Left 05/13/2016   Procedure: INSERTION PORT-A-CATH;  Surgeon: Aviva Signs, MD;  Location: [facility] ORS;  Service: General;   Laterality: Left;    Social History:  Social History   Socioeconomic History  . Marital status: Divorced    Spouse name: Not on file  . Number of children: Not on file  . Years of education: Not on file  . Highest education level: Not on file  Occupational History  . Not on file  Tobacco Use  . Smoking status: Former Smoker    Years: 61.00    Quit date: 04/11/2014    Years since quitting: 5.1  . Smokeless tobacco: Never Used  . Tobacco comment: patient does vape smoking x 2 years  Substance and Sexual Activity  . Alcohol use: Not Currently    Comment: quit 10 years  . Drug use: No  . Sexual activity: Not on file  Other Topics Concern  . Not on file  Social History Narrative  . Not on file   Social Determinants of Health   Financial Resource Strain:   . Difficulty of Paying Living Expenses: Not on file  Food Insecurity:   . Worried About Charity fundraiser in the Last Year: Not on file  . Ran Out of Food in the Last Year: Not on file  Transportation Needs: No Transportation Needs  . Lack of Transportation (Medical): No  . Lack of Transportation (Non-Medical): No  Physical Activity:   . Days of Exercise per Week: Not on file  . Minutes of Exercise per Session: Not on file  Stress:   . Feeling of Stress : Not on file  Social Connections:   . Frequency of Communication with Friends and Family: Not on file  . Frequency of Social Gatherings with Friends and Family: Not on file  . Attends Religious Services: Not on file  . Active Member of Clubs or Organizations: Not on file  . Attends Archivist Meetings: Not on file  . Marital Status: Not on file  Intimate Partner Violence:   . Fear of Current or Ex-Partner: Not on file  . Emotionally Abused: Not on file  . Physically Abused: Not on file  . Sexually Abused: Not on file  The patient is divorced and lives in [place].   Family History: Family History  Problem Relation Age of Onset  . Stroke Mother     . Heart attack Father   . Heart attack Brother    Medications: Current Outpatient Medications  Medication Sig Dispense Refill  . ALPRAZolam (XANAX) 1 MG tablet Take 1 mg by mouth 3 (three) times daily as needed for anxiety.    Marland Kitchen amLODipine (NORVASC) 2.5 MG tablet Take 2.5 mg by mouth daily.  4  . CARBOPLATIN IV Inject into the vein every 21 ( twenty-one) days.    . folic acid (FOLVITE) 1 MG tablet Take 1 tablet (1 mg total) by mouth daily. Start 5-7 days before Alimta chemotherapy. Continue until 21 days after Alimta completed. 100 tablet 3  . furosemide (LASIX) 20 MG tablet Take 2 tablets (40 mg total) by mouth daily. 20 tablet 0  . gabapentin (NEURONTIN) 300 MG capsule Take 1 capsule (300 mg total) by mouth 3 (three) times daily. [place]  capsule 2  . hydrocortisone 2.5 % lotion Apply topically as needed.     . lidocaine-prilocaine (EMLA) cream APPLY A QUARTER SIZE AMOUNT TO THE AFFECTED AREA 1 HOUR PRIOR TO COMING TO CHEMOTHERAPY. COVER WITH A PLASTIC WRAP. 30 g 0  . magnesium oxide (MAG-OX) 400 (241.3 Mg) MG tablet Take 1 tablet (400 mg total) by mouth 2 (two) times daily. 60 tablet 2  . metFORMIN (GLUCOPHAGE) 500 MG tablet Take 500 mg by mouth 2 (two) times daily.  4  . metoprolol succinate (TOPROL-XL) 25 MG 24 hr tablet TAKE ONE TABLET BY MOUTH IN THE EVENING. 30 tablet 0  . ondansetron (ZOFRAN) 4 MG tablet Take 1 tablet (4 mg total) by mouth every 8 (eight) hours as needed for nausea or vomiting. 4 tablet 0  . oxyCODONE-acetaminophen (PERCOCET) 10-325 MG tablet Take 1 tablet by mouth as needed.   0  . Pembrolizumab (KEYTRUDA IV) Inject into the vein.    Marland Kitchen PEMEtrexed 500 mg/m2 in sodium chloride 0.9 % 100 mL Inject into the vein every 21 ( twenty-one) days.    . potassium chloride (K-DUR,KLOR-CON) 10 MEQ tablet Take 10 mEq by mouth daily.   0  . prochlorperazine (COMPAZINE) 10 MG tablet Take 1 tablet (10 mg total) by mouth every 6 (six) hours as needed (Nausea or vomiting). 30 tablet 1  .  simvastatin (ZOCOR) 20 MG tablet Take 20 mg by mouth every evening.  5  . torsemide (DEMADEX) 20 MG tablet Take 1 tablet (20 mg total) by mouth daily. 30 tablet 1  . GLIPIZIDE XL 5 MG 24 hr tablet Take 5 mg by mouth daily.  4   No current facility-administered medications for this encounter.   Allergies: No Known Allergies  Physical Exam: Unable to assess due to encounter type.  Impression/Plan: 1. Progressive Metastatic Stage IV NSCLC, adenocarcinoma of the RUL with  brain metastasis. The patient remains radiographically without disease in the brain. He will continue with routine surveillance and we recommend repeat MRI in 3 months per discussion in brain oncology conference. He will continue as well with Dr. Delton Coombes to receive systemic therapy and follow up with CT imaging in the near future as well.  2. Claustraphobia. He will continue to premedicate with Ativan prior to MRI scans in the future.     Given current concerns for patient exposure during the COVID-19 pandemic, this encounter was conducted via telephone.  The patient has given verbal consent for this type of encounter. The time spent during this encounter was 25 minutes and 50% of that time was spent in the preparation, discussion, and coordination of his care. The attendants for this meeting include Shona Simpson, [facility] and Merita Norton  During the encounter, Shona Simpson [facility] was working remotely at home. Rocky G Cottman  was located at home.    Carola Rhine, PAC

## 2019-06-22 ENCOUNTER — Inpatient Hospital Stay (HOSPITAL_COMMUNITY): Payer: Medicare Other | Attending: Hematology | Admitting: Hematology

## 2019-06-22 ENCOUNTER — Other Ambulatory Visit: Payer: Self-pay

## 2019-06-22 ENCOUNTER — Encounter (HOSPITAL_COMMUNITY): Payer: Self-pay | Admitting: Hematology

## 2019-06-22 ENCOUNTER — Inpatient Hospital Stay (HOSPITAL_COMMUNITY): Payer: Medicare Other

## 2019-06-22 VITALS — BP 134/77 | HR 77 | Temp 97.1°F | Resp 18

## 2019-06-22 VITALS — BP 147/71 | HR 74 | Temp 97.1°F | Resp 16 | Wt 211.4 lb

## 2019-06-22 DIAGNOSIS — Z79899 Other long term (current) drug therapy: Secondary | ICD-10-CM | POA: Insufficient documentation

## 2019-06-22 DIAGNOSIS — C3411 Malignant neoplasm of upper lobe, right bronchus or lung: Secondary | ICD-10-CM | POA: Diagnosis not present

## 2019-06-22 DIAGNOSIS — E1142 Type 2 diabetes mellitus with diabetic polyneuropathy: Secondary | ICD-10-CM | POA: Insufficient documentation

## 2019-06-22 DIAGNOSIS — M7989 Other specified soft tissue disorders: Secondary | ICD-10-CM | POA: Insufficient documentation

## 2019-06-22 DIAGNOSIS — C7931 Secondary malignant neoplasm of brain: Secondary | ICD-10-CM | POA: Insufficient documentation

## 2019-06-22 DIAGNOSIS — N6310 Unspecified lump in the right breast, unspecified quadrant: Secondary | ICD-10-CM | POA: Diagnosis not present

## 2019-06-22 DIAGNOSIS — Z5111 Encounter for antineoplastic chemotherapy: Secondary | ICD-10-CM | POA: Insufficient documentation

## 2019-06-22 LAB — CBC WITH DIFFERENTIAL/PLATELET
Abs Immature Granulocytes: 0.04 10*3/uL (ref 0.00–0.07)
Basophils Absolute: 0.1 10*3/uL (ref 0.0–0.1)
Basophils Relative: 1 %
Eosinophils Absolute: 0.9 10*3/uL — ABNORMAL HIGH (ref 0.0–0.5)
Eosinophils Relative: 9 %
HCT: 35.6 % — ABNORMAL LOW (ref 39.0–52.0)
Hemoglobin: 10.9 g/dL — ABNORMAL LOW (ref 13.0–17.0)
Immature Granulocytes: 0 %
Lymphocytes Relative: 39 %
Lymphs Abs: 4.1 10*3/uL — ABNORMAL HIGH (ref 0.7–4.0)
MCH: 31.1 pg (ref 26.0–34.0)
MCHC: 30.6 g/dL (ref 30.0–36.0)
MCV: 101.7 fL — ABNORMAL HIGH (ref 80.0–100.0)
Monocytes Absolute: 0.9 10*3/uL (ref 0.1–1.0)
Monocytes Relative: 9 %
Neutro Abs: 4.3 10*3/uL (ref 1.7–7.7)
Neutrophils Relative %: 42 %
Platelets: 254 10*3/uL (ref 150–400)
RBC: 3.5 MIL/uL — ABNORMAL LOW (ref 4.22–5.81)
RDW: 15 % (ref 11.5–15.5)
WBC: 10.3 10*3/uL (ref 4.0–10.5)
nRBC: 0 % (ref 0.0–0.2)

## 2019-06-22 LAB — COMPREHENSIVE METABOLIC PANEL
ALT: 18 U/L (ref 0–44)
AST: 20 U/L (ref 15–41)
Albumin: 3.8 g/dL (ref 3.5–5.0)
Alkaline Phosphatase: 96 U/L (ref 38–126)
Anion gap: 10 (ref 5–15)
BUN: 24 mg/dL — ABNORMAL HIGH (ref 8–23)
CO2: 27 mmol/L (ref 22–32)
Calcium: 8.9 mg/dL (ref 8.9–10.3)
Chloride: 105 mmol/L (ref 98–111)
Creatinine, Ser: 1.41 mg/dL — ABNORMAL HIGH (ref 0.61–1.24)
GFR calc Af Amer: 56 mL/min — ABNORMAL LOW (ref >60)
GFR calc non Af Amer: 49 mL/min — ABNORMAL LOW (ref >60)
Glucose, Bld: 146 mg/dL — ABNORMAL HIGH (ref 70–99)
Potassium: 3.9 mmol/L (ref 3.5–5.1)
Sodium: 142 mmol/L (ref 135–145)
Total Bilirubin: 0.5 mg/dL (ref 0.3–1.2)
Total Protein: 7.1 g/dL (ref 6.5–8.1)

## 2019-06-22 LAB — MAGNESIUM: Magnesium: 2 mg/dL (ref 1.7–2.4)

## 2019-06-22 MED ORDER — SODIUM CHLORIDE 0.9 % IV SOLN: Freq: Once | INTRAVENOUS | Status: AC

## 2019-06-22 MED ORDER — SODIUM CHLORIDE 0.9% FLUSH
10.0000 mL | INTRAVENOUS | Status: DC | PRN
  Administered 2019-06-22: 11:00:00 10 mL

## 2019-06-22 MED ORDER — HEPARIN SOD (PORK) LOCK FLUSH 100 UNIT/ML IV SOLN
500.0000 [IU] | Freq: Once | INTRAVENOUS | Status: AC | PRN
  Administered 2019-06-22: 500 [IU]

## 2019-06-22 MED ORDER — SODIUM CHLORIDE 0.9 % IV SOLN
2.0000 mg/kg | Freq: Once | INTRAVENOUS | Status: AC
  Administered 2019-06-22: 200 mg via INTRAVENOUS
  Filled 2019-06-22: qty 8

## 2019-06-22 NOTE — Patient Instructions (Signed)
[facility] Discharge Instructions for Patients Receiving Chemotherapy  Today you received the following chemotherapy agents   To help prevent nausea and vomiting after your treatment, we encourage you to take your nausea medication   If you develop nausea and vomiting that is not controlled by your nausea medication, call the clinic.   BELOW ARE SYMPTOMS THAT SHOULD BE REPORTED IMMEDIATELY:  *FEVER GREATER THAN 100.5 F  *CHILLS WITH OR WITHOUT FEVER  NAUSEA AND VOMITING THAT IS NOT CONTROLLED WITH YOUR NAUSEA MEDICATION  *UNUSUAL SHORTNESS OF BREATH  *UNUSUAL BRUISING OR BLEEDING  TENDERNESS IN MOUTH AND THROAT WITH OR WITHOUT PRESENCE OF ULCERS  *URINARY PROBLEMS  *BOWEL PROBLEMS  UNUSUAL RASH Items with * indicate a potential emergency and should be followed up as soon as possible.  Feel free to call the clinic should you have any questions or concerns. The clinic phone number is [phone].  Please show the CHEMO ALERT CARD at check-in to the Emergency Department and triage nurse.   

## 2019-06-22 NOTE — Patient Instructions (Addendum)
[place] at [facility] Discharge Instructions  You were seen today by Dr. Delton Coombes. He went over your recent lab results. He will schedule you for repeat CT scan. He will see you back in 3 weeks for labs and follow up.   Thank you for choosing [place] at [facility] to provide your oncology and hematology care.  To afford each patient quality time with our provider, please arrive at least 15 minutes before your scheduled appointment time.   If you have a lab appointment with the [facility] please come in thru the  Main Entrance and check in at the main information desk  You need to re-schedule your appointment should you arrive 10 or more minutes late.  We strive to give you quality time with our providers, and arriving late affects you and other patients whose appointments are after yours.  Also, if you no show three or more times for appointments you may be dismissed from the clinic at the providers discretion.     Again, thank you for choosing [facility].  Our hope is that these requests will decrease the amount of time that you wait before being seen by our physicians.       _____________________________________________________________  Should you have questions after your visit to [facility], please contact our office at [phone] between the hours of 8:00 a.m. and 4:30 p.m.  Voicemails left after 4:00 p.m. will not be returned until the following business day.  For prescription refill requests, have your pharmacy contact our office and allow 72 hours.    Cancer Center Support Programs:   > Cancer Support Group  2nd Tuesday of the month 1pm-2pm, Journey Room

## 2019-06-22 NOTE — Progress Notes (Signed)
Patient presents today for treatment and follow up visit with Dr. Delton Coombes. Vital signs within parameters for treatment. Labs reviewed by Dr. Delton Coombes. Message received to proceed with treatment today.   Treatment given today per MD orders. Tolerated infusion without adverse affects. Vital signs stable. No complaints at this time. Discharged from clinic ambulatory. F/U with [facility] Meeting as scheduled.

## 2019-06-22 NOTE — Progress Notes (Signed)
Darrell Christensen, Darrell Christensen [id number]   CLINIC:  Medical Oncology/Hematology  PCP:  Celene Squibb, MD [place] [address] [phone]   REASON FOR VISIT:  Follow-up for stage IV adenocarcinoma of the right lung   BRIEF ONCOLOGIC HISTORY:  Oncology History  Primary cancer of right upper lobe of lung ([place])  05/14/2015 Procedure   Port placement by Dr. Arnoldo Morale   04/01/2016 Initial Diagnosis   Primary cancer of right upper lobe of lung ([place])   04/02/2016 Procedure   Ultrasound-guided core biopsy performed of a solid 1.5 cm soft tissue nodule in the right chest wall.   04/03/2016 Imaging   MRI brain- No acute and intracranial process or metastasis.  Moderate chronic small vessel ischemic disease.   04/04/2016 Pathology Results   Diagnosis Soft Tissue Needle Core Biopsy, Right Chest Wall SMOOTH MUSCLE NEOPLASM Microscopic Comment The differential diagnosis include low grade leiomyosarcoma. The neoplasm shows spindle cell morphology with rare mitotic figures and minimal atypia, no necrosis present. These cells stains positive for smooth muscle actin, desmin, negative for cytokeratin AE1&3, ck8/18, s100, cd117, cd 34 and cd99. This case also reviewed by Dr. [name] and agree.   04/05/2016 Procedure   CT-guided biopsy of right upper lobe lesion, with tissue specimen sent to pathology, by IR.   04/09/2016 Pathology Results   Lung, needle/core biopsy(ies), Right Upper Lobe - NON SMALL CELL CARCINOMA.   04/17/2016 Pathology Results   PDL1 High Expression.  TPS 50%.   04/24/2016 PET scan   1. Prominently hypermetabolic large right upper lobe mass with metastatic disease to the right paratracheal and right hilar nodal chains, to the descending mesocolon, to the left upper buttock subcutaneous tissues, to the left upper lobe, to the right subscapularis muscle, and possibly to the left adrenal gland and right breast  subcutaneous tissues. Appearance compatible with stage IV lung cancer. 2. There is some focal high activity in the ascending colon which is probably from peristalsis, less likely from a local colon mass. This does raise the possibility that the adjacent mesocolon tumor implant could be due to synchronous colon cancer rather than lung metastasis. 3. Other imaging findings of potential clinical significance: Coronary, aortic arch, and branch vessel atherosclerotic vascular disease. Aortoiliac atherosclerotic vascular disease. Deformity left proximal humerus and left glenohumeral joint from prior trauma. Enlarged prostate gland.   04/26/2016 Pathology Results   FoundationONE: Genomic alterations identified- KRAS X83A, CREBBP splice site [id number], LRP1B S515*, AYO45 T97*, FSF42 splice site [id number], SPTA1 splice site [id number], TP53 C135Y.  Additional findings- MS-Stable, TMB-intermediate (14 Muts/Mb).  No reportable alterations- EGFR, ALK, BRAF, MET, ERBB2, RET, ROS1.   05/17/2016 -  Chemotherapy   The patient had ONDANSETRON IVPB [facility] +/- DEXAMETHASONE, , Intravenous,  Once, 0 of 1 cycle  pembrolizumab (KEYTRUDA) 200 mg in sodium chloride 0.9 % 50 mL chemo infusion, 200 mg, Intravenous, Once, 0 of 5 cycles  for chemotherapy treatment.     08/06/2016 PET scan   IMPRESSION: 1. Overall considerable improvement. The right upper lobe mass is markedly reduced in volume and SUV. Thoracic adenopathy have resolved and the hypermetabolic lesion in the right subscapularis muscle has also resolved. 2. The left upper lobe nodule is no longer appreciably hypermetabolic, and is highly indistinct although this may be due to motion artifact. Faint in indistinct nodularity elsewhere in the lungs likewise not currently hypermetabolic. 3. There is some very faint residual low-grade activity along the subcutaneous deposit  along the upper left buttock region. Marked reduction in size and activity of  the tumor deposit adjacent to the descending colon. 4. Prior right pleural effusion has resolved. 5. Stable appearance of the adrenal glands, with low-level activity and a nodule in the left adrenal gland. 6. Stable small nodule in the right breast, low-grade metabolic activity with maximum SUV 2.2 (formerly 3.0) 7. Other imaging findings of potential clinical significance: Severe arthropathy of the left glenohumeral joint. Coronary, aortic arch, and branch vessel atherosclerotic vascular disease. Aortoiliac atherosclerotic vascular disease. Prominent prostate gland indents the bladder base.    12/16/2018 -  Chemotherapy   The patient had palonosetron (ALOXI) injection 0.25 mg, 0.25 mg, Intravenous,  Once, 4 of 4 cycles Administration: 0.25 mg (12/16/2018), 0.25 mg (01/27/2019), 0.25 mg (03/03/2019), 0.25 mg (01/06/2019) pegfilgrastim (NEULASTA) injection 6 mg, 6 mg, Subcutaneous, Once, 2 of 2 cycles Administration: 6 mg (01/08/2019) pegfilgrastim (NEULASTA ONPRO KIT) injection 6 mg, 6 mg, Subcutaneous, Once, 2 of 2 cycles pegfilgrastim-cbqv (UDENYCA) injection 6 mg, 6 mg, Subcutaneous, Once, 2 of 2 cycles Administration: 6 mg (01/29/2019), 6 mg (03/05/2019) ondansetron (ZOFRAN) 8 mg in sodium chloride 0.9 % 50 mL IVPB, 8 mg (100 % of original dose 8 mg), Intravenous,  Once, 3 of 3 cycles Dose modification: 8 mg (original dose 8 mg, Cycle 5) Administration: 8 mg (03/24/2019), 8 mg (04/16/2019) PEMEtrexed (ALIMTA) 1,100 mg in sodium chloride 0.9 % 100 mL chemo infusion, 490 mg/m2 = 1,125 mg, Intravenous,  Once, 6 of 6 cycles Dose modification: 400 mg/m2 (80 % of original dose 500 mg/m2, Cycle 3, Reason: Other (see comments), Comment: rash, swelling) Administration: 1,100 mg (12/16/2018), 900 mg (01/27/2019), 900 mg (03/03/2019), 900 mg (03/24/2019), 900 mg (04/16/2019), 1,100 mg (01/06/2019) CARBOplatin (PARAPLATIN) 500 mg in sodium chloride 0.9 % 250 mL chemo infusion, 530 mg (110.7 % of original  dose 477.5 mg), Intravenous,  Once, 4 of 4 cycles Dose modification:   (original dose 477.5 mg, Cycle 1),   (original dose 485.5 mg, Cycle 3),   (original dose 494 mg, Cycle 4),   (original dose 494 mg, Cycle 2) Administration: 500 mg (12/16/2018), 490 mg (01/27/2019), 490 mg (03/03/2019), 490 mg (01/06/2019) pembrolizumab (KEYTRUDA) 200 mg in sodium chloride 0.9 % 50 mL chemo infusion, 2 mg/kg = 200 mg, Intravenous, Once, 4 of 5 cycles Administration: 200 mg (04/16/2019), 200 mg (05/31/2019), 200 mg (06/22/2019), 200 mg (05/10/2019) fosaprepitant (EMEND) 150 mg, dexamethasone (DECADRON) 12 mg in sodium chloride 0.9 % 145 mL IVPB, , Intravenous,  Once, 4 of 4 cycles Administration:  (12/16/2018),  (01/27/2019),  (03/03/2019),  (01/06/2019)  for chemotherapy treatment.    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  Two adjacent large masslike areas of consolidation within the right upper lobe with differential considerations including malignancy or pneumonia. Multiple enlarged right hilar and mediastinal lymph nodes which may be reactive or metastatic in etiology.  Additional indeterminate pulmonary nodules as above. Recommend attention on follow-up.  Indeterminate 2.9 cm left adrenal nodule. This needs dedicated evaluation with pre and post contrast-enhanced CT or MRI after confirmation of pulmonary process.  Indeterminate nodule within the right breast. Recommend dedicated evaluation with mammography.  Hepatic steatosis.   04/02/2016 Procedure   Ultrasound-guided core biopsy performed of a solid 1.5 cm soft tissue nodule in the right chest wall.   04/04/2016 Pathology Results   Diagnosis Soft Tissue Needle  Core Biopsy, Right Chest Wall SMOOTH MUSCLE NEOPLASM Microscopic Comment The differential diagnosis include low grade leiomyosarcoma. The neoplasm  shows spindle cell morphology with rare mitotic figures and minimal atypia, no necrosis present. These cells stains positive for smooth muscle actin, desmin, negative for cytokeratin AE1&3, ck8/18, s100, cd117, cd 34 and cd99. This case also reviewed by Dr. [name] and agree.       CANCER STAGING: Cancer Staging Primary cancer of right upper lobe of lung ([place]) Staging form: Lung, AJCC 8th Edition - Clinical: Stage IVB (cT3, cN2, cM1c) - Signed by Baird Cancer, PA-C on 05/07/2016    INTERVAL HISTORY:  Mr. Seider 74 y.o. male seen for follow-up of stage IV adenocarcinoma of the lung and toxicity assessment prior to treatment.  Appetite is 100%.  Energy levels are 25%.  Numbness in hands and feet has been stable.  Is accompanied by his daughter today.  Overall he feels better since last treatment as he did not have any GI side effects.  Chronic constipation is stable.  REVIEW OF SYSTEMS:  Review of Systems  Respiratory: Positive for shortness of breath.   Gastrointestinal: Positive for constipation.  Neurological: Positive for numbness.  All other systems reviewed and are negative.    PAST MEDICAL/SURGICAL HISTORY:  Past Medical History:  Diagnosis Date  . Adrenal mass, left ([place]) 04/01/2016  . Diabetes mellitus ([place]) 05/02/2016  . Diabetes mellitus without complication ([place])   . Hypertension   . Mass of breast, right 04/01/2016  . Mass of upper lobe of right lung 04/01/2016   2 adjacent, 5 cm masses, hilar adenopathy  . Primary cancer of right upper lobe of lung ([place]) 04/01/2016   2 adjacent, 5 cm masses, hilar adenopathy  . Pulmonary embolus ([place]) 03/31/2016  . 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 COLONOSCOPY N/A 07/28/2017   Procedure: COLONOSCOPY;  Surgeon: Rogene Houston, MD;  Location: [facility] ENDO SUITE;  Service: Endoscopy;  Laterality: N/A;  205  . PORTACATH PLACEMENT Left 05/13/2016   Procedure: INSERTION PORT-A-CATH;   Surgeon: Aviva Signs, MD;  Location: [facility] ORS;  Service: General;  Laterality: Left;     SOCIAL HISTORY:  Social History   Socioeconomic History  . Marital status: Divorced    Spouse name: Not on file  . Number of children: Not on file  . Years of education: Not on file  . Highest education level: Not on file  Occupational History  . Not on file  Tobacco Use  . Smoking status: Former Smoker    Years: 61.00    Quit date: 04/11/2014    Years since quitting: 5.2  . Smokeless tobacco: Never Used  . Tobacco comment: patient does vape smoking x 2 years  Substance and Sexual Activity  . Alcohol use: Not Currently    Comment: quit 10 years  . Drug use: No  . Sexual activity: Not on file  Other Topics Concern  . Not on file  Social History Narrative  . Not on file   Social Determinants of Health   Financial Resource Strain:   . Difficulty of Paying Living Expenses: Not on file  Food Insecurity:   . Worried About Charity fundraiser in the Last Year: Not on file  . Ran Out of Food in the Last Year: Not on file  Transportation Needs: No Transportation Needs  . Lack of Transportation (Medical): No  . Lack of Transportation (Non-Medical): No  Physical  Activity:   . Days of Exercise per Week: Not on file  . Minutes of Exercise per Session: Not on file  Stress:   . Feeling of Stress : Not on file  Social Connections:   . Frequency of Communication with Friends and Family: Not on file  . Frequency of Social Gatherings with Friends and Family: Not on file  . Attends Religious Services: Not on file  . Active Member of Clubs or Organizations: Not on file  . Attends Archivist Meetings: Not on file  . Marital Status: Not on file  Intimate Partner Violence:   . Fear of Current or Ex-Partner: Not on file  . Emotionally Abused: Not on file  . Physically Abused: Not on file  . Sexually Abused: Not on file    FAMILY HISTORY:  Family History  Problem Relation Age of Onset   . Stroke Mother   . Heart attack Father   . Heart attack Brother     CURRENT MEDICATIONS:  Outpatient Encounter Medications as of 06/22/2019  Medication Sig  . ALPRAZolam (XANAX) 1 MG tablet Take 1 mg by mouth 3 (three) times daily as needed for anxiety.  Marland Kitchen amLODipine (NORVASC) 2.5 MG tablet Take 2.5 mg by mouth daily.  Marland Kitchen CARBOPLATIN IV Inject into the vein every 21 ( twenty-one) days.  . folic acid (FOLVITE) 1 MG tablet Take 1 tablet (1 mg total) by mouth daily. Start 5-7 days before Alimta chemotherapy. Continue until 21 days after Alimta completed.  . furosemide (LASIX) 20 MG tablet Take 2 tablets (40 mg total) by mouth daily.  Marland Kitchen gabapentin (NEURONTIN) 300 MG capsule Take 1 capsule (300 mg total) by mouth 3 (three) times daily.  Marland Kitchen GLIPIZIDE XL 5 MG 24 hr tablet Take 5 mg by mouth daily.  . hydrocortisone 2.5 % lotion Apply topically as needed.   . lidocaine-prilocaine (EMLA) cream APPLY A QUARTER SIZE AMOUNT TO THE AFFECTED AREA 1 HOUR PRIOR TO COMING TO CHEMOTHERAPY. COVER WITH A PLASTIC WRAP.  . magnesium oxide (MAG-OX) 400 (241.3 Mg) MG tablet Take 1 tablet (400 mg total) by mouth 2 (two) times daily.  . metFORMIN (GLUCOPHAGE) 500 MG tablet Take 500 mg by mouth 2 (two) times daily.  . metoprolol succinate (TOPROL-XL) 25 MG 24 hr tablet TAKE ONE TABLET BY MOUTH IN THE EVENING.  Marland Kitchen ondansetron (ZOFRAN) 4 MG tablet Take 1 tablet (4 mg total) by mouth every 8 (eight) hours as needed for nausea or vomiting.  Marland Kitchen oxyCODONE-acetaminophen (PERCOCET) 10-325 MG tablet Take 1 tablet by mouth as needed.   . Pembrolizumab (KEYTRUDA IV) Inject into the vein.  Marland Kitchen PEMEtrexed 500 mg/m2 in sodium chloride 0.9 % 100 mL Inject into the vein every 21 ( twenty-one) days.  . potassium chloride (K-DUR,KLOR-CON) 10 MEQ tablet Take 10 mEq by mouth daily.   . prochlorperazine (COMPAZINE) 10 MG tablet Take 1 tablet (10 mg total) by mouth every 6 (six) hours as needed (Nausea or vomiting).  . simvastatin (ZOCOR) 20  MG tablet Take 20 mg by mouth every evening.  . torsemide (DEMADEX) 20 MG tablet Take 1 tablet (20 mg total) by mouth daily.   No facility-administered encounter medications on file as of 06/22/2019.    ALLERGIES:  No Known Allergies   PHYSICAL EXAM:  ECOG Performance status: 1  Vitals:   06/22/19 0800  BP: (!) 147/71  Pulse: 74  Resp: 16  Temp: (!) 97.1 F (36.2 C)  SpO2: 95%   Filed Weights  06/22/19 0800  Weight: 211 lb 7 oz (95.9 kg)    Physical Exam Vitals reviewed.  Constitutional:      Appearance: Normal appearance.  Cardiovascular:     Rate and Rhythm: Normal rate and regular rhythm.     Heart sounds: Normal heart sounds.  Pulmonary:     Effort: Pulmonary effort is normal.     Breath sounds: Normal breath sounds.  Abdominal:     General: There is no distension.     Palpations: Abdomen is soft. There is no mass.  Musculoskeletal:        General: No swelling.  Skin:    General: Skin is warm.  Neurological:     General: No focal deficit present.     Mental Status: He is alert and oriented to person, place, and time.  Psychiatric:        Mood and Affect: Mood normal.        Behavior: Behavior normal.      LABORATORY DATA:  I have reviewed the labs as listed.  CBC    Component Value Date/Time   WBC 10.3 06/22/2019 0805   RBC 3.50 (L) 06/22/2019 0805   HGB 10.9 (L) 06/22/2019 0805   HGB 13.5 05/02/2016 0852   HCT 35.6 (L) 06/22/2019 0805   HCT 41.0 05/02/2016 0852   PLT 254 06/22/2019 0805   PLT 436 (H) 05/02/2016 0852   MCV 101.7 (H) 06/22/2019 0805   MCV 85.7 05/02/2016 0852   MCH 31.1 06/22/2019 0805   MCHC 30.6 06/22/2019 0805   RDW 15.0 06/22/2019 0805   RDW 13.9 05/02/2016 0852   LYMPHSABS 4.1 (H) 06/22/2019 0805   LYMPHSABS 3.4 (H) 05/02/2016 0852   MONOABS 0.9 06/22/2019 0805   MONOABS 1.2 (H) 05/02/2016 0852   EOSABS 0.9 (H) 06/22/2019 0805   EOSABS 1.6 (H) 05/02/2016 0852   BASOSABS 0.1 06/22/2019 0805   BASOSABS 0.2 (H)  05/02/2016 0852   CMP Latest Ref Rng & Units 06/22/2019 05/31/2019 05/10/2019  Glucose 70 - 99 mg/dL 146(H) 146(H) 156(H)  BUN 8 - 23 mg/dL 24(H) 17 16  Creatinine 0.61 - 1.24 mg/dL 1.41(H) 1.41(H) 1.43(H)  Sodium 135 - 145 mmol/L 142 143 142  Potassium 3.5 - 5.1 mmol/L 3.9 4.0 3.5  Chloride 98 - 111 mmol/L 105 105 105  CO2 22 - 32 mmol/L '27 27 24  '$ Calcium 8.9 - 10.3 mg/dL 8.9 9.0 8.9  Total Protein 6.5 - 8.1 g/dL 7.1 7.1 7.6  Total Bilirubin 0.3 - 1.2 mg/dL 0.5 0.4 0.8  Alkaline Phos 38 - 126 U/L 96 86 70  AST 15 - 41 U/L '20 27 26  '$ ALT 0 - 44 U/L '18 21 24       '$ DIAGNOSTIC IMAGING:  I have independently reviewed the scans and discussed with the patient.     ASSESSMENT & PLAN:   Primary cancer of right upper lobe of lung ([place]) 1.  Stage IV adenocarcinoma of the lung, PDL 1-50%, other actionable mutations negative: - Keytruda from 05/17/2016 through 09/24/2018 with mild progression. -CT of the chest on 12/17/2018 shows posterior right upper lobe mass measured 4.1 x 3.2 cm, previously 5.2 x 3.9 cm.  Right paratracheal lymph node decreased from 12 mm to 9 mm.  No new areas were seen.  Right breast nodule also decreased in size. -4 cycles of carboplatin and pemetrexed from 12/25/2018 through 02/23/2019. -Last CT on 03/22/2019 showed right upper lobe lung mass stable measuring 4.9 x 3 cm.  Stable  left adrenal nodule. -Pemetrexed maintenance started on 03/24/2019.  On 06/16/2018 Beryle Flock was started. -He could not receive pemetrexed for the last few time secondary to decreased kidney function. -Today his creatinine is 1.4.  I have reviewed his LFTs which are normal.  He will proceed with pembrolizumab today.  We will hold pemetrexed. -I plan to repeat CT scan of the chest in 3 weeks to evaluate response.  2.  Brain metastasis: -SRS to the right parietal lobe 16.6 x 13.5 x 15.5 mm lesion with surrounding edema on 11/24/2018. -MRI of the brain on 03/11/2019 showed right parietal operculum  metastatic disease smaller, measuring 4.5 mm.  Marked improvement in vasogenic edema. -MRI of the brain on 06/17/2019 reviewed by me shows stable single brain metastasis with new meta stasis.  3.  Leg swelling: -Continue Lasix as needed.  He is not requiring on regular basis.  4.  Hypomagnesemia: -He will continue magnesium twice daily.  Magnesium is normal.  5.  Peripheral neuropathy: -He will continue gabapentin 300 mg 3 times a day.      Orders placed this encounter:  Orders Placed This Encounter  Procedures  . CT Chest W Contrast  . CBC with Differential/Platelet  . Comprehensive metabolic panel      Derek Jack, MD [place] [phone]

## 2019-06-22 NOTE — Assessment & Plan Note (Signed)
1.  Stage IV adenocarcinoma of the lung, PDL 1-50%, other actionable mutations negative: - Keytruda from 05/17/2016 through 09/24/2018 with mild progression. -CT of the chest on 12/17/2018 shows posterior right upper lobe mass measured 4.1 x 3.2 cm, previously 5.2 x 3.9 cm.  Right paratracheal lymph node decreased from 12 mm to 9 mm.  No new areas were seen.  Right breast nodule also decreased in size. -4 cycles of carboplatin and pemetrexed from 12/25/2018 through 02/23/2019. -Last CT on 03/22/2019 showed right upper lobe lung mass stable measuring 4.9 x 3 cm.  Stable left adrenal nodule. -Pemetrexed maintenance started on 03/24/2019.  On 06/16/2018 Beryle Flock was started. -He could not receive pemetrexed for the last few time secondary to decreased kidney function. -Today his creatinine is 1.4.  I have reviewed his LFTs which are normal.  He will proceed with pembrolizumab today.  We will hold pemetrexed. -I plan to repeat CT scan of the chest in 3 weeks to evaluate response.  2.  Brain metastasis: -SRS to the right parietal lobe 16.6 x 13.5 x 15.5 mm lesion with surrounding edema on 11/24/2018. -MRI of the brain on 03/11/2019 showed right parietal operculum metastatic disease smaller, measuring 4.5 mm.  Marked improvement in vasogenic edema. -MRI of the brain on 06/17/2019 reviewed by me shows stable single brain metastasis with new meta stasis.  3.  Leg swelling: -Continue Lasix as needed.  He is not requiring on regular basis.  4.  Hypomagnesemia: -He will continue magnesium twice daily.  Magnesium is normal.  5.  Peripheral neuropathy: -He will continue gabapentin 300 mg 3 times a day.

## 2019-06-22 NOTE — Progress Notes (Signed)
Patient has been assessed, vital signs and labs have been reviewed by Dr. Delton Coombes. ANC, Creatinine, LFTs, and Platelets are within treatment parameters per Dr. Delton Coombes. The patient is good to proceed with treatment at this time. Keytruda only per Dr. Delton Coombes.

## 2019-07-08 ENCOUNTER — Ambulatory Visit (HOSPITAL_COMMUNITY)
Admission: RE | Admit: 2019-07-08 | Discharge: 2019-07-08 | Disposition: A | Discharge status: Home / Self Care | Payer: Medicare Other | Source: Ambulatory Visit | Attending: Hematology | Admitting: Hematology

## 2019-07-08 ENCOUNTER — Other Ambulatory Visit: Payer: Self-pay

## 2019-07-08 DIAGNOSIS — C3411 Malignant neoplasm of upper lobe, right bronchus or lung: Secondary | ICD-10-CM | POA: Diagnosis not present

## 2019-07-08 MED ORDER — IOHEXOL 300 MG/ML  SOLN
75.0000 mL | Freq: Once | INTRAMUSCULAR | Status: AC | PRN
  Administered 2019-07-08: 75 mL via INTRAVENOUS

## 2019-07-13 ENCOUNTER — Other Ambulatory Visit: Payer: Self-pay

## 2019-07-13 ENCOUNTER — Inpatient Hospital Stay (HOSPITAL_COMMUNITY): Payer: Medicare Other

## 2019-07-13 ENCOUNTER — Inpatient Hospital Stay (HOSPITAL_COMMUNITY): Payer: Medicare Other | Attending: Hematology

## 2019-07-13 ENCOUNTER — Inpatient Hospital Stay (HOSPITAL_COMMUNITY): Payer: Medicare Other | Admitting: Hematology

## 2019-07-13 ENCOUNTER — Encounter (HOSPITAL_COMMUNITY): Payer: Self-pay | Admitting: Hematology

## 2019-07-13 VITALS — BP 164/76 | HR 80 | Temp 97.5°F | Resp 18 | Wt 217.5 lb

## 2019-07-13 VITALS — BP 137/65 | HR 74 | Temp 97.5°F | Resp 18

## 2019-07-13 DIAGNOSIS — Z79899 Other long term (current) drug therapy: Secondary | ICD-10-CM | POA: Insufficient documentation

## 2019-07-13 DIAGNOSIS — C3411 Malignant neoplasm of upper lobe, right bronchus or lung: Secondary | ICD-10-CM | POA: Diagnosis not present

## 2019-07-13 DIAGNOSIS — R21 Rash and other nonspecific skin eruption: Secondary | ICD-10-CM | POA: Insufficient documentation

## 2019-07-13 DIAGNOSIS — E1142 Type 2 diabetes mellitus with diabetic polyneuropathy: Secondary | ICD-10-CM | POA: Diagnosis not present

## 2019-07-13 DIAGNOSIS — C7931 Secondary malignant neoplasm of brain: Secondary | ICD-10-CM | POA: Insufficient documentation

## 2019-07-13 DIAGNOSIS — Z7984 Long term (current) use of oral hypoglycemic drugs: Secondary | ICD-10-CM | POA: Insufficient documentation

## 2019-07-13 DIAGNOSIS — Z87891 Personal history of nicotine dependence: Secondary | ICD-10-CM | POA: Diagnosis not present

## 2019-07-13 DIAGNOSIS — R05 Cough: Secondary | ICD-10-CM | POA: Diagnosis not present

## 2019-07-13 LAB — CBC WITH DIFFERENTIAL/PLATELET
Abs Immature Granulocytes: 0.03 10*3/uL (ref 0.00–0.07)
Basophils Absolute: 0.1 10*3/uL (ref 0.0–0.1)
Basophils Relative: 1 %
Eosinophils Absolute: 0.7 10*3/uL — ABNORMAL HIGH (ref 0.0–0.5)
Eosinophils Relative: 8 %
HCT: 38.4 % — ABNORMAL LOW (ref 39.0–52.0)
Hemoglobin: 12 g/dL — ABNORMAL LOW (ref 13.0–17.0)
Immature Granulocytes: 0 %
Lymphocytes Relative: 41 %
Lymphs Abs: 4 10*3/uL (ref 0.7–4.0)
MCH: 31.3 pg (ref 26.0–34.0)
MCHC: 31.3 g/dL (ref 30.0–36.0)
MCV: 100 fL (ref 80.0–100.0)
Monocytes Absolute: 0.9 10*3/uL (ref 0.1–1.0)
Monocytes Relative: 9 %
Neutro Abs: 4 10*3/uL (ref 1.7–7.7)
Neutrophils Relative %: 41 %
Platelets: 223 10*3/uL (ref 150–400)
RBC: 3.84 MIL/uL — ABNORMAL LOW (ref 4.22–5.81)
RDW: 13.8 % (ref 11.5–15.5)
WBC: 9.7 10*3/uL (ref 4.0–10.5)
nRBC: 0 % (ref 0.0–0.2)

## 2019-07-13 LAB — COMPREHENSIVE METABOLIC PANEL
ALT: 9 U/L (ref 0–44)
AST: 17 U/L (ref 15–41)
Albumin: 4 g/dL (ref 3.5–5.0)
Alkaline Phosphatase: 103 U/L (ref 38–126)
Anion gap: 10 (ref 5–15)
BUN: 27 mg/dL — ABNORMAL HIGH (ref 8–23)
CO2: 28 mmol/L (ref 22–32)
Calcium: 9 mg/dL (ref 8.9–10.3)
Chloride: 103 mmol/L (ref 98–111)
Creatinine, Ser: 1.39 mg/dL — ABNORMAL HIGH (ref 0.61–1.24)
GFR calc Af Amer: 57 mL/min — ABNORMAL LOW (ref >60)
GFR calc non Af Amer: 50 mL/min — ABNORMAL LOW (ref >60)
Glucose, Bld: 169 mg/dL — ABNORMAL HIGH (ref 70–99)
Potassium: 3.9 mmol/L (ref 3.5–5.1)
Sodium: 141 mmol/L (ref 135–145)
Total Bilirubin: 0.6 mg/dL (ref 0.3–1.2)
Total Protein: 7.3 g/dL (ref 6.5–8.1)

## 2019-07-13 LAB — TSH: TSH: 3.381 u[IU]/mL (ref 0.350–4.500)

## 2019-07-13 MED ORDER — HEPARIN SOD (PORK) LOCK FLUSH 100 UNIT/ML IV SOLN
500.0000 [IU] | Freq: Once | INTRAVENOUS | Status: AC | PRN
  Administered 2019-07-13: 500 [IU]

## 2019-07-13 MED ORDER — SODIUM CHLORIDE 0.9 % IV SOLN
2.0000 mg/kg | Freq: Once | INTRAVENOUS | Status: AC
  Administered 2019-07-13: 11:00:00 200 mg via INTRAVENOUS
  Filled 2019-07-13: qty 8

## 2019-07-13 MED ORDER — SODIUM CHLORIDE 0.9% FLUSH
10.0000 mL | INTRAVENOUS | Status: DC | PRN
  Administered 2019-07-13: 12:00:00 10 mL

## 2019-07-13 MED ORDER — SODIUM CHLORIDE 0.9 % IV SOLN: Freq: Once | INTRAVENOUS | Status: AC

## 2019-07-13 NOTE — Patient Instructions (Signed)
[facility] Discharge Instructions for Patients Receiving Chemotherapy  Today you received the following chemotherapy agents   To help prevent nausea and vomiting after your treatment, we encourage you to take your nausea medication   If you develop nausea and vomiting that is not controlled by your nausea medication, call the clinic.   BELOW ARE SYMPTOMS THAT SHOULD BE REPORTED IMMEDIATELY:  *FEVER GREATER THAN 100.5 F  *CHILLS WITH OR WITHOUT FEVER  NAUSEA AND VOMITING THAT IS NOT CONTROLLED WITH YOUR NAUSEA MEDICATION  *UNUSUAL SHORTNESS OF BREATH  *UNUSUAL BRUISING OR BLEEDING  TENDERNESS IN MOUTH AND THROAT WITH OR WITHOUT PRESENCE OF ULCERS  *URINARY PROBLEMS  *BOWEL PROBLEMS  UNUSUAL RASH Items with * indicate a potential emergency and should be followed up as soon as possible.  Feel free to call the clinic should you have any questions or concerns. The clinic phone number is [phone].  Please show the CHEMO ALERT CARD at check-in to the Emergency Department and triage nurse.   

## 2019-07-13 NOTE — Progress Notes (Signed)
Patient has been assessed, vital signs and labs have been reviewed by Dr. Katragadda. ANC, Creatinine, LFTs, and Platelets are within treatment parameters per Dr. Katragadda. The patient is good to proceed with treatment at this time.  

## 2019-07-13 NOTE — Patient Instructions (Addendum)
[place] at [facility] Discharge Instructions  You were seen today by Dr. Delton Coombes. He went over your recent lab results. He will see you back in 3 weeks for labs, treatment and follow up.   Thank you for choosing  at [facility] to provide your oncology and hematology care.  To afford each patient quality time with our provider, please arrive at least 15 minutes before your scheduled appointment time.   If you have a lab appointment with the [facility] please come in thru the  Main Entrance and check in at the main information desk  You need to re-schedule your appointment should you arrive 10 or more minutes late.  We strive to give you quality time with our providers, and arriving late affects you and other patients whose appointments are after yours.  Also, if you no show three or more times for appointments you may be dismissed from the clinic at the providers discretion.     Again, thank you for choosing [facility].  Our hope is that these requests will decrease the amount of time that you wait before being seen by our physicians.       _____________________________________________________________  Should you have questions after your visit to [facility], please contact our office at [phone] between the hours of 8:00 a.m. and 4:30 p.m.  Voicemails left after 4:00 p.m. will not be returned until the following business day.  For prescription refill requests, have your pharmacy contact our office and allow 72 hours.    Cancer Center Support Programs:   > Cancer Support Group  2nd Tuesday of the month 1pm-2pm, Journey Room

## 2019-07-13 NOTE — Progress Notes (Signed)
Patient presents today for follow up visit and treatment. Vital signs within parameters for treatment. Labs pending.    Message received from [place] / Dr. Delton Coombes. Proceed with treatment.  Creatinine today 1.39. MD aware. Labs within parameters for treatment today.   05-31-19 last B12 injection with Cycle 8. B12 inection to be administered every 3rd cycle.   Treatment given today per MD orders. Tolerated infusion without adverse affects. Vital signs stable. No complaints at this time. Discharged from clinic ambulatory. F/U with [facility] as scheduled.

## 2019-07-13 NOTE — Progress Notes (Signed)
Darrell Christensen, [place] [id number]   CLINIC:  Medical Oncology/Hematology  PCP:  Celene Squibb, MD [place] [address] [phone]   REASON FOR VISIT:  Follow-up for stage IV adenocarcinoma of the right lung   BRIEF ONCOLOGIC HISTORY:  Oncology History  Primary cancer of right upper lobe of lung ([place])  05/14/2015 Procedure   Port placement by Dr. Arnoldo Morale   04/01/2016 Initial Diagnosis   Primary cancer of right upper lobe of lung ([place])   04/02/2016 Procedure   Ultrasound-guided core biopsy performed of a solid 1.5 cm soft tissue nodule in the right chest wall.   04/03/2016 Imaging   MRI brain- No acute and intracranial process or metastasis.  Moderate chronic small vessel ischemic disease.   04/04/2016 Pathology Results   Diagnosis Soft Tissue Needle Core Biopsy, Right Chest Wall SMOOTH MUSCLE NEOPLASM Microscopic Comment The differential diagnosis include low grade leiomyosarcoma. The neoplasm shows spindle cell morphology with rare mitotic figures and minimal atypia, no necrosis present. These cells stains positive for smooth muscle actin, desmin, negative for cytokeratin AE1&3, ck8/18, s100, cd117, cd 34 and cd99. This case also reviewed by Dr. [name] and agree.   04/05/2016 Procedure   CT-guided biopsy of right upper lobe lesion, with tissue specimen sent to pathology, by IR.   04/09/2016 Pathology Results   Lung, needle/core biopsy(ies), Right Upper Lobe - NON SMALL CELL CARCINOMA.   04/17/2016 Pathology Results   PDL1 High Expression.  TPS 50%.   04/24/2016 PET scan   1. Prominently hypermetabolic large right upper lobe mass with metastatic disease to the right paratracheal and right hilar nodal chains, to the descending mesocolon, to the left upper buttock subcutaneous tissues, to the left upper lobe, to the right subscapularis muscle, and possibly to the left adrenal gland and right breast  subcutaneous tissues. Appearance compatible with stage IV lung cancer. 2. There is some focal high activity in the ascending colon which is probably from peristalsis, less likely from a local colon mass. This does raise the possibility that the adjacent mesocolon tumor implant could be due to synchronous colon cancer rather than lung metastasis. 3. Other imaging findings of potential clinical significance: Coronary, aortic arch, and branch vessel atherosclerotic vascular disease. Aortoiliac atherosclerotic vascular disease. Deformity left proximal humerus and left glenohumeral joint from prior trauma. Enlarged prostate gland.   04/26/2016 Pathology Results   FoundationONE: Genomic alterations identified- KRAS X83A, CREBBP splice site [id number], LRP1B S515*, AYO45 T97*, FSF42 splice site [id number], SPTA1 splice site [id number]>[id number], TP53 C135Y.  Additional findings- MS-Stable, TMB-intermediate (14 Muts/Mb).  No reportable alterations- EGFR, ALK, BRAF, MET, ERBB2, RET, ROS1.   05/17/2016 -  Chemotherapy   The patient had ONDANSETRON IVPB [facility] +/- DEXAMETHASONE, , Intravenous,  Once, 0 of 1 cycle  pembrolizumab (KEYTRUDA) 200 mg in sodium chloride 0.9 % 50 mL chemo infusion, 200 mg, Intravenous, Once, 0 of 5 cycles  for chemotherapy treatment.     08/06/2016 PET scan   IMPRESSION: 1. Overall considerable improvement. The right upper lobe mass is markedly reduced in volume and SUV. Thoracic adenopathy have resolved and the hypermetabolic lesion in the right subscapularis muscle has also resolved. 2. The left upper lobe nodule is no longer appreciably hypermetabolic, and is highly indistinct although this may be due to motion artifact. Faint in indistinct nodularity elsewhere in the lungs likewise not currently hypermetabolic. 3. There is some very faint residual low-grade activity along the subcutaneous deposit  along the upper left buttock region. Marked reduction in size and activity of  the tumor deposit adjacent to the descending colon. 4. Prior right pleural effusion has resolved. 5. Stable appearance of the adrenal glands, with low-level activity and a nodule in the left adrenal gland. 6. Stable small nodule in the right breast, low-grade metabolic activity with maximum SUV 2.2 (formerly 3.0) 7. Other imaging findings of potential clinical significance: Severe arthropathy of the left glenohumeral joint. Coronary, aortic arch, and branch vessel atherosclerotic vascular disease. Aortoiliac atherosclerotic vascular disease. Prominent prostate gland indents the bladder base.    12/16/2018 -  Chemotherapy   The patient had palonosetron (ALOXI) injection 0.25 mg, 0.25 mg, Intravenous,  Once, 4 of 4 cycles Administration: 0.25 mg (12/16/2018), 0.25 mg (01/27/2019), 0.25 mg (03/03/2019), 0.25 mg (01/06/2019) pegfilgrastim (NEULASTA) injection 6 mg, 6 mg, Subcutaneous, Once, 2 of 2 cycles Administration: 6 mg (01/08/2019) pegfilgrastim (NEULASTA ONPRO KIT) injection 6 mg, 6 mg, Subcutaneous, Once, 2 of 2 cycles pegfilgrastim-cbqv (UDENYCA) injection 6 mg, 6 mg, Subcutaneous, Once, 2 of 2 cycles Administration: 6 mg (01/29/2019), 6 mg (03/05/2019) ondansetron (ZOFRAN) 8 mg in sodium chloride 0.9 % 50 mL IVPB, 8 mg (100 % of original dose 8 mg), Intravenous,  Once, 3 of 3 cycles Dose modification: 8 mg (original dose 8 mg, Cycle 5) Administration: 8 mg (03/24/2019), 8 mg (04/16/2019) PEMEtrexed (ALIMTA) 1,100 mg in sodium chloride 0.9 % 100 mL chemo infusion, 490 mg/m2 = 1,125 mg, Intravenous,  Once, 6 of 6 cycles Dose modification: 400 mg/m2 (80 % of original dose 500 mg/m2, Cycle 3, Reason: Other (see comments), Comment: rash, swelling) Administration: 1,100 mg (12/16/2018), 900 mg (01/27/2019), 900 mg (03/03/2019), 900 mg (03/24/2019), 900 mg (04/16/2019), 1,100 mg (01/06/2019) CARBOplatin (PARAPLATIN) 500 mg in sodium chloride 0.9 % 250 mL chemo infusion, 530 mg (110.7 % of original  dose 477.5 mg), Intravenous,  Once, 4 of 4 cycles Dose modification:   (original dose 477.5 mg, Cycle 1),   (original dose 485.5 mg, Cycle 3),   (original dose 494 mg, Cycle 4),   (original dose 494 mg, Cycle 2) Administration: 500 mg (12/16/2018), 490 mg (01/27/2019), 490 mg (03/03/2019), 490 mg (01/06/2019) pembrolizumab (KEYTRUDA) 200 mg in sodium chloride 0.9 % 50 mL chemo infusion, 2 mg/kg = 200 mg, Intravenous, Once, 5 of 7 cycles Administration: 200 mg (04/16/2019), 200 mg (05/31/2019), 200 mg (06/22/2019), 200 mg (07/13/2019), 200 mg (05/10/2019) fosaprepitant (EMEND) 150 mg, dexamethasone (DECADRON) 12 mg in sodium chloride 0.9 % 145 mL IVPB, , Intravenous,  Once, 4 of 4 cycles Administration:  (12/16/2018),  (01/27/2019),  (03/03/2019),  (01/06/2019)  for chemotherapy treatment.    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  Two adjacent large masslike areas of consolidation within the right upper lobe with differential considerations including malignancy or pneumonia. Multiple enlarged right hilar and mediastinal lymph nodes which may be reactive or metastatic in etiology.  Additional indeterminate pulmonary nodules as above. Recommend attention on follow-up.  Indeterminate 2.9 cm left adrenal nodule. This needs dedicated evaluation with pre and post contrast-enhanced CT or MRI after confirmation of pulmonary process.  Indeterminate nodule within the right breast. Recommend dedicated evaluation with mammography.  Hepatic steatosis.   04/02/2016 Procedure   Ultrasound-guided core biopsy performed of a solid 1.5 cm soft tissue nodule in the right chest wall.   04/04/2016 Pathology Results   Diagnosis  Soft Tissue Needle Core Biopsy, Right Chest Wall SMOOTH MUSCLE NEOPLASM Microscopic Comment The differential diagnosis include low grade  leiomyosarcoma. The neoplasm shows spindle cell morphology with rare mitotic figures and minimal atypia, no necrosis present. These cells stains positive for smooth muscle actin, desmin, negative for cytokeratin AE1&3, ck8/18, s100, cd117, cd 34 and cd99. This case also reviewed by Dr. [name] and agree.       CANCER STAGING: Cancer Staging Primary cancer of right upper lobe of lung ([place]) Staging form: Lung, AJCC 8th Edition - Clinical: Stage IVB (cT3, cN2, cM1c) - Signed by Baird Cancer, PA-C on 05/07/2016    INTERVAL HISTORY:  Mr. Reindl 74 y.o. male seen for follow-up of stage IV adenocarcinoma lung nontoxic [place] prior to next cycle of immunotherapy.  Reported some itching of the skin on the lower legs.  Appetite is 100%.  Energy levels are 25%.  Also reported some cough with clear expectoration.Pins-and-needles in the legs present.  He is currently taking gabapentin which is not clearly helping.  He denies any fevers or chills.  REVIEW OF SYSTEMS:  Review of Systems  Respiratory: Positive for cough and shortness of breath.   Skin: Positive for itching.  Neurological: Positive for numbness.  All other systems reviewed and are negative.    PAST MEDICAL/SURGICAL HISTORY:  Past Medical History:  Diagnosis Date  . Adrenal mass, left ([place]) 04/01/2016  . Diabetes mellitus ([place]) 05/02/2016  . Diabetes mellitus without complication ([place])   . Hypertension   . Mass of breast, right 04/01/2016  . Mass of upper lobe of right lung 04/01/2016   2 adjacent, 5 cm masses, hilar adenopathy  . Primary cancer of right upper lobe of lung ([place]) 04/01/2016   2 adjacent, 5 cm masses, hilar adenopathy  . Pulmonary embolus ([place]) 03/31/2016  . 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 COLONOSCOPY N/A 07/28/2017   Procedure: COLONOSCOPY;  Surgeon: Rogene Houston, MD;  Location: [facility] ENDO SUITE;  Service: Endoscopy;  Laterality: N/A;  205  . PORTACATH  PLACEMENT Left 05/13/2016   Procedure: INSERTION PORT-A-CATH;  Surgeon: Aviva Signs, MD;  Location: [facility] ORS;  Service: General;  Laterality: Left;     SOCIAL HISTORY:  Social History   Socioeconomic History  . Marital status: Divorced    Spouse name: Not on file  . Number of children: Not on file  . Years of education: Not on file  . Highest education level: Not on file  Occupational History  . Not on file  Tobacco Use  . Smoking status: Former Smoker    Years: 61.00    Quit date: 04/11/2014    Years since quitting: 5.2  . Smokeless tobacco: Never Used  . Tobacco comment: patient does vape smoking x 2 years  Substance and Sexual Activity  . Alcohol use: Not Currently    Comment: quit 10 years  . Drug use: No  . Sexual activity: Not on file  Other Topics Concern  . Not on file  Social History Narrative  . Not on file   Social Determinants of Health   Financial Resource Strain:   . Difficulty of Paying Living Expenses: Not on file  Food Insecurity:   . Worried About Charity fundraiser in the Last Year: Not on file  . Ran Out of Food in the Last Year: Not on file  Transportation Needs: No Transportation Needs  . Lack of Transportation (Medical): No  .  Lack of Transportation (Non-Medical): No  Physical Activity:   . Days of Exercise per Week: Not on file  . Minutes of Exercise per Session: Not on file  Stress:   . Feeling of Stress : Not on file  Social Connections:   . Frequency of Communication with Friends and Family: Not on file  . Frequency of Social Gatherings with Friends and Family: Not on file  . Attends Religious Services: Not on file  . Active Member of Clubs or Organizations: Not on file  . Attends Archivist Meetings: Not on file  . Marital Status: Not on file  Intimate Partner Violence:   . Fear of Current or Ex-Partner: Not on file  . Emotionally Abused: Not on file  . Physically Abused: Not on file  . Sexually Abused: Not on file     FAMILY HISTORY:  Family History  Problem Relation Age of Onset  . Stroke Mother   . Heart attack Father   . Heart attack Brother     CURRENT MEDICATIONS:  Outpatient Encounter Medications as of 07/13/2019  Medication Sig  . ALPRAZolam (XANAX) 1 MG tablet Take 1 mg by mouth 3 (three) times daily as needed for anxiety.  Marland Kitchen amLODipine (NORVASC) 2.5 MG tablet Take 2.5 mg by mouth daily.  Marland Kitchen CARBOPLATIN IV Inject into the vein every 21 ( twenty-one) days.  . folic acid (FOLVITE) 1 MG tablet Take 1 tablet (1 mg total) by mouth daily. Start 5-7 days before Alimta chemotherapy. Continue until 21 days after Alimta completed.  . furosemide (LASIX) 20 MG tablet Take 2 tablets (40 mg total) by mouth daily.  Marland Kitchen gabapentin (NEURONTIN) 400 MG capsule Take 1 capsule (400 mg total) by mouth 3 (three) times daily.  Marland Kitchen GLIPIZIDE XL 5 MG 24 hr tablet Take 5 mg by mouth daily.  . hydrocortisone 2.5 % lotion Apply topically as needed.   . lidocaine-prilocaine (EMLA) cream APPLY A QUARTER SIZE AMOUNT TO THE AFFECTED AREA 1 HOUR PRIOR TO COMING TO CHEMOTHERAPY. COVER WITH A PLASTIC WRAP.  . magnesium oxide (MAG-OX) 400 (241.3 Mg) MG tablet Take 1 tablet (400 mg total) by mouth 2 (two) times daily.  . metFORMIN (GLUCOPHAGE) 500 MG tablet Take 500 mg by mouth 2 (two) times daily.  . metoprolol succinate (TOPROL-XL) 25 MG 24 hr tablet TAKE ONE TABLET BY MOUTH IN THE EVENING.  Marland Kitchen ondansetron (ZOFRAN) 4 MG tablet Take 1 tablet (4 mg total) by mouth every 8 (eight) hours as needed for nausea or vomiting.  Marland Kitchen oxyCODONE-acetaminophen (PERCOCET) 10-325 MG tablet Take 1 tablet by mouth as needed.   . Pembrolizumab (KEYTRUDA IV) Inject into the vein.  Marland Kitchen PEMEtrexed 500 mg/m2 in sodium chloride 0.9 % 100 mL Inject into the vein every 21 ( twenty-one) days.  . potassium chloride (K-DUR,KLOR-CON) 10 MEQ tablet Take 10 mEq by mouth daily.   . prochlorperazine (COMPAZINE) 10 MG tablet Take 1 tablet (10 mg total) by mouth every 6  (six) hours as needed (Nausea or vomiting).  . simvastatin (ZOCOR) 20 MG tablet Take 20 mg by mouth every evening.  . torsemide (DEMADEX) 20 MG tablet Take 1 tablet (20 mg total) by mouth daily.  . [DISCONTINUED] gabapentin (NEURONTIN) 300 MG capsule Take 1 capsule (300 mg total) by mouth 3 (three) times daily.   No facility-administered encounter medications on file as of 07/13/2019.    ALLERGIES:  No Known Allergies   PHYSICAL EXAM:  ECOG Performance status: 1  Vitals:  07/13/19 0800  BP: (!) 164/76  Pulse: 80  Resp: 18  Temp: (!) 97.5 F (36.4 C)  SpO2: 95%   Filed Weights   07/13/19 0800  Weight: 217 lb 8 oz (98.7 kg)    Physical Exam Vitals reviewed.  Constitutional:      Appearance: Normal appearance.  Cardiovascular:     Rate and Rhythm: Normal rate and regular rhythm.     Heart sounds: Normal heart sounds.  Pulmonary:     Effort: Pulmonary effort is normal.     Breath sounds: Normal breath sounds.  Abdominal:     General: There is no distension.     Palpations: Abdomen is soft. There is no mass.  Musculoskeletal:        General: No swelling.  Skin:    General: Skin is warm.  Neurological:     General: No focal deficit present.     Mental Status: He is alert and oriented to person, place, and time.  Psychiatric:        Mood and Affect: Mood normal.        Behavior: Behavior normal.      LABORATORY DATA:  I have reviewed the labs as listed.  CBC    Component Value Date/Time   WBC 9.7 07/13/2019 0813   RBC 3.84 (L) 07/13/2019 0813   HGB 12.0 (L) 07/13/2019 0813   HGB 13.5 05/02/2016 0852   HCT 38.4 (L) 07/13/2019 0813   HCT 41.0 05/02/2016 0852   PLT 223 07/13/2019 0813   PLT 436 (H) 05/02/2016 0852   MCV 100.0 07/13/2019 0813   MCV 85.7 05/02/2016 0852   MCH 31.3 07/13/2019 0813   MCHC 31.3 07/13/2019 0813   RDW 13.8 07/13/2019 0813   RDW 13.9 05/02/2016 0852   LYMPHSABS 4.0 07/13/2019 0813   LYMPHSABS 3.4 (H) 05/02/2016 0852   MONOABS  0.9 07/13/2019 0813   MONOABS 1.2 (H) 05/02/2016 0852   EOSABS 0.7 (H) 07/13/2019 0813   EOSABS 1.6 (H) 05/02/2016 0852   BASOSABS 0.1 07/13/2019 0813   BASOSABS 0.2 (H) 05/02/2016 0852   CMP Latest Ref Rng & Units 07/13/2019 06/22/2019 05/31/2019  Glucose 70 - 99 mg/dL 169(H) 146(H) 146(H)  BUN 8 - 23 mg/dL 27(H) 24(H) 17  Creatinine 0.61 - 1.24 mg/dL 1.39(H) 1.41(H) 1.41(H)  Sodium 135 - 145 mmol/L 141 142 143  Potassium 3.5 - 5.1 mmol/L 3.9 3.9 4.0  Chloride 98 - 111 mmol/L 103 105 105  CO2 22 - 32 mmol/L '28 27 27  '$ Calcium 8.9 - 10.3 mg/dL 9.0 8.9 9.0  Total Protein 6.5 - 8.1 g/dL 7.3 7.1 7.1  Total Bilirubin 0.3 - 1.2 mg/dL 0.6 0.5 0.4  Alkaline Phos 38 - 126 U/L 103 96 86  AST 15 - 41 U/L '17 20 27  '$ ALT 0 - 44 U/L '9 18 21       '$ DIAGNOSTIC IMAGING:  I have independently reviewed scans.     ASSESSMENT & PLAN:   Primary cancer of right upper lobe of lung ([place]) 1.  Stage IV adenocarcinoma of the lung, PD-L1 50%, other actionable mutations negative: -Keytruda from 05/17/2016 through 09/24/2018 with mild progression. -4 cycles of carboplatin and pemetrexed from 12/25/2018 through 02/23/2019. -Pemetrexed maintenance started on 03/24/2019, last treatment on 04/12/2019, discontinued secondary to renal insufficiency. -He has been tolerating Keytruda reasonably well. -We reviewed results of the CT chest dated 07/08/2019.  Slight interval decrease in the right upper lobe mass along the major fissure.  Stable appearance  of small mediastinal lymph nodes.  Stable left adrenal mass.  Stable dilatation of the ascending thoracic aorta.  Stable findings of chronic interstitial lung disease, particularly prominent in the bases.  No infiltrate seen. -We have reviewed his labs.  Hemoglobin has improved.  White count and platelets are normal to proceed with his next treatment today.  2.  Brain metastasis: -SRS to the right parietal lobe 16.6 x 13.5 x 15.5 mm lesion with surrounding edema on  11/24/2018. -MRI of the brain on 03/11/2019 showed right parietal operculum metastatic disease smaller, measuring 4.5 mm.  Marked improvement in vasogenic edema. -MRI of the brain on 06/17/2019 reviewed by me shows stable single brain metastasis with no new meta stasis.  3.  Cough with clear sputum: -He reports having some cough over the last 2 to 3 weeks.  Sputum is clear. -I have suggested him to take Mucinex twice daily.  4.  Itching rash: -He reports itching rash on the lower part of the legs.  He scratched intensely at nighttime and created a skin breakdown on the right lateral leg. -He was told to apply moisturizing lotion to avoid dry skin.  He was also told to use steroid cream over the rash.  Likely immunotherapy related dry skin and some rash.  He was told to use antibacterial cream over the skin breakdown area on the right leg.  5.  Peripheral neuropathy: -He is currently taking gabapentin 300 mg 3 times a day.  This is poorly controlled. -I will increase it to 400 mg 3 times a day.  6.  Hypomagnesemia: -He will continue magnesium twice daily.  Last magnesium was normal.      Orders placed this encounter:  Orders Placed This Encounter  Procedures  . CBC with Differential/Platelet  . Comprehensive metabolic panel  . Magnesium      Derek Jack, MD [place] [phone]

## 2019-07-14 MED ORDER — GABAPENTIN 400 MG PO CAPS: 400.0000 mg | ORAL_CAPSULE | Freq: Three times a day (TID) | ORAL | 3 refills | Status: DC

## 2019-07-14 NOTE — Assessment & Plan Note (Signed)
1.  Stage IV adenocarcinoma of the lung, PD-L1 50%, other actionable mutations negative: -Keytruda from 05/17/2016 through 09/24/2018 with mild progression. -4 cycles of carboplatin and pemetrexed from 12/25/2018 through 02/23/2019. -Pemetrexed maintenance started on 03/24/2019, last treatment on 04/12/2019, discontinued secondary to renal insufficiency. -He has been tolerating Keytruda reasonably well. -We reviewed results of the CT chest dated 07/08/2019.  Slight interval decrease in the right upper lobe mass along the major fissure.  Stable appearance of small mediastinal lymph nodes.  Stable left adrenal mass.  Stable dilatation of the ascending thoracic aorta.  Stable findings of chronic interstitial lung disease, particularly prominent in the bases.  No infiltrate seen. -We have reviewed his labs.  Hemoglobin has improved.  White count and platelets are normal to proceed with his next treatment today.  2.  Brain metastasis: -SRS to the right parietal lobe 16.6 x 13.5 x 15.5 mm lesion with surrounding edema on 11/24/2018. -MRI of the brain on 03/11/2019 showed right parietal operculum metastatic disease smaller, measuring 4.5 mm.  Marked improvement in vasogenic edema. -MRI of the brain on 06/17/2019 reviewed by me shows stable single brain metastasis with no new meta stasis.  3.  Cough with clear sputum: -He reports having some cough over the last 2 to 3 weeks.  Sputum is clear. -I have suggested him to take Mucinex twice daily.  4.  Itching rash: -He reports itching rash on the lower part of the legs.  He scratched intensely at nighttime and created a skin breakdown on the right lateral leg. -He was told to apply moisturizing lotion to avoid dry skin.  He was also told to use steroid cream over the rash.  Likely immunotherapy related dry skin and some rash.  He was told to use antibacterial cream over the skin breakdown area on the right leg.  5.  Peripheral neuropathy: -He is currently taking  gabapentin 300 mg 3 times a day.  This is poorly controlled. -I will increase it to 400 mg 3 times a day.  6.  Hypomagnesemia: -He will continue magnesium twice daily.  Last magnesium was normal.

## 2019-07-16 ENCOUNTER — Ambulatory Visit (HOSPITAL_COMMUNITY): Payer: Medicare Other

## 2019-07-16 ENCOUNTER — Encounter (HOSPITAL_COMMUNITY): Payer: Medicare Other

## 2019-07-16 NOTE — Progress Notes (Signed)
Nutrition Follow-up:  Patient with stage IV adenocarcinoma of lung.  Patient receiving Eugenie Filler with patient via phone for nutrition follow-up.  Patient reports appetite is good.  Reports eating 2 big meals per day, usually breakfast and supper.  Most of meals are out to eat.  Eating good sources of protein, vegetables.    Denies nutrition impact symptoms.  Medications: reviewed  Labs: reviewed  Anthropometrics:   Weight 217 lb on 3/9 increased from 205 lb on 1/25.  Denies swelling   NUTRITION DIAGNOSIS: Inadequate oral intake improved   INTERVENTION:  Encouraged patient to continue to eat well balanced diet including good sources of protein.   Patient has contact information and encouraged to reach out to RD if appetite or weight changes    MONITORING, EVALUATION, GOAL: Patient will consume adequate calories and protein to maintain weight and lean muscle mass   NEXT VISIT: as needed  Darrell Christensen B. Zenia Resides, [place], [place] Registered Dietitian [phone] (pager)

## 2019-07-28 DIAGNOSIS — C3411 Malignant neoplasm of upper lobe, right bronchus or lung: Secondary | ICD-10-CM | POA: Diagnosis not present

## 2019-07-28 DIAGNOSIS — C7989 Secondary malignant neoplasm of other specified sites: Secondary | ICD-10-CM | POA: Diagnosis not present

## 2019-07-28 DIAGNOSIS — E1165 Type 2 diabetes mellitus with hyperglycemia: Secondary | ICD-10-CM | POA: Diagnosis not present

## 2019-07-28 DIAGNOSIS — D72829 Elevated white blood cell count, unspecified: Secondary | ICD-10-CM | POA: Diagnosis not present

## 2019-07-29 NOTE — Progress Notes (Signed)
.  Pharmacist Chemotherapy Monitoring - Follow Up Assessment    I verify that I have reviewed each item in the below checklist:  . Regimen for the patient is scheduled for the appropriate day and plan matches scheduled date. Marland Kitchen Appropriate non-routine labs are ordered dependent on drug ordered. . If applicable, additional medications reviewed and ordered per protocol based on lifetime cumulative doses and/or treatment regimen.   Plan for follow-up and/or issues identified: No . I-vent associated with next due treatment: No . MD and/or nursing notified: No  Darrell Christensen 07/29/2019 3:56 PM

## 2019-08-02 DIAGNOSIS — E782 Mixed hyperlipidemia: Secondary | ICD-10-CM | POA: Diagnosis not present

## 2019-08-02 DIAGNOSIS — I10 Essential (primary) hypertension: Secondary | ICD-10-CM | POA: Diagnosis not present

## 2019-08-02 DIAGNOSIS — C3411 Malignant neoplasm of upper lobe, right bronchus or lung: Secondary | ICD-10-CM | POA: Diagnosis not present

## 2019-08-02 DIAGNOSIS — E1165 Type 2 diabetes mellitus with hyperglycemia: Secondary | ICD-10-CM | POA: Diagnosis not present

## 2019-08-03 ENCOUNTER — Other Ambulatory Visit: Payer: Self-pay

## 2019-08-03 ENCOUNTER — Inpatient Hospital Stay (HOSPITAL_COMMUNITY): Payer: Medicare Other

## 2019-08-03 ENCOUNTER — Inpatient Hospital Stay (HOSPITAL_COMMUNITY): Payer: Medicare Other | Admitting: Hematology

## 2019-08-03 ENCOUNTER — Encounter (HOSPITAL_COMMUNITY): Payer: Self-pay | Admitting: Hematology

## 2019-08-03 VITALS — BP 133/69 | HR 71 | Temp 97.3°F | Resp 16

## 2019-08-03 DIAGNOSIS — R21 Rash and other nonspecific skin eruption: Secondary | ICD-10-CM | POA: Diagnosis not present

## 2019-08-03 DIAGNOSIS — Z87891 Personal history of nicotine dependence: Secondary | ICD-10-CM | POA: Diagnosis not present

## 2019-08-03 DIAGNOSIS — C3411 Malignant neoplasm of upper lobe, right bronchus or lung: Secondary | ICD-10-CM

## 2019-08-03 DIAGNOSIS — E1142 Type 2 diabetes mellitus with diabetic polyneuropathy: Secondary | ICD-10-CM | POA: Diagnosis not present

## 2019-08-03 DIAGNOSIS — C7931 Secondary malignant neoplasm of brain: Secondary | ICD-10-CM | POA: Diagnosis not present

## 2019-08-03 DIAGNOSIS — Z7984 Long term (current) use of oral hypoglycemic drugs: Secondary | ICD-10-CM | POA: Diagnosis not present

## 2019-08-03 DIAGNOSIS — Z79899 Other long term (current) drug therapy: Secondary | ICD-10-CM | POA: Diagnosis not present

## 2019-08-03 DIAGNOSIS — R05 Cough: Secondary | ICD-10-CM | POA: Diagnosis not present

## 2019-08-03 LAB — COMPREHENSIVE METABOLIC PANEL
ALT: 12 U/L (ref 0–44)
AST: 14 U/L — ABNORMAL LOW (ref 15–41)
Albumin: 3.8 g/dL (ref 3.5–5.0)
Alkaline Phosphatase: 87 U/L (ref 38–126)
Anion gap: 10 (ref 5–15)
BUN: 18 mg/dL (ref 8–23)
CO2: 28 mmol/L (ref 22–32)
Calcium: 9 mg/dL (ref 8.9–10.3)
Chloride: 103 mmol/L (ref 98–111)
Creatinine, Ser: 1.49 mg/dL — ABNORMAL HIGH (ref 0.61–1.24)
GFR calc Af Amer: 53 mL/min — ABNORMAL LOW (ref >60)
GFR calc non Af Amer: 46 mL/min — ABNORMAL LOW (ref >60)
Glucose, Bld: 130 mg/dL — ABNORMAL HIGH (ref 70–99)
Potassium: 4.5 mmol/L (ref 3.5–5.1)
Sodium: 141 mmol/L (ref 135–145)
Total Bilirubin: 0.4 mg/dL (ref 0.3–1.2)
Total Protein: 7 g/dL (ref 6.5–8.1)

## 2019-08-03 LAB — CBC WITH DIFFERENTIAL/PLATELET
Abs Immature Granulocytes: 0.02 10*3/uL (ref 0.00–0.07)
Basophils Absolute: 0.1 10*3/uL (ref 0.0–0.1)
Basophils Relative: 1 %
Eosinophils Absolute: 0.8 10*3/uL — ABNORMAL HIGH (ref 0.0–0.5)
Eosinophils Relative: 8 %
HCT: 39.5 % (ref 39.0–52.0)
Hemoglobin: 12.3 g/dL — ABNORMAL LOW (ref 13.0–17.0)
Immature Granulocytes: 0 %
Lymphocytes Relative: 40 %
Lymphs Abs: 3.9 10*3/uL (ref 0.7–4.0)
MCH: 29.9 pg (ref 26.0–34.0)
MCHC: 31.1 g/dL (ref 30.0–36.0)
MCV: 96.1 fL (ref 80.0–100.0)
Monocytes Absolute: 0.9 10*3/uL (ref 0.1–1.0)
Monocytes Relative: 10 %
Neutro Abs: 3.9 10*3/uL (ref 1.7–7.7)
Neutrophils Relative %: 41 %
Platelets: 229 10*3/uL (ref 150–400)
RBC: 4.11 MIL/uL — ABNORMAL LOW (ref 4.22–5.81)
RDW: 13.4 % (ref 11.5–15.5)
WBC: 9.6 10*3/uL (ref 4.0–10.5)
nRBC: 0 % (ref 0.0–0.2)

## 2019-08-03 LAB — MAGNESIUM: Magnesium: 2.1 mg/dL (ref 1.7–2.4)

## 2019-08-03 MED ORDER — HEPARIN SOD (PORK) LOCK FLUSH 100 UNIT/ML IV SOLN
500.0000 [IU] | Freq: Once | INTRAVENOUS | Status: AC | PRN
  Administered 2019-08-03: 500 [IU]

## 2019-08-03 MED ORDER — SODIUM CHLORIDE 0.9% FLUSH
10.0000 mL | INTRAVENOUS | Status: DC | PRN
  Administered 2019-08-03: 10 mL

## 2019-08-03 MED ORDER — SODIUM CHLORIDE 0.9 % IV SOLN: Freq: Once | INTRAVENOUS | Status: AC

## 2019-08-03 MED ORDER — SODIUM CHLORIDE 0.9 % IV SOLN
2.0000 mg/kg | Freq: Once | INTRAVENOUS | Status: AC
  Administered 2019-08-03: 200 mg via INTRAVENOUS
  Filled 2019-08-03: qty 8

## 2019-08-03 NOTE — Progress Notes (Signed)
[place] [place], [place] [address]   CLINIC:  Medical Oncology/Hematology  PCP:  Celene Squibb, MD [place] [address] [phone]   REASON FOR VISIT:  Follow-up for stage IV adenocarcinoma of the right lung   BRIEF ONCOLOGIC HISTORY:  Oncology History  Primary cancer of right upper lobe of lung ([place])  05/14/2015 Procedure   Port placement by Dr. Arnoldo Morale   04/01/2016 Initial Diagnosis   Primary cancer of right upper lobe of lung ([place])   04/02/2016 Procedure   Ultrasound-guided core biopsy performed of a solid 1.5 cm soft tissue nodule in the right chest wall.   04/03/2016 Imaging   MRI brain- No acute and intracranial process or metastasis.  Moderate chronic small vessel ischemic disease.   04/04/2016 Pathology Results   Diagnosis Soft Tissue Needle Core Biopsy, Right Chest Wall SMOOTH MUSCLE NEOPLASM Microscopic Comment The differential diagnosis include low grade leiomyosarcoma. The neoplasm shows spindle cell morphology with rare mitotic figures and minimal atypia, no necrosis present. These cells stains positive for smooth muscle actin, desmin, negative for cytokeratin AE1&3, ck8/18, s100, cd117, cd 34 and cd99. This case also reviewed by Dr. [name] and agree.   04/05/2016 Procedure   CT-guided biopsy of right upper lobe lesion, with tissue specimen sent to pathology, by IR.   04/09/2016 Pathology Results   Lung, needle/core biopsy(ies), Right Upper Lobe - NON SMALL CELL CARCINOMA.   04/17/2016 Pathology Results   PDL1 High Expression.  TPS 50%.   04/24/2016 PET scan   1. Prominently hypermetabolic large right upper lobe mass with metastatic disease to the right paratracheal and right hilar nodal chains, to the descending mesocolon, to the left upper buttock subcutaneous tissues, to the left upper lobe, to the right subscapularis muscle, and possibly to the left adrenal gland and right breast  subcutaneous tissues. Appearance compatible with stage IV lung cancer. 2. There is some focal high activity in the ascending colon which is probably from peristalsis, less likely from a local colon mass. This does raise the possibility that the adjacent mesocolon tumor implant could be due to synchronous colon cancer rather than lung metastasis. 3. Other imaging findings of potential clinical significance: Coronary, aortic arch, and branch vessel atherosclerotic vascular disease. Aortoiliac atherosclerotic vascular disease. Deformity left proximal humerus and left glenohumeral joint from prior trauma. Enlarged prostate gland.   04/26/2016 Pathology Results   FoundationONE: Genomic alterations identified- KRAS X83A, CREBBP splice site [id number], LRP1B S515*, AYO45 T97*, FSF42 splice site [id number], SPTA1 splice site [id number], TP53 C135Y.  Additional findings- MS-Stable, TMB-intermediate (14 Muts/Mb).  No reportable alterations- EGFR, ALK, BRAF, MET, ERBB2, RET, ROS1.   05/17/2016 -  Chemotherapy   The patient had ONDANSETRON IVPB [facility] +/- DEXAMETHASONE, , Intravenous,  Once, 0 of 1 cycle  pembrolizumab (KEYTRUDA) 200 mg in sodium chloride 0.9 % 50 mL chemo infusion, 200 mg, Intravenous, Once, 0 of 5 cycles  for chemotherapy treatment.     08/06/2016 PET scan   IMPRESSION: 1. Overall considerable improvement. The right upper lobe mass is markedly reduced in volume and SUV. Thoracic adenopathy have resolved and the hypermetabolic lesion in the right subscapularis muscle has also resolved. 2. The left upper lobe nodule is no longer appreciably hypermetabolic, and is highly indistinct although this may be due to motion artifact. Faint in indistinct nodularity elsewhere in the lungs likewise not currently hypermetabolic. 3. There is some very faint residual low-grade activity along the subcutaneous deposit  along the upper left buttock region. Marked reduction in size and activity of  the tumor deposit adjacent to the descending colon. 4. Prior right pleural effusion has resolved. 5. Stable appearance of the adrenal glands, with low-level activity and a nodule in the left adrenal gland. 6. Stable small nodule in the right breast, low-grade metabolic activity with maximum SUV 2.2 (formerly 3.0) 7. Other imaging findings of potential clinical significance: Severe arthropathy of the left glenohumeral joint. Coronary, aortic arch, and branch vessel atherosclerotic vascular disease. Aortoiliac atherosclerotic vascular disease. Prominent prostate gland indents the bladder base.    12/16/2018 -  Chemotherapy   The patient had palonosetron (ALOXI) injection 0.25 mg, 0.25 mg, Intravenous,  Once, 4 of 4 cycles Administration: 0.25 mg (12/16/2018), 0.25 mg (01/27/2019), 0.25 mg (03/03/2019), 0.25 mg (01/06/2019) pegfilgrastim (NEULASTA) injection 6 mg, 6 mg, Subcutaneous, Once, 2 of 2 cycles Administration: 6 mg (01/08/2019) pegfilgrastim (NEULASTA ONPRO KIT) injection 6 mg, 6 mg, Subcutaneous, Once, 2 of 2 cycles pegfilgrastim-cbqv (UDENYCA) injection 6 mg, 6 mg, Subcutaneous, Once, 2 of 2 cycles Administration: 6 mg (01/29/2019), 6 mg (03/05/2019) ondansetron (ZOFRAN) 8 mg in sodium chloride 0.9 % 50 mL IVPB, 8 mg (100 % of original dose 8 mg), Intravenous,  Once, 3 of 3 cycles Dose modification: 8 mg (original dose 8 mg, Cycle 5) Administration: 8 mg (03/24/2019), 8 mg (04/16/2019) PEMEtrexed (ALIMTA) 1,100 mg in sodium chloride 0.9 % 100 mL chemo infusion, 490 mg/m2 = 1,125 mg, Intravenous,  Once, 6 of 6 cycles Dose modification: 400 mg/m2 (80 % of original dose 500 mg/m2, Cycle 3, Reason: Other (see comments), Comment: rash, swelling) Administration: 1,100 mg (12/16/2018), 900 mg (01/27/2019), 900 mg (03/03/2019), 900 mg (03/24/2019), 900 mg (04/16/2019), 1,100 mg (01/06/2019) CARBOplatin (PARAPLATIN) 500 mg in sodium chloride 0.9 % 250 mL chemo infusion, 530 mg (110.7 % of original  dose 477.5 mg), Intravenous,  Once, 4 of 4 cycles Dose modification:   (original dose 477.5 mg, Cycle 1),   (original dose 485.5 mg, Cycle 3),   (original dose 494 mg, Cycle 4),   (original dose 494 mg, Cycle 2) Administration: 500 mg (12/16/2018), 490 mg (01/27/2019), 490 mg (03/03/2019), 490 mg (01/06/2019) pembrolizumab (KEYTRUDA) 200 mg in sodium chloride 0.9 % 50 mL chemo infusion, 2 mg/kg = 200 mg, Intravenous, Once, 6 of 7 cycles Administration: 200 mg (04/16/2019), 200 mg (05/31/2019), 200 mg (06/22/2019), 200 mg (07/13/2019), 200 mg (05/10/2019), 200 mg (08/03/2019) fosaprepitant (EMEND) 150 mg, dexamethasone (DECADRON) 12 mg in sodium chloride 0.9 % 145 mL IVPB, , Intravenous,  Once, 4 of 4 cycles Administration:  (12/16/2018),  (01/27/2019),  (03/03/2019),  (01/06/2019)  for chemotherapy treatment.    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  Two adjacent large masslike areas of consolidation within the right upper lobe with differential considerations including malignancy or pneumonia. Multiple enlarged right hilar and mediastinal lymph nodes which may be reactive or metastatic in etiology.  Additional indeterminate pulmonary nodules as above. Recommend attention on follow-up.  Indeterminate 2.9 cm left adrenal nodule. This needs dedicated evaluation with pre and post contrast-enhanced CT or MRI after confirmation of pulmonary process.  Indeterminate nodule within the right breast. Recommend dedicated evaluation with mammography.  Hepatic steatosis.   04/02/2016 Procedure   Ultrasound-guided core biopsy performed of a solid 1.5 cm soft tissue nodule in the right chest wall.   04/04/2016 Pathology Results  Diagnosis Soft Tissue Needle Core Biopsy, Right Chest Wall SMOOTH MUSCLE NEOPLASM Microscopic Comment The differential diagnosis include low  grade leiomyosarcoma. The neoplasm shows spindle cell morphology with rare mitotic figures and minimal atypia, no necrosis present. These cells stains positive for smooth muscle actin, desmin, negative for cytokeratin AE1&3, ck8/18, s100, cd117, cd 34 and cd99. This case also reviewed by Dr. [name] and agree.       CANCER STAGING: Cancer Staging Primary cancer of right upper lobe of lung ([place]) Staging form: Lung, AJCC 8th Edition - Clinical: Stage IVB (cT3, cN2, cM1c) - Signed by Baird Cancer, PA-C on 05/07/2016    INTERVAL HISTORY:  Mr. Jeff 74 y.o. male seen for follow-up of stage IV adenocarcinoma of the lung.  He is on Keytruda.  He is here for toxicity assessment prior to next cycle.  Appetite is reported as 100%.  Energy levels are 25%.  Skin dryness and itching on the legs has been stable.  He was started on Cymbalta for his neuropathy.  He is already taking gabapentin 3 times a day.  He is taking stool softener daily.  However he still has some constipation.  Denies any diarrhea.  REVIEW OF SYSTEMS:  Review of Systems  Gastrointestinal: Positive for constipation.  Skin: Positive for itching.  Neurological: Positive for numbness.  All other systems reviewed and are negative.    PAST MEDICAL/SURGICAL HISTORY:  Past Medical History:  Diagnosis Date  . Adrenal mass, left ([place]) 04/01/2016  . Diabetes mellitus ([place]) 05/02/2016  . Diabetes mellitus without complication ([place])   . Hypertension   . Mass of breast, right 04/01/2016  . Mass of upper lobe of right lung 04/01/2016   2 adjacent, 5 cm masses, hilar adenopathy  . Primary cancer of right upper lobe of lung ([place]) 04/01/2016   2 adjacent, 5 cm masses, hilar adenopathy  . Pulmonary embolus ([place]) 03/31/2016  . 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 COLONOSCOPY N/A 07/28/2017   Procedure: COLONOSCOPY;  Surgeon: Rogene Houston, MD;  Location: [facility] ENDO [facility];  Service:  Endoscopy;  Laterality: N/A;  205  . PORTACATH PLACEMENT Left 05/13/2016   Procedure: INSERTION PORT-A-CATH;  Surgeon: Aviva Signs, MD;  Location: [facility] ORS;  Service: General;  Laterality: Left;     SOCIAL HISTORY:  Social History   Socioeconomic History  . Marital status: Divorced    Spouse name: Not on file  . Number of children: Not on file  . Years of education: Not on file  . Highest education level: Not on file  Occupational History  . Not on file  Tobacco Use  . Smoking status: Former Smoker    Years: 61.00    Quit date: 04/11/2014    Years since quitting: 5.3  . Smokeless tobacco: Never Used  . Tobacco comment: patient does vape smoking x 2 years  Substance and Sexual Activity  . Alcohol use: Not Currently    Comment: quit 10 years  . Drug use: No  . Sexual activity: Not on file  Other Topics Concern  . Not on file  Social History Narrative  . Not on file   Social Determinants of Health   Financial Resource Strain:   . Difficulty of Paying Living Expenses:   Food Insecurity:   . Worried About Charity fundraiser in the Last Year:   . Arboriculturist in the Last Year:   Transportation Needs: No Transportation  Needs  . Lack of Transportation (Medical): No  . Lack of Transportation (Non-Medical): No  Physical Activity:   . Days of Exercise per Week:   . Minutes of Exercise per Session:   Stress:   . Feeling of Stress :   Social Connections:   . Frequency of Communication with Friends and Family:   . Frequency of Social Gatherings with Friends and Family:   . Attends Religious Services:   . Active Member of Clubs or Organizations:   . Attends Archivist Meetings:   Marland Kitchen Marital Status:   Intimate Partner Violence:   . Fear of Current or Ex-Partner:   . Emotionally Abused:   Marland Kitchen Physically Abused:   . Sexually Abused:     FAMILY HISTORY:  Family History  Problem Relation Age of Onset  . Stroke Mother   . Heart attack Father   . Heart attack  Brother     CURRENT MEDICATIONS:  Outpatient Encounter Medications as of 08/03/2019  Medication Sig  . ALPRAZolam (XANAX) 1 MG tablet Take 1 mg by mouth 3 (three) times daily as needed for anxiety.  Marland Kitchen amLODipine (NORVASC) 2.5 MG tablet Take 2.5 mg by mouth daily.  Marland Kitchen CARBOPLATIN IV Inject into the vein every 21 ( twenty-one) days.  . DULoxetine (CYMBALTA) 30 MG capsule Take 30 mg by mouth daily.  . folic acid (FOLVITE) 1 MG tablet Take 1 tablet (1 mg total) by mouth daily. Start 5-7 days before Alimta chemotherapy. Continue until 21 days after Alimta completed.  . furosemide (LASIX) 20 MG tablet Take 2 tablets (40 mg total) by mouth daily.  Marland Kitchen gabapentin (NEURONTIN) 400 MG capsule Take 1 capsule (400 mg total) by mouth 3 (three) times daily.  Marland Kitchen GLIPIZIDE XL 5 MG 24 hr tablet Take 5 mg by mouth daily.  . hydrocortisone 2.5 % lotion Apply topically as needed.   . lidocaine-prilocaine (EMLA) cream APPLY A QUARTER SIZE AMOUNT TO THE AFFECTED AREA 1 HOUR PRIOR TO COMING TO CHEMOTHERAPY. COVER WITH A PLASTIC WRAP.  . magnesium oxide (MAG-OX) 400 (241.3 Mg) MG tablet Take 1 tablet (400 mg total) by mouth 2 (two) times daily.  . metFORMIN (GLUCOPHAGE) 500 MG tablet Take 500 mg by mouth 2 (two) times daily.  . metoprolol succinate (TOPROL-XL) 25 MG 24 hr tablet TAKE ONE TABLET BY MOUTH IN THE EVENING.  Marland Kitchen ondansetron (ZOFRAN) 4 MG tablet Take 1 tablet (4 mg total) by mouth every 8 (eight) hours as needed for nausea or vomiting.  Marland Kitchen oxyCODONE-acetaminophen (PERCOCET) 10-325 MG tablet Take 1 tablet by mouth as needed.   . Pembrolizumab (KEYTRUDA IV) Inject into the vein.  Marland Kitchen PEMEtrexed 500 mg/m2 in sodium chloride 0.9 % 100 mL Inject into the vein every 21 ( twenty-one) days.  . potassium chloride (K-DUR,KLOR-CON) 10 MEQ tablet Take 10 mEq by mouth daily.   . prochlorperazine (COMPAZINE) 10 MG tablet Take 1 tablet (10 mg total) by mouth every 6 (six) hours as needed (Nausea or vomiting).  . simvastatin  (ZOCOR) 20 MG tablet Take 20 mg by mouth every evening.  . torsemide (DEMADEX) 20 MG tablet Take 1 tablet (20 mg total) by mouth daily.  Marland Kitchen lactulose (CHRONULAC) 10 GM/15ML solution Take 30 mLs (20 g total) by mouth at bedtime as needed for mild constipation.   No facility-administered encounter medications on file as of 08/03/2019.    ALLERGIES:  No Known Allergies   PHYSICAL EXAM:  ECOG Performance status: 1  Vitals:   08/03/19  1000  BP: (!) 157/84  Pulse: 87  Resp: 16  Temp: (!) 97.3 F (36.3 C)  SpO2: 93%   Filed Weights   08/03/19 1000  Weight: 224 lb (101.6 kg)    Physical Exam Vitals reviewed.  Constitutional:      Appearance: Normal appearance.  Cardiovascular:     Rate and Rhythm: Normal rate and regular rhythm.     Heart sounds: Normal heart sounds.  Pulmonary:     Effort: Pulmonary effort is normal.     Breath sounds: Normal breath sounds.  Abdominal:     General: There is no distension.     Palpations: Abdomen is soft. There is no mass.  Musculoskeletal:        General: No swelling.  Skin:    General: Skin is warm.  Neurological:     General: No focal deficit present.     Mental Status: He is alert and oriented to person, place, and time.  Psychiatric:        Mood and Affect: Mood normal.        Behavior: Behavior normal.      LABORATORY DATA:  I have reviewed the labs as listed.  CBC    Component Value Date/Time   WBC 9.6 08/03/2019 0915   RBC 4.11 (L) 08/03/2019 0915   HGB 12.3 (L) 08/03/2019 0915   HGB 13.5 05/02/2016 0852   HCT 39.5 08/03/2019 0915   HCT 41.0 05/02/2016 0852   PLT 229 08/03/2019 0915   PLT 436 (H) 05/02/2016 0852   MCV 96.1 08/03/2019 0915   MCV 85.7 05/02/2016 0852   MCH 29.9 08/03/2019 0915   MCHC 31.1 08/03/2019 0915   RDW 13.4 08/03/2019 0915   RDW 13.9 05/02/2016 0852   LYMPHSABS 3.9 08/03/2019 0915   LYMPHSABS 3.4 (H) 05/02/2016 0852   MONOABS 0.9 08/03/2019 0915   MONOABS 1.2 (H) 05/02/2016 0852    EOSABS 0.8 (H) 08/03/2019 0915   EOSABS 1.6 (H) 05/02/2016 0852   BASOSABS 0.1 08/03/2019 0915   BASOSABS 0.2 (H) 05/02/2016 0852   CMP Latest Ref Rng & Units 08/03/2019 07/13/2019 06/22/2019  Glucose 70 - 99 mg/dL 130(H) 169(H) 146(H)  BUN 8 - 23 mg/dL 18 27(H) 24(H)  Creatinine 0.61 - 1.24 mg/dL 1.49(H) 1.39(H) 1.41(H)  Sodium 135 - 145 mmol/L 141 141 142  Potassium 3.5 - 5.1 mmol/L 4.5 3.9 3.9  Chloride 98 - 111 mmol/L 103 103 105  CO2 22 - 32 mmol/L '28 28 27  '$ Calcium 8.9 - 10.3 mg/dL 9.0 9.0 8.9  Total Protein 6.5 - 8.1 g/dL 7.0 7.3 7.1  Total Bilirubin 0.3 - 1.2 mg/dL 0.4 0.6 0.5  Alkaline Phos 38 - 126 U/L 87 103 96  AST 15 - 41 U/L 14(L) 17 20  ALT 0 - 44 U/L '12 9 18       '$ DIAGNOSTIC IMAGING:  I have reviewed the scans.     ASSESSMENT & PLAN:   Primary cancer of right upper lobe of lung ([place]) 1.  Stage IV adenocarcinoma of the lung, PD-L1 50%, other actionable mutations negative: -Keytruda from 05/17/2016 through 09/24/2018 with mild progression. -4 cycles of carboplatin and pemetrexed from 12/25/2018 through 02/23/2019. -Pemetrexed maintenance started on 03/24/2019, last treatment on 04/12/2019, discontinued secondary to renal insufficiency. -CT chest on 07/08/2019 showed slight decrease in the right upper lobe mass along the major fissure.  Stable appearing small mediastinal lymph nodes.  Stable left adrenal mass.   -He is tolerating Keytruda reasonably well. -  I have reviewed his labs.  White count is 9.6, platelet count 229.  Creatinine went up slightly to 1.49.  TSH on 07/13/2019 was 3.38.  LFTs were normal. -He will proceed with his next cycle today.  I will reevaluate him in 3 weeks.  2.  Brain metastasis: -SRS to the right parietal lobe 16.6 x 13.5 x 15.5 mm lesion with surrounding edema on 11/24/2018. -MRI of the brain on 03/11/2019 showed right parietal operculum metastatic disease smaller, measuring 4.5 mm.  Marked improvement in vasogenic edema. -MRI on 06/17/2019 did  not show any new lesions.  3.  Cough with clear sputum: -This has improved with Mucinex.  4.  Itching rash: -He will use moisturizing lotion on the legs.  He has dry skin from immunotherapy.  5.  Peripheral neuropathy: -I have increased his gabapentin to 400 mg 3 times a day. -Dr. [name] has started him on duloxetine 30 mg daily.  6.  Hypomagnesemia: -Magnesium is 2.1.  He will continue magnesium twice daily.  7.  Constipation: -He is taking stool softener daily.  However constipation has not improved. -We will start him on lactulose 30 mL at bedtime.      Orders placed this encounter:  No orders of the defined types were placed in this encounter.     Derek Jack, MD [place] [phone]

## 2019-08-03 NOTE — Patient Instructions (Addendum)
[place] at [facility] Discharge Instructions  You were seen today by Dr. Delton Coombes. He went over your recent lab and scan results. Continue Duloxetine that Dr. [name] started you on.  We will send in Lactulose 2 tbsp every night for constipation.  He will see you back in 3 weeks for labs and follow up.   Thank you for choosing [place] at [facility] to provide your oncology and hematology care.  To afford each patient quality time with our provider, please arrive at least 15 minutes before your scheduled appointment time.   If you have a lab appointment with the [facility] please come in thru the  Main Entrance and check in at the main information desk  You need to re-schedule your appointment should you arrive 10 or more minutes late.  We strive to give you quality time with our providers, and arriving late affects you and other patients whose appointments are after yours.  Also, if you no show three or more times for appointments you may be dismissed from the clinic at the providers discretion.     Again, thank you for choosing [facility].  Our hope is that these requests will decrease the amount of time that you wait before being seen by our physicians.       _____________________________________________________________  Should you have questions after your visit to [facility] please contact our office at [phone] between the hours of 8:00 a.m. and 4:30 p.m.  Voicemails left after 4:00 p.m. will not be returned until the following business day.  For prescription refill requests, have your pharmacy contact our office and allow 72 hours.    Cancer Center Support Programs:   > Cancer Support Group  2nd Tuesday of the month 1pm-2pm, Journey Room

## 2019-08-03 NOTE — Progress Notes (Signed)
Patient has been assessed, vital signs and labs have been reviewed by Dr. Katragadda. ANC, Creatinine, LFTs, and Platelets are within treatment parameters per Dr. Katragadda. The patient is good to proceed with treatment at this time.  

## 2019-08-03 NOTE — Progress Notes (Signed)
Labs reviewed with MD today. Proceed with treatment today per MD.   Treatment given per orders. Patient tolerated it well without problems. Vitals stable and discharged home from clinic ambulatory. Follow up as scheduled.

## 2019-08-03 NOTE — Patient Instructions (Signed)
[facility] Discharge Instructions for Patients Receiving Chemotherapy  Today you received the following chemotherapy agents   To help prevent nausea and vomiting after your treatment, we encourage you to take your nausea medication   If you develop nausea and vomiting that is not controlled by your nausea medication, call the clinic.   BELOW ARE SYMPTOMS THAT SHOULD BE REPORTED IMMEDIATELY:  *FEVER GREATER THAN 100.5 F  *CHILLS WITH OR WITHOUT FEVER  NAUSEA AND VOMITING THAT IS NOT CONTROLLED WITH YOUR NAUSEA MEDICATION  *UNUSUAL SHORTNESS OF BREATH  *UNUSUAL BRUISING OR BLEEDING  TENDERNESS IN MOUTH AND THROAT WITH OR WITHOUT PRESENCE OF ULCERS  *URINARY PROBLEMS  *BOWEL PROBLEMS  UNUSUAL RASH Items with * indicate a potential emergency and should be followed up as soon as possible.  Feel free to call the clinic should you have any questions or concerns. The clinic phone number is [phone].  Please show the CHEMO ALERT CARD at check-in to the Emergency Department and triage nurse.   

## 2019-08-04 NOTE — Addendum Note (Signed)
Addended by: Pincus Large on: 08/04/2019 12:56 PM   Modules accepted: Orders

## 2019-08-06 MED ORDER — LACTULOSE 10 GM/15ML PO SOLN: 20.0000 g | Freq: Every evening | ORAL | 2 refills | Status: AC | PRN

## 2019-08-06 NOTE — Assessment & Plan Note (Addendum)
1.  Stage IV adenocarcinoma of the lung, PD-L1 50%, other actionable mutations negative: -Keytruda from 05/17/2016 through 09/24/2018 with mild progression. -4 cycles of carboplatin and pemetrexed from 12/25/2018 through 02/23/2019. -Pemetrexed maintenance started on 03/24/2019, last treatment on 04/12/2019, discontinued secondary to renal insufficiency. -CT chest on 07/08/2019 showed slight decrease in the right upper lobe mass along the major fissure.  Stable appearing small mediastinal lymph nodes.  Stable left adrenal mass.   -He is tolerating Keytruda reasonably well. -I have reviewed his labs.  White count is 9.6, platelet count 229.  Creatinine went up slightly to 1.49.  TSH on 07/13/2019 was 3.38.  LFTs were normal. -He will proceed with his next cycle today.  I will reevaluate him in 3 weeks.  2.  Brain metastasis: -SRS to the right parietal lobe 16.6 x 13.5 x 15.5 mm lesion with surrounding edema on 11/24/2018. -MRI of the brain on 03/11/2019 showed right parietal operculum metastatic disease smaller, measuring 4.5 mm.  Marked improvement in vasogenic edema. -MRI on 06/17/2019 did not show any new lesions.  3.  Cough with clear sputum: -This has improved with Mucinex.  4.  Itching rash: -He will use moisturizing lotion on the legs.  He has dry skin from immunotherapy.  5.  Peripheral neuropathy: -I have increased his gabapentin to 400 mg 3 times a day. -Dr. Hall has started him on duloxetine 30 mg daily.  6.  Hypomagnesemia: -Magnesium is 2.1.  He will continue magnesium twice daily.  7.  Constipation: -He is taking stool softener daily.  However constipation has not improved. -We will start him on lactulose 30 mL at bedtime. 

## 2019-08-09 ENCOUNTER — Other Ambulatory Visit: Payer: Self-pay | Admitting: Radiation Oncology

## 2019-08-09 ENCOUNTER — Other Ambulatory Visit: Payer: Self-pay | Admitting: *Deleted

## 2019-08-09 DIAGNOSIS — C7931 Secondary malignant neoplasm of brain: Secondary | ICD-10-CM

## 2019-08-09 MED ORDER — ALPRAZOLAM 1 MG PO TABS: ORAL_TABLET | ORAL | 0 refills | Status: DC

## 2019-08-13 ENCOUNTER — Other Ambulatory Visit: Payer: Self-pay | Admitting: Radiation Therapy

## 2019-08-18 NOTE — Progress Notes (Signed)

## 2019-08-24 ENCOUNTER — Other Ambulatory Visit: Payer: Self-pay

## 2019-08-24 ENCOUNTER — Inpatient Hospital Stay (HOSPITAL_COMMUNITY): Payer: Medicare Other

## 2019-08-24 ENCOUNTER — Inpatient Hospital Stay (HOSPITAL_COMMUNITY): Payer: Medicare Other | Attending: Hematology | Admitting: Hematology

## 2019-08-24 ENCOUNTER — Encounter (HOSPITAL_COMMUNITY): Payer: Self-pay | Admitting: Hematology

## 2019-08-24 VITALS — BP 139/71 | HR 70 | Temp 97.9°F | Resp 18

## 2019-08-24 DIAGNOSIS — C7931 Secondary malignant neoplasm of brain: Secondary | ICD-10-CM | POA: Insufficient documentation

## 2019-08-24 DIAGNOSIS — C3411 Malignant neoplasm of upper lobe, right bronchus or lung: Secondary | ICD-10-CM

## 2019-08-24 DIAGNOSIS — Z79899 Other long term (current) drug therapy: Secondary | ICD-10-CM | POA: Diagnosis not present

## 2019-08-24 DIAGNOSIS — E1142 Type 2 diabetes mellitus with diabetic polyneuropathy: Secondary | ICD-10-CM | POA: Diagnosis not present

## 2019-08-24 DIAGNOSIS — K5900 Constipation, unspecified: Secondary | ICD-10-CM | POA: Diagnosis not present

## 2019-08-24 DIAGNOSIS — Z5112 Encounter for antineoplastic immunotherapy: Secondary | ICD-10-CM | POA: Diagnosis not present

## 2019-08-24 LAB — CBC WITH DIFFERENTIAL/PLATELET
Abs Immature Granulocytes: 0.05 10*3/uL (ref 0.00–0.07)
Basophils Absolute: 0.1 10*3/uL (ref 0.0–0.1)
Basophils Relative: 1 %
Eosinophils Absolute: 0.9 10*3/uL — ABNORMAL HIGH (ref 0.0–0.5)
Eosinophils Relative: 7 %
HCT: 41.6 % (ref 39.0–52.0)
Hemoglobin: 13.1 g/dL (ref 13.0–17.0)
Immature Granulocytes: 0 %
Lymphocytes Relative: 40 %
Lymphs Abs: 5.3 10*3/uL — ABNORMAL HIGH (ref 0.7–4.0)
MCH: 29.4 pg (ref 26.0–34.0)
MCHC: 31.5 g/dL (ref 30.0–36.0)
MCV: 93.3 fL (ref 80.0–100.0)
Monocytes Absolute: 1 10*3/uL (ref 0.1–1.0)
Monocytes Relative: 8 %
Neutro Abs: 5.8 10*3/uL (ref 1.7–7.7)
Neutrophils Relative %: 44 %
Platelets: 287 10*3/uL (ref 150–400)
RBC: 4.46 MIL/uL (ref 4.22–5.81)
RDW: 13.4 % (ref 11.5–15.5)
WBC: 13.1 10*3/uL — ABNORMAL HIGH (ref 4.0–10.5)
nRBC: 0 % (ref 0.0–0.2)

## 2019-08-24 LAB — COMPREHENSIVE METABOLIC PANEL
ALT: 15 U/L (ref 0–44)
AST: 16 U/L (ref 15–41)
Albumin: 3.9 g/dL (ref 3.5–5.0)
Alkaline Phosphatase: 97 U/L (ref 38–126)
Anion gap: 11 (ref 5–15)
BUN: 25 mg/dL — ABNORMAL HIGH (ref 8–23)
CO2: 29 mmol/L (ref 22–32)
Calcium: 9.3 mg/dL (ref 8.9–10.3)
Chloride: 102 mmol/L (ref 98–111)
Creatinine, Ser: 1.56 mg/dL — ABNORMAL HIGH (ref 0.61–1.24)
GFR calc Af Amer: 50 mL/min — ABNORMAL LOW (ref >60)
GFR calc non Af Amer: 43 mL/min — ABNORMAL LOW (ref >60)
Glucose, Bld: 176 mg/dL — ABNORMAL HIGH (ref 70–99)
Potassium: 4 mmol/L (ref 3.5–5.1)
Sodium: 142 mmol/L (ref 135–145)
Total Bilirubin: 0.4 mg/dL (ref 0.3–1.2)
Total Protein: 7.2 g/dL (ref 6.5–8.1)

## 2019-08-24 LAB — MAGNESIUM: Magnesium: 2.2 mg/dL (ref 1.7–2.4)

## 2019-08-24 MED ORDER — SODIUM CHLORIDE 0.9 % IV SOLN: Freq: Once | INTRAVENOUS | Status: AC

## 2019-08-24 MED ORDER — SODIUM CHLORIDE 0.9 % IV SOLN
2.0000 mg/kg | Freq: Once | INTRAVENOUS | Status: AC
  Administered 2019-08-24: 200 mg via INTRAVENOUS
  Filled 2019-08-24: qty 8

## 2019-08-24 MED ORDER — SODIUM CHLORIDE 0.9% FLUSH
10.0000 mL | INTRAVENOUS | Status: DC | PRN
  Administered 2019-08-24: 10 mL

## 2019-08-24 MED ORDER — LEVOFLOXACIN 250 MG PO TABS: 250.0000 mg | ORAL_TABLET | Freq: Every day | ORAL | 0 refills | Status: DC

## 2019-08-24 MED ORDER — HEPARIN SOD (PORK) LOCK FLUSH 100 UNIT/ML IV SOLN
500.0000 [IU] | Freq: Once | INTRAVENOUS | Status: AC | PRN
  Administered 2019-08-24: 500 [IU]

## 2019-08-24 MED ORDER — SODIUM CHLORIDE 0.9 % IV SOLN: INTRAVENOUS | Status: AC

## 2019-08-24 NOTE — Progress Notes (Signed)
1100 Labs reviewed with and pt seen by Dr. Delton Coombes and pt approved for Keytruda infusion today with 500 ml NS IV hydration over 1 hour added for today per MD order                       Darrell Christensen tolerated Keytruda infusion and IV hydration well without complaints or incident. VSS upon discharge. Pt discharged self ambulatory in satisfactory condition

## 2019-08-24 NOTE — Patient Instructions (Addendum)
[place] at [facility] Discharge Instructions  You were seen today by Dr. Delton Coombes. He went over your recent lab results. Make sure to take your lactulose twice a day to help with constipation. He is sending in a new prescription for Levaquin for your cough. Pick up Robitussin DM from the pharmacy to help with cough as well. He will see you back in 3 weeks for labs, treatment and follow up.   Thank you for choosing [place] at [facility] to provide your oncology and hematology care.  To afford each patient quality time with our provider, please arrive at least 15 minutes before your scheduled appointment time.   If you have a lab appointment with the [facility] please come in thru the  Main Entrance and check in at the main information desk  You need to re-schedule your appointment should you arrive 10 or more minutes late.  We strive to give you quality time with our providers, and arriving late affects you and other patients whose appointments are after yours.  Also, if you no show three or more times for appointments you may be dismissed from the clinic at the providers discretion.     Again, thank you for choosing [facility].  Our hope is that these requests will decrease the amount of time that you wait before being seen by our physicians.       _____________________________________________________________  Should you have questions after your visit to [facility], please contact our office at [phone] between the hours of 8:00 a.m. and 4:30 p.m.  Voicemails left after 4:00 p.m. will not be returned until the following business day.  For prescription refill requests, have your pharmacy contact our office and allow 72 hours.    Cancer Center Support Programs:   > Cancer Support Group  2nd Tuesday of the month 1pm-2pm, Journey Room

## 2019-08-24 NOTE — Progress Notes (Signed)
Patient has been assessed, vital signs and labs have been reviewed by Dr. Delton Coombes. ANC, Creatinine, LFTs, and Platelets are within treatment parameters per Dr. Delton Coombes. The patient is good to proceed with treatment at this time. Please give additional 500 mL of NS per Dr. Delton Coombes.

## 2019-08-24 NOTE — Assessment & Plan Note (Signed)
1.  Stage IV adenocarcinoma lung, PD-L1 50%, other actionable mutations negative: -Keytruda from 05/17/2016 through 09/24/2018 with mild progression. -4 cycles of carboplatin and pemetrexed from 12/25/2018 through 02/23/2019. -Pemetrexed maintenance from 03/24/2019 through 04/12/2019, discontinued secondary to renal insufficiency. -CT chest on 07/08/2019 showed slight decrease in the right upper lobe mass along the major fissure.  Stable appearing small mediastinal lymph nodes.  Stable left adrenal mass. -He reported some worsening of cough.  Clear expectoration.  Unlikely COPD exacerbation.  We will treat him with Levaquin 500 mg loading dose followed by 250 mg daily.  He is using a regular Robitussin which is not helping.  He has used [employer] in the past without help.  I have suggested him to take Robitussin-DM. -His creatinine is slightly elevated.  He will receive 100 mL of normal saline.  He will proceed with his treatment.  I will reevaluate him in 3 weeks.  2.  Brain metastasis: -SRS to the right parietal lobe lesion with surrounding edema on 11/24/2018. -MRI of brain on 06/17/2019 did not show any new lesions.  He is scheduled for another brain MRI soon.  3.  Itching rash: -This is from dry skin from immunotherapy.  He will continue to use moisturizing lotion.  4.  Peripheral neuropathy: -He will continue gabapentin 400 mg 3 times daily.  Duloxetine 30 mg daily.  5.  Hypomagnesemia: -Magnesium is normal today.  He will continue magnesium twice daily.  6.  Constipation: -He will continue lactulose twice daily as needed.

## 2019-08-24 NOTE — Patient Instructions (Signed)
[facility] Discharge Instructions for Patients Receiving Chemotherapy   Beginning January 23rd 2017 lab work for the [facility] will be done in the  Main lab at [facility] on 1st floor. If you have a lab appointment with the [facility] please come in thru the  Main Entrance and check in at the main information desk   Today you received the following chemotherapy agents Keytruda as well as IV hydration. Follow-up as scheduled. Call clinic for any questions or concerns  To help prevent nausea and vomiting after your treatment, we encourage you to take your nausea medication   If you develop nausea and vomiting, or diarrhea that is not controlled by your medication, call the clinic.  The clinic phone number is [phone]. Office hours are Monday-Friday 8:30am-5:00pm.  BELOW ARE SYMPTOMS THAT SHOULD BE REPORTED IMMEDIATELY:  *FEVER GREATER THAN 101.0 F  *CHILLS WITH OR WITHOUT FEVER  NAUSEA AND VOMITING THAT IS NOT CONTROLLED WITH YOUR NAUSEA MEDICATION  *UNUSUAL SHORTNESS OF BREATH  *UNUSUAL BRUISING OR BLEEDING  TENDERNESS IN MOUTH AND THROAT WITH OR WITHOUT PRESENCE OF ULCERS  *URINARY PROBLEMS  *BOWEL PROBLEMS  UNUSUAL RASH Items with * indicate a potential emergency and should be followed up as soon as possible. If you have an emergency after office hours please contact your primary care physician or go to the nearest emergency department.  Please call the clinic during office hours if you have any questions or concerns.   You may also contact the Patient Navigator at [phone] should you have any questions or need assistance in obtaining follow up care.      Resources For Cancer Patients and their Caregivers ? American [employer] Society: Can assist with transportation, wigs, general needs, runs Look Good Feel Better.        343 605 3046 ? Cancer Care: Provides financial assistance, online support groups, medication/co-pay assistance.   1-800-813-HOPE 562-208-5669) ? [facility] Assists [place] cancer patients and their families through emotional , educational and financial support.  402-696-2968 ? [place] DSS Where to apply for food stamps, Medicaid and utility assistance. 405-637-2864 ? [facility]: Transportation to medical appointments. 743-700-2025 ? Social Security Administration: May apply for disability if have a Stage IV cancer. 517 088 1485 431-607-4708 ? [employer], Disability and Transit Services: Assists with nutrition, care and transit needs. 936-657-6236

## 2019-08-24 NOTE — Progress Notes (Signed)
Darrell Christensen, Darrell Christensen [id number]   CLINIC:  Medical Oncology/Hematology  PCP:  Celene Squibb, MD [place] [address] [phone]   REASON FOR VISIT:  Follow-up for stage IV adenocarcinoma of the right lung   BRIEF ONCOLOGIC HISTORY:  Oncology History  Primary cancer of right upper lobe of lung ([place])  05/14/2015 Procedure   Port placement by Dr. Arnoldo Morale   04/01/2016 Initial Diagnosis   Primary cancer of right upper lobe of lung ([place])   04/02/2016 Procedure   Ultrasound-guided core biopsy performed of a solid 1.5 cm soft tissue nodule in the right chest wall.   04/03/2016 Imaging   MRI brain- No acute and intracranial process or metastasis.  Moderate chronic small vessel ischemic disease.   04/04/2016 Pathology Results   Diagnosis Soft Tissue Needle Core Biopsy, Right Chest Wall SMOOTH MUSCLE NEOPLASM Microscopic Comment The differential diagnosis include low grade leiomyosarcoma. The neoplasm shows spindle cell morphology with rare mitotic figures and minimal atypia, no necrosis present. These cells stains positive for smooth muscle actin, desmin, negative for cytokeratin AE1&3, ck8/18, s100, cd117, cd 34 and cd99. This case also reviewed by Dr. [name] and agree.   04/05/2016 Procedure   CT-guided biopsy of right upper lobe lesion, with tissue specimen sent to pathology, by IR.   04/09/2016 Pathology Results   Lung, needle/core biopsy(ies), Right Upper Lobe - NON SMALL CELL CARCINOMA.   04/17/2016 Pathology Results   PDL1 High Expression.  TPS 50%.   04/24/2016 PET scan   1. Prominently hypermetabolic large right upper lobe mass with metastatic disease to the right paratracheal and right hilar nodal chains, to the descending mesocolon, to the left upper buttock subcutaneous tissues, to the left upper lobe, to the right subscapularis muscle, and possibly to the left adrenal gland and right breast  subcutaneous tissues. Appearance compatible with stage IV lung cancer. 2. There is some focal high activity in the ascending colon which is probably from peristalsis, less likely from a local colon mass. This does raise the possibility that the adjacent mesocolon tumor implant could be due to synchronous colon cancer rather than lung metastasis. 3. Other imaging findings of potential clinical significance: Coronary, aortic arch, and branch vessel atherosclerotic vascular disease. Aortoiliac atherosclerotic vascular disease. Deformity left proximal humerus and left glenohumeral joint from prior trauma. Enlarged prostate gland.   04/26/2016 Pathology Results   FoundationONE: Genomic alterations identified- KRAS X83A, CREBBP splice site [id number], LRP1B S515*, AYO45 T97*, FSF42 splice site [id number], SPTA1 splice site [id number], TP53 C135Y.  Additional findings- MS-Stable, TMB-intermediate (14 Muts/Mb).  No reportable alterations- EGFR, ALK, BRAF, MET, ERBB2, RET, ROS1.   05/17/2016 -  Chemotherapy   The patient had ONDANSETRON IVPB [facility] +/- DEXAMETHASONE, , Intravenous,  Once, 0 of 1 cycle  pembrolizumab (KEYTRUDA) 200 mg in sodium chloride 0.9 % 50 mL chemo infusion, 200 mg, Intravenous, Once, 0 of 5 cycles  for chemotherapy treatment.     08/06/2016 PET scan   IMPRESSION: 1. Overall considerable improvement. The right upper lobe mass is markedly reduced in volume and SUV. Thoracic adenopathy have resolved and the hypermetabolic lesion in the right subscapularis muscle has also resolved. 2. The left upper lobe nodule is no longer appreciably hypermetabolic, and is highly indistinct although this may be due to motion artifact. Faint in indistinct nodularity elsewhere in the lungs likewise not currently hypermetabolic. 3. There is some very faint residual low-grade activity along the subcutaneous deposit  along the upper left buttock region. Marked reduction in size and activity of  the tumor deposit adjacent to the descending colon. 4. Prior right pleural effusion has resolved. 5. Stable appearance of the adrenal glands, with low-level activity and a nodule in the left adrenal gland. 6. Stable small nodule in the right breast, low-grade metabolic activity with maximum SUV 2.2 (formerly 3.0) 7. Other imaging findings of potential clinical significance: Severe arthropathy of the left glenohumeral joint. Coronary, aortic arch, and branch vessel atherosclerotic vascular disease. Aortoiliac atherosclerotic vascular disease. Prominent prostate gland indents the bladder base.    12/16/2018 -  Chemotherapy   The patient had palonosetron (ALOXI) injection 0.25 mg, 0.25 mg, Intravenous,  Once, 4 of 4 cycles Administration: 0.25 mg (12/16/2018), 0.25 mg (01/27/2019), 0.25 mg (03/03/2019), 0.25 mg (01/06/2019) pegfilgrastim (NEULASTA) injection 6 mg, 6 mg, Subcutaneous, Once, 2 of 2 cycles Administration: 6 mg (01/08/2019) pegfilgrastim (NEULASTA ONPRO KIT) injection 6 mg, 6 mg, Subcutaneous, Once, 2 of 2 cycles pegfilgrastim-cbqv (UDENYCA) injection 6 mg, 6 mg, Subcutaneous, Once, 2 of 2 cycles Administration: 6 mg (01/29/2019), 6 mg (03/05/2019) ondansetron (ZOFRAN) 8 mg in sodium chloride 0.9 % 50 mL IVPB, 8 mg (100 % of original dose 8 mg), Intravenous,  Once, 3 of 3 cycles Dose modification: 8 mg (original dose 8 mg, Cycle 5) Administration: 8 mg (03/24/2019), 8 mg (04/16/2019) PEMEtrexed (ALIMTA) 1,100 mg in sodium chloride 0.9 % 100 mL chemo infusion, 490 mg/m2 = 1,125 mg, Intravenous,  Once, 6 of 6 cycles Dose modification: 400 mg/m2 (80 % of original dose 500 mg/m2, Cycle 3, Reason: Other (see comments), Comment: rash, swelling) Administration: 1,100 mg (12/16/2018), 900 mg (01/27/2019), 900 mg (03/03/2019), 900 mg (03/24/2019), 900 mg (04/16/2019), 1,100 mg (01/06/2019) CARBOplatin (PARAPLATIN) 500 mg in sodium chloride 0.9 % 250 mL chemo infusion, 530 mg (110.7 % of original  dose 477.5 mg), Intravenous,  Once, 4 of 4 cycles Dose modification:   (original dose 477.5 mg, Cycle 1),   (original dose 485.5 mg, Cycle 3),   (original dose 494 mg, Cycle 4),   (original dose 494 mg, Cycle 2) Administration: 500 mg (12/16/2018), 490 mg (01/27/2019), 490 mg (03/03/2019), 490 mg (01/06/2019) pembrolizumab (KEYTRUDA) 200 mg in sodium chloride 0.9 % 50 mL chemo infusion, 2 mg/kg = 200 mg, Intravenous, Once, 7 of 8 cycles Administration: 200 mg (04/16/2019), 200 mg (05/31/2019), 200 mg (06/22/2019), 200 mg (07/13/2019), 200 mg (05/10/2019), 200 mg (08/03/2019), 200 mg (08/24/2019) fosaprepitant (EMEND) 150 mg, dexamethasone (DECADRON) 12 mg in sodium chloride 0.9 % 145 mL IVPB, , Intravenous,  Once, 4 of 4 cycles Administration:  (12/16/2018),  (01/27/2019),  (03/03/2019),  (01/06/2019)  for chemotherapy treatment.    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  Two adjacent large masslike areas of consolidation within the right upper lobe with differential considerations including malignancy or pneumonia. Multiple enlarged right hilar and mediastinal lymph nodes which may be reactive or metastatic in etiology.  Additional indeterminate pulmonary nodules as above. Recommend attention on follow-up.  Indeterminate 2.9 cm left adrenal nodule. This needs dedicated evaluation with pre and post contrast-enhanced CT or MRI after confirmation of pulmonary process.  Indeterminate nodule within the right breast. Recommend dedicated evaluation with mammography.  Hepatic steatosis.   04/02/2016 Procedure   Ultrasound-guided core biopsy performed of a solid 1.5 cm soft tissue nodule in the right chest wall.  04/04/2016 Pathology Results   Diagnosis Soft Tissue Needle Core Biopsy, Right Chest Wall SMOOTH MUSCLE NEOPLASM Microscopic Comment The differential  diagnosis include low grade leiomyosarcoma. The neoplasm shows spindle cell morphology with rare mitotic figures and minimal atypia, no necrosis present. These cells stains positive for smooth muscle actin, desmin, negative for cytokeratin AE1&3, ck8/18, s100, cd117, cd 34 and cd99. This case also reviewed by Dr. [name] and agree.       CANCER STAGING: Cancer Staging Primary cancer of right upper lobe of lung ([place]) Staging form: Lung, AJCC 8th Edition - Clinical: Stage IVB (cT3, cN2, cM1c) - Signed by Baird Cancer, PA-C on 05/07/2016    INTERVAL HISTORY:  Mr. Keil 74 y.o. male seen for follow-up of for stage IV adenocarcinoma of the lung.  Appetite is 100%.  Energy levels are 25%.  Reports cough which is not controlled with Robitussin.  Has clear sputum.  Has constipation and takes lactulose.  Numbness in the fingers and feet has been stable.  Denies any headaches.  REVIEW OF SYSTEMS:  Review of Systems  Respiratory: Positive for cough and shortness of breath.   Gastrointestinal: Positive for constipation.  Skin: Positive for itching.  Neurological: Positive for numbness.  All other systems reviewed and are negative.    PAST MEDICAL/SURGICAL HISTORY:  Past Medical History:  Diagnosis Date  . Adrenal mass, left ([place]) 04/01/2016  . Diabetes mellitus ([place]) 05/02/2016  . Diabetes mellitus without complication ([place])   . Hypertension   . Mass of breast, right 04/01/2016  . Mass of upper lobe of right lung 04/01/2016   2 adjacent, 5 cm masses, hilar adenopathy  . Primary cancer of right upper lobe of lung ([place]) 04/01/2016   2 adjacent, 5 cm masses, hilar adenopathy  . Pulmonary embolus ([place]) 03/31/2016  . 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 COLONOSCOPY N/A 07/28/2017   Procedure: COLONOSCOPY;  Surgeon: Rogene Houston, MD;  Location: [facility] ENDO SUITE;  Service: Endoscopy;  Laterality: N/A;  205  . PORTACATH PLACEMENT Left  05/13/2016   Procedure: INSERTION PORT-A-CATH;  Surgeon: Aviva Signs, MD;  Location: [facility] ORS;  Service: General;  Laterality: Left;     SOCIAL HISTORY:  Social History   Socioeconomic History  . Marital status: Divorced    Spouse name: Not on file  . Number of children: Not on file  . Years of education: Not on file  . Highest education level: Not on file  Occupational History  . Not on file  Tobacco Use  . Smoking status: Former Smoker    Years: 61.00    Quit date: 04/11/2014    Years since quitting: 5.3  . Smokeless tobacco: Never Used  . Tobacco comment: patient does vape smoking x 2 years  Substance and Sexual Activity  . Alcohol use: Not Currently    Comment: quit 10 years  . Drug use: No  . Sexual activity: Not on file  Other Topics Concern  . Not on file  Social History Narrative  . Not on file   Social Determinants of Health   Financial Resource Strain:   . Difficulty of Paying Living Expenses:   Food Insecurity:   . Worried About Charity fundraiser in the Last Year:   . Arboriculturist in the Last Year:   Transportation Needs: No Transportation Needs  . Lack of Transportation (Medical): No  . Lack of Transportation (Non-Medical): No  Physical  Activity:   . Days of Exercise per Week:   . Minutes of Exercise per Session:   Stress:   . Feeling of Stress :   Social Connections:   . Frequency of Communication with Friends and Family:   . Frequency of Social Gatherings with Friends and Family:   . Attends Religious Services:   . Active Member of Clubs or Organizations:   . Attends Archivist Meetings:   Marland Kitchen Marital Status:   Intimate Partner Violence:   . Fear of Current or Ex-Partner:   . Emotionally Abused:   Marland Kitchen Physically Abused:   . Sexually Abused:     FAMILY HISTORY:  Family History  Problem Relation Age of Onset  . Stroke Mother   . Heart attack Father   . Heart attack Brother     CURRENT MEDICATIONS:  Outpatient Encounter  Medications as of 08/24/2019  Medication Sig  . ALPRAZolam (XANAX) 1 MG tablet One tab po 30 minutes prior to MRI or radiation  . amLODipine (NORVASC) 2.5 MG tablet Take 2.5 mg by mouth daily.  Marland Kitchen CARBOPLATIN IV Inject into the vein every 21 ( twenty-one) days.  . DULoxetine (CYMBALTA) 30 MG capsule Take 30 mg by mouth daily.  . folic acid (FOLVITE) 1 MG tablet Take 1 tablet (1 mg total) by mouth daily. Start 5-7 days before Alimta chemotherapy. Continue until 21 days after Alimta completed.  . furosemide (LASIX) 20 MG tablet Take 2 tablets (40 mg total) by mouth daily.  Marland Kitchen gabapentin (NEURONTIN) 400 MG capsule Take 1 capsule (400 mg total) by mouth 3 (three) times daily.  Marland Kitchen GLIPIZIDE XL 5 MG 24 hr tablet Take 5 mg by mouth daily.  . hydrocortisone 2.5 % lotion Apply topically as needed.   . lactulose (CHRONULAC) 10 GM/15ML solution Take 30 mLs (20 g total) by mouth at bedtime as needed for mild constipation.  . lidocaine-prilocaine (EMLA) cream APPLY A QUARTER SIZE AMOUNT TO THE AFFECTED AREA 1 HOUR PRIOR TO COMING TO CHEMOTHERAPY. COVER WITH A PLASTIC WRAP.  . magnesium oxide (MAG-OX) 400 (241.3 Mg) MG tablet Take 1 tablet (400 mg total) by mouth 2 (two) times daily.  . metFORMIN (GLUCOPHAGE) 500 MG tablet Take 500 mg by mouth 2 (two) times daily.  . metoprolol succinate (TOPROL-XL) 25 MG 24 hr tablet TAKE ONE TABLET BY MOUTH IN THE EVENING.  Marland Kitchen ondansetron (ZOFRAN) 4 MG tablet Take 1 tablet (4 mg total) by mouth every 8 (eight) hours as needed for nausea or vomiting.  Marland Kitchen oxyCODONE-acetaminophen (PERCOCET) 10-325 MG tablet Take 1 tablet by mouth as needed.   . Pembrolizumab (KEYTRUDA IV) Inject into the vein.  Marland Kitchen PEMEtrexed 500 mg/m2 in sodium chloride 0.9 % 100 mL Inject into the vein every 21 ( twenty-one) days.  . potassium chloride (K-DUR,KLOR-CON) 10 MEQ tablet Take 10 mEq by mouth daily.   . prochlorperazine (COMPAZINE) 10 MG tablet Take 1 tablet (10 mg total) by mouth every 6 (six) hours as  needed (Nausea or vomiting).  . simvastatin (ZOCOR) 20 MG tablet Take 20 mg by mouth every evening.  . torsemide (DEMADEX) 20 MG tablet Take 1 tablet (20 mg total) by mouth daily.  Marland Kitchen levofloxacin (LEVAQUIN) 250 MG tablet Take 1 tablet (250 mg total) by mouth daily. Take 2 tabs on day1, then 1 tablet daily   No facility-administered encounter medications on file as of 08/24/2019.    ALLERGIES:  No Known Allergies   PHYSICAL EXAM:  ECOG Performance status: 1  Vitals:   08/24/19 1000  BP: (!) 158/75  Pulse: (!) 112  Resp: 18  Temp: (!) 97.5 F (36.4 C)  SpO2: 92%   Filed Weights   08/24/19 1000  Weight: 215 lb (97.5 kg)    Physical Exam Vitals reviewed.  Constitutional:      Appearance: Normal appearance.  Cardiovascular:     Rate and Rhythm: Normal rate and regular rhythm.     Heart sounds: Normal heart sounds.  Pulmonary:     Effort: Pulmonary effort is normal.     Breath sounds: Normal breath sounds.  Abdominal:     General: There is no distension.     Palpations: Abdomen is soft. There is no mass.  Musculoskeletal:        General: No swelling.  Skin:    General: Skin is warm.  Neurological:     General: No focal deficit present.     Mental Status: He is alert and oriented to person, place, and time.  Psychiatric:        Mood and Affect: Mood normal.        Behavior: Behavior normal.      LABORATORY DATA:  I have reviewed the labs as listed.  CBC    Component Value Date/Time   WBC 13.1 (H) 08/24/2019 0849   RBC 4.46 08/24/2019 0849   HGB 13.1 08/24/2019 0849   HGB 13.5 05/02/2016 0852   HCT 41.6 08/24/2019 0849   HCT 41.0 05/02/2016 0852   PLT 287 08/24/2019 0849   PLT 436 (H) 05/02/2016 0852   MCV 93.3 08/24/2019 0849   MCV 85.7 05/02/2016 0852   MCH 29.4 08/24/2019 0849   MCHC 31.5 08/24/2019 0849   RDW 13.4 08/24/2019 0849   RDW 13.9 05/02/2016 0852   LYMPHSABS 5.3 (H) 08/24/2019 0849   LYMPHSABS 3.4 (H) 05/02/2016 0852   MONOABS 1.0  08/24/2019 0849   MONOABS 1.2 (H) 05/02/2016 0852   EOSABS 0.9 (H) 08/24/2019 0849   EOSABS 1.6 (H) 05/02/2016 0852   BASOSABS 0.1 08/24/2019 0849   BASOSABS 0.2 (H) 05/02/2016 0852   CMP Latest Ref Rng & Units 08/24/2019 08/03/2019 07/13/2019  Glucose 70 - 99 mg/dL 176(H) 130(H) 169(H)  BUN 8 - 23 mg/dL 25(H) 18 27(H)  Creatinine 0.61 - 1.24 mg/dL 1.56(H) 1.49(H) 1.39(H)  Sodium 135 - 145 mmol/L 142 141 141  Potassium 3.5 - 5.1 mmol/L 4.0 4.5 3.9  Chloride 98 - 111 mmol/L 102 103 103  CO2 22 - 32 mmol/L '29 28 28  '$ Calcium 8.9 - 10.3 mg/dL 9.3 9.0 9.0  Total Protein 6.5 - 8.1 g/dL 7.2 7.0 7.3  Total Bilirubin 0.3 - 1.2 mg/dL 0.4 0.4 0.6  Alkaline Phos 38 - 126 U/L 97 87 103  AST 15 - 41 U/L 16 14(L) 17  ALT 0 - 44 U/L '15 12 9       '$ DIAGNOSTIC IMAGING:  I have reviewed the scans.     ASSESSMENT & PLAN:   Primary cancer of right upper lobe of lung ([place]) 1.  Stage IV adenocarcinoma lung, PD-L1 50%, other actionable mutations negative: -Keytruda from 05/17/2016 through 09/24/2018 with mild progression. -4 cycles of carboplatin and pemetrexed from 12/25/2018 through 02/23/2019. -Pemetrexed maintenance from 03/24/2019 through 04/12/2019, discontinued secondary to renal insufficiency. -CT chest on 07/08/2019 showed slight decrease in the right upper lobe mass along the major fissure.  Stable appearing small mediastinal lymph nodes.  Stable left adrenal mass. -He reported some worsening of cough.  Clear  expectoration.  Unlikely COPD exacerbation.  We will treat him with Levaquin 500 mg loading dose followed by 250 mg daily.  He is using a regular Robitussin which is not helping.  He has used [employer] in the past without help.  I have suggested him to take Robitussin-DM. -His creatinine is slightly elevated.  He will receive 100 mL of normal saline.  He will proceed with his treatment.  I will reevaluate him in 3 weeks.  2.  Brain metastasis: -SRS to the right parietal lobe lesion with  surrounding edema on 11/24/2018. -MRI of brain on 06/17/2019 did not show any new lesions.  He is scheduled for another brain MRI soon.  3.  Itching rash: -This is from dry skin from immunotherapy.  He will continue to use moisturizing lotion.  4.  Peripheral neuropathy: -He will continue gabapentin 400 mg 3 times daily.  Duloxetine 30 mg daily.  5.  Hypomagnesemia: -Magnesium is normal today.  He will continue magnesium twice daily.  6.  Constipation: -He will continue lactulose twice daily as needed.      Orders placed this encounter:  No orders of the defined types were placed in this encounter.     Derek Jack, MD [place] [phone]

## 2019-09-03 ENCOUNTER — Other Ambulatory Visit (HOSPITAL_COMMUNITY): Payer: Self-pay | Admitting: Hematology

## 2019-09-03 DIAGNOSIS — C3411 Malignant neoplasm of upper lobe, right bronchus or lung: Secondary | ICD-10-CM

## 2019-09-13 DIAGNOSIS — C3411 Malignant neoplasm of upper lobe, right bronchus or lung: Secondary | ICD-10-CM | POA: Diagnosis not present

## 2019-09-13 DIAGNOSIS — I10 Essential (primary) hypertension: Secondary | ICD-10-CM | POA: Diagnosis not present

## 2019-09-13 DIAGNOSIS — E1165 Type 2 diabetes mellitus with hyperglycemia: Secondary | ICD-10-CM | POA: Diagnosis not present

## 2019-09-13 DIAGNOSIS — Z0001 Encounter for general adult medical examination with abnormal findings: Secondary | ICD-10-CM | POA: Diagnosis not present

## 2019-09-13 NOTE — Progress Notes (Signed)

## 2019-09-14 ENCOUNTER — Inpatient Hospital Stay (HOSPITAL_COMMUNITY): Payer: Medicare Other | Admitting: Hematology

## 2019-09-14 ENCOUNTER — Inpatient Hospital Stay (HOSPITAL_COMMUNITY): Payer: Medicare Other

## 2019-09-14 ENCOUNTER — Encounter (HOSPITAL_COMMUNITY): Payer: Self-pay | Admitting: Hematology

## 2019-09-14 ENCOUNTER — Other Ambulatory Visit: Payer: Self-pay

## 2019-09-14 ENCOUNTER — Inpatient Hospital Stay (HOSPITAL_COMMUNITY): Payer: Medicare Other | Attending: Hematology

## 2019-09-14 VITALS — BP 144/84 | HR 88 | Temp 98.0°F | Resp 17

## 2019-09-14 VITALS — BP 142/83 | HR 85 | Temp 96.8°F | Resp 16 | Wt 224.0 lb

## 2019-09-14 DIAGNOSIS — C3411 Malignant neoplasm of upper lobe, right bronchus or lung: Secondary | ICD-10-CM

## 2019-09-14 DIAGNOSIS — Z5112 Encounter for antineoplastic immunotherapy: Secondary | ICD-10-CM | POA: Insufficient documentation

## 2019-09-14 DIAGNOSIS — Z8249 Family history of ischemic heart disease and other diseases of the circulatory system: Secondary | ICD-10-CM | POA: Insufficient documentation

## 2019-09-14 DIAGNOSIS — Z87891 Personal history of nicotine dependence: Secondary | ICD-10-CM | POA: Insufficient documentation

## 2019-09-14 DIAGNOSIS — R21 Rash and other nonspecific skin eruption: Secondary | ICD-10-CM | POA: Insufficient documentation

## 2019-09-14 DIAGNOSIS — Z79899 Other long term (current) drug therapy: Secondary | ICD-10-CM | POA: Diagnosis not present

## 2019-09-14 DIAGNOSIS — C7931 Secondary malignant neoplasm of brain: Secondary | ICD-10-CM | POA: Insufficient documentation

## 2019-09-14 DIAGNOSIS — I10 Essential (primary) hypertension: Secondary | ICD-10-CM | POA: Diagnosis not present

## 2019-09-14 DIAGNOSIS — G629 Polyneuropathy, unspecified: Secondary | ICD-10-CM | POA: Insufficient documentation

## 2019-09-14 DIAGNOSIS — K5900 Constipation, unspecified: Secondary | ICD-10-CM | POA: Diagnosis not present

## 2019-09-14 DIAGNOSIS — E119 Type 2 diabetes mellitus without complications: Secondary | ICD-10-CM | POA: Insufficient documentation

## 2019-09-14 DIAGNOSIS — Z7984 Long term (current) use of oral hypoglycemic drugs: Secondary | ICD-10-CM | POA: Insufficient documentation

## 2019-09-14 DIAGNOSIS — Z86711 Personal history of pulmonary embolism: Secondary | ICD-10-CM | POA: Diagnosis not present

## 2019-09-14 LAB — CBC WITH DIFFERENTIAL/PLATELET
Abs Immature Granulocytes: 0.07 10*3/uL (ref 0.00–0.07)
Basophils Absolute: 0.1 10*3/uL (ref 0.0–0.1)
Basophils Relative: 1 %
Eosinophils Absolute: 1.1 10*3/uL — ABNORMAL HIGH (ref 0.0–0.5)
Eosinophils Relative: 9 %
HCT: 42.9 % (ref 39.0–52.0)
Hemoglobin: 13.5 g/dL (ref 13.0–17.0)
Immature Granulocytes: 1 %
Lymphocytes Relative: 40 %
Lymphs Abs: 5.2 10*3/uL — ABNORMAL HIGH (ref 0.7–4.0)
MCH: 29 pg (ref 26.0–34.0)
MCHC: 31.5 g/dL (ref 30.0–36.0)
MCV: 92.1 fL (ref 80.0–100.0)
Monocytes Absolute: 1.1 10*3/uL — ABNORMAL HIGH (ref 0.1–1.0)
Monocytes Relative: 8 %
Neutro Abs: 5.2 10*3/uL (ref 1.7–7.7)
Neutrophils Relative %: 41 %
Platelets: 239 10*3/uL (ref 150–400)
RBC: 4.66 MIL/uL (ref 4.22–5.81)
RDW: 14.2 % (ref 11.5–15.5)
WBC: 12.9 10*3/uL — ABNORMAL HIGH (ref 4.0–10.5)
nRBC: 0 % (ref 0.0–0.2)

## 2019-09-14 LAB — MAGNESIUM: Magnesium: 2.1 mg/dL (ref 1.7–2.4)

## 2019-09-14 LAB — COMPREHENSIVE METABOLIC PANEL
ALT: 15 U/L (ref 0–44)
AST: 17 U/L (ref 15–41)
Albumin: 4 g/dL (ref 3.5–5.0)
Alkaline Phosphatase: 95 U/L (ref 38–126)
Anion gap: 12 (ref 5–15)
BUN: 23 mg/dL (ref 8–23)
CO2: 27 mmol/L (ref 22–32)
Calcium: 9.2 mg/dL (ref 8.9–10.3)
Chloride: 102 mmol/L (ref 98–111)
Creatinine, Ser: 1.43 mg/dL — ABNORMAL HIGH (ref 0.61–1.24)
GFR calc Af Amer: 56 mL/min — ABNORMAL LOW (ref >60)
GFR calc non Af Amer: 48 mL/min — ABNORMAL LOW (ref >60)
Glucose, Bld: 122 mg/dL — ABNORMAL HIGH (ref 70–99)
Potassium: 4.4 mmol/L (ref 3.5–5.1)
Sodium: 141 mmol/L (ref 135–145)
Total Bilirubin: 0.5 mg/dL (ref 0.3–1.2)
Total Protein: 7.2 g/dL (ref 6.5–8.1)

## 2019-09-14 MED ORDER — SODIUM CHLORIDE 0.9 % IV SOLN
200.0000 mg | Freq: Once | INTRAVENOUS | Status: AC
  Administered 2019-09-14: 200 mg via INTRAVENOUS
  Filled 2019-09-14: qty 8

## 2019-09-14 MED ORDER — SODIUM CHLORIDE 0.9 % IV SOLN: Freq: Once | INTRAVENOUS | Status: AC

## 2019-09-14 MED ORDER — SODIUM CHLORIDE 0.9% FLUSH
10.0000 mL | INTRAVENOUS | Status: DC | PRN
  Administered 2019-09-14: 10 mL

## 2019-09-14 MED ORDER — HEPARIN SOD (PORK) LOCK FLUSH 100 UNIT/ML IV SOLN
500.0000 [IU] | Freq: Once | INTRAVENOUS | Status: AC | PRN
  Administered 2019-09-14: 500 [IU]

## 2019-09-14 NOTE — Progress Notes (Signed)
Patient has been assessed, vital signs and labs have been reviewed by Dr. Katragadda. ANC, Creatinine, LFTs, and Platelets are within treatment parameters per Dr. Katragadda. The patient is good to proceed with treatment at this time.  

## 2019-09-14 NOTE — Assessment & Plan Note (Signed)
1.  Stage IV adenocarcinoma lung, PD-L1 50%, other actionable mutations negative: -CT chest on 07/08/2019 shows slight decrease in the right upper lobe lung mass along the major fissure. -He is tolerating pembrolizumab very well.  No immunotherapy related side effects. -I have reviewed labs today.  He will proceed with Keytruda. -I plan to see him back in 3 weeks.  I plan to repeat CT of the chest with contrast.  2.  Brain metastasis: -SRS to the right parietal lobe lesion with surrounding edema on 11/24/2018. -MRI of the brain on 06/17/2019 did not show any new lesions.  He scheduled for another mammogram on 09/16/2019.  3.  Itching rash: -He has a multiple scratch marks on bilateral shins.  This is partly from dry skin from immunotherapy and fluid retention.  He will continue to apply moisturizing lotion.  4.  Peripheral neuropathy: -Continue gabapentin 400 mg 3 times a day and duloxetine 30 mg daily.  5.  Hypomagnesemia: -Magnesium is normal today.  Continue magnesium twice daily.

## 2019-09-14 NOTE — Progress Notes (Signed)
[place], [place] [address]   CLINIC:  Medical Oncology/Hematology  PCP:  Celene Squibb, MD [place] [address] [phone]   REASON FOR VISIT:  Follow-up for stage IV adenocarcinoma of the right lung   BRIEF ONCOLOGIC HISTORY:  Oncology History  Primary cancer of right upper lobe of lung ([place])  05/14/2015 Procedure   Port placement by Dr. Arnoldo Morale   04/01/2016 Initial Diagnosis   Primary cancer of right upper lobe of lung ([place])   04/02/2016 Procedure   Ultrasound-guided core biopsy performed of a solid 1.5 cm soft tissue nodule in the right chest wall.   04/03/2016 Imaging   MRI brain- No acute and intracranial process or metastasis.  Moderate chronic small vessel ischemic disease.   04/04/2016 Pathology Results   Diagnosis Soft Tissue Needle Core Biopsy, Right Chest Wall SMOOTH MUSCLE NEOPLASM Microscopic Comment The differential diagnosis include low grade leiomyosarcoma. The neoplasm shows spindle cell morphology with rare mitotic figures and minimal atypia, no necrosis present. These cells stains positive for smooth muscle actin, desmin, negative for cytokeratin AE1&3, ck8/18, s100, cd117, cd 34 and cd99. This case also reviewed by Dr. [name] and agree.   04/05/2016 Procedure   CT-guided biopsy of right upper lobe lesion, with tissue specimen sent to pathology, by IR.   04/09/2016 Pathology Results   Lung, needle/core biopsy(ies), Right Upper Lobe - NON SMALL CELL CARCINOMA.   04/17/2016 Pathology Results   PDL1 High Expression.  TPS 50%.   04/24/2016 PET scan   1. Prominently hypermetabolic large right upper lobe mass with metastatic disease to the right paratracheal and right hilar nodal chains, to the descending mesocolon, to the left upper buttock subcutaneous tissues, to the left upper lobe, to the right subscapularis muscle, and possibly to the left adrenal gland and right breast  subcutaneous tissues. Appearance compatible with stage IV lung cancer. 2. There is some focal high activity in the ascending colon which is probably from peristalsis, less likely from a local colon mass. This does raise the possibility that the adjacent mesocolon tumor implant could be due to synchronous colon cancer rather than lung metastasis. 3. Other imaging findings of potential clinical significance: Coronary, aortic arch, and branch vessel atherosclerotic vascular disease. Aortoiliac atherosclerotic vascular disease. Deformity left proximal humerus and left glenohumeral joint from prior trauma. Enlarged prostate gland.   04/26/2016 Pathology Results   FoundationONE: Genomic alterations identified- KRAS F16B, CREBBP splice site [id number], LRP1B S515*, JTT01 X79*, TJQ30 splice site [id number], SPTA1 splice site [id number], TP53 C135Y.  Additional findings- MS-Stable, TMB-intermediate (14 Muts/Mb).  No reportable alterations- EGFR, ALK, BRAF, MET, ERBB2, RET, ROS1.   05/17/2016 -  Chemotherapy   The patient had ONDANSETRON IVPB [facility] +/- DEXAMETHASONE, , Intravenous,  Once, 0 of 1 cycle  pembrolizumab (KEYTRUDA) 200 mg in sodium chloride 0.9 % 50 mL chemo infusion, 200 mg, Intravenous, Once, 0 of 5 cycles  for chemotherapy treatment.     08/06/2016 PET scan   IMPRESSION: 1. Overall considerable improvement. The right upper lobe mass is markedly reduced in volume and SUV. Thoracic adenopathy have resolved and the hypermetabolic lesion in the right subscapularis muscle has also resolved. 2. The left upper lobe nodule is no longer appreciably hypermetabolic, and is highly indistinct although this may be due to motion artifact. Faint in indistinct nodularity elsewhere in the lungs likewise not currently hypermetabolic. 3. There is some very faint residual low-grade activity along the subcutaneous deposit  along the upper left buttock region. Marked reduction in size and activity of  the tumor deposit adjacent to the descending colon. 4. Prior right pleural effusion has resolved. 5. Stable appearance of the adrenal glands, with low-level activity and a nodule in the left adrenal gland. 6. Stable small nodule in the right breast, low-grade metabolic activity with maximum SUV 2.2 (formerly 3.0) 7. Other imaging findings of potential clinical significance: Severe arthropathy of the left glenohumeral joint. Coronary, aortic arch, and branch vessel atherosclerotic vascular disease. Aortoiliac atherosclerotic vascular disease. Prominent prostate gland indents the bladder base.    12/16/2018 -  Chemotherapy   The patient had palonosetron (ALOXI) injection 0.25 mg, 0.25 mg, Intravenous,  Once, 4 of 4 cycles Administration: 0.25 mg (12/16/2018), 0.25 mg (01/27/2019), 0.25 mg (03/03/2019), 0.25 mg (01/06/2019) pegfilgrastim (NEULASTA) injection 6 mg, 6 mg, Subcutaneous, Once, 2 of 2 cycles Administration: 6 mg (01/08/2019) pegfilgrastim (NEULASTA ONPRO KIT) injection 6 mg, 6 mg, Subcutaneous, Once, 2 of 2 cycles pegfilgrastim-cbqv (UDENYCA) injection 6 mg, 6 mg, Subcutaneous, Once, 2 of 2 cycles Administration: 6 mg (01/29/2019), 6 mg (03/05/2019) ondansetron (ZOFRAN) 8 mg in sodium chloride 0.9 % 50 mL IVPB, 8 mg (100 % of original dose 8 mg), Intravenous,  Once, 3 of 3 cycles Dose modification: 8 mg (original dose 8 mg, Cycle 5) Administration: 8 mg (03/24/2019), 8 mg (04/16/2019) PEMEtrexed (ALIMTA) 1,100 mg in sodium chloride 0.9 % 100 mL chemo infusion, 490 mg/m2 = 1,125 mg, Intravenous,  Once, 6 of 6 cycles Dose modification: 400 mg/m2 (80 % of original dose 500 mg/m2, Cycle 3, Reason: Other (see comments), Comment: rash, swelling) Administration: 1,100 mg (12/16/2018), 900 mg (01/27/2019), 900 mg (03/03/2019), 900 mg (03/24/2019), 900 mg (04/16/2019), 1,100 mg (01/06/2019) CARBOplatin (PARAPLATIN) 500 mg in sodium chloride 0.9 % 250 mL chemo infusion, 530 mg (110.7 % of original  dose 477.5 mg), Intravenous,  Once, 4 of 4 cycles Dose modification:   (original dose 477.5 mg, Cycle 1),   (original dose 485.5 mg, Cycle 3),   (original dose 494 mg, Cycle 4),   (original dose 494 mg, Cycle 2) Administration: 500 mg (12/16/2018), 490 mg (01/27/2019), 490 mg (03/03/2019), 490 mg (01/06/2019) pembrolizumab (KEYTRUDA) 200 mg in sodium chloride 0.9 % 50 mL chemo infusion, 2 mg/kg = 200 mg, Intravenous, Once, 8 of 10 cycles Dose modification: 200 mg (original dose 2 mg/kg, Cycle 14, Reason: Provider Judgment) Administration: 200 mg (04/16/2019), 200 mg (05/31/2019), 200 mg (06/22/2019), 200 mg (07/13/2019), 200 mg (05/10/2019), 200 mg (08/03/2019), 200 mg (08/24/2019), 200 mg (09/14/2019) fosaprepitant (EMEND) 150 mg, dexamethasone (DECADRON) 12 mg in sodium chloride 0.9 % 145 mL IVPB, , Intravenous,  Once, 4 of 4 cycles Administration:  (12/16/2018),  (01/27/2019),  (03/03/2019),  (01/06/2019)  for chemotherapy treatment.    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  Two adjacent large masslike areas of consolidation within the right upper lobe with differential considerations including malignancy or pneumonia. Multiple enlarged right hilar and mediastinal lymph nodes which may be reactive or metastatic in etiology.  Additional indeterminate pulmonary nodules as above. Recommend attention on follow-up.  Indeterminate 2.9 cm left adrenal nodule. This needs dedicated evaluation with pre and post contrast-enhanced CT or MRI after confirmation of pulmonary process.  Indeterminate nodule within the right breast. Recommend dedicated evaluation with mammography.  Hepatic steatosis.   04/02/2016 Procedure   Ultrasound-guided core biopsy  performed of a solid 1.5 cm soft tissue nodule in the right chest wall.   04/04/2016 Pathology Results   Diagnosis Soft  Tissue Needle Core Biopsy, Right Chest Wall SMOOTH MUSCLE NEOPLASM Microscopic Comment The differential diagnosis include low grade leiomyosarcoma. The neoplasm shows spindle cell morphology with rare mitotic figures and minimal atypia, no necrosis present. These cells stains positive for smooth muscle actin, desmin, negative for cytokeratin AE1&3, ck8/18, s100, cd117, cd 34 and cd99. This case also reviewed by Dr. [name] and agree.       CANCER STAGING: Cancer Staging Primary cancer of right upper lobe of lung ([place]) Staging form: Lung, AJCC 8th Edition - Clinical: Stage IVB (cT3, cN2, cM1c) - Signed by Baird Cancer, PA-C on 05/07/2016    INTERVAL HISTORY:  Darrell Christensen 74 y.o. male seen for follow-up of adenocarcinoma of the lung and toxicity assessment prior to next cycle of immunotherapy.  Appetite is 100%.  Energy levels are 50%.  He is accompanied by his daughter.  Itching of the dry skin present on both shins.  He reports shortness of breath on exertion which is stable.  Constipation is managed with lactulose.  Numbness in the feet is also stable.  REVIEW OF SYSTEMS:  Review of Systems  Respiratory: Positive for shortness of breath.   Gastrointestinal: Positive for constipation.  Skin: Positive for itching.  Neurological: Positive for numbness.  All other systems reviewed and are negative.    PAST MEDICAL/SURGICAL HISTORY:  Past Medical History:  Diagnosis Date  . Adrenal mass, left ([place]) 04/01/2016  . Diabetes mellitus ([place]) 05/02/2016  . Diabetes mellitus without complication ([place])   . Hypertension   . Mass of breast, right 04/01/2016  . Mass of upper lobe of right lung 04/01/2016   2 adjacent, 5 cm masses, hilar adenopathy  . Primary cancer of right upper lobe of lung ([place]) 04/01/2016   2 adjacent, 5 cm masses, hilar adenopathy  . Pulmonary embolus ([place]) 03/31/2016  . 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  COLONOSCOPY N/A 07/28/2017   Procedure: COLONOSCOPY;  Surgeon: Rogene Houston, MD;  Location: [facility] ENDO SUITE;  Service: Endoscopy;  Laterality: N/A;  205  . PORTACATH PLACEMENT Left 05/13/2016   Procedure: INSERTION PORT-A-CATH;  Surgeon: Aviva Signs, MD;  Location: [facility] ORS;  Service: General;  Laterality: Left;     SOCIAL HISTORY:  Social History   Socioeconomic History  . Marital status: Divorced    Spouse name: Not on file  . Number of children: Not on file  . Years of education: Not on file  . Highest education level: Not on file  Occupational History  . Not on file  Tobacco Use  . Smoking status: Former Smoker    Years: 61.00    Quit date: 04/11/2014    Years since quitting: 5.4  . Smokeless tobacco: Never Used  . Tobacco comment: patient does vape smoking x 2 years  Substance and Sexual Activity  . Alcohol use: Not Currently    Comment: quit 10 years  . Drug use: No  . Sexual activity: Not on file  Other Topics Concern  . Not on file  Social History Narrative  . Not on file   Social Determinants of Health   Financial Resource Strain:   . Difficulty of Paying Living Expenses:   Food Insecurity:   . Worried About Charity fundraiser in the Last Year:   . [employer]  of Food in the Last Year:   Transportation Needs: No Transportation Needs  . Lack of Transportation (Medical): No  . Lack of Transportation (Non-Medical): No  Physical Activity:   . Days of Exercise per Week:   . Minutes of Exercise per Session:   Stress:   . Feeling of Stress :   Social Connections:   . Frequency of Communication with Friends and Family:   . Frequency of Social Gatherings with Friends and Family:   . Attends Religious Services:   . Active Member of Clubs or Organizations:   . Attends Archivist Meetings:   Marland Kitchen Marital Status:   Intimate Partner Violence:   . Fear of Current or Ex-Partner:   . Emotionally Abused:   Marland Kitchen Physically Abused:   . Sexually Abused:     FAMILY  HISTORY:  Family History  Problem Relation Age of Onset  . Stroke Mother   . Heart attack Father   . Heart attack Brother     CURRENT MEDICATIONS:  Outpatient Encounter Medications as of 09/14/2019  Medication Sig  . ALPRAZolam (XANAX) 1 MG tablet One tab po 30 minutes prior to MRI or radiation  . amLODipine (NORVASC) 2.5 MG tablet Take 2.5 mg by mouth daily.  Marland Kitchen CARBOPLATIN IV Inject into the vein every 21 ( twenty-one) days.  . DULoxetine (CYMBALTA) 60 MG capsule Take 60 mg by mouth daily.  . folic acid (FOLVITE) 1 MG tablet TAKE ONE TABLET BY MOUTH ONCE DAILY. START 5-7 DAYS BEFORE ALIMTA CHEMOTHERAPY AND CONTINUE FOR 21 DAYS AFTER CHEMOTHERAPY.  . furosemide (LASIX) 20 MG tablet Take 2 tablets (40 mg total) by mouth daily.  Marland Kitchen gabapentin (NEURONTIN) 400 MG capsule Take 1 capsule (400 mg total) by mouth 3 (three) times daily.  Marland Kitchen GLIPIZIDE XL 5 MG 24 hr tablet Take 5 mg by mouth daily.  . hydrocortisone 2.5 % lotion Apply topically as needed.   . lactulose (CHRONULAC) 10 GM/15ML solution Take 30 mLs (20 g total) by mouth at bedtime as needed for mild constipation.  Marland Kitchen levocetirizine (XYZAL) 5 MG tablet Take 5 mg by mouth at bedtime.  . lidocaine-prilocaine (EMLA) cream APPLY A QUARTER SIZE AMOUNT TO THE AFFECTED AREA 1 HOUR PRIOR TO COMING TO CHEMOTHERAPY. COVER WITH A PLASTIC WRAP.  . magnesium oxide (MAG-OX) 400 (241.3 Mg) MG tablet Take 1 tablet (400 mg total) by mouth 2 (two) times daily.  . metFORMIN (GLUCOPHAGE) 500 MG tablet Take 500 mg by mouth 2 (two) times daily.  . metoprolol succinate (TOPROL-XL) 25 MG 24 hr tablet TAKE ONE TABLET BY MOUTH IN THE EVENING.  Marland Kitchen ondansetron (ZOFRAN) 4 MG tablet Take 1 tablet (4 mg total) by mouth every 8 (eight) hours as needed for nausea or vomiting.  Marland Kitchen oxyCODONE-acetaminophen (PERCOCET) 10-325 MG tablet Take 1 tablet by mouth as needed.   . Pembrolizumab (KEYTRUDA IV) Inject into the vein.  Marland Kitchen PEMEtrexed 500 mg/m2 in sodium chloride 0.9 % 100 mL  Inject into the vein every 21 ( twenty-one) days.  . potassium chloride (K-DUR,KLOR-CON) 10 MEQ tablet Take 10 mEq by mouth daily.   . prochlorperazine (COMPAZINE) 10 MG tablet Take 1 tablet (10 mg total) by mouth every 6 (six) hours as needed (Nausea or vomiting).  . simvastatin (ZOCOR) 20 MG tablet Take 20 mg by mouth every evening.  . torsemide (DEMADEX) 20 MG tablet Take 1 tablet (20 mg total) by mouth daily.  . [DISCONTINUED] DULoxetine (CYMBALTA) 30 MG capsule Take 30 mg by  mouth daily.  . [DISCONTINUED] levofloxacin (LEVAQUIN) 250 MG tablet Take 1 tablet (250 mg total) by mouth daily. Take 2 tabs on day1, then 1 tablet daily   No facility-administered encounter medications on file as of 09/14/2019.    ALLERGIES:  No Known Allergies   PHYSICAL EXAM:  ECOG Performance status: 1  Vitals:   09/14/19 1000  BP: (!) 142/83  Pulse: 85  Resp: 16  Temp: (!) 96.8 F (36 C)  SpO2: 95%   Filed Weights   09/14/19 1000  Weight: 224 lb (101.6 kg)    Physical Exam Vitals reviewed.  Constitutional:      Appearance: Normal appearance.  Cardiovascular:     Rate and Rhythm: Normal rate and regular rhythm.     Heart sounds: Normal heart sounds.  Pulmonary:     Effort: Pulmonary effort is normal.     Breath sounds: Normal breath sounds.  Abdominal:     General: There is no distension.     Palpations: Abdomen is soft. There is no mass.  Musculoskeletal:        General: No swelling.  Skin:    General: Skin is warm.  Neurological:     General: No focal deficit present.     Mental Status: He is alert and oriented to person, place, and time.  Psychiatric:        Mood and Affect: Mood normal.        Behavior: Behavior normal.      LABORATORY DATA:  I have reviewed the labs as listed.  CBC    Component Value Date/Time   WBC 12.9 (H) 09/14/2019 0837   RBC 4.66 09/14/2019 0837   HGB 13.5 09/14/2019 0837   HGB 13.5 05/02/2016 0852   HCT 42.9 09/14/2019 0837   HCT 41.0  05/02/2016 0852   PLT 239 09/14/2019 0837   PLT 436 (H) 05/02/2016 0852   MCV 92.1 09/14/2019 0837   MCV 85.7 05/02/2016 0852   MCH 29.0 09/14/2019 0837   MCHC 31.5 09/14/2019 0837   RDW 14.2 09/14/2019 0837   RDW 13.9 05/02/2016 0852   LYMPHSABS 5.2 (H) 09/14/2019 0837   LYMPHSABS 3.4 (H) 05/02/2016 0852   MONOABS 1.1 (H) 09/14/2019 0837   MONOABS 1.2 (H) 05/02/2016 0852   EOSABS 1.1 (H) 09/14/2019 0837   EOSABS 1.6 (H) 05/02/2016 0852   BASOSABS 0.1 09/14/2019 0837   BASOSABS 0.2 (H) 05/02/2016 0852   CMP Latest Ref Rng & Units 09/14/2019 08/24/2019 08/03/2019  Glucose 70 - 99 mg/dL 122(H) 176(H) 130(H)  BUN 8 - 23 mg/dL 23 25(H) 18  Creatinine 0.61 - 1.24 mg/dL 1.43(H) 1.56(H) 1.49(H)  Sodium 135 - 145 mmol/L 141 142 141  Potassium 3.5 - 5.1 mmol/L 4.4 4.0 4.5  Chloride 98 - 111 mmol/L 102 102 103  CO2 22 - 32 mmol/L _0 Calcium 8.9 - 10.3 mg/dL 9.2 9.3 9.0  Total Protein 6.5 - 8.1 g/dL 7.2 7.2 7.0  Total Bilirubin 0.3 - 1.2 mg/dL 0.5 0.4 0.4  Alkaline Phos 38 - 126 U/L 95 97 87  AST 15 - 41 U/L 17 16 14(L)  ALT 0 - 44 U/L _1 DIAGNOSTIC IMAGING:  I have reviewed the scans.     ASSESSMENT & PLAN:   Primary cancer of right upper lobe of lung ([place]) 1.  Stage IV adenocarcinoma lung, PD-L1 50%, other actionable mutations negative: -CT chest on 07/08/2019 shows slight decrease in the  right upper lobe lung mass along the major fissure. -He is tolerating pembrolizumab very well.  No immunotherapy related side effects. -I have reviewed labs today.  He will proceed with Keytruda. -I plan to see him back in 3 weeks.  I plan to repeat CT of the chest with contrast.  2.  Brain metastasis: -SRS to the right parietal lobe lesion with surrounding edema on 11/24/2018. -MRI of the brain on 06/17/2019 did not show any new lesions.  He scheduled for another mammogram on 09/16/2019.  3.  Itching rash: -He has a multiple scratch marks on bilateral shins.  This is  partly from dry skin from immunotherapy and fluid retention.  He will continue to apply moisturizing lotion.  4.  Peripheral neuropathy: -Continue gabapentin 400 mg 3 times a day and duloxetine 30 mg daily.  5.  Hypomagnesemia: -Magnesium is normal today.  Continue magnesium twice daily.      Orders placed this encounter:  Orders Placed This Encounter  Procedures  . CT Chest W Contrast      Derek Jack, MD [place] [phone]

## 2019-09-14 NOTE — Patient Instructions (Signed)
[facility] Discharge Instructions for Patients Receiving Chemotherapy  Today you received the following chemotherapy agents   To help prevent nausea and vomiting after your treatment, we encourage you to take your nausea medication   If you develop nausea and vomiting that is not controlled by your nausea medication, call the clinic.   BELOW ARE SYMPTOMS THAT SHOULD BE REPORTED IMMEDIATELY:  *FEVER GREATER THAN 100.5 F  *CHILLS WITH OR WITHOUT FEVER  NAUSEA AND VOMITING THAT IS NOT CONTROLLED WITH YOUR NAUSEA MEDICATION  *UNUSUAL SHORTNESS OF BREATH  *UNUSUAL BRUISING OR BLEEDING  TENDERNESS IN MOUTH AND THROAT WITH OR WITHOUT PRESENCE OF ULCERS  *URINARY PROBLEMS  *BOWEL PROBLEMS  UNUSUAL RASH Items with * indicate a potential emergency and should be followed up as soon as possible.  Feel free to call the clinic should you have any questions or concerns. The clinic phone number is [phone].  Please show the CHEMO ALERT CARD at check-in to the Emergency Department and triage nurse.   

## 2019-09-14 NOTE — Patient Instructions (Addendum)
[place] at [facility] Discharge Instructions  You were seen today by Dr. Delton Coombes. He went over your recent lab results. He will see you back in 3 weeks for labs, scan, treatment and follow up.   Thank you for choosing [place] at [facility] to provide your oncology and hematology care.  To afford each patient quality time with our provider, please arrive at least 15 minutes before your scheduled appointment time.   If you have a lab appointment with the [place] please come in thru the  Main Entrance and check in at the main information desk  You need to re-schedule your appointment should you arrive 10 or more minutes late.  We strive to give you quality time with our providers, and arriving late affects you and other patients whose appointments are after yours.  Also, if you no show three or more times for appointments you may be dismissed from the clinic at the providers discretion.     Again, thank you for choosing [facility].  Our hope is that these requests will decrease the amount of time that you wait before being seen by our physicians.       _____________________________________________________________  Should you have questions after your visit to [facility], please contact our office at [phone] between the hours of 8:00 a.m. and 4:30 p.m.  Voicemails left after 4:00 p.m. will not be returned until the following business day.  For prescription refill requests, have your pharmacy contact our office and allow 72 hours.    Cancer Center Support Programs:   > Cancer Support Group  2nd Tuesday of the month 1pm-2pm, Journey Room

## 2019-09-14 NOTE — Progress Notes (Signed)
Patient presents today for treatment and follow up visit with Dr. Delton Coombes. Vital signs within parameters for treatment today. Labs within parameters for treatment.   Message received from [facility] LPN/ Dr. Delton Coombes. Proceed with treatment.   Treatment given today per MD orders. Tolerated infusion without adverse affects. Vital signs stable. No complaints at this time. Discharged from clinic ambulatory. F/U with [facility] as scheduled.

## 2019-09-16 ENCOUNTER — Ambulatory Visit (HOSPITAL_COMMUNITY)
Admission: RE | Admit: 2019-09-16 | Discharge: 2019-09-16 | Disposition: A | Discharge status: Home / Self Care | Payer: Medicare Other | Source: Ambulatory Visit | Attending: Radiation Oncology | Admitting: Radiation Oncology

## 2019-09-16 ENCOUNTER — Other Ambulatory Visit: Payer: Self-pay

## 2019-09-16 DIAGNOSIS — C7931 Secondary malignant neoplasm of brain: Secondary | ICD-10-CM

## 2019-09-16 MED ORDER — GADOBUTROL 1 MMOL/ML IV SOLN
10.0000 mL | Freq: Once | INTRAVENOUS | Status: AC | PRN
  Administered 2019-09-16: 10 mL via INTRAVENOUS

## 2019-09-20 ENCOUNTER — Ambulatory Visit
Admission: RE | Admit: 2019-09-20 | Discharge: 2019-09-20 | Disposition: A | Discharge status: Home / Self Care | Payer: Medicare Other | Source: Ambulatory Visit | Attending: Radiation Oncology | Admitting: Radiation Oncology

## 2019-09-20 ENCOUNTER — Other Ambulatory Visit: Payer: Self-pay

## 2019-09-20 ENCOUNTER — Encounter: Payer: Self-pay | Admitting: Radiation Oncology

## 2019-09-20 ENCOUNTER — Inpatient Hospital Stay: Payer: Medicare Other | Attending: Radiation Oncology

## 2019-09-20 DIAGNOSIS — C7931 Secondary malignant neoplasm of brain: Secondary | ICD-10-CM | POA: Diagnosis not present

## 2019-09-20 DIAGNOSIS — Z85118 Personal history of other malignant neoplasm of bronchus and lung: Secondary | ICD-10-CM | POA: Diagnosis not present

## 2019-09-20 DIAGNOSIS — Z08 Encounter for follow-up examination after completed treatment for malignant neoplasm: Secondary | ICD-10-CM | POA: Diagnosis not present

## 2019-09-20 DIAGNOSIS — C3411 Malignant neoplasm of upper lobe, right bronchus or lung: Secondary | ICD-10-CM

## 2019-09-20 NOTE — Progress Notes (Signed)
Radiation Oncology         [phone] ________________________________  Outpatient Follow Up - Conducted via telephone due to current COVID-19 concerns for limiting patient exposure  I spoke with the patient to conduct this consult visit via telephone to spare the patient unnecessary potential exposure in the healthcare setting during the current COVID-19 pandemic. The patient was notified in advance and was offered a [place] meeting to allow for face to face communication but unfortunately reported that they did not have the appropriate resources/technology to support such a visit and instead preferred to proceed with a telephone visit. ________________________________  Name: Darrell Christensen MRN: [id number]  Date of Service: 09/20/2019  DOB: 04-20-1946   Diagnosis:   Progressive Metastatic Stage IV NSCLC, adenocarcinoma of the RUL with new brain metastasis.  Interval Since Last Radiation: 10  months  11/18/2018-11/24/2018 S[facility] Treatment:  PTV1 Left Parietal 17 mm received 27 Gy in 3 fractions  Narrative:  Darrell Christensen is a pleasant 74 y.o. gentleman with a history of Stage IV, NSCLC, adenocarcinoma of the RUL.  He presented in 2017 with stage IV disease and received Keytruda between January 2018 and Sep 24, 2018 when he was found to have progressive disease in the right upper lobe.  He was seen by Dr. Julien Nordmann for a second opinion and was encouraged to consider chemoimmunotherapy for 4 cycles with carboplatin and pemetrexed along with his Keytruda.  He was in the midst of getting this authorized, and presented with slurred speech after a fall.  He had been having approximately 3 weeks of headaches as well, and was sent for an MRI of the brain yesterday.  He received 1 mg of IV Ativan through his Port-A-Cath.  His scan revealed a 22 mm brain metastasis in the posterior aspect of the right temporal lobe with significant vasogenic edema in the right hemisphere.  Only mild regional mass-effect was noted  without any midline shift.  He proceeded with stereotactic radiosurgery in 3 fractions which he completed in July 2020.  He has since been on systemic therapy with Dr. Delton Coombes and he has been receiving Keytruda and will have restaging imaging in the next few weeks. He had a recent MRI of the brain on 09/16/19 which revealed post treatment changes in the right parietal region and no new disease. He's contacted to review these results.    On review of systems, the patient reports that he is doing well overall. The patient reports he is not having any problems with a dry cough any longer. He reports his skin is better than it had been last time he saw Dr. Delton Coombes. He denies any chest pain, shortness of breath,  fevers, chills, night sweats, unintended weight changes. He denies any headaches or changes in gait, visual or auditory dysfunctions. He denies any bowel or bladder disturbances, and denies abdominal pain, nausea or vomiting. He denies any new musculoskeletal or joint aches or pains, new skin lesions or concerns. A complete review of systems is obtained and is otherwise negative.  Past Medical History:  Past Medical History:  Diagnosis Date  . Adrenal mass, left ([place]) 04/01/2016  . Diabetes mellitus ([place]) 05/02/2016  . Diabetes mellitus without complication ([place])   . Hypertension   . Mass of breast, right 04/01/2016  . Mass of upper lobe of right lung 04/01/2016   2 adjacent, 5 cm masses, hilar adenopathy  . Primary cancer of right upper lobe of lung ([place]) 04/01/2016   2 adjacent, 5 cm masses,  hilar adenopathy  . Pulmonary embolus ([place]) 03/31/2016  . 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 COLONOSCOPY N/A 07/28/2017   Procedure: COLONOSCOPY;  Surgeon: Rogene Houston, MD;  Location: [facility] ENDO SUITE;  Service: Endoscopy;  Laterality: N/A;  205  . PORTACATH PLACEMENT Left 05/13/2016   Procedure: INSERTION PORT-A-CATH;   Surgeon: Aviva Signs, MD;  Location: [facility] ORS;  Service: General;  Laterality: Left;    Social History:  Social History   Socioeconomic History  . Marital status: Divorced    Spouse name: Not on file  . Number of children: Not on file  . Years of education: Not on file  . Highest education level: Not on file  Occupational History  . Not on file  Tobacco Use  . Smoking status: Former Smoker    Years: 61.00    Quit date: 04/11/2014    Years since quitting: 5.4  . Smokeless tobacco: Never Used  . Tobacco comment: patient does vape smoking x 2 years  Substance and Sexual Activity  . Alcohol use: Not Currently    Comment: quit 10 years  . Drug use: No  . Sexual activity: Not on file  Other Topics Concern  . Not on file  Social History Narrative  . Not on file   Social Determinants of Health   Financial Resource Strain:   . Difficulty of Paying Living Expenses:   Food Insecurity:   . Worried About Charity fundraiser in the Last Year:   . Arboriculturist in the Last Year:   Transportation Needs: No Transportation Needs  . Lack of Transportation (Medical): No  . Lack of Transportation (Non-Medical): No  Physical Activity:   . Days of Exercise per Week:   . Minutes of Exercise per Session:   Stress:   . Feeling of Stress :   Social Connections:   . Frequency of Communication with Friends and Family:   . Frequency of Social Gatherings with Friends and Family:   . Attends Religious Services:   . Active Member of Clubs or Organizations:   . Attends Archivist Meetings:   Marland Kitchen Marital Status:   Intimate Partner Violence:   . Fear of Current or Ex-Partner:   . Emotionally Abused:   Marland Kitchen Physically Abused:   . Sexually Abused:   The patient is divorced and lives in [place].   Family History: Family History  Problem Relation Age of Onset  . Stroke Mother   . Heart attack Father   . Heart attack Brother    Medications: Current Outpatient Medications    Medication Sig Dispense Refill  . ALPRAZolam (XANAX) 1 MG tablet One tab po 30 minutes prior to MRI or radiation 30 tablet 0  . amLODipine (NORVASC) 2.5 MG tablet Take 2.5 mg by mouth daily.  4  . CARBOPLATIN IV Inject into the vein every 21 ( twenty-one) days.    . DULoxetine (CYMBALTA) 60 MG capsule Take 60 mg by mouth daily.    . folic acid (FOLVITE) 1 MG tablet TAKE ONE TABLET BY MOUTH ONCE DAILY. START 5-7 DAYS BEFORE ALIMTA CHEMOTHERAPY AND CONTINUE FOR 21 DAYS AFTER CHEMOTHERAPY. 100 tablet 0  . furosemide (LASIX) 20 MG tablet Take 2 tablets (40 mg total) by mouth daily. 20 tablet 0  . gabapentin (NEURONTIN) 400 MG capsule Take 1 capsule (400 mg total) by mouth 3 (three) times daily. 90 capsule 3  .  GLIPIZIDE XL 5 MG 24 hr tablet Take 5 mg by mouth daily.  4  . hydrocortisone 2.5 % lotion Apply topically as needed.     . lactulose (CHRONULAC) 10 GM/15ML solution Take 30 mLs (20 g total) by mouth at bedtime as needed for mild constipation. 480 mL 2  . levocetirizine (XYZAL) 5 MG tablet Take 5 mg by mouth at bedtime.    . lidocaine-prilocaine (EMLA) cream APPLY A QUARTER SIZE AMOUNT TO THE AFFECTED AREA 1 HOUR PRIOR TO COMING TO CHEMOTHERAPY. COVER WITH A PLASTIC WRAP. 30 g 0  . magnesium oxide (MAG-OX) 400 (241.3 Mg) MG tablet Take 1 tablet (400 mg total) by mouth 2 (two) times daily. 60 tablet 2  . metFORMIN (GLUCOPHAGE) 500 MG tablet Take 500 mg by mouth 2 (two) times daily.  4  . metoprolol succinate (TOPROL-XL) 25 MG 24 hr tablet TAKE ONE TABLET BY MOUTH IN THE EVENING. 30 tablet 0  . ondansetron (ZOFRAN) 4 MG tablet Take 1 tablet (4 mg total) by mouth every 8 (eight) hours as needed for nausea or vomiting. 4 tablet 0  . oxyCODONE-acetaminophen (PERCOCET) 10-325 MG tablet Take 1 tablet by mouth as needed.   0  . Pembrolizumab (KEYTRUDA IV) Inject into the vein.    Marland Kitchen PEMEtrexed 500 mg/m2 in sodium chloride 0.9 % 100 mL Inject into the vein every 21 ( twenty-one) days.    . potassium  chloride (K-DUR,KLOR-CON) 10 MEQ tablet Take 10 mEq by mouth daily.   0  . prochlorperazine (COMPAZINE) 10 MG tablet Take 1 tablet (10 mg total) by mouth every 6 (six) hours as needed (Nausea or vomiting). 30 tablet 1  . simvastatin (ZOCOR) 20 MG tablet Take 20 mg by mouth every evening.  5  . torsemide (DEMADEX) 20 MG tablet Take 1 tablet (20 mg total) by mouth daily. 30 tablet 1   No current facility-administered medications for this encounter.   Allergies: No Known Allergies  Physical Exam: Unable to assess due to encounter type.  Impression/Plan: 1. Progressive Metastatic Stage IV NSCLC, adenocarcinoma of the RUL with brain metastasis. Darrell Christensen continues to do well and is radiographically without disease in the brain. His case was discussed in brain oncology conference and it was recommended he continue with surveillance imaging with an MRI in 3 months per discussion in brain oncology conference. He will continue as well with Dr. Delton Coombes to receive systemic immunotherapy and follow up with CT imaging in the near future as well.  2. Claustraphobia. He will continue to premedicate with Ativan prior to MRI scans in the future.     Given current concerns for patient exposure during the COVID-19 pandemic, this encounter was conducted via telephone.  The patient has given verbal consent for this type of encounter. The time spent during this encounter was 25 minutes and 50% of that time was spent in the preparation, discussion, and coordination of his care. The attendants for this meeting include Shona Simpson, [facility] and Merita Norton and his daughter Timmothy Baranowski. During the encounter, Shona Simpson [facility] was located in the Radiation Oncology Department at [facility]. Darrell Christensen and his daughter Darrell Christensen were located at their own separate homes.    Carola Rhine, PAC

## 2019-09-30 ENCOUNTER — Other Ambulatory Visit: Payer: Self-pay

## 2019-09-30 ENCOUNTER — Ambulatory Visit (HOSPITAL_COMMUNITY)
Admission: RE | Admit: 2019-09-30 | Discharge: 2019-09-30 | Disposition: A | Discharge status: Home / Self Care | Payer: Medicare Other | Source: Ambulatory Visit | Attending: Hematology | Admitting: Hematology

## 2019-09-30 DIAGNOSIS — C3490 Malignant neoplasm of unspecified part of unspecified bronchus or lung: Secondary | ICD-10-CM | POA: Diagnosis not present

## 2019-09-30 DIAGNOSIS — C3411 Malignant neoplasm of upper lobe, right bronchus or lung: Secondary | ICD-10-CM | POA: Insufficient documentation

## 2019-09-30 MED ORDER — IOHEXOL 300 MG/ML  SOLN
75.0000 mL | Freq: Once | INTRAMUSCULAR | Status: AC | PRN
  Administered 2019-09-30: 75 mL via INTRAVENOUS

## 2019-10-06 ENCOUNTER — Other Ambulatory Visit (HOSPITAL_COMMUNITY): Payer: Medicare Other

## 2019-10-06 ENCOUNTER — Ambulatory Visit (HOSPITAL_COMMUNITY): Payer: Medicare Other

## 2019-10-06 ENCOUNTER — Ambulatory Visit (HOSPITAL_COMMUNITY): Payer: Medicare Other | Admitting: Hematology

## 2019-10-06 NOTE — Progress Notes (Signed)

## 2019-10-08 ENCOUNTER — Inpatient Hospital Stay (HOSPITAL_COMMUNITY): Payer: Medicare Other | Attending: Hematology

## 2019-10-08 ENCOUNTER — Encounter (HOSPITAL_COMMUNITY): Payer: Self-pay | Admitting: Hematology

## 2019-10-08 ENCOUNTER — Other Ambulatory Visit: Payer: Self-pay

## 2019-10-08 ENCOUNTER — Inpatient Hospital Stay (HOSPITAL_COMMUNITY): Payer: Medicare Other | Admitting: Hematology

## 2019-10-08 ENCOUNTER — Inpatient Hospital Stay (HOSPITAL_COMMUNITY): Payer: Medicare Other

## 2019-10-08 VITALS — BP 159/81 | HR 86 | Temp 97.3°F | Resp 19 | Wt 219.9 lb

## 2019-10-08 VITALS — BP 132/57 | HR 91 | Temp 97.7°F | Resp 18

## 2019-10-08 DIAGNOSIS — E042 Nontoxic multinodular goiter: Secondary | ICD-10-CM | POA: Diagnosis not present

## 2019-10-08 DIAGNOSIS — R21 Rash and other nonspecific skin eruption: Secondary | ICD-10-CM | POA: Insufficient documentation

## 2019-10-08 DIAGNOSIS — I700 Atherosclerosis of aorta: Secondary | ICD-10-CM | POA: Insufficient documentation

## 2019-10-08 DIAGNOSIS — Z7984 Long term (current) use of oral hypoglycemic drugs: Secondary | ICD-10-CM | POA: Diagnosis not present

## 2019-10-08 DIAGNOSIS — G629 Polyneuropathy, unspecified: Secondary | ICD-10-CM | POA: Diagnosis not present

## 2019-10-08 DIAGNOSIS — C3411 Malignant neoplasm of upper lobe, right bronchus or lung: Secondary | ICD-10-CM

## 2019-10-08 DIAGNOSIS — I10 Essential (primary) hypertension: Secondary | ICD-10-CM | POA: Insufficient documentation

## 2019-10-08 DIAGNOSIS — Z87891 Personal history of nicotine dependence: Secondary | ICD-10-CM | POA: Diagnosis not present

## 2019-10-08 DIAGNOSIS — Z86711 Personal history of pulmonary embolism: Secondary | ICD-10-CM | POA: Diagnosis not present

## 2019-10-08 DIAGNOSIS — C7931 Secondary malignant neoplasm of brain: Secondary | ICD-10-CM | POA: Insufficient documentation

## 2019-10-08 DIAGNOSIS — K760 Fatty (change of) liver, not elsewhere classified: Secondary | ICD-10-CM | POA: Diagnosis not present

## 2019-10-08 DIAGNOSIS — Z79899 Other long term (current) drug therapy: Secondary | ICD-10-CM | POA: Diagnosis not present

## 2019-10-08 DIAGNOSIS — E1140 Type 2 diabetes mellitus with diabetic neuropathy, unspecified: Secondary | ICD-10-CM | POA: Insufficient documentation

## 2019-10-08 DIAGNOSIS — Z5112 Encounter for antineoplastic immunotherapy: Secondary | ICD-10-CM | POA: Diagnosis not present

## 2019-10-08 LAB — CBC WITH DIFFERENTIAL/PLATELET
Abs Immature Granulocytes: 0.05 10*3/uL (ref 0.00–0.07)
Basophils Absolute: 0.1 10*3/uL (ref 0.0–0.1)
Basophils Relative: 1 %
Eosinophils Absolute: 1 10*3/uL — ABNORMAL HIGH (ref 0.0–0.5)
Eosinophils Relative: 8 %
HCT: 42.4 % (ref 39.0–52.0)
Hemoglobin: 13.4 g/dL (ref 13.0–17.0)
Immature Granulocytes: 0 %
Lymphocytes Relative: 38 %
Lymphs Abs: 4.6 10*3/uL — ABNORMAL HIGH (ref 0.7–4.0)
MCH: 28.9 pg (ref 26.0–34.0)
MCHC: 31.6 g/dL (ref 30.0–36.0)
MCV: 91.4 fL (ref 80.0–100.0)
Monocytes Absolute: 0.9 10*3/uL (ref 0.1–1.0)
Monocytes Relative: 7 %
Neutro Abs: 5.5 10*3/uL (ref 1.7–7.7)
Neutrophils Relative %: 46 %
Platelets: 260 10*3/uL (ref 150–400)
RBC: 4.64 MIL/uL (ref 4.22–5.81)
RDW: 14.6 % (ref 11.5–15.5)
WBC: 12 10*3/uL — ABNORMAL HIGH (ref 4.0–10.5)
nRBC: 0 % (ref 0.0–0.2)

## 2019-10-08 LAB — COMPREHENSIVE METABOLIC PANEL
ALT: 15 U/L (ref 0–44)
AST: 12 U/L — ABNORMAL LOW (ref 15–41)
Albumin: 4.1 g/dL (ref 3.5–5.0)
Alkaline Phosphatase: 89 U/L (ref 38–126)
Anion gap: 11 (ref 5–15)
BUN: 28 mg/dL — ABNORMAL HIGH (ref 8–23)
CO2: 27 mmol/L (ref 22–32)
Calcium: 9.1 mg/dL (ref 8.9–10.3)
Chloride: 102 mmol/L (ref 98–111)
Creatinine, Ser: 1.51 mg/dL — ABNORMAL HIGH (ref 0.61–1.24)
GFR calc Af Amer: 52 mL/min — ABNORMAL LOW (ref >60)
GFR calc non Af Amer: 45 mL/min — ABNORMAL LOW (ref >60)
Glucose, Bld: 111 mg/dL — ABNORMAL HIGH (ref 70–99)
Potassium: 4.1 mmol/L (ref 3.5–5.1)
Sodium: 140 mmol/L (ref 135–145)
Total Bilirubin: 0.4 mg/dL (ref 0.3–1.2)
Total Protein: 7.2 g/dL (ref 6.5–8.1)

## 2019-10-08 LAB — MAGNESIUM: Magnesium: 2.3 mg/dL (ref 1.7–2.4)

## 2019-10-08 MED ORDER — SODIUM CHLORIDE 0.9 % IV SOLN
200.0000 mg | Freq: Once | INTRAVENOUS | Status: AC
  Administered 2019-10-08: 200 mg via INTRAVENOUS
  Filled 2019-10-08: qty 8

## 2019-10-08 MED ORDER — SODIUM CHLORIDE 0.9% FLUSH
10.0000 mL | INTRAVENOUS | Status: DC | PRN
  Administered 2019-10-08: 10 mL

## 2019-10-08 MED ORDER — SODIUM CHLORIDE 0.9 % IV SOLN: Freq: Once | INTRAVENOUS | Status: AC

## 2019-10-08 MED ORDER — HEPARIN SOD (PORK) LOCK FLUSH 100 UNIT/ML IV SOLN
500.0000 [IU] | Freq: Once | INTRAVENOUS | Status: AC | PRN
  Administered 2019-10-08: 500 [IU]

## 2019-10-08 NOTE — Progress Notes (Signed)
Patient has been assessed, vital signs and labs have been reviewed by Dr. Katragadda. ANC, Creatinine, LFTs, and Platelets are within treatment parameters per Dr. Katragadda. The patient is good to proceed with treatment at this time.  

## 2019-10-08 NOTE — Patient Instructions (Addendum)
[place] at [facility] Discharge Instructions  You were seen today by Dr. Delton Coombes. He went over your recent results. He also went over your CT scan of your chest. Dr. Delton Coombes would like you to be more active Dr. Delton Coombes will see you back in 6 weeks for labs and follow up.   Thank you for choosing [place] at [facility] to provide your oncology and hematology care.  To afford each patient quality time with our provider, please arrive at least 15 minutes before your scheduled appointment time.   If you have a lab appointment with the [facility] please come in thru the  Main Entrance and check in at the main information desk  You need to re-schedule your appointment should you arrive 10 or more minutes late.  We strive to give you quality time with our providers, and arriving late affects you and other patients whose appointments are after yours.  Also, if you no show three or more times for appointments you may be dismissed from the clinic at the providers discretion.     Again, thank you for choosing [facility].  Our hope is that these requests will decrease the amount of time that you wait before being seen by our physicians.       _____________________________________________________________  Should you have questions after your visit to [facility], please contact our office at [phone] between the hours of 8:00 a.m. and 4:30 p.m.  Voicemails left after 4:00 p.m. will not be returned until the following business day.  For prescription refill requests, have your pharmacy contact our office and allow 72 hours.    Cancer Center Support Programs:   > Cancer Support Group  2nd Tuesday of the month 1pm-2pm, Journey Room

## 2019-10-08 NOTE — Patient Instructions (Signed)
[facility] Discharge Instructions for Patients Receiving Chemotherapy   Beginning January 23rd 2017 lab work for the [facility] will be done in the  Main lab at [facility] on 1st floor. If you have a lab appointment with the [facility] please come in thru the  Main Entrance and check in at the main information desk   Today you received the following chemotherapy agents Keytruda infusion. Follow-up as scheduled  To help prevent nausea and vomiting after your treatment, we encourage you to take your nausea medication   If you develop nausea and vomiting, or diarrhea that is not controlled by your medication, call the clinic.  The clinic phone number is [phone]. Office hours are Monday-Friday 8:30am-5:00pm.  BELOW ARE SYMPTOMS THAT SHOULD BE REPORTED IMMEDIATELY:  *FEVER GREATER THAN 101.0 F  *CHILLS WITH OR WITHOUT FEVER  NAUSEA AND VOMITING THAT IS NOT CONTROLLED WITH YOUR NAUSEA MEDICATION  *UNUSUAL SHORTNESS OF BREATH  *UNUSUAL BRUISING OR BLEEDING  TENDERNESS IN MOUTH AND THROAT WITH OR WITHOUT PRESENCE OF ULCERS  *URINARY PROBLEMS  *BOWEL PROBLEMS  UNUSUAL RASH Items with * indicate a potential emergency and should be followed up as soon as possible. If you have an emergency after office hours please contact your primary care physician or go to the nearest emergency department.  Please call the clinic during office hours if you have any questions or concerns.   You may also contact the Patient Navigator at [phone] should you have any questions or need assistance in obtaining follow up care.      Resources For Cancer Patients and their Caregivers ? American Cancer Society: Can assist with transportation, wigs, general needs, runs Look Good Feel Better.        610-814-3597 ? Cancer Care: Provides financial assistance, online support groups, medication/co-pay assistance.  1-800-813-HOPE 337-366-5771) ? [facility] Assists [place] cancer patients and their families through emotional , educational and financial support.  (808) 695-6213 ? [place] DSS Where to apply for food stamps, Medicaid and utility assistance. (225) 573-0396 ? [facility]: Transportation to medical appointments. 747-285-8430 ? Social Security Administration: May apply for disability if have a Stage IV cancer. 616-711-1560 734-282-2118 ? [employer], Disability and Transit Services: Assists with nutrition, care and transit needs. 205-488-0756

## 2019-10-08 NOTE — Progress Notes (Signed)
0916 Labs reviewed with and pt seen by Dr. Delton Coombes and pt approved for Keytruda infusion today per MD                                 Darrell Christensen tolerated Keytruda infusion well without complaints or incident. VSS upon discharge. Pt discharged self ambulatory in satisfactory condition

## 2019-10-08 NOTE — Progress Notes (Signed)
[place], [place] [address]   CLINIC:  Medical Oncology/Hematology  PCP:  Celene Squibb, MD [address] [place] [address] [phone]   REASON FOR VISIT:  Follow-up for stage IV adenocarcinoma of the right lung  CURRENT THERAPY: Keytruda  BRIEF ONCOLOGIC HISTORY:  Oncology History  Primary cancer of right upper lobe of lung ([place])  05/14/2015 Procedure   Port placement by Dr. Arnoldo Morale   04/01/2016 Initial Diagnosis   Primary cancer of right upper lobe of lung ([place])   04/02/2016 Procedure   Ultrasound-guided core biopsy performed of a solid 1.5 cm soft tissue nodule in the right chest wall.   04/03/2016 Imaging   MRI brain- No acute and intracranial process or metastasis.  Moderate chronic small vessel ischemic disease.   04/04/2016 Pathology Results   Diagnosis Soft Tissue Needle Core Biopsy, Right Chest Wall SMOOTH MUSCLE NEOPLASM Microscopic Comment The differential diagnosis include low grade leiomyosarcoma. The neoplasm shows spindle cell morphology with rare mitotic figures and minimal atypia, no necrosis present. These cells stains positive for smooth muscle actin, desmin, negative for cytokeratin AE1&3, ck8/18, s100, cd117, cd 34 and cd99. This case also reviewed by Dr. [name] and agree.   04/05/2016 Procedure   CT-guided biopsy of right upper lobe lesion, with tissue specimen sent to pathology, by IR.   04/09/2016 Pathology Results   Lung, needle/core biopsy(ies), Right Upper Lobe - NON SMALL CELL CARCINOMA.   04/17/2016 Pathology Results   PDL1 High Expression.  TPS 50%.   04/24/2016 PET scan   1. Prominently hypermetabolic large right upper lobe mass with metastatic disease to the right paratracheal and right hilar nodal chains, to the descending mesocolon, to the left upper buttock subcutaneous tissues, to the left upper lobe, to the right subscapularis muscle, and possibly to the left adrenal  gland and right breast subcutaneous tissues. Appearance compatible with stage IV lung cancer. 2. There is some focal high activity in the ascending colon which is probably from peristalsis, less likely from a local colon mass. This does raise the possibility that the adjacent mesocolon tumor implant could be due to synchronous colon cancer rather than lung metastasis. 3. Other imaging findings of potential clinical significance: Coronary, aortic arch, and branch vessel atherosclerotic vascular disease. Aortoiliac atherosclerotic vascular disease. Deformity left proximal humerus and left glenohumeral joint from prior trauma. Enlarged prostate gland.   04/26/2016 Pathology Results   FoundationONE: Genomic alterations identified- KRAS V25D, CREBBP splice site [id number], LRP1B S515*, VQQ59 D63*, OVF64 splice site [id number], SPTA1 splice site [id number], TP53 C135Y.  Additional findings- MS-Stable, TMB-intermediate (14 Muts/Mb).  No reportable alterations- EGFR, ALK, BRAF, MET, ERBB2, RET, ROS1.   05/17/2016 -  Chemotherapy   The patient had ONDANSETRON IVPB [facility] +/- DEXAMETHASONE, , Intravenous,  Once, 0 of 1 cycle  pembrolizumab (KEYTRUDA) 200 mg in sodium chloride 0.9 % 50 mL chemo infusion, 200 mg, Intravenous, Once, 0 of 5 cycles  for chemotherapy treatment.     08/06/2016 PET scan   IMPRESSION: 1. Overall considerable improvement. The right upper lobe mass is markedly reduced in volume and SUV. Thoracic adenopathy have resolved and the hypermetabolic lesion in the right subscapularis muscle has also resolved. 2. The left upper lobe nodule is no longer appreciably hypermetabolic, and is highly indistinct although this may be due to motion artifact. Faint in indistinct nodularity elsewhere in the lungs likewise not currently hypermetabolic. 3. There is some very faint residual low-grade activity  along the subcutaneous deposit along the upper left buttock region. Marked reduction  in size and activity of the tumor deposit adjacent to the descending colon. 4. Prior right pleural effusion has resolved. 5. Stable appearance of the adrenal glands, with low-level activity and a nodule in the left adrenal gland. 6. Stable small nodule in the right breast, low-grade metabolic activity with maximum SUV 2.2 (formerly 3.0) 7. Other imaging findings of potential clinical significance: Severe arthropathy of the left glenohumeral joint. Coronary, aortic arch, and branch vessel atherosclerotic vascular disease. Aortoiliac atherosclerotic vascular disease. Prominent prostate gland indents the bladder base.    12/16/2018 -  Chemotherapy   The patient had palonosetron (ALOXI) injection 0.25 mg, 0.25 mg, Intravenous,  Once, 4 of 4 cycles Administration: 0.25 mg (12/16/2018), 0.25 mg (01/27/2019), 0.25 mg (03/03/2019), 0.25 mg (01/06/2019) pegfilgrastim (NEULASTA) injection 6 mg, 6 mg, Subcutaneous, Once, 2 of 2 cycles Administration: 6 mg (01/08/2019) pegfilgrastim (NEULASTA ONPRO KIT) injection 6 mg, 6 mg, Subcutaneous, Once, 2 of 2 cycles pegfilgrastim-cbqv (UDENYCA) injection 6 mg, 6 mg, Subcutaneous, Once, 2 of 2 cycles Administration: 6 mg (01/29/2019), 6 mg (03/05/2019) ondansetron (ZOFRAN) 8 mg in sodium chloride 0.9 % 50 mL IVPB, 8 mg (100 % of original dose 8 mg), Intravenous,  Once, 3 of 3 cycles Dose modification: 8 mg (original dose 8 mg, Cycle 5) Administration: 8 mg (03/24/2019), 8 mg (04/16/2019) PEMEtrexed (ALIMTA) 1,100 mg in sodium chloride 0.9 % 100 mL chemo infusion, 490 mg/m2 = 1,125 mg, Intravenous,  Once, 6 of 6 cycles Dose modification: 400 mg/m2 (80 % of original dose 500 mg/m2, Cycle 3, Reason: Other (see comments), Comment: rash, swelling) Administration: 1,100 mg (12/16/2018), 900 mg (01/27/2019), 900 mg (03/03/2019), 900 mg (03/24/2019), 900 mg (04/16/2019), 1,100 mg (01/06/2019) CARBOplatin (PARAPLATIN) 500 mg in sodium chloride 0.9 % 250 mL chemo infusion, 530 mg  (110.7 % of original dose 477.5 mg), Intravenous,  Once, 4 of 4 cycles Dose modification:   (original dose 477.5 mg, Cycle 1),   (original dose 485.5 mg, Cycle 3),   (original dose 494 mg, Cycle 4),   (original dose 494 mg, Cycle 2) Administration: 500 mg (12/16/2018), 490 mg (01/27/2019), 490 mg (03/03/2019), 490 mg (01/06/2019) pembrolizumab (KEYTRUDA) 200 mg in sodium chloride 0.9 % 50 mL chemo infusion, 2 mg/kg = 200 mg, Intravenous, Once, 8 of 10 cycles Dose modification: 200 mg (original dose 2 mg/kg, Cycle 14, Reason: Provider Judgment) Administration: 200 mg (04/16/2019), 200 mg (05/31/2019), 200 mg (06/22/2019), 200 mg (07/13/2019), 200 mg (05/10/2019), 200 mg (08/03/2019), 200 mg (08/24/2019), 200 mg (09/14/2019) fosaprepitant (EMEND) 150 mg, dexamethasone (DECADRON) 12 mg in sodium chloride 0.9 % 145 mL IVPB, , Intravenous,  Once, 4 of 4 cycles Administration:  (12/16/2018),  (01/27/2019),  (03/03/2019),  (01/06/2019)  for chemotherapy treatment.    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  Two adjacent large masslike areas of consolidation within the right upper lobe with differential considerations including malignancy or pneumonia. Multiple enlarged right hilar and mediastinal lymph nodes which may be reactive or metastatic in etiology.  Additional indeterminate pulmonary nodules as above. Recommend attention on follow-up.  Indeterminate 2.9 cm left adrenal nodule. This needs dedicated evaluation with pre and post contrast-enhanced CT or MRI after confirmation of pulmonary process.  Indeterminate nodule within the right breast. Recommend dedicated evaluation with mammography.  Hepatic steatosis.   04/02/2016 Procedure  Ultrasound-guided core biopsy performed of a solid 1.5 cm soft tissue nodule in the right chest wall.   04/04/2016 Pathology  Results   Diagnosis Soft Tissue Needle Core Biopsy, Right Chest Wall SMOOTH MUSCLE NEOPLASM Microscopic Comment The differential diagnosis include low grade leiomyosarcoma. The neoplasm shows spindle cell morphology with rare mitotic figures and minimal atypia, no necrosis present. These cells stains positive for smooth muscle actin, desmin, negative for cytokeratin AE1&3, ck8/18, s100, cd117, cd 34 and cd99. This case also reviewed by Dr. [name] and agree.      CANCER STAGING: Cancer Staging Primary cancer of right upper lobe of lung ([place]) Staging form: Lung, AJCC 8th Edition - Clinical: Stage IVB (cT3, cN2, cM1c) - Signed by Baird Cancer, PA-C on 05/07/2016   INTERVAL HISTORY:  Mr. LIZANDRO SPELLMAN, a 74 y.o. male, returns for routine follow-up and consideration for next cycle of chemotherapy. Dresean was last seen on 09/14/2019.  Due for cycle #14 of Keytruda today.   Overall, he tells me he has been feeling pretty well. He denies SOB, since he is sedentary. He denies diarrhea, but reports constipation. He still has skin rashes on the bilat legs, though they are improving.  Overall, he feels ready for next cycle of chemo today.    REVIEW OF SYSTEMS:  Review of Systems  Constitutional: Positive for fatigue (severe). Negative for appetite change.  Respiratory: Positive for shortness of breath.   Cardiovascular: Positive for chest pain.  Gastrointestinal: Positive for constipation. Negative for diarrhea.  Neurological: Positive for dizziness and numbness (hands & feet).  All other systems reviewed and are negative.   PAST MEDICAL/SURGICAL HISTORY:  Past Medical History:  Diagnosis Date  . Adrenal mass, left ([place]) 04/01/2016  . Diabetes mellitus ([place]) 05/02/2016  . Diabetes mellitus without complication ([place])   . Hypertension   . Mass of breast, right 04/01/2016  . Mass of upper lobe of right lung 04/01/2016   2 adjacent, 5 cm masses, hilar adenopathy  . Primary  cancer of right upper lobe of lung ([place]) 04/01/2016   2 adjacent, 5 cm masses, hilar adenopathy  . Pulmonary embolus ([place]) 03/31/2016  . 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 COLONOSCOPY N/A 07/28/2017   Procedure: COLONOSCOPY;  Surgeon: Rogene Houston, MD;  Location: [facility] ENDO SUITE;  Service: Endoscopy;  Laterality: N/A;  205  . PORTACATH PLACEMENT Left 05/13/2016   Procedure: INSERTION PORT-A-CATH;  Surgeon: Aviva Signs, MD;  Location: [facility] ORS;  Service: General;  Laterality: Left;    SOCIAL HISTORY:  Social History   Socioeconomic History  . Marital status: Divorced    Spouse name: Not on file  . Number of children: Not on file  . Years of education: Not on file  . Highest education level: Not on file  Occupational History  . Not on file  Tobacco Use  . Smoking status: Former Smoker    Years: 61.00    Quit date: 04/11/2014    Years since quitting: 5.4  . Smokeless tobacco: Never Used  . Tobacco comment: patient does vape smoking x 2 years  Substance and Sexual Activity  . Alcohol use: Not Currently    Comment: quit 10 years  . Drug use: No  . Sexual activity: Not on file  Other Topics Concern  . Not on file  Social History Narrative  . Not on file   Social Determinants of Health   Financial Resource Strain:   .  Difficulty of Paying Living Expenses:   Food Insecurity:   . Worried About Charity fundraiser in the Last Year:   . Arboriculturist in the Last Year:   Transportation Needs: No Transportation Needs  . Lack of Transportation (Medical): No  . Lack of Transportation (Non-Medical): No  Physical Activity:   . Days of Exercise per Week:   . Minutes of Exercise per Session:   Stress:   . Feeling of Stress :   Social Connections:   . Frequency of Communication with Friends and Family:   . Frequency of Social Gatherings with Friends and Family:   . Attends Religious Services:   . Active Member of Clubs or  Organizations:   . Attends Archivist Meetings:   Marland Kitchen Marital Status:   Intimate Partner Violence:   . Fear of Current or Ex-Partner:   . Emotionally Abused:   Marland Kitchen Physically Abused:   . Sexually Abused:     FAMILY HISTORY:  Family History  Problem Relation Age of Onset  . Stroke Mother   . Heart attack Father   . Heart attack Brother     CURRENT MEDICATIONS:  Current Outpatient Medications  Medication Sig Dispense Refill  . ALPRAZolam (XANAX) 1 MG tablet One tab po 30 minutes prior to MRI or radiation 30 tablet 0  . amLODipine (NORVASC) 2.5 MG tablet Take 2.5 mg by mouth daily.  4  . CARBOPLATIN IV Inject into the vein every 21 ( twenty-one) days.    . DULoxetine (CYMBALTA) 60 MG capsule Take 60 mg by mouth daily.    . folic acid (FOLVITE) 1 MG tablet TAKE ONE TABLET BY MOUTH ONCE DAILY. START 5-7 DAYS BEFORE ALIMTA CHEMOTHERAPY AND CONTINUE FOR 21 DAYS AFTER CHEMOTHERAPY. 100 tablet 0  . furosemide (LASIX) 20 MG tablet Take 2 tablets (40 mg total) by mouth daily. 20 tablet 0  . gabapentin (NEURONTIN) 400 MG capsule Take 1 capsule (400 mg total) by mouth 3 (three) times daily. 90 capsule 3  . GLIPIZIDE XL 5 MG 24 hr tablet Take 5 mg by mouth daily.  4  . levocetirizine (XYZAL) 5 MG tablet Take 5 mg by mouth at bedtime.    . magnesium oxide (MAG-OX) 400 (241.3 Mg) MG tablet Take 1 tablet (400 mg total) by mouth 2 (two) times daily. 60 tablet 2  . metFORMIN (GLUCOPHAGE) 500 MG tablet Take 500 mg by mouth 2 (two) times daily.  4  . metoprolol succinate (TOPROL-XL) 25 MG 24 hr tablet TAKE ONE TABLET BY MOUTH IN THE EVENING. 30 tablet 0  . oxyCODONE-acetaminophen (PERCOCET) 10-325 MG tablet Take 1 tablet by mouth as needed.   0  . Pembrolizumab (KEYTRUDA IV) Inject into the vein.    Marland Kitchen PEMEtrexed 500 mg/m2 in sodium chloride 0.9 % 100 mL Inject into the vein every 21 ( twenty-one) days.    . potassium chloride (K-DUR,KLOR-CON) 10 MEQ tablet Take 10 mEq by mouth daily.   0  .  simvastatin (ZOCOR) 20 MG tablet Take 20 mg by mouth every evening.  5  . torsemide (DEMADEX) 20 MG tablet Take 1 tablet (20 mg total) by mouth daily. 30 tablet 1  . hydrocortisone 2.5 % lotion Apply topically as needed.     . lactulose (CHRONULAC) 10 GM/15ML solution Take 30 mLs (20 g total) by mouth at bedtime as needed for mild constipation. (Patient not taking: Reported on 10/08/2019) 480 mL 2  . lidocaine-prilocaine (EMLA) cream APPLY A  QUARTER SIZE AMOUNT TO THE AFFECTED AREA 1 HOUR PRIOR TO COMING TO CHEMOTHERAPY. COVER WITH A PLASTIC WRAP. (Patient not taking: Reported on 10/08/2019) 30 g 0  . ondansetron (ZOFRAN) 4 MG tablet Take 1 tablet (4 mg total) by mouth every 8 (eight) hours as needed for nausea or vomiting. (Patient not taking: Reported on 10/08/2019) 4 tablet 0  . prochlorperazine (COMPAZINE) 10 MG tablet Take 1 tablet (10 mg total) by mouth every 6 (six) hours as needed (Nausea or vomiting). (Patient not taking: Reported on 10/08/2019) 30 tablet 1   No current facility-administered medications for this visit.    ALLERGIES:  No Known Allergies  PHYSICAL EXAM:  Performance status (ECOG): 1 - Symptomatic but completely ambulatory  Vitals:   10/08/19 0843  BP: (!) 159/81  Pulse: 86  Resp: 19  Temp: (!) 97.3 F (36.3 C)  SpO2: 98%   Wt Readings from Last 3 Encounters:  10/08/19 219 lb 14.4 oz (99.7 kg)  09/14/19 224 lb (101.6 kg)  08/24/19 215 lb (97.5 kg)   Physical Exam Vitals reviewed.  Constitutional:      Appearance: Normal appearance. He is obese.  Cardiovascular:     Rate and Rhythm: Normal rate and regular rhythm.     Pulses: Normal pulses.     Heart sounds: Normal heart sounds.  Pulmonary:     Effort: Pulmonary effort is normal.     Breath sounds: Normal breath sounds.  Musculoskeletal:     Right lower leg: Edema present.     Left lower leg: Edema present.  Skin:    Findings: Rash (outer legs bilat) present.  Neurological:     General: No focal deficit  present.     Mental Status: He is alert and oriented to person, place, and time.  Psychiatric:        Mood and Affect: Mood normal.        Behavior: Behavior normal.     LABORATORY DATA:  I have reviewed the labs as listed.  CBC Latest Ref Rng & Units 10/08/2019 09/14/2019 08/24/2019  WBC 4.0 - 10.5 K/uL 12.0(H) 12.9(H) 13.1(H)  Hemoglobin 13.0 - 17.0 g/dL 13.4 13.5 13.1  Hematocrit 39.0 - 52.0 % 42.4 42.9 41.6  Platelets 150 - 400 K/uL 260 239 287   CMP Latest Ref Rng & Units 10/08/2019 09/14/2019 08/24/2019  Glucose 70 - 99 mg/dL 111(H) 122(H) 176(H)  BUN 8 - 23 mg/dL 28(H) 23 25(H)  Creatinine 0.61 - 1.24 mg/dL 1.51(H) 1.43(H) 1.56(H)  Sodium 135 - 145 mmol/L 140 141 142  Potassium 3.5 - 5.1 mmol/L 4.1 4.4 4.0  Chloride 98 - 111 mmol/L 102 102 102  CO2 22 - 32 mmol/L '27 27 29  ' Calcium 8.9 - 10.3 mg/dL 9.1 9.2 9.3  Total Protein 6.5 - 8.1 g/dL 7.2 7.2 7.2  Total Bilirubin 0.3 - 1.2 mg/dL 0.4 0.5 0.4  Alkaline Phos 38 - 126 U/L 89 95 97  AST 15 - 41 U/L 12(L) 17 16  ALT 0 - 44 U/L '15 15 15    ' DIAGNOSTIC IMAGING:  I have independently reviewed the scans and discussed with the patient. CT Chest W Contrast  Result Date: 09/30/2019 CLINICAL DATA:  Stage IV non-small cell lung cancer.  Restaging. EXAM: CT CHEST WITH CONTRAST TECHNIQUE: Multidetector CT imaging of the chest was performed during intravenous contrast administration. CONTRAST:  33m OMNIPAQUE IOHEXOL 300 MG/ML  SOLN COMPARISON:  07/08/2019 FINDINGS: Cardiovascular: Heart size upper normal. Coronary artery calcification is evident. Atherosclerotic  calcification is noted in the wall of the thoracic aorta. Mediastinum/Nodes: Right thyroid nodules are stable and can be reassessed at the time of restaging chest CT. Scattered small mediastinal lymph nodes are unchanged. One of the more dominant lymph nodes is a subcarinal node measuring 11 mm on image 73/2, slightly progressed from around 7 mm previously. No definite left hilar  lymphadenopathy. Soft tissue fullness in the inferior right hilum is similar to prior. Lungs/Pleura: Right upper lobe lung lesion measures 3.8 x 2.3 cm today compared to 4.2 x 2.6 cm previously. 6 mm subpleural anterior right middle lobe nodule measured previously is stable at 6 mm today. Additional 6 mm parenchymal right middle lobe nodule measured on the prior study is stable on 83/4 today. No change 8 mm left upper lobe nodule (59/4). Diffuse subpleural reticulation is again noted. No pleural effusion. Upper Abdomen: The left adrenal nodule measures 2.5 x 1.4 cm today compared to 2.5 x 1.6 cm previously Musculoskeletal: Soft tissue nodule in the right breast is stable. No worrisome lytic or sclerotic osseous abnormality. Advanced degenerative changes noted left shoulder. IMPRESSION: 1. No substantial interval change in the right upper lobe lung lesion although it does measure minimally smaller today. 2. Stable bilateral pulmonary nodules. 3. Slight interval increase in size of a borderline enlarged subcarinal lymph node. Close attention on follow-up recommended. 4. Stable left adrenal nodule. 5. Subpleural reticulation in the lungs suggest underlying fibrotic lung disease. 6. Aortic Atherosclerosis (ICD10-I70.0). Electronically Signed   By: Misty Stanley M.D.   On: 09/30/2019 12:55   MR Brain W Wo Contrast  Result Date: 09/17/2019 CLINICAL DATA:  Metastatic lung cancer post SRS EXAM: MRI HEAD WITHOUT AND WITH CONTRAST TECHNIQUE: Multiplanar, multiecho pulse sequences of the brain and surrounding structures were obtained without and with intravenous contrast. CONTRAST:  69m GADAVIST GADOBUTROL 1 MMOL/ML IV SOLN COMPARISON:  06/17/2019 FINDINGS: Motion artifact is present. Brain: A 5 mm enhancing lesion is again identified in the region of the right parietal operculum. There is no new mass or abnormal enhancement within the above limitation. There is no acute infarction or intracranial hemorrhage. Patchy and  confluent areas of T2 hyperintensity in the supratentorial and pontine white matter are nonspecific but may reflect stable chronic microvascular ischemic changes. There is no hydrocephalus. Vascular: Major vessel flow voids at the skull base are preserved. Skull and upper cervical spine: Normal marrow signal is preserved. Sinuses/Orbits: Mild mucosal thickening.  Orbits are unremarkable. Other: Sella is unremarkable.  Mastoid air cells are clear. IMPRESSION: Stable treated right parietal metastasis. No new mass or abnormal enhancement. Electronically Signed   By: PMacy MisM.D.   On: 09/17/2019 08:33     ASSESSMENT:  1.  Stage IV adenocarcinoma lung, PD-L1 50%, other actionable mutations negative: --Keytruda from 05/17/2016 through 09/24/2018 with mild progression. -4 cycles of carboplatin, pemetrexed from 12/25/2018 through 03/03/2019. -Pemetrexed maintenance from 03/24/2019 through 04/12/2019 discontinued secondary to renal insufficiency. -CT chest with contrast from 09/30/2019 shows right upper lobe lung lesion measuring 3.8 x 2.3)(4.2 x 2.6 cm).  No change in the left upper lobe nodule.  Subcarinal lymph node measures 11 mm, previously 7 mm.  No definite left hilar adenopathy.  Stable left adrenal nodule.  2.  Brain metastasis: -SRS to the right parietal lobe lesion with surrounding edema on 11/24/2018. -MRI of the brain on 09/16/2019 shows stable treated right parietal metastasis.  No new mass or abnormal enhancement.  PLAN:  1.  Stage IV adenocarcinoma lung, PD-L1 50%,  other actionable mutations negative: -We reviewed his CT scan images.  Slight improvement in lung lesion. -We will continue immunotherapy at this time.  I have reviewed labs from today.  Creatinine slightly increased to 1.51.  We will plan to repeat scans in 3 months.  I plan to see him back in 6 weeks for follow-up.  2.  Brain metastasis: -We reviewed MRI of the brain which showed stable treated right parietal meta stasis.   MRI will be repeated in 3 months.  3.  Itching rash: -Multiple scratch marks on bilateral shins stable.  This is from dry skin from immunotherapy and fluid retention.  Continue to apply moisturizing lotion.  4.  Peripheral neuropathy: -Continue gabapentin 400 mg 3 times a day.  Continue duloxetine 30 mg daily.  5.  Hypomagnesemia: -Continue magnesium twice daily.  Magnesium is 2.3 today.    Orders placed this encounter:  No orders of the defined types were placed in this encounter.    Derek Jack, MD [place] [phone]   I, Milinda Antis, am acting as a scribe for Dr. Sanda Linger.  I, Derek Jack MD, have reviewed the above documentation for accuracy and completeness, and I agree with the above.

## 2019-10-11 ENCOUNTER — Other Ambulatory Visit (HOSPITAL_COMMUNITY): Payer: Self-pay | Admitting: *Deleted

## 2019-10-11 DIAGNOSIS — C3411 Malignant neoplasm of upper lobe, right bronchus or lung: Secondary | ICD-10-CM

## 2019-10-28 ENCOUNTER — Other Ambulatory Visit: Payer: Self-pay | Admitting: *Deleted

## 2019-10-28 DIAGNOSIS — C7931 Secondary malignant neoplasm of brain: Secondary | ICD-10-CM

## 2019-10-29 ENCOUNTER — Other Ambulatory Visit: Payer: Self-pay

## 2019-10-29 ENCOUNTER — Other Ambulatory Visit: Payer: Self-pay | Admitting: *Deleted

## 2019-10-29 ENCOUNTER — Inpatient Hospital Stay (HOSPITAL_COMMUNITY): Payer: Medicare Other

## 2019-10-29 ENCOUNTER — Encounter (HOSPITAL_COMMUNITY): Payer: Self-pay

## 2019-10-29 VITALS — BP 151/70 | HR 56 | Temp 97.3°F | Resp 18 | Wt 219.2 lb

## 2019-10-29 DIAGNOSIS — E042 Nontoxic multinodular goiter: Secondary | ICD-10-CM | POA: Diagnosis not present

## 2019-10-29 DIAGNOSIS — G629 Polyneuropathy, unspecified: Secondary | ICD-10-CM | POA: Diagnosis not present

## 2019-10-29 DIAGNOSIS — C7931 Secondary malignant neoplasm of brain: Secondary | ICD-10-CM | POA: Diagnosis not present

## 2019-10-29 DIAGNOSIS — R21 Rash and other nonspecific skin eruption: Secondary | ICD-10-CM | POA: Diagnosis not present

## 2019-10-29 DIAGNOSIS — C3411 Malignant neoplasm of upper lobe, right bronchus or lung: Secondary | ICD-10-CM

## 2019-10-29 DIAGNOSIS — Z79899 Other long term (current) drug therapy: Secondary | ICD-10-CM | POA: Diagnosis not present

## 2019-10-29 DIAGNOSIS — Z5112 Encounter for antineoplastic immunotherapy: Secondary | ICD-10-CM | POA: Diagnosis not present

## 2019-10-29 DIAGNOSIS — Z7984 Long term (current) use of oral hypoglycemic drugs: Secondary | ICD-10-CM | POA: Diagnosis not present

## 2019-10-29 DIAGNOSIS — Z87891 Personal history of nicotine dependence: Secondary | ICD-10-CM | POA: Diagnosis not present

## 2019-10-29 DIAGNOSIS — Z86711 Personal history of pulmonary embolism: Secondary | ICD-10-CM | POA: Diagnosis not present

## 2019-10-29 DIAGNOSIS — I700 Atherosclerosis of aorta: Secondary | ICD-10-CM | POA: Diagnosis not present

## 2019-10-29 DIAGNOSIS — K760 Fatty (change of) liver, not elsewhere classified: Secondary | ICD-10-CM | POA: Diagnosis not present

## 2019-10-29 DIAGNOSIS — E1140 Type 2 diabetes mellitus with diabetic neuropathy, unspecified: Secondary | ICD-10-CM | POA: Diagnosis not present

## 2019-10-29 DIAGNOSIS — I10 Essential (primary) hypertension: Secondary | ICD-10-CM | POA: Diagnosis not present

## 2019-10-29 LAB — COMPREHENSIVE METABOLIC PANEL
ALT: 13 U/L (ref 0–44)
AST: 14 U/L — ABNORMAL LOW (ref 15–41)
Albumin: 3.8 g/dL (ref 3.5–5.0)
Alkaline Phosphatase: 94 U/L (ref 38–126)
Anion gap: 10 (ref 5–15)
BUN: 19 mg/dL (ref 8–23)
CO2: 27 mmol/L (ref 22–32)
Calcium: 8.6 mg/dL — ABNORMAL LOW (ref 8.9–10.3)
Chloride: 102 mmol/L (ref 98–111)
Creatinine, Ser: 1.4 mg/dL — ABNORMAL HIGH (ref 0.61–1.24)
GFR calc Af Amer: 57 mL/min — ABNORMAL LOW (ref >60)
GFR calc non Af Amer: 49 mL/min — ABNORMAL LOW (ref >60)
Glucose, Bld: 126 mg/dL — ABNORMAL HIGH (ref 70–99)
Potassium: 3.8 mmol/L (ref 3.5–5.1)
Sodium: 139 mmol/L (ref 135–145)
Total Bilirubin: 0.4 mg/dL (ref 0.3–1.2)
Total Protein: 7.1 g/dL (ref 6.5–8.1)

## 2019-10-29 LAB — CBC WITH DIFFERENTIAL/PLATELET
Abs Immature Granulocytes: 0.09 10*3/uL — ABNORMAL HIGH (ref 0.00–0.07)
Basophils Absolute: 0.1 10*3/uL (ref 0.0–0.1)
Basophils Relative: 1 %
Eosinophils Absolute: 0.7 10*3/uL — ABNORMAL HIGH (ref 0.0–0.5)
Eosinophils Relative: 6 %
HCT: 41.8 % (ref 39.0–52.0)
Hemoglobin: 12.9 g/dL — ABNORMAL LOW (ref 13.0–17.0)
Immature Granulocytes: 1 %
Lymphocytes Relative: 42 %
Lymphs Abs: 5.2 10*3/uL — ABNORMAL HIGH (ref 0.7–4.0)
MCH: 28.7 pg (ref 26.0–34.0)
MCHC: 30.9 g/dL (ref 30.0–36.0)
MCV: 93.1 fL (ref 80.0–100.0)
Monocytes Absolute: 0.7 10*3/uL (ref 0.1–1.0)
Monocytes Relative: 6 %
Neutro Abs: 5.5 10*3/uL (ref 1.7–7.7)
Neutrophils Relative %: 44 %
Platelets: 303 10*3/uL (ref 150–400)
RBC: 4.49 MIL/uL (ref 4.22–5.81)
RDW: 14.6 % (ref 11.5–15.5)
WBC: 12.3 10*3/uL — ABNORMAL HIGH (ref 4.0–10.5)
nRBC: 0 % (ref 0.0–0.2)

## 2019-10-29 LAB — MAGNESIUM: Magnesium: 2.2 mg/dL (ref 1.7–2.4)

## 2019-10-29 MED ORDER — SODIUM CHLORIDE 0.9% FLUSH
10.0000 mL | INTRAVENOUS | Status: DC | PRN
  Administered 2019-10-29: 10 mL

## 2019-10-29 MED ORDER — SODIUM CHLORIDE 0.9 % IV SOLN
200.0000 mg | Freq: Once | INTRAVENOUS | Status: AC
  Administered 2019-10-29: 200 mg via INTRAVENOUS
  Filled 2019-10-29: qty 8

## 2019-10-29 MED ORDER — SODIUM CHLORIDE 0.9 % IV SOLN: Freq: Once | INTRAVENOUS | Status: AC

## 2019-10-29 MED ORDER — HEPARIN SOD (PORK) LOCK FLUSH 100 UNIT/ML IV SOLN: 500.0000 [IU] | Freq: Once | INTRAVENOUS | Status: DC | PRN

## 2019-10-29 NOTE — Progress Notes (Signed)
1135-patient exited bathroom with pump alarm noting infusion completed with needle found not in port.  Patient stated the pump was alarming inside the bathroom and when he turned around he pulled the needle out by accident.  Patients skin intact with no bruising, swelling, or redness noted. Denied pain at site.  No s/s of distress noted.  1140-patients port reaccessed and flushed with good blood return noted.  Denied pain with flush.  Band aid applied.   1145-Randi Lockamy, NP, notified with verbal order ok to discharge the patient.    Patient tolerated therapy with no complaints voiced.  Side effects with management reviewed with understanding verbalized.  Port site clean and dry with no bruising or swelling noted at site.  Good blood return noted before and after administration of therapy.  Band aid applied.  Patient left ambulatory with VSS and no s/s of distress noted.

## 2019-11-18 ENCOUNTER — Other Ambulatory Visit: Payer: Self-pay

## 2019-11-18 ENCOUNTER — Inpatient Hospital Stay (HOSPITAL_COMMUNITY): Payer: Medicare Other | Admitting: Hematology

## 2019-11-18 ENCOUNTER — Inpatient Hospital Stay (HOSPITAL_COMMUNITY): Payer: Medicare Other | Attending: Hematology

## 2019-11-18 ENCOUNTER — Inpatient Hospital Stay (HOSPITAL_COMMUNITY): Payer: Medicare Other

## 2019-11-18 VITALS — BP 142/77 | HR 73 | Temp 97.5°F | Resp 18

## 2019-11-18 VITALS — BP 151/89 | HR 73 | Temp 97.7°F | Resp 18 | Wt 217.4 lb

## 2019-11-18 DIAGNOSIS — Z5112 Encounter for antineoplastic immunotherapy: Secondary | ICD-10-CM | POA: Diagnosis not present

## 2019-11-18 DIAGNOSIS — C3411 Malignant neoplasm of upper lobe, right bronchus or lung: Secondary | ICD-10-CM

## 2019-11-18 DIAGNOSIS — C7931 Secondary malignant neoplasm of brain: Secondary | ICD-10-CM | POA: Diagnosis not present

## 2019-11-18 DIAGNOSIS — E1140 Type 2 diabetes mellitus with diabetic neuropathy, unspecified: Secondary | ICD-10-CM | POA: Insufficient documentation

## 2019-11-18 DIAGNOSIS — Z79899 Other long term (current) drug therapy: Secondary | ICD-10-CM | POA: Insufficient documentation

## 2019-11-18 LAB — CBC WITH DIFFERENTIAL/PLATELET
Abs Immature Granulocytes: 0.05 10*3/uL (ref 0.00–0.07)
Basophils Absolute: 0.1 10*3/uL (ref 0.0–0.1)
Basophils Relative: 1 %
Eosinophils Absolute: 0.4 10*3/uL (ref 0.0–0.5)
Eosinophils Relative: 4 %
HCT: 43.4 % (ref 39.0–52.0)
Hemoglobin: 13.7 g/dL (ref 13.0–17.0)
Immature Granulocytes: 0 %
Lymphocytes Relative: 39 %
Lymphs Abs: 4.5 10*3/uL — ABNORMAL HIGH (ref 0.7–4.0)
MCH: 29 pg (ref 26.0–34.0)
MCHC: 31.6 g/dL (ref 30.0–36.0)
MCV: 91.9 fL (ref 80.0–100.0)
Monocytes Absolute: 0.7 10*3/uL (ref 0.1–1.0)
Monocytes Relative: 6 %
Neutro Abs: 5.9 10*3/uL (ref 1.7–7.7)
Neutrophils Relative %: 50 %
Platelets: 235 10*3/uL (ref 150–400)
RBC: 4.72 MIL/uL (ref 4.22–5.81)
RDW: 14.6 % (ref 11.5–15.5)
WBC: 11.7 10*3/uL — ABNORMAL HIGH (ref 4.0–10.5)
nRBC: 0 % (ref 0.0–0.2)

## 2019-11-18 LAB — COMPREHENSIVE METABOLIC PANEL
ALT: 15 U/L (ref 0–44)
AST: 16 U/L (ref 15–41)
Albumin: 4 g/dL (ref 3.5–5.0)
Alkaline Phosphatase: 82 U/L (ref 38–126)
Anion gap: 11 (ref 5–15)
BUN: 22 mg/dL (ref 8–23)
CO2: 25 mmol/L (ref 22–32)
Calcium: 9 mg/dL (ref 8.9–10.3)
Chloride: 104 mmol/L (ref 98–111)
Creatinine, Ser: 1.65 mg/dL — ABNORMAL HIGH (ref 0.61–1.24)
GFR calc Af Amer: 46 mL/min — ABNORMAL LOW (ref >60)
GFR calc non Af Amer: 40 mL/min — ABNORMAL LOW (ref >60)
Glucose, Bld: 241 mg/dL — ABNORMAL HIGH (ref 70–99)
Potassium: 4 mmol/L (ref 3.5–5.1)
Sodium: 140 mmol/L (ref 135–145)
Total Bilirubin: 0.4 mg/dL (ref 0.3–1.2)
Total Protein: 7 g/dL (ref 6.5–8.1)

## 2019-11-18 LAB — MAGNESIUM: Magnesium: 2.1 mg/dL (ref 1.7–2.4)

## 2019-11-18 MED ORDER — SODIUM CHLORIDE 0.9% FLUSH: 10.0000 mL | INTRAVENOUS | Status: DC | PRN

## 2019-11-18 MED ORDER — HEPARIN SOD (PORK) LOCK FLUSH 100 UNIT/ML IV SOLN
500.0000 [IU] | Freq: Once | INTRAVENOUS | Status: AC | PRN
  Administered 2019-11-18: 500 [IU]

## 2019-11-18 MED ORDER — SODIUM CHLORIDE 0.9 % IV SOLN: Freq: Once | INTRAVENOUS | Status: AC

## 2019-11-18 MED ORDER — SODIUM CHLORIDE 0.9 % IV SOLN
200.0000 mg | Freq: Once | INTRAVENOUS | Status: AC
  Administered 2019-11-18: 200 mg via INTRAVENOUS
  Filled 2019-11-18: qty 8

## 2019-11-18 NOTE — Progress Notes (Signed)
11/18/19  TSH level with next treatment in August 2021.  T.O. Dr Rhys Martini, PharmD

## 2019-11-18 NOTE — Patient Instructions (Signed)
[place] at [facility] Discharge Instructions  You were seen today by Dr. Delton Coombes. He went over your recent results. Take the furosemide as needed for the swelling in your feet. Dr. Delton Coombes will see you back in 3 weeks for labs and follow up.   Thank you for choosing [place] at [facility] to provide your oncology and hematology care.  To afford each patient quality time with our provider, please arrive at least 15 minutes before your scheduled appointment time.   If you have a lab appointment with the [facility] please come in thru the Main Entrance and check in at the main information desk  You need to re-schedule your appointment should you arrive 10 or more minutes late.  We strive to give you quality time with our providers, and arriving late affects you and other patients whose appointments are after yours.  Also, if you no show three or more times for appointments you may be dismissed from the clinic at the providers discretion.     Again, thank you for choosing [facility].  Our hope is that these requests will decrease the amount of time that you wait before being seen by our physicians.       _____________________________________________________________  Should you have questions after your visit to [facility], please contact our office at [phone] between the hours of 8:00 a.m. and 4:30 p.m.  Voicemails left after 4:00 p.m. will not be returned until the following business day.  For prescription refill requests, have your pharmacy contact our office and allow 72 hours.    Cancer Center Support Programs:   > Cancer Support Group  2nd Tuesday of the month 1pm-2pm, Journey Room

## 2019-11-18 NOTE — Progress Notes (Signed)
[facility] [address] [place] [address]   CLINIC:  Medical Oncology/Hematology  PCP:  Celene Squibb, MD [address] [place] [address] [phone]   REASON FOR VISIT:  Follow-up for stage IV adenocarcinoma of right lung  PRIOR THERAPY:  1. Keytruda from 05/17/2016 to 09/24/2018 2. Carboplatin, pemetrexed x 4 cycles from 12/25/2018 to 03/03/2019  CURRENT THERAPY: Keytruda  BRIEF ONCOLOGIC HISTORY:  Oncology History  Primary cancer of right upper lobe of lung ([place])  05/14/2015 Procedure   Port placement by Dr. Arnoldo Morale   04/01/2016 Initial Diagnosis   Primary cancer of right upper lobe of lung ([place])   04/02/2016 Procedure   Ultrasound-guided core biopsy performed of a solid 1.5 cm soft tissue nodule in the right chest wall.   04/03/2016 Imaging   MRI brain- No acute and intracranial process or metastasis.  Moderate chronic small vessel ischemic disease.   04/04/2016 Pathology Results   Diagnosis Soft Tissue Needle Core Biopsy, Right Chest Wall SMOOTH MUSCLE NEOPLASM Microscopic Comment The differential diagnosis include low grade leiomyosarcoma. The neoplasm shows spindle cell morphology with rare mitotic figures and minimal atypia, no necrosis present. These cells stains positive for smooth muscle actin, desmin, negative for cytokeratin AE1&3, ck8/18, s100, cd117, cd 34 and cd99. This case also reviewed by Dr. [name] and agree.   04/05/2016 Procedure   CT-guided biopsy of right upper lobe lesion, with tissue specimen sent to pathology, by IR.   04/09/2016 Pathology Results   Lung, needle/core biopsy(ies), Right Upper Lobe - NON SMALL CELL CARCINOMA.   04/17/2016 Pathology Results   PDL1 High Expression.  TPS 50%.   04/24/2016 PET scan   1. Prominently hypermetabolic large right upper lobe mass with metastatic disease to the right paratracheal and right hilar nodal chains, to the descending mesocolon, to the left upper  buttock subcutaneous tissues, to the left upper lobe, to the right subscapularis muscle, and possibly to the left adrenal gland and right breast subcutaneous tissues. Appearance compatible with stage IV lung cancer. 2. There is some focal high activity in the ascending colon which is probably from peristalsis, less likely from a local colon mass. This does raise the possibility that the adjacent mesocolon tumor implant could be due to synchronous colon cancer rather than lung metastasis. 3. Other imaging findings of potential clinical significance: Coronary, aortic arch, and branch vessel atherosclerotic vascular disease. Aortoiliac atherosclerotic vascular disease. Deformity left proximal humerus and left glenohumeral joint from prior trauma. Enlarged prostate gland.   04/26/2016 Pathology Results   FoundationONE: Genomic alterations identified- KRAS Y81K, CREBBP splice site [id number], LRP1B S515*, JSH70 Y63*, ZCH88 splice site [id number], SPTA1 splice site [id number], TP53 C135Y.  Additional findings- MS-Stable, TMB-intermediate (14 Muts/Mb).  No reportable alterations- EGFR, ALK, BRAF, MET, ERBB2, RET, ROS1.   05/17/2016 -  Chemotherapy   The patient had ONDANSETRON IVPB [facility] +/- DEXAMETHASONE, , Intravenous,  Once, 0 of 1 cycle  pembrolizumab (KEYTRUDA) 200 mg in sodium chloride 0.9 % 50 mL chemo infusion, 200 mg, Intravenous, Once, 0 of 5 cycles  for chemotherapy treatment.     08/06/2016 PET scan   IMPRESSION: 1. Overall considerable improvement. The right upper lobe mass is markedly reduced in volume and SUV. Thoracic adenopathy have resolved and the hypermetabolic lesion in the right subscapularis muscle has also resolved. 2. The left upper lobe nodule is no longer appreciably hypermetabolic, and is highly indistinct although this may be due to motion artifact. Faint in  indistinct nodularity elsewhere in the lungs likewise not currently hypermetabolic. 3. There is some  very faint residual low-grade activity along the subcutaneous deposit along the upper left buttock region. Marked reduction in size and activity of the tumor deposit adjacent to the descending colon. 4. Prior right pleural effusion has resolved. 5. Stable appearance of the adrenal glands, with low-level activity and a nodule in the left adrenal gland. 6. Stable small nodule in the right breast, low-grade metabolic activity with maximum SUV 2.2 (formerly 3.0) 7. Other imaging findings of potential clinical significance: Severe arthropathy of the left glenohumeral joint. Coronary, aortic arch, and branch vessel atherosclerotic vascular disease. Aortoiliac atherosclerotic vascular disease. Prominent prostate gland indents the bladder base.    12/16/2018 -  Chemotherapy   The patient had palonosetron (ALOXI) injection 0.25 mg, 0.25 mg, Intravenous,  Once, 4 of 4 cycles Administration: 0.25 mg (12/16/2018), 0.25 mg (01/27/2019), 0.25 mg (03/03/2019), 0.25 mg (01/06/2019) pegfilgrastim (NEULASTA) injection 6 mg, 6 mg, Subcutaneous, Once, 2 of 2 cycles Administration: 6 mg (01/08/2019) pegfilgrastim (NEULASTA ONPRO KIT) injection 6 mg, 6 mg, Subcutaneous, Once, 2 of 2 cycles pegfilgrastim-cbqv (UDENYCA) injection 6 mg, 6 mg, Subcutaneous, Once, 2 of 2 cycles Administration: 6 mg (01/29/2019), 6 mg (03/05/2019) ondansetron (ZOFRAN) 8 mg in sodium chloride 0.9 % 50 mL IVPB, 8 mg (100 % of original dose 8 mg), Intravenous,  Once, 3 of 3 cycles Dose modification: 8 mg (original dose 8 mg, Cycle 5) Administration: 8 mg (03/24/2019), 8 mg (04/16/2019) PEMEtrexed (ALIMTA) 1,100 mg in sodium chloride 0.9 % 100 mL chemo infusion, 490 mg/m2 = 1,125 mg, Intravenous,  Once, 6 of 6 cycles Dose modification: 400 mg/m2 (80 % of original dose 500 mg/m2, Cycle 3, Reason: Other (see comments), Comment: rash, swelling) Administration: 1,100 mg (12/16/2018), 900 mg (01/27/2019), 900 mg (03/03/2019), 900 mg (03/24/2019),  900 mg (04/16/2019), 1,100 mg (01/06/2019) CARBOplatin (PARAPLATIN) 500 mg in sodium chloride 0.9 % 250 mL chemo infusion, 530 mg (110.7 % of original dose 477.5 mg), Intravenous,  Once, 4 of 4 cycles Dose modification:   (original dose 477.5 mg, Cycle 1),   (original dose 485.5 mg, Cycle 3),   (original dose 494 mg, Cycle 4),   (original dose 494 mg, Cycle 2) Administration: 500 mg (12/16/2018), 490 mg (01/27/2019), 490 mg (03/03/2019), 490 mg (01/06/2019) pembrolizumab (KEYTRUDA) 200 mg in sodium chloride 0.9 % 50 mL chemo infusion, 2 mg/kg = 200 mg, Intravenous, Once, 10 of 12 cycles Dose modification: 200 mg (original dose 2 mg/kg, Cycle 14, Reason: Provider Judgment) Administration: 200 mg (04/16/2019), 200 mg (05/31/2019), 200 mg (06/22/2019), 200 mg (07/13/2019), 200 mg (05/10/2019), 200 mg (08/03/2019), 200 mg (08/24/2019), 200 mg (09/14/2019), 200 mg (10/08/2019), 200 mg (10/29/2019) fosaprepitant (EMEND) 150 mg, dexamethasone (DECADRON) 12 mg in sodium chloride 0.9 % 145 mL IVPB, , Intravenous,  Once, 4 of 4 cycles Administration:  (12/16/2018),  (01/27/2019),  (03/03/2019),  (01/06/2019)  for chemotherapy treatment.    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  Two adjacent large masslike areas of consolidation within the right upper lobe with differential considerations including malignancy or pneumonia. Multiple enlarged right hilar and mediastinal lymph nodes which may be reactive or metastatic in etiology.  Additional indeterminate pulmonary nodules as above. Recommend attention on follow-up.  Indeterminate 2.9 cm left adrenal nodule. This needs dedicated evaluation with pre and post contrast-enhanced CT or MRI  after confirmation of pulmonary process.  Indeterminate nodule within the right breast. Recommend dedicated evaluation with mammography.  Hepatic  steatosis.   04/02/2016 Procedure   Ultrasound-guided core biopsy performed of a solid 1.5 cm soft tissue nodule in the right chest wall.   04/04/2016 Pathology Results   Diagnosis Soft Tissue Needle Core Biopsy, Right Chest Wall SMOOTH MUSCLE NEOPLASM Microscopic Comment The differential diagnosis include low grade leiomyosarcoma. The neoplasm shows spindle cell morphology with rare mitotic figures and minimal atypia, no necrosis present. These cells stains positive for smooth muscle actin, desmin, negative for cytokeratin AE1&3, ck8/18, s100, cd117, cd 34 and cd99. This case also reviewed by Dr. [name] and agree.      CANCER STAGING: Cancer Staging Primary cancer of right upper lobe of lung ([place]) Staging form: Lung, AJCC 8th Edition - Clinical: Stage IVB (cT3, cN2, cM1c) - Signed by [facility] Cancer, PA-C on 05/07/2016   INTERVAL HISTORY:  Mr. Darrell Christensen, a 74 y.o. male, returns for routine follow-up and consideration for next cycle of chemotherapy. Crit was last seen on 10/08/2019.  Due for cycle #16 of Keytruda today.   Today he reports feeling well and denies having any new issues since his last treatment. He has not taken Lasix recently, but is taking the gabapentin and magnesium daily. He denies having any diarrhea. His numbness in his fingertips and toes is stable.  Overall, he feels ready for next cycle of chemo today.    REVIEW OF SYSTEMS:  Review of Systems  Constitutional: Positive for appetite change (moderately decreased) and fatigue (depleted).  Respiratory: Positive for shortness of breath.   Cardiovascular: Positive for leg swelling.  Gastrointestinal: Positive for constipation.  Neurological: Positive for numbness (hands & feet).  All other systems reviewed and are negative.   PAST MEDICAL/SURGICAL HISTORY:  Past Medical History:  Diagnosis Date  . Adrenal mass, left ([place]) 04/01/2016  . Diabetes mellitus ([place]) 05/02/2016  . Diabetes mellitus  without complication ([place])   . Hypertension   . Mass of breast, right 04/01/2016  . Mass of upper lobe of right lung 04/01/2016   2 adjacent, 5 cm masses, hilar adenopathy  . Primary cancer of right upper lobe of lung ([place]) 04/01/2016   2 adjacent, 5 cm masses, hilar adenopathy  . Pulmonary embolus ([place]) 03/31/2016  . 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 COLONOSCOPY N/A 07/28/2017   Procedure: COLONOSCOPY;  Surgeon: Rogene Houston, MD;  Location: [facility] ENDO SUITE;  Service: Endoscopy;  Laterality: N/A;  205  . PORTACATH PLACEMENT Left 05/13/2016   Procedure: INSERTION PORT-A-CATH;  Surgeon: Aviva Signs, MD;  Location: [facility] ORS;  Service: General;  Laterality: Left;    SOCIAL HISTORY:  Social History   Socioeconomic History  . Marital status: Divorced    Spouse name: Not on file  . Number of children: Not on file  . Years of education: Not on file  . Highest education level: Not on file  Occupational History  . Not on file  Tobacco Use  . Smoking status: Former Smoker    Years: 61.00    Quit date: 04/11/2014    Years since quitting: 5.6  . Smokeless tobacco: Never Used  . Tobacco comment: patient does vape smoking x 2 years  Vaping Use  . Vaping Use: Every day  Substance and Sexual Activity  . Alcohol use: Not Currently    Comment: quit 10 years  . Drug use:  No  . Sexual activity: Not on file  Other Topics Concern  . Not on file  Social History Narrative  . Not on file   Social Determinants of Health   Financial Resource Strain:   . Difficulty of Paying Living Expenses:   Food Insecurity:   . Worried About Charity fundraiser in the Last Year:   . Arboriculturist in the Last Year:   Transportation Needs:   . Film/video editor (Medical):   Marland Kitchen Lack of Transportation (Non-Medical):   Physical Activity:   . Days of Exercise per Week:   . Minutes of Exercise per Session:   Stress:   . Feeling of Stress :   Social  Connections:   . Frequency of Communication with Friends and Family:   . Frequency of Social Gatherings with Friends and Family:   . Attends Religious Services:   . Active Member of Clubs or Organizations:   . Attends Archivist Meetings:   Marland Kitchen Marital Status:   Intimate Partner Violence:   . Fear of Current or Ex-Partner:   . Emotionally Abused:   Marland Kitchen Physically Abused:   . Sexually Abused:     FAMILY HISTORY:  Family History  Problem Relation Age of Onset  . Stroke Mother   . Heart attack Father   . Heart attack Brother     CURRENT MEDICATIONS:  Current Outpatient Medications  Medication Sig Dispense Refill  . amLODipine (NORVASC) 2.5 MG tablet Take 2.5 mg by mouth daily.  4  . CARBOPLATIN IV Inject into the vein every 21 ( twenty-one) days.    . DULoxetine (CYMBALTA) 60 MG capsule Take 60 mg by mouth daily.    . folic acid (FOLVITE) 1 MG tablet TAKE ONE TABLET BY MOUTH ONCE DAILY. START 5-7 DAYS BEFORE ALIMTA CHEMOTHERAPY AND CONTINUE FOR 21 DAYS AFTER CHEMOTHERAPY. 100 tablet 0  . furosemide (LASIX) 20 MG tablet Take 2 tablets (40 mg total) by mouth daily. 20 tablet 0  . gabapentin (NEURONTIN) 400 MG capsule Take 1 capsule (400 mg total) by mouth 3 (three) times daily. 90 capsule 3  . GLIPIZIDE XL 5 MG 24 hr tablet Take 5 mg by mouth daily.  4  . levocetirizine (XYZAL) 5 MG tablet Take 5 mg by mouth at bedtime.    . magnesium oxide (MAG-OX) 400 (241.3 Mg) MG tablet Take 1 tablet (400 mg total) by mouth 2 (two) times daily. 60 tablet 2  . metFORMIN (GLUCOPHAGE) 500 MG tablet Take 500 mg by mouth 2 (two) times daily.  4  . metoprolol succinate (TOPROL-XL) 25 MG 24 hr tablet TAKE ONE TABLET BY MOUTH IN THE EVENING. 30 tablet 0  . Pembrolizumab (KEYTRUDA IV) Inject into the vein.    Marland Kitchen PEMEtrexed 500 mg/m2 in sodium chloride 0.9 % 100 mL Inject into the vein every 21 ( twenty-one) days.    . potassium chloride (K-DUR,KLOR-CON) 10 MEQ tablet Take 10 mEq by mouth daily.   0   . simvastatin (ZOCOR) 20 MG tablet Take 20 mg by mouth every evening.  5  . torsemide (DEMADEX) 20 MG tablet Take 1 tablet (20 mg total) by mouth daily. 30 tablet 1  . ALPRAZolam (XANAX) 1 MG tablet One tab po 30 minutes prior to MRI or radiation (Patient not taking: Reported on 11/18/2019) 30 tablet 0  . hydrocortisone 2.5 % lotion Apply topically as needed.  (Patient not taking: Reported on 11/18/2019)    . lactulose (CHRONULAC) 10  GM/15ML solution Take 30 mLs (20 g total) by mouth at bedtime as needed for mild constipation. (Patient not taking: Reported on 11/18/2019) 480 mL 2  . lidocaine-prilocaine (EMLA) cream APPLY A QUARTER SIZE AMOUNT TO THE AFFECTED AREA 1 HOUR PRIOR TO COMING TO CHEMOTHERAPY. COVER WITH A PLASTIC WRAP. (Patient not taking: Reported on 11/18/2019) 30 g 0  . ondansetron (ZOFRAN) 4 MG tablet Take 1 tablet (4 mg total) by mouth every 8 (eight) hours as needed for nausea or vomiting. (Patient not taking: Reported on 11/18/2019) 4 tablet 0  . oxyCODONE-acetaminophen (PERCOCET) 10-325 MG tablet Take 1 tablet by mouth as needed.  (Patient not taking: Reported on 11/18/2019)  0  . prochlorperazine (COMPAZINE) 10 MG tablet Take 1 tablet (10 mg total) by mouth every 6 (six) hours as needed (Nausea or vomiting). (Patient not taking: Reported on 11/18/2019) 30 tablet 1   No current facility-administered medications for this visit.    ALLERGIES:  No Known Allergies  PHYSICAL EXAM:  Performance status (ECOG): 1 - Symptomatic but completely ambulatory  Vitals:   11/18/19 1011  BP: (!) 151/89  Pulse: 73  Resp: 18  Temp: 97.7 F (36.5 C)  SpO2: 95%   Wt Readings from Last 3 Encounters:  11/18/19 217 lb 6.4 oz (98.6 kg)  10/29/19 219 lb 3.2 oz (99.4 kg)  10/08/19 219 lb 14.4 oz (99.7 kg)   Physical Exam Vitals reviewed.  Constitutional:      Appearance: Normal appearance. He is obese.  Cardiovascular:     Rate and Rhythm: Normal rate and regular rhythm.     Pulses: Normal  pulses.     Heart sounds: Normal heart sounds.  Pulmonary:     Effort: Pulmonary effort is normal.     Breath sounds: Normal breath sounds.  Musculoskeletal:     Right lower leg: Edema (1+) present.     Left lower leg: Edema (1+) present.  Neurological:     General: No focal deficit present.     Mental Status: He is alert and oriented to person, place, and time.  Psychiatric:        Mood and Affect: Mood normal.        Behavior: Behavior normal.     LABORATORY DATA:  I have reviewed the labs as listed.  CBC Latest Ref Rng & Units 11/18/2019 10/29/2019 10/08/2019  WBC 4.0 - 10.5 K/uL 11.7(H) 12.3(H) 12.0(H)  Hemoglobin 13.0 - 17.0 g/dL 13.7 12.9(L) 13.4  Hematocrit 39 - 52 % 43.4 41.8 42.4  Platelets 150 - 400 K/uL 235 303 260   CMP Latest Ref Rng & Units 11/18/2019 10/29/2019 10/08/2019  Glucose 70 - 99 mg/dL 241(H) 126(H) 111(H)  BUN 8 - 23 mg/dL 22 19 28(H)  Creatinine 0.61 - 1.24 mg/dL 1.65(H) 1.40(H) 1.51(H)  Sodium 135 - 145 mmol/L 140 139 140  Potassium 3.5 - 5.1 mmol/L 4.0 3.8 4.1  Chloride 98 - 111 mmol/L 104 102 102  CO2 22 - 32 mmol/L '25 27 27  ' Calcium 8.9 - 10.3 mg/dL 9.0 8.6(L) 9.1  Total Protein 6.5 - 8.1 g/dL 7.0 7.1 7.2  Total Bilirubin 0.3 - 1.2 mg/dL 0.4 0.4 0.4  Alkaline Phos 38 - 126 U/L 82 94 89  AST 15 - 41 U/L 16 14(L) 12(L)  ALT 0 - 44 U/L '15 13 15    ' DIAGNOSTIC IMAGING:  I have independently reviewed the scans and discussed with the patient. No results found.   ASSESSMENT:  1. Stage IV adenocarcinoma  lung, PD-L1 50%, other actionable mutations negative: --Keytruda from 05/17/2016 through 09/24/2018 with mild progression. -4 cycles of carboplatin, pemetrexed from 12/25/2018 through 03/03/2019. -Pemetrexed maintenance from 03/24/2019 through 04/12/2019 discontinued secondary to renal insufficiency. -CT chest with contrast from 09/30/2019 shows right upper lobe lung lesion measuring 3.8 x 2.3)(4.2 x 2.6 cm).  No change in the left upper lobe nodule.   Subcarinal lymph node measures 11 mm, previously 7 mm.  No definite left hilar adenopathy.  Stable left adrenal nodule.  2.  Brain metastasis: -SRS to the right parietal lobe lesion with surrounding edema on 11/24/2018. -MRI of the brain on 09/16/2019 shows stable treated right parietal metastasis.  No new mass or abnormal enhancement.   PLAN:  1. Stage IV adenocarcinoma lung, PD-L1 50%, other actionable mutations negative: -I have reviewed previous CT scan results.  I have also reviewed his labs which showed normal CBC.  LFTs are normal.  Kidney function is stable. -We will proceed with his next cycle of Keytruda.  I will see him back in 3 weeks for follow-up.  I plan to repeat CT scan in 3 months.  2. Brain metastasis: -MRI of the brain on 09/16/2019 showed stable treated right parietal metastasis.  Repeat in 3 months.  3. Itching rash: -Dry skin with itching on the lower extremities.  Continue to apply moisturizing lotion.  4. Peripheral neuropathy: -Continue gabapentin 400 mg 3 times daily and duloxetine 30 mg daily.  5. Hypomagnesemia: -Continue magnesium twice daily.  Magnesium today is 2.1.   Orders placed this encounter:  No orders of the defined types were placed in this encounter.    Derek Jack, MD [place] [phone]   I, Milinda Antis, am acting as a scribe for Dr. Sanda Linger.  I, Derek Jack MD, have reviewed the above documentation for accuracy and completeness, and I agree with the above.

## 2019-11-18 NOTE — Progress Notes (Signed)
Ok to treat today verbal order Dr. Delton Coombes.    Patient tolerated chemotherapy with no complaints voiced.  Side effects with management reviewed with understanding verbalized.  Port site clean and dry with no bruising or swelling noted at site.  Good blood return noted before and after administration of chemotherapy.  Band aid applied.  Patient left in satisfactory condition with VSS and no s/s of distress noted.

## 2019-12-09 ENCOUNTER — Inpatient Hospital Stay (HOSPITAL_COMMUNITY): Payer: Medicare Other | Attending: Hematology

## 2019-12-09 ENCOUNTER — Inpatient Hospital Stay (HOSPITAL_COMMUNITY): Payer: Medicare Other

## 2019-12-09 ENCOUNTER — Inpatient Hospital Stay (HOSPITAL_COMMUNITY): Payer: Medicare Other | Admitting: Hematology

## 2019-12-09 ENCOUNTER — Other Ambulatory Visit: Payer: Self-pay

## 2019-12-09 VITALS — BP 131/70 | HR 78 | Temp 97.5°F | Resp 18

## 2019-12-09 VITALS — BP 146/97

## 2019-12-09 DIAGNOSIS — C3411 Malignant neoplasm of upper lobe, right bronchus or lung: Secondary | ICD-10-CM

## 2019-12-09 DIAGNOSIS — Z5112 Encounter for antineoplastic immunotherapy: Secondary | ICD-10-CM | POA: Diagnosis not present

## 2019-12-09 DIAGNOSIS — C7931 Secondary malignant neoplasm of brain: Secondary | ICD-10-CM | POA: Diagnosis not present

## 2019-12-09 DIAGNOSIS — Z79899 Other long term (current) drug therapy: Secondary | ICD-10-CM | POA: Insufficient documentation

## 2019-12-09 LAB — COMPREHENSIVE METABOLIC PANEL
ALT: 15 U/L (ref 0–44)
AST: 16 U/L (ref 15–41)
Albumin: 4 g/dL (ref 3.5–5.0)
Alkaline Phosphatase: 74 U/L (ref 38–126)
Anion gap: 12 (ref 5–15)
BUN: 20 mg/dL (ref 8–23)
CO2: 25 mmol/L (ref 22–32)
Calcium: 8.9 mg/dL (ref 8.9–10.3)
Chloride: 102 mmol/L (ref 98–111)
Creatinine, Ser: 1.39 mg/dL — ABNORMAL HIGH (ref 0.61–1.24)
GFR calc Af Amer: 57 mL/min — ABNORMAL LOW (ref >60)
GFR calc non Af Amer: 49 mL/min — ABNORMAL LOW (ref >60)
Glucose, Bld: 114 mg/dL — ABNORMAL HIGH (ref 70–99)
Potassium: 3.9 mmol/L (ref 3.5–5.1)
Sodium: 139 mmol/L (ref 135–145)
Total Bilirubin: 0.5 mg/dL (ref 0.3–1.2)
Total Protein: 7 g/dL (ref 6.5–8.1)

## 2019-12-09 LAB — CBC WITH DIFFERENTIAL/PLATELET
Abs Immature Granulocytes: 0.04 10*3/uL (ref 0.00–0.07)
Basophils Absolute: 0.1 10*3/uL (ref 0.0–0.1)
Basophils Relative: 1 %
Eosinophils Absolute: 0.5 10*3/uL (ref 0.0–0.5)
Eosinophils Relative: 5 %
HCT: 43.2 % (ref 39.0–52.0)
Hemoglobin: 14 g/dL (ref 13.0–17.0)
Immature Granulocytes: 0 %
Lymphocytes Relative: 42 %
Lymphs Abs: 4.3 10*3/uL — ABNORMAL HIGH (ref 0.7–4.0)
MCH: 29.7 pg (ref 26.0–34.0)
MCHC: 32.4 g/dL (ref 30.0–36.0)
MCV: 91.5 fL (ref 80.0–100.0)
Monocytes Absolute: 0.7 10*3/uL (ref 0.1–1.0)
Monocytes Relative: 7 %
Neutro Abs: 4.6 10*3/uL (ref 1.7–7.7)
Neutrophils Relative %: 45 %
Platelets: 243 10*3/uL (ref 150–400)
RBC: 4.72 MIL/uL (ref 4.22–5.81)
RDW: 14.7 % (ref 11.5–15.5)
WBC: 10.3 10*3/uL (ref 4.0–10.5)
nRBC: 0 % (ref 0.0–0.2)

## 2019-12-09 LAB — TSH: TSH: 3.05 u[IU]/mL (ref 0.350–4.500)

## 2019-12-09 MED ORDER — SODIUM CHLORIDE 0.9 % IV SOLN
200.0000 mg | Freq: Once | INTRAVENOUS | Status: AC
  Administered 2019-12-09: 200 mg via INTRAVENOUS
  Filled 2019-12-09: qty 8

## 2019-12-09 MED ORDER — SODIUM CHLORIDE 0.9 % IV SOLN: Freq: Once | INTRAVENOUS | Status: AC

## 2019-12-09 MED ORDER — SODIUM CHLORIDE 0.9% FLUSH
10.0000 mL | INTRAVENOUS | Status: DC | PRN
  Administered 2019-12-09: 10 mL

## 2019-12-09 MED ORDER — HEPARIN SOD (PORK) LOCK FLUSH 100 UNIT/ML IV SOLN
500.0000 [IU] | Freq: Once | INTRAVENOUS | Status: AC | PRN
  Administered 2019-12-09: 500 [IU]

## 2019-12-09 NOTE — Progress Notes (Signed)
Patient has been assessed, vital signs and labs have been reviewed by Dr. Katragadda. ANC, Creatinine, LFTs, and Platelets are within treatment parameters per Dr. Katragadda. The patient is good to proceed with treatment at this time.  

## 2019-12-09 NOTE — Progress Notes (Signed)
Reviewed blood pressures with Dr. Delton Coombes with verbal order ok to treat today.   Patient tolerated therapy with no complaints voiced.  Side effects with management reviewed with understanding verbalized.  Port site clean and dry with no bruising or swelling noted at site.  Good blood return noted before and after administration of therapy.  Band aid applied.  Patient left in satisfactory condition with VSS and no s/s of distress noted.

## 2019-12-09 NOTE — Patient Instructions (Signed)
[place] at [facility] Discharge Instructions  You were seen today by Dr. Delton Coombes. He went over your recent results. You received your treatment today. You will be scheduled for a CT scan of your chest before your next visit. Dr. Delton Coombes will see you back in 3 weeks for labs and follow up.   Thank you for choosing [place] at [facility] to provide your oncology and hematology care.  To afford each patient quality time with our provider, please arrive at least 15 minutes before your scheduled appointment time.   If you have a lab appointment with the [facility] please come in thru the Main Entrance and check in at the main information desk  You need to re-schedule your appointment should you arrive 10 or more minutes late.  We strive to give you quality time with our providers, and arriving late affects you and other patients whose appointments are after yours.  Also, if you no show three or more times for appointments you may be dismissed from the clinic at the providers discretion.     Again, thank you for choosing [facility].  Our hope is that these requests will decrease the amount of time that you wait before being seen by our physicians.       _____________________________________________________________  Should you have questions after your visit to [facility], please contact our office at [phone] between the hours of 8:00 a.m. and 4:30 p.m.  Voicemails left after 4:00 p.m. will not be returned until the following business day.  For prescription refill requests, have your pharmacy contact our office and allow 72 hours.    Cancer Center Support Programs:   > Cancer Support Group  2nd Tuesday of the month 1pm-2pm, Journey Room

## 2019-12-09 NOTE — Progress Notes (Signed)
[facility] [address], [place] [address]   CLINIC:  Medical Oncology/Hematology  PCP:  Celene Squibb, MD [address] Liana Cro[place] [address] [phone]   REASON FOR VISIT:  Follow-up for stage IV adenocarcinoma of right lung  PRIOR THERAPY:  1. Keytruda from 05/17/2016 to 09/24/2018. 2. Carboplatin, pemetrexed x 4 cycles from 12/25/2018 to 03/03/2019.  NGS Results: PD-L1 50%  CURRENT THERAPY: Keytruda  BRIEF ONCOLOGIC HISTORY:  Oncology History  Primary cancer of right upper lobe of lung ([place])  05/14/2015 Procedure   Port placement by Dr. Arnoldo Morale   04/01/2016 Initial Diagnosis   Primary cancer of right upper lobe of lung ([place])   04/02/2016 Procedure   Ultrasound-guided core biopsy performed of a solid 1.5 cm soft tissue nodule in the right chest wall.   04/03/2016 Imaging   MRI brain- No acute and intracranial process or metastasis.  Moderate chronic small vessel ischemic disease.   04/04/2016 Pathology Results   Diagnosis Soft Tissue Needle Core Biopsy, Right Chest Wall SMOOTH MUSCLE NEOPLASM Microscopic Comment The differential diagnosis include low grade leiomyosarcoma. The neoplasm shows spindle cell morphology with rare mitotic figures and minimal atypia, no necrosis present. These cells stains positive for smooth muscle actin, desmin, negative for cytokeratin AE1&3, ck8/18, s100, cd117, cd 34 and cd99. This case also reviewed by Dr. [name] and agree.   04/05/2016 Procedure   CT-guided biopsy of right upper lobe lesion, with tissue specimen sent to pathology, by IR.   04/09/2016 Pathology Results   Lung, needle/core biopsy(ies), Right Upper Lobe - NON SMALL CELL CARCINOMA.   04/17/2016 Pathology Results   PDL1 High Expression.  TPS 50%.   04/24/2016 PET scan   1. Prominently hypermetabolic large right upper lobe mass with metastatic disease to the right paratracheal and right hilar nodal chains, to the descending  mesocolon, to the left upper buttock subcutaneous tissues, to the left upper lobe, to the right subscapularis muscle, and possibly to the left adrenal gland and right breast subcutaneous tissues. Appearance compatible with stage IV lung cancer. 2. There is some focal high activity in the ascending colon which is probably from peristalsis, less likely from a local colon mass. This does raise the possibility that the adjacent mesocolon tumor implant could be due to synchronous colon cancer rather than lung metastasis. 3. Other imaging findings of potential clinical significance: Coronary, aortic arch, and branch vessel atherosclerotic vascular disease. Aortoiliac atherosclerotic vascular disease. Deformity left proximal humerus and left glenohumeral joint from prior trauma. Enlarged prostate gland.   04/26/2016 Pathology Results   FoundationONE: Genomic alterations identified- KRAS O53G, CREBBP splice site [id number], LRP1B S515*, QQV95 G38*, VFI43 splice site [id number], SPTA1 splice site [id number], TP53 C135Y.  Additional findings- MS-Stable, TMB-intermediate (14 Muts/Mb).  No reportable alterations- EGFR, ALK, BRAF, MET, ERBB2, RET, ROS1.   05/17/2016 -  Chemotherapy   The patient had ONDANSETRON IVPB [facility] +/- DEXAMETHASONE, , Intravenous,  Once, 0 of 1 cycle  pembrolizumab (KEYTRUDA) 200 mg in sodium chloride 0.9 % 50 mL chemo infusion, 200 mg, Intravenous, Once, 0 of 5 cycles  for chemotherapy treatment.     08/06/2016 PET scan   IMPRESSION: 1. Overall considerable improvement. The right upper lobe mass is markedly reduced in volume and SUV. Thoracic adenopathy have resolved and the hypermetabolic lesion in the right subscapularis muscle has also resolved. 2. The left upper lobe nodule is no longer appreciably hypermetabolic, and is highly indistinct although this may be due  to motion artifact. Faint in indistinct nodularity elsewhere in the lungs likewise not currently  hypermetabolic. 3. There is some very faint residual low-grade activity along the subcutaneous deposit along the upper left buttock region. Marked reduction in size and activity of the tumor deposit adjacent to the descending colon. 4. Prior right pleural effusion has resolved. 5. Stable appearance of the adrenal glands, with low-level activity and a nodule in the left adrenal gland. 6. Stable small nodule in the right breast, low-grade metabolic activity with maximum SUV 2.2 (formerly 3.0) 7. Other imaging findings of potential clinical significance: Severe arthropathy of the left glenohumeral joint. Coronary, aortic arch, and branch vessel atherosclerotic vascular disease. Aortoiliac atherosclerotic vascular disease. Prominent prostate gland indents the bladder base.    12/16/2018 -  Chemotherapy   The patient had palonosetron (ALOXI) injection 0.25 mg, 0.25 mg, Intravenous,  Once, 4 of 4 cycles Administration: 0.25 mg (12/16/2018), 0.25 mg (01/27/2019), 0.25 mg (03/03/2019), 0.25 mg (01/06/2019) pegfilgrastim (NEULASTA) injection 6 mg, 6 mg, Subcutaneous, Once, 2 of 2 cycles Administration: 6 mg (01/08/2019) pegfilgrastim (NEULASTA ONPRO KIT) injection 6 mg, 6 mg, Subcutaneous, Once, 2 of 2 cycles pegfilgrastim-cbqv (UDENYCA) injection 6 mg, 6 mg, Subcutaneous, Once, 2 of 2 cycles Administration: 6 mg (01/29/2019), 6 mg (03/05/2019) ondansetron (ZOFRAN) 8 mg in sodium chloride 0.9 % 50 mL IVPB, 8 mg (100 % of original dose 8 mg), Intravenous,  Once, 3 of 3 cycles Dose modification: 8 mg (original dose 8 mg, Cycle 5) Administration: 8 mg (03/24/2019), 8 mg (04/16/2019) PEMEtrexed (ALIMTA) 1,100 mg in sodium chloride 0.9 % 100 mL chemo infusion, 490 mg/m2 = 1,125 mg, Intravenous,  Once, 6 of 6 cycles Dose modification: 400 mg/m2 (80 % of original dose 500 mg/m2, Cycle 3, Reason: Other (see comments), Comment: rash, swelling) Administration: 1,100 mg (12/16/2018), 900 mg (01/27/2019), 900 mg  (03/03/2019), 900 mg (03/24/2019), 900 mg (04/16/2019), 1,100 mg (01/06/2019) CARBOplatin (PARAPLATIN) 500 mg in sodium chloride 0.9 % 250 mL chemo infusion, 530 mg (110.7 % of original dose 477.5 mg), Intravenous,  Once, 4 of 4 cycles Dose modification:   (original dose 477.5 mg, Cycle 1),   (original dose 485.5 mg, Cycle 3),   (original dose 494 mg, Cycle 4),   (original dose 494 mg, Cycle 2) Administration: 500 mg (12/16/2018), 490 mg (01/27/2019), 490 mg (03/03/2019), 490 mg (01/06/2019) pembrolizumab (KEYTRUDA) 200 mg in sodium chloride 0.9 % 50 mL chemo infusion, 2 mg/kg = 200 mg, Intravenous, Once, 11 of 13 cycles Dose modification: 200 mg (original dose 2 mg/kg, Cycle 14, Reason: Provider Judgment) Administration: 200 mg (04/16/2019), 200 mg (05/31/2019), 200 mg (06/22/2019), 200 mg (07/13/2019), 200 mg (05/10/2019), 200 mg (08/03/2019), 200 mg (08/24/2019), 200 mg (09/14/2019), 200 mg (10/08/2019), 200 mg (10/29/2019), 200 mg (11/18/2019) fosaprepitant (EMEND) 150 mg, dexamethasone (DECADRON) 12 mg in sodium chloride 0.9 % 145 mL IVPB, , Intravenous,  Once, 4 of 4 cycles Administration:  (12/16/2018),  (01/27/2019),  (03/03/2019),  (01/06/2019)  for chemotherapy treatment.    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  Two adjacent large masslike areas of consolidation within the right upper lobe with differential considerations including malignancy or pneumonia. Multiple enlarged right hilar and mediastinal lymph nodes which may be reactive or metastatic in etiology.  Additional indeterminate pulmonary nodules as above. Recommend attention on follow-up.  Indeterminate 2.9 cm left adrenal nodule. This needs dedicated evaluation  with pre and post contrast-enhanced CT or MRI after confirmation of pulmonary process.  Indeterminate nodule within the right breast. Recommend  dedicated evaluation with mammography.  Hepatic steatosis.   04/02/2016 Procedure   Ultrasound-guided core biopsy performed of a solid 1.5 cm soft tissue nodule in the right chest wall.   04/04/2016 Pathology Results   Diagnosis Soft Tissue Needle Core Biopsy, Right Chest Wall SMOOTH MUSCLE NEOPLASM Microscopic Comment The differential diagnosis include low grade leiomyosarcoma. The neoplasm shows spindle cell morphology with rare mitotic figures and minimal atypia, no necrosis present. These cells stains positive for smooth muscle actin, desmin, negative for cytokeratin AE1&3, ck8/18, s100, cd117, cd 34 and cd99. This case also reviewed by Dr. [name] and agree.      CANCER STAGING: Cancer Staging Primary cancer of right upper lobe of lung ([place]) Staging form: Lung, AJCC 8th Edition - Clinical: Stage IVB (cT3, cN2, cM1c) - Signed by Baird Cancer, PA-C on 05/07/2016   INTERVAL HISTORY:  Mr. CAVION FAIOLA, a 74 y.o. male, returns for routine follow-up and consideration for next cycle of chemotherapy. Darrell Christensen was last seen on 11/18/2019.  Due for cycle #17 of Keytruda today.   Today he is accompanied by his wife. Overall, he tells me he has been feeling pretty well. He denies having any headaches, weakness or vision changes. He complains of constipation and takes magnesium BID. He has not been as active recently and been eating out more often.  He will get an MRI head on 8/19.  Overall, he feels ready for next cycle of chemo today.    REVIEW OF SYSTEMS:  Review of Systems  Constitutional: Positive for appetite change (severely decreased) and fatigue (severe).  Eyes: Negative for eye problems.  Respiratory: Positive for cough and shortness of breath.   Cardiovascular: Positive for chest pain.  Gastrointestinal: Positive for constipation.  Skin: Positive for itching. Negative for rash.  Neurological: Positive for numbness (hands & feet). Negative for extremity weakness  and headaches.  All other systems reviewed and are negative.   PAST MEDICAL/SURGICAL HISTORY:  Past Medical History:  Diagnosis Date  . Adrenal mass, left ([place]) 04/01/2016  . Diabetes mellitus ([place]) 05/02/2016  . Diabetes mellitus without complication ([place])   . Hypertension   . Mass of breast, right 04/01/2016  . Mass of upper lobe of right lung 04/01/2016   2 adjacent, 5 cm masses, hilar adenopathy  . Primary cancer of right upper lobe of lung ([place] [place]) 04/01/2016   2 adjacent, 5 cm masses, hilar adenopathy  . Pulmonary embolus ([place]) 03/31/2016  . 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 COLONOSCOPY N/A 07/28/2017   Procedure: COLONOSCOPY;  Surgeon: Rogene Houston, MD;  Location: [facility] ENDO SUITE;  Service: Endoscopy;  Laterality: N/A;  205  . PORTACATH PLACEMENT Left 05/13/2016   Procedure: INSERTION PORT-A-CATH;  Surgeon: Aviva Signs, MD;  Location: [facility] ORS;  Service: General;  Laterality: Left;    SOCIAL HISTORY:  Social History   Socioeconomic History  . Marital status: Divorced    Spouse name: Not on file  . Number of children: Not on file  . Years of education: Not on file  . Highest education level: Not on file  Occupational History  . Not on file  Tobacco Use  . Smoking status: Former Smoker    Years: 61.00    Quit date: 04/11/2014    Years since quitting: 5.6  . Smokeless tobacco: Never  Used  . Tobacco comment: patient does vape smoking x 2 years  Vaping Use  . Vaping Use: Every day  Substance and Sexual Activity  . Alcohol use: Not Currently    Comment: quit 10 years  . Drug use: No  . Sexual activity: Not on file  Other Topics Concern  . Not on file  Social History Narrative  . Not on file   Social Determinants of Health   Financial Resource Strain:   . Difficulty of Paying Living Expenses:   Food Insecurity:   . Worried About Charity fundraiser in the Last Year:   . Arboriculturist in the Last Year:    Transportation Needs:   . Film/video editor (Medical):   Marland Kitchen Lack of Transportation (Non-Medical):   Physical Activity:   . Days of Exercise per Week:   . Minutes of Exercise per Session:   Stress:   . Feeling of Stress :   Social Connections:   . Frequency of Communication with Friends and Family:   . Frequency of Social Gatherings with Friends and Family:   . Attends Religious Services:   . Active Member of Clubs or Organizations:   . Attends Archivist Meetings:   Marland Kitchen Marital Status:   Intimate Partner Violence:   . Fear of Current or Ex-Partner:   . Emotionally Abused:   Marland Kitchen Physically Abused:   . Sexually Abused:     FAMILY HISTORY:  Family History  Problem Relation Age of Onset  . Stroke Mother   . Heart attack Father   . Heart attack Brother     CURRENT MEDICATIONS:  Current Outpatient Medications  Medication Sig Dispense Refill  . amLODipine (NORVASC) 2.5 MG tablet Take 2.5 mg by mouth daily.  4  . CARBOPLATIN IV Inject into the vein every 21 ( twenty-one) days.    . DULoxetine (CYMBALTA) 60 MG capsule Take 60 mg by mouth daily.    . folic acid (FOLVITE) 1 MG tablet TAKE ONE TABLET BY MOUTH ONCE DAILY. START 5-7 DAYS BEFORE ALIMTA CHEMOTHERAPY AND CONTINUE FOR 21 DAYS AFTER CHEMOTHERAPY. 100 tablet 0  . furosemide (LASIX) 20 MG tablet Take 2 tablets (40 mg total) by mouth daily. 20 tablet 0  . gabapentin (NEURONTIN) 400 MG capsule Take 1 capsule (400 mg total) by mouth 3 (three) times daily. 90 capsule 3  . GLIPIZIDE XL 5 MG 24 hr tablet Take 5 mg by mouth daily.  4  . hydrocortisone 2.5 % lotion Apply topically as needed.     . lactulose (CHRONULAC) 10 GM/15ML solution Take 30 mLs (20 g total) by mouth at bedtime as needed for mild constipation. 480 mL 2  . levocetirizine (XYZAL) 5 MG tablet Take 5 mg by mouth at bedtime.    . magnesium oxide (MAG-OX) 400 (241.3 Mg) MG tablet Take 1 tablet (400 mg total) by mouth 2 (two) times daily. 60 tablet 2  .  metFORMIN (GLUCOPHAGE) 500 MG tablet Take 500 mg by mouth 2 (two) times daily.  4  . metoprolol succinate (TOPROL-XL) 25 MG 24 hr tablet TAKE ONE TABLET BY MOUTH IN THE EVENING. 30 tablet 0  . oxyCODONE-acetaminophen (PERCOCET) 10-325 MG tablet Take 1 tablet by mouth as needed.   0  . Pembrolizumab (KEYTRUDA IV) Inject into the vein.    Marland Kitchen PEMEtrexed 500 mg/m2 in sodium chloride 0.9 % 100 mL Inject into the vein every 21 ( twenty-one) days.    . potassium chloride (  K-DUR,KLOR-CON) 10 MEQ tablet Take 10 mEq by mouth daily.   0  . prochlorperazine (COMPAZINE) 10 MG tablet Take 1 tablet (10 mg total) by mouth every 6 (six) hours as needed (Nausea or vomiting). 30 tablet 1  . simvastatin (ZOCOR) 20 MG tablet Take 20 mg by mouth every evening.  5  . torsemide (DEMADEX) 20 MG tablet Take 1 tablet (20 mg total) by mouth daily. 30 tablet 1  . ALPRAZolam (XANAX) 1 MG tablet One tab po 30 minutes prior to MRI or radiation (Patient not taking: Reported on 12/09/2019) 30 tablet 0  . lidocaine-prilocaine (EMLA) cream APPLY A QUARTER SIZE AMOUNT TO THE AFFECTED AREA 1 HOUR PRIOR TO COMING TO CHEMOTHERAPY. COVER WITH A PLASTIC WRAP. (Patient not taking: Reported on 12/09/2019) 30 g 0  . ondansetron (ZOFRAN) 4 MG tablet Take 1 tablet (4 mg total) by mouth every 8 (eight) hours as needed for nausea or vomiting. (Patient not taking: Reported on 12/09/2019) 4 tablet 0   No current facility-administered medications for this visit.    ALLERGIES:  No Known Allergies  PHYSICAL EXAM:  Performance status (ECOG): 1 - Symptomatic but completely ambulatory  Vitals:   12/09/19 1141  BP: (!) 146/97   Wt Readings from Last 3 Encounters:  11/18/19 217 lb 6.4 oz (98.6 kg)  10/29/19 219 lb 3.2 oz (99.4 kg)  10/08/19 219 lb 14.4 oz (99.7 kg)   Physical Exam Vitals reviewed.  Constitutional:      Appearance: Normal appearance. He is obese.  Cardiovascular:     Rate and Rhythm: Normal rate and regular rhythm.     Pulses:  Normal pulses.     Heart sounds: Normal heart sounds.  Pulmonary:     Effort: Pulmonary effort is normal.     Breath sounds: Normal breath sounds.  Musculoskeletal:     Right lower leg: Edema (trace) present.     Left lower leg: Edema (trace) present.  Neurological:     General: No focal deficit present.     Mental Status: He is alert and oriented to person, place, and time.  Psychiatric:        Mood and Affect: Mood normal.        Behavior: Behavior normal.     LABORATORY DATA:  I have reviewed the labs as listed.  CBC Latest Ref Rng & Units 12/09/2019 11/18/2019 10/29/2019  WBC 4.0 - 10.5 K/uL 10.3 11.7(H) 12.3(H)  Hemoglobin 13.0 - 17.0 g/dL 14.0 13.7 12.9(L)  Hematocrit 39 - 52 % 43.2 43.4 41.8  Platelets 150 - 400 K/uL 243 235 303   CMP Latest Ref Rng & Units 12/09/2019 11/18/2019 10/29/2019  Glucose 70 - 99 mg/dL 114(H) 241(H) 126(H)  BUN 8 - 23 mg/dL '20 22 19  ' Creatinine 0.61 - 1.24 mg/dL 1.39(H) 1.65(H) 1.40(H)  Sodium 135 - 145 mmol/L 139 140 139  Potassium 3.5 - 5.1 mmol/L 3.9 4.0 3.8  Chloride 98 - 111 mmol/L 102 104 102  CO2 22 - 32 mmol/L '25 25 27  ' Calcium 8.9 - 10.3 mg/dL 8.9 9.0 8.6(L)  Total Protein 6.5 - 8.1 g/dL 7.0 7.0 7.1  Total Bilirubin 0.3 - 1.2 mg/dL 0.5 0.4 0.4  Alkaline Phos 38 - 126 U/L 74 82 94  AST 15 - 41 U/L 16 16 14(L)  ALT 0 - 44 U/L '15 15 13    ' DIAGNOSTIC IMAGING:  I have independently reviewed the scans and discussed with the patient. No results found.   ASSESSMENT:  1. Stage IV adenocarcinoma lung, PD-L1 50%, other actionable mutations negative: --Keytruda from 05/17/2016 through 09/24/2018 with mild progression. -4 cycles of carboplatin, pemetrexed from 12/25/2018 through 03/03/2019. -Pemetrexed maintenance from 03/24/2019 through 04/12/2019 discontinued secondary to renal insufficiency. -CT chest with contrast from 09/30/2019 shows right upper lobe lung lesion measuring 3.8 x 2.3)(4.2 x 2.6 cm). No change in the left upper lobe nodule.  Subcarinal lymph node measures 11 mm, previously 7 mm. No definite left hilar adenopathy. Stable left adrenal nodule.  2. Brain metastasis: -SRS to the right parietal lobe lesion with surrounding edema on 11/24/2018. -MRI of the brain on 09/16/2019 shows stable treated right parietal metastasis. No new mass or abnormal enhancement.   PLAN:  1. Stage IV adenocarcinoma lung, PD-L1 50%, other actionable mutations negative: -He does not report any immunotherapy related side effects. -I reviewed labs from today which showed normal CBC and LFTs.  Creatinine is 1.39. -I will proceed with immunotherapy today. -I will see him back in 3 weeks.  I plan to repeat CT scan of the chest prior to next visit.  2. Brain metastasis: -He does not have any headaches.  Repeat MRI scheduled on 12/23/2019.  3. Itching rash: -Dry skin and itching on the lower extremities is stable.  Continue moisturizing lotion.  4. Peripheral neuropathy: -Continue gabapentin 400 mg 3 times daily and duloxetine 30 mg daily.  5. Hypomagnesemia: -Continue magnesium twice daily.   Orders placed this encounter:  No orders of the defined types were placed in this encounter.    Derek Jack, MD [place] [phone]   I, Milinda Antis, am acting as a scribe for Dr. Sanda Linger.  I, Derek Jack MD, have reviewed the above documentation for accuracy and completeness, and I agree with the above.

## 2019-12-23 ENCOUNTER — Ambulatory Visit (HOSPITAL_COMMUNITY)
Admission: RE | Admit: 2019-12-23 | Discharge: 2019-12-23 | Disposition: A | Discharge status: Home / Self Care | Payer: Medicare Other | Source: Ambulatory Visit | Attending: Radiation Oncology | Admitting: Radiation Oncology

## 2019-12-23 ENCOUNTER — Encounter: Payer: Self-pay | Admitting: Radiation Oncology

## 2019-12-23 ENCOUNTER — Other Ambulatory Visit: Payer: Self-pay

## 2019-12-23 DIAGNOSIS — C7931 Secondary malignant neoplasm of brain: Secondary | ICD-10-CM | POA: Diagnosis not present

## 2019-12-23 DIAGNOSIS — G319 Degenerative disease of nervous system, unspecified: Secondary | ICD-10-CM | POA: Diagnosis not present

## 2019-12-23 DIAGNOSIS — J341 Cyst and mucocele of nose and nasal sinus: Secondary | ICD-10-CM | POA: Diagnosis not present

## 2019-12-23 DIAGNOSIS — G9389 Other specified disorders of brain: Secondary | ICD-10-CM | POA: Diagnosis not present

## 2019-12-23 MED ORDER — GADOBUTROL 1 MMOL/ML IV SOLN
10.0000 mL | Freq: Once | INTRAVENOUS | Status: AC | PRN
  Administered 2019-12-23: 10 mL via INTRAVENOUS

## 2019-12-27 ENCOUNTER — Inpatient Hospital Stay: Payer: Medicare Other | Attending: Radiation Oncology

## 2019-12-27 ENCOUNTER — Ambulatory Visit (HOSPITAL_COMMUNITY): Payer: Medicare Other

## 2019-12-27 ENCOUNTER — Ambulatory Visit
Admission: RE | Admit: 2019-12-27 | Discharge: 2019-12-27 | Disposition: A | Discharge status: Home / Self Care | Payer: Medicare Other | Source: Ambulatory Visit | Attending: Radiation Oncology | Admitting: Radiation Oncology

## 2019-12-27 ENCOUNTER — Other Ambulatory Visit: Payer: Self-pay

## 2019-12-27 DIAGNOSIS — Z85118 Personal history of other malignant neoplasm of bronchus and lung: Secondary | ICD-10-CM | POA: Diagnosis not present

## 2019-12-27 DIAGNOSIS — C7931 Secondary malignant neoplasm of brain: Secondary | ICD-10-CM

## 2019-12-27 DIAGNOSIS — C3411 Malignant neoplasm of upper lobe, right bronchus or lung: Secondary | ICD-10-CM

## 2019-12-27 DIAGNOSIS — Z08 Encounter for follow-up examination after completed treatment for malignant neoplasm: Secondary | ICD-10-CM | POA: Diagnosis not present

## 2019-12-27 DIAGNOSIS — R197 Diarrhea, unspecified: Secondary | ICD-10-CM | POA: Diagnosis not present

## 2019-12-27 NOTE — Progress Notes (Signed)
Radiation Oncology         [phone] ________________________________  Outpatient Follow Up - Conducted via telephone due to current COVID-19 concerns for limiting patient exposure  I spoke with the patient to conduct this consult visit via telephone to spare the patient unnecessary potential exposure in the healthcare setting during the current COVID-19 pandemic. The patient was notified in advance and was offered a [place] meeting to allow for face to face communication but unfortunately reported that they did not have the appropriate resources/technology to support such a visit and instead preferred to proceed with a telephone visit. ________________________________  Name: Darrell Christensen MRN: [id number]  Date of Service: 12/27/2019  DOB: 12-Apr-1946   Diagnosis:   Progressive Metastatic Stage IV NSCLC, adenocarcinoma of the RUL with new brain metastasis.  Interval Since Last Radiation: 13 months  11/18/2018-11/24/2018 [facility] Treatment:  PTV1 Left Parietal 17 mm received 27 Gy in 3 fractions  Narrative:  Darrell Christensen is a pleasant 74 y.o. gentleman with a history of Stage IV, NSCLC, adenocarcinoma of the RUL.  He presented in 2017 with stage IV disease and received Keytruda between January 2018 and Sep 24, 2018 when he was found to have progressive disease in the right upper lobe.  He was seen by Dr. Julien Nordmann for a second opinion and was encouraged to consider chemoimmunotherapy for 4 cycles with carboplatin and pemetrexed along with his Keytruda.  He was in the midst of getting this authorized, and presented with slurred speech after a fall.  He had been having approximately 3 weeks of headaches as well, and was sent for an MRI of the brain yesterday.  He received 1 mg of IV Ativan through his Port-A-Cath.  His scan revealed a 22 mm brain metastasis in the posterior aspect of the right temporal lobe with significant vasogenic edema in the right hemisphere.  Only mild regional mass-effect was noted  without any midline shift.  He proceeded with stereotactic radiosurgery in 3 fractions which he completed in July 2020.  He has since been on systemic therapy with Dr. Delton Coombes and he has been receiving immunotherapy. A recent MRI of the brain on 12/23/19 revealed post treatment changes and after discussion these were not felt to be of concern. He did not have any new lesions. He's contacted today by phone to review these results.   On review of systems, the patient reports that he is doing well overall. He denies any headaches, visual or auditory disturbances. He denies any uncontrolled movement or gait abnormalities. He had noticed three days of diarrhea and has been taking imodium for this, prior to three days ago though he was constipated. No other complaints are verbalized.   Past Medical History:  Past Medical History:  Diagnosis Date  . Adrenal mass, left ([place]) 04/01/2016  . Diabetes mellitus ([place]) 05/02/2016  . Diabetes mellitus without complication ([place])   . Hypertension   . Mass of breast, right 04/01/2016  . Mass of upper lobe of right lung 04/01/2016   2 adjacent, 5 cm masses, hilar adenopathy  . Primary cancer of right upper lobe of lung ([place]) 04/01/2016   2 adjacent, 5 cm masses, hilar adenopathy  . Pulmonary embolus ([place]) 03/31/2016  . 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 COLONOSCOPY N/A 07/28/2017   Procedure: COLONOSCOPY;  Surgeon: Rogene Houston, MD;  Location: [facility] ENDO SUITE;  Service: Endoscopy;  Laterality: N/A;  205  .  PORTACATH PLACEMENT Left 05/13/2016   Procedure: INSERTION PORT-A-CATH;  Surgeon: Aviva Signs, MD;  Location: [facility] ORS;  Service: General;  Laterality: Left;    Social History:  Social History   Socioeconomic History  . Marital status: Divorced    Spouse name: Not on file  . Number of children: Not on file  . Years of education: Not on file  . Highest education level: Not on  file  Occupational History  . Not on file  Tobacco Use  . Smoking status: Former Smoker    Years: 61.00    Quit date: 04/11/2014    Years since quitting: 5.7  . Smokeless tobacco: Never Used  . Tobacco comment: patient does vape smoking x 2 years  Vaping Use  . Vaping Use: Every day  Substance and Sexual Activity  . Alcohol use: Not Currently    Comment: quit 10 years  . Drug use: No  . Sexual activity: Not on file  Other Topics Concern  . Not on file  Social History Narrative  . Not on file   Social Determinants of Health   Financial Resource Strain:   . Difficulty of Paying Living Expenses: Not on file  Food Insecurity:   . Worried About Charity fundraiser in the Last Year: Not on file  . Ran Out of Food in the Last Year: Not on file  Transportation Needs:   . Lack of Transportation (Medical): Not on file  . Lack of Transportation (Non-Medical): Not on file  Physical Activity:   . Days of Exercise per Week: Not on file  . Minutes of Exercise per Session: Not on file  Stress:   . Feeling of Stress : Not on file  Social Connections:   . Frequency of Communication with Friends and Family: Not on file  . Frequency of Social Gatherings with Friends and Family: Not on file  . Attends Religious Services: Not on file  . Active Member of Clubs or Organizations: Not on file  . Attends Archivist Meetings: Not on file  . Marital Status: Not on file  Intimate Partner Violence:   . Fear of Current or Ex-Partner: Not on file  . Emotionally Abused: Not on file  . Physically Abused: Not on file  . Sexually Abused: Not on file  The patient is divorced and lives in [place]. His daughter [name] helps with medical decision making.  Family History: Family History  Problem Relation Age of Onset  . Stroke Mother   . Heart attack Father   . Heart attack Brother    Medications: Current Outpatient Medications  Medication Sig Dispense Refill  . ALPRAZolam (XANAX) 1 MG  tablet One tab po 30 minutes prior to MRI or radiation 30 tablet 0  . amLODipine (NORVASC) 2.5 MG tablet Take 2.5 mg by mouth daily.  4  . CARBOPLATIN IV Inject into the vein every 21 ( twenty-one) days.    . DULoxetine (CYMBALTA) 60 MG capsule Take 60 mg by mouth daily.    . folic acid (FOLVITE) 1 MG tablet TAKE ONE TABLET BY MOUTH ONCE DAILY. START 5-7 DAYS BEFORE ALIMTA CHEMOTHERAPY AND CONTINUE FOR 21 DAYS AFTER CHEMOTHERAPY. 100 tablet 0  . furosemide (LASIX) 20 MG tablet Take 2 tablets (40 mg total) by mouth daily. 20 tablet 0  . gabapentin (NEURONTIN) 400 MG capsule Take 1 capsule (400 mg total) by mouth 3 (three) times daily. 90 capsule 3  . GLIPIZIDE XL 5 MG 24 hr tablet Take  5 mg by mouth daily.  4  . hydrocortisone 2.5 % lotion Apply topically as needed.     . lactulose (CHRONULAC) 10 GM/15ML solution Take 30 mLs (20 g total) by mouth at bedtime as needed for mild constipation. 480 mL 2  . levocetirizine (XYZAL) 5 MG tablet Take 5 mg by mouth at bedtime.    . magnesium oxide (MAG-OX) 400 (241.3 Mg) MG tablet Take 1 tablet (400 mg total) by mouth 2 (two) times daily. 60 tablet 2  . metFORMIN (GLUCOPHAGE) 500 MG tablet Take 500 mg by mouth 2 (two) times daily.  4  . metoprolol succinate (TOPROL-XL) 25 MG 24 hr tablet TAKE ONE TABLET BY MOUTH IN THE EVENING. 30 tablet 0  . oxyCODONE-acetaminophen (PERCOCET) 10-325 MG tablet Take 1 tablet by mouth as needed.   0  . Pembrolizumab (KEYTRUDA IV) Inject into the vein.    Marland Kitchen PEMEtrexed 500 mg/m2 in sodium chloride 0.9 % 100 mL Inject into the vein every 21 ( twenty-one) days.    . potassium chloride (K-DUR,KLOR-CON) 10 MEQ tablet Take 10 mEq by mouth daily.   0  . prochlorperazine (COMPAZINE) 10 MG tablet Take 1 tablet (10 mg total) by mouth every 6 (six) hours as needed (Nausea or vomiting). 30 tablet 1  . simvastatin (ZOCOR) 20 MG tablet Take 20 mg by mouth every evening.  5  . torsemide (DEMADEX) 20 MG tablet Take 1 tablet (20 mg total) by  mouth daily. 30 tablet 1  . lidocaine-prilocaine (EMLA) cream APPLY A QUARTER SIZE AMOUNT TO THE AFFECTED AREA 1 HOUR PRIOR TO COMING TO CHEMOTHERAPY. COVER WITH A PLASTIC WRAP. (Patient not taking: Reported on 12/09/2019) 30 g 0  . ondansetron (ZOFRAN) 4 MG tablet Take 1 tablet (4 mg total) by mouth every 8 (eight) hours as needed for nausea or vomiting. (Patient not taking: Reported on 12/09/2019) 4 tablet 0   No current facility-administered medications for this encounter.   Allergies: No Known Allergies  Physical Exam: Unable to assess due to encounter type.  Impression/Plan: 1. Progressive Metastatic Stage IV NSCLC, adenocarcinoma of the RUL with brain metastasis. Mr. Roots MRI was reviewed and discussed this morning in brain oncology conference. He is doing well radio graphically and we will move to 4 month intervals for MRIs now that he is more than a year out from his treatment. He will continue as well with Dr. Delton Coombes to receive systemic immunotherapy and follow up with CT imaging in the near future as well.  2. Claustraphobia. He will continue to premedicate with Ativan prior to MRI scans in the future.  3. Diarrhea, likely secondary to immunotherapy. I sent a message to Dr. Tomie China team and the patient was encouraged to increase otc imodium to two tablets TID prn, up to a total of 8 tabs/24 hrs.     Given current concerns for patient exposure during the COVID-19 pandemic, this encounter was conducted via telephone.  The patient has given verbal consent for this type of encounter. The time spent during this encounter was 25 minutes and 50% of that time was spent in the preparation, discussion, and coordination of his care. The attendants for this meeting include Shona Simpson, PAC and Deontae G Neis. During the encounter, Shona Simpson [facility] was located in the Radiation Oncology Department at [facility]. Lacharles G Qu was located at home.    Carola Rhine,  PAC

## 2019-12-29 ENCOUNTER — Other Ambulatory Visit: Payer: Self-pay

## 2019-12-29 ENCOUNTER — Ambulatory Visit (HOSPITAL_COMMUNITY)
Admission: RE | Admit: 2019-12-29 | Discharge: 2019-12-29 | Disposition: A | Discharge status: Home / Self Care | Payer: Medicare Other | Source: Ambulatory Visit | Attending: Hematology | Admitting: Hematology

## 2019-12-29 DIAGNOSIS — I2510 Atherosclerotic heart disease of native coronary artery without angina pectoris: Secondary | ICD-10-CM | POA: Diagnosis not present

## 2019-12-29 DIAGNOSIS — C3491 Malignant neoplasm of unspecified part of right bronchus or lung: Secondary | ICD-10-CM | POA: Diagnosis not present

## 2019-12-29 DIAGNOSIS — J8410 Pulmonary fibrosis, unspecified: Secondary | ICD-10-CM | POA: Diagnosis not present

## 2019-12-29 DIAGNOSIS — C3411 Malignant neoplasm of upper lobe, right bronchus or lung: Secondary | ICD-10-CM | POA: Diagnosis not present

## 2019-12-29 DIAGNOSIS — I700 Atherosclerosis of aorta: Secondary | ICD-10-CM | POA: Diagnosis not present

## 2019-12-29 MED ORDER — IOHEXOL 300 MG/ML  SOLN
75.0000 mL | Freq: Once | INTRAMUSCULAR | Status: AC | PRN
  Administered 2019-12-29: 75 mL via INTRAVENOUS

## 2019-12-30 ENCOUNTER — Inpatient Hospital Stay (HOSPITAL_COMMUNITY): Payer: Medicare Other | Admitting: Hematology

## 2019-12-30 ENCOUNTER — Inpatient Hospital Stay (HOSPITAL_COMMUNITY): Payer: Medicare Other

## 2019-12-30 ENCOUNTER — Encounter (HOSPITAL_COMMUNITY): Payer: Self-pay | Admitting: Hematology

## 2019-12-30 VITALS — BP 132/59 | HR 50 | Temp 97.5°F | Resp 18 | Wt 216.3 lb

## 2019-12-30 DIAGNOSIS — E860 Dehydration: Secondary | ICD-10-CM

## 2019-12-30 DIAGNOSIS — Z5112 Encounter for antineoplastic immunotherapy: Secondary | ICD-10-CM | POA: Diagnosis not present

## 2019-12-30 DIAGNOSIS — R197 Diarrhea, unspecified: Secondary | ICD-10-CM

## 2019-12-30 DIAGNOSIS — C3411 Malignant neoplasm of upper lobe, right bronchus or lung: Secondary | ICD-10-CM

## 2019-12-30 LAB — COMPREHENSIVE METABOLIC PANEL
ALT: 12 U/L (ref 0–44)
AST: 13 U/L — ABNORMAL LOW (ref 15–41)
Albumin: 4 g/dL (ref 3.5–5.0)
Alkaline Phosphatase: 61 U/L (ref 38–126)
Anion gap: 11 (ref 5–15)
BUN: 21 mg/dL (ref 8–23)
CO2: 24 mmol/L (ref 22–32)
Calcium: 8.6 mg/dL — ABNORMAL LOW (ref 8.9–10.3)
Chloride: 103 mmol/L (ref 98–111)
Creatinine, Ser: 1.8 mg/dL — ABNORMAL HIGH (ref 0.61–1.24)
GFR calc Af Amer: 42 mL/min — ABNORMAL LOW (ref >60)
GFR calc non Af Amer: 36 mL/min — ABNORMAL LOW (ref >60)
Glucose, Bld: 109 mg/dL — ABNORMAL HIGH (ref 70–99)
Potassium: 3.3 mmol/L — ABNORMAL LOW (ref 3.5–5.1)
Sodium: 138 mmol/L (ref 135–145)
Total Bilirubin: 0.6 mg/dL (ref 0.3–1.2)
Total Protein: 7.1 g/dL (ref 6.5–8.1)

## 2019-12-30 LAB — CBC WITH DIFFERENTIAL/PLATELET
Abs Immature Granulocytes: 0.02 10*3/uL (ref 0.00–0.07)
Basophils Absolute: 0 10*3/uL (ref 0.0–0.1)
Basophils Relative: 1 %
Eosinophils Absolute: 0.3 10*3/uL (ref 0.0–0.5)
Eosinophils Relative: 4 %
HCT: 43 % (ref 39.0–52.0)
Hemoglobin: 13.8 g/dL (ref 13.0–17.0)
Immature Granulocytes: 0 %
Lymphocytes Relative: 42 %
Lymphs Abs: 3.6 10*3/uL (ref 0.7–4.0)
MCH: 29.4 pg (ref 26.0–34.0)
MCHC: 32.1 g/dL (ref 30.0–36.0)
MCV: 91.7 fL (ref 80.0–100.0)
Monocytes Absolute: 0.9 10*3/uL (ref 0.1–1.0)
Monocytes Relative: 11 %
Neutro Abs: 3.6 10*3/uL (ref 1.7–7.7)
Neutrophils Relative %: 42 %
Platelets: 256 10*3/uL (ref 150–400)
RBC: 4.69 MIL/uL (ref 4.22–5.81)
RDW: 14.4 % (ref 11.5–15.5)
WBC: 8.5 10*3/uL (ref 4.0–10.5)
nRBC: 0 % (ref 0.0–0.2)

## 2019-12-30 MED ORDER — HEPARIN SOD (PORK) LOCK FLUSH 100 UNIT/ML IV SOLN
500.0000 [IU] | Freq: Once | INTRAVENOUS | Status: AC | PRN
  Administered 2019-12-30: 500 [IU]

## 2019-12-30 MED ORDER — SODIUM CHLORIDE 0.9 % IV SOLN
Freq: Once | INTRAVENOUS | Status: AC
  Filled 2019-12-30: qty 1000

## 2019-12-30 MED ORDER — PREDNISONE 20 MG PO TABS: 60.0000 mg | ORAL_TABLET | Freq: Every day | ORAL | 0 refills | Status: DC

## 2019-12-30 MED ORDER — SODIUM CHLORIDE 0.9% FLUSH
10.0000 mL | Freq: Once | INTRAVENOUS | Status: AC | PRN
  Administered 2019-12-30: 10 mL

## 2019-12-30 NOTE — Progress Notes (Signed)
[facility] [address] [place] [address]   CLINIC:  Medical Oncology/Hematology  PCP:  Celene Squibb, MD [address] [place] [place] [address] [phone]   REASON FOR VISIT:  Follow-up for stage IV adenocarcinoma of right lung  PRIOR THERAPY:  1. Keytruda from 05/17/2016 to 09/24/2018. 2. Carboplatin, pemetrexed x 4 cycles from 12/25/2018 to 03/03/2019.  NGS Results: PD-L1 50%  CURRENT THERAPY: Keytruda every 3 weeks  BRIEF ONCOLOGIC HISTORY:  Oncology History  Primary cancer of right upper lobe of lung ([facility])  05/14/2015 Procedure   Port placement by Dr. Arnoldo Morale   04/01/2016 Initial Diagnosis   Primary cancer of right upper lobe of lung ([facility])   04/02/2016 Procedure   Ultrasound-guided core biopsy performed of a solid 1.5 cm soft tissue nodule in the right chest wall.   04/03/2016 Imaging   MRI brain- No acute and intracranial process or metastasis.  Moderate chronic small vessel ischemic disease.   04/04/2016 Pathology Results   Diagnosis Soft Tissue Needle Core Biopsy, Right Chest Wall SMOOTH MUSCLE NEOPLASM Microscopic Comment The differential diagnosis include low grade leiomyosarcoma. The neoplasm shows spindle cell morphology with rare mitotic figures and minimal atypia, no necrosis present. These cells stains positive for smooth muscle actin, desmin, negative for cytokeratin AE1&3, ck8/18, s100, cd117, cd 34 and cd99. This case also reviewed by Dr. [name] and agree.   04/05/2016 Procedure   CT-guided biopsy of right upper lobe lesion, with tissue specimen sent to pathology, by IR.   04/09/2016 Pathology Results   Lung, needle/core biopsy(ies), Right Upper Lobe - NON SMALL CELL CARCINOMA.   04/17/2016 Pathology Results   PDL1 High Expression.  TPS 50%.   04/24/2016 PET scan   1. Prominently hypermetabolic large right upper lobe mass with metastatic disease to the right paratracheal and right hilar nodal chains, to the  descending mesocolon, to the left upper buttock subcutaneous tissues, to the left upper lobe, to the right subscapularis muscle, and possibly to the left adrenal gland and right breast subcutaneous tissues. Appearance compatible with stage IV lung cancer. 2. There is some focal high activity in the ascending colon which is probably from peristalsis, less likely from a local colon mass. This does raise the possibility that the adjacent mesocolon tumor implant could be due to synchronous colon cancer rather than lung metastasis. 3. Other imaging findings of potential clinical significance: Coronary, aortic arch, and branch vessel atherosclerotic vascular disease. Aortoiliac atherosclerotic vascular disease. Deformity left proximal humerus and left glenohumeral joint from prior trauma. Enlarged prostate gland.   04/26/2016 Pathology Results   FoundationONE: Genomic alterations identified- KRAS I34V, CREBBP splice site [id number]>[id number], LRP1B S515*, OVF64 P32*, RJJ88 splice site [id number], SPTA1 splice site [id number], TP53 C135Y.  Additional findings- MS-Stable, TMB-intermediate (14 Muts/Mb).  No reportable alterations- EGFR, ALK, BRAF, MET, ERBB2, RET, ROS1.   05/17/2016 -  Chemotherapy   The patient had ONDANSETRON IVPB [facility] +/- DEXAMETHASONE, , Intravenous,  Once, 0 of 1 cycle  pembrolizumab (KEYTRUDA) 200 mg in sodium chloride 0.9 % 50 mL chemo infusion, 200 mg, Intravenous, Once, 0 of 5 cycles  for chemotherapy treatment.     08/06/2016 PET scan   IMPRESSION: 1. Overall considerable improvement. The right upper lobe mass is markedly reduced in volume and SUV. Thoracic adenopathy have resolved and the hypermetabolic lesion in the right subscapularis muscle has also resolved. 2. The left upper lobe nodule is no longer appreciably hypermetabolic, and is highly indistinct although this  may be due to motion artifact. Faint in indistinct nodularity elsewhere in the lungs likewise not  currently hypermetabolic. 3. There is some very faint residual low-grade activity along the subcutaneous deposit along the upper left buttock region. Marked reduction in size and activity of the tumor deposit adjacent to the descending colon. 4. Prior right pleural effusion has resolved. 5. Stable appearance of the adrenal glands, with low-level activity and a nodule in the left adrenal gland. 6. Stable small nodule in the right breast, low-grade metabolic activity with maximum SUV 2.2 (formerly 3.0) 7. Other imaging findings of potential clinical significance: Severe arthropathy of the left glenohumeral joint. Coronary, aortic arch, and branch vessel atherosclerotic vascular disease. Aortoiliac atherosclerotic vascular disease. Prominent prostate gland indents the bladder base.    12/16/2018 -  Chemotherapy   The patient had palonosetron (ALOXI) injection 0.25 mg, 0.25 mg, Intravenous,  Once, 4 of 4 cycles Administration: 0.25 mg (12/16/2018), 0.25 mg (01/27/2019), 0.25 mg (03/03/2019), 0.25 mg (01/06/2019) pegfilgrastim (NEULASTA) injection 6 mg, 6 mg, Subcutaneous, Once, 2 of 2 cycles Administration: 6 mg (01/08/2019) pegfilgrastim (NEULASTA ONPRO KIT) injection 6 mg, 6 mg, Subcutaneous, Once, 2 of 2 cycles pegfilgrastim-cbqv (UDENYCA) injection 6 mg, 6 mg, Subcutaneous, Once, 2 of 2 cycles Administration: 6 mg (01/29/2019), 6 mg (03/05/2019) ondansetron (ZOFRAN) 8 mg in sodium chloride 0.9 % 50 mL IVPB, 8 mg (100 % of original dose 8 mg), Intravenous,  Once, 3 of 3 cycles Dose modification: 8 mg (original dose 8 mg, Cycle 5) Administration: 8 mg (03/24/2019), 8 mg (04/16/2019) PEMEtrexed (ALIMTA) 1,100 mg in sodium chloride 0.9 % 100 mL chemo infusion, 490 mg/m2 = 1,125 mg, Intravenous,  Once, 6 of 6 cycles Dose modification: 400 mg/m2 (80 % of original dose 500 mg/m2, Cycle 3, Reason: Other (see comments), Comment: rash, swelling) Administration: 1,100 mg (12/16/2018), 900 mg (01/27/2019),  900 mg (03/03/2019), 900 mg (03/24/2019), 900 mg (04/16/2019), 1,100 mg (01/06/2019) CARBOplatin (PARAPLATIN) 500 mg in sodium chloride 0.9 % 250 mL chemo infusion, 530 mg (110.7 % of original dose 477.5 mg), Intravenous,  Once, 4 of 4 cycles Dose modification:   (original dose 477.5 mg, Cycle 1),   (original dose 485.5 mg, Cycle 3),   (original dose 494 mg, Cycle 4),   (original dose 494 mg, Cycle 2) Administration: 500 mg (12/16/2018), 490 mg (01/27/2019), 490 mg (03/03/2019), 490 mg (01/06/2019) pembrolizumab (KEYTRUDA) 200 mg in sodium chloride 0.9 % 50 mL chemo infusion, 2 mg/kg = 200 mg, Intravenous, Once, 12 of 13 cycles Dose modification: 200 mg (original dose 2 mg/kg, Cycle 14, Reason: Provider Judgment) Administration: 200 mg (04/16/2019), 200 mg (05/31/2019), 200 mg (06/22/2019), 200 mg (07/13/2019), 200 mg (05/10/2019), 200 mg (08/03/2019), 200 mg (08/24/2019), 200 mg (09/14/2019), 200 mg (10/08/2019), 200 mg (10/29/2019), 200 mg (11/18/2019), 200 mg (12/09/2019) fosaprepitant (EMEND) 150 mg, dexamethasone (DECADRON) 12 mg in sodium chloride 0.9 % 145 mL IVPB, , Intravenous,  Once, 4 of 4 cycles Administration:  (12/16/2018),  (01/27/2019),  (03/03/2019),  (01/06/2019)  for chemotherapy treatment.    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  Two adjacent large masslike areas of consolidation within the right upper lobe with differential considerations including malignancy or pneumonia. Multiple enlarged right hilar and mediastinal lymph nodes which may be reactive or metastatic in etiology.  Additional indeterminate pulmonary nodules as above. Recommend attention on follow-up.  Indeterminate 2.9 cm left  adrenal nodule. This needs dedicated evaluation with pre and post contrast-enhanced CT or MRI after confirmation of pulmonary process.  Indeterminate nodule within the  right breast. Recommend dedicated evaluation with mammography.  Hepatic steatosis.   04/02/2016 Procedure   Ultrasound-guided core biopsy performed of a solid 1.5 cm soft tissue nodule in the right chest wall.   04/04/2016 Pathology Results   Diagnosis Soft Tissue Needle Core Biopsy, Right Chest Wall SMOOTH MUSCLE NEOPLASM Microscopic Comment The differential diagnosis include low grade leiomyosarcoma. The neoplasm shows spindle cell morphology with rare mitotic figures and minimal atypia, no necrosis present. These cells stains positive for smooth muscle actin, desmin, negative for cytokeratin AE1&3, ck8/18, s100, cd117, cd 34 and cd99. This case also reviewed by Dr. [name] and agree.      CANCER STAGING: Cancer Staging Primary cancer of right upper lobe of lung ([facility]) Staging form: Lung, AJCC 8th Edition - Clinical: Stage IVB (cT3, cN2, cM1c) - Signed by Baird Cancer, PA-C on 05/07/2016   INTERVAL HISTORY:  Darrell Christensen, a 74 y.o. male, returns for routine follow-up and consideration for next cycle of chemotherapy. Darrell Christensen was last seen on 12/09/2019.  Due for cycle #18 of pembrolizumab today.   Today he reports having diarrhea since Friday, 8/20, multiple times daily; he also had watery stools twice this morning. He takes Lomotil multiple times for the diarrhea. His appetite is not good, though he is drinking plenty of water daily. He denies having any abdominal pain, though he reports having intermittent aches in his chest.  Overall, he feels ready for next cycle of chemo today.    REVIEW OF SYSTEMS:  Review of Systems  Constitutional: Positive for appetite change (severely decreased) and fatigue (depleted).  Respiratory: Positive for cough (w/ diarrhea).   Cardiovascular: Positive for chest pain (intermittent). Negative for leg swelling.  Gastrointestinal: Positive for diarrhea (frequent).  All other systems reviewed and are negative.   PAST  MEDICAL/SURGICAL HISTORY:  Past Medical History:  Diagnosis Date  . Adrenal mass, left ([facility]) 04/01/2016  . Diabetes mellitus ([facility]) 05/02/2016  . Diabetes mellitus without complication ([facility])   . Hypertension   . Mass of breast, right 04/01/2016  . Mass of upper lobe of right lung 04/01/2016   2 adjacent, 5 cm masses, hilar adenopathy  . Primary cancer of right upper lobe of lung ([facility]) 04/01/2016   2 adjacent, 5 cm masses, hilar adenopathy  . Pulmonary embolus ([facility]) 03/31/2016  . 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 COLONOSCOPY N/A 07/28/2017   Procedure: COLONOSCOPY;  Surgeon: Rogene Houston, MD;  Location: [facility] ENDO SUITE;  Service: Endoscopy;  Laterality: N/A;  205  . PORTACATH PLACEMENT Left 05/13/2016   Procedure: INSERTION PORT-A-CATH;  Surgeon: Aviva Signs, MD;  Location: [facility] ORS;  Service: General;  Laterality: Left;    SOCIAL HISTORY:  Social History   Socioeconomic History  . Marital status: Divorced    Spouse name: Not on file  . Number of children: Not on file  . Years of education: Not on file  . Highest education level: Not on file  Occupational History  . Not on file  Tobacco Use  . Smoking status: Former Smoker    Years: 61.00    Quit date: 04/11/2014    Years since quitting: 5.7  . Smokeless tobacco: Never Used  . Tobacco comment: patient does vape smoking x 2 years  Vaping Use  . Vaping Use:  Every day  Substance and Sexual Activity  . Alcohol use: Not Currently    Comment: quit 10 years  . Drug use: No  . Sexual activity: Not on file  Other Topics Concern  . Not on file  Social History Narrative  . Not on file   Social Determinants of Health   Financial Resource Strain:   . Difficulty of Paying Living Expenses: Not on file  Food Insecurity:   . Worried About Charity fundraiser in the Last Year: Not on file  . Ran Out of Food in the Last Year: Not on file  Transportation Needs:   . Lack of  Transportation (Medical): Not on file  . Lack of Transportation (Non-Medical): Not on file  Physical Activity:   . Days of Exercise per Week: Not on file  . Minutes of Exercise per Session: Not on file  Stress:   . Feeling of Stress : Not on file  Social Connections:   . Frequency of Communication with Friends and Family: Not on file  . Frequency of Social Gatherings with Friends and Family: Not on file  . Attends Religious Services: Not on file  . Active Member of Clubs or Organizations: Not on file  . Attends Archivist Meetings: Not on file  . Marital Status: Not on file  Intimate Partner Violence:   . Fear of Current or Ex-Partner: Not on file  . Emotionally Abused: Not on file  . Physically Abused: Not on file  . Sexually Abused: Not on file    FAMILY HISTORY:  Family History  Problem Relation Age of Onset  . Stroke Mother   . Heart attack Father   . Heart attack Brother     CURRENT MEDICATIONS:  Current Outpatient Medications  Medication Sig Dispense Refill  . ALPRAZolam (XANAX) 1 MG tablet One tab po 30 minutes prior to MRI or radiation 30 tablet 0  . amLODipine (NORVASC) 2.5 MG tablet Take 2.5 mg by mouth daily.  4  . CARBOPLATIN IV Inject into the vein every 21 ( twenty-one) days.    . DULoxetine (CYMBALTA) 60 MG capsule Take 60 mg by mouth daily.    . folic acid (FOLVITE) 1 MG tablet TAKE ONE TABLET BY MOUTH ONCE DAILY. START 5-7 DAYS BEFORE ALIMTA CHEMOTHERAPY AND CONTINUE FOR 21 DAYS AFTER CHEMOTHERAPY. 100 tablet 0  . furosemide (LASIX) 20 MG tablet Take 2 tablets (40 mg total) by mouth daily. 20 tablet 0  . gabapentin (NEURONTIN) 400 MG capsule Take 1 capsule (400 mg total) by mouth 3 (three) times daily. 90 capsule 3  . GLIPIZIDE XL 5 MG 24 hr tablet Take 5 mg by mouth daily.  4  . hydrocortisone 2.5 % lotion Apply topically as needed.     . lactulose (CHRONULAC) 10 GM/15ML solution Take 30 mLs (20 g total) by mouth at bedtime as needed for mild  constipation. 480 mL 2  . levocetirizine (XYZAL) 5 MG tablet Take 5 mg by mouth at bedtime.    . lidocaine-prilocaine (EMLA) cream APPLY A QUARTER SIZE AMOUNT TO THE AFFECTED AREA 1 HOUR PRIOR TO COMING TO CHEMOTHERAPY. COVER WITH A PLASTIC WRAP. 30 g 0  . magnesium oxide (MAG-OX) 400 (241.3 Mg) MG tablet Take 1 tablet (400 mg total) by mouth 2 (two) times daily. 60 tablet 2  . metFORMIN (GLUCOPHAGE) 500 MG tablet Take 500 mg by mouth 2 (two) times daily.  4  . metoprolol succinate (TOPROL-XL) 25 MG 24 hr tablet  TAKE ONE TABLET BY MOUTH IN THE EVENING. 30 tablet 0  . ondansetron (ZOFRAN) 4 MG tablet Take 1 tablet (4 mg total) by mouth every 8 (eight) hours as needed for nausea or vomiting. 4 tablet 0  . oxyCODONE-acetaminophen (PERCOCET) 10-325 MG tablet Take 1 tablet by mouth as needed.   0  . Pembrolizumab (KEYTRUDA IV) Inject into the vein.    Marland Kitchen PEMEtrexed 500 mg/m2 in sodium chloride 0.9 % 100 mL Inject into the vein every 21 ( twenty-one) days.    . potassium chloride (K-DUR,KLOR-CON) 10 MEQ tablet Take 10 mEq by mouth daily.   0  . prochlorperazine (COMPAZINE) 10 MG tablet Take 1 tablet (10 mg total) by mouth every 6 (six) hours as needed (Nausea or vomiting). 30 tablet 1  . simvastatin (ZOCOR) 20 MG tablet Take 20 mg by mouth every evening.  5  . torsemide (DEMADEX) 20 MG tablet Take 1 tablet (20 mg total) by mouth daily. 30 tablet 1   No current facility-administered medications for this visit.    ALLERGIES:  No Known Allergies  PHYSICAL EXAM:  Performance status (ECOG): 1 - Symptomatic but completely ambulatory  Vitals:   12/30/19 1020  BP: (!) 132/59  Pulse: (!) 50  Resp: 18  Temp: (!) 97.5 F (36.4 C)  SpO2: 98%   Wt Readings from Last 3 Encounters:  12/30/19 216 lb 4.3 oz (98.1 kg)  11/18/19 217 lb 6.4 oz (98.6 kg)  10/29/19 219 lb 3.2 oz (99.4 kg)   Physical Exam Vitals reviewed.  Constitutional:      Appearance: Normal appearance.  Cardiovascular:     Rate and  Rhythm: Normal rate and regular rhythm.     Pulses: Normal pulses.     Heart sounds: Normal heart sounds.  Pulmonary:     Effort: Pulmonary effort is normal.     Breath sounds: Normal breath sounds.  Chest:     Comments: Port-a-Cath in L chest Abdominal:     Palpations: Abdomen is soft.     Tenderness: There is no abdominal tenderness.  Musculoskeletal:     Right lower leg: No edema.     Left lower leg: No edema.  Neurological:     General: No focal deficit present.     Mental Status: He is alert and oriented to person, place, and time.  Psychiatric:        Mood and Affect: Mood normal.        Behavior: Behavior normal.     LABORATORY DATA:  I have reviewed the labs as listed.  CBC Latest Ref Rng & Units 12/30/2019 12/09/2019 11/18/2019  WBC 4.0 - 10.5 K/uL 8.5 10.3 11.7(H)  Hemoglobin 13.0 - 17.0 g/dL 13.8 14.0 13.7  Hematocrit 39 - 52 % 43.0 43.2 43.4  Platelets 150 - 400 K/uL 256 243 235   CMP Latest Ref Rng & Units 12/30/2019 12/09/2019 11/18/2019  Glucose 70 - 99 mg/dL 109(H) 114(H) 241(H)  BUN 8 - 23 mg/dL '21 20 22  ' Creatinine 0.61 - 1.24 mg/dL 1.80(H) 1.39(H) 1.65(H)  Sodium 135 - 145 mmol/L 138 139 140  Potassium 3.5 - 5.1 mmol/L 3.3(L) 3.9 4.0  Chloride 98 - 111 mmol/L 103 102 104  CO2 22 - 32 mmol/L '24 25 25  ' Calcium 8.9 - 10.3 mg/dL 8.6(L) 8.9 9.0  Total Protein 6.5 - 8.1 g/dL 7.1 7.0 7.0  Total Bilirubin 0.3 - 1.2 mg/dL 0.6 0.5 0.4  Alkaline Phos 38 - 126 U/L 61 74 82  AST 15 - 41 U/L 13(L) 16 16  ALT 0 - 44 U/L '12 15 15    ' DIAGNOSTIC IMAGING:  I have independently reviewed the scans and discussed with the patient. CT Chest W Contrast  Result Date: 12/29/2019 CLINICAL DATA:  Right lung cancer, status post chemotherapy EXAM: CT CHEST WITH CONTRAST TECHNIQUE: Multidetector CT imaging of the chest was performed during intravenous contrast administration. CONTRAST:  93m OMNIPAQUE IOHEXOL 300 MG/ML  SOLN COMPARISON:  09/30/2019 FINDINGS: Cardiovascular: The heart is  top-normal in size. No pericardial effusion. No evidence of thoracic aortic aneurysm. Atherosclerotic calcifications of the aortic arch. Three vessel coronary atherosclerosis. Left chest port terminates in the proximal SVC. Mediastinum/Nodes: Small mediastinal lymph nodes, including a dominant 8 mm short axis subcarinal node, within normal limits. 12 mm short axis right hilar node (series 2/image 65), previously 11 mm, grossly unchanged. Visualized thyroid is notable for a 14 mm right thyroid nodule, unchanged. Not clinically significant; no follow-up imaging recommended (ref: J Am Coll Radiol. 2015 Feb;12(2): 143-50). Lungs/Pleura: 2.1 x 4.0 cm posterior right upper lobe mass (series 4/image 54), previously 2.3 x 3.8 cm, grossly unchanged. Additional 6 mm nodule in the anterior right upper lobe (series 4/image 60), 4 x 9 mm nodule in the left upper lobe (series 4/image 9), and 7 mm irregular nodule in the right middle lobe (series 4/image 84), grossly unchanged. Subpleural reticulation/fibrosis in the lungs bilaterally, lower lobe predominant, favoring mild to moderate chronic interstitial lung disease. No focal consolidation. No pleural effusion or pneumothorax. Upper Abdomen: 2.8 x 1.6 cm left adrenal nodule (series 2/image 139), unchanged when measured in a similar fashion. Hepatic steatosis with focal fatty sparing. Vascular calcifications. Musculoskeletal: Stable 15 mm inferior right breast nodule (series 2/image 89). Degenerative changes of the visualized thoracolumbar spine. IMPRESSION: 4.0 cm posterior right upper lobe mass, corresponding to the patient's known primary bronchogenic neoplasm, grossly unchanged. Additional stable subcentimeter bilateral pulmonary nodules. Stable 12 mm right hilar node. Additional small mediastinal nodes, within normal limits. Stable 2.8 cm left adrenal nodule. Additional stable ancillary findings as above. Aortic Atherosclerosis (ICD10-I70.0). Electronically Signed   By:  SJulian HyM.D.   On: 12/29/2019 14:58   MR Brain W Wo Contrast  Result Date: 12/23/2019 CLINICAL DATA:  Brain metastasis. Follow-up of treatment to brain; brain/CNS neoplasm, surveillance. Additional history obtained from [facility], prior SRS to right parietal lobe lesion July 2020. EXAM: MRI HEAD WITHOUT AND WITH CONTRAST TECHNIQUE: Multiplanar, multiecho pulse sequences of the brain and surrounding structures were obtained without and with intravenous contrast. CONTRAST:  147mGADAVIST GADOBUTROL 1 MMOL/ML IV SOLN COMPARISON:  Prior brain MRI examinations 09/16/2019 and earlier. FINDINGS: Brain: Multiple sequences are significantly motion degraded, limiting evaluation. The axial SWI sequence and axial T1 weighted postcontrast sequence are most notably affected. Stable, moderate generalized parenchymal atrophy. Unchanged size of a 6 mm enhancing lesion within the right parietal lobe (series 9, image 73) (remeasured on prior). A subtle interval increase in extent of surrounding T2 hyperintensity is questioned. Within the limitations of motion degradation, no new intracranial metastasis is identified. Stable background moderate multifocal T2/FLAIR hyperintensity within the cerebral white matter and pons which is nonspecific, but consistent with chronic small vessel ischemic disease. There is no acute infarct. No extra-axial fluid collection. No midline shift. Vascular: Expected proximal arterial flow voids. Skull and upper cervical spine: No focal marrow lesion Sinuses/Orbits: Visualized orbits show no acute finding. Mild paranasal sinus mucosal thickening. Small right maxillary sinus mucous  retention cyst. No significant mastoid effusion. IMPRESSION: Motion degraded examination as described. This includes significant motion degradation of the axial T1 weighted postcontrast sequence. Unchanged size of a 6 mm treated enhancing lesion within the right parietal lobe. A  subtle interval increase in surrounding T2 hyperintensity is questioned, although this may simply reflect interval progression of treatment related changes. Attention recommended on follow-up. Within the limitations of motion degradation, no new intracranial metastasis is identified. Stable generalized parenchymal atrophy and chronic small vessel ischemic disease. Electronically Signed   By: Kellie Simmering DO   On: 12/23/2019 20:53     ASSESSMENT:  1. Stage IV adenocarcinoma lung, PD-L1 50%, other actionable mutations negative: --Keytruda from 05/17/2016 through 09/24/2018 with mild progression. -4 cycles of carboplatin, pemetrexed from 12/25/2018 through 03/03/2019. -Pemetrexed maintenance from 03/24/2019 through 04/12/2019 discontinued secondary to renal insufficiency. -CT chest with contrast from 09/30/2019 shows right upper lobe lung lesion measuring 3.8 x 2.3)(4.2 x 2.6 cm). No change in the left upper lobe nodule. Subcarinal lymph node measures 11 mm, previously 7 mm. No definite left hilar adenopathy. Stable left adrenal nodule. -CT chest on 12/29/2019 shows 4 cm posterior right upper lobe mass grossly unchanged.  Stable 12 mm right hilar node.  Stable 2.8 cm left adrenal nodule.  2. Brain metastasis: -SRS to the right parietal lobe lesion with surrounding edema on 11/24/2018. -MRI of the brain on 09/16/2019 shows stable treated right parietal metastasis. No new mass or abnormal enhancement. -MRI of the brain on 12/23/2019 shows unchanged 6 mm treated enhancing lesion in the right parietal lobe.  No new lesions.   PLAN:  1. Stage IV adenocarcinoma lung, PD-L1 50%, other actionable mutations negative: -Reported diarrhea which is watery since 12/24/2019. -Occasionally had episodes of  5 to 10/day. -We will hold immunotherapy today.  I have discussed the results of the CT scan with the patient in detail. -We will start him on prednisone 60 mg daily.  Told to take Imodium as needed. -Reviewed  his labs.  LFTs are normal.  Creatinine has increased to 1.8. -We will give him IV hydration today.  We will reevaluate him in 1 week.  2. Brain metastasis: -Reviewed results of MRI from 12/23/2019.  3. Itching rash: -Continue moisturizing lotion to lower extremities.  4. Peripheral neuropathy: -Continue gabapentin 400 mg 3 times a day and duloxetine 30 mg daily.  5. Hypomagnesemia: -Continue magnesium twice daily.   Orders placed this encounter:  No orders of the defined types were placed in this encounter.    Derek Jack, MD [place] [phone]   I, Milinda Antis, am acting as a scribe for Dr. Sanda Linger.  I, Derek Jack MD, have reviewed the above documentation for accuracy and completeness, and I agree with the above.

## 2019-12-30 NOTE — Progress Notes (Signed)
Patient was assessed by Dr. Delton Coombes and labs reviewed.  Patient has been having multiple episodes of diarrhea x 6 days.  We are not giving treatment today.  We will start prednisone 60 mg daily and have patient return in 1 week to assess his bowels at that time.  Patient is to receive fluids today with 20 potassium and 2 gram magnesium.  Orders for stool cultures, cdiff, ova and parasite placed.  Primary RN and pharmacy aware.

## 2019-12-30 NOTE — Progress Notes (Signed)
Patient given supplies to collect stool at home with understanding verbalized.

## 2019-12-30 NOTE — Patient Instructions (Signed)
[place] at [facility] Discharge Instructions  You were seen today by Dr. Delton Coombes. He went over your recent results and scans. You did not receive treatment today, instead only fluids. You will be prescribed prednisone 60 mg to take every morning to help with your diarrhea. Drink plenty of water daily. Dr. Delton Coombes will see you back in 1 week for labs and follow up.   Thank you for choosing [place] at [facility] to provide your oncology and hematology care.  To afford each patient quality time with our provider, please arrive at least 15 minutes before your scheduled appointment time.   If you have a lab appointment with the [facility] please come in thru the Main Entrance and check in at the main information desk  You need to re-schedule your appointment should you arrive 10 or more minutes late.  We strive to give you quality time with our providers, and arriving late affects you and other patients whose appointments are after yours.  Also, if you no show three or more times for appointments you may be dismissed from the clinic at the providers discretion.     Again, thank you for choosing [facility].  Our hope is that these requests will decrease the amount of time that you wait before being seen by our physicians.       _____________________________________________________________  Should you have questions after your visit to [facility], please contact our office at [phone] between the hours of 8:00 a.m. and 4:30 p.m.  Voicemails left after 4:00 p.m. will not be returned until the following business day.  For prescription refill requests, have your pharmacy contact our office and allow 72 hours.    Cancer Center Support Programs:   > Cancer Support Group  2nd Tuesday of the month 1pm-2pm, Journey Room

## 2020-01-03 ENCOUNTER — Other Ambulatory Visit (HOSPITAL_COMMUNITY): Payer: Self-pay

## 2020-01-03 ENCOUNTER — Other Ambulatory Visit (HOSPITAL_COMMUNITY): Payer: Self-pay | Admitting: *Deleted

## 2020-01-03 DIAGNOSIS — C3411 Malignant neoplasm of upper lobe, right bronchus or lung: Secondary | ICD-10-CM

## 2020-01-03 DIAGNOSIS — Z5112 Encounter for antineoplastic immunotherapy: Secondary | ICD-10-CM | POA: Diagnosis not present

## 2020-01-03 LAB — C DIFFICILE QUICK SCREEN W PCR REFLEX
C Diff antigen: NEGATIVE
C Diff interpretation: NOT DETECTED
C Diff toxin: NEGATIVE

## 2020-01-06 ENCOUNTER — Inpatient Hospital Stay (HOSPITAL_COMMUNITY): Payer: Medicare Other | Attending: Hematology | Admitting: Hematology

## 2020-01-06 ENCOUNTER — Inpatient Hospital Stay (HOSPITAL_COMMUNITY): Payer: Medicare Other

## 2020-01-06 ENCOUNTER — Other Ambulatory Visit: Payer: Self-pay

## 2020-01-06 ENCOUNTER — Other Ambulatory Visit (HOSPITAL_COMMUNITY): Payer: Self-pay | Admitting: *Deleted

## 2020-01-06 VITALS — BP 156/63 | HR 55 | Temp 97.2°F | Resp 20 | Wt 224.8 lb

## 2020-01-06 DIAGNOSIS — C3411 Malignant neoplasm of upper lobe, right bronchus or lung: Secondary | ICD-10-CM | POA: Diagnosis not present

## 2020-01-06 DIAGNOSIS — Z79899 Other long term (current) drug therapy: Secondary | ICD-10-CM | POA: Insufficient documentation

## 2020-01-06 DIAGNOSIS — E860 Dehydration: Secondary | ICD-10-CM

## 2020-01-06 DIAGNOSIS — Z5112 Encounter for antineoplastic immunotherapy: Secondary | ICD-10-CM | POA: Diagnosis not present

## 2020-01-06 DIAGNOSIS — C7931 Secondary malignant neoplasm of brain: Secondary | ICD-10-CM | POA: Insufficient documentation

## 2020-01-06 LAB — CBC WITH DIFFERENTIAL/PLATELET
Abs Immature Granulocytes: 0.21 10*3/uL — ABNORMAL HIGH (ref 0.00–0.07)
Basophils Absolute: 0.1 10*3/uL (ref 0.0–0.1)
Basophils Relative: 0 %
Eosinophils Absolute: 0.2 10*3/uL (ref 0.0–0.5)
Eosinophils Relative: 1 %
HCT: 42.8 % (ref 39.0–52.0)
Hemoglobin: 13.6 g/dL (ref 13.0–17.0)
Immature Granulocytes: 1 %
Lymphocytes Relative: 35 %
Lymphs Abs: 5.2 10*3/uL — ABNORMAL HIGH (ref 0.7–4.0)
MCH: 29.2 pg (ref 26.0–34.0)
MCHC: 31.8 g/dL (ref 30.0–36.0)
MCV: 92 fL (ref 80.0–100.0)
Monocytes Absolute: 1 10*3/uL (ref 0.1–1.0)
Monocytes Relative: 7 %
Neutro Abs: 8.4 10*3/uL — ABNORMAL HIGH (ref 1.7–7.7)
Neutrophils Relative %: 56 %
Platelets: 260 10*3/uL (ref 150–400)
RBC: 4.65 MIL/uL (ref 4.22–5.81)
RDW: 15.3 % (ref 11.5–15.5)
WBC: 15 10*3/uL — ABNORMAL HIGH (ref 4.0–10.5)
nRBC: 0.1 % (ref 0.0–0.2)

## 2020-01-06 LAB — COMPREHENSIVE METABOLIC PANEL
ALT: 17 U/L (ref 0–44)
AST: 13 U/L — ABNORMAL LOW (ref 15–41)
Albumin: 3.5 g/dL (ref 3.5–5.0)
Alkaline Phosphatase: 57 U/L (ref 38–126)
Anion gap: 11 (ref 5–15)
BUN: 25 mg/dL — ABNORMAL HIGH (ref 8–23)
CO2: 25 mmol/L (ref 22–32)
Calcium: 8.3 mg/dL — ABNORMAL LOW (ref 8.9–10.3)
Chloride: 104 mmol/L (ref 98–111)
Creatinine, Ser: 1.58 mg/dL — ABNORMAL HIGH (ref 0.61–1.24)
GFR calc Af Amer: 49 mL/min — ABNORMAL LOW (ref >60)
GFR calc non Af Amer: 42 mL/min — ABNORMAL LOW (ref >60)
Glucose, Bld: 112 mg/dL — ABNORMAL HIGH (ref 70–99)
Potassium: 3.2 mmol/L — ABNORMAL LOW (ref 3.5–5.1)
Sodium: 140 mmol/L (ref 135–145)
Total Bilirubin: 0.7 mg/dL (ref 0.3–1.2)
Total Protein: 6.4 g/dL — ABNORMAL LOW (ref 6.5–8.1)

## 2020-01-06 LAB — MAGNESIUM: Magnesium: 2.2 mg/dL (ref 1.7–2.4)

## 2020-01-06 LAB — TSH: TSH: 1.463 u[IU]/mL (ref 0.350–4.500)

## 2020-01-06 MED ORDER — PREDNISONE 20 MG PO TABS
80.0000 mg | ORAL_TABLET | Freq: Every day | ORAL | 0 refills | Status: DC
Start: 2020-01-06 — End: 2020-01-20

## 2020-01-06 MED ORDER — HEPARIN SOD (PORK) LOCK FLUSH 100 UNIT/ML IV SOLN
500.0000 [IU] | Freq: Once | INTRAVENOUS | Status: AC
  Administered 2020-01-06: 500 [IU] via INTRAVENOUS

## 2020-01-06 MED ORDER — DIPHENOXYLATE-ATROPINE 2.5-0.025 MG PO TABS: ORAL_TABLET | ORAL | 2 refills | Status: DC

## 2020-01-06 MED ORDER — SODIUM CHLORIDE 0.9 % IV SOLN
Freq: Once | INTRAVENOUS | Status: AC
  Filled 2020-01-06: qty 1000

## 2020-01-06 NOTE — Progress Notes (Signed)
Patient tolerated hydration with no complaints voiced.  Port site clean and dry with good blood return noted before and after hydration.  No bruising or swelling noted with port.  Band aid applied.  VSS with discharge and left ambulatory with no s/s of distress noted.

## 2020-01-06 NOTE — Progress Notes (Signed)
[facility] [address] [place] [address]   CLINIC:  Medical Oncology/Hematology  PCP:  Celene Squibb, MD [address] [place] [address] [phone]   REASON FOR VISIT:  Follow-up for stage IV adenocarcinoma of right lung  PRIOR THERAPY:  1. Keytruda from 05/17/2016 to 09/24/2018. 2. Carboplatin, pemetrexed x 4 cycles from 12/25/2018 to 03/03/2019.  NGS Results: PD-L1 50%  CURRENT THERAPY: Keytruda every 3 weeks  BRIEF ONCOLOGIC HISTORY:  Oncology History  Primary cancer of right upper lobe of lung ([place])  05/14/2015 Procedure   Port placement by Dr. Arnoldo Morale   04/01/2016 Initial Diagnosis   Primary cancer of right upper lobe of lung ([place])   04/02/2016 Procedure   Ultrasound-guided core biopsy performed of a solid 1.5 cm soft tissue nodule in the right chest wall.   04/03/2016 Imaging   MRI brain- No acute and intracranial process or metastasis.  Moderate chronic small vessel ischemic disease.   04/04/2016 Pathology Results   Diagnosis Soft Tissue Needle Core Biopsy, Right Chest Wall SMOOTH MUSCLE NEOPLASM Microscopic Comment The differential diagnosis include low grade leiomyosarcoma. The neoplasm shows spindle cell morphology with rare mitotic figures and minimal atypia, no necrosis present. These cells stains positive for smooth muscle actin, desmin, negative for cytokeratin AE1&3, ck8/18, s100, cd117, cd 34 and cd99. This case also reviewed by Dr. [name] and agree.   04/05/2016 Procedure   CT-guided biopsy of right upper lobe lesion, with tissue specimen sent to pathology, by IR.   04/09/2016 Pathology Results   Lung, needle/core biopsy(ies), Right Upper Lobe - NON SMALL CELL CARCINOMA.   04/17/2016 Pathology Results   PDL1 High Expression.  TPS 50%.   04/24/2016 PET scan   1. Prominently hypermetabolic large right upper lobe mass with metastatic disease to the right paratracheal and right hilar nodal chains, to the  descending mesocolon, to the left upper buttock subcutaneous tissues, to the left upper lobe, to the right subscapularis muscle, and possibly to the left adrenal gland and right breast subcutaneous tissues. Appearance compatible with stage IV lung cancer. 2. There is some focal high activity in the ascending colon which is probably from peristalsis, less likely from a local colon mass. This does raise the possibility that the adjacent mesocolon tumor implant could be due to synchronous colon cancer rather than lung metastasis. 3. Other imaging findings of potential clinical significance: Coronary, aortic arch, and branch vessel atherosclerotic vascular disease. Aortoiliac atherosclerotic vascular disease. Deformity left proximal humerus and left glenohumeral joint from prior trauma. Enlarged prostate gland.   04/26/2016 Pathology Results   FoundationONE: Genomic alterations identified- KRAS F00F, CREBBP splice site [id number], LRP1B S515*, ITG54 D82*, MEB58 splice site [id number], SPTA1 splice site [id number], TP53 C135Y.  Additional findings- MS-Stable, TMB-intermediate (14 Muts/Mb).  No reportable alterations- EGFR, ALK, BRAF, MET, ERBB2, RET, ROS1.   05/17/2016 -  Chemotherapy   The patient had ONDANSETRON IVPB [facility] +/- DEXAMETHASONE, , Intravenous,  Once, 0 of 1 cycle  pembrolizumab (KEYTRUDA) 200 mg in sodium chloride 0.9 % 50 mL chemo infusion, 200 mg, Intravenous, Once, 0 of 5 cycles  for chemotherapy treatment.     08/06/2016 PET scan   IMPRESSION: 1. Overall considerable improvement. The right upper lobe mass is markedly reduced in volume and SUV. Thoracic adenopathy have resolved and the hypermetabolic lesion in the right subscapularis muscle has also resolved. 2. The left upper lobe nodule is no longer appreciably hypermetabolic, and is highly indistinct although this  may be due to motion artifact. Faint in indistinct nodularity elsewhere in the lungs likewise not  currently hypermetabolic. 3. There is some very faint residual low-grade activity along the subcutaneous deposit along the upper left buttock region. Marked reduction in size and activity of the tumor deposit adjacent to the descending colon. 4. Prior right pleural effusion has resolved. 5. Stable appearance of the adrenal glands, with low-level activity and a nodule in the left adrenal gland. 6. Stable small nodule in the right breast, low-grade metabolic activity with maximum SUV 2.2 (formerly 3.0) 7. Other imaging findings of potential clinical significance: Severe arthropathy of the left glenohumeral joint. Coronary, aortic arch, and branch vessel atherosclerotic vascular disease. Aortoiliac atherosclerotic vascular disease. Prominent prostate gland indents the bladder base.    12/16/2018 -  Chemotherapy   The patient had palonosetron (ALOXI) injection 0.25 mg, 0.25 mg, Intravenous,  Once, 4 of 4 cycles Administration: 0.25 mg (12/16/2018), 0.25 mg (01/27/2019), 0.25 mg (03/03/2019), 0.25 mg (01/06/2019) pegfilgrastim (NEULASTA) injection 6 mg, 6 mg, Subcutaneous, Once, 2 of 2 cycles Administration: 6 mg (01/08/2019) pegfilgrastim (NEULASTA ONPRO KIT) injection 6 mg, 6 mg, Subcutaneous, Once, 2 of 2 cycles pegfilgrastim-cbqv (UDENYCA) injection 6 mg, 6 mg, Subcutaneous, Once, 2 of 2 cycles Administration: 6 mg (01/29/2019), 6 mg (03/05/2019) ondansetron (ZOFRAN) 8 mg in sodium chloride 0.9 % 50 mL IVPB, 8 mg (100 % of original dose 8 mg), Intravenous,  Once, 3 of 3 cycles Dose modification: 8 mg (original dose 8 mg, Cycle 5) Administration: 8 mg (03/24/2019), 8 mg (04/16/2019) PEMEtrexed (ALIMTA) 1,100 mg in sodium chloride 0.9 % 100 mL chemo infusion, 490 mg/m2 = 1,125 mg, Intravenous,  Once, 6 of 6 cycles Dose modification: 400 mg/m2 (80 % of original dose 500 mg/m2, Cycle 3, Reason: Other (see comments), Comment: rash, swelling) Administration: 1,100 mg (12/16/2018), 900 mg (01/27/2019),  900 mg (03/03/2019), 900 mg (03/24/2019), 900 mg (04/16/2019), 1,100 mg (01/06/2019) CARBOplatin (PARAPLATIN) 500 mg in sodium chloride 0.9 % 250 mL chemo infusion, 530 mg (110.7 % of original dose 477.5 mg), Intravenous,  Once, 4 of 4 cycles Dose modification:   (original dose 477.5 mg, Cycle 1),   (original dose 485.5 mg, Cycle 3),   (original dose 494 mg, Cycle 4),   (original dose 494 mg, Cycle 2) Administration: 500 mg (12/16/2018), 490 mg (01/27/2019), 490 mg (03/03/2019), 490 mg (01/06/2019) pembrolizumab (KEYTRUDA) 200 mg in sodium chloride 0.9 % 50 mL chemo infusion, 2 mg/kg = 200 mg, Intravenous, Once, 12 of 13 cycles Dose modification: 200 mg (original dose 2 mg/kg, Cycle 14, Reason: Provider Judgment) Administration: 200 mg (04/16/2019), 200 mg (05/31/2019), 200 mg (06/22/2019), 200 mg (07/13/2019), 200 mg (05/10/2019), 200 mg (08/03/2019), 200 mg (08/24/2019), 200 mg (09/14/2019), 200 mg (10/08/2019), 200 mg (10/29/2019), 200 mg (11/18/2019), 200 mg (12/09/2019) fosaprepitant (EMEND) 150 mg, dexamethasone (DECADRON) 12 mg in sodium chloride 0.9 % 145 mL IVPB, , Intravenous,  Once, 4 of 4 cycles Administration:  (12/16/2018),  (01/27/2019),  (03/03/2019),  (01/06/2019)  for chemotherapy treatment.    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  Two adjacent large masslike areas of consolidation within the right upper lobe with differential considerations including malignancy or pneumonia. Multiple enlarged right hilar and mediastinal lymph nodes which may be reactive or metastatic in etiology.  Additional indeterminate pulmonary nodules as above. Recommend attention on follow-up.  Indeterminate 2.9 cm left  adrenal nodule. This needs dedicated evaluation with pre and post contrast-enhanced CT or MRI after confirmation of pulmonary process.  Indeterminate nodule within the  right breast. Recommend dedicated evaluation with mammography.  Hepatic steatosis.   04/02/2016 Procedure   Ultrasound-guided core biopsy performed of a solid 1.5 cm soft tissue nodule in the right chest wall.   04/04/2016 Pathology Results   Diagnosis Soft Tissue Needle Core Biopsy, Right Chest Wall SMOOTH MUSCLE NEOPLASM Microscopic Comment The differential diagnosis include low grade leiomyosarcoma. The neoplasm shows spindle cell morphology with rare mitotic figures and minimal atypia, no necrosis present. These cells stains positive for smooth muscle actin, desmin, negative for cytokeratin AE1&3, ck8/18, s100, cd117, cd 34 and cd99. This case also reviewed by Dr. [name] and agree.      CANCER STAGING: Cancer Staging Primary cancer of right upper lobe of lung ([place]) Staging form: Lung, AJCC 8th Edition - Clinical: Stage IVB (cT3, cN2, cM1c) - Signed by Baird Cancer, PA-C on 05/07/2016   INTERVAL HISTORY:  Mr. CALYB MCQUARRIE, a 74 y.o. male, returns for routine follow-up of his stage IV adenocarcinoma of right lung. Jony was last seen on 12/30/2019.  Today he continues complaining of 6-9 episodes of loose stool diarrhea daily. He continues taking prednisone 60 mg daily. His sleep is good until he has to run to the bathroom. He took Imodium but it did not help. He denies feeling lightheaded though every time he drinks water it causes him to have watery stools. He has not taken Demadex since he started having diarrhea.   REVIEW OF SYSTEMS:  Review of Systems  Constitutional: Positive for appetite change (depleted) and fatigue (depleted).  Respiratory: Positive for shortness of breath.   Cardiovascular: Positive for chest pain (3/10 chest pain).  Gastrointestinal: Positive for diarrhea (daily x2 weeks).  Neurological: Positive for numbness (feet, legs & hands). Negative for light-headedness.  All other systems reviewed and are negative.   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[place])    Past Surgical History:  Procedure Laterality Date   COLONOSCOPY N/A 07/28/2017   Procedure: COLONOSCOPY;  Surgeon: Rogene Houston, MD;  Location: [facility];  Service: Endoscopy;  Laterality: N/A;  [facility] Left 05/13/2016   Procedure: INSERTION PORT-A-CATH;  Surgeon: Aviva Signs, MD;  Location: [facility] ORS;  Service: General;  Laterality: Left;    SOCIAL HISTORY:  Social History   Socioeconomic History   Marital status: Divorced    Spouse name: Not on file   Number of children: Not on file   Years of education: Not on file   Highest education level: Not on file  Occupational History   Not on file  Tobacco Use   Smoking status: Former Smoker    Years: 61.00    Quit date: 04/11/2014    Years since quitting: 5.7   Smokeless tobacco: Never Used   Tobacco comment: patient does vape smoking x 2 years  Vaping Use  Vaping Use: Every day  Substance and Sexual Activity   Alcohol use: Not Currently    Comment: quit 10 years   Drug use: No   Sexual activity: Not on file  Other Topics Concern   Not on file  Social History Narrative   Not on file   Social Determinants of Health   Financial Resource Strain:    Difficulty of Paying Living Expenses: Not on file  Food Insecurity:    Worried About [place] in the Last Year: Not on file   Ran Out of Food in the Last Year: Not on file  Transportation Needs:    Lack of Transportation (Medical):  Not on file   Lack of Transportation (Non-Medical): Not on file  Physical Activity:    Days of Exercise per Week: Not on file   Minutes of Exercise per Session: Not on file  Stress:    Feeling of Stress : Not on file  Social Connections:    Frequency of Communication with Friends and Family: Not on file   Frequency of Social Gatherings with Friends and Family: Not on file   Attends Religious Services: Not on file   Active Member of Clubs or Organizations: Not on file   Attends Archivist Meetings: Not on file   Marital Status: Not on file  Intimate Partner Violence:    Fear of Current or Ex-Partner: Not on file   Emotionally Abused: Not on file   Physically Abused: Not on file   Sexually Abused: Not on file    FAMILY HISTORY:  Family History  Problem Relation Age of Onset   Stroke Mother    Heart attack Father    Heart attack Brother     CURRENT MEDICATIONS:  Current Outpatient Medications  Medication Sig Dispense Refill   amLODipine (NORVASC) 2.5 MG tablet Take 2.5 mg by mouth daily.  4   CARBOPLATIN IV Inject into the vein every 21 ( twenty-one) days.     DULoxetine (CYMBALTA) 60 MG capsule Take 60 mg by mouth daily.     folic acid (FOLVITE) 1 MG tablet TAKE ONE TABLET BY MOUTH ONCE DAILY. START 5-7 DAYS BEFORE ALIMTA CHEMOTHERAPY AND CONTINUE FOR 21 DAYS AFTER CHEMOTHERAPY. 100 tablet 0   furosemide (LASIX) 20 MG tablet Take 2 tablets (40 mg total) by mouth daily. 20 tablet 0   gabapentin (NEURONTIN) 400 MG capsule Take 1 capsule (400 mg total) by mouth 3 (three) times daily. 90 capsule 3   GLIPIZIDE XL 5 MG 24 hr tablet Take 5 mg by mouth daily.  4   hydrocortisone 2.5 % lotion Apply topically as needed.      lactulose (CHRONULAC) 10 GM/15ML solution Take 30 mLs (20 g total) by mouth at bedtime as needed for mild constipation. 480 mL 2   levocetirizine (XYZAL) 5 MG tablet Take 5 mg by mouth at bedtime.     lidocaine-prilocaine (EMLA)  cream APPLY A QUARTER SIZE AMOUNT TO THE AFFECTED AREA 1 HOUR PRIOR TO COMING TO CHEMOTHERAPY. COVER WITH A PLASTIC WRAP. 30 g 0   magnesium oxide (MAG-OX) 400 (241.3 Mg) MG tablet Take 1 tablet (400 mg total) by mouth 2 (two) times daily. 60 tablet 2   metFORMIN (GLUCOPHAGE) 500 MG tablet Take 500 mg by mouth 2 (two) times daily.  4   metoprolol succinate (TOPROL-XL) 25 MG 24 hr tablet TAKE ONE TABLET BY MOUTH IN THE EVENING. 30 tablet 0   ondansetron (ZOFRAN) 4 MG tablet  Take 1 tablet (4 mg total) by mouth every 8 (eight) hours as needed for nausea or vomiting. 4 tablet 0   oxyCODONE-acetaminophen (PERCOCET) 10-325 MG tablet Take 1 tablet by mouth as needed.   0   Pembrolizumab (KEYTRUDA IV) Inject into the vein.     PEMEtrexed 500 mg/m2 in sodium chloride 0.9 % 100 mL Inject into the vein every 21 ( twenty-one) days.     potassium chloride (K-DUR,KLOR-CON) 10 MEQ tablet Take 10 mEq by mouth daily.   0   predniSONE (DELTASONE) 20 MG tablet Take 3 tablets (60 mg total) by mouth daily. 60 tablet 0   prochlorperazine (COMPAZINE) 10 MG tablet Take 1 tablet (10 mg total) by mouth every 6 (six) hours as needed (Nausea or vomiting). 30 tablet 1   simvastatin (ZOCOR) 20 MG tablet Take 20 mg by mouth every evening.  5   torsemide (DEMADEX) 20 MG tablet Take 1 tablet (20 mg total) by mouth daily. 30 tablet 1   ALPRAZolam (XANAX) 1 MG tablet One tab po 30 minutes prior to MRI or radiation (Patient not taking: Reported on 01/06/2020) 30 tablet 0   No current facility-administered medications for this visit.    ALLERGIES:  No Known Allergies  PHYSICAL EXAM:  Performance status (ECOG): 1 - Symptomatic but completely ambulatory  Vitals:   01/06/20 1013  BP: (!) 156/63  Pulse: (!) 55  Resp: 20  Temp: (!) 97.2 F (36.2 C)  SpO2: 96%   Wt Readings from Last 3 Encounters:  01/06/20 224 lb 12.8 oz (102 kg)  12/30/19 216 lb 4.3 oz (98.1 kg)  11/18/19 217 lb 6.4 oz (98.6 kg)   Physical  Exam Vitals reviewed.  Constitutional:      Appearance: Normal appearance. He is obese.  Cardiovascular:     Rate and Rhythm: Normal rate and regular rhythm.     Pulses: Normal pulses.     Heart sounds: Normal heart sounds.  Pulmonary:     Effort: Pulmonary effort is normal.     Breath sounds: Normal breath sounds.  Chest:     Comments: Port-a-Cath in L chest Musculoskeletal:     Right lower leg: Edema (1+) present.     Left lower leg: Edema (1+) present.  Neurological:     General: No focal deficit present.     Mental Status: He is alert and oriented to person, place, and time.  Psychiatric:        Mood and Affect: Mood normal.        Behavior: Behavior normal.      LABORATORY DATA:  I have reviewed the labs as listed.  CBC Latest Ref Rng & Units 01/06/2020 12/30/2019 12/09/2019  WBC 4.0 - 10.5 K/uL 15.0(H) 8.5 10.3  Hemoglobin 13.0 - 17.0 g/dL 13.6 13.8 14.0  Hematocrit 39 - 52 % 42.8 43.0 43.2  Platelets 150 - 400 K/uL 260 256 243   CMP Latest Ref Rng & Units 01/06/2020 12/30/2019 12/09/2019  Glucose 70 - 99 mg/dL 112(H) 109(H) 114(H)  BUN 8 - 23 mg/dL 25(H) 21 20  Creatinine 0.61 - 1.24 mg/dL 1.58(H) 1.80(H) 1.39(H)  Sodium 135 - 145 mmol/L 140 138 139  Potassium 3.5 - 5.1 mmol/L 3.2(L) 3.3(L) 3.9  Chloride 98 - 111 mmol/L 104 103 102  CO2 22 - 32 mmol/L '25 24 25  ' Calcium 8.9 - 10.3 mg/dL 8.3(L) 8.6(L) 8.9  Total Protein 6.5 - 8.1 g/dL 6.4(L) 7.1 7.0  Total Bilirubin 0.3 - 1.2 mg/dL 0.7  0.6 0.5  Alkaline Phos 38 - 126 U/L 57 61 74  AST 15 - 41 U/L 13(L) 13(L) 16  ALT 0 - 44 U/L '17 12 15    ' DIAGNOSTIC IMAGING:  I have independently reviewed the scans and discussed with the patient. CT Chest W Contrast  Result Date: 12/29/2019 CLINICAL DATA:  Right lung cancer, status post chemotherapy EXAM: CT CHEST WITH CONTRAST TECHNIQUE: Multidetector CT imaging of the chest was performed during intravenous contrast administration. CONTRAST:  19m OMNIPAQUE IOHEXOL 300 MG/ML  SOLN  COMPARISON:  09/30/2019 FINDINGS: Cardiovascular: The heart is top-normal in size. No pericardial effusion. No evidence of thoracic aortic aneurysm. Atherosclerotic calcifications of the aortic arch. Three vessel coronary atherosclerosis. Left chest port terminates in the proximal SVC. Mediastinum/Nodes: Small mediastinal lymph nodes, including a dominant 8 mm short axis subcarinal node, within normal limits. 12 mm short axis right hilar node (series 2/image 65), previously 11 mm, grossly unchanged. Visualized thyroid is notable for a 14 mm right thyroid nodule, unchanged. Not clinically significant; no follow-up imaging recommended (ref: J Am Coll Radiol. 2015 Feb;12(2): 143-50). Lungs/Pleura: 2.1 x 4.0 cm posterior right upper lobe mass (series 4/image 54), previously 2.3 x 3.8 cm, grossly unchanged. Additional 6 mm nodule in the anterior right upper lobe (series 4/image 60), 4 x 9 mm nodule in the left upper lobe (series 4/image 9), and 7 mm irregular nodule in the right middle lobe (series 4/image 84), grossly unchanged. Subpleural reticulation/fibrosis in the lungs bilaterally, lower lobe predominant, favoring mild to moderate chronic interstitial lung disease. No focal consolidation. No pleural effusion or pneumothorax. Upper Abdomen: 2.8 x 1.6 cm left adrenal nodule (series 2/image 139), unchanged when measured in a similar fashion. Hepatic steatosis with focal fatty sparing. Vascular calcifications. Musculoskeletal: Stable 15 mm inferior right breast nodule (series 2/image 89). Degenerative changes of the visualized thoracolumbar spine. IMPRESSION: 4.0 cm posterior right upper lobe mass, corresponding to the patient's known primary bronchogenic neoplasm, grossly unchanged. Additional stable subcentimeter bilateral pulmonary nodules. Stable 12 mm right hilar node. Additional small mediastinal nodes, within normal limits. Stable 2.8 cm left adrenal nodule. Additional stable ancillary findings as above. Aortic  Atherosclerosis (ICD10-I70.0). Electronically Signed   By: SJulian HyM.D.   On: 12/29/2019 14:58   MR Brain W Wo Contrast  Result Date: 12/23/2019 CLINICAL DATA:  Brain metastasis. Follow-up of treatment to brain; brain/CNS neoplasm, surveillance. Additional history obtained from [facility], prior SRS to right parietal lobe lesion July 2020. EXAM: MRI HEAD WITHOUT AND WITH CONTRAST TECHNIQUE: Multiplanar, multiecho pulse sequences of the brain and surrounding structures were obtained without and with intravenous contrast. CONTRAST:  130mGADAVIST GADOBUTROL 1 MMOL/ML IV SOLN COMPARISON:  Prior brain MRI examinations 09/16/2019 and earlier. FINDINGS: Brain: Multiple sequences are significantly motion degraded, limiting evaluation. The axial SWI sequence and axial T1 weighted postcontrast sequence are most notably affected. Stable, moderate generalized parenchymal atrophy. Unchanged size of a 6 mm enhancing lesion within the right parietal lobe (series 9, image 73) (remeasured on prior). A subtle interval increase in extent of surrounding T2 hyperintensity is questioned. Within the limitations of motion degradation, no new intracranial metastasis is identified. Stable background moderate multifocal T2/FLAIR hyperintensity within the cerebral white matter and pons which is nonspecific, but consistent with chronic small vessel ischemic disease. There is no acute infarct. No extra-axial fluid collection. No midline shift. Vascular: Expected proximal arterial flow voids. Skull and upper cervical spine: No focal marrow lesion Sinuses/Orbits: Visualized orbits show  no acute finding. Mild paranasal sinus mucosal thickening. Small right maxillary sinus mucous retention cyst. No significant mastoid effusion. IMPRESSION: Motion degraded examination as described. This includes significant motion degradation of the axial T1 weighted postcontrast sequence. Unchanged size of a 6 mm  treated enhancing lesion within the right parietal lobe. A subtle interval increase in surrounding T2 hyperintensity is questioned, although this may simply reflect interval progression of treatment related changes. Attention recommended on follow-up. Within the limitations of motion degradation, no new intracranial metastasis is identified. Stable generalized parenchymal atrophy and chronic small vessel ischemic disease. Electronically Signed   By: Kellie Simmering DO   On: 12/23/2019 20:53     ASSESSMENT:  1. Stage IV adenocarcinoma lung, PD-L1 50%, other actionable mutations negative: --Keytruda from 05/17/2016 through 09/24/2018 with mild progression. -4 cycles of carboplatin, pemetrexed from 12/25/2018 through 03/03/2019. -Pemetrexed maintenance from 03/24/2019 through 04/12/2019 discontinued secondary to renal insufficiency. -CT chest with contrast from 09/30/2019 shows right upper lobe lung lesion measuring 3.8 x 2.3)(4.2 x 2.6 cm). No change in the left upper lobe nodule. Subcarinal lymph node measures 11 mm, previously 7 mm. No definite left hilar adenopathy. Stable left adrenal nodule. -CT chest on 12/29/2019 shows 4 cm posterior right upper lobe mass grossly unchanged.  Stable 12 mm right hilar node.  Stable 2.8 cm left adrenal nodule.  2. Brain metastasis: -SRS to the right parietal lobe lesion with surrounding edema on 11/24/2018. -MRI of the brain on 09/16/2019 shows stable treated right parietal metastasis. No new mass or abnormal enhancement. -MRI of the brain on 12/23/2019 shows unchanged 6 mm treated enhancing lesion in the right parietal lobe.  No new lesions.   PLAN:  1. Stage IV adenocarcinoma lung, PD-L1 50%, other actionable mutations negative: -Treatment was held and prednisone 60 mg daily started on 12/30/2019 for diarrhea. -Reviewed his labs today.  Creatinine is 1.58.  LFTs are normal.  Total bilirubin is normal.  White count is elevated secondary to prednisone at 15.   Hemoglobin and platelets are normal. -We will continue to hold his treatment at this time.  2. Brain metastasis: -Reviewed MRI from 12/23/2019.  3. Itching rash: -Continue moisturizing lotion.  4. Peripheral neuropathy: -Continue gabapentin and duloxetine.  5. Hypomagnesemia: -Continue magnesium twice daily.  Magnesium today is 2.2.  6.  Immunotherapy related colitis: -He has developed diarrhea about 5-10 stools per day, we started him on prednisone 60 mg on 12/30/2019. -Since then his diarrhea improved but he is still having about 4-5 watery stools per day. -I will increase his prednisone to 80 mg daily.  I have suggested him to take Lomotil as prescribed. -I plan to reevaluate him in 1 week.  If there is no improvement, will consider infliximab.   Orders placed this encounter:  No orders of the defined types were placed in this encounter.    Derek Jack, MD [place] [phone]   I, Milinda Antis, am acting as a scribe for Dr. Sanda Linger.  I, Derek Jack MD, have reviewed the above documentation for accuracy and completeness, and I agree with the above.

## 2020-01-06 NOTE — Progress Notes (Signed)
Patient here today for follow up.  He is continuing to have diarrhea.  We have called him in some lomotil and advised to take 2 tablets at first watery stool and then 1 tablet after each watery stool.  Dr. Delton Coombes has increased his prednisone to 80 mg daily.  He also wants to give patient normal saline with potassium 20 mEq and magnesium 2 grams today.  Patient will return next week for assessment.  Treatment nurses and pharmacy aware.

## 2020-01-06 NOTE — Patient Instructions (Signed)
[place] at [facility] Discharge Instructions  You were seen today by Dr. Delton Coombes. He went over your recent results. You received fluids today. You will be prescribed Lomotil for your diarrhea; take 2 tablets when you have your first episode of diarrhea, and then take 1 tablet after every watery bowel movement. Start taking 4 tablets of prednisone daily. Dr. Delton Coombes will see you back in 1 week for labs and follow up.   Thank you for choosing [place] at [facility] to provide your oncology and hematology care.  To afford each patient quality time with our provider, please arrive at least 15 minutes before your scheduled appointment time.   If you have a lab appointment with the [facility] please come in thru the Main Entrance and check in at the main information desk  You need to re-schedule your appointment should you arrive 10 or more minutes late.  We strive to give you quality time with our providers, and arriving late affects you and other patients whose appointments are after yours.  Also, if you no show three or more times for appointments you may be dismissed from the clinic at the providers discretion.     Again, thank you for choosing [facility].  Our hope is that these requests will decrease the amount of time that you wait before being seen by our physicians.       _____________________________________________________________  Should you have questions after your visit to [facility], please contact our office at [phone] between the hours of 8:00 a.m. and 4:30 p.m.  Voicemails left after 4:00 p.m. will not be returned until the following business day.  For prescription refill requests, have your pharmacy contact our office and allow 72 hours.    Cancer Center Support Programs:   > Cancer Support Group  2nd Tuesday of the month 1pm-2pm, Journey Room

## 2020-01-07 ENCOUNTER — Inpatient Hospital Stay (HOSPITAL_COMMUNITY): Payer: Medicare Other

## 2020-01-07 LAB — STOOL CULTURE REFLEX - RSASHR

## 2020-01-07 LAB — STOOL CULTURE REFLEX - CMPCXR

## 2020-01-07 LAB — STOOL CULTURE: E coli, Shiga toxin Assay: NEGATIVE

## 2020-01-11 LAB — O&P RESULT

## 2020-01-11 LAB — OVA + PARASITE EXAM

## 2020-01-13 ENCOUNTER — Inpatient Hospital Stay (HOSPITAL_COMMUNITY): Payer: Medicare Other

## 2020-01-13 ENCOUNTER — Encounter (HOSPITAL_COMMUNITY): Payer: Self-pay | Admitting: Hematology

## 2020-01-13 ENCOUNTER — Inpatient Hospital Stay (HOSPITAL_COMMUNITY): Payer: Medicare Other | Admitting: Hematology

## 2020-01-13 ENCOUNTER — Other Ambulatory Visit: Payer: Self-pay

## 2020-01-13 VITALS — BP 153/89 | HR 67 | Temp 97.8°F | Resp 20 | Wt 219.0 lb

## 2020-01-13 DIAGNOSIS — Z5112 Encounter for antineoplastic immunotherapy: Secondary | ICD-10-CM | POA: Diagnosis not present

## 2020-01-13 DIAGNOSIS — C7931 Secondary malignant neoplasm of brain: Secondary | ICD-10-CM | POA: Diagnosis not present

## 2020-01-13 DIAGNOSIS — C3411 Malignant neoplasm of upper lobe, right bronchus or lung: Secondary | ICD-10-CM

## 2020-01-13 DIAGNOSIS — R197 Diarrhea, unspecified: Secondary | ICD-10-CM

## 2020-01-13 DIAGNOSIS — Z79899 Other long term (current) drug therapy: Secondary | ICD-10-CM | POA: Diagnosis not present

## 2020-01-13 LAB — CBC WITH DIFFERENTIAL/PLATELET
Abs Immature Granulocytes: 0.13 10*3/uL — ABNORMAL HIGH (ref 0.00–0.07)
Basophils Absolute: 0 10*3/uL (ref 0.0–0.1)
Basophils Relative: 0 %
Eosinophils Absolute: 0 10*3/uL (ref 0.0–0.5)
Eosinophils Relative: 0 %
HCT: 45.9 % (ref 39.0–52.0)
Hemoglobin: 14.5 g/dL (ref 13.0–17.0)
Immature Granulocytes: 1 %
Lymphocytes Relative: 8 %
Lymphs Abs: 1.3 10*3/uL (ref 0.7–4.0)
MCH: 29.8 pg (ref 26.0–34.0)
MCHC: 31.6 g/dL (ref 30.0–36.0)
MCV: 94.4 fL (ref 80.0–100.0)
Monocytes Absolute: 0.5 10*3/uL (ref 0.1–1.0)
Monocytes Relative: 3 %
Neutro Abs: 14.5 10*3/uL — ABNORMAL HIGH (ref 1.7–7.7)
Neutrophils Relative %: 88 %
Platelets: 223 10*3/uL (ref 150–400)
RBC: 4.86 MIL/uL (ref 4.22–5.81)
RDW: 15.8 % — ABNORMAL HIGH (ref 11.5–15.5)
WBC: 16.4 10*3/uL — ABNORMAL HIGH (ref 4.0–10.5)
nRBC: 0 % (ref 0.0–0.2)

## 2020-01-13 LAB — COMPREHENSIVE METABOLIC PANEL
ALT: 37 U/L (ref 0–44)
AST: 20 U/L (ref 15–41)
Albumin: 3.8 g/dL (ref 3.5–5.0)
Alkaline Phosphatase: 57 U/L (ref 38–126)
Anion gap: 12 (ref 5–15)
BUN: 42 mg/dL — ABNORMAL HIGH (ref 8–23)
CO2: 29 mmol/L (ref 22–32)
Calcium: 9 mg/dL (ref 8.9–10.3)
Chloride: 100 mmol/L (ref 98–111)
Creatinine, Ser: 1.9 mg/dL — ABNORMAL HIGH (ref 0.61–1.24)
GFR calc Af Amer: 39 mL/min — ABNORMAL LOW (ref >60)
GFR calc non Af Amer: 34 mL/min — ABNORMAL LOW (ref >60)
Glucose, Bld: 165 mg/dL — ABNORMAL HIGH (ref 70–99)
Potassium: 4.1 mmol/L (ref 3.5–5.1)
Sodium: 141 mmol/L (ref 135–145)
Total Bilirubin: 0.7 mg/dL (ref 0.3–1.2)
Total Protein: 6.3 g/dL — ABNORMAL LOW (ref 6.5–8.1)

## 2020-01-13 LAB — MAGNESIUM: Magnesium: 2.5 mg/dL — ABNORMAL HIGH (ref 1.7–2.4)

## 2020-01-13 MED ORDER — SODIUM CHLORIDE 0.9% FLUSH
10.0000 mL | Freq: Once | INTRAVENOUS | Status: AC
  Administered 2020-01-13: 10 mL via INTRAVENOUS

## 2020-01-13 MED ORDER — SODIUM CHLORIDE 0.9 % IV SOLN: INTRAVENOUS | Status: AC

## 2020-01-13 MED ORDER — HEPARIN SOD (PORK) LOCK FLUSH 100 UNIT/ML IV SOLN
500.0000 [IU] | Freq: Once | INTRAVENOUS | Status: AC
  Administered 2020-01-13: 500 [IU] via INTRAVENOUS

## 2020-01-13 MED ORDER — FAMOTIDINE 20 MG PO TABS: 20.0000 mg | ORAL_TABLET | Freq: Every day | ORAL | 2 refills | Status: AC

## 2020-01-13 NOTE — Progress Notes (Signed)
Patient was assessed by Dr. Delton Coombes and labs have been reviewed.  Patient's diarrhea has improved, we will decrease his prednisone to 40 mg daily.  We are adding pepcid to his regimen to help aide his stomach. Patient is okay to proceed with fluids today. Primary RN and pharmacy aware.

## 2020-01-13 NOTE — Progress Notes (Signed)
Darrell Christensen presents today for follow up visit with Dr. Delton Coombes as well as IV fluids. Pt reports that his diarrhea has begun to improve since last visit. Lab results and vitals have been reviewed and are stable. Patient has been assessed by Dr. Delton Coombes who has ordered one liter on NS to be given over 2 hours.  Hydration tolerated without incident or complaint. Port flushed and deaccessed per protocol, see MAR and IV flowsheet for details. Discharged in satisfactory condition with follow up instructions.

## 2020-01-13 NOTE — Progress Notes (Signed)
[facility] [address], [place] [address]   CLINIC:  Medical Oncology/Hematology  PCP:  Celene Squibb, MD [address] [place] [address] [phone]   REASON FOR VISIT:  Follow-up for stage IV adenocarcinoma of right lung  PRIOR THERAPY:  1. Keytruda from 05/17/2016 to 09/24/2018. 2. Carboplatin, pemetrexed x 4 cycles from 12/25/2018 to 03/03/2019.  NGS Results: PD-L1 50%  CURRENT THERAPY: Keytruda every 3 weeks  BRIEF ONCOLOGIC HISTORY:  Oncology History  Primary cancer of right upper lobe of lung ([place])  05/14/2015 Procedure   Port placement by Dr. Arnoldo Morale   04/01/2016 Initial Diagnosis   Primary cancer of right upper lobe of lung ([place])   04/02/2016 Procedure   Ultrasound-guided core biopsy performed of a solid 1.5 cm soft tissue nodule in the right chest wall.   04/03/2016 Imaging   MRI brain- No acute and intracranial process or metastasis.  Moderate chronic small vessel ischemic disease.   04/04/2016 Pathology Results   Diagnosis Soft Tissue Needle Core Biopsy, Right Chest Wall SMOOTH MUSCLE NEOPLASM Microscopic Comment The differential diagnosis include low grade leiomyosarcoma. The neoplasm shows spindle cell morphology with rare mitotic figures and minimal atypia, no necrosis present. These cells stains positive for smooth muscle actin, desmin, negative for cytokeratin AE1&3, ck8/18, s100, cd117, cd 34 and cd99. This case also reviewed by Dr. [name] and agree.   04/05/2016 Procedure   CT-guided biopsy of right upper lobe lesion, with tissue specimen sent to pathology, by IR.   04/09/2016 Pathology Results   Lung, needle/core biopsy(ies), Right Upper Lobe - NON SMALL CELL CARCINOMA.   04/17/2016 Pathology Results   PDL1 High Expression.  TPS 50%.   04/24/2016 PET scan   1. Prominently hypermetabolic large right upper lobe mass with metastatic disease to the right paratracheal and right hilar nodal chains, to the  descending mesocolon, to the left upper buttock subcutaneous tissues, to the left upper lobe, to the right subscapularis muscle, and possibly to the left adrenal gland and right breast subcutaneous tissues. Appearance compatible with stage IV lung cancer. 2. There is some focal high activity in the ascending colon which is probably from peristalsis, less likely from a local colon mass. This does raise the possibility that the adjacent mesocolon tumor implant could be due to synchronous colon cancer rather than lung metastasis. 3. Other imaging findings of potential clinical significance: Coronary, aortic arch, and branch vessel atherosclerotic vascular disease. Aortoiliac atherosclerotic vascular disease. Deformity left proximal humerus and left glenohumeral joint from prior trauma. Enlarged prostate gland.   04/26/2016 Pathology Results   FoundationONE: Genomic alterations identified- KRAS J03E, CREBBP splice site [id number], LRP1B S515*, TMA26 J33*, LKT62 splice site [id number], SPTA1 splice site [id number], TP53 C135Y.  Additional findings- MS-Stable, TMB-intermediate (14 Muts/Mb).  No reportable alterations- EGFR, ALK, BRAF, MET, ERBB2, RET, ROS1.   05/17/2016 -  Chemotherapy   The patient had ONDANSETRON IVPB [facility] +/- DEXAMETHASONE, , Intravenous,  Once, 0 of 1 cycle  pembrolizumab (KEYTRUDA) 200 mg in sodium chloride 0.9 % 50 mL chemo infusion, 200 mg, Intravenous, Once, 0 of 5 cycles  for chemotherapy treatment.     08/06/2016 PET scan   IMPRESSION: 1. Overall considerable improvement. The right upper lobe mass is markedly reduced in volume and SUV. Thoracic adenopathy have resolved and the hypermetabolic lesion in the right subscapularis muscle has also resolved. 2. The left upper lobe nodule is no longer appreciably hypermetabolic, and is highly indistinct although this  may be due to motion artifact. Faint in indistinct nodularity elsewhere in the lungs likewise not  currently hypermetabolic. 3. There is some very faint residual low-grade activity along the subcutaneous deposit along the upper left buttock region. Marked reduction in size and activity of the tumor deposit adjacent to the descending colon. 4. Prior right pleural effusion has resolved. 5. Stable appearance of the adrenal glands, with low-level activity and a nodule in the left adrenal gland. 6. Stable small nodule in the right breast, low-grade metabolic activity with maximum SUV 2.2 (formerly 3.0) 7. Other imaging findings of potential clinical significance: Severe arthropathy of the left glenohumeral joint. Coronary, aortic arch, and branch vessel atherosclerotic vascular disease. Aortoiliac atherosclerotic vascular disease. Prominent prostate gland indents the bladder base.    12/16/2018 -  Chemotherapy   The patient had palonosetron (ALOXI) injection 0.25 mg, 0.25 mg, Intravenous,  Once, 4 of 4 cycles Administration: 0.25 mg (12/16/2018), 0.25 mg (01/27/2019), 0.25 mg (03/03/2019), 0.25 mg (01/06/2019) pegfilgrastim (NEULASTA) injection 6 mg, 6 mg, Subcutaneous, Once, 2 of 2 cycles Administration: 6 mg (01/08/2019) pegfilgrastim (NEULASTA ONPRO KIT) injection 6 mg, 6 mg, Subcutaneous, Once, 2 of 2 cycles pegfilgrastim-cbqv (UDENYCA) injection 6 mg, 6 mg, Subcutaneous, Once, 2 of 2 cycles Administration: 6 mg (01/29/2019), 6 mg (03/05/2019) ondansetron (ZOFRAN) 8 mg in sodium chloride 0.9 % 50 mL IVPB, 8 mg (100 % of original dose 8 mg), Intravenous,  Once, 3 of 3 cycles Dose modification: 8 mg (original dose 8 mg, Cycle 5) Administration: 8 mg (03/24/2019), 8 mg (04/16/2019) PEMEtrexed (ALIMTA) 1,100 mg in sodium chloride 0.9 % 100 mL chemo infusion, 490 mg/m2 = 1,125 mg, Intravenous,  Once, 6 of 6 cycles Dose modification: 400 mg/m2 (80 % of original dose 500 mg/m2, Cycle 3, Reason: Other (see comments), Comment: rash, swelling) Administration: 1,100 mg (12/16/2018), 900 mg (01/27/2019),  900 mg (03/03/2019), 900 mg (03/24/2019), 900 mg (04/16/2019), 1,100 mg (01/06/2019) CARBOplatin (PARAPLATIN) 500 mg in sodium chloride 0.9 % 250 mL chemo infusion, 530 mg (110.7 % of original dose 477.5 mg), Intravenous,  Once, 4 of 4 cycles Dose modification:   (original dose 477.5 mg, Cycle 1),   (original dose 485.5 mg, Cycle 3),   (original dose 494 mg, Cycle 4),   (original dose 494 mg, Cycle 2) Administration: 500 mg (12/16/2018), 490 mg (01/27/2019), 490 mg (03/03/2019), 490 mg (01/06/2019) pembrolizumab (KEYTRUDA) 200 mg in sodium chloride 0.9 % 50 mL chemo infusion, 2 mg/kg = 200 mg, Intravenous, Once, 12 of 13 cycles Dose modification: 200 mg (original dose 2 mg/kg, Cycle 14, Reason: Provider Judgment) Administration: 200 mg (04/16/2019), 200 mg (05/31/2019), 200 mg (06/22/2019), 200 mg (07/13/2019), 200 mg (05/10/2019), 200 mg (08/03/2019), 200 mg (08/24/2019), 200 mg (09/14/2019), 200 mg (10/08/2019), 200 mg (10/29/2019), 200 mg (11/18/2019), 200 mg (12/09/2019) fosaprepitant (EMEND) 150 mg, dexamethasone (DECADRON) 12 mg in sodium chloride 0.9 % 145 mL IVPB, , Intravenous,  Once, 4 of 4 cycles Administration:  (12/16/2018),  (01/27/2019),  (03/03/2019),  (01/06/2019)  for chemotherapy treatment.    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  Two adjacent large masslike areas of consolidation within the right upper lobe with differential considerations including malignancy or pneumonia. Multiple enlarged right hilar and mediastinal lymph nodes which may be reactive or metastatic in etiology.  Additional indeterminate pulmonary nodules as above. Recommend attention on follow-up.  Indeterminate 2.9 cm left  adrenal nodule. This needs dedicated evaluation with pre and post contrast-enhanced CT or MRI after confirmation of pulmonary process.  Indeterminate nodule within the  right breast. Recommend dedicated evaluation with mammography.  Hepatic steatosis.   04/02/2016 Procedure   Ultrasound-guided core biopsy performed of a solid 1.5 cm soft tissue nodule in the right chest wall.   04/04/2016 Pathology Results   Diagnosis Soft Tissue Needle Core Biopsy, Right Chest Wall SMOOTH MUSCLE NEOPLASM Microscopic Comment The differential diagnosis include low grade leiomyosarcoma. The neoplasm shows spindle cell morphology with rare mitotic figures and minimal atypia, no necrosis present. These cells stains positive for smooth muscle actin, desmin, negative for cytokeratin AE1&3, ck8/18, s100, cd117, cd 34 and cd99. This case also reviewed by Dr. [name] and agree.      CANCER STAGING: Cancer Staging Primary cancer of right upper lobe of lung ([facility]) Staging form: Lung, AJCC 8th Edition - Clinical: Stage IVB (cT3, cN2, cM1c) - Signed by Baird Cancer, PA-C on 05/07/2016   INTERVAL HISTORY:  Mr. Darrell Christensen, a 74 y.o. male, returns for routine follow-up and consideration for next cycle of chemotherapy. Jolan was last seen on 01/06/2020.  Due for cycle #18 of Keytruda today.   Today he reports having 4 episodes of loosely formed stool in the morning which improves to formed stool as the day progresses; he also reports having formed melena at the end of the day.  However he had only 1 stool per day for the last 4 days.  Stool was well formed.  He continues taking 60 mg prednisone daily.  We are not sure he is taking Imodium. His appetite is good and he has started eating more meals recently since his diarrhea has been improving.   REVIEW OF SYSTEMS:  Review of Systems  Constitutional: Positive for fatigue (moderate). Negative for appetite change.  Gastrointestinal: Positive for diarrhea (3-4 episodes in AM).  All other systems reviewed and are negative.   PAST MEDICAL/SURGICAL HISTORY:  Past Medical History:  Diagnosis Date  . Adrenal mass, left  ([facility]) 04/01/2016  . Diabetes mellitus ([facility]) 05/02/2016  . Diabetes mellitus without complication ([facility])   . Hypertension   . Mass of breast, right 04/01/2016  . Mass of upper lobe of right lung 04/01/2016   2 adjacent, 5 cm masses, hilar adenopathy  . Primary cancer of right upper lobe of lung ([facility]) 04/01/2016   2 adjacent, 5 cm masses, hilar adenopathy  . Pulmonary embolus ([place]) 03/31/2016  . 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 COLONOSCOPY N/A 07/28/2017   Procedure: COLONOSCOPY;  Surgeon: Rogene Houston, MD;  Location: [facility] ENDO SUITE;  Service: Endoscopy;  Laterality: N/A;  205  . PORTACATH PLACEMENT Left 05/13/2016   Procedure: INSERTION PORT-A-CATH;  Surgeon: Aviva Signs, MD;  Location: [facility] ORS;  Service: General;  Laterality: Left;    SOCIAL HISTORY:  Social History   Socioeconomic History  . Marital status: Divorced    Spouse name: Not on file  . Number of children: Not on file  . Years of education: Not on file  . Highest education level: Not on file  Occupational History  . Not on file  Tobacco Use  . Smoking status: Former Smoker    Years: 61.00    Quit date: 04/11/2014    Years since quitting: 5.7  . Smokeless tobacco: Never Used  . Tobacco comment: patient does vape smoking x 2 years  Vaping Use  .  Vaping Use: Every day  Substance and Sexual Activity  . Alcohol use: Not Currently    Comment: quit 10 years  . Drug use: No  . Sexual activity: Not on file  Other Topics Concern  . Not on file  Social History Narrative  . Not on file   Social Determinants of Health   Financial Resource Strain:   . Difficulty of Paying Living Expenses: Not on file  Food Insecurity:   . Worried About Charity fundraiser in the Last Year: Not on file  . Ran Out of Food in the Last Year: Not on file  Transportation Needs:   . Lack of Transportation (Medical): Not on file  . Lack of Transportation (Non-Medical): Not on file   Physical Activity:   . Days of Exercise per Week: Not on file  . Minutes of Exercise per Session: Not on file  Stress:   . Feeling of Stress : Not on file  Social Connections:   . Frequency of Communication with Friends and Family: Not on file  . Frequency of Social Gatherings with Friends and Family: Not on file  . Attends Religious Services: Not on file  . Active Member of Clubs or Organizations: Not on file  . Attends Archivist Meetings: Not on file  . Marital Status: Not on file  Intimate Partner Violence:   . Fear of Current or Ex-Partner: Not on file  . Emotionally Abused: Not on file  . Physically Abused: Not on file  . Sexually Abused: Not on file    FAMILY HISTORY:  Family History  Problem Relation Age of Onset  . Stroke Mother   . Heart attack Father   . Heart attack Brother     CURRENT MEDICATIONS:  Current Outpatient Medications  Medication Sig Dispense Refill  . ALPRAZolam (XANAX) 1 MG tablet Take 1 mg by mouth 2 (two) times daily as needed for anxiety.    Marland Kitchen amLODipine (NORVASC) 2.5 MG tablet Take 2.5 mg by mouth daily.  4  . CARBOPLATIN IV Inject into the vein every 21 ( twenty-one) days.    . diphenoxylate-atropine (LOMOTIL) 2.5-0.025 MG tablet Take 2 tablets at first watery stool then 1 tablet with each watery stool. 60 tablet 2  . folic acid (FOLVITE) 1 MG tablet TAKE ONE TABLET BY MOUTH ONCE DAILY. START 5-7 DAYS BEFORE ALIMTA CHEMOTHERAPY AND CONTINUE FOR 21 DAYS AFTER CHEMOTHERAPY. 100 tablet 0  . furosemide (LASIX) 20 MG tablet Take 2 tablets (40 mg total) by mouth daily. 20 tablet 0  . gabapentin (NEURONTIN) 400 MG capsule Take 1 capsule (400 mg total) by mouth 3 (three) times daily. 90 capsule 3  . GLIPIZIDE XL 5 MG 24 hr tablet Take 5 mg by mouth daily.  4  . hydrocortisone 2.5 % lotion Apply topically as needed.     . lactulose (CHRONULAC) 10 GM/15ML solution Take 30 mLs (20 g total) by mouth at bedtime as needed for mild constipation. 480  mL 2  . levocetirizine (XYZAL) 5 MG tablet Take 5 mg by mouth at bedtime.    . lidocaine-prilocaine (EMLA) cream APPLY A QUARTER SIZE AMOUNT TO THE AFFECTED AREA 1 HOUR PRIOR TO COMING TO CHEMOTHERAPY. COVER WITH A PLASTIC WRAP. 30 g 0  . magnesium oxide (MAG-OX) 400 (241.3 Mg) MG tablet Take 1 tablet (400 mg total) by mouth 2 (two) times daily. 60 tablet 2  . metFORMIN (GLUCOPHAGE) 500 MG tablet Take 500 mg by mouth 2 (two) times  daily.  4  . metoprolol succinate (TOPROL-XL) 25 MG 24 hr tablet TAKE ONE TABLET BY MOUTH IN THE EVENING. 30 tablet 0  . ondansetron (ZOFRAN) 4 MG tablet Take 1 tablet (4 mg total) by mouth every 8 (eight) hours as needed for nausea or vomiting. 4 tablet 0  . oxyCODONE-acetaminophen (PERCOCET) 10-325 MG tablet Take 1 tablet by mouth as needed.   0  . Pembrolizumab (KEYTRUDA IV) Inject into the vein.    Marland Kitchen PEMEtrexed 500 mg/m2 in sodium chloride 0.9 % 100 mL Inject into the vein every 21 ( twenty-one) days.    . potassium chloride (K-DUR,KLOR-CON) 10 MEQ tablet Take 10 mEq by mouth daily.   0  . predniSONE (DELTASONE) 20 MG tablet Take 4 tablets (80 mg total) by mouth daily. 54 tablet 0  . prochlorperazine (COMPAZINE) 10 MG tablet Take 1 tablet (10 mg total) by mouth every 6 (six) hours as needed (Nausea or vomiting). 30 tablet 1  . simvastatin (ZOCOR) 20 MG tablet Take 20 mg by mouth every evening.  5  . torsemide (DEMADEX) 20 MG tablet Take 1 tablet (20 mg total) by mouth daily. 30 tablet 1  . DULoxetine (CYMBALTA) 60 MG capsule Take 60 mg by mouth daily.     No current facility-administered medications for this visit.    ALLERGIES:  No Known Allergies  PHYSICAL EXAM:  Performance status (ECOG): 1 - Symptomatic but completely ambulatory  Vitals:   01/13/20 1044  BP: (!) 153/89  Pulse: 67  Resp: 20  Temp: 97.8 F (36.6 C)  SpO2: 96%   Wt Readings from Last 3 Encounters:  01/13/20 219 lb (99.3 kg)  01/06/20 224 lb 12.8 oz (102 kg)  12/30/19 216 lb 4.3 oz  (98.1 kg)   Physical Exam Vitals reviewed.  Constitutional:      Appearance: Normal appearance. He is obese.  Cardiovascular:     Rate and Rhythm: Normal rate and regular rhythm.     Pulses: Normal pulses.     Heart sounds: Normal heart sounds.  Pulmonary:     Effort: Pulmonary effort is normal.     Breath sounds: Normal breath sounds.  Chest:     Comments: Port-a-Cath in L chest Musculoskeletal:     Right lower leg: Edema (trace) present.     Left lower leg: Edema (trace) present.  Neurological:     General: No focal deficit present.     Mental Status: He is alert and oriented to person, place, and time.  Psychiatric:        Mood and Affect: Mood normal.        Behavior: Behavior normal.     LABORATORY DATA:  I have reviewed the labs as listed.  CBC Latest Ref Rng & Units 01/13/2020 01/06/2020 12/30/2019  WBC 4.0 - 10.5 K/uL 16.4(H) 15.0(H) 8.5  Hemoglobin 13.0 - 17.0 g/dL 14.5 13.6 13.8  Hematocrit 39 - 52 % 45.9 42.8 43.0  Platelets 150 - 400 K/uL 223 260 256   CMP Latest Ref Rng & Units 01/13/2020 01/06/2020 12/30/2019  Glucose 70 - 99 mg/dL 165(H) 112(H) 109(H)  BUN 8 - 23 mg/dL 42(H) 25(H) 21  Creatinine 0.61 - 1.24 mg/dL 1.90(H) 1.58(H) 1.80(H)  Sodium 135 - 145 mmol/L 141 140 138  Potassium 3.5 - 5.1 mmol/L 4.1 3.2(L) 3.3(L)  Chloride 98 - 111 mmol/L 100 104 103  CO2 22 - 32 mmol/L '29 25 24  ' Calcium 8.9 - 10.3 mg/dL 9.0 8.3(L) 8.6(L)  Total Protein  6.5 - 8.1 g/dL 6.3(L) 6.4(L) 7.1  Total Bilirubin 0.3 - 1.2 mg/dL 0.7 0.7 0.6  Alkaline Phos 38 - 126 U/L 57 57 61  AST 15 - 41 U/L 20 13(L) 13(L)  ALT 0 - 44 U/L 37 17 12    DIAGNOSTIC IMAGING:  I have independently reviewed the scans and discussed with the patient. CT Chest W Contrast  Result Date: 12/29/2019 CLINICAL DATA:  Right lung cancer, status post chemotherapy EXAM: CT CHEST WITH CONTRAST TECHNIQUE: Multidetector CT imaging of the chest was performed during intravenous contrast administration. CONTRAST:  14m  OMNIPAQUE IOHEXOL 300 MG/ML  SOLN COMPARISON:  09/30/2019 FINDINGS: Cardiovascular: The heart is top-normal in size. No pericardial effusion. No evidence of thoracic aortic aneurysm. Atherosclerotic calcifications of the aortic arch. Three vessel coronary atherosclerosis. Left chest port terminates in the proximal SVC. Mediastinum/Nodes: Small mediastinal lymph nodes, including a dominant 8 mm short axis subcarinal node, within normal limits. 12 mm short axis right hilar node (series 2/image 65), previously 11 mm, grossly unchanged. Visualized thyroid is notable for a 14 mm right thyroid nodule, unchanged. Not clinically significant; no follow-up imaging recommended (ref: J Am Coll Radiol. 2015 Feb;12(2): 143-50). Lungs/Pleura: 2.1 x 4.0 cm posterior right upper lobe mass (series 4/image 54), previously 2.3 x 3.8 cm, grossly unchanged. Additional 6 mm nodule in the anterior right upper lobe (series 4/image 60), 4 x 9 mm nodule in the left upper lobe (series 4/image 9), and 7 mm irregular nodule in the right middle lobe (series 4/image 84), grossly unchanged. Subpleural reticulation/fibrosis in the lungs bilaterally, lower lobe predominant, favoring mild to moderate chronic interstitial lung disease. No focal consolidation. No pleural effusion or pneumothorax. Upper Abdomen: 2.8 x 1.6 cm left adrenal nodule (series 2/image 139), unchanged when measured in a similar fashion. Hepatic steatosis with focal fatty sparing. Vascular calcifications. Musculoskeletal: Stable 15 mm inferior right breast nodule (series 2/image 89). Degenerative changes of the visualized thoracolumbar spine. IMPRESSION: 4.0 cm posterior right upper lobe mass, corresponding to the patient's known primary bronchogenic neoplasm, grossly unchanged. Additional stable subcentimeter bilateral pulmonary nodules. Stable 12 mm right hilar node. Additional small mediastinal nodes, within normal limits. Stable 2.8 cm left adrenal nodule. Additional stable  ancillary findings as above. Aortic Atherosclerosis (ICD10-I70.0). Electronically Signed   By: SJulian HyM.D.   On: 12/29/2019 14:58   MR Brain W Wo Contrast  Result Date: 12/23/2019 CLINICAL DATA:  Brain metastasis. Follow-up of treatment to brain; brain/CNS neoplasm, surveillance. Additional history obtained from [facility], prior SRS to right parietal lobe lesion July 2020. EXAM: MRI HEAD WITHOUT AND WITH CONTRAST TECHNIQUE: Multiplanar, multiecho pulse sequences of the brain and surrounding structures were obtained without and with intravenous contrast. CONTRAST:  177mGADAVIST GADOBUTROL 1 MMOL/ML IV SOLN COMPARISON:  Prior brain MRI examinations 09/16/2019 and earlier. FINDINGS: Brain: Multiple sequences are significantly motion degraded, limiting evaluation. The axial SWI sequence and axial T1 weighted postcontrast sequence are most notably affected. Stable, moderate generalized parenchymal atrophy. Unchanged size of a 6 mm enhancing lesion within the right parietal lobe (series 9, image 73) (remeasured on prior). A subtle interval increase in extent of surrounding T2 hyperintensity is questioned. Within the limitations of motion degradation, no new intracranial metastasis is identified. Stable background moderate multifocal T2/FLAIR hyperintensity within the cerebral white matter and pons which is nonspecific, but consistent with chronic small vessel ischemic disease. There is no acute infarct. No extra-axial fluid collection. No midline shift. Vascular: Expected proximal arterial  flow voids. Skull and upper cervical spine: No focal marrow lesion Sinuses/Orbits: Visualized orbits show no acute finding. Mild paranasal sinus mucosal thickening. Small right maxillary sinus mucous retention cyst. No significant mastoid effusion. IMPRESSION: Motion degraded examination as described. This includes significant motion degradation of the axial T1 weighted postcontrast  sequence. Unchanged size of a 6 mm treated enhancing lesion within the right parietal lobe. A subtle interval increase in surrounding T2 hyperintensity is questioned, although this may simply reflect interval progression of treatment related changes. Attention recommended on follow-up. Within the limitations of motion degradation, no new intracranial metastasis is identified. Stable generalized parenchymal atrophy and chronic small vessel ischemic disease. Electronically Signed   By: Kellie Simmering DO   On: 12/23/2019 20:53     ASSESSMENT:  1. Stage IV adenocarcinoma lung, PD-L1 50%, other actionable mutations negative: --Keytruda from 05/17/2016 through 09/24/2018 with mild progression. -4 cycles of carboplatin, pemetrexed from 12/25/2018 through 03/03/2019. -Pemetrexed maintenance from 03/24/2019 through 04/12/2019 discontinued secondary to renal insufficiency. -CT chest with contrast from 09/30/2019 shows right upper lobe lung lesion measuring 3.8 x 2.3)(4.2 x 2.6 cm). No change in the left upper lobe nodule. Subcarinal lymph node measures 11 mm, previously 7 mm. No definite left hilar adenopathy. Stable left adrenal nodule. -CT chest on 12/29/2019 shows 4 cm posterior right upper lobe mass grossly unchanged. Stable 12 mm right hilar node. Stable 2.8 cm left adrenal nodule.  2. Brain metastasis: -SRS to the right parietal lobe lesion with surrounding edema on 11/24/2018. -MRI of the brain on 09/16/2019 shows stable treated right parietal metastasis. No new mass or abnormal enhancement. -MRI of the brain on 12/23/2019 shows unchanged 6 mm treated enhancing lesion in the right parietal lobe. No new lesions.   PLAN:  1. Stage IV adenocarcinoma lung, PD-L1 50%, other actionable mutations negative: -Pembrolizumab held since 12/30/2019 for diarrhea.  He is being treated with prednisone.  I will continue to hold pembrolizumab today. -I reviewed his labs today which showed normal LFTs.  Creatinine has  increased to 1.9. -We will get him some normal saline today.  He was told to drink lots of water.  Elevated white count of 16.4 secondary to prednisone.  2. Brain metastasis: -Does not have any symptoms of recurrence at this time.  3. Itching rash: -Continue moisturizing lotion.  4. Peripheral neuropathy: -Continue gabapentin and duloxetine.  5. Hypomagnesemia: -Continue magnesium twice daily.  6.  Immunotherapy related colitis: -Even though I have increased the prednisone to 80 mg daily last week, he will continue taking 60 mg daily. -He takes pills from pill pack.  He does not know if he is taking Lomotil or Imodium. -However he reported improvement in his stools.  For the last 4 days he has been having loose stool well formed 1 bowel movement per day. -No need for infliximab at this time.  I will cut back on prednisone to 40 mg daily.  He will be seen back in 1 week for follow-up. -He reported some black stools.  We will start him on Pepcid 20 mg daily.   Orders placed this encounter:  No orders of the defined types were placed in this encounter.    Derek Jack, MD [place] [phone]   I, Milinda Antis, am acting as a scribe for Dr. Sanda Linger.  I, Derek Jack MD, have reviewed the above documentation for accuracy and completeness, and I agree with the above.

## 2020-01-13 NOTE — Patient Instructions (Signed)
[place] at [facility] Discharge Instructions  You were seen today by Dr. Delton Coombes. He went over your recent results. You did not receive your treatment today; you only received fluid today. Take 2 tablets (40 mg) of prednisone daily. Purchase Pepcid over the counter and take 1 tablet daily. Dr. Delton Coombes will see you back in 1 week for labs and follow up.   Thank you for choosing [place] at [facility] to provide your oncology and hematology care.  To afford each patient quality time with our provider, please arrive at least 15 minutes before your scheduled appointment time.   If you have a lab appointment with the [place] please come in thru the Main Entrance and check in at the main information desk  You need to re-schedule your appointment should you arrive 10 or more minutes late.  We strive to give you quality time with our providers, and arriving late affects you and other patients whose appointments are after yours.  Also, if you no show three or more times for appointments you may be dismissed from the clinic at the providers discretion.     Again, thank you for choosing [facility].  Our hope is that these requests will decrease the amount of time that you wait before being seen by our physicians.       _____________________________________________________________  Should you have questions after your visit to [facility], please contact our office at [phone] between the hours of 8:00 a.m. and 4:30 p.m.  Voicemails left after 4:00 p.m. will not be returned until the following business day.  For prescription refill requests, have your pharmacy contact our office and allow 72 hours.    Cancer Center Support Programs:   > Cancer Support Group  2nd Tuesday of the month 1pm-2pm, Journey Room

## 2020-01-13 NOTE — Patient Instructions (Signed)
[place] at [facility]  Discharge Instructions:  IV hydration received today. Follow up next week as previously scheduled. _______________________________________________________________  Thank you for choosing [place] at [facility] to provide your oncology and hematology care.  To afford each patient quality time with our providers, please arrive at least 15 minutes before your scheduled appointment.  You need to re-schedule your appointment if you arrive 10 or more minutes late.  We strive to give you quality time with our providers, and arriving late affects you and other patients whose appointments are after yours.  Also, if you no show three or more times for appointments you may be dismissed from the clinic.  Again, thank you for choosing [place] at [place] hope is that these requests will allow you access to exceptional care and in a timely manner. _______________________________________________________________  If you have questions after your visit, please contact our office at [phone] between the hours of 8:30 a.m. and 5:00 p.m. Voicemails left after 4:30 p.m. will not be returned until the following business day. _______________________________________________________________  For prescription refill requests, have your pharmacy contact our office. _______________________________________________________________  Recommendations made by the consultant and any test results will be sent to your referring physician. _______________________________________________________________

## 2020-01-17 DIAGNOSIS — L299 Pruritus, unspecified: Secondary | ICD-10-CM | POA: Diagnosis not present

## 2020-01-17 DIAGNOSIS — C7989 Secondary malignant neoplasm of other specified sites: Secondary | ICD-10-CM | POA: Diagnosis not present

## 2020-01-17 DIAGNOSIS — G990 Autonomic neuropathy in diseases classified elsewhere: Secondary | ICD-10-CM | POA: Diagnosis not present

## 2020-01-17 DIAGNOSIS — C3411 Malignant neoplasm of upper lobe, right bronchus or lung: Secondary | ICD-10-CM | POA: Diagnosis not present

## 2020-01-20 ENCOUNTER — Other Ambulatory Visit: Payer: Self-pay

## 2020-01-20 ENCOUNTER — Inpatient Hospital Stay (HOSPITAL_COMMUNITY): Payer: Medicare Other

## 2020-01-20 ENCOUNTER — Inpatient Hospital Stay (HOSPITAL_COMMUNITY): Payer: Medicare Other | Admitting: Hematology

## 2020-01-20 ENCOUNTER — Encounter (HOSPITAL_COMMUNITY): Payer: Self-pay | Admitting: Hematology

## 2020-01-20 VITALS — BP 166/77 | HR 69 | Temp 97.3°F | Resp 20 | Wt 221.1 lb

## 2020-01-20 DIAGNOSIS — C7931 Secondary malignant neoplasm of brain: Secondary | ICD-10-CM | POA: Diagnosis not present

## 2020-01-20 DIAGNOSIS — C3411 Malignant neoplasm of upper lobe, right bronchus or lung: Secondary | ICD-10-CM

## 2020-01-20 DIAGNOSIS — Z5112 Encounter for antineoplastic immunotherapy: Secondary | ICD-10-CM | POA: Diagnosis not present

## 2020-01-20 DIAGNOSIS — Z79899 Other long term (current) drug therapy: Secondary | ICD-10-CM | POA: Diagnosis not present

## 2020-01-20 LAB — CBC WITH DIFFERENTIAL/PLATELET
Abs Immature Granulocytes: 0.11 10*3/uL — ABNORMAL HIGH (ref 0.00–0.07)
Basophils Absolute: 0 10*3/uL (ref 0.0–0.1)
Basophils Relative: 0 %
Eosinophils Absolute: 0 10*3/uL (ref 0.0–0.5)
Eosinophils Relative: 0 %
HCT: 46.5 % (ref 39.0–52.0)
Hemoglobin: 14.6 g/dL (ref 13.0–17.0)
Immature Granulocytes: 1 %
Lymphocytes Relative: 14 %
Lymphs Abs: 1.7 10*3/uL (ref 0.7–4.0)
MCH: 29.6 pg (ref 26.0–34.0)
MCHC: 31.4 g/dL (ref 30.0–36.0)
MCV: 94.1 fL (ref 80.0–100.0)
Monocytes Absolute: 0.2 10*3/uL (ref 0.1–1.0)
Monocytes Relative: 2 %
Neutro Abs: 10 10*3/uL — ABNORMAL HIGH (ref 1.7–7.7)
Neutrophils Relative %: 83 %
Platelets: 196 10*3/uL (ref 150–400)
RBC: 4.94 MIL/uL (ref 4.22–5.81)
RDW: 15.3 % (ref 11.5–15.5)
WBC: 12.1 10*3/uL — ABNORMAL HIGH (ref 4.0–10.5)
nRBC: 0 % (ref 0.0–0.2)

## 2020-01-20 LAB — COMPREHENSIVE METABOLIC PANEL
ALT: 22 U/L (ref 0–44)
AST: 16 U/L (ref 15–41)
Albumin: 3.7 g/dL (ref 3.5–5.0)
Alkaline Phosphatase: 67 U/L (ref 38–126)
Anion gap: 10 (ref 5–15)
BUN: 30 mg/dL — ABNORMAL HIGH (ref 8–23)
CO2: 25 mmol/L (ref 22–32)
Calcium: 8.7 mg/dL — ABNORMAL LOW (ref 8.9–10.3)
Chloride: 101 mmol/L (ref 98–111)
Creatinine, Ser: 1.67 mg/dL — ABNORMAL HIGH (ref 0.61–1.24)
GFR calc Af Amer: 46 mL/min — ABNORMAL LOW (ref >60)
GFR calc non Af Amer: 39 mL/min — ABNORMAL LOW (ref >60)
Glucose, Bld: 200 mg/dL — ABNORMAL HIGH (ref 70–99)
Potassium: 4.5 mmol/L (ref 3.5–5.1)
Sodium: 136 mmol/L (ref 135–145)
Total Bilirubin: 0.6 mg/dL (ref 0.3–1.2)
Total Protein: 6.7 g/dL (ref 6.5–8.1)

## 2020-01-20 LAB — MAGNESIUM: Magnesium: 2.5 mg/dL — ABNORMAL HIGH (ref 1.7–2.4)

## 2020-01-20 MED ORDER — PREDNISONE 20 MG PO TABS: 50.0000 mg | ORAL_TABLET | Freq: Every day | ORAL | 0 refills | Status: AC

## 2020-01-20 NOTE — Progress Notes (Signed)
Patient seen today by Dr. Delton Coombes, his diarrhea has increased.  Dr. Delton Coombes has increased his prednisone to 50 mg daily.  New prescription sent to pharmacy, patient aware to increase his dose. His magnesium is up to 2.5, we have instructed patient to only take 1 tablet twice daily at this time. He verbalizes understanding of the above.

## 2020-01-20 NOTE — Patient Instructions (Signed)
[place] at [facility] Discharge Instructions  You were seen today by Dr. Delton Coombes. He went over your recent results. Start taking 2.5 tablets of prednisone every morning. Take 1 tablet of magnesium in the morning and 1 tablet in the evening. Dr. Delton Coombes will see you back in 1 week for labs and follow up.   Thank you for choosing [place] at [facility] to provide your oncology and hematology care.  To afford each patient quality time with our provider, please arrive at least 15 minutes before your scheduled appointment time.   If you have a lab appointment with the [place] please come in thru the Main Entrance and check in at the main information desk  You need to re-schedule your appointment should you arrive 10 or more minutes late.  We strive to give you quality time with our providers, and arriving late affects you and other patients whose appointments are after yours.  Also, if you no show three or more times for appointments you may be dismissed from the clinic at the providers discretion.     Again, thank you for choosing [facility].  Our hope is that these requests will decrease the amount of time that you wait before being seen by our physicians.       _____________________________________________________________  Should you have questions after your visit to [facility], please contact our office at [phone] between the hours of 8:00 a.m. and 4:30 p.m.  Voicemails left after 4:00 p.m. will not be returned until the following business day.  For prescription refill requests, have your pharmacy contact our office and allow 72 hours.    Cancer Center Support Programs:   > Cancer Support Group  2nd Tuesday of the month 1pm-2pm, Journey Room

## 2020-01-20 NOTE — Progress Notes (Signed)
[facility] [address], [place] [address]   CLINIC:  Medical Oncology/Hematology  PCP:  Celene Squibb, MD [address] Liana Crocker [place] [address] [phone]   REASON FOR VISIT:  Follow-up for stage IV adenocarcinoma of right lung  PRIOR THERAPY:  1. Keytruda from 05/17/2016 to 09/24/2018. 2. Carboplatin, pemetrexed x 4 cycles from 12/25/2018 to 03/03/2019. 3. Keytruda x 17 cycles from 12/16/2018 to 12/09/2019.  NGS Results: PD-L1 50%  CURRENT THERAPY: Observation  BRIEF ONCOLOGIC HISTORY:  Oncology History  Primary cancer of right upper lobe of lung ([facility])  05/14/2015 Procedure   Port placement by Dr. Arnoldo Morale   04/01/2016 Initial Diagnosis   Primary cancer of right upper lobe of lung ([facility])   04/02/2016 Procedure   Ultrasound-guided core biopsy performed of a solid 1.5 cm soft tissue nodule in the right chest wall.   04/03/2016 Imaging   MRI brain- No acute and intracranial process or metastasis.  Moderate chronic small vessel ischemic disease.   04/04/2016 Pathology Results   Diagnosis Soft Tissue Needle Core Biopsy, Right Chest Wall SMOOTH MUSCLE NEOPLASM Microscopic Comment The differential diagnosis include low grade leiomyosarcoma. The neoplasm shows spindle cell morphology with rare mitotic figures and minimal atypia, no necrosis present. These cells stains positive for smooth muscle actin, desmin, negative for cytokeratin AE1&3, ck8/18, s100, cd117, cd 34 and cd99. This case also reviewed by Dr. [name] and agree.   04/05/2016 Procedure   CT-guided biopsy of right upper lobe lesion, with tissue specimen sent to pathology, by IR.   04/09/2016 Pathology Results   Lung, needle/core biopsy(ies), Right Upper Lobe - NON SMALL CELL CARCINOMA.   04/17/2016 Pathology Results   PDL1 High Expression.  TPS 50%.   04/24/2016 PET scan   1. Prominently hypermetabolic large right upper lobe mass with metastatic disease to the right  paratracheal and right hilar nodal chains, to the descending mesocolon, to the left upper buttock subcutaneous tissues, to the left upper lobe, to the right subscapularis muscle, and possibly to the left adrenal gland and right breast subcutaneous tissues. Appearance compatible with stage IV lung cancer. 2. There is some focal high activity in the ascending colon which is probably from peristalsis, less likely from a local colon mass. This does raise the possibility that the adjacent mesocolon tumor implant could be due to synchronous colon cancer rather than lung metastasis. 3. Other imaging findings of potential clinical significance: Coronary, aortic arch, and branch vessel atherosclerotic vascular disease. Aortoiliac atherosclerotic vascular disease. Deformity left proximal humerus and left glenohumeral joint from prior trauma. Enlarged prostate gland.   04/26/2016 Pathology Results   FoundationONE: Genomic alterations identified- KRAS S56C, CREBBP splice site [id number], LRP1B S515*, YFV49 S49*, QPR91 splice site [id number], SPTA1 splice site [id number], TP53 C135Y.  Additional findings- MS-Stable, TMB-intermediate (14 Muts/Mb).  No reportable alterations- EGFR, ALK, BRAF, MET, ERBB2, RET, ROS1.   05/17/2016 -  Chemotherapy   The patient had ONDANSETRON IVPB [facility] +/- DEXAMETHASONE, , Intravenous,  Once, 0 of 1 cycle  pembrolizumab (KEYTRUDA) 200 mg in sodium chloride 0.9 % 50 mL chemo infusion, 200 mg, Intravenous, Once, 0 of 5 cycles  for chemotherapy treatment.     08/06/2016 PET scan   IMPRESSION: 1. Overall considerable improvement. The right upper lobe mass is markedly reduced in volume and SUV. Thoracic adenopathy have resolved and the hypermetabolic lesion in the right subscapularis muscle has also resolved. 2. The left upper lobe nodule is no longer appreciably hypermetabolic,  and is highly indistinct although this may be due to motion artifact. Faint in indistinct  nodularity elsewhere in the lungs likewise not currently hypermetabolic. 3. There is some very faint residual low-grade activity along the subcutaneous deposit along the upper left buttock region. Marked reduction in size and activity of the tumor deposit adjacent to the descending colon. 4. Prior right pleural effusion has resolved. 5. Stable appearance of the adrenal glands, with low-level activity and a nodule in the left adrenal gland. 6. Stable small nodule in the right breast, low-grade metabolic activity with maximum SUV 2.2 (formerly 3.0) 7. Other imaging findings of potential clinical significance: Severe arthropathy of the left glenohumeral joint. Coronary, aortic arch, and branch vessel atherosclerotic vascular disease. Aortoiliac atherosclerotic vascular disease. Prominent prostate gland indents the bladder base.    12/16/2018 -  Chemotherapy   The patient had palonosetron (ALOXI) injection 0.25 mg, 0.25 mg, Intravenous,  Once, 4 of 4 cycles Administration: 0.25 mg (12/16/2018), 0.25 mg (01/27/2019), 0.25 mg (03/03/2019), 0.25 mg (01/06/2019) pegfilgrastim (NEULASTA) injection 6 mg, 6 mg, Subcutaneous, Once, 2 of 2 cycles Administration: 6 mg (01/08/2019) pegfilgrastim (NEULASTA ONPRO KIT) injection 6 mg, 6 mg, Subcutaneous, Once, 2 of 2 cycles pegfilgrastim-cbqv (UDENYCA) injection 6 mg, 6 mg, Subcutaneous, Once, 2 of 2 cycles Administration: 6 mg (01/29/2019), 6 mg (03/05/2019) ondansetron (ZOFRAN) 8 mg in sodium chloride 0.9 % 50 mL IVPB, 8 mg (100 % of original dose 8 mg), Intravenous,  Once, 3 of 3 cycles Dose modification: 8 mg (original dose 8 mg, Cycle 5) Administration: 8 mg (03/24/2019), 8 mg (04/16/2019) PEMEtrexed (ALIMTA) 1,100 mg in sodium chloride 0.9 % 100 mL chemo infusion, 490 mg/m2 = 1,125 mg, Intravenous,  Once, 6 of 6 cycles Dose modification: 400 mg/m2 (80 % of original dose 500 mg/m2, Cycle 3, Reason: Other (see comments), Comment: rash,  swelling) Administration: 1,100 mg (12/16/2018), 900 mg (01/27/2019), 900 mg (03/03/2019), 900 mg (03/24/2019), 900 mg (04/16/2019), 1,100 mg (01/06/2019) CARBOplatin (PARAPLATIN) 500 mg in sodium chloride 0.9 % 250 mL chemo infusion, 530 mg (110.7 % of original dose 477.5 mg), Intravenous,  Once, 4 of 4 cycles Dose modification:   (original dose 477.5 mg, Cycle 1),   (original dose 485.5 mg, Cycle 3),   (original dose 494 mg, Cycle 4),   (original dose 494 mg, Cycle 2) Administration: 500 mg (12/16/2018), 490 mg (01/27/2019), 490 mg (03/03/2019), 490 mg (01/06/2019) pembrolizumab (KEYTRUDA) 200 mg in sodium chloride 0.9 % 50 mL chemo infusion, 2 mg/kg = 200 mg, Intravenous, Once, 12 of 13 cycles Dose modification: 200 mg (original dose 2 mg/kg, Cycle 14, Reason: Provider Judgment) Administration: 200 mg (04/16/2019), 200 mg (05/31/2019), 200 mg (06/22/2019), 200 mg (07/13/2019), 200 mg (05/10/2019), 200 mg (08/03/2019), 200 mg (08/24/2019), 200 mg (09/14/2019), 200 mg (10/08/2019), 200 mg (10/29/2019), 200 mg (11/18/2019), 200 mg (12/09/2019) fosaprepitant (EMEND) 150 mg, dexamethasone (DECADRON) 12 mg in sodium chloride 0.9 % 145 mL IVPB, , Intravenous,  Once, 4 of 4 cycles Administration:  (12/16/2018),  (01/27/2019),  (03/03/2019),  (01/06/2019)  for chemotherapy treatment.    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  Two adjacent large masslike areas of consolidation within the right upper lobe with differential considerations including malignancy or pneumonia. Multiple enlarged right hilar and mediastinal lymph nodes which may be reactive or metastatic in etiology.  Additional indeterminate pulmonary nodules as above. Recommend attention on  follow-up.  Indeterminate 2.9 cm left adrenal nodule. This needs dedicated evaluation with pre and post contrast-enhanced CT or MRI  after confirmation of pulmonary process.  Indeterminate nodule within the right breast. Recommend dedicated evaluation with mammography.  Hepatic steatosis.   04/02/2016 Procedure   Ultrasound-guided core biopsy performed of a solid 1.5 cm soft tissue nodule in the right chest wall.   04/04/2016 Pathology Results   Diagnosis Soft Tissue Needle Core Biopsy, Right Chest Wall SMOOTH MUSCLE NEOPLASM Microscopic Comment The differential diagnosis include low grade leiomyosarcoma. The neoplasm shows spindle cell morphology with rare mitotic figures and minimal atypia, no necrosis present. These cells stains positive for smooth muscle actin, desmin, negative for cytokeratin AE1&3, ck8/18, s100, cd117, cd 34 and cd99. This case also reviewed by Dr. [name] and agree.      CANCER STAGING: Cancer Staging Primary cancer of right upper lobe of lung ([facility]) Staging form: Lung, AJCC 8th Edition - Clinical: Stage IVB (cT3, cN2, cM1c) - Signed by Baird Cancer, PA-C on 05/07/2016   INTERVAL HISTORY:  Mr. Darrell Christensen, a 74 y.o. male, returns for routine follow-up of his stage IV adenocarcinoma of right lung. Yanky was last seen on 01/13/2020.  Today he reports feeling fair. He reports having 2 watery BM's in the morning and 2-3 overnight. He takes 2 tablets of prednisone every morning and magnesium TID. He has moderate upper abdominal pain but denies melena or hematochezia. His baseline was constipation. He does not have a cough. His appetite is good and he is eating well and stopped drinking Boost.   REVIEW OF SYSTEMS:  Review of Systems  Constitutional: Positive for appetite change (mildly decreased) and fatigue (depleted).  Respiratory: Positive for shortness of breath (w/ exertion). Negative for cough.   Cardiovascular: Positive for leg swelling (& feet).  Gastrointestinal: Positive for abdominal pain (4/10 upper abdominal pain) and diarrhea. Negative for blood in stool.   Neurological: Positive for dizziness (intermittent) and numbness (hands & feet).  All other systems reviewed and are negative.   PAST MEDICAL/SURGICAL HISTORY:  Past Medical History:  Diagnosis Date  . Adrenal mass, left ([facility]) 04/01/2016  . Diabetes mellitus ([facility]) 05/02/2016  . Diabetes mellitus without complication ([facility])   . Hypertension   . Mass of breast, right 04/01/2016  . Mass of upper lobe of right lung 04/01/2016   2 adjacent, 5 cm masses, hilar adenopathy  . Primary cancer of right upper lobe of lung ([facility]) 04/01/2016   2 adjacent, 5 cm masses, hilar adenopathy  . Pulmonary embolus ([facility]) 03/31/2016  . 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 COLONOSCOPY N/A 07/28/2017   Procedure: COLONOSCOPY;  Surgeon: Rogene Houston, MD;  Location: [facility] ENDO SUITE;  Service: Endoscopy;  Laterality: N/A;  205  . PORTACATH PLACEMENT Left 05/13/2016   Procedure: INSERTION PORT-A-CATH;  Surgeon: Aviva Signs, MD;  Location: [facility] ORS;  Service: General;  Laterality: Left;    SOCIAL HISTORY:  Social History   Socioeconomic History  . Marital status: Divorced    Spouse name: Not on file  . Number of children: Not on file  . Years of education: Not on file  . Highest education level: Not on file  Occupational History  . Not on file  Tobacco Use  . Smoking status: Former Smoker    Years: 61.00    Quit date: 04/11/2014    Years since quitting: 5.7  . Smokeless tobacco: Never Used  .  Tobacco comment: patient does vape smoking x 2 years  Vaping Use  . Vaping Use: Every day  Substance and Sexual Activity  . Alcohol use: Not Currently    Comment: quit 10 years  . Drug use: No  . Sexual activity: Not on file  Other Topics Concern  . Not on file  Social History Narrative  . Not on file   Social Determinants of Health   Financial Resource Strain:   . Difficulty of Paying Living Expenses: Not on file  Food Insecurity:   . Worried About  Charity fundraiser in the Last Year: Not on file  . Ran Out of Food in the Last Year: Not on file  Transportation Needs:   . Lack of Transportation (Medical): Not on file  . Lack of Transportation (Non-Medical): Not on file  Physical Activity:   . Days of Exercise per Week: Not on file  . Minutes of Exercise per Session: Not on file  Stress:   . Feeling of Stress : Not on file  Social Connections:   . Frequency of Communication with Friends and Family: Not on file  . Frequency of Social Gatherings with Friends and Family: Not on file  . Attends Religious Services: Not on file  . Active Member of Clubs or Organizations: Not on file  . Attends Archivist Meetings: Not on file  . Marital Status: Not on file  Intimate Partner Violence:   . Fear of Current or Ex-Partner: Not on file  . Emotionally Abused: Not on file  . Physically Abused: Not on file  . Sexually Abused: Not on file    FAMILY HISTORY:  Family History  Problem Relation Age of Onset  . Stroke Mother   . Heart attack Father   . Heart attack Brother     CURRENT MEDICATIONS:  Current Outpatient Medications  Medication Sig Dispense Refill  . diphenoxylate-atropine (LOMOTIL) 2.5-0.025 MG tablet Take 2 tablets at first watery stool then 1 tablet with each watery stool. 60 tablet 2  . ALPRAZolam (XANAX) 1 MG tablet Take 1 mg by mouth 2 (two) times daily as needed for anxiety.    Marland Kitchen amLODipine (NORVASC) 2.5 MG tablet Take 2.5 mg by mouth daily.  4  . DULoxetine (CYMBALTA) 60 MG capsule Take 60 mg by mouth daily.    . famotidine (PEPCID) 20 MG tablet Take 1 tablet (20 mg total) by mouth daily. 30 tablet 2  . folic acid (FOLVITE) 1 MG tablet TAKE ONE TABLET BY MOUTH ONCE DAILY. START 5-7 DAYS BEFORE ALIMTA CHEMOTHERAPY AND CONTINUE FOR 21 DAYS AFTER CHEMOTHERAPY. 100 tablet 0  . furosemide (LASIX) 20 MG tablet Take 2 tablets (40 mg total) by mouth daily. 20 tablet 0  . gabapentin (NEURONTIN) 400 MG capsule Take 1  capsule (400 mg total) by mouth 3 (three) times daily. 90 capsule 3  . GLIPIZIDE XL 5 MG 24 hr tablet Take 5 mg by mouth daily.  4  . hydrocortisone 2.5 % lotion Apply topically as needed.     . lactulose (CHRONULAC) 10 GM/15ML solution Take 30 mLs (20 g total) by mouth at bedtime as needed for mild constipation. 480 mL 2  . levocetirizine (XYZAL) 5 MG tablet Take 5 mg by mouth at bedtime.    . lidocaine-prilocaine (EMLA) cream APPLY A QUARTER SIZE AMOUNT TO THE AFFECTED AREA 1 HOUR PRIOR TO COMING TO CHEMOTHERAPY. COVER WITH A PLASTIC WRAP. 30 g 0  . magnesium oxide (MAG-OX) 400 (241.3  Mg) MG tablet Take 1 tablet (400 mg total) by mouth 2 (two) times daily. 60 tablet 2  . metFORMIN (GLUCOPHAGE) 500 MG tablet Take 500 mg by mouth 2 (two) times daily.  4  . metoprolol succinate (TOPROL-XL) 25 MG 24 hr tablet TAKE ONE TABLET BY MOUTH IN THE EVENING. 30 tablet 0  . ondansetron (ZOFRAN) 4 MG tablet Take 1 tablet (4 mg total) by mouth every 8 (eight) hours as needed for nausea or vomiting. 4 tablet 0  . oxyCODONE-acetaminophen (PERCOCET) 10-325 MG tablet Take 1 tablet by mouth as needed.   0  . Pembrolizumab (KEYTRUDA IV) Inject into the vein.    . potassium chloride (K-DUR,KLOR-CON) 10 MEQ tablet Take 10 mEq by mouth daily.   0  . predniSONE (DELTASONE) 20 MG tablet Take 4 tablets (80 mg total) by mouth daily. (Patient taking differently: Take 40 mg by mouth daily. ) 54 tablet 0  . prochlorperazine (COMPAZINE) 10 MG tablet Take 1 tablet (10 mg total) by mouth every 6 (six) hours as needed (Nausea or vomiting). 30 tablet 1  . simvastatin (ZOCOR) 20 MG tablet Take 20 mg by mouth every evening.  5  . torsemide (DEMADEX) 20 MG tablet Take 1 tablet (20 mg total) by mouth daily. 30 tablet 1   No current facility-administered medications for this visit.    ALLERGIES:  No Known Allergies  PHYSICAL EXAM:  Performance status (ECOG): 1 - Symptomatic but completely ambulatory  Vitals:   01/20/20 0840   BP: (!) 166/77  Pulse: 69  Resp: 20  Temp: (!) 97.3 F (36.3 C)  SpO2: 96%   Wt Readings from Last 3 Encounters:  01/20/20 221 lb 1.6 oz (100.3 kg)  01/13/20 219 lb (99.3 kg)  01/06/20 224 lb 12.8 oz (102 kg)   Physical Exam Vitals reviewed.  Constitutional:      Appearance: Normal appearance. He is obese.  Cardiovascular:     Rate and Rhythm: Normal rate and regular rhythm.     Pulses: Normal pulses.     Heart sounds: Normal heart sounds.  Pulmonary:     Effort: Pulmonary effort is normal.     Breath sounds: Normal breath sounds.  Chest:     Comments: Port-a-Cath in L chest Abdominal:     Palpations: Abdomen is soft.     Tenderness: There is no abdominal tenderness.  Skin:    Coloration: Skin is pale.  Neurological:     General: No focal deficit present.     Mental Status: He is alert and oriented to person, place, and time.  Psychiatric:        Mood and Affect: Mood normal.        Behavior: Behavior normal.      LABORATORY DATA:  I have reviewed the labs as listed.  CBC Latest Ref Rng & Units 01/20/2020 01/13/2020 01/06/2020  WBC 4.0 - 10.5 K/uL 12.1(H) 16.4(H) 15.0(H)  Hemoglobin 13.0 - 17.0 g/dL 14.6 14.5 13.6  Hematocrit 39 - 52 % 46.5 45.9 42.8  Platelets 150 - 400 K/uL 196 223 260   CMP Latest Ref Rng & Units 01/20/2020 01/13/2020 01/06/2020  Glucose 70 - 99 mg/dL 200(H) 165(H) 112(H)  BUN 8 - 23 mg/dL 30(H) 42(H) 25(H)  Creatinine 0.61 - 1.24 mg/dL 1.67(H) 1.90(H) 1.58(H)  Sodium 135 - 145 mmol/L 136 141 140  Potassium 3.5 - 5.1 mmol/L 4.5 4.1 3.2(L)  Chloride 98 - 111 mmol/L 101 100 104  CO2 22 - 32 mmol/L 25  29 25  Calcium 8.9 - 10.3 mg/dL 8.7(L) 9.0 8.3(L)  Total Protein 6.5 - 8.1 g/dL 6.7 6.3(L) 6.4(L)  Total Bilirubin 0.3 - 1.2 mg/dL 0.6 0.7 0.7  Alkaline Phos 38 - 126 U/L 67 57 57  AST 15 - 41 U/L 16 20 13(L)  ALT 0 - 44 U/L 22 37 17    DIAGNOSTIC IMAGING:  I have independently reviewed the scans and discussed with the patient. CT Chest W  Contrast  Result Date: 12/29/2019 CLINICAL DATA:  Right lung cancer, status post chemotherapy EXAM: CT CHEST WITH CONTRAST TECHNIQUE: Multidetector CT imaging of the chest was performed during intravenous contrast administration. CONTRAST:  6mL OMNIPAQUE IOHEXOL 300 MG/ML  SOLN COMPARISON:  09/30/2019 FINDINGS: Cardiovascular: The heart is top-normal in size. No pericardial effusion. No evidence of thoracic aortic aneurysm. Atherosclerotic calcifications of the aortic arch. Three vessel coronary atherosclerosis. Left chest port terminates in the proximal SVC. Mediastinum/Nodes: Small mediastinal lymph nodes, including a dominant 8 mm short axis subcarinal node, within normal limits. 12 mm short axis right hilar node (series 2/image 65), previously 11 mm, grossly unchanged. Visualized thyroid is notable for a 14 mm right thyroid nodule, unchanged. Not clinically significant; no follow-up imaging recommended (ref: J Am Coll Radiol. 2015 Feb;12(2): 143-50). Lungs/Pleura: 2.1 x 4.0 cm posterior right upper lobe mass (series 4/image 54), previously 2.3 x 3.8 cm, grossly unchanged. Additional 6 mm nodule in the anterior right upper lobe (series 4/image 60), 4 x 9 mm nodule in the left upper lobe (series 4/image 9), and 7 mm irregular nodule in the right middle lobe (series 4/image 84), grossly unchanged. Subpleural reticulation/fibrosis in the lungs bilaterally, lower lobe predominant, favoring mild to moderate chronic interstitial lung disease. No focal consolidation. No pleural effusion or pneumothorax. Upper Abdomen: 2.8 x 1.6 cm left adrenal nodule (series 2/image 139), unchanged when measured in a similar fashion. Hepatic steatosis with focal fatty sparing. Vascular calcifications. Musculoskeletal: Stable 15 mm inferior right breast nodule (series 2/image 89). Degenerative changes of the visualized thoracolumbar spine. IMPRESSION: 4.0 cm posterior right upper lobe mass, corresponding to the patient's known primary  bronchogenic neoplasm, grossly unchanged. Additional stable subcentimeter bilateral pulmonary nodules. Stable 12 mm right hilar node. Additional small mediastinal nodes, within normal limits. Stable 2.8 cm left adrenal nodule. Additional stable ancillary findings as above. Aortic Atherosclerosis (ICD10-I70.0). Electronically Signed   By: Julian Hy M.D.   On: 12/29/2019 14:58   MR Brain W Wo Contrast  Result Date: 12/23/2019 CLINICAL DATA:  Brain metastasis. Follow-up of treatment to brain; brain/CNS neoplasm, surveillance. Additional history obtained from [facility] chemotherapy, prior SRS to right parietal lobe lesion July 2020. EXAM: MRI HEAD WITHOUT AND WITH CONTRAST TECHNIQUE: Multiplanar, multiecho pulse sequences of the brain and surrounding structures were obtained without and with intravenous contrast. CONTRAST:  30mL GADAVIST GADOBUTROL 1 MMOL/ML IV SOLN COMPARISON:  Prior brain MRI examinations 09/16/2019 and earlier. FINDINGS: Brain: Multiple sequences are significantly motion degraded, limiting evaluation. The axial SWI sequence and axial T1 weighted postcontrast sequence are most notably affected. Stable, moderate generalized parenchymal atrophy. Unchanged size of a 6 mm enhancing lesion within the right parietal lobe (series 9, image 73) (remeasured on prior). A subtle interval increase in extent of surrounding T2 hyperintensity is questioned. Within the limitations of motion degradation, no new intracranial metastasis is identified. Stable background moderate multifocal T2/FLAIR hyperintensity within the cerebral white matter and pons which is nonspecific, but consistent with chronic small vessel ischemic disease. There is  no acute infarct. No extra-axial fluid collection. No midline shift. Vascular: Expected proximal arterial flow voids. Skull and upper cervical spine: No focal marrow lesion Sinuses/Orbits: Visualized orbits show no acute finding. Mild paranasal  sinus mucosal thickening. Small right maxillary sinus mucous retention cyst. No significant mastoid effusion. IMPRESSION: Motion degraded examination as described. This includes significant motion degradation of the axial T1 weighted postcontrast sequence. Unchanged size of a 6 mm treated enhancing lesion within the right parietal lobe. A subtle interval increase in surrounding T2 hyperintensity is questioned, although this may simply reflect interval progression of treatment related changes. Attention recommended on follow-up. Within the limitations of motion degradation, no new intracranial metastasis is identified. Stable generalized parenchymal atrophy and chronic small vessel ischemic disease. Electronically Signed   By: Kellie Simmering DO   On: 12/23/2019 20:53     ASSESSMENT:  1. Stage IV adenocarcinoma lung, PD-L1 50%, other actionable mutations negative: --Keytruda from 05/17/2016 through 09/24/2018 with mild progression. -4 cycles of carboplatin, pemetrexed from 12/25/2018 through 03/03/2019. -Pemetrexed maintenance from 03/24/2019 through 04/12/2019 discontinued secondary to renal insufficiency. -CT chest with contrast from 09/30/2019 shows right upper lobe lung lesion measuring 3.8 x 2.3)(4.2 x 2.6 cm). No change in the left upper lobe nodule. Subcarinal lymph node measures 11 mm, previously 7 mm. No definite left hilar adenopathy. Stable left adrenal nodule. -CT chest on 12/29/2019 shows 4 cm posterior right upper lobe mass grossly unchanged. Stable 12 mm right hilar node. Stable 2.8 cm left adrenal nodule. -Pembrolizumab held since 12/30/2019 for diarrhea.  Last dose was on 12/09/2019.  2. Brain metastasis: -SRS to the right parietal lobe lesion with surrounding edema on 11/24/2018. -MRI of the brain on 09/16/2019 shows stable treated right parietal metastasis. No new mass or abnormal enhancement. -MRI of the brain on 12/23/2019 shows unchanged 6 mm treated enhancing lesion in the right  parietal lobe. No new lesions.   PLAN:  1. Stage IV adenocarcinoma lung, PD-L1 50%, other actionable mutations negative: -Pembrolizumab was held since 12/30/2019 dose secondary to diarrhea. -Prednisone was started at 60 mg/day on 12/30/2019, decreased to 40 mg daily at last visit. -He reported slight worsening of diarrhea with 2-3 watery stools per day particularly in the mornings. -We will continue to hold pembrolizumab.  2. Brain metastasis: -Does not have any symptoms of recurrence at this time.  3. Itching rash: -Continue moisturizing lotion.  4. Peripheral neuropathy: -Continue gabapentin and duloxetine.  5. Hypomagnesemia: -Decrease magnesium to 1 tablet twice daily.  6. Immunotherapy related colitis: -He is taking 40 mg of prednisone daily. -He started developing 3 watery stools per day since we cut back on the dose. -I will increase the prednisone to 40 mg daily. -He will come back in 1 week for follow-up. -If there is no improvement, will consider infliximab infusion.  Orders placed this encounter:  No orders of the defined types were placed in this encounter.    Derek Jack, MD [place] [phone]   I, Milinda Antis, am acting as a scribe for Dr. Sanda Linger.  I, Derek Jack MD, have reviewed the above documentation for accuracy and completeness, and I agree with the above.

## 2020-01-26 ENCOUNTER — Inpatient Hospital Stay (HOSPITAL_COMMUNITY): Payer: Medicare Other | Admitting: Hematology

## 2020-01-26 ENCOUNTER — Inpatient Hospital Stay (HOSPITAL_COMMUNITY): Payer: Medicare Other

## 2020-01-26 ENCOUNTER — Other Ambulatory Visit: Payer: Self-pay

## 2020-01-26 VITALS — BP 149/68 | HR 98 | Temp 96.9°F | Resp 20 | Wt 220.8 lb

## 2020-01-26 DIAGNOSIS — Z79899 Other long term (current) drug therapy: Secondary | ICD-10-CM | POA: Diagnosis not present

## 2020-01-26 DIAGNOSIS — C3411 Malignant neoplasm of upper lobe, right bronchus or lung: Secondary | ICD-10-CM | POA: Diagnosis not present

## 2020-01-26 DIAGNOSIS — Z5112 Encounter for antineoplastic immunotherapy: Secondary | ICD-10-CM | POA: Diagnosis not present

## 2020-01-26 DIAGNOSIS — C7931 Secondary malignant neoplasm of brain: Secondary | ICD-10-CM | POA: Diagnosis not present

## 2020-01-26 LAB — CBC WITH DIFFERENTIAL/PLATELET
Abs Immature Granulocytes: 0.2 10*3/uL — ABNORMAL HIGH (ref 0.00–0.07)
Basophils Absolute: 0 10*3/uL (ref 0.0–0.1)
Basophils Relative: 0 %
Eosinophils Absolute: 0 10*3/uL (ref 0.0–0.5)
Eosinophils Relative: 0 %
HCT: 48.3 % (ref 39.0–52.0)
Hemoglobin: 15.3 g/dL (ref 13.0–17.0)
Immature Granulocytes: 2 %
Lymphocytes Relative: 7 %
Lymphs Abs: 0.9 10*3/uL (ref 0.7–4.0)
MCH: 30.1 pg (ref 26.0–34.0)
MCHC: 31.7 g/dL (ref 30.0–36.0)
MCV: 94.9 fL (ref 80.0–100.0)
Monocytes Absolute: 0.2 10*3/uL (ref 0.1–1.0)
Monocytes Relative: 2 %
Neutro Abs: 10.5 10*3/uL — ABNORMAL HIGH (ref 1.7–7.7)
Neutrophils Relative %: 89 %
Platelets: 198 10*3/uL (ref 150–400)
RBC: 5.09 MIL/uL (ref 4.22–5.81)
RDW: 15.9 % — ABNORMAL HIGH (ref 11.5–15.5)
WBC: 11.8 10*3/uL — ABNORMAL HIGH (ref 4.0–10.5)
nRBC: 0 % (ref 0.0–0.2)

## 2020-01-26 LAB — COMPREHENSIVE METABOLIC PANEL
ALT: 32 U/L (ref 0–44)
AST: 18 U/L (ref 15–41)
Albumin: 3.5 g/dL (ref 3.5–5.0)
Alkaline Phosphatase: 71 U/L (ref 38–126)
Anion gap: 12 (ref 5–15)
BUN: 33 mg/dL — ABNORMAL HIGH (ref 8–23)
CO2: 22 mmol/L (ref 22–32)
Calcium: 8.3 mg/dL — ABNORMAL LOW (ref 8.9–10.3)
Chloride: 103 mmol/L (ref 98–111)
Creatinine, Ser: 1.81 mg/dL — ABNORMAL HIGH (ref 0.61–1.24)
GFR calc Af Amer: 41 mL/min — ABNORMAL LOW (ref >60)
GFR calc non Af Amer: 36 mL/min — ABNORMAL LOW (ref >60)
Glucose, Bld: 264 mg/dL — ABNORMAL HIGH (ref 70–99)
Potassium: 3.9 mmol/L (ref 3.5–5.1)
Sodium: 137 mmol/L (ref 135–145)
Total Bilirubin: 0.7 mg/dL (ref 0.3–1.2)
Total Protein: 6.2 g/dL — ABNORMAL LOW (ref 6.5–8.1)

## 2020-01-26 LAB — MAGNESIUM: Magnesium: 2.5 mg/dL — ABNORMAL HIGH (ref 1.7–2.4)

## 2020-01-26 MED ORDER — DULOXETINE HCL 60 MG PO CPEP: 60.0000 mg | ORAL_CAPSULE | Freq: Every day | ORAL | 6 refills | Status: AC

## 2020-01-26 NOTE — Progress Notes (Signed)
[facility] [address], [place] [address]   CLINIC:  Medical Oncology/Hematology  PCP:  Celene Squibb, MD [address] Liana Cro[place] [address] [phone]   REASON FOR VISIT:  Follow-up for stage IV adenocarcinoma of right lung  PRIOR THERAPY:  1. Keytruda from 05/17/2016 to 09/24/2018. 2. Carboplatin, pemetrexed x 4 cycles from 12/25/2018 to 03/03/2019. 3. Keytruda x 17 cycles from 12/16/2018 to 12/09/2019.  NGS Results: PD-L1 50%  CURRENT THERAPY: Observation  BRIEF ONCOLOGIC HISTORY:  Oncology History  Primary cancer of right upper lobe of lung ([facility])  05/14/2015 Procedure   Port placement by Dr. Arnoldo Morale   04/01/2016 Initial Diagnosis   Primary cancer of right upper lobe of lung ([facility])   04/02/2016 Procedure   Ultrasound-guided core biopsy performed of a solid 1.5 cm soft tissue nodule in the right chest wall.   04/03/2016 Imaging   MRI brain- No acute and intracranial process or metastasis.  Moderate chronic small vessel ischemic disease.   04/04/2016 Pathology Results   Diagnosis Soft Tissue Needle Core Biopsy, Right Chest Wall SMOOTH MUSCLE NEOPLASM Microscopic Comment The differential diagnosis include low grade leiomyosarcoma. The neoplasm shows spindle cell morphology with rare mitotic figures and minimal atypia, no necrosis present. These cells stains positive for smooth muscle actin, desmin, negative for cytokeratin AE1&3, ck8/18, s100, cd117, cd 34 and cd99. This case also reviewed by Dr. [name] and agree.   04/05/2016 Procedure   CT-guided biopsy of right upper lobe lesion, with tissue specimen sent to pathology, by IR.   04/09/2016 Pathology Results   Lung, needle/core biopsy(ies), Right Upper Lobe - NON SMALL CELL CARCINOMA.   04/17/2016 Pathology Results   PDL1 High Expression.  TPS 50%.   04/24/2016 PET scan   1. Prominently hypermetabolic large right upper lobe mass with metastatic disease to the right  paratracheal and right hilar nodal chains, to the descending mesocolon, to the left upper buttock subcutaneous tissues, to the left upper lobe, to the right subscapularis muscle, and possibly to the left adrenal gland and right breast subcutaneous tissues. Appearance compatible with stage IV lung cancer. 2. There is some focal high activity in the ascending colon which is probably from peristalsis, less likely from a local colon mass. This does raise the possibility that the adjacent mesocolon tumor implant could be due to synchronous colon cancer rather than lung metastasis. 3. Other imaging findings of potential clinical significance: Coronary, aortic arch, and branch vessel atherosclerotic vascular disease. Aortoiliac atherosclerotic vascular disease. Deformity left proximal humerus and left glenohumeral joint from prior trauma. Enlarged prostate gland.   04/26/2016 Pathology Results   FoundationONE: Genomic alterations identified- KRAS S56C, CREBBP splice site [id number], LRP1B S515*, YFV49 S49*, QPR91 splice site [id number], SPTA1 splice site [id number], TP53 C135Y.  Additional findings- MS-Stable, TMB-intermediate (14 Muts/Mb).  No reportable alterations- EGFR, ALK, BRAF, MET, ERBB2, RET, ROS1.   05/17/2016 -  Chemotherapy   The patient had ONDANSETRON IVPB [facility] +/- DEXAMETHASONE, , Intravenous,  Once, 0 of 1 cycle  pembrolizumab (KEYTRUDA) 200 mg in sodium chloride 0.9 % 50 mL chemo infusion, 200 mg, Intravenous, Once, 0 of 5 cycles  for chemotherapy treatment.     08/06/2016 PET scan   IMPRESSION: 1. Overall considerable improvement. The right upper lobe mass is markedly reduced in volume and SUV. Thoracic adenopathy have resolved and the hypermetabolic lesion in the right subscapularis muscle has also resolved. 2. The left upper lobe nodule is no longer appreciably hypermetabolic,  and is highly indistinct although this may be due to motion artifact. Faint in indistinct  nodularity elsewhere in the lungs likewise not currently hypermetabolic. 3. There is some very faint residual low-grade activity along the subcutaneous deposit along the upper left buttock region. Marked reduction in size and activity of the tumor deposit adjacent to the descending colon. 4. Prior right pleural effusion has resolved. 5. Stable appearance of the adrenal glands, with low-level activity and a nodule in the left adrenal gland. 6. Stable small nodule in the right breast, low-grade metabolic activity with maximum SUV 2.2 (formerly 3.0) 7. Other imaging findings of potential clinical significance: Severe arthropathy of the left glenohumeral joint. Coronary, aortic arch, and branch vessel atherosclerotic vascular disease. Aortoiliac atherosclerotic vascular disease. Prominent prostate gland indents the bladder base.    12/16/2018 -  Chemotherapy   The patient had palonosetron (ALOXI) injection 0.25 mg, 0.25 mg, Intravenous,  Once, 4 of 4 cycles Administration: 0.25 mg (12/16/2018), 0.25 mg (01/27/2019), 0.25 mg (03/03/2019), 0.25 mg (01/06/2019) pegfilgrastim (NEULASTA) injection 6 mg, 6 mg, Subcutaneous, Once, 2 of 2 cycles Administration: 6 mg (01/08/2019) pegfilgrastim (NEULASTA ONPRO KIT) injection 6 mg, 6 mg, Subcutaneous, Once, 2 of 2 cycles pegfilgrastim-cbqv (UDENYCA) injection 6 mg, 6 mg, Subcutaneous, Once, 2 of 2 cycles Administration: 6 mg (01/29/2019), 6 mg (03/05/2019) ondansetron (ZOFRAN) 8 mg in sodium chloride 0.9 % 50 mL IVPB, 8 mg (100 % of original dose 8 mg), Intravenous,  Once, 3 of 3 cycles Dose modification: 8 mg (original dose 8 mg, Cycle 5) Administration: 8 mg (03/24/2019), 8 mg (04/16/2019) PEMEtrexed (ALIMTA) 1,100 mg in sodium chloride 0.9 % 100 mL chemo infusion, 490 mg/m2 = 1,125 mg, Intravenous,  Once, 6 of 6 cycles Dose modification: 400 mg/m2 (80 % of original dose 500 mg/m2, Cycle 3, Reason: Other (see comments), Comment: rash,  swelling) Administration: 1,100 mg (12/16/2018), 900 mg (01/27/2019), 900 mg (03/03/2019), 900 mg (03/24/2019), 900 mg (04/16/2019), 1,100 mg (01/06/2019) CARBOplatin (PARAPLATIN) 500 mg in sodium chloride 0.9 % 250 mL chemo infusion, 530 mg (110.7 % of original dose 477.5 mg), Intravenous,  Once, 4 of 4 cycles Dose modification:   (original dose 477.5 mg, Cycle 1),   (original dose 485.5 mg, Cycle 3),   (original dose 494 mg, Cycle 4),   (original dose 494 mg, Cycle 2) Administration: 500 mg (12/16/2018), 490 mg (01/27/2019), 490 mg (03/03/2019), 490 mg (01/06/2019) pembrolizumab (KEYTRUDA) 200 mg in sodium chloride 0.9 % 50 mL chemo infusion, 2 mg/kg = 200 mg, Intravenous, Once, 12 of 13 cycles Dose modification: 200 mg (original dose 2 mg/kg, Cycle 14, Reason: Provider Judgment) Administration: 200 mg (04/16/2019), 200 mg (05/31/2019), 200 mg (06/22/2019), 200 mg (07/13/2019), 200 mg (05/10/2019), 200 mg (08/03/2019), 200 mg (08/24/2019), 200 mg (09/14/2019), 200 mg (10/08/2019), 200 mg (10/29/2019), 200 mg (11/18/2019), 200 mg (12/09/2019) fosaprepitant (EMEND) 150 mg, dexamethasone (DECADRON) 12 mg in sodium chloride 0.9 % 145 mL IVPB, , Intravenous,  Once, 4 of 4 cycles Administration:  (12/16/2018),  (01/27/2019),  (03/03/2019),  (01/06/2019)  for chemotherapy treatment.    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  Two adjacent large masslike areas of consolidation within the right upper lobe with differential considerations including malignancy or pneumonia. Multiple enlarged right hilar and mediastinal lymph nodes which may be reactive or metastatic in etiology.  Additional indeterminate pulmonary nodules as above. Recommend attention on  follow-up.  Indeterminate 2.9 cm left adrenal nodule. This needs dedicated evaluation with pre and post contrast-enhanced CT or MRI  after confirmation of pulmonary process.  Indeterminate nodule within the right breast. Recommend dedicated evaluation with mammography.  Hepatic steatosis.   04/02/2016 Procedure   Ultrasound-guided core biopsy performed of a solid 1.5 cm soft tissue nodule in the right chest wall.   04/04/2016 Pathology Results   Diagnosis Soft Tissue Needle Core Biopsy, Right Chest Wall SMOOTH MUSCLE NEOPLASM Microscopic Comment The differential diagnosis include low grade leiomyosarcoma. The neoplasm shows spindle cell morphology with rare mitotic figures and minimal atypia, no necrosis present. These cells stains positive for smooth muscle actin, desmin, negative for cytokeratin AE1&3, ck8/18, s100, cd117, cd 34 and cd99. This case also reviewed by Dr. [name] and agree.      CANCER STAGING: Cancer Staging Primary cancer of right upper lobe of lung ([facility]) Staging form: Lung, AJCC 8th Edition - Clinical: Stage IVB (cT3, cN2, cM1c) - Signed by Baird Cancer, PA-C on 05/07/2016   INTERVAL HISTORY:  Mr. ALMIN LIVINGSTONE, a 74 y.o. male, returns for routine follow-up of his stage IV adenocarcinoma of right lung. Jarmar was last seen on 01/20/2020.  Today he reports that he has been having diarrhea 2-3 times per day and did not notice any difference with taking 50 mg of prednisone. He denies having abdominal pain and his appetite is moderately decreased.   REVIEW OF SYSTEMS:  Review of Systems  Constitutional: Positive for appetite change (moderately decreased) and fatigue (severe).  Gastrointestinal: Positive for diarrhea (2-3 times daily).  Neurological: Positive for headaches (occasional) and numbness (hands & feet).  All other systems reviewed and are negative.   PAST MEDICAL/SURGICAL HISTORY:  Past Medical History:  Diagnosis Date  . Adrenal mass, left ([place]) 04/01/2016  . Diabetes mellitus ([facility]) 05/02/2016  . Diabetes mellitus without complication ([facility])   . Hypertension   .  Mass of breast, right 04/01/2016  . Mass of upper lobe of right lung 04/01/2016   2 adjacent, 5 cm masses, hilar adenopathy  . Primary cancer of right upper lobe of lung ([facility]) 04/01/2016   2 adjacent, 5 cm masses, hilar adenopathy  . Pulmonary embolus ([facility]) 03/31/2016  . 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 COLONOSCOPY N/A 07/28/2017   Procedure: COLONOSCOPY;  Surgeon: Rogene Houston, MD;  Location: [facility] ENDO SUITE;  Service: Endoscopy;  Laterality: N/A;  205  . PORTACATH PLACEMENT Left 05/13/2016   Procedure: INSERTION PORT-A-CATH;  Surgeon: Aviva Signs, MD;  Location: [facility] ORS;  Service: General;  Laterality: Left;    SOCIAL HISTORY:  Social History   Socioeconomic History  . Marital status: Divorced    Spouse name: Not on file  . Number of children: Not on file  . Years of education: Not on file  . Highest education level: Not on file  Occupational History  . Not on file  Tobacco Use  . Smoking status: Former Smoker    Years: 61.00    Quit date: 04/11/2014    Years since quitting: 5.7  . Smokeless tobacco: Never Used  . Tobacco comment: patient does vape smoking x 2 years  Vaping Use  . Vaping Use: Every day  Substance and Sexual Activity  . Alcohol use: Not Currently    Comment: quit 10 years  . Drug use: No  . Sexual activity: Not on file  Other Topics Concern  . Not  on file  Social History Narrative  . Not on file   Social Determinants of Health   Financial Resource Strain:   . Difficulty of Paying Living Expenses: Not on file  Food Insecurity:   . Worried About Charity fundraiser in the Last Year: Not on file  . Ran Out of Food in the Last Year: Not on file  Transportation Needs:   . Lack of Transportation (Medical): Not on file  . Lack of Transportation (Non-Medical): Not on file  Physical Activity:   . Days of Exercise per Week: Not on file  . Minutes of Exercise per Session: Not on file  Stress:   .  Feeling of Stress : Not on file  Social Connections:   . Frequency of Communication with Friends and Family: Not on file  . Frequency of Social Gatherings with Friends and Family: Not on file  . Attends Religious Services: Not on file  . Active Member of Clubs or Organizations: Not on file  . Attends Archivist Meetings: Not on file  . Marital Status: Not on file  Intimate Partner Violence:   . Fear of Current or Ex-Partner: Not on file  . Emotionally Abused: Not on file  . Physically Abused: Not on file  . Sexually Abused: Not on file    FAMILY HISTORY:  Family History  Problem Relation Age of Onset  . Stroke Mother   . Heart attack Father   . Heart attack Brother     CURRENT MEDICATIONS:  Current Outpatient Medications  Medication Sig Dispense Refill  . ALPRAZolam (XANAX) 1 MG tablet Take 1 mg by mouth 2 (two) times daily as needed for anxiety.    Marland Kitchen amLODipine (NORVASC) 2.5 MG tablet Take 2.5 mg by mouth daily.  4  . diphenoxylate-atropine (LOMOTIL) 2.5-0.025 MG tablet Take 2 tablets at first watery stool then 1 tablet with each watery stool. 60 tablet 2  . DULoxetine (CYMBALTA) 60 MG capsule Take 60 mg by mouth daily.    . famotidine (PEPCID) 20 MG tablet Take 1 tablet (20 mg total) by mouth daily. 30 tablet 2  . folic acid (FOLVITE) 1 MG tablet TAKE ONE TABLET BY MOUTH ONCE DAILY. START 5-7 DAYS BEFORE ALIMTA CHEMOTHERAPY AND CONTINUE FOR 21 DAYS AFTER CHEMOTHERAPY. 100 tablet 0  . furosemide (LASIX) 20 MG tablet Take 2 tablets (40 mg total) by mouth daily. 20 tablet 0  . gabapentin (NEURONTIN) 400 MG capsule Take 1 capsule (400 mg total) by mouth 3 (three) times daily. 90 capsule 3  . GLIPIZIDE XL 5 MG 24 hr tablet Take 5 mg by mouth daily.  4  . hydrocortisone 2.5 % lotion Apply topically as needed.     . lactulose (CHRONULAC) 10 GM/15ML solution Take 30 mLs (20 g total) by mouth at bedtime as needed for mild constipation. 480 mL 2  . levocetirizine (XYZAL) 5 MG  tablet Take 5 mg by mouth at bedtime.    . lidocaine-prilocaine (EMLA) cream APPLY A QUARTER SIZE AMOUNT TO THE AFFECTED AREA 1 HOUR PRIOR TO COMING TO CHEMOTHERAPY. COVER WITH A PLASTIC WRAP. 30 g 0  . magnesium oxide (MAG-OX) 400 (241.3 Mg) MG tablet Take 1 tablet (400 mg total) by mouth 2 (two) times daily. 60 tablet 2  . metFORMIN (GLUCOPHAGE) 500 MG tablet Take 500 mg by mouth 2 (two) times daily.  4  . metoprolol succinate (TOPROL-XL) 25 MG 24 hr tablet TAKE ONE TABLET BY MOUTH IN THE EVENING. [place]  tablet 0  . ondansetron (ZOFRAN) 4 MG tablet Take 1 tablet (4 mg total) by mouth every 8 (eight) hours as needed for nausea or vomiting. 4 tablet 0  . oxyCODONE-acetaminophen (PERCOCET) 10-325 MG tablet Take 1 tablet by mouth as needed.   0  . Pembrolizumab (KEYTRUDA IV) Inject into the vein.    . potassium chloride (K-DUR,KLOR-CON) 10 MEQ tablet Take 10 mEq by mouth daily.   0  . predniSONE (DELTASONE) 20 MG tablet Take 2.5 tablets (50 mg total) by mouth daily with breakfast. 70 tablet 0  . prochlorperazine (COMPAZINE) 10 MG tablet Take 1 tablet (10 mg total) by mouth every 6 (six) hours as needed (Nausea or vomiting). 30 tablet 1  . simvastatin (ZOCOR) 20 MG tablet Take 20 mg by mouth every evening.  5  . torsemide (DEMADEX) 20 MG tablet Take 1 tablet (20 mg total) by mouth daily. 30 tablet 1   No current facility-administered medications for this visit.    ALLERGIES:  No Known Allergies  PHYSICAL EXAM:  Performance status (ECOG): 1 - Symptomatic but completely ambulatory  Vitals:   01/26/20 1449  BP: (!) 149/68  Pulse: 98  Resp: 20  Temp: (!) 96.9 F (36.1 C)  SpO2: 98%   Wt Readings from Last 3 Encounters:  01/26/20 220 lb 12.8 oz (100.2 kg)  01/20/20 221 lb 1.6 oz (100.3 kg)  01/13/20 219 lb (99.3 kg)   Physical Exam Vitals reviewed.  Constitutional:      Appearance: Normal appearance. He is obese.  Cardiovascular:     Rate and Rhythm: Normal rate and regular rhythm.      Pulses: Normal pulses.     Heart sounds: Normal heart sounds.  Pulmonary:     Effort: Pulmonary effort is normal.     Breath sounds: Normal breath sounds.  Abdominal:     General: There is distension.     Palpations: Abdomen is soft. There is no mass.     Tenderness: There is no abdominal tenderness.  Musculoskeletal:     Right lower leg: No edema.     Left lower leg: No edema.  Neurological:     General: No focal deficit present.     Mental Status: He is alert and oriented to person, place, and time.  Psychiatric:        Mood and Affect: Mood normal.        Behavior: Behavior normal.      LABORATORY DATA:  I have reviewed the labs as listed.  CBC Latest Ref Rng & Units 01/26/2020 01/20/2020 01/13/2020  WBC 4.0 - 10.5 K/uL 11.8(H) 12.1(H) 16.4(H)  Hemoglobin 13.0 - 17.0 g/dL 15.3 14.6 14.5  Hematocrit 39 - 52 % 48.3 46.5 45.9  Platelets 150 - 400 K/uL 198 196 223   CMP Latest Ref Rng & Units 01/26/2020 01/20/2020 01/13/2020  Glucose 70 - 99 mg/dL 264(H) 200(H) 165(H)  BUN 8 - 23 mg/dL 33(H) 30(H) 42(H)  Creatinine 0.61 - 1.24 mg/dL 1.81(H) 1.67(H) 1.90(H)  Sodium 135 - 145 mmol/L 137 136 141  Potassium 3.5 - 5.1 mmol/L 3.9 4.5 4.1  Chloride 98 - 111 mmol/L 103 101 100  CO2 22 - 32 mmol/L $RemoveB'22 25 29  'dDQuYbqL$ Calcium 8.9 - 10.3 mg/dL 8.3(L) 8.7(L) 9.0  Total Protein 6.5 - 8.1 g/dL 6.2(L) 6.7 6.3(L)  Total Bilirubin 0.3 - 1.2 mg/dL 0.7 0.6 0.7  Alkaline Phos 38 - 126 U/L 71 67 57  AST 15 - 41 U/L 18 16 20  ALT 0 - 44 U/L 32 22 37    DIAGNOSTIC IMAGING:  I have independently reviewed the scans and discussed with the patient. CT Chest W Contrast  Result Date: 12/29/2019 CLINICAL DATA:  Right lung cancer, status post chemotherapy EXAM: CT CHEST WITH CONTRAST TECHNIQUE: Multidetector CT imaging of the chest was performed during intravenous contrast administration. CONTRAST:  3mL OMNIPAQUE IOHEXOL 300 MG/ML  SOLN COMPARISON:  09/30/2019 FINDINGS: Cardiovascular: The heart is top-normal in  size. No pericardial effusion. No evidence of thoracic aortic aneurysm. Atherosclerotic calcifications of the aortic arch. Three vessel coronary atherosclerosis. Left chest port terminates in the proximal SVC. Mediastinum/Nodes: Small mediastinal lymph nodes, including a dominant 8 mm short axis subcarinal node, within normal limits. 12 mm short axis right hilar node (series 2/image 65), previously 11 mm, grossly unchanged. Visualized thyroid is notable for a 14 mm right thyroid nodule, unchanged. Not clinically significant; no follow-up imaging recommended (ref: J Am Coll Radiol. 2015 Feb;12(2): 143-50). Lungs/Pleura: 2.1 x 4.0 cm posterior right upper lobe mass (series 4/image 54), previously 2.3 x 3.8 cm, grossly unchanged. Additional 6 mm nodule in the anterior right upper lobe (series 4/image 60), 4 x 9 mm nodule in the left upper lobe (series 4/image 9), and 7 mm irregular nodule in the right middle lobe (series 4/image 84), grossly unchanged. Subpleural reticulation/fibrosis in the lungs bilaterally, lower lobe predominant, favoring mild to moderate chronic interstitial lung disease. No focal consolidation. No pleural effusion or pneumothorax. Upper Abdomen: 2.8 x 1.6 cm left adrenal nodule (series 2/image 139), unchanged when measured in a similar fashion. Hepatic steatosis with focal fatty sparing. Vascular calcifications. Musculoskeletal: Stable 15 mm inferior right breast nodule (series 2/image 89). Degenerative changes of the visualized thoracolumbar spine. IMPRESSION: 4.0 cm posterior right upper lobe mass, corresponding to the patient's known primary bronchogenic neoplasm, grossly unchanged. Additional stable subcentimeter bilateral pulmonary nodules. Stable 12 mm right hilar node. Additional small mediastinal nodes, within normal limits. Stable 2.8 cm left adrenal nodule. Additional stable ancillary findings as above. Aortic Atherosclerosis (ICD10-I70.0). Electronically Signed   By: Julian Hy  M.D.   On: 12/29/2019 14:58     ASSESSMENT:  1. Stage IV adenocarcinoma lung, PD-L1 50%, other actionable mutations negative: --Keytruda from 05/17/2016 through 09/24/2018 with mild progression. -4 cycles of carboplatin, pemetrexed from 12/25/2018 through 03/03/2019. -Pemetrexed maintenance from 03/24/2019 through 04/12/2019 discontinued secondary to renal insufficiency. -CT chest with contrast from 09/30/2019 shows right upper lobe lung lesion measuring 3.8 x 2.3)(4.2 x 2.6 cm). No change in the left upper lobe nodule. Subcarinal lymph node measures 11 mm, previously 7 mm. No definite left hilar adenopathy. Stable left adrenal nodule. -CT chest on 12/29/2019 shows 4 cm posterior right upper lobe mass grossly unchanged. Stable 12 mm right hilar node. Stable 2.8 cm left adrenal nodule. -Pembrolizumab held since 12/30/2019 for diarrhea.  Last dose was on 12/09/2019.  2. Brain metastasis: -SRS to the right parietal lobe lesion with surrounding edema on 11/24/2018. -MRI of the brain on 09/16/2019 shows stable treated right parietal metastasis. No new mass or abnormal enhancement. -MRI of the brain on 12/23/2019 shows unchanged 6 mm treated enhancing lesion in the right parietal lobe. No new lesions.   PLAN:  1. Stage IV adenocarcinoma lung, PD-L1 50%, other actionable mutations negative: -Pembrolizumab has been on hold since 12/30/2019 secondary to immunotherapy induced diarrhea. -He is currently on prednisone.  We will plan to start him on Remicade. -Return to clinic in 1 week.  2. Brain metastasis: -Does  not have any symptoms of recurrence at this time.  3.  CKD: -Creatinine is 1.81 and at baseline.  BUN elevated secondary to prednisone.  4. Peripheral neuropathy: -Continue gabapentin and duloxetine.  5. Hypomagnesemia: -Continue magnesium 1 tablet twice daily.  6. Immunotherapy related colitis: -We will increase prednisone to 50 mg daily last week. -However he did not see  any improvement in diarrhea. -I have recommended Remicade 5 mg/kg infusion.  We discussed side effects.  We will plan to repeat it in 6 to 8 weeks if there is no improvement. -We will start tapering down prednisone.  I will cut back prednisone to 40 mg daily. -We will reevaluate him in 1 week.   Orders placed this encounter:  No orders of the defined types were placed in this encounter.    Derek Jack, MD [place] [phone]   I, Milinda Antis, am acting as a scribe for Dr. Sanda Linger.  I, Derek Jack MD, have reviewed the above documentation for accuracy and completeness, and I agree with the above.

## 2020-01-26 NOTE — Patient Instructions (Signed)
[place] at [facility] Discharge Instructions  You were seen today by Dr. Delton Coombes. He went over your recent results. Your diarrhea will be treated tomorrow with Remicade, which is given through IV. Start tapering the prednisone and take 2 tablets daily for 1 week, then take 1.5 tablets daily for 1 week, then take 1 tablet daily for 1 week, then take 0.5 tablet daily for 1 week, then stop taking prednisone. Dr. Delton Coombes will see you back in 1 week for labs and follow up.   Thank you for choosing [place] at [facility] to provide your oncology and hematology care.  To afford each patient quality time with our provider, please arrive at least 15 minutes before your scheduled appointment time.   If you have a lab appointment with the [facility] please come in thru the Main Entrance and check in at the main information desk  You need to re-schedule your appointment should you arrive 10 or more minutes late.  We strive to give you quality time with our providers, and arriving late affects you and other patients whose appointments are after yours.  Also, if you no show three or more times for appointments you may be dismissed from the clinic at the providers discretion.     Again, thank you for choosing [facility].  Our hope is that these requests will decrease the amount of time that you wait before being seen by our physicians.       _____________________________________________________________  Should you have questions after your visit to [facility], please contact our office at [phone] between the hours of 8:00 a.m. and 4:30 p.m.  Voicemails left after 4:00 p.m. will not be returned until the following business day.  For prescription refill requests, have your pharmacy contact our office and allow 72 hours.    Cancer Center Support Programs:   > Cancer Support Group  2nd Tuesday of the month 1pm-2pm,  Journey Room

## 2020-01-27 ENCOUNTER — Other Ambulatory Visit (HOSPITAL_COMMUNITY): Payer: Self-pay | Admitting: *Deleted

## 2020-01-27 ENCOUNTER — Encounter (HOSPITAL_COMMUNITY)
Admission: RE | Admit: 2020-01-27 | Discharge: 2020-01-27 | Disposition: A | Discharge status: Home / Self Care | Payer: Medicare Other | Source: Ambulatory Visit | Attending: Hematology | Admitting: Hematology

## 2020-01-27 DIAGNOSIS — T380X5A Adverse effect of glucocorticoids and synthetic analogues, initial encounter: Secondary | ICD-10-CM | POA: Diagnosis not present

## 2020-01-27 DIAGNOSIS — K521 Toxic gastroenteritis and colitis: Secondary | ICD-10-CM | POA: Diagnosis not present

## 2020-01-27 MED ORDER — DIPHENHYDRAMINE HCL 50 MG/ML IJ SOLN
25.0000 mg | Freq: Once | INTRAMUSCULAR | Status: AC
  Administered 2020-01-27: 25 mg via INTRAVENOUS

## 2020-01-27 MED ORDER — SODIUM CHLORIDE 0.9 % IV SOLN: 5.0000 mg/kg | Freq: Once | INTRAVENOUS | Status: DC

## 2020-01-27 MED ORDER — SODIUM CHLORIDE 0.9 % IV SOLN: Freq: Once | INTRAVENOUS | Status: AC

## 2020-01-27 MED ORDER — SODIUM CHLORIDE 0.9 % IV SOLN
5.0000 mg/kg | Freq: Once | INTRAVENOUS | Status: AC
  Administered 2020-01-27: 500 mg via INTRAVENOUS
  Filled 2020-01-27: qty 50

## 2020-01-27 MED ORDER — ACETAMINOPHEN 325 MG PO TABS
650.0000 mg | ORAL_TABLET | Freq: Once | ORAL | Status: AC
  Administered 2020-01-27: 650 mg via ORAL

## 2020-01-27 MED ORDER — HEPARIN SOD (PORK) LOCK FLUSH 100 UNIT/ML IV SOLN
500.0000 [IU] | Freq: Once | INTRAVENOUS | Status: AC
  Administered 2020-01-27: 500 [IU] via INTRAVENOUS

## 2020-01-27 MED ORDER — HEPARIN SOD (PORK) LOCK FLUSH 100 UNIT/ML IV SOLN
INTRAVENOUS | Status: AC
  Filled 2020-01-27: qty 5

## 2020-01-27 NOTE — Discharge Instructions (Signed)
Infliximab injection What is this medicine? INFLIXIMAB (in FLIX i mab) is used to treat Crohn's disease and ulcerative colitis. It is also used to treat ankylosing spondylitis, plaque psoriasis, and some forms of arthritis. This medicine may be used for other purposes; ask your health care provider or pharmacist if you have questions. COMMON BRAND NAME(S): AVSOLA, INFLECTRA, Remicade, RENFLEXIS What should I tell my health care provider before I take this medicine? They need to know if you have any of these conditions:  cancer  current or past resident of [place]  diabetes  exposure to tuberculosis  Guillain-Barre syndrome  heart failure  hepatitis or liver disease  immune system problems  infection  lung or breathing disease, like COPD  multiple sclerosis  receiving phototherapy for the skin  seizure disorder  an unusual or allergic reaction to infliximab, mouse proteins, other medicines, foods, dyes, or preservatives  pregnant or trying to get pregnant  breast-feeding How should I use this medicine? This medicine is for injection into a vein. It is usually given by a health care professional in a hospital or clinic setting. A special MedGuide will be given to you by the pharmacist with each prescription and refill. Be sure to read this information carefully each time. Talk to your pediatrician regarding the use of this medicine in children. While this drug may be prescribed for children as young as 6 years of age for selected conditions, precautions do apply. Overdosage: If you think you have taken too much of this medicine contact a poison control center or emergency room at once. NOTE: This medicine is only for you. Do not share this medicine with others. What if I miss a dose? It is important not to miss your dose. Call your doctor or health care professional if you are unable to keep an appointment. What may interact with this  medicine? Do not take this medicine with any of the following medications:  biologic medicines such as abatacept, adalimumab, anakinra, certolizumab, etanercept, golimumab, rituximab, secukinumab, tocilizumab, tofactinib, ustekinumab  live vaccines This list may not describe all possible interactions. Give your health care provider a list of all the medicines, herbs, non-prescription drugs, or dietary supplements you use. Also tell them if you smoke, drink alcohol, or use illegal drugs. Some items may interact with your medicine. What should I watch for while using this medicine? Your condition will be monitored carefully while you are receiving this medicine. Visit your doctor or health care professional for regular checks on your progress. You may need blood work done while you are taking this medicine. Before beginning therapy, your doctor may do a test to see if you have been exposed to tuberculosis. Call your doctor or health care professional for advice if you get a fever, chills or sore throat, or other symptoms of a cold or flu. Do not treat yourself. This drug decreases your body's ability to fight infections. Try to avoid being around people who are sick. This medicine may make the symptoms of heart failure worse in some patients. If you notice symptoms such as increased shortness of breath or swelling of the ankles or legs, contact your health care provider right away. If you are going to have surgery or dental work, tell your health care professional or dentist that you have received this medicine. If you take this medicine for plaque psoriasis, stay out of the sun. If you cannot avoid being in the sun, wear protective clothing and use sunscreen. Do   not use sun lamps or tanning beds/booths. Talk to your doctor about your risk of cancer. You may be more at risk for certain types of cancers if you take this medicine. What side effects may I notice from receiving this medicine? Side effects  that you should report to your doctor or health care professional as soon as possible:  allergic reactions like skin rash, itching or hives, swelling of the face, lips, or tongue  breathing problems  changes in vision  chest pain  fever or chills, usually related to the infusion  joint pain  pain, tingling, numbness in the hands or feet  redness, blistering, peeling or loosening of the skin, including inside the mouth  seizures  signs of infection - fever or chills, cough, sore throat, flu-like symptoms, pain or difficulty passing urine  signs and symptoms of liver injury like dark yellow or brown urine; general ill feeling; light-colored stools; loss of appetite; nausea; right upper belly pain; unusually weak or tired; yellowing of the eyes or skin  signs and symptoms of a stroke like changes in vision; confusion; trouble speaking or understanding; severe headaches; sudden numbness or weakness of the face, arm or leg; trouble walking; dizziness; loss of balance or coordination  swelling of the ankles, feet, or hands  swollen lymph nodes in the neck, underarm, or groin areas  unusual bleeding or bruising  unusually weak or tired Side effects that usually do not require medical attention (report to your doctor or health care professional if they continue or are bothersome):  headache  nausea  stomach pain  upset stomach This list may not describe all possible side effects. Call your doctor for medical advice about side effects. You may report side effects to FDA at 1-800-FDA-1088. Where should I keep my medicine? This drug is given in a hospital or clinic and will not be stored at home. NOTE: This sheet is a summary. It may not cover all possible information. If you have questions about this medicine, talk to your doctor, pharmacist, or health care provider.  2020 Elsevier/Gold Standard (2016-05-22 13:45:32)  

## 2020-01-27 NOTE — Progress Notes (Signed)
Orders placed for remicade per Dr. Delton Coombes

## 2020-01-31 DIAGNOSIS — E1165 Type 2 diabetes mellitus with hyperglycemia: Secondary | ICD-10-CM | POA: Diagnosis not present

## 2020-01-31 DIAGNOSIS — E782 Mixed hyperlipidemia: Secondary | ICD-10-CM | POA: Diagnosis not present

## 2020-01-31 DIAGNOSIS — C3411 Malignant neoplasm of upper lobe, right bronchus or lung: Secondary | ICD-10-CM | POA: Diagnosis not present

## 2020-01-31 DIAGNOSIS — I10 Essential (primary) hypertension: Secondary | ICD-10-CM | POA: Diagnosis not present

## 2020-02-02 ENCOUNTER — Inpatient Hospital Stay (HOSPITAL_BASED_OUTPATIENT_CLINIC_OR_DEPARTMENT_OTHER): Payer: Medicare Other | Admitting: Hematology

## 2020-02-02 ENCOUNTER — Other Ambulatory Visit: Payer: Self-pay

## 2020-02-02 ENCOUNTER — Inpatient Hospital Stay (HOSPITAL_COMMUNITY): Payer: Medicare Other

## 2020-02-02 VITALS — BP 153/84 | HR 101 | Resp 16 | Wt 226.0 lb

## 2020-02-02 DIAGNOSIS — C3411 Malignant neoplasm of upper lobe, right bronchus or lung: Secondary | ICD-10-CM

## 2020-02-02 DIAGNOSIS — E1165 Type 2 diabetes mellitus with hyperglycemia: Secondary | ICD-10-CM | POA: Diagnosis not present

## 2020-02-02 DIAGNOSIS — I10 Essential (primary) hypertension: Secondary | ICD-10-CM | POA: Diagnosis not present

## 2020-02-02 DIAGNOSIS — Z5112 Encounter for antineoplastic immunotherapy: Secondary | ICD-10-CM | POA: Diagnosis not present

## 2020-02-02 DIAGNOSIS — C7931 Secondary malignant neoplasm of brain: Secondary | ICD-10-CM | POA: Diagnosis not present

## 2020-02-02 DIAGNOSIS — Z79899 Other long term (current) drug therapy: Secondary | ICD-10-CM | POA: Diagnosis not present

## 2020-02-02 DIAGNOSIS — E7849 Other hyperlipidemia: Secondary | ICD-10-CM | POA: Diagnosis not present

## 2020-02-02 LAB — COMPREHENSIVE METABOLIC PANEL
ALT: 27 U/L (ref 0–44)
AST: 19 U/L (ref 15–41)
Albumin: 3.7 g/dL (ref 3.5–5.0)
Alkaline Phosphatase: 58 U/L (ref 38–126)
Anion gap: 11 (ref 5–15)
BUN: 22 mg/dL (ref 8–23)
CO2: 26 mmol/L (ref 22–32)
Calcium: 8.5 mg/dL — ABNORMAL LOW (ref 8.9–10.3)
Chloride: 102 mmol/L (ref 98–111)
Creatinine, Ser: 1.49 mg/dL — ABNORMAL HIGH (ref 0.61–1.24)
GFR calc Af Amer: 52 mL/min — ABNORMAL LOW (ref >60)
GFR calc non Af Amer: 45 mL/min — ABNORMAL LOW (ref >60)
Glucose, Bld: 225 mg/dL — ABNORMAL HIGH (ref 70–99)
Potassium: 3.8 mmol/L (ref 3.5–5.1)
Sodium: 139 mmol/L (ref 135–145)
Total Bilirubin: 0.8 mg/dL (ref 0.3–1.2)
Total Protein: 6 g/dL — ABNORMAL LOW (ref 6.5–8.1)

## 2020-02-02 LAB — CBC WITH DIFFERENTIAL/PLATELET
Abs Immature Granulocytes: 0.16 10*3/uL — ABNORMAL HIGH (ref 0.00–0.07)
Basophils Absolute: 0 10*3/uL (ref 0.0–0.1)
Basophils Relative: 0 %
Eosinophils Absolute: 0 10*3/uL (ref 0.0–0.5)
Eosinophils Relative: 0 %
HCT: 45.6 % (ref 39.0–52.0)
Hemoglobin: 14.6 g/dL (ref 13.0–17.0)
Immature Granulocytes: 2 %
Lymphocytes Relative: 17 %
Lymphs Abs: 1.8 10*3/uL (ref 0.7–4.0)
MCH: 30.3 pg (ref 26.0–34.0)
MCHC: 32 g/dL (ref 30.0–36.0)
MCV: 94.6 fL (ref 80.0–100.0)
Monocytes Absolute: 0.3 10*3/uL (ref 0.1–1.0)
Monocytes Relative: 2 %
Neutro Abs: 8.6 10*3/uL — ABNORMAL HIGH (ref 1.7–7.7)
Neutrophils Relative %: 79 %
Platelets: 179 10*3/uL (ref 150–400)
RBC: 4.82 MIL/uL (ref 4.22–5.81)
RDW: 16.1 % — ABNORMAL HIGH (ref 11.5–15.5)
WBC: 10.9 10*3/uL — ABNORMAL HIGH (ref 4.0–10.5)
nRBC: 0 % (ref 0.0–0.2)

## 2020-02-02 LAB — MAGNESIUM: Magnesium: 2.3 mg/dL (ref 1.7–2.4)

## 2020-02-02 NOTE — Patient Instructions (Signed)
[place] at [facility] Discharge Instructions  You were seen today by Dr. Delton Coombes. He went over your recent results. Continue taking 1 tablet of prednisone until Sunday, 10/3. Then start taking 1/2 tablet of prednisone since Monday, 10/4. Dr. Delton Coombes will see you back in 2 weeks for labs and follow up.   Thank you for choosing [place] at [facility] to provide your oncology and hematology care.  To afford each patient quality time with our provider, please arrive at least 15 minutes before your scheduled appointment time.   If you have a lab appointment with the [facility] please come in thru the Main Entrance and check in at the main information desk  You need to re-schedule your appointment should you arrive 10 or more minutes late.  We strive to give you quality time with our providers, and arriving late affects you and other patients whose appointments are after yours.  Also, if you no show three or more times for appointments you may be dismissed from the clinic at the providers discretion.     Again, thank you for choosing [facility].  Our hope is that these requests will decrease the amount of time that you wait before being seen by our physicians.       _____________________________________________________________  Should you have questions after your visit to [facility], please contact our office at [phone] between the hours of 8:00 a.m. and 4:30 p.m.  Voicemails left after 4:00 p.m. will not be returned until the following business day.  For prescription refill requests, have your pharmacy contact our office and allow 72 hours.    Cancer Center Support Programs:   > Cancer Support Group  2nd Tuesday of the month 1pm-2pm, Journey Room

## 2020-02-02 NOTE — Progress Notes (Signed)
[facility] [address], [place] [address]   CLINIC:  Medical Oncology/Hematology  PCP:  Celene Squibb, MD [address] Liana Crocker [place] [address] [phone]   REASON FOR VISIT:  Follow-up for stage IV adenocarcinoma of right lung  PRIOR THERAPY:  1. Keytruda from 05/17/2016 to 09/24/2018. 2. Carboplatin, pemetrexed x 4 cycles from 12/25/2018 to 03/03/2019. 3. Keytruda x 17 cycles from 12/16/2018 to 12/09/2019.  NGS Results: PD-L1 50%  CURRENT THERAPY: Observation  BRIEF ONCOLOGIC HISTORY:  Oncology History  Primary cancer of right upper lobe of lung ([facility])  05/14/2015 Procedure   Port placement by Dr. Arnoldo Morale   04/01/2016 Initial Diagnosis   Primary cancer of right upper lobe of lung ([facility])   04/02/2016 Procedure   Ultrasound-guided core biopsy performed of a solid 1.5 cm soft tissue nodule in the right chest wall.   04/03/2016 Imaging   MRI brain- No acute and intracranial process or metastasis.  Moderate chronic small vessel ischemic disease.   04/04/2016 Pathology Results   Diagnosis Soft Tissue Needle Core Biopsy, Right Chest Wall SMOOTH MUSCLE NEOPLASM Microscopic Comment The differential diagnosis include low grade leiomyosarcoma. The neoplasm shows spindle cell morphology with rare mitotic figures and minimal atypia, no necrosis present. These cells stains positive for smooth muscle actin, desmin, negative for cytokeratin AE1&3, ck8/18, s100, cd117, cd 34 and cd99. This case also reviewed by Dr. [name] and agree.   04/05/2016 Procedure   CT-guided biopsy of right upper lobe lesion, with tissue specimen sent to pathology, by IR.   04/09/2016 Pathology Results   Lung, needle/core biopsy(ies), Right Upper Lobe - NON SMALL CELL CARCINOMA.   04/17/2016 Pathology Results   PDL1 High Expression.  TPS 50%.   04/24/2016 PET scan   1. Prominently hypermetabolic large right upper lobe mass with metastatic disease to the right  paratracheal and right hilar nodal chains, to the descending mesocolon, to the left upper buttock subcutaneous tissues, to the left upper lobe, to the right subscapularis muscle, and possibly to the left adrenal gland and right breast subcutaneous tissues. Appearance compatible with stage IV lung cancer. 2. There is some focal high activity in the ascending colon which is probably from peristalsis, less likely from a local colon mass. This does raise the possibility that the adjacent mesocolon tumor implant could be due to synchronous colon cancer rather than lung metastasis. 3. Other imaging findings of potential clinical significance: Coronary, aortic arch, and branch vessel atherosclerotic vascular disease. Aortoiliac atherosclerotic vascular disease. Deformity left proximal humerus and left glenohumeral joint from prior trauma. Enlarged prostate gland.   04/26/2016 Pathology Results   FoundationONE: Genomic alterations identified- KRAS S56C, CREBBP splice site [id number], LRP1B S515*, YFV49 S49*, QPR91 splice site [id number], SPTA1 splice site [id number], TP53 C135Y.  Additional findings- MS-Stable, TMB-intermediate (14 Muts/Mb).  No reportable alterations- EGFR, ALK, BRAF, MET, ERBB2, RET, ROS1.   05/17/2016 -  Chemotherapy   The patient had ONDANSETRON IVPB [facility] +/- DEXAMETHASONE, , Intravenous,  Once, 0 of 1 cycle  pembrolizumab (KEYTRUDA) 200 mg in sodium chloride 0.9 % 50 mL chemo infusion, 200 mg, Intravenous, Once, 0 of 5 cycles  for chemotherapy treatment.     08/06/2016 PET scan   IMPRESSION: 1. Overall considerable improvement. The right upper lobe mass is markedly reduced in volume and SUV. Thoracic adenopathy have resolved and the hypermetabolic lesion in the right subscapularis muscle has also resolved. 2. The left upper lobe nodule is no longer appreciably hypermetabolic,  and is highly indistinct although this may be due to motion artifact. Faint in indistinct  nodularity elsewhere in the lungs likewise not currently hypermetabolic. 3. There is some very faint residual low-grade activity along the subcutaneous deposit along the upper left buttock region. Marked reduction in size and activity of the tumor deposit adjacent to the descending colon. 4. Prior right pleural effusion has resolved. 5. Stable appearance of the adrenal glands, with low-level activity and a nodule in the left adrenal gland. 6. Stable small nodule in the right breast, low-grade metabolic activity with maximum SUV 2.2 (formerly 3.0) 7. Other imaging findings of potential clinical significance: Severe arthropathy of the left glenohumeral joint. Coronary, aortic arch, and branch vessel atherosclerotic vascular disease. Aortoiliac atherosclerotic vascular disease. Prominent prostate gland indents the bladder base.    12/16/2018 -  Chemotherapy   The patient had palonosetron (ALOXI) injection 0.25 mg, 0.25 mg, Intravenous,  Once, 4 of 4 cycles Administration: 0.25 mg (12/16/2018), 0.25 mg (01/27/2019), 0.25 mg (03/03/2019), 0.25 mg (01/06/2019) pegfilgrastim (NEULASTA) injection 6 mg, 6 mg, Subcutaneous, Once, 2 of 2 cycles Administration: 6 mg (01/08/2019) pegfilgrastim (NEULASTA ONPRO KIT) injection 6 mg, 6 mg, Subcutaneous, Once, 2 of 2 cycles pegfilgrastim-cbqv (UDENYCA) injection 6 mg, 6 mg, Subcutaneous, Once, 2 of 2 cycles Administration: 6 mg (01/29/2019), 6 mg (03/05/2019) ondansetron (ZOFRAN) 8 mg in sodium chloride 0.9 % 50 mL IVPB, 8 mg (100 % of original dose 8 mg), Intravenous,  Once, 3 of 3 cycles Dose modification: 8 mg (original dose 8 mg, Cycle 5) Administration: 8 mg (03/24/2019), 8 mg (04/16/2019) PEMEtrexed (ALIMTA) 1,100 mg in sodium chloride 0.9 % 100 mL chemo infusion, 490 mg/m2 = 1,125 mg, Intravenous,  Once, 6 of 6 cycles Dose modification: 400 mg/m2 (80 % of original dose 500 mg/m2, Cycle 3, Reason: Other (see comments), Comment: rash,  swelling) Administration: 1,100 mg (12/16/2018), 900 mg (01/27/2019), 900 mg (03/03/2019), 900 mg (03/24/2019), 900 mg (04/16/2019), 1,100 mg (01/06/2019) CARBOplatin (PARAPLATIN) 500 mg in sodium chloride 0.9 % 250 mL chemo infusion, 530 mg (110.7 % of original dose 477.5 mg), Intravenous,  Once, 4 of 4 cycles Dose modification:   (original dose 477.5 mg, Cycle 1),   (original dose 485.5 mg, Cycle 3),   (original dose 494 mg, Cycle 4),   (original dose 494 mg, Cycle 2) Administration: 500 mg (12/16/2018), 490 mg (01/27/2019), 490 mg (03/03/2019), 490 mg (01/06/2019) pembrolizumab (KEYTRUDA) 200 mg in sodium chloride 0.9 % 50 mL chemo infusion, 2 mg/kg = 200 mg, Intravenous, Once, 12 of 13 cycles Dose modification: 200 mg (original dose 2 mg/kg, Cycle 14, Reason: Provider Judgment) Administration: 200 mg (04/16/2019), 200 mg (05/31/2019), 200 mg (06/22/2019), 200 mg (07/13/2019), 200 mg (05/10/2019), 200 mg (08/03/2019), 200 mg (08/24/2019), 200 mg (09/14/2019), 200 mg (10/08/2019), 200 mg (10/29/2019), 200 mg (11/18/2019), 200 mg (12/09/2019) fosaprepitant (EMEND) 150 mg, dexamethasone (DECADRON) 12 mg in sodium chloride 0.9 % 145 mL IVPB, , Intravenous,  Once, 4 of 4 cycles Administration:  (12/16/2018),  (01/27/2019),  (03/03/2019),  (01/06/2019)  for chemotherapy treatment.    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  Two adjacent large masslike areas of consolidation within the right upper lobe with differential considerations including malignancy or pneumonia. Multiple enlarged right hilar and mediastinal lymph nodes which may be reactive or metastatic in etiology.  Additional indeterminate pulmonary nodules as above. Recommend attention on  follow-up.  Indeterminate 2.9 cm left adrenal nodule. This needs dedicated evaluation with pre and post contrast-enhanced CT or MRI  after confirmation of pulmonary process.  Indeterminate nodule within the right breast. Recommend dedicated evaluation with mammography.  Hepatic steatosis.   04/02/2016 Procedure   Ultrasound-guided core biopsy performed of a solid 1.5 cm soft tissue nodule in the right chest wall.   04/04/2016 Pathology Results   Diagnosis Soft Tissue Needle Core Biopsy, Right Chest Wall SMOOTH MUSCLE NEOPLASM Microscopic Comment The differential diagnosis include low grade leiomyosarcoma. The neoplasm shows spindle cell morphology with rare mitotic figures and minimal atypia, no necrosis present. These cells stains positive for smooth muscle actin, desmin, negative for cytokeratin AE1&3, ck8/18, s100, cd117, cd 34 and cd99. This case also reviewed by Dr. [name] and agree.      CANCER STAGING: Cancer Staging Primary cancer of right upper lobe of lung ([facility]) Staging form: Lung, AJCC 8th Edition - Clinical: Stage IVB (cT3, cN2, cM1c) - Signed by Baird Cancer, PA-C on 05/07/2016   INTERVAL HISTORY:  Mr. LADANIAN KELTER, a 74 y.o. male, returns for routine follow-up of his stage IV adenocarcinoma of right lung. Raman was last seen on 01/26/2020.  Today he reports having 3 episodes of softly formed diarrhea in the morning and it has eased up since last week; he averages about 3-4 episodes of softly formed diarrhea per day. He tolerated the Remicade well. He cut down his prednisone to 1 tablet daily since 9/27. His appetite is not as good as usual and his legs are slightly swollen.   REVIEW OF SYSTEMS:  Review of Systems  Constitutional: Positive for appetite change (50%) and fatigue.  Respiratory: Positive for shortness of breath.   Cardiovascular: Positive for chest pain (4/10 chest pain).  Gastrointestinal: Positive for diarrhea (3-4 episodes of soft diarrhea).  Neurological: Positive for numbness (hands & feet).  All other systems reviewed and are negative.   PAST MEDICAL/SURGICAL  HISTORY:  Past Medical History:  Diagnosis Date  . Adrenal mass, left ([facility]) 04/01/2016  . Diabetes mellitus ([facility]) 05/02/2016  . Diabetes mellitus without complication ([facility])   . Hypertension   . Mass of breast, right 04/01/2016  . Mass of upper lobe of right lung 04/01/2016   2 adjacent, 5 cm masses, hilar adenopathy  . Primary cancer of right upper lobe of lung ([facility]) 04/01/2016   2 adjacent, 5 cm masses, hilar adenopathy  . Pulmonary embolus ([facility]) 03/31/2016  . 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 COLONOSCOPY N/A 07/28/2017   Procedure: COLONOSCOPY;  Surgeon: Rogene [place], MD;  Location: [facility] ENDO SUITE;  Service: Endoscopy;  Laterality: N/A;  205  . PORTACATH PLACEMENT Left 05/13/2016   Procedure: INSERTION PORT-A-CATH;  Surgeon: Aviva Signs, MD;  Location: [facility] ORS;  Service: General;  Laterality: Left;    SOCIAL HISTORY:  Social History   Socioeconomic History  . Marital status: Divorced    Spouse name: Not on file  . Number of children: Not on file  . Years of education: Not on file  . Highest education level: Not on file  Occupational History  . Not on file  Tobacco Use  . Smoking status: Former Smoker    Years: 61.00    Quit date: 04/11/2014    Years since quitting: 5.8  . Smokeless tobacco: Never Used  . Tobacco comment: patient does vape smoking x 2 years  Vaping Use  . Vaping  Use: Every day  Substance and Sexual Activity  . Alcohol use: Not Currently    Comment: quit 10 years  . Drug use: No  . Sexual activity: Not on file  Other Topics Concern  . Not on file  Social History Narrative  . Not on file   Social Determinants of Health   Financial Resource Strain:   . Difficulty of Paying Living Expenses: Not on file  Food Insecurity:   . Worried About Charity fundraiser in the Last Year: Not on file  . Ran Out of Food in the Last Year: Not on file  Transportation Needs:   . Lack of Transportation (Medical):  Not on file  . Lack of Transportation (Non-Medical): Not on file  Physical Activity:   . Days of Exercise per Week: Not on file  . Minutes of Exercise per Session: Not on file  Stress:   . Feeling of Stress : Not on file  Social Connections:   . Frequency of Communication with Friends and Family: Not on file  . Frequency of Social Gatherings with Friends and Family: Not on file  . Attends Religious Services: Not on file  . Active Member of Clubs or Organizations: Not on file  . Attends Archivist Meetings: Not on file  . Marital Status: Not on file  Intimate Partner Violence:   . Fear of Current or Ex-Partner: Not on file  . Emotionally Abused: Not on file  . Physically Abused: Not on file  . Sexually Abused: Not on file    FAMILY HISTORY:  Family History  Problem Relation Age of Onset  . Stroke Mother   . Heart attack Father   . Heart attack Brother     CURRENT MEDICATIONS:  Current Outpatient Medications  Medication Sig Dispense Refill  . ALPRAZolam (XANAX) 1 MG tablet Take 1 mg by mouth 2 (two) times daily as needed for anxiety.    Marland Kitchen amLODipine (NORVASC) 2.5 MG tablet Take 2.5 mg by mouth daily.  4  . diphenoxylate-atropine (LOMOTIL) 2.5-0.025 MG tablet Take 2 tablets at first watery stool then 1 tablet with each watery stool. 60 tablet 2  . DULoxetine (CYMBALTA) 60 MG capsule Take 1 capsule (60 mg total) by mouth daily. 30 capsule 6  . famotidine (PEPCID) 20 MG tablet Take 1 tablet (20 mg total) by mouth daily. 30 tablet 2  . folic acid (FOLVITE) 1 MG tablet TAKE ONE TABLET BY MOUTH ONCE DAILY. START 5-7 DAYS BEFORE ALIMTA CHEMOTHERAPY AND CONTINUE FOR 21 DAYS AFTER CHEMOTHERAPY. 100 tablet 0  . furosemide (LASIX) 20 MG tablet Take 2 tablets (40 mg total) by mouth daily. 20 tablet 0  . gabapentin (NEURONTIN) 400 MG capsule Take 1 capsule (400 mg total) by mouth 3 (three) times daily. 90 capsule 3  . GLIPIZIDE XL 5 MG 24 hr tablet Take 5 mg by mouth daily.  4  .  hydrocortisone 2.5 % lotion Apply topically as needed.     . lactulose (CHRONULAC) 10 GM/15ML solution Take 30 mLs (20 g total) by mouth at bedtime as needed for mild constipation. 480 mL 2  . levocetirizine (XYZAL) 5 MG tablet Take 5 mg by mouth at bedtime.    . lidocaine-prilocaine (EMLA) cream APPLY A QUARTER SIZE AMOUNT TO THE AFFECTED AREA 1 HOUR PRIOR TO COMING TO CHEMOTHERAPY. COVER WITH A PLASTIC WRAP. 30 g 0  . magnesium oxide (MAG-OX) 400 (241.3 Mg) MG tablet Take 1 tablet (400 mg total) by mouth  2 (two) times daily. 60 tablet 2  . metFORMIN (GLUCOPHAGE) 500 MG tablet Take 500 mg by mouth 2 (two) times daily.  4  . metoprolol succinate (TOPROL-XL) 25 MG 24 hr tablet TAKE ONE TABLET BY MOUTH IN THE EVENING. 30 tablet 0  . ondansetron (ZOFRAN) 4 MG tablet Take 1 tablet (4 mg total) by mouth every 8 (eight) hours as needed for nausea or vomiting. 4 tablet 0  . oxyCODONE-acetaminophen (PERCOCET) 10-325 MG tablet Take 1 tablet by mouth as needed.   0  . Pembrolizumab (KEYTRUDA IV) Inject into the vein.    . potassium chloride (K-DUR,KLOR-CON) 10 MEQ tablet Take 10 mEq by mouth daily.   0  . predniSONE (DELTASONE) 20 MG tablet Take 2.5 tablets (50 mg total) by mouth daily with breakfast. 70 tablet 0  . prochlorperazine (COMPAZINE) 10 MG tablet Take 1 tablet (10 mg total) by mouth every 6 (six) hours as needed (Nausea or vomiting). 30 tablet 1  . simvastatin (ZOCOR) 20 MG tablet Take 20 mg by mouth every evening.  5  . torsemide (DEMADEX) 20 MG tablet Take 1 tablet (20 mg total) by mouth daily. 30 tablet 1   No current facility-administered medications for this visit.    ALLERGIES:  No Known Allergies  PHYSICAL EXAM:  Performance status (ECOG): 1 - Symptomatic but completely ambulatory  Vitals:   02/02/20 1405  BP: (!) 153/84  Pulse: (!) 101  Resp: 16  SpO2: 97%   Wt Readings from Last 3 Encounters:  02/02/20 226 lb (102.5 kg)  01/27/20 220 lb 12.8 oz (100.2 kg)  01/26/20 220 lb  12.8 oz (100.2 kg)   Physical Exam Vitals reviewed.  Constitutional:      Appearance: Normal appearance. He is obese.  Cardiovascular:     Rate and Rhythm: Normal rate and regular rhythm.     Pulses: Normal pulses.     Heart sounds: Normal heart sounds.  Pulmonary:     Effort: Pulmonary effort is normal.     Breath sounds: Normal breath sounds.  Musculoskeletal:     Right lower leg: Edema (1+) present.     Left lower leg: Edema (1+) present.  Neurological:     General: No focal deficit present.     Mental Status: He is alert and oriented to person, place, and time.  Psychiatric:        Mood and Affect: Mood normal.        Behavior: Behavior normal.      LABORATORY DATA:  I have reviewed the labs as listed.  CBC Latest Ref Rng & Units 02/02/2020 01/26/2020 01/20/2020  WBC 4.0 - 10.5 K/uL 10.9(H) 11.8(H) 12.1(H)  Hemoglobin 13.0 - 17.0 g/dL 14.6 15.3 14.6  Hematocrit 39 - 52 % 45.6 48.3 46.5  Platelets 150 - 400 K/uL 179 198 196   CMP Latest Ref Rng & Units 02/02/2020 01/26/2020 01/20/2020  Glucose 70 - 99 mg/dL 225(H) 264(H) 200(H)  BUN 8 - 23 mg/dL 22 33(H) 30(H)  Creatinine 0.61 - 1.24 mg/dL 1.49(H) 1.81(H) 1.67(H)  Sodium 135 - 145 mmol/L 139 137 136  Potassium 3.5 - 5.1 mmol/L 3.8 3.9 4.5  Chloride 98 - 111 mmol/L 102 103 101  CO2 22 - 32 mmol/L _0 Calcium 8.9 - 10.3 mg/dL 8.5(L) 8.3(L) 8.7(L)  Total Protein 6.5 - 8.1 g/dL 6.0(L) 6.2(L) 6.7  Total Bilirubin 0.3 - 1.2 mg/dL 0.8 0.7 0.6  Alkaline Phos 38 - 126 U/L 58 71 67  AST 15 - 41 U/L _0 ALT 0 - 44 U/L 27 32 22    DIAGNOSTIC IMAGING:  I have independently reviewed the scans and discussed with the patient. No results found.   ASSESSMENT:  1. Stage IV adenocarcinoma lung, PD-L1 50%, other actionable mutations negative: --Keytruda from 05/17/2016 through 09/24/2018 with mild progression. -4 cycles of carboplatin, pemetrexed from 12/25/2018 through 03/03/2019. -Pemetrexed maintenance from 03/24/2019  through 04/12/2019 discontinued secondary to renal insufficiency. -CT chest with contrast from 09/30/2019 shows right upper lobe lung lesion measuring 3.8 x 2.3)(4.2 x 2.6 cm). No change in the left upper lobe nodule. Subcarinal lymph node measures 11 mm, previously 7 mm. No definite left hilar adenopathy. Stable left adrenal nodule. -CT chest on 12/29/2019 shows 4 cm posterior right upper lobe mass grossly unchanged. Stable 12 mm right hilar node. Stable 2.8 cm left adrenal nodule. -Pembrolizumab held since 12/30/2019 for diarrhea. Last dose was on 12/09/2019. -Remicade 5 mg/kg on 01/27/2020.  2. Brain metastasis: -SRS to the right parietal lobe lesion with surrounding edema on 11/24/2018. -MRI of the brain on 09/16/2019 shows stable treated right parietal metastasis. No new mass or abnormal enhancement. -MRI of the brain on 12/23/2019 shows unchanged 6 mm treated enhancing lesion in the right parietal lobe. No new lesions.   PLAN:  1. Stage IV adenocarcinoma lung, PD-L1 50%, other actionable mutations negative: -He did receive Remicade on 01/27/2020. -He cut back prednisone to 1 tablet daily on 01/31/2020. -Reviewed labs from today which shows normal LFTs and CBC. -RTC 2 weeks for follow-up.  Continue to hold Keytruda.  2. Brain metastasis: -No symptoms of recurrence.  3.  CKD: -Creatinine today improved to 1.49.  4. Peripheral neuropathy: -Continue gabapentin and duloxetine.  5. Hypomagnesemia: -Continue magnesium twice daily.  6. Immunotherapy related colitis: -Currently taking prednisone 20 mg daily, taper on 01/31/2020. -I have told him to cut back to half pill (10 mg) on 02/07/2020.  RTC 2 weeks for further taper if diarrhea continues to improve. -Currently having 2-3 episodes of loose stools rather than watery stools.   Orders placed this encounter:  No orders of the defined types were placed in this encounter.    Derek Jack, MD [place] [phone]   I, Milinda Antis, am acting as a scribe for Dr. Sanda Linger.  I, Derek Jack MD, have reviewed the above documentation for accuracy and completeness, and I agree with the above.

## 2020-02-16 ENCOUNTER — Inpatient Hospital Stay (HOSPITAL_COMMUNITY): Payer: Medicare Other | Attending: Hematology | Admitting: Hematology

## 2020-02-16 ENCOUNTER — Inpatient Hospital Stay (HOSPITAL_COMMUNITY): Payer: Medicare Other

## 2020-02-16 ENCOUNTER — Other Ambulatory Visit: Payer: Self-pay

## 2020-02-16 VITALS — BP 149/88 | HR 81 | Temp 97.0°F | Resp 20 | Wt 220.7 lb

## 2020-02-16 DIAGNOSIS — Z7952 Long term (current) use of systemic steroids: Secondary | ICD-10-CM | POA: Insufficient documentation

## 2020-02-16 DIAGNOSIS — I10 Essential (primary) hypertension: Secondary | ICD-10-CM | POA: Diagnosis not present

## 2020-02-16 DIAGNOSIS — Z86711 Personal history of pulmonary embolism: Secondary | ICD-10-CM | POA: Insufficient documentation

## 2020-02-16 DIAGNOSIS — C3411 Malignant neoplasm of upper lobe, right bronchus or lung: Secondary | ICD-10-CM

## 2020-02-16 DIAGNOSIS — Z7984 Long term (current) use of oral hypoglycemic drugs: Secondary | ICD-10-CM | POA: Insufficient documentation

## 2020-02-16 DIAGNOSIS — E278 Other specified disorders of adrenal gland: Secondary | ICD-10-CM | POA: Insufficient documentation

## 2020-02-16 DIAGNOSIS — N189 Chronic kidney disease, unspecified: Secondary | ICD-10-CM | POA: Diagnosis not present

## 2020-02-16 DIAGNOSIS — Z8249 Family history of ischemic heart disease and other diseases of the circulatory system: Secondary | ICD-10-CM | POA: Diagnosis not present

## 2020-02-16 DIAGNOSIS — Z79899 Other long term (current) drug therapy: Secondary | ICD-10-CM | POA: Diagnosis not present

## 2020-02-16 DIAGNOSIS — C7931 Secondary malignant neoplasm of brain: Secondary | ICD-10-CM | POA: Insufficient documentation

## 2020-02-16 DIAGNOSIS — Z87891 Personal history of nicotine dependence: Secondary | ICD-10-CM | POA: Diagnosis not present

## 2020-02-16 DIAGNOSIS — E119 Type 2 diabetes mellitus without complications: Secondary | ICD-10-CM | POA: Diagnosis not present

## 2020-02-16 LAB — COMPREHENSIVE METABOLIC PANEL
ALT: 24 U/L (ref 0–44)
AST: 16 U/L (ref 15–41)
Albumin: 3.6 g/dL (ref 3.5–5.0)
Alkaline Phosphatase: 57 U/L (ref 38–126)
Anion gap: 10 (ref 5–15)
BUN: 26 mg/dL — ABNORMAL HIGH (ref 8–23)
CO2: 28 mmol/L (ref 22–32)
Calcium: 8.8 mg/dL — ABNORMAL LOW (ref 8.9–10.3)
Chloride: 99 mmol/L (ref 98–111)
Creatinine, Ser: 1.42 mg/dL — ABNORMAL HIGH (ref 0.61–1.24)
GFR, Estimated: 48 mL/min — ABNORMAL LOW (ref >60)
Glucose, Bld: 157 mg/dL — ABNORMAL HIGH (ref 70–99)
Potassium: 4.8 mmol/L (ref 3.5–5.1)
Sodium: 137 mmol/L (ref 135–145)
Total Bilirubin: 0.5 mg/dL (ref 0.3–1.2)
Total Protein: 6.4 g/dL — ABNORMAL LOW (ref 6.5–8.1)

## 2020-02-16 LAB — CBC WITH DIFFERENTIAL/PLATELET
Abs Immature Granulocytes: 0.21 10*3/uL — ABNORMAL HIGH (ref 0.00–0.07)
Basophils Absolute: 0 10*3/uL (ref 0.0–0.1)
Basophils Relative: 0 %
Eosinophils Absolute: 0 10*3/uL (ref 0.0–0.5)
Eosinophils Relative: 0 %
HCT: 45.3 % (ref 39.0–52.0)
Hemoglobin: 14.7 g/dL (ref 13.0–17.0)
Immature Granulocytes: 2 %
Lymphocytes Relative: 10 %
Lymphs Abs: 1.1 10*3/uL (ref 0.7–4.0)
MCH: 29.9 pg (ref 26.0–34.0)
MCHC: 32.5 g/dL (ref 30.0–36.0)
MCV: 92.1 fL (ref 80.0–100.0)
Monocytes Absolute: 0.4 10*3/uL (ref 0.1–1.0)
Monocytes Relative: 3 %
Neutro Abs: 9.7 10*3/uL — ABNORMAL HIGH (ref 1.7–7.7)
Neutrophils Relative %: 85 %
Platelets: 215 10*3/uL (ref 150–400)
RBC: 4.92 MIL/uL (ref 4.22–5.81)
RDW: 15.8 % — ABNORMAL HIGH (ref 11.5–15.5)
WBC: 11.4 10*3/uL — ABNORMAL HIGH (ref 4.0–10.5)
nRBC: 0 % (ref 0.0–0.2)

## 2020-02-16 LAB — MAGNESIUM: Magnesium: 2.2 mg/dL (ref 1.7–2.4)

## 2020-02-16 NOTE — Patient Instructions (Signed)
[place] at [facility] Discharge Instructions  You were seen today by Dr. Delton Coombes. He went over your recent results. Start taking 1/2 tablet of prednisone daily for 1 week, then take 1/2 tablet every other day for 1 week. You will be scheduled for a CT scan of your chest before your next visit. Dr. Delton Coombes will see you back in 2 weeks for labs and follow up.   Thank you for choosing [place] at [facility] to provide your oncology and hematology care.  To afford each patient quality time with our provider, please arrive at least 15 minutes before your scheduled appointment time.   If you have a lab appointment with the [facility] please come in thru the Main Entrance and check in at the main information desk  You need to re-schedule your appointment should you arrive 10 or more minutes late.  We strive to give you quality time with our providers, and arriving late affects you and other patients whose appointments are after yours.  Also, if you no show three or more times for appointments you may be dismissed from the clinic at the providers discretion.     Again, thank you for choosing [facility].  Our hope is that these requests will decrease the amount of time that you wait before being seen by our physicians.       _____________________________________________________________  Should you have questions after your visit to [facility], please contact our office at [phone] between the hours of 8:00 a.m. and 4:30 p.m.  Voicemails left after 4:00 p.m. will not be returned until the following business day.  For prescription refill requests, have your pharmacy contact our office and allow 72 hours.    Cancer Center Support Programs:   > Cancer Support Group  2nd Tuesday of the month 1pm-2pm, Journey Room

## 2020-02-16 NOTE — Progress Notes (Signed)
[facility] [address], [place] [address]   CLINIC:  Medical Oncology/Hematology  PCP:  Celene Squibb, MD [address] Liana Crocker [place] [address] [phone]   REASON FOR VISIT:  Follow-up for stage IV adenocarcinoma of right lung  PRIOR THERAPY:  1. Keytruda from 05/17/2016 to 09/24/2018. 2. Carboplatin, pemetrexed x 4 cycles from 12/25/2018 to 03/03/2019. 3. Keytruda x 17 cycles from 12/16/2018 to 12/09/2019.  NGS Results: PD-L1 50%  CURRENT THERAPY: Observation  BRIEF ONCOLOGIC HISTORY:  Oncology History  Primary cancer of right upper lobe of lung ([facility])  05/14/2015 Procedure   Port placement by Dr. Arnoldo Morale   04/01/2016 Initial Diagnosis   Primary cancer of right upper lobe of lung ([facility])   04/02/2016 Procedure   Ultrasound-guided core biopsy performed of a solid 1.5 cm soft tissue nodule in the right chest wall.   04/03/2016 Imaging   MRI brain- No acute and intracranial process or metastasis.  Moderate chronic small vessel ischemic disease.   04/04/2016 Pathology Results   Diagnosis Soft Tissue Needle Core Biopsy, Right Chest Wall SMOOTH MUSCLE NEOPLASM Microscopic Comment The differential diagnosis include low grade leiomyosarcoma. The neoplasm shows spindle cell morphology with rare mitotic figures and minimal atypia, no necrosis present. These cells stains positive for smooth muscle actin, desmin, negative for cytokeratin AE1&3, ck8/18, s100, cd117, cd 34 and cd99. This case also reviewed by Dr. [name] and agree.   04/05/2016 Procedure   CT-guided biopsy of right upper lobe lesion, with tissue specimen sent to pathology, by IR.   04/09/2016 Pathology Results   Lung, needle/core biopsy(ies), Right Upper Lobe - NON SMALL CELL CARCINOMA.   04/17/2016 Pathology Results   PDL1 High Expression.  TPS 50%.   04/24/2016 PET scan   1. Prominently hypermetabolic large right upper lobe mass with metastatic disease to the right  paratracheal and right hilar nodal chains, to the descending mesocolon, to the left upper buttock subcutaneous tissues, to the left upper lobe, to the right subscapularis muscle, and possibly to the left adrenal gland and right breast subcutaneous tissues. Appearance compatible with stage IV lung cancer. 2. There is some focal high activity in the ascending colon which is probably from peristalsis, less likely from a local colon mass. This does raise the possibility that the adjacent mesocolon tumor implant could be due to synchronous colon cancer rather than lung metastasis. 3. Other imaging findings of potential clinical significance: Coronary, aortic arch, and branch vessel atherosclerotic vascular disease. Aortoiliac atherosclerotic vascular disease. Deformity left proximal humerus and left glenohumeral joint from prior trauma. Enlarged prostate gland.   04/26/2016 Pathology Results   FoundationONE: Genomic alterations identified- KRAS S56C, CREBBP splice site [id number], LRP1B S515*, YFV49 S49*, QPR91 splice site [id number], SPTA1 splice site [id number], TP53 C135Y.  Additional findings- MS-Stable, TMB-intermediate (14 Muts/Mb).  No reportable alterations- EGFR, ALK, BRAF, MET, ERBB2, RET, ROS1.   05/17/2016 -  Chemotherapy   The patient had ONDANSETRON IVPB [facility] +/- DEXAMETHASONE, , Intravenous,  Once, 0 of 1 cycle  pembrolizumab (KEYTRUDA) 200 mg in sodium chloride 0.9 % 50 mL chemo infusion, 200 mg, Intravenous, Once, 0 of 5 cycles  for chemotherapy treatment.     08/06/2016 PET scan   IMPRESSION: 1. Overall considerable improvement. The right upper lobe mass is markedly reduced in volume and SUV. Thoracic adenopathy have resolved and the hypermetabolic lesion in the right subscapularis muscle has also resolved. 2. The left upper lobe nodule is no longer appreciably hypermetabolic,  and is highly indistinct although this may be due to motion artifact. Faint in indistinct  nodularity elsewhere in the lungs likewise not currently hypermetabolic. 3. There is some very faint residual low-grade activity along the subcutaneous deposit along the upper left buttock region. Marked reduction in size and activity of the tumor deposit adjacent to the descending colon. 4. Prior right pleural effusion has resolved. 5. Stable appearance of the adrenal glands, with low-level activity and a nodule in the left adrenal gland. 6. Stable small nodule in the right breast, low-grade metabolic activity with maximum SUV 2.2 (formerly 3.0) 7. Other imaging findings of potential clinical significance: Severe arthropathy of the left glenohumeral joint. Coronary, aortic arch, and branch vessel atherosclerotic vascular disease. Aortoiliac atherosclerotic vascular disease. Prominent prostate gland indents the bladder base.    12/16/2018 -  Chemotherapy   The patient had palonosetron (ALOXI) injection 0.25 mg, 0.25 mg, Intravenous,  Once, 4 of 4 cycles Administration: 0.25 mg (12/16/2018), 0.25 mg (01/27/2019), 0.25 mg (03/03/2019), 0.25 mg (01/06/2019) pegfilgrastim (NEULASTA) injection 6 mg, 6 mg, Subcutaneous, Once, 2 of 2 cycles Administration: 6 mg (01/08/2019) pegfilgrastim (NEULASTA ONPRO KIT) injection 6 mg, 6 mg, Subcutaneous, Once, 2 of 2 cycles pegfilgrastim-cbqv (UDENYCA) injection 6 mg, 6 mg, Subcutaneous, Once, 2 of 2 cycles Administration: 6 mg (01/29/2019), 6 mg (03/05/2019) ondansetron (ZOFRAN) 8 mg in sodium chloride 0.9 % 50 mL IVPB, 8 mg (100 % of original dose 8 mg), Intravenous,  Once, 3 of 3 cycles Dose modification: 8 mg (original dose 8 mg, Cycle 5) Administration: 8 mg (03/24/2019), 8 mg (04/16/2019) PEMEtrexed (ALIMTA) 1,100 mg in sodium chloride 0.9 % 100 mL chemo infusion, 490 mg/m2 = 1,125 mg, Intravenous,  Once, 6 of 6 cycles Dose modification: 400 mg/m2 (80 % of original dose 500 mg/m2, Cycle 3, Reason: Other (see comments), Comment: rash,  swelling) Administration: 1,100 mg (12/16/2018), 900 mg (01/27/2019), 900 mg (03/03/2019), 900 mg (03/24/2019), 900 mg (04/16/2019), 1,100 mg (01/06/2019) CARBOplatin (PARAPLATIN) 500 mg in sodium chloride 0.9 % 250 mL chemo infusion, 530 mg (110.7 % of original dose 477.5 mg), Intravenous,  Once, 4 of 4 cycles Dose modification:   (original dose 477.5 mg, Cycle 1),   (original dose 485.5 mg, Cycle 3),   (original dose 494 mg, Cycle 4),   (original dose 494 mg, Cycle 2) Administration: 500 mg (12/16/2018), 490 mg (01/27/2019), 490 mg (03/03/2019), 490 mg (01/06/2019) pembrolizumab (KEYTRUDA) 200 mg in sodium chloride 0.9 % 50 mL chemo infusion, 2 mg/kg = 200 mg, Intravenous, Once, 12 of 13 cycles Dose modification: 200 mg (original dose 2 mg/kg, Cycle 14, Reason: Provider Judgment) Administration: 200 mg (04/16/2019), 200 mg (05/31/2019), 200 mg (06/22/2019), 200 mg (07/13/2019), 200 mg (05/10/2019), 200 mg (08/03/2019), 200 mg (08/24/2019), 200 mg (09/14/2019), 200 mg (10/08/2019), 200 mg (10/29/2019), 200 mg (11/18/2019), 200 mg (12/09/2019) fosaprepitant (EMEND) 150 mg, dexamethasone (DECADRON) 12 mg in sodium chloride 0.9 % 145 mL IVPB, , Intravenous,  Once, 4 of 4 cycles Administration:  (12/16/2018),  (01/27/2019),  (03/03/2019),  (01/06/2019)  for chemotherapy treatment.    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  Two adjacent large masslike areas of consolidation within the right upper lobe with differential considerations including malignancy or pneumonia. Multiple enlarged right hilar and mediastinal lymph nodes which may be reactive or metastatic in etiology.  Additional indeterminate pulmonary nodules as above. Recommend attention on  follow-up.  Indeterminate 2.9 cm left adrenal nodule. This needs dedicated evaluation with pre and post contrast-enhanced CT or MRI  after confirmation of pulmonary process.  Indeterminate nodule within the right breast. Recommend dedicated evaluation with mammography.  Hepatic steatosis.   04/02/2016 Procedure   Ultrasound-guided core biopsy performed of a solid 1.5 cm soft tissue nodule in the right chest wall.   04/04/2016 Pathology Results   Diagnosis Soft Tissue Needle Core Biopsy, Right Chest Wall SMOOTH MUSCLE NEOPLASM Microscopic Comment The differential diagnosis include low grade leiomyosarcoma. The neoplasm shows spindle cell morphology with rare mitotic figures and minimal atypia, no necrosis present. These cells stains positive for smooth muscle actin, desmin, negative for cytokeratin AE1&3, ck8/18, s100, cd117, cd 34 and cd99. This case also reviewed by Dr. [name] and agree.      CANCER STAGING: Cancer Staging Primary cancer of right upper lobe of lung ([facility]) Staging form: Lung, AJCC 8th Edition - Clinical: Stage IVB (cT3, cN2, cM1c) - Signed by Baird Cancer, PA-C on 05/07/2016   INTERVAL HISTORY:  Mr. Darrell Christensen, a 74 y.o. male, returns for routine follow-up of his stage IV adenocarcinoma of right lung. Darrell Christensen was last seen on 02/02/2020.  Today he reports feeling well. He reports having soft BM's once daily. He is taking 1 tablet of prednisone daily.    REVIEW OF SYSTEMS:  Review of Systems  Constitutional: Positive for appetite change (50%) and fatigue (25%).  Respiratory: Positive for shortness of breath (w/ exertion).   Cardiovascular: Positive for chest pain (5/10 rib pain).  Genitourinary: Positive for frequency.   Neurological: Positive for numbness.  All other systems reviewed and are negative.   PAST MEDICAL/SURGICAL HISTORY:  Past Medical History:  Diagnosis Date  . Adrenal mass, left ([facility]) 04/01/2016  . Diabetes mellitus ([facility]) 05/02/2016  . Diabetes mellitus without complication ([facility])   . Hypertension   . Mass of breast, right 04/01/2016  . Mass of upper  lobe of right lung 04/01/2016   2 adjacent, 5 cm masses, hilar adenopathy  . Primary cancer of right upper lobe of lung ([facility]) 04/01/2016   2 adjacent, 5 cm masses, hilar adenopathy  . Pulmonary embolus ([facility]) 03/31/2016  . 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 COLONOSCOPY N/A 07/28/2017   Procedure: COLONOSCOPY;  Surgeon: Rogene Houston, MD;  Location: [facility] ENDO SUITE;  Service: Endoscopy;  Laterality: N/A;  205  . PORTACATH PLACEMENT Left 05/13/2016   Procedure: INSERTION PORT-A-CATH;  Surgeon: Aviva Signs, MD;  Location: [facility] ORS;  Service: General;  Laterality: Left;    SOCIAL HISTORY:  Social History   Socioeconomic History  . Marital status: Divorced    Spouse name: Not on file  . Number of children: Not on file  . Years of education: Not on file  . Highest education level: Not on file  Occupational History  . Not on file  Tobacco Use  . Smoking status: Former Smoker    Years: 61.00    Quit date: 04/11/2014    Years since quitting: 5.8  . Smokeless tobacco: Never Used  . Tobacco comment: patient does vape smoking x 2 years  Vaping Use  . Vaping Use: Every day  Substance and Sexual Activity  . Alcohol use: Not Currently    Comment: quit 10 years  . Drug use: No  . Sexual activity: Not on file  Other Topics Concern  . Not on file  Social History  Narrative  . Not on file   Social Determinants of Health   Financial Resource Strain:   . Difficulty of Paying Living Expenses: Not on file  Food Insecurity:   . Worried About Charity fundraiser in the Last Year: Not on file  . Ran Out of Food in the Last Year: Not on file  Transportation Needs:   . Lack of Transportation (Medical): Not on file  . Lack of Transportation (Non-Medical): Not on file  Physical Activity:   . Days of Exercise per Week: Not on file  . Minutes of Exercise per Session: Not on file  Stress:   . Feeling of Stress : Not on file  Social Connections:    . Frequency of Communication with Friends and Family: Not on file  . Frequency of Social Gatherings with Friends and Family: Not on file  . Attends Religious Services: Not on file  . Active Member of Clubs or Organizations: Not on file  . Attends Archivist Meetings: Not on file  . Marital Status: Not on file  Intimate Partner Violence:   . Fear of Current or Ex-Partner: Not on file  . Emotionally Abused: Not on file  . Physically Abused: Not on file  . Sexually Abused: Not on file    FAMILY HISTORY:  Family History  Problem Relation Age of Onset  . Stroke Mother   . Heart attack Father   . Heart attack Brother     CURRENT MEDICATIONS:  Current Outpatient Medications  Medication Sig Dispense Refill  . ALPRAZolam (XANAX) 1 MG tablet Take 1 mg by mouth 2 (two) times daily as needed for anxiety.    Marland Kitchen amLODipine (NORVASC) 2.5 MG tablet Take 2.5 mg by mouth daily.  4  . diphenoxylate-atropine (LOMOTIL) 2.5-0.025 MG tablet Take 2 tablets at first watery stool then 1 tablet with each watery stool. 60 tablet 2  . DULoxetine (CYMBALTA) 60 MG capsule Take 1 capsule (60 mg total) by mouth daily. 30 capsule 6  . famotidine (PEPCID) 20 MG tablet Take 1 tablet (20 mg total) by mouth daily. 30 tablet 2  . folic acid (FOLVITE) 1 MG tablet TAKE ONE TABLET BY MOUTH ONCE DAILY. START 5-7 DAYS BEFORE ALIMTA CHEMOTHERAPY AND CONTINUE FOR 21 DAYS AFTER CHEMOTHERAPY. 100 tablet 0  . furosemide (LASIX) 20 MG tablet Take 2 tablets (40 mg total) by mouth daily. 20 tablet 0  . gabapentin (NEURONTIN) 400 MG capsule Take 1 capsule (400 mg total) by mouth 3 (three) times daily. 90 capsule 3  . GLIPIZIDE XL 5 MG 24 hr tablet Take 5 mg by mouth daily.  4  . hydrocortisone 2.5 % lotion Apply topically as needed.     . lactulose (CHRONULAC) 10 GM/15ML solution Take 30 mLs (20 g total) by mouth at bedtime as needed for mild constipation. 480 mL 2  . levocetirizine (XYZAL) 5 MG tablet Take 5 mg by mouth  at bedtime.    . magnesium oxide (MAG-OX) 400 (241.3 Mg) MG tablet Take 1 tablet (400 mg total) by mouth 2 (two) times daily. 60 tablet 2  . metFORMIN (GLUCOPHAGE) 500 MG tablet Take 500 mg by mouth 2 (two) times daily.  4  . metoprolol succinate (TOPROL-XL) 25 MG 24 hr tablet TAKE ONE TABLET BY MOUTH IN THE EVENING. 30 tablet 0  . ondansetron (ZOFRAN) 4 MG tablet Take 1 tablet (4 mg total) by mouth every 8 (eight) hours as needed for nausea or vomiting. 4 tablet 0  .  oxyCODONE-acetaminophen (PERCOCET) 10-325 MG tablet Take 1 tablet by mouth as needed.   0  . Pembrolizumab (KEYTRUDA IV) Inject into the vein.    . potassium chloride (K-DUR,KLOR-CON) 10 MEQ tablet Take 10 mEq by mouth daily.   0  . predniSONE (DELTASONE) 20 MG tablet Take 2.5 tablets (50 mg total) by mouth daily with breakfast. 70 tablet 0  . prochlorperazine (COMPAZINE) 10 MG tablet Take 1 tablet (10 mg total) by mouth every 6 (six) hours as needed (Nausea or vomiting). 30 tablet 1  . simvastatin (ZOCOR) 20 MG tablet Take 20 mg by mouth every evening.  5  . torsemide (DEMADEX) 20 MG tablet Take 1 tablet (20 mg total) by mouth daily. 30 tablet 1  . lidocaine-prilocaine (EMLA) cream APPLY A QUARTER SIZE AMOUNT TO THE AFFECTED AREA 1 HOUR PRIOR TO COMING TO CHEMOTHERAPY. COVER WITH A PLASTIC WRAP. (Patient not taking: Reported on 02/16/2020) 30 g 0   No current facility-administered medications for this visit.    ALLERGIES:  No Known Allergies  PHYSICAL EXAM:  Performance status (ECOG): 1 - Symptomatic but completely ambulatory  Vitals:   02/16/20 1349  BP: (!) 149/88  Pulse: 81  Resp: 20  Temp: (!) 97 F (36.1 C)  SpO2: 95%   Wt Readings from Last 3 Encounters:  02/16/20 220 lb 10.9 oz (100.1 kg)  02/02/20 226 lb (102.5 kg)  01/27/20 220 lb 12.8 oz (100.2 kg)   Physical Exam Vitals reviewed.  Constitutional:      Appearance: Normal appearance. He is obese.  Cardiovascular:     Rate and Rhythm: Normal rate and  regular rhythm.     Pulses: Normal pulses.     Heart sounds: Normal heart sounds.  Pulmonary:     Effort: Pulmonary effort is normal.     Breath sounds: Normal breath sounds.  Musculoskeletal:     Right lower leg: Edema (1+) present.     Left lower leg: Edema (1+) present.  Neurological:     General: No focal deficit present.     Mental Status: He is alert and oriented to person, place, and time.  Psychiatric:        Mood and Affect: Mood normal.        Behavior: Behavior normal.      LABORATORY DATA:  I have reviewed the labs as listed.  CBC Latest Ref Rng & Units 02/16/2020 02/02/2020 01/26/2020  WBC 4.0 - 10.5 K/uL 11.4(H) 10.9(H) 11.8(H)  Hemoglobin 13.0 - 17.0 g/dL 14.7 14.6 15.3  Hematocrit 39 - 52 % 45.3 45.6 48.3  Platelets 150 - 400 K/uL 215 179 198   CMP Latest Ref Rng & Units 02/16/2020 02/02/2020 01/26/2020  Glucose 70 - 99 mg/dL 157(H) 225(H) 264(H)  BUN 8 - 23 mg/dL 26(H) 22 33(H)  Creatinine 0.61 - 1.24 mg/dL 1.42(H) 1.49(H) 1.81(H)  Sodium 135 - 145 mmol/L 137 139 137  Potassium 3.5 - 5.1 mmol/L 4.8 3.8 3.9  Chloride 98 - 111 mmol/L 99 102 103  CO2 22 - 32 mmol/L $RemoveB'28 26 22  'pkGebPgJ$ Calcium 8.9 - 10.3 mg/dL 8.8(L) 8.5(L) 8.3(L)  Total Protein 6.5 - 8.1 g/dL 6.4(L) 6.0(L) 6.2(L)  Total Bilirubin 0.3 - 1.2 mg/dL 0.5 0.8 0.7  Alkaline Phos 38 - 126 U/L 57 58 71  AST 15 - 41 U/L $Remo'16 19 18  'TveDm$ ALT 0 - 44 U/L 24 27 32    DIAGNOSTIC IMAGING:  I have independently reviewed the scans and discussed with the patient. No  results found.   ASSESSMENT:  1. Stage IV adenocarcinoma lung, PD-L1 50%, other actionable mutations negative: --Keytruda from 05/17/2016 through 09/24/2018 with mild progression. -4 cycles of carboplatin, pemetrexed from 12/25/2018 through 03/03/2019. -Pemetrexed maintenance from 03/24/2019 through 04/12/2019 discontinued secondary to renal insufficiency. -CT chest with contrast from 09/30/2019 shows right upper lobe lung lesion measuring 3.8 x 2.3)(4.2 x 2.6 cm).  No change in the left upper lobe nodule. Subcarinal lymph node measures 11 mm, previously 7 mm. No definite left hilar adenopathy. Stable left adrenal nodule. -CT chest on 12/29/2019 shows 4 cm posterior right upper lobe mass grossly unchanged. Stable 12 mm right hilar node. Stable 2.8 cm left adrenal nodule. -Pembrolizumab held since 12/30/2019 for diarrhea. Last dose was on 12/09/2019. -Remicade 5 mg/kg on 01/27/2020.  2. Brain metastasis: -SRS to the right parietal lobe lesion with surrounding edema on 11/24/2018. -MRI of the brain on 09/16/2019 shows stable treated right parietal metastasis. No new mass or abnormal enhancement. -MRI of the brain on 12/23/2019 shows unchanged 6 mm treated enhancing lesion in the right parietal lobe. No new lesions.   PLAN:  1. Stage IV adenocarcinoma lung, PD-L1 50%, other actionable mutations negative: -He is currently taking prednisone 20 mg daily.  His bowel movements improved to 1 soft BM in the morning. -We will continue to hold Keytruda at this time. -Recommended CT chest with contrast in 2 weeks. -If we are able to taper prednisone below 10 mg in the next 2 weeks without resurgence of diarrhea, we may plan to reintroduce immunotherapy.  2. Brain metastasis: -No headaches or vision changes reported.  3.CKD: -Creatinine improved to 1.42.  4. Peripheral neuropathy: -Continue gabapentin and duloxetine.  5. Hypomagnesemia: -Continue magnesium twice daily.  6. Immunotherapy related colitis: -I have told him to cut back to half pill of prednisone (10 mg) on 02/07/2020.  However he is continuing to take 20 mg daily. -His diarrhea has improved.  He is only having 1 bowel movement, soft daily. -Decrease dose to prednisone 10 mg daily for 7 days followed by 10 mg every other day. -RTC 2 weeks.   Orders placed this encounter:  Orders Placed This Encounter  Procedures  . CT Chest W Contrast     Derek Jack, MD [place] [phone]   I, Milinda Antis, am acting as a scribe for Dr. Sanda Linger.  I, Derek Jack MD, have reviewed the above documentation for accuracy and completeness, and I agree with the above.

## 2020-03-01 ENCOUNTER — Other Ambulatory Visit (HOSPITAL_COMMUNITY): Payer: Self-pay | Admitting: Hematology

## 2020-03-07 ENCOUNTER — Other Ambulatory Visit: Payer: Self-pay

## 2020-03-07 ENCOUNTER — Ambulatory Visit (HOSPITAL_COMMUNITY)
Admission: RE | Admit: 2020-03-07 | Discharge: 2020-03-07 | Disposition: A | Discharge status: Home / Self Care | Payer: Medicare Other | Source: Ambulatory Visit | Attending: Hematology | Admitting: Hematology

## 2020-03-07 DIAGNOSIS — M19012 Primary osteoarthritis, left shoulder: Secondary | ICD-10-CM | POA: Diagnosis not present

## 2020-03-07 DIAGNOSIS — I700 Atherosclerosis of aorta: Secondary | ICD-10-CM | POA: Diagnosis not present

## 2020-03-07 DIAGNOSIS — I2510 Atherosclerotic heart disease of native coronary artery without angina pectoris: Secondary | ICD-10-CM | POA: Diagnosis not present

## 2020-03-07 DIAGNOSIS — C3411 Malignant neoplasm of upper lobe, right bronchus or lung: Secondary | ICD-10-CM | POA: Insufficient documentation

## 2020-03-07 DIAGNOSIS — C3490 Malignant neoplasm of unspecified part of unspecified bronchus or lung: Secondary | ICD-10-CM | POA: Diagnosis not present

## 2020-03-07 MED ORDER — IOHEXOL 300 MG/ML  SOLN
75.0000 mL | Freq: Once | INTRAMUSCULAR | Status: AC | PRN
  Administered 2020-03-07: 60 mL via INTRAVENOUS

## 2020-03-08 ENCOUNTER — Inpatient Hospital Stay (HOSPITAL_COMMUNITY): Payer: Medicare Other | Attending: Hematology | Admitting: Hematology

## 2020-03-08 ENCOUNTER — Other Ambulatory Visit: Payer: Self-pay

## 2020-03-08 ENCOUNTER — Inpatient Hospital Stay (HOSPITAL_COMMUNITY): Payer: Medicare Other

## 2020-03-08 VITALS — BP 117/54 | HR 54 | Temp 97.2°F | Resp 20 | Wt 211.2 lb

## 2020-03-08 DIAGNOSIS — E876 Hypokalemia: Secondary | ICD-10-CM | POA: Diagnosis not present

## 2020-03-08 DIAGNOSIS — E1122 Type 2 diabetes mellitus with diabetic chronic kidney disease: Secondary | ICD-10-CM | POA: Diagnosis not present

## 2020-03-08 DIAGNOSIS — C7931 Secondary malignant neoplasm of brain: Secondary | ICD-10-CM | POA: Insufficient documentation

## 2020-03-08 DIAGNOSIS — I129 Hypertensive chronic kidney disease with stage 1 through stage 4 chronic kidney disease, or unspecified chronic kidney disease: Secondary | ICD-10-CM | POA: Diagnosis not present

## 2020-03-08 DIAGNOSIS — N189 Chronic kidney disease, unspecified: Secondary | ICD-10-CM | POA: Diagnosis not present

## 2020-03-08 DIAGNOSIS — C3411 Malignant neoplasm of upper lobe, right bronchus or lung: Secondary | ICD-10-CM | POA: Insufficient documentation

## 2020-03-08 DIAGNOSIS — Z79899 Other long term (current) drug therapy: Secondary | ICD-10-CM | POA: Diagnosis not present

## 2020-03-08 LAB — COMPREHENSIVE METABOLIC PANEL
ALT: 16 U/L (ref 0–44)
AST: 15 U/L (ref 15–41)
Albumin: 3.2 g/dL — ABNORMAL LOW (ref 3.5–5.0)
Alkaline Phosphatase: 44 U/L (ref 38–126)
Anion gap: 11 (ref 5–15)
BUN: 19 mg/dL (ref 8–23)
CO2: 23 mmol/L (ref 22–32)
Calcium: 8.2 mg/dL — ABNORMAL LOW (ref 8.9–10.3)
Chloride: 97 mmol/L — ABNORMAL LOW (ref 98–111)
Creatinine, Ser: 1.62 mg/dL — ABNORMAL HIGH (ref 0.61–1.24)
GFR, Estimated: 44 mL/min — ABNORMAL LOW (ref >60)
Glucose, Bld: 121 mg/dL — ABNORMAL HIGH (ref 70–99)
Potassium: 2.6 mmol/L — CL (ref 3.5–5.1)
Sodium: 131 mmol/L — ABNORMAL LOW (ref 135–145)
Total Bilirubin: 1 mg/dL (ref 0.3–1.2)
Total Protein: 6.4 g/dL — ABNORMAL LOW (ref 6.5–8.1)

## 2020-03-08 LAB — CBC WITH DIFFERENTIAL/PLATELET
Abs Immature Granulocytes: 0.17 10*3/uL — ABNORMAL HIGH (ref 0.00–0.07)
Basophils Absolute: 0.1 10*3/uL (ref 0.0–0.1)
Basophils Relative: 1 %
Eosinophils Absolute: 0 10*3/uL (ref 0.0–0.5)
Eosinophils Relative: 0 %
HCT: 41.1 % (ref 39.0–52.0)
Hemoglobin: 13.7 g/dL (ref 13.0–17.0)
Immature Granulocytes: 2 %
Lymphocytes Relative: 16 %
Lymphs Abs: 1.8 10*3/uL (ref 0.7–4.0)
MCH: 29.7 pg (ref 26.0–34.0)
MCHC: 33.3 g/dL (ref 30.0–36.0)
MCV: 89.2 fL (ref 80.0–100.0)
Monocytes Absolute: 0.9 10*3/uL (ref 0.1–1.0)
Monocytes Relative: 8 %
Neutro Abs: 8.3 10*3/uL — ABNORMAL HIGH (ref 1.7–7.7)
Neutrophils Relative %: 73 %
Platelets: 216 10*3/uL (ref 150–400)
RBC: 4.61 MIL/uL (ref 4.22–5.81)
RDW: 15.2 % (ref 11.5–15.5)
WBC: 11.3 10*3/uL — ABNORMAL HIGH (ref 4.0–10.5)
nRBC: 0 % (ref 0.0–0.2)

## 2020-03-08 LAB — MAGNESIUM: Magnesium: 1.8 mg/dL (ref 1.7–2.4)

## 2020-03-08 MED ORDER — POTASSIUM CHLORIDE CRYS ER 20 MEQ PO TBCR
40.0000 meq | EXTENDED_RELEASE_TABLET | Freq: Once | ORAL | Status: AC
  Administered 2020-03-08: 40 meq via ORAL

## 2020-03-08 MED ORDER — POTASSIUM CHLORIDE CRYS ER 20 MEQ PO TBCR
EXTENDED_RELEASE_TABLET | ORAL | Status: AC
  Filled 2020-03-08: qty 2

## 2020-03-08 MED ORDER — DIPHENOXYLATE-ATROPINE 2.5-0.025 MG PO TABS: ORAL_TABLET | ORAL | 2 refills | Status: AC

## 2020-03-08 NOTE — Progress Notes (Signed)
Patient seen today by Dr. Delton Coombes.  Orders received for potassium 40 mEq by mouth times once today.  Patient to come tomorrow for house fluids and potassium 40 mEq by mouth times one additional dose.

## 2020-03-08 NOTE — Progress Notes (Signed)
[facility] [address]  [address]   CLINIC:  Medical Oncology/Hematology  PCP:  Celene Squibb, MD [address] [place] [address] [phone]   REASON FOR VISIT:  Follow-up for stage IV adenocarcinoma of right lung  PRIOR THERAPY:  1. Keytruda from 05/17/2016 to 09/24/2018. 2. Carboplatin & pemetrexed x 4 cycles from 12/25/2018 to 03/03/2019. 3. Keytruda x 17 cycles from 12/16/2018 to 12/09/2019.  NGS Results: PD-L1 50%  CURRENT THERAPY: Observation  BRIEF ONCOLOGIC HISTORY:  Oncology History  Primary cancer of right upper lobe of lung ([facility])  05/14/2015 Procedure   Port placement by Dr. Arnoldo Morale   04/01/2016 Initial Diagnosis   Primary cancer of right upper lobe of lung ([facility])   04/02/2016 Procedure   Ultrasound-guided core biopsy performed of a solid 1.5 cm soft tissue nodule in the right chest wall.   04/03/2016 Imaging   MRI brain- No acute and intracranial process or metastasis.  Moderate chronic small vessel ischemic disease.   04/04/2016 Pathology Results   Diagnosis Soft Tissue Needle Core Biopsy, Right Chest Wall SMOOTH MUSCLE NEOPLASM Microscopic Comment The differential diagnosis include low grade leiomyosarcoma. The neoplasm shows spindle cell morphology with rare mitotic figures and minimal atypia, no necrosis present. These cells stains positive for smooth muscle actin, desmin, negative for cytokeratin AE1&3, ck8/18, s100, cd117, cd 34 and cd99. This case also reviewed by Dr. [name] and agree.   04/05/2016 Procedure   CT-guided biopsy of right upper lobe lesion, with tissue specimen sent to pathology, by IR.   04/09/2016 Pathology Results   Lung, needle/core biopsy(ies), Right Upper Lobe - NON SMALL CELL CARCINOMA.   04/17/2016 Pathology Results   PDL1 High Expression.  TPS 50%.   04/24/2016 PET scan   1. Prominently hypermetabolic large right upper lobe mass with metastatic disease to the right  paratracheal and right hilar nodal chains, to the descending mesocolon, to the left upper buttock subcutaneous tissues, to the left upper lobe, to the right subscapularis muscle, and possibly to the left adrenal gland and right breast subcutaneous tissues. Appearance compatible with stage IV lung cancer. 2. There is some focal high activity in the ascending colon which is probably from peristalsis, less likely from a local colon mass. This does raise the possibility that the adjacent mesocolon tumor implant could be due to synchronous colon cancer rather than lung metastasis. 3. Other imaging findings of potential clinical significance: Coronary, aortic arch, and branch vessel atherosclerotic vascular disease. Aortoiliac atherosclerotic vascular disease. Deformity left proximal humerus and left glenohumeral joint from prior trauma. Enlarged prostate gland.   04/26/2016 Pathology Results   FoundationONE: Genomic alterations identified- KRAS V69V, CREBBP splice site [id number], LRP1B S515*, VVZ48 O70*, BEM75 splice site [id number], SPTA1 splice site [id number]>[id number], TP53 C135Y.  Additional findings- MS-Stable, TMB-intermediate (14 Muts/Mb).  No reportable alterations- EGFR, ALK, BRAF, MET, ERBB2, RET, ROS1.   05/17/2016 -  Chemotherapy   The patient had ONDANSETRON IVPB [facility] +/- DEXAMETHASONE, , Intravenous,  Once, 0 of 1 cycle  pembrolizumab (KEYTRUDA) 200 mg in sodium chloride 0.9 % 50 mL chemo infusion, 200 mg, Intravenous, Once, 0 of 5 cycles  for chemotherapy treatment.     08/06/2016 PET scan   IMPRESSION: 1. Overall considerable improvement. The right upper lobe mass is markedly reduced in volume and SUV. Thoracic adenopathy have resolved and the hypermetabolic lesion in the right subscapularis muscle has also resolved. 2. The left upper lobe nodule is no longer appreciably  hypermetabolic, and is highly indistinct although this may be due to motion artifact. Faint in indistinct  nodularity elsewhere in the lungs likewise not currently hypermetabolic. 3. There is some very faint residual low-grade activity along the subcutaneous deposit along the upper left buttock region. Marked reduction in size and activity of the tumor deposit adjacent to the descending colon. 4. Prior right pleural effusion has resolved. 5. Stable appearance of the adrenal glands, with low-level activity and a nodule in the left adrenal gland. 6. Stable small nodule in the right breast, low-grade metabolic activity with maximum SUV 2.2 (formerly 3.0) 7. Other imaging findings of potential clinical significance: Severe arthropathy of the left glenohumeral joint. Coronary, aortic arch, and branch vessel atherosclerotic vascular disease. Aortoiliac atherosclerotic vascular disease. Prominent prostate gland indents the bladder base.    12/16/2018 -  Chemotherapy   The patient had palonosetron (ALOXI) injection 0.25 mg, 0.25 mg, Intravenous,  Once, 4 of 4 cycles Administration: 0.25 mg (12/16/2018), 0.25 mg (01/27/2019), 0.25 mg (03/03/2019), 0.25 mg (01/06/2019) pegfilgrastim (NEULASTA) injection 6 mg, 6 mg, Subcutaneous, Once, 2 of 2 cycles Administration: 6 mg (01/08/2019) pegfilgrastim (NEULASTA ONPRO KIT) injection 6 mg, 6 mg, Subcutaneous, Once, 2 of 2 cycles pegfilgrastim-cbqv (UDENYCA) injection 6 mg, 6 mg, Subcutaneous, Once, 2 of 2 cycles Administration: 6 mg (01/29/2019), 6 mg (03/05/2019) ondansetron (ZOFRAN) 8 mg in sodium chloride 0.9 % 50 mL IVPB, 8 mg (100 % of original dose 8 mg), Intravenous,  Once, 3 of 3 cycles Dose modification: 8 mg (original dose 8 mg, Cycle 5) Administration: 8 mg (03/24/2019), 8 mg (04/16/2019) PEMEtrexed (ALIMTA) 1,100 mg in sodium chloride 0.9 % 100 mL chemo infusion, 490 mg/m2 = 1,125 mg, Intravenous,  Once, 6 of 6 cycles Dose modification: 400 mg/m2 (80 % of original dose 500 mg/m2, Cycle 3, Reason: Other (see comments), Comment: rash,  swelling) Administration: 1,100 mg (12/16/2018), 900 mg (01/27/2019), 900 mg (03/03/2019), 900 mg (03/24/2019), 900 mg (04/16/2019), 1,100 mg (01/06/2019) CARBOplatin (PARAPLATIN) 500 mg in sodium chloride 0.9 % 250 mL chemo infusion, 530 mg (110.7 % of original dose 477.5 mg), Intravenous,  Once, 4 of 4 cycles Dose modification:   (original dose 477.5 mg, Cycle 1),   (original dose 485.5 mg, Cycle 3),   (original dose 494 mg, Cycle 4),   (original dose 494 mg, Cycle 2) Administration: 500 mg (12/16/2018), 490 mg (01/27/2019), 490 mg (03/03/2019), 490 mg (01/06/2019) pembrolizumab (KEYTRUDA) 200 mg in sodium chloride 0.9 % 50 mL chemo infusion, 2 mg/kg = 200 mg, Intravenous, Once, 12 of 13 cycles Dose modification: 200 mg (original dose 2 mg/kg, Cycle 14, Reason: Provider Judgment) Administration: 200 mg (04/16/2019), 200 mg (05/31/2019), 200 mg (06/22/2019), 200 mg (07/13/2019), 200 mg (05/10/2019), 200 mg (08/03/2019), 200 mg (08/24/2019), 200 mg (09/14/2019), 200 mg (10/08/2019), 200 mg (10/29/2019), 200 mg (11/18/2019), 200 mg (12/09/2019) fosaprepitant (EMEND) 150 mg, dexamethasone (DECADRON) 12 mg in sodium chloride 0.9 % 145 mL IVPB, , Intravenous,  Once, 4 of 4 cycles Administration:  (12/16/2018),  (01/27/2019),  (03/03/2019),  (01/06/2019)  for chemotherapy treatment.    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  Two adjacent large masslike areas of consolidation within the right upper lobe with differential considerations including malignancy or pneumonia. Multiple enlarged right hilar and mediastinal lymph nodes which may be reactive or metastatic in etiology.  Additional indeterminate pulmonary nodules as above. Recommend attention  on follow-up.  Indeterminate 2.9 cm left adrenal nodule. This needs dedicated evaluation with pre and post contrast-enhanced CT or MRI  after confirmation of pulmonary process.  Indeterminate nodule within the right breast. Recommend dedicated evaluation with mammography.  Hepatic steatosis.   04/02/2016 Procedure   Ultrasound-guided core biopsy performed of a solid 1.5 cm soft tissue nodule in the right chest wall.   04/04/2016 Pathology Results   Diagnosis Soft Tissue Needle Core Biopsy, Right Chest Wall SMOOTH MUSCLE NEOPLASM Microscopic Comment The differential diagnosis include low grade leiomyosarcoma. The neoplasm shows spindle cell morphology with rare mitotic figures and minimal atypia, no necrosis present. These cells stains positive for smooth muscle actin, desmin, negative for cytokeratin AE1&3, ck8/18, s100, cd117, cd 34 and cd99. This case also reviewed by Dr. [name] and agree.      CANCER STAGING: Cancer Staging Primary cancer of right upper lobe of lung ([facility]) Staging form: Lung, AJCC 8th Edition - Clinical: Stage IVB (cT3, cN2, cM1c) - Signed by Baird Cancer, PA-C on 05/07/2016   INTERVAL HISTORY:  Darrell Christensen, a 74 y.o. male, returns for routine follow-up of his stage IV adenocarcinoma of right lung. Darrell Christensen was last seen on 02/16/2020.   Today he reports feeling okay. He reports that the Remicade did not make a difference with his diarrhea. His diarrhea has returned with 10-15 episodes daily. He finished his prednisone on 10/31. He is taking Imodium 2 tablets BID.   REVIEW OF SYSTEMS:  Review of Systems  Constitutional: Positive for appetite change (depleted) and fatigue (depleted).  Respiratory: Positive for shortness of breath (w/ exertion).   Gastrointestinal: Positive for abdominal pain (9/10 abdominal pain) and diarrhea.  Genitourinary: Positive for frequency.   Neurological: Positive for dizziness, headaches and numbness (hands & feet).  All other systems reviewed and are negative.   PAST MEDICAL/SURGICAL HISTORY:  Past Medical History:  Diagnosis Date  . Adrenal  mass, left ([facility]) 04/01/2016  . Diabetes mellitus ([facility]) 05/02/2016  . Diabetes mellitus without complication ([facility])   . Hypertension   . Mass of breast, right 04/01/2016  . Mass of upper lobe of right lung 04/01/2016   2 adjacent, 5 cm masses, hilar adenopathy  . Primary cancer of right upper lobe of lung ([facility]) 04/01/2016   2 adjacent, 5 cm masses, hilar adenopathy  . Pulmonary embolus ([facility]) 03/31/2016  . 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 COLONOSCOPY N/A 07/28/2017   Procedure: COLONOSCOPY;  Surgeon: Rogene Houston, MD;  Location: [facility] ENDO SUITE;  Service: Endoscopy;  Laterality: N/A;  205  . PORTACATH PLACEMENT Left 05/13/2016   Procedure: INSERTION PORT-A-CATH;  Surgeon: Aviva Signs, MD;  Location: [facility] ORS;  Service: General;  Laterality: Left;    SOCIAL HISTORY:  Social History   Socioeconomic History  . Marital status: Divorced    Spouse name: Not on file  . Number of children: Not on file  . Years of education: Not on file  . Highest education level: Not on file  Occupational History  . Not on file  Tobacco Use  . Smoking status: Former Smoker    Years: 61.00    Quit date: 04/11/2014    Years since quitting: 5.9  . Smokeless tobacco: Never Used  . Tobacco comment: patient does vape smoking x 2 years  Vaping Use  . Vaping Use: Every day  Substance and Sexual Activity  . Alcohol use: Not Currently  Comment: quit 10 years  . Drug use: No  . Sexual activity: Not on file  Other Topics Concern  . Not on file  Social History Narrative  . Not on file   Social Determinants of Health   Financial Resource Strain: High Risk  . Difficulty of Paying Living Expenses: Very hard  Food Insecurity: No Food Insecurity  . Worried About Charity fundraiser in the Last Year: Never true  . Ran Out of Food in the Last Year: Never true  Transportation Needs: No Transportation Needs  . Lack of Transportation (Medical): No  . Lack of  Transportation (Non-Medical): No  Physical Activity: Inactive  . Days of Exercise per Week: 0 days  . Minutes of Exercise per Session: 0 min  Stress: No Stress Concern Present  . Feeling of Stress : Not at all  Social Connections: Socially Isolated  . Frequency of Communication with Friends and Family: Never  . Frequency of Social Gatherings with Friends and Family: Never  . Attends Religious Services: Never  . Active Member of Clubs or Organizations: No  . Attends Archivist Meetings: Never  . Marital Status: Divorced  Human resources officer Violence: Not At Risk  . Fear of Current or Ex-Partner: No  . Emotionally Abused: No  . Physically Abused: No  . Sexually Abused: No    FAMILY HISTORY:  Family History  Problem Relation Age of Onset  . Stroke Mother   . Heart attack Father   . Heart attack Brother     CURRENT MEDICATIONS:  Current Outpatient Medications  Medication Sig Dispense Refill  . ALPRAZolam (XANAX) 1 MG tablet Take 1 mg by mouth 2 (two) times daily as needed for anxiety.    Marland Kitchen amLODipine (NORVASC) 2.5 MG tablet Take 2.5 mg by mouth daily.  4  . diphenoxylate-atropine (LOMOTIL) 2.5-0.025 MG tablet Take 2 tablets 4 times per day. 240 tablet 2  . DULoxetine (CYMBALTA) 60 MG capsule Take 1 capsule (60 mg total) by mouth daily. 30 capsule 6  . famotidine (PEPCID) 20 MG tablet Take 1 tablet (20 mg total) by mouth daily. 30 tablet 2  . folic acid (FOLVITE) 1 MG tablet TAKE ONE TABLET BY MOUTH ONCE DAILY. START 5-7 DAYS BEFORE ALIMTA CHEMOTHERAPY AND CONTINUE FOR 21 DAYS AFTER CHEMOTHERAPY. 100 tablet 0  . furosemide (LASIX) 20 MG tablet Take 2 tablets (40 mg total) by mouth daily. 20 tablet 0  . gabapentin (NEURONTIN) 400 MG capsule Take 1 capsule (400 mg total) by mouth 3 (three) times daily. 90 capsule 3  . GLIPIZIDE XL 5 MG 24 hr tablet Take 5 mg by mouth daily.  4  . hydrocortisone 2.5 % lotion Apply topically as needed.     . lactulose (CHRONULAC) 10 GM/15ML  solution Take 30 mLs (20 g total) by mouth at bedtime as needed for mild constipation. 480 mL 2  . levocetirizine (XYZAL) 5 MG tablet Take 5 mg by mouth at bedtime.    . magnesium oxide (MAG-OX) 400 (241.3 Mg) MG tablet Take 1 tablet (400 mg total) by mouth 2 (two) times daily. 60 tablet 2  . metFORMIN (GLUCOPHAGE) 500 MG tablet Take 500 mg by mouth 2 (two) times daily.  4  . metoprolol succinate (TOPROL-XL) 25 MG 24 hr tablet TAKE ONE TABLET BY MOUTH IN THE EVENING. 30 tablet 0  . oxyCODONE-acetaminophen (PERCOCET) 10-325 MG tablet Take 1 tablet by mouth as needed.   0  . Pembrolizumab (KEYTRUDA IV) Inject into the  vein.    . potassium chloride (K-DUR,KLOR-CON) 10 MEQ tablet Take 10 mEq by mouth daily.   0  . predniSONE (DELTASONE) 20 MG tablet Take 2.5 tablets (50 mg total) by mouth daily with breakfast. 70 tablet 0  . simvastatin (ZOCOR) 20 MG tablet Take 20 mg by mouth every evening.  5  . torsemide (DEMADEX) 20 MG tablet Take 1 tablet (20 mg total) by mouth daily. 30 tablet 1  . lidocaine-prilocaine (EMLA) cream APPLY A QUARTER SIZE AMOUNT TO THE AFFECTED AREA 1 HOUR PRIOR TO COMING TO CHEMOTHERAPY. COVER WITH A PLASTIC WRAP. (Patient not taking: Reported on 03/08/2020) 30 g 0  . ondansetron (ZOFRAN) 4 MG tablet Take 1 tablet (4 mg total) by mouth every 8 (eight) hours as needed for nausea or vomiting. (Patient not taking: Reported on 03/08/2020) 4 tablet 0  . prochlorperazine (COMPAZINE) 10 MG tablet Take 1 tablet (10 mg total) by mouth every 6 (six) hours as needed (Nausea or vomiting). (Patient not taking: Reported on 03/08/2020) 30 tablet 1   No current facility-administered medications for this visit.    ALLERGIES:  No Known Allergies  PHYSICAL EXAM:  Performance status (ECOG): 1 - Symptomatic but completely ambulatory  Vitals:   03/08/20 1527  BP: (!) 117/54  Pulse: (!) 54  Resp: 20  Temp: (!) 97.2 F (36.2 C)  SpO2: 94%   Wt Readings from Last 3 Encounters:  03/08/20 211 lb  3.2 oz (95.8 kg)  02/16/20 220 lb 10.9 oz (100.1 kg)  02/02/20 226 lb (102.5 kg)   Physical Exam Vitals reviewed.  Constitutional:      Appearance: Normal appearance. He is obese.  Neurological:     General: No focal deficit present.     Mental Status: He is alert and oriented to person, place, and time.  Psychiatric:        Mood and Affect: Mood normal.        Behavior: Behavior normal.      LABORATORY DATA:  I have reviewed the labs as listed.  CBC Latest Ref Rng & Units 03/08/2020 02/16/2020 02/02/2020  WBC 4.0 - 10.5 K/uL 11.3(H) 11.4(H) 10.9(H)  Hemoglobin 13.0 - 17.0 g/dL 13.7 14.7 14.6  Hematocrit 39 - 52 % 41.1 45.3 45.6  Platelets 150 - 400 K/uL 216 215 179   CMP Latest Ref Rng & Units 03/08/2020 02/16/2020 02/02/2020  Glucose 70 - 99 mg/dL 121(H) 157(H) 225(H)  BUN 8 - 23 mg/dL 19 26(H) 22  Creatinine 0.61 - 1.24 mg/dL 1.62(H) 1.42(H) 1.49(H)  Sodium 135 - 145 mmol/L 131(L) 137 139  Potassium 3.5 - 5.1 mmol/L 2.6(LL) 4.8 3.8  Chloride 98 - 111 mmol/L 97(L) 99 102  CO2 22 - 32 mmol/L '23 28 26  ' Calcium 8.9 - 10.3 mg/dL 8.2(L) 8.8(L) 8.5(L)  Total Protein 6.5 - 8.1 g/dL 6.4(L) 6.4(L) 6.0(L)  Total Bilirubin 0.3 - 1.2 mg/dL 1.0 0.5 0.8  Alkaline Phos 38 - 126 U/L 44 57 58  AST 15 - 41 U/L '15 16 19  ' ALT 0 - 44 U/L '16 24 27    ' DIAGNOSTIC IMAGING:  I have independently reviewed the scans and discussed with the patient. CT Chest W Contrast  Result Date: 03/07/2020 CLINICAL DATA:  Non-small-cell lung cancer.  Restaging. EXAM: CT CHEST WITH CONTRAST TECHNIQUE: Multidetector CT imaging of the chest was performed during intravenous contrast administration. CONTRAST:  105m OMNIPAQUE IOHEXOL 300 MG/ML  SOLN COMPARISON:  12/29/2019 FINDINGS: Cardiovascular: The heart size is normal. No substantial  pericardial effusion. Coronary artery calcification is evident. Atherosclerotic calcification is noted in the wall of the thoracic aorta. Ascending thoracic aorta measures 4 cm diameter.  Left Port-A-Cath tip is positioned in the proximal SVC. Mediastinum/Nodes: 14 mm right thyroid nodule is stable. Tiny scattered mediastinal lymph nodes are similar to prior. 12 mm short axis right hilar node measured previously is 11 mm today. Small subcarinal node measured previously is stable. No left axillary adenopathy. The esophagus has normal imaging features. There is no axillary lymphadenopathy. Lungs/Pleura: Posterior right upper lobe pulmonary mass measures 3.3 x 1.4 cm today, decreased from 4.0 x 2.1 cm previously. Stable 6 mm subpleural nodule anterior right hemithorax. Additional scattered tiny pulmonary nodules are similar. No new suspicious pulmonary nodule or mass. Chronic underlying interstitial changes again noted. No pleural effusion. Upper Abdomen: 2.6 x 1.7 cm left adrenal nodule was 2.8 x 1.6 cm previously. Musculoskeletal: No worrisome lytic or sclerotic osseous abnormality. Advanced degenerative changes noted left shoulder, incompletely visualized. IMPRESSION: 1. Interval decrease in size of the posterior right upper lobe pulmonary mass. 2. Stable to slight decrease in size of the left adrenal nodule. 3. Stable 14 mm right thyroid nodule. Not clinically significant; no follow-up imaging recommended (ref: J Am Coll Radiol. 2015 Feb;12(2): 143-50). 4. Aortic Atherosclerosis (ICD10-I70.0). Electronically Signed   By: Misty Stanley M.D.   On: 03/07/2020 14:25     ASSESSMENT:  1. Stage IV adenocarcinoma lung, PD-L1 50%, other actionable mutations negative: --Keytruda from 05/17/2016 through 09/24/2018 with mild progression. -4 cycles of carboplatin, pemetrexed from 12/25/2018 through 03/03/2019. -Pemetrexed maintenance from 03/24/2019 through 04/12/2019 discontinued secondary to renal insufficiency. -CT chest with contrast from 09/30/2019 shows right upper lobe lung lesion measuring 3.8 x 2.3)(4.2 x 2.6 cm). No change in the left upper lobe nodule. Subcarinal lymph node measures 11 mm,  previously 7 mm. No definite left hilar adenopathy. Stable left adrenal nodule. -CT chest on 12/29/2019 shows 4 cm posterior right upper lobe mass grossly unchanged. Stable 12 mm right hilar node. Stable 2.8 cm left adrenal nodule. -Pembrolizumab held since 12/30/2019 for diarrhea. Last dose was on 12/09/2019. -Remicade 5 mg/kg on 01/27/2020.  2. Brain metastasis: -SRS to the right parietal lobe lesion with surrounding edema on 11/24/2018. -MRI of the brain on 09/16/2019 shows stable treated right parietal metastasis. No new mass or abnormal enhancement. -MRI of the brain on 12/23/2019 shows unchanged 6 mm treated enhancing lesion in the right parietal lobe. No new lesions.   PLAN:  1. Stage IV adenocarcinoma lung, PD-L1 50%, other actionable mutations negative: -His immunotherapy is on hold for colitis. -Reviewed CT chest with contrast from 03/07/2020 which showed interval decrease in size of the posterior right upper lobe lung mass.  Stable to slight decrease in size of the left adrenal nodule. -We will continue to hold immunotherapy until his colitis resolves.  2. Brain metastasis: -No headaches or vision changes.  3.CKD: -Creatinine has increased to 1.62.  We will give him hydration.  4. Peripheral neuropathy: -Continue gabapentin and duloxetine.  5. Hypomagnesemia: -Magnesium today is 1.8.  Continue magnesium twice daily.  6. Immunotherapy related colitis: -He received first dose of Remicade on 01/27/2020.  He completed prednisone 10 mg daily on Sunday and ran out of the pills and stopped taking it.  He reported that he has been having 10-15 stools per day in the last couple of weeks.  He is taking 2 tablets of Lomotil twice daily.  He even ran out of Lomotil. -He was told  to take Lomotil 2 tablets 4 times a day. -I plan to give him second dose of Remicade 5 mg/kg tomorrow.  7.  Hypokalemia: -Potassium today is 2.6.  Likely from diarrhea. -We have given 40 mEq of  potassium p.o. today in the clinic.  He'll come back tomorrow for fluids and IV potassium.   Orders placed this encounter:  No orders of the defined types were placed in this encounter.    Derek Jack, MD [place] [phone]   I, Milinda Antis, am acting as a scribe for Dr. Sanda Linger.  I, Derek Jack MD, have reviewed the above documentation for accuracy and completeness, and I agree with the above.

## 2020-03-08 NOTE — Patient Instructions (Signed)
[place] at [facility] Discharge Instructions  You were seen today by Dr. Delton Coombes. He went over your recent results and scans. You will receive fluids and Remicade infusion tomorrow for your diarrhea. Take 2 tablets of Lomotil four times daily until your diarrhea stops. Dr. Delton Coombes will see you back in 1 week for labs and follow up.   Thank you for choosing [place] at [facility] to provide your oncology and hematology care.  To afford each patient quality time with our provider, please arrive at least 15 minutes before your scheduled appointment time.   If you have a lab appointment with the [facility] please come in thru the Main Entrance and check in at the main information desk  You need to re-schedule your appointment should you arrive 10 or more minutes late.  We strive to give you quality time with our providers, and arriving late affects you and other patients whose appointments are after yours.  Also, if you no show three or more times for appointments you may be dismissed from the clinic at the providers discretion.     Again, thank you for choosing [facility].  Our hope is that these requests will decrease the amount of time that you wait before being seen by our physicians.       _____________________________________________________________  Should you have questions after your visit to [facility], please contact our office at [phone] between the hours of 8:00 a.m. and 4:30 p.m.  Voicemails left after 4:00 p.m. will not be returned until the following business day.  For prescription refill requests, have your pharmacy contact our office and allow 72 hours.    Cancer Center Support Programs:   > Cancer Support Group  2nd Tuesday of the month 1pm-2pm, Journey Room

## 2020-03-09 ENCOUNTER — Other Ambulatory Visit: Payer: Self-pay

## 2020-03-09 ENCOUNTER — Inpatient Hospital Stay (HOSPITAL_COMMUNITY): Payer: Medicare Other

## 2020-03-09 VITALS — BP 123/75 | Temp 96.8°F | Resp 20

## 2020-03-09 DIAGNOSIS — C3411 Malignant neoplasm of upper lobe, right bronchus or lung: Secondary | ICD-10-CM | POA: Diagnosis not present

## 2020-03-09 DIAGNOSIS — E860 Dehydration: Secondary | ICD-10-CM

## 2020-03-09 MED ORDER — POTASSIUM CHLORIDE CRYS ER 20 MEQ PO TBCR
40.0000 meq | EXTENDED_RELEASE_TABLET | Freq: Once | ORAL | Status: AC
  Administered 2020-03-09: 40 meq via ORAL
  Filled 2020-03-09: qty 2

## 2020-03-09 MED ORDER — SODIUM CHLORIDE 0.9 % IV SOLN
Freq: Once | INTRAVENOUS | Status: AC
  Filled 2020-03-09: qty 1000

## 2020-03-09 MED ORDER — HEPARIN SOD (PORK) LOCK FLUSH 100 UNIT/ML IV SOLN
500.0000 [IU] | Freq: Once | INTRAVENOUS | Status: AC
  Administered 2020-03-09: 500 [IU] via INTRAVENOUS

## 2020-03-09 NOTE — Progress Notes (Signed)
Tolerated infusion w/o adverse reaction.  Alert, in no distress.  Discharged ambulatory in stable condition.

## 2020-03-10 ENCOUNTER — Encounter (HOSPITAL_COMMUNITY): Payer: Self-pay

## 2020-03-10 ENCOUNTER — Encounter (HOSPITAL_COMMUNITY)
Admission: RE | Admit: 2020-03-10 | Discharge: 2020-03-10 | Disposition: A | Discharge status: Home / Self Care | Payer: Medicare Other | Source: Ambulatory Visit | Attending: Hematology | Admitting: Hematology

## 2020-03-10 DIAGNOSIS — C3411 Malignant neoplasm of upper lobe, right bronchus or lung: Secondary | ICD-10-CM | POA: Insufficient documentation

## 2020-03-10 MED ORDER — SODIUM CHLORIDE 0.9 % IV SOLN: Freq: Once | INTRAVENOUS | Status: AC

## 2020-03-10 MED ORDER — ACETAMINOPHEN 325 MG PO TABS: 650.0000 mg | ORAL_TABLET | Freq: Once | ORAL | Status: DC

## 2020-03-10 MED ORDER — EPINEPHRINE 1 MG/10ML IJ SOSY: 0.2500 mg | PREFILLED_SYRINGE | Freq: Once | INTRAMUSCULAR | Status: AC | PRN

## 2020-03-10 MED ORDER — DIPHENHYDRAMINE HCL 50 MG/ML IJ SOLN: 25.0000 mg | Freq: Once | INTRAMUSCULAR | Status: DC | PRN

## 2020-03-10 MED ORDER — DIPHENHYDRAMINE HCL 50 MG/ML IJ SOLN: 50.0000 mg | Freq: Once | INTRAMUSCULAR | Status: DC | PRN

## 2020-03-10 MED ORDER — SODIUM CHLORIDE 0.9 % IV SOLN: 5.0000 mg/kg | Freq: Once | INTRAVENOUS | Status: DC

## 2020-03-10 MED ORDER — HEPARIN SOD (PORK) LOCK FLUSH 100 UNIT/ML IV SOLN
500.0000 [IU] | Freq: Once | INTRAVENOUS | Status: AC
  Administered 2020-03-10: 500 [IU] via INTRAVENOUS

## 2020-03-10 MED ORDER — SODIUM CHLORIDE 0.9 % IV SOLN
5.0000 mg/kg | Freq: Once | INTRAVENOUS | Status: AC
  Administered 2020-03-10: 500 mg via INTRAVENOUS
  Filled 2020-03-10: qty 50

## 2020-03-10 MED ORDER — ALBUTEROL SULFATE (2.5 MG/3ML) 0.083% IN NEBU: 2.5000 mg | INHALATION_SOLUTION | Freq: Once | RESPIRATORY_TRACT | Status: AC | PRN

## 2020-03-10 MED ORDER — METHYLPREDNISOLONE SODIUM SUCC 125 MG IJ SOLR: 125.0000 mg | Freq: Once | INTRAMUSCULAR | Status: DC | PRN

## 2020-03-10 MED ORDER — ACETAMINOPHEN 325 MG PO TABS
650.0000 mg | ORAL_TABLET | Freq: Once | ORAL | Status: AC
  Administered 2020-03-10: 650 mg via ORAL
  Filled 2020-03-10: qty 2

## 2020-03-10 MED ORDER — SODIUM CHLORIDE 0.9 % IV SOLN: Freq: Once | INTRAVENOUS | Status: AC | PRN

## 2020-03-10 NOTE — Progress Notes (Signed)
CRITICAL VALUE ALERT  Critical Value:  K+ 2.6  Date & Time Notied:  03/08/2020 at 1545  Provider Notified: Dr. Delton Coombes  Orders Received/Actions taken: Give KCl 20 mEq IV and 40 mEq po x 2 doses

## 2020-03-10 NOTE — Addendum Note (Signed)
Addended by: Joie Bimler on: 03/10/2020 10:28 AM   Modules accepted: Orders

## 2020-03-17 ENCOUNTER — Emergency Department (HOSPITAL_COMMUNITY): Payer: Medicare Other

## 2020-03-17 ENCOUNTER — Other Ambulatory Visit: Payer: Self-pay

## 2020-03-17 ENCOUNTER — Observation Stay (HOSPITAL_COMMUNITY)
Admission: EM | Admit: 2020-03-17 | Discharge: 2020-03-18 | Disposition: A | Discharge status: Home / Self Care | Payer: Medicare Other | Attending: Family Medicine | Admitting: Family Medicine

## 2020-03-17 ENCOUNTER — Encounter (HOSPITAL_COMMUNITY): Payer: Self-pay

## 2020-03-17 DIAGNOSIS — R197 Diarrhea, unspecified: Secondary | ICD-10-CM | POA: Diagnosis not present

## 2020-03-17 DIAGNOSIS — R2681 Unsteadiness on feet: Secondary | ICD-10-CM | POA: Diagnosis not present

## 2020-03-17 DIAGNOSIS — E1122 Type 2 diabetes mellitus with diabetic chronic kidney disease: Secondary | ICD-10-CM | POA: Diagnosis not present

## 2020-03-17 DIAGNOSIS — R2689 Other abnormalities of gait and mobility: Secondary | ICD-10-CM | POA: Diagnosis not present

## 2020-03-17 DIAGNOSIS — C7931 Secondary malignant neoplasm of brain: Secondary | ICD-10-CM | POA: Diagnosis not present

## 2020-03-17 DIAGNOSIS — E119 Type 2 diabetes mellitus without complications: Secondary | ICD-10-CM | POA: Diagnosis not present

## 2020-03-17 DIAGNOSIS — R918 Other nonspecific abnormal finding of lung field: Secondary | ICD-10-CM | POA: Diagnosis not present

## 2020-03-17 DIAGNOSIS — C3411 Malignant neoplasm of upper lobe, right bronchus or lung: Secondary | ICD-10-CM

## 2020-03-17 DIAGNOSIS — Z87891 Personal history of nicotine dependence: Secondary | ICD-10-CM | POA: Insufficient documentation

## 2020-03-17 DIAGNOSIS — R0902 Hypoxemia: Secondary | ICD-10-CM | POA: Diagnosis not present

## 2020-03-17 DIAGNOSIS — E860 Dehydration: Secondary | ICD-10-CM | POA: Diagnosis not present

## 2020-03-17 DIAGNOSIS — G9340 Encephalopathy, unspecified: Secondary | ICD-10-CM | POA: Insufficient documentation

## 2020-03-17 DIAGNOSIS — R531 Weakness: Secondary | ICD-10-CM

## 2020-03-17 DIAGNOSIS — Z85118 Personal history of other malignant neoplasm of bronchus and lung: Secondary | ICD-10-CM | POA: Diagnosis not present

## 2020-03-17 DIAGNOSIS — I10 Essential (primary) hypertension: Secondary | ICD-10-CM | POA: Diagnosis not present

## 2020-03-17 DIAGNOSIS — R609 Edema, unspecified: Secondary | ICD-10-CM | POA: Diagnosis not present

## 2020-03-17 DIAGNOSIS — W19XXXA Unspecified fall, initial encounter: Secondary | ICD-10-CM

## 2020-03-17 DIAGNOSIS — R4020 Unspecified coma: Secondary | ICD-10-CM | POA: Diagnosis not present

## 2020-03-17 DIAGNOSIS — I6782 Cerebral ischemia: Secondary | ICD-10-CM | POA: Diagnosis not present

## 2020-03-17 DIAGNOSIS — C3490 Malignant neoplasm of unspecified part of unspecified bronchus or lung: Secondary | ICD-10-CM | POA: Diagnosis not present

## 2020-03-17 DIAGNOSIS — Z8669 Personal history of other diseases of the nervous system and sense organs: Secondary | ICD-10-CM | POA: Diagnosis not present

## 2020-03-17 DIAGNOSIS — Z20822 Contact with and (suspected) exposure to covid-19: Secondary | ICD-10-CM | POA: Diagnosis not present

## 2020-03-17 DIAGNOSIS — R0602 Shortness of breath: Secondary | ICD-10-CM | POA: Insufficient documentation

## 2020-03-17 LAB — URINALYSIS, ROUTINE W REFLEX MICROSCOPIC
Bacteria, UA: NONE SEEN
Bilirubin Urine: NEGATIVE
Glucose, UA: NEGATIVE mg/dL
Hgb urine dipstick: NEGATIVE
Ketones, ur: 5 mg/dL — AB
Leukocytes,Ua: NEGATIVE
Nitrite: NEGATIVE
Protein, ur: 100 mg/dL — AB
Specific Gravity, Urine: 1.024 (ref 1.005–1.030)
pH: 5 (ref 5.0–8.0)

## 2020-03-17 LAB — COMPREHENSIVE METABOLIC PANEL
ALT: 28 U/L (ref 0–44)
AST: 20 U/L (ref 15–41)
Albumin: 3 g/dL — ABNORMAL LOW (ref 3.5–5.0)
Alkaline Phosphatase: 53 U/L (ref 38–126)
Anion gap: 12 (ref 5–15)
BUN: 26 mg/dL — ABNORMAL HIGH (ref 8–23)
CO2: 25 mmol/L (ref 22–32)
Calcium: 8.7 mg/dL — ABNORMAL LOW (ref 8.9–10.3)
Chloride: 100 mmol/L (ref 98–111)
Creatinine, Ser: 1.77 mg/dL — ABNORMAL HIGH (ref 0.61–1.24)
GFR, Estimated: 40 mL/min — ABNORMAL LOW (ref >60)
Glucose, Bld: 131 mg/dL — ABNORMAL HIGH (ref 70–99)
Potassium: 3.2 mmol/L — ABNORMAL LOW (ref 3.5–5.1)
Sodium: 137 mmol/L (ref 135–145)
Total Bilirubin: 0.9 mg/dL (ref 0.3–1.2)
Total Protein: 6.9 g/dL (ref 6.5–8.1)

## 2020-03-17 LAB — RESPIRATORY PANEL BY RT PCR (FLU A&B, COVID)
Influenza A by PCR: NEGATIVE
Influenza B by PCR: NEGATIVE
SARS Coronavirus 2 by RT PCR: NEGATIVE

## 2020-03-17 LAB — CBC WITH DIFFERENTIAL/PLATELET
Abs Immature Granulocytes: 0.25 10*3/uL — ABNORMAL HIGH (ref 0.00–0.07)
Basophils Absolute: 0.1 10*3/uL (ref 0.0–0.1)
Basophils Relative: 0 %
Eosinophils Absolute: 0 10*3/uL (ref 0.0–0.5)
Eosinophils Relative: 0 %
HCT: 41.7 % (ref 39.0–52.0)
Hemoglobin: 13.9 g/dL (ref 13.0–17.0)
Immature Granulocytes: 2 %
Lymphocytes Relative: 22 %
Lymphs Abs: 2.6 10*3/uL (ref 0.7–4.0)
MCH: 29.9 pg (ref 26.0–34.0)
MCHC: 33.3 g/dL (ref 30.0–36.0)
MCV: 89.7 fL (ref 80.0–100.0)
Monocytes Absolute: 0.8 10*3/uL (ref 0.1–1.0)
Monocytes Relative: 7 %
Neutro Abs: 8.2 10*3/uL — ABNORMAL HIGH (ref 1.7–7.7)
Neutrophils Relative %: 69 %
Platelets: 299 10*3/uL (ref 150–400)
RBC: 4.65 MIL/uL (ref 4.22–5.81)
RDW: 15.8 % — ABNORMAL HIGH (ref 11.5–15.5)
WBC: 12 10*3/uL — ABNORMAL HIGH (ref 4.0–10.5)
nRBC: 0 % (ref 0.0–0.2)

## 2020-03-17 LAB — CBG MONITORING, ED: Glucose-Capillary: 84 mg/dL (ref 70–99)

## 2020-03-17 LAB — BRAIN NATRIURETIC PEPTIDE: B Natriuretic Peptide: 131 pg/mL — ABNORMAL HIGH (ref 0.0–100.0)

## 2020-03-17 LAB — LIPASE, BLOOD: Lipase: 45 U/L (ref 11–51)

## 2020-03-17 LAB — CK: Total CK: 42 U/L — ABNORMAL LOW (ref 49–397)

## 2020-03-17 LAB — MAGNESIUM: Magnesium: 1.8 mg/dL (ref 1.7–2.4)

## 2020-03-17 MED ORDER — POTASSIUM CHLORIDE CRYS ER 20 MEQ PO TBCR
30.0000 meq | EXTENDED_RELEASE_TABLET | ORAL | Status: AC
  Administered 2020-03-17: 30 meq via ORAL
  Filled 2020-03-17: qty 2

## 2020-03-17 MED ORDER — SODIUM CHLORIDE 0.9 % IV SOLN: INTRAVENOUS | Status: DC

## 2020-03-17 MED ORDER — INSULIN ASPART 100 UNIT/ML ~~LOC~~ SOLN: 0.0000 [IU] | Freq: Every day | SUBCUTANEOUS | Status: DC

## 2020-03-17 MED ORDER — LACTATED RINGERS IV BOLUS
500.0000 mL | Freq: Once | INTRAVENOUS | Status: AC
  Administered 2020-03-17: 500 mL via INTRAVENOUS

## 2020-03-17 MED ORDER — INSULIN ASPART 100 UNIT/ML ~~LOC~~ SOLN: 0.0000 [IU] | Freq: Three times a day (TID) | SUBCUTANEOUS | Status: DC

## 2020-03-17 NOTE — H&P (Signed)
TRH H&P   Patient Demographics:    Darrell Christensen, is a 74 y.o. male  MRN: [id number]   DOB - 11-06-1945  Admit Date - 03/17/2020  Outpatient Primary MD for the patient is Celene Squibb, MD  Referring MD/NP/PA: Dr. Laverta [place]  Outpatient Specialists: Dr. Delton Coombes  Patient coming from: Home  Chief Complaint  Patient presents with  . Leg Swelling      HPI:    Darrell Christensen  is a 74 y.o. male, with stage IV lung adenocarcinoma, followed by Dr. Delton Coombes with known brain mets, immunotherapy currently on hold due to therapy induced colitis, known brain metastasis, CKD, hypertension, hyperlipidemia, diabetes mellitus, hypertension, patient was brought by his daughter to ED secondary to progressive weakness and multiple falls, at baseline patient lives home by himself, he was still driving until last week, patient with recent diagnosis of immunotherapy induced colitis, with diarrhea, he is finishing prednisone taper, and currently on Remicade, recently started on Lomotil with some improvement of his diarrhea, patient himself is poor historian, daughter found the patient on the floor so brought him to ED for further evaluation, patient with progressive weakness, nothing focal, multiple falls and has slightly worsening lower extremity edema, he denies any chest pain, fever, cough or shortness of breath, she has been trying to get him to come to her house so that she can look after him but he did refuse. - IN ED work-up significant for potassium 3.2, creatinine at baseline of 1.7, CK of 42, negative UA, white blood cell count of 12, COVID-19 negative, he started on gentle hydration and [facility] hospital[facility] consulted to admit.    Review of systems:    In addition to the HPI above, No Fever-chills, weakness, fatigue and multiple falls No Headache, No changes with Vision or hearing, No problems swallowing  food or Liquids, No Chest pain, Cough or Shortness of Breath, No Abdominal pain, No Nausea or Vommitting, reports  diarrhea, has been improving. No Blood in stool or Urine, No dysuria, No new skin rashes or bruises, No new joints pains-aches,  No new weakness, tingling, numbness in any extremity, No recent weight gain or loss, No polyuria, polydypsia or polyphagia, No significant Mental Stressors.  A full 10 point Review of Systems was done, except as stated above, all other Review of Systems were negative.   With Past History of the following :    Past Medical History:  Diagnosis Date  . Adrenal mass, left ([place]) 04/01/2016  . Diabetes mellitus ([place]) 05/02/2016  . Diabetes mellitus without complication ([place])   . Hypertension   . Mass of breast, right 04/01/2016  . Mass of upper lobe of right lung 04/01/2016   2 adjacent, 5 cm masses, hilar adenopathy  . Primary cancer of right upper lobe of lung ([place]) 04/01/2016   2 adjacent, 5 cm masses, hilar adenopathy  . Pulmonary embolus ([place]) 03/31/2016  .  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 COLONOSCOPY N/A 07/28/2017   Procedure: COLONOSCOPY;  Surgeon: Rogene Houston, MD;  Location: [facility] ENDO SUITE;  Service: Endoscopy;  Laterality: N/A;  205  . PORTACATH PLACEMENT Left 05/13/2016   Procedure: INSERTION PORT-A-CATH;  Surgeon: Aviva Signs, MD;  Location: [facility] ORS;  Service: General;  Laterality: Left;      Social History:     Social History   Tobacco Use  . Smoking status: Former Smoker    Years: 61.00    Quit date: 04/11/2014    Years since quitting: 5.9  . Smokeless tobacco: Never Used  . Tobacco comment: patient does vape smoking x 2 years  Substance Use Topics  . Alcohol use: Not Currently    Comment: quit 10 years     Lives -home by himself  Mobility -independent    Family History :     Family History  Problem Relation Age of Onset  . Stroke Mother   . Heart  attack Father   . Heart attack Brother       Home Medications:   Prior to Admission medications   Medication Sig Start Date End Date Taking? Authorizing Provider  ALPRAZolam [name]) 1 MG tablet Take 1 mg by mouth 2 (two) times daily as needed for anxiety.   Yes [provider]  amLODipine (NORVASC) 2.5 MG tablet Take 2.5 mg by mouth daily. 03/22/16  Yes [provider]  diphenoxylate-atropine (LOMOTIL) 2.5-0.025 MG tablet Take 2 tablets 4 times per day. 03/08/20  Yes Derek Jack, MD  DULoxetine (CYMBALTA) 60 MG capsule Take 1 capsule (60 mg total) by mouth daily. 01/26/20  Yes Derek Jack, MD  famotidine (PEPCID) 20 MG tablet Take 1 tablet (20 mg total) by mouth daily. 01/13/20  Yes Derek Jack, MD  folic acid (FOLVITE) 1 MG tablet TAKE ONE TABLET BY MOUTH ONCE DAILY. START 5-7 DAYS BEFORE ALIMTA CHEMOTHERAPY AND CONTINUE FOR 21 DAYS AFTER CHEMOTHERAPY. Patient taking differently: Take 1 mg by mouth daily.  09/03/19  Yes Lockamy, Randi L, NP-C  furosemide (LASIX) 20 MG tablet Take 2 tablets (40 mg total) by mouth daily. 11/25/16  Yes Kefalas, Manon Hilding, PA-C  gabapentin (NEURONTIN) 400 MG capsule Take 1 capsule (400 mg total) by mouth 3 (three) times daily. 07/14/19  Yes Derek Jack, MD  GLIPIZIDE XL 5 MG 24 hr tablet Take 5 mg by mouth daily.  03/22/16  Yes [provider]  hydrocortisone 2.5 % lotion Apply topically as needed.  02/13/19  Yes [provider]  lactulose (CHRONULAC) 10 GM/15ML solution Take 30 mLs (20 g total) by mouth at bedtime as needed for mild constipation. 08/06/19  Yes Derek Jack, MD  lidocaine-prilocaine (EMLA) cream APPLY A QUARTER SIZE AMOUNT TO THE AFFECTED AREA 1 HOUR PRIOR TO COMING TO CHEMOTHERAPY. COVER WITH A PLASTIC WRAP. 01/07/19  Yes Lockamy, Randi L, NP-C  magnesium oxide (MAG-OX) 400 (241.3 Mg) MG tablet Take 1 tablet (400 mg total) by mouth 2 (two) times daily. 04/14/19  Yes Derek Jack, MD  metFORMIN (GLUCOPHAGE) 500 MG tablet Take 500 mg by mouth 2 (two) times daily. 03/07/16  Yes [provider]  metoprolol succinate (TOPROL-XL) 25 MG 24 hr tablet TAKE ONE TABLET BY MOUTH IN THE EVENING. 03/26/17  Yes Holley Bouche, NP  ondansetron (ZOFRAN) 4 MG tablet Take 1 tablet (4 mg total) by mouth every 8 (eight) hours as needed  for nausea or vomiting. 11/03/18  Yes Triplett, Tammy, PA-C  oxyCODONE-acetaminophen (PERCOCET) 10-325 MG tablet Take 1 tablet by mouth as needed.  06/18/17  Yes [provider]  Pembrolizumab (KEYTRUDA IV) Inject into the vein. 12/16/18  Yes [provider]  potassium chloride (K-DUR,KLOR-CON) 10 MEQ tablet Take 10 mEq by mouth daily.  01/16/18  Yes [provider]  predniSONE (DELTASONE) 20 MG tablet Take 2.5 tablets (50 mg total) by mouth daily with breakfast. 01/20/20  Yes Derek Jack, MD  prochlorperazine (COMPAZINE) 10 MG tablet Take 1 tablet (10 mg total) by mouth every 6 (six) hours as needed (Nausea or vomiting). 10/15/18  Yes Derek Jack, MD  simvastatin (ZOCOR) 20 MG tablet Take 20 mg by mouth every evening. 03/07/16  Yes [provider]  torsemide (DEMADEX) 20 MG tablet Take 1 tablet (20 mg total) by mouth daily. 01/27/19  Yes Derek Jack, MD     Allergies:    No Known Allergies   Physical Exam:   Vitals  Blood pressure 135/89, pulse 90, temperature 97.6 F (36.4 C), temperature source Tympanic, resp. rate 18, height 5\' 11"  (1.803 m), weight 100.2 kg, SpO2 97 %.   1. General developed male, laying in bed, no apparent distress  2.  Early confused, easily distracted, awake alert x2, pleasant   3. No F.N deficits, ALL C.Nerves Intact, Strength 5/5 all 4 extremities, Sensation intact all 4 extremities, Plantars down going.  4. Ears and Eyes appear Normal, Conjunctivae clear, PERRLA. Moist Oral Mucosa.  5. Supple Neck, No JVD, No cervical lymphadenopathy  appriciated, No Carotid Bruits.  6. Symmetrical Chest wall movement, Good air movement bilaterally, CTAB.  7. RRR, No Gallops, Rubs or Murmurs, No Parasternal Heave.  Trace lower extremity edema  8. Positive Bowel Sounds, Abdomen Soft, No tenderness, No organomegaly appriciated,No rebound -guarding or rigidity.  9.  No Cyanosis, Normal Skin Turgor, No Skin Rash or Bruise.  10. Good muscle tone,  joints appear normal , no effusions, Normal ROM.     Data Review:    CBC Recent Labs  Lab 03/17/20 1644  WBC 12.0*  HGB 13.9  HCT 41.7  PLT 299  MCV 89.7  MCH 29.9  MCHC 33.3  RDW 15.8*  LYMPHSABS 2.6  MONOABS 0.8  EOSABS 0.0  BASOSABS 0.1   ------------------------------------------------------------------------------------------------------------------  Chemistries  Recent Labs  Lab 03/17/20 1644  NA 137  K 3.2*  CL 100  CO2 25  GLUCOSE 131*  BUN 26*  CREATININE 1.77*  CALCIUM 8.7*  MG 1.8  AST 20  ALT 28  ALKPHOS 53  BILITOT 0.9   ------------------------------------------------------------------------------------------------------------------ estimated creatinine clearance is 43.5 mL/min (A) (by C-G formula based on SCr of 1.77 mg/dL (H)). ------------------------------------------------------------------------------------------------------------------ No results for input(s): TSH, T4TOTAL, T3FREE, THYROIDAB in the last 72 hours.  Invalid input(s): FREET3  Coagulation profile No results for input(s): INR, PROTIME in the last 168 hours. ------------------------------------------------------------------------------------------------------------------- No results for input(s): DDIMER in the last 72 hours. -------------------------------------------------------------------------------------------------------------------  Cardiac Enzymes No results for input(s): CKMB, TROPONINI, MYOGLOBIN in the last 168 hours.  Invalid input(s):  CK ------------------------------------------------------------------------------------------------------------------    Component Value Date/Time   BNP 131.0 (H) 03/17/2020 1645     ---------------------------------------------------------------------------------------------------------------  Urinalysis    Component Value Date/Time   COLORURINE YELLOW 03/17/2020 1900   APPEARANCEUR CLEAR 03/17/2020 1900   LABSPEC 1.024 03/17/2020 1900   PHURINE 5.0 03/17/2020 1900   GLUCOSEU NEGATIVE 03/17/2020 1900   HGBUR NEGATIVE 03/17/2020 1900   [place] NEGATIVE 03/17/2020 1900  KETONESUR 5 (A) 03/17/2020 1900   PROTEINUR 100 (A) 03/17/2020 1900   NITRITE NEGATIVE 03/17/2020 1900   LEUKOCYTESUR NEGATIVE 03/17/2020 1900    ----------------------------------------------------------------------------------------------------------------   Imaging Results:    CT Head Wo Contrast  Result Date: 03/17/2020 CLINICAL DATA:  Lower extremity edema for several weeks, altered level of consciousness, personal history of lung cancer with brain metastases EXAM: CT HEAD WITHOUT CONTRAST TECHNIQUE: Contiguous axial images were obtained from the base of the skull through the vertex without intravenous contrast. COMPARISON:  12/23/2019 FINDINGS: Brain: Hypodensity within the right parietal lobe consistent with no new intracranial metastasis. Overall the appearance is stable since prior MRI. No signs of acute infarct or hemorrhage. Chronic small vessel ischemic changes are seen throughout the periventricular white matter. The lateral ventricles and midline structures are otherwise unremarkable. No acute extra-axial fluid collections. Vascular: No hyperdense vessel or unexpected calcification. Skull: Normal. Negative for fracture or focal lesion. Sinuses/Orbits: No acute finding. Other: None. IMPRESSION: 1. Hypodensity right parietal lobe consistent with known intracranial metastasis. No significant change  since prior studies allowing for differences in modality and technique. 2. Chronic small vessel ischemic changes. No acute infarct or hemorrhage. Electronically Signed   By: Randa Ngo M.D.   On: 03/17/2020 18:45   DG Chest Portable 1 View  Result Date: 03/17/2020 CLINICAL DATA:  74 year old male with shortness of breath, lower extremity swelling. History of non-small cell lung cancer. EXAM: PORTABLE CHEST 1 VIEW COMPARISON:  Restaging CT Chest 03/07/2020 and earlier. FINDINGS: Portable AP upright view at 1652 hours. Stable left chest power port. Stable lung volumes and mediastinal contours. Subtle by radiograph right upper lobe mass as seen recently by CT (arrow). Elsewhere when allowing for portable technique the lungs are clear. No pneumothorax. Severe degeneration at the left shoulder. No acute osseous abnormality identified. Paucity of bowel gas in the upper abdomen. IMPRESSION: No acute cardiopulmonary abnormality. Electronically Signed   By: Genevie Ann M.D.   On: 03/17/2020 16:59    My personal review of EKG: Pending   Assessment & Plan:    Active Problems:   HTN (hypertension), benign   Diabetes mellitus ([place])   Brain metastasis ([place])   Acute encephalopathy   Deconditioning/fall/acute metabolic encephalopathy -At baseline patient lives by himself, he was driving until last week. -He has been progressively weaker, more dehydrated, more confused, this is in the setting of diarrhea, dehydration and pharmacy. -Brain metastasis appears to be stable, he has no new focal deficits. -Continue with gentle hydration. -We will consult PT/OT. -Continue with IV fluids, hold torsemide. - I will hold her is Xanax, Cymbalta, Neurontin, Compazine, and this can be started back gradually . -Mentation already appeared to be improving .  Stage IV adenocarcinoma with brain metastasis. -Management per Dr. Delton Coombes, Beryle Flock currently on hold -Brain metastasis appears to be unchanged from previous    Immunotherapy related colitis -He is finishing his steroid taper, currently on 2.5 mg p.o. daily, he was treated with Remicade on 11/4 -Diarrhea has been improving with Lomotil, will continue with 1 tablet 4 times daily, but if it remains significant we will have to increase back to 2 tablets 4 times daily  CKD stage IIIb -At baseline, hold torsemide and continue with gentle hydration.  Hypertension -Hold meds and resume once stable.  Anemia -Resume Zocor once stable.  hypokalemia -Repleted.  Peripheral neuropathy -DM gabapentin and duloxetine once condition has improved.  Diabetes mellitus -Continue with glipizide XL, hold Metformin and will add sliding scale.  DVT Prophylaxis Heparin   AM Labs Ordered, also please review Full Orders  Family Communication: Admission, patients condition and plan of care including tests being ordered have been discussed with the patient and brother and daughter by phone who indicate understanding and agree with the plan and Code Status.  Code Status full  Likely DC to  home  Condition GUARDED   Consults called: none    Admission status: onservation    Time spent in minutes : 65 minutes   Phillips Climes M.D on 03/17/2020 at 8:51 PM   [facility] - Office  [phone]

## 2020-03-17 NOTE — ED Triage Notes (Signed)
Pt with hx of lung cancer. Brought in by EMS due to swelling in feel for a couple of weeks. Reports some diarrhea, last loose stool 3 days ago

## 2020-03-17 NOTE — ED Provider Notes (Signed)
Emergency Department Provider Note   I have reviewed the triage vital signs and the nursing notes.   HISTORY  Chief Complaint Leg Swelling   HPI Darrell Christensen is a 74 y.o. male with PMH of stage IV adenocarcinoma of the lung not currently on chemo or immunotherapy followed by Dr. Delton Coombes presents to the emergency department by EMS from home with generalized weakness and inability to walk.  Patient has been suffering with chronic colitis thought to be related to immunotherapy which has been recently stopped.  He has been managed with Lomotil and Prednisone without improvement.  He has been treated as an outpatient for dehydration and hypokalemia through the cancer center.  He is not currently on chemotherapy or radiation.  He has known metastatic disease to the brain.  Patient tells me that he has been feeling weak over the past several days and has not been able to get up on his own.  In speaking with his daughter Darrell Christensen she tells me that he has been having falls and laying on the ground and cannot get up.  She is not aware of any recent medication changes.  She tells me that his legs have been swollen in addition to the diarrhea.  She is not aware of any shortness of breath.  She has been trying to get him to come to her house so that she can look after him but he refuses.   Past Medical History:  Diagnosis Date  . 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 Diabetes mellitus ([place]) 05/02/2016  . Diabetes mellitus without complication ([place])   . Hypertension   . Mass of breast, right 04/01/2016  . Mass of upper lobe of right lung 04/01/2016   2 adjacent, 5 cm masses, hilar adenopathy  . Primary cancer of right upper lobe of lung ([place]) 04/01/2016   2 adjacent, 5 cm masses, hilar adenopathy  . Pulmonary embolus ([place]) 03/31/2016  . 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 Acute encephalopathy 03/17/2020  . Dehydration 04/14/2019  . Brain  metastasis ([place]) 11/27/2018  . Goals of care, counseling/discussion 10/15/2018  . Abnormal CT of the abdomen 07/22/2017  . Itching 10/10/2016  . Diabetes mellitus ([place]) 05/02/2016  . 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 Primary cancer of right upper lobe of lung ([place]) 04/01/2016  . Breast nodule 04/01/2016  . Adrenal mass, left ([place]) 04/01/2016  . HTN (hypertension), benign 04/01/2016  . Pulmonary embolus ([place]) 03/31/2016    Past Surgical History:  Procedure Laterality Date  . COLONOSCOPY N/A 07/28/2017   Procedure: COLONOSCOPY;  Surgeon: Rogene Houston, MD;  Location: [facility] ENDO SU[facility];  Service: Endoscopy;  Laterality: N/A;  205  . PORTACATH PLACEMENT Left 05/13/2016   Procedure: INSERTION PORT-A-CATH;  Surgeon: Aviva Signs, MD;  Location: [facility] ORS;  Service: General;  Laterality: Left;    Allergies Patient has no known allergies.  Family History  Problem Relation Age of Onset  . Stroke Mother   . Heart attack Father   . Heart attack Brother     Social History Social History   Tobacco Use  . Smoking status: Former Smoker    Years: 61.00    Quit date: 04/11/2014    Years since quitting: 5.9  . Smokeless tobacco: Never Used  . Tobacco comment: patient does vape smoking x 2 years  Vaping Use  . Vaping Use: Every day  Substance Use Topics  .  Alcohol use: Not Currently    Comment: quit 10 years  . Drug use: No    Review of Systems  Constitutional: No fever/chills. Positive generalized weakness and falls.  Eyes: No visual changes. ENT: No sore throat. Cardiovascular: Denies chest pain. Positive leg swelling.  Respiratory: Denies shortness of breath. Gastrointestinal: No abdominal pain.  No nausea, no vomiting.  Positive diarrhea.  No constipation. Genitourinary: Negative for dysuria. Musculoskeletal: Negative for back pain.  Skin: Negative for rash. Neurological: Negative for headaches, focal weakness or numbness.  10-point ROS otherwise  negative.  ____________________________________________   PHYSICAL EXAM:  VITAL SIGNS: ED Triage Vitals  Enc Vitals Group     BP 03/17/20 1152 (!) 138/92     Pulse Rate 03/17/20 1152 91     Resp 03/17/20 1152 20     Temp 03/17/20 1152 97.6 F (36.4 C)     Temp Source 03/17/20 1152 Tympanic     SpO2 03/17/20 1152 93 %     Weight 03/17/20 1153 221 lb (100.2 kg)     Height 03/17/20 1153 5\' 11"  (1.803 m)    Constitutional: Alert and oriented. Well appearing and in no acute distress. Eyes: Conjunctivae are normal.  Head: Atraumatic. Nose: No congestion/rhinnorhea. Mouth/Throat: Mucous membranes are dry.  Neck: No stridor.  Cardiovascular: Normal rate, regular rhythm. Good peripheral circulation. Grossly normal heart sounds.   Respiratory: Normal respiratory effort.  No retractions. Lungs CTAB. Gastrointestinal: Soft and nontender. No distention.  Musculoskeletal: No lower extremity tenderness with 1+ pitting edema bilaterally to the ankles. No gross deformities of extremities. Neurologic:  Normal speech and language. No gross focal neurologic deficits are appreciated.  Skin:  Skin is warm, dry and intact. No rash noted.  ____________________________________________   LABS (all labs ordered are listed, but only abnormal results are displayed)  Labs Reviewed  COMPREHENSIVE METABOLIC PANEL - Abnormal; Notable for the following components:      Result Value   Potassium 3.2 (*)    Glucose, Bld 131 (*)    BUN 26 (*)    Creatinine, Ser 1.77 (*)    Calcium 8.7 (*)    Albumin 3.0 (*)    GFR, Estimated 40 (*)    All other components within normal limits  CBC WITH DIFFERENTIAL/PLATELET - Abnormal; Notable for the following components:   WBC 12.0 (*)    RDW 15.8 (*)    Neutro Abs 8.2 (*)    Abs Immature Granulocytes 0.25 (*)    All other components within normal limits  URINALYSIS, ROUTINE W REFLEX MICROSCOPIC - Abnormal; Notable for the following components:   Ketones, ur 5  (*)    Protein, ur 100 (*)    All other components within normal limits  BRAIN NATRIURETIC PEPTIDE - Abnormal; Notable for the following components:   B Natriuretic Peptide 131.0 (*)    All other components within normal limits  CK - Abnormal; Notable for the following components:   Total CK 42 (*)    All other components within normal limits  HEMOGLOBIN A1C - Abnormal; Notable for the following components:   Hgb A1c MFr Bld 6.8 (*)    All other components within normal limits  BASIC METABOLIC PANEL - Abnormal; Notable for the following components:   Creatinine, Ser 1.46 (*)    Calcium 8.1 (*)    GFR, Estimated 50 (*)    All other components within normal limits  CBC - Abnormal; Notable for the following components:   RBC 3.92 (*)  Hemoglobin 11.9 (*)    HCT 36.2 (*)    RDW 15.8 (*)    All other components within normal limits  CBG MONITORING, ED - Abnormal; Notable for the following components:   Glucose-Capillary 102 (*)    All other components within normal limits  RESPIRATORY PANEL BY RT PCR (FLU A&B, COVID)  URINE CULTURE  LIPASE, BLOOD  MAGNESIUM  CBG MONITORING, ED   ____________________________________________  EKG   EKG Interpretation  Date/Time:  Friday March 17 2020 20:40:31 EST Ventricular Rate:  78 PR Interval:  156 QRS Duration: 112 QT Interval:  438 QTC Calculation: 499 R Axis:   -21 Text Interpretation: Normal sinus rhythm Cannot rule out Anterior infarct , age undetermined Abnormal ECG When compared with ECG of 03/31/2016, No significant change was found Confirmed by Delora Fuel ([id number]) on 03/18/2020 12:15:26 AM       ____________________________________________  RADIOLOGY  CT Head Wo Contrast  Result Date: 03/17/2020 CLINICAL DATA:  Lower extremity edema for several weeks, altered level of consciousness, personal history of lung cancer with brain metastases EXAM: CT HEAD WITHOUT CONTRAST TECHNIQUE: Contiguous axial images were obtained  from the base of the skull through the vertex without intravenous contrast. COMPARISON:  12/23/2019 FINDINGS: Brain: Hypodensity within the right parietal lobe consistent with no new intracranial metastasis. Overall the appearance is stable since prior MRI. No signs of acute infarct or hemorrhage. Chronic small vessel ischemic changes are seen throughout the periventricular white matter. The lateral ventricles and midline structures are otherwise unremarkable. No acute extra-axial fluid collections. Vascular: No hyperdense vessel or unexpected calcification. Skull: Normal. Negative for fracture or focal lesion. Sinuses/Orbits: No acute finding. Other: None. IMPRESSION: 1. Hypodensity right parietal lobe consistent with known intracranial metastasis. No significant change since prior studies allowing for differences in modality and technique. 2. Chronic small vessel ischemic changes. No acute infarct or hemorrhage. Electronically Signed   By: Randa Ngo M.D.   On: 03/17/2020 18:45   DG Chest Portable 1 View  Result Date: 03/17/2020 CLINICAL DATA:  74 year old male with shortness of breath, lower extremity swelling. History of non-small cell lung cancer. EXAM: PORTABLE CHEST 1 VIEW COMPARISON:  Restaging CT Chest 03/07/2020 and earlier. FINDINGS: Portable AP upright view at 1652 hours. Stable left chest power port. Stable lung volumes and mediastinal contours. Subtle by radiograph right upper lobe mass as seen recently by CT (arrow). Elsewhere when allowing for portable technique the lungs are clear. No pneumothorax. Severe degeneration at the left shoulder. No acute osseous abnormality identified. Paucity of bowel gas in the upper abdomen. IMPRESSION: No acute cardiopulmonary abnormality. Electronically Signed   By: Genevie Ann M.D.   On: 03/17/2020 16:59    ____________________________________________   PROCEDURES  Procedure(s) performed:    Procedures  None ____________________________________________   INITIAL IMPRESSION / ASSESSMENT AND PLAN / ED COURSE  Pertinent labs & imaging results that were available during my care of the patient were reviewed by me and considered in my medical decision making (see chart for details).   Patient presents to the emergency department for evaluation of generalized weakness with inability to walk.  Daughter tells me of multiple falls recently and lying on the floor.  Patient has been experiencing diarrhea and has been managed as an outpatient up until this point.  He has mild tachycardia on arrival but no fever or hypotension. Plan for CT head with known mets to the brain. Will screen for rhabdo and electrolyte abnormality.   Labs largely unremarkable.  Patient appears mostly dehydrated. CT head without acute findings. Plan for IVF and admit for obs. +/- MRI if not improving overnight but no focal deficits to prompt more emergent imaging.   Discussed patient's case with [facility] to request admission. Patient and family (if present) updated with plan. Care transferred to [facility] service.  I reviewed all nursing notes, vitals, pertinent old records, EKGs, labs, imaging (as available).  ____________________________________________  FINAL CLINICAL IMPRESSION(S) / ED DIAGNOSES  Final diagnoses:  Gait instability  Generalized weakness  Fall, initial encounter  Diarrhea, unspecified type  Dehydration     MEDICATIONS GIVEN DURING THIS VISIT:  Medications  potassium chloride SA (KLOR-CON) CR tablet 30 mEq (30 mEq Oral Not Given 03/18/20 0333)  metoprolol succinate (TOPROL-XL) 24 hr tablet 25 mg (has no administration in time range)  predniSONE (DELTASONE) tablet 50 mg (50 mg Oral Given 03/18/20 0758)  diphenoxylate-atropine (LOMOTIL) 2.5-0.025 MG per tablet 1 tablet (1 tablet Oral Not Given 03/18/20 1020)  famotidine (PEPCID) tablet 20 mg (20 mg Oral Given 00/76/22 6333)  folic acid (FOLVITE)  tablet 1 mg (1 mg Oral Given 03/18/20 1020)  potassium chloride SA (KLOR-CON) CR tablet 10 mEq (10 mEq Oral Given 03/18/20 1020)  heparin injection 5,000 Units (has no administration in time range)  0.9 %  sodium chloride infusion ( Intravenous New Bag/Given 03/17/20 2143)  insulin aspart (novoLOG) injection 0-15 Units (0 Units Subcutaneous Not Given 03/18/20 0754)  insulin aspart (novoLOG) injection 0-5 Units (0 Units Subcutaneous Not Given 03/17/20 2223)  lactated ringers bolus 500 mL (0 mLs Intravenous Stopped 03/17/20 2115)    Note:  This document was prepared using Dragon voice recognition software and may include unintentional dictation errors.  Nanda Quinton, MD, [facility] Emergency Medicine    Nita Whitmire, [facility], MD 03/18/20 [phone]

## 2020-03-18 DIAGNOSIS — N183 Chronic kidney disease, stage 3 unspecified: Secondary | ICD-10-CM

## 2020-03-18 DIAGNOSIS — G9340 Encephalopathy, unspecified: Secondary | ICD-10-CM | POA: Diagnosis not present

## 2020-03-18 DIAGNOSIS — I10 Essential (primary) hypertension: Secondary | ICD-10-CM

## 2020-03-18 DIAGNOSIS — C7931 Secondary malignant neoplasm of brain: Secondary | ICD-10-CM | POA: Diagnosis not present

## 2020-03-18 DIAGNOSIS — E1122 Type 2 diabetes mellitus with diabetic chronic kidney disease: Secondary | ICD-10-CM

## 2020-03-18 LAB — CBC
HCT: 36.2 % — ABNORMAL LOW (ref 39.0–52.0)
Hemoglobin: 11.9 g/dL — ABNORMAL LOW (ref 13.0–17.0)
MCH: 30.4 pg (ref 26.0–34.0)
MCHC: 32.9 g/dL (ref 30.0–36.0)
MCV: 92.3 fL (ref 80.0–100.0)
Platelets: 241 10*3/uL (ref 150–400)
RBC: 3.92 MIL/uL — ABNORMAL LOW (ref 4.22–5.81)
RDW: 15.8 % — ABNORMAL HIGH (ref 11.5–15.5)
WBC: 10.4 10*3/uL (ref 4.0–10.5)
nRBC: 0 % (ref 0.0–0.2)

## 2020-03-18 LAB — BASIC METABOLIC PANEL
Anion gap: 9 (ref 5–15)
BUN: 20 mg/dL (ref 8–23)
CO2: 26 mmol/L (ref 22–32)
Calcium: 8.1 mg/dL — ABNORMAL LOW (ref 8.9–10.3)
Chloride: 102 mmol/L (ref 98–111)
Creatinine, Ser: 1.46 mg/dL — ABNORMAL HIGH (ref 0.61–1.24)
GFR, Estimated: 50 mL/min — ABNORMAL LOW (ref >60)
Glucose, Bld: 93 mg/dL (ref 70–99)
Potassium: 3.5 mmol/L (ref 3.5–5.1)
Sodium: 137 mmol/L (ref 135–145)

## 2020-03-18 LAB — HEMOGLOBIN A1C
Hgb A1c MFr Bld: 6.8 % — ABNORMAL HIGH (ref 4.8–5.6)
Mean Plasma Glucose: 148.46 mg/dL

## 2020-03-18 LAB — CBG MONITORING, ED: Glucose-Capillary: 102 mg/dL — ABNORMAL HIGH (ref 70–99)

## 2020-03-18 MED ORDER — GLIPIZIDE ER 5 MG PO TB24
5.0000 mg | ORAL_TABLET | Freq: Every day | ORAL | Status: DC
  Filled 2020-03-18 (×3): qty 1

## 2020-03-18 MED ORDER — POTASSIUM CHLORIDE CRYS ER 20 MEQ PO TBCR
10.0000 meq | EXTENDED_RELEASE_TABLET | Freq: Every day | ORAL | Status: DC
  Administered 2020-03-18: 10 meq via ORAL
  Filled 2020-03-18: qty 1

## 2020-03-18 MED ORDER — FAMOTIDINE 20 MG PO TABS
20.0000 mg | ORAL_TABLET | Freq: Every day | ORAL | Status: DC
  Administered 2020-03-18: 20 mg via ORAL
  Filled 2020-03-18: qty 1

## 2020-03-18 MED ORDER — METOPROLOL SUCCINATE ER 25 MG PO TB24: 25.0000 mg | ORAL_TABLET | Freq: Every evening | ORAL | Status: DC

## 2020-03-18 MED ORDER — PREDNISONE 50 MG PO TABS
50.0000 mg | ORAL_TABLET | Freq: Every day | ORAL | Status: DC
  Administered 2020-03-18: 50 mg via ORAL
  Filled 2020-03-18: qty 1

## 2020-03-18 MED ORDER — DIPHENOXYLATE-ATROPINE 2.5-0.025 MG PO TABS: 1.0000 | ORAL_TABLET | Freq: Four times a day (QID) | ORAL | Status: DC

## 2020-03-18 MED ORDER — HEPARIN SODIUM (PORCINE) 5000 UNIT/ML IJ SOLN: 5000.0000 [IU] | Freq: Three times a day (TID) | INTRAMUSCULAR | Status: DC

## 2020-03-18 MED ORDER — FOLIC ACID 1 MG PO TABS: 1.0000 mg | ORAL_TABLET | Freq: Every day | ORAL | Status: AC

## 2020-03-18 MED ORDER — FOLIC ACID 1 MG PO TABS
1.0000 mg | ORAL_TABLET | Freq: Every day | ORAL | Status: DC
  Administered 2020-03-18: 1 mg via ORAL
  Filled 2020-03-18: qty 1

## 2020-03-18 NOTE — ED Notes (Signed)
Pt up and ambulatory with a stand by assistance to restroom at this time. Pt provided with a clean brief and gown, bed linens changed at this time. Pt educated on discharge order and is in agreement at this time.

## 2020-03-18 NOTE — Discharge Summary (Signed)
Physician Discharge Summary  OLAF MESA PZW:[id number] DOB: [birth year] DOA: 03/17/2020  PCP: Celene Squibb, MD Oncologist: Dr Delton Coombes   Admit date: 03/17/2020 Discharge date: 03/18/2020  Admitted From:  Home  Disposition: Home with [facility] services   Recommendations for Outpatient Follow-up:  1. Follow up with PCP in 1 weeks 2. Follow up with oncologist next week  Home Health: PT, OT, RN, Aide, SW  Discharge Condition: STABLE   CODE STATUS: FULL    Brief Hospitalization Summary: Please see all hospital notes, images, labs for full details of the hospitalization. ADMISSION HPI:  Darrell Christensen  is a 74 y.o. male, with stage IV lung adenocarcinoma, followed by Dr. Delton Coombes with known brain mets, immunotherapy currently on hold due to therapy induced colitis, known brain metastasis, CKD, hypertension, hyperlipidemia, diabetes mellitus, hypertension, patient was brought by his daughter to ED secondary to progressive weakness and multiple falls, at baseline patient lives home by himself, he was still driving until last week, patient with recent diagnosis of immunotherapy induced colitis, with diarrhea, he is finishing prednisone taper, and currently on Remicade, recently started on Lomotil with some improvement of his diarrhea, patient himself is poor historian, daughter found the patient on the floor so brought him to ED for further evaluation, patient with progressive weakness, nothing focal, multiple falls and has slightly worsening lower extremity edema, he denies any chest pain, fever, cough or shortness of breath, she has been trying to get him to come to her house so that she can look after him but he did refuse. - IN ED work-up significant for potassium 3.2, creatinine at baseline of 1.7, CK of 42, negative UA, white blood cell count of 12, COVID-19 negative, he started on gentle hydration and [facility] hospitalist consulted to admit.  Hospital Course  This patient was admitted with acute  metabolic encephalopathy thought secondary to a mild AKI and dehydration treated with IV fluids.  In addition his Xanax Cymbalta Neurontin and Compazine was held temporarily.  His mentation has improved with IV fluid hydration.  He was seen by PT and they have recommended home health PT and 24/7 observation.  Patient declines adamantly SNF placement.  He is willing to accept home health care.  Arrangements made for home health PT OT RN aide and social work.  Close follow-up outpatient with his oncologist and primary care strongly recommended.  Strongly advised medication review on outpatient follow-up and reduce as many medications that could be mind altering.  Pt monitored and at this time stable for discharge home.  Discharge Diagnoses:  Active Problems:   HTN (hypertension), benign   Diabetes mellitus ([place])   Brain metastasis ([place])   Acute encephalopathy   Discharge Instructions:  Allergies as of 03/18/2020   No Known Allergies     Medication List    STOP taking these medications   furosemide 20 MG tablet Commonly known as: LASIX   gabapentin 400 MG capsule Commonly known as: NEURONTIN     TAKE these medications   ALPRAZolam 1 MG tablet Commonly known as: XANAX Take 1 mg by mouth 2 (two) times daily as needed for anxiety.   amLODipine 2.5 MG tablet Commonly known as: NORVASC Take 2.5 mg by mouth daily.   diphenoxylate-atropine 2.5-0.025 MG tablet Commonly known as: LOMOTIL Take 2 tablets 4 times per day.   DULoxetine 60 MG capsule Commonly known as: CYMBALTA Take 1 capsule (60 mg total) by mouth daily.   famotidine 20 MG tablet Commonly known as: PEPCID Take  1 tablet (20 mg total) by mouth daily.   folic acid 1 MG tablet Commonly known as: FOLVITE Take 1 tablet (1 mg total) by mouth daily. What changed: See the new instructions.   glipiZIDE XL 5 MG 24 hr tablet Generic drug: glipiZIDE Take 5 mg by mouth daily.   hydrocortisone 2.5 % lotion Apply topically  as needed.   KEYTRUDA IV Inject into the vein.   lactulose 10 GM/15ML solution Commonly known as: CHRONULAC Take 30 mLs (20 g total) by mouth at bedtime as needed for mild constipation.   lidocaine-prilocaine cream Commonly known as: EMLA APPLY A QUARTER SIZE AMOUNT TO THE AFFECTED AREA 1 HOUR PRIOR TO COMING TO CHEMOTHERAPY. COVER WITH A PLASTIC WRAP.   magnesium oxide 400 (241.3 Mg) MG tablet Commonly known as: MAG-OX Take 1 tablet (400 mg total) by mouth 2 (two) times daily.   metFORMIN 500 MG tablet Commonly known as: GLUCOPHAGE Take 500 mg by mouth 2 (two) times daily.   metoprolol succinate 25 MG 24 hr tablet Commonly known as: TOPROL-XL TAKE ONE TABLET BY MOUTH IN THE EVENING.   ondansetron 4 MG tablet Commonly known as: ZOFRAN Take 1 tablet (4 mg total) by mouth every 8 (eight) hours as needed for nausea or vomiting.   oxyCODONE-acetaminophen 10-325 MG tablet Commonly known as: PERCOCET Take 1 tablet by mouth as needed.   potassium chloride 10 MEQ tablet Commonly known as: KLOR-CON Take 10 mEq by mouth daily.   predniSONE 20 MG tablet Commonly known as: DELTASONE Take 2.5 tablets (50 mg total) by mouth daily with breakfast.   prochlorperazine 10 MG tablet Commonly known as: COMPAZINE Take 1 tablet (10 mg total) by mouth every 6 (six) hours as needed (Nausea or vomiting).   simvastatin 20 MG tablet Commonly known as: ZOCOR Take 20 mg by mouth every evening.   torsemide 20 MG tablet Commonly known as: Demadex Take 1 tablet (20 mg total) by mouth daily.       Follow-up Information    Celene Squibb, MD. Schedule an appointment as soon as possible for a visit in 1 week(s).   Specialty: Internal Medicine Contact information: [address] [place] [address] [phone]        Derek Jack, MD. Schedule an appointment as soon as possible for a visit in 5 day(s).   Specialty: Hematology Contact information: [place] [address] [phone]              No Known Allergies Allergies as of 03/18/2020   No Known Allergies     Medication List    STOP taking these medications   furosemide 20 MG tablet Commonly known as: LASIX   gabapentin 400 MG capsule Commonly known as: NEURONTIN     TAKE these medications   ALPRAZolam 1 MG tablet Commonly known as: XANAX Take 1 mg by mouth 2 (two) times daily as needed for anxiety.   amLODipine 2.5 MG tablet Commonly known as: NORVASC Take 2.5 mg by mouth daily.   diphenoxylate-atropine 2.5-0.025 MG tablet Commonly known as: LOMOTIL Take 2 tablets 4 times per day.   DULoxetine 60 MG capsule Commonly known as: CYMBALTA Take 1 capsule (60 mg total) by mouth daily.   famotidine 20 MG tablet Commonly known as: PEPCID Take 1 tablet (20 mg total) by mouth daily.   folic acid 1 MG tablet Commonly known as: FOLVITE Take 1 tablet (1 mg total) by mouth daily. What changed: See  the new instructions.   glipiZIDE XL 5 MG 24 hr tablet Generic drug: glipiZIDE Take 5 mg by mouth daily.   hydrocortisone 2.5 % lotion Apply topically as needed.   KEYTRUDA IV Inject into the vein.   lactulose 10 GM/15ML solution Commonly known as: CHRONULAC Take 30 mLs (20 g total) by mouth at bedtime as needed for mild constipation.   lidocaine-prilocaine cream Commonly known as: EMLA APPLY A QUARTER SIZE AMOUNT TO THE AFFECTED AREA 1 HOUR PRIOR TO COMING TO CHEMOTHERAPY. COVER WITH A PLASTIC WRAP.   magnesium oxide 400 (241.3 Mg) MG tablet Commonly known as: MAG-OX Take 1 tablet (400 mg total) by mouth 2 (two) times daily.   metFORMIN 500 MG tablet Commonly known as: GLUCOPHAGE Take 500 mg by mouth 2 (two) times daily.   metoprolol succinate 25 MG 24 hr tablet Commonly known as: TOPROL-XL TAKE ONE TABLET BY MOUTH IN THE EVENING.   ondansetron 4 MG tablet Commonly known as: ZOFRAN Take 1 tablet (4 mg total) by mouth every 8 (eight) hours as needed for  nausea or vomiting.   oxyCODONE-acetaminophen 10-325 MG tablet Commonly known as: PERCOCET Take 1 tablet by mouth as needed.   potassium chloride 10 MEQ tablet Commonly known as: KLOR-CON Take 10 mEq by mouth daily.   predniSONE 20 MG tablet Commonly known as: DELTASONE Take 2.5 tablets (50 mg total) by mouth daily with breakfast.   prochlorperazine 10 MG tablet Commonly known as: COMPAZINE Take 1 tablet (10 mg total) by mouth every 6 (six) hours as needed (Nausea or vomiting).   simvastatin 20 MG tablet Commonly known as: ZOCOR Take 20 mg by mouth every evening.   torsemide 20 MG tablet Commonly known as: Demadex Take 1 tablet (20 mg total) by mouth daily.       Procedures/Studies: CT Head Wo Contrast  Result Date: 03/17/2020 CLINICAL DATA:  Lower extremity edema for several weeks, altered level of consciousness, personal history of lung cancer with brain metastases EXAM: CT HEAD WITHOUT CONTRAST TECHNIQUE: Contiguous axial images were obtained from the base of the skull through the vertex without intravenous contrast. COMPARISON:  12/23/2019 FINDINGS: Brain: Hypodensity within the right parietal lobe consistent with no new intracranial metastasis. Overall the appearance is stable since prior MRI. No signs of acute infarct or hemorrhage. Chronic small vessel ischemic changes are seen throughout the periventricular white matter. The lateral ventricles and midline structures are otherwise unremarkable. No acute extra-axial fluid collections. Vascular: No hyperdense vessel or unexpected calcification. Skull: Normal. Negative for fracture or focal lesion. Sinuses/Orbits: No acute finding. Other: None. IMPRESSION: 1. Hypodensity right parietal lobe consistent with known intracranial metastasis. No significant change since prior studies allowing for differences in modality and technique. 2. Chronic small vessel ischemic changes. No acute infarct or hemorrhage. Electronically Signed   By:  Randa Ngo M.D.   On: 03/17/2020 18:45   CT Chest W Contrast  Result Date: 03/07/2020 CLINICAL DATA:  Non-small-cell lung cancer.  Restaging. EXAM: CT CHEST WITH CONTRAST TECHNIQUE: Multidetector CT imaging of the chest was performed during intravenous contrast administration. CONTRAST:  69mL OMNIPAQUE IOHEXOL 300 MG/ML  SOLN COMPARISON:  12/29/2019 FINDINGS: Cardiovascular: The heart size is normal. No substantial pericardial effusion. Coronary artery calcification is evident. Atherosclerotic calcification is noted in the wall of the thoracic aorta. Ascending thoracic aorta measures 4 cm diameter. Left Port-A-Cath tip is positioned in the proximal SVC. Mediastinum/Nodes: 14 mm right thyroid nodule is stable. Tiny scattered mediastinal lymph nodes are similar to  prior. 12 mm short axis right hilar node measured previously is 11 mm today. Small subcarinal node measured previously is stable. No left axillary adenopathy. The esophagus has normal imaging features. There is no axillary lymphadenopathy. Lungs/Pleura: Posterior right upper lobe pulmonary mass measures 3.3 x 1.4 cm today, decreased from 4.0 x 2.1 cm previously. Stable 6 mm subpleural nodule anterior right hemithorax. Additional scattered tiny pulmonary nodules are similar. No new suspicious pulmonary nodule or mass. Chronic underlying interstitial changes again noted. No pleural effusion. Upper Abdomen: 2.6 x 1.7 cm left adrenal nodule was 2.8 x 1.6 cm previously. Musculoskeletal: No worrisome lytic or sclerotic osseous abnormality. Advanced degenerative changes noted left shoulder, incompletely visualized. IMPRESSION: 1. Interval decrease in size of the posterior right upper lobe pulmonary mass. 2. Stable to slight decrease in size of the left adrenal nodule. 3. Stable 14 mm right thyroid nodule. Not clinically significant; no follow-up imaging recommended (ref: J Am Coll Radiol. 2015 Feb;12(2): 143-50). 4. Aortic Atherosclerosis (ICD10-I70.0).  Electronically Signed   By: Misty Stanley M.D.   On: 03/07/2020 14:25   DG Chest Portable 1 View  Result Date: 03/17/2020 CLINICAL DATA:  74 year old male with shortness of breath, lower extremity swelling. History of non-small cell lung cancer. EXAM: PORTABLE CHEST 1 VIEW COMPARISON:  Restaging CT Chest 03/07/2020 and earlier. FINDINGS: Portable AP upright view at 1652 hours. Stable left chest power port. Stable lung volumes and mediastinal contours. Subtle by radiograph right upper lobe mass as seen recently by CT (arrow). Elsewhere when allowing for portable technique the lungs are clear. No pneumothorax. Severe degeneration at the left shoulder. No acute osseous abnormality identified. Paucity of bowel gas in the upper abdomen. IMPRESSION: No acute cardiopulmonary abnormality. Electronically Signed   By: Genevie Ann M.D.   On: 03/17/2020 16:59     Subjective: Pt reports that he is feeling better and ambulating better.  The IV fluids helped.  He says he wants to go home.  He is not interested in rehab but he is willing to have home health therapy.   Discharge Exam: Vitals:   03/18/20 0931 03/18/20 1133  BP: (!) 148/95 119/72  Pulse: 98 98  Resp: 16 16  Temp:  98.3 F (36.8 C)  SpO2: 97% 95%   Vitals:   03/17/20 2150 03/18/20 0545 03/18/20 0931 03/18/20 1133  BP: 135/85 114/69 (!) 148/95 119/72  Pulse: 80 100 98 98  Resp: 17 18 16 16   Temp:  97.9 F (36.6 C)  98.3 F (36.8 C)  TempSrc:  Oral  Oral  SpO2: 100% 96% 97% 95%  Weight:      Height:       General: Pt is alert, awake, not in acute distress Cardiovascular: normal S1/S2 +, no rubs, no gallops Respiratory: CTA bilaterally, no wheezing, no rhonchi Abdominal: Soft, NT, ND, bowel sounds + Extremities:  no cyanosis   The results of significant diagnostics from this hospitalization (including imaging, microbiology, ancillary and laboratory) are listed below for reference.     Microbiology: Recent Results (from the past 240  hour(s))  Respiratory Panel by RT PCR (Flu A&B, Covid) - Nasopharyngeal Swab     Status: None   Collection Time: 03/17/20  5:01 PM   Specimen: Nasopharyngeal Swab  Result Value Ref Range Status   SARS Coronavirus 2 by RT PCR NEGATIVE NEGATIVE Final    Comment: (NOTE) SARS-CoV-2 target nucleic acids are NOT DETECTED.  The SARS-CoV-2 RNA is generally detectable in upper respiratoy specimens during the  acute phase of infection. The lowest concentration of SARS-CoV-2 viral copies this assay can detect is 131 copies/mL. A negative result does not preclude SARS-Cov-2 infection and should not be used as the sole basis for treatment or other patient management decisions. A negative result may occur with  improper specimen collection/handling, submission of specimen other than nasopharyngeal swab, presence of viral mutation(s) within the areas targeted by this assay, and inadequate number of viral copies (<131 copies/mL). A negative result must be combined with clinical observations, patient history, and epidemiological information. The expected result is Negative.  Fact Sheet for Patients:  PinkCheek.be  Fact Sheet for Healthcare Providers:  GravelBags.it  This test is no t yet approved or cleared by the [place] FDA and  has been authorized for detection and/or diagnosis of SARS-CoV-2 by FDA under an Emergency Use Authorization (EUA). This EUA will remain  in effect (meaning this test can be used) for the duration of the COVID-19 declaration under Section 564(b)(1) of the Act, 21 U.S.C. section 360bbb-3(b)(1), unless the authorization is terminated or revoked sooner.     Influenza A by PCR NEGATIVE NEGATIVE Final   Influenza B by PCR NEGATIVE NEGATIVE Final    Comment: (NOTE) The Xpert Xpress SARS-CoV-2/FLU/RSV assay is intended as an aid in  the diagnosis of influenza from Nasopharyngeal swab specimens and  should not  be used as a sole basis for treatment. Nasal washings and  aspirates are unacceptable for Xpert Xpress SARS-CoV-2/FLU/RSV  testing.  Fact Sheet for Patients: PinkCheek.be  Fact Sheet for Healthcare Providers: GravelBags.it  This test is not yet approved or cleared by the [place] FDA and  has been authorized for detection and/or diagnosis of SARS-CoV-2 by  FDA under an Emergency Use Authorization (EUA). This EUA will remain  in effect (meaning this test can be used) for the duration of the  Covid-19 declaration under Section 564(b)(1) of the Act, 21  U.S.C. section 360bbb-3(b)(1), unless the authorization is  terminated or revoked. Performed at [facility], [address] [place], [place] [address]      Labs: BNP (last 3 results) Recent Labs    03/17/20 1645  BNP 258.5*   Basic Metabolic Panel: Recent Labs  Lab 03/17/20 1644 03/18/20 0510  NA 137 137  K 3.2* 3.5  CL 100 102  CO2 25 26  GLUCOSE 131* 93  BUN 26* 20  CREATININE 1.77* 1.46*  CALCIUM 8.7* 8.1*  MG 1.8  --    Liver Function Tests: Recent Labs  Lab 03/17/20 1644  AST 20  ALT 28  ALKPHOS 53  BILITOT 0.9  PROT 6.9  ALBUMIN 3.0*   Recent Labs  Lab 03/17/20 1644  LIPASE 45   No results for input(s): AMMONIA in the last 168 hours. CBC: Recent Labs  Lab 03/17/20 1644 03/18/20 0510  WBC 12.0* 10.4  NEUTROABS 8.2*  --   HGB 13.9 11.9*  HCT 41.7 36.2*  MCV 89.7 92.3  PLT 299 241   Cardiac Enzymes: Recent Labs  Lab 03/17/20 1700  CKTOTAL 42*   BNP: Invalid input(s): POCBNP CBG: Recent Labs  Lab 03/17/20 2222 03/18/20 0934  GLUCAP 84 102*   D-Dimer No results for input(s): DDIMER in the last 72 hours. Hgb A1c Recent Labs    03/17/20 1645  HGBA1C 6.8*   Lipid Profile No results for input(s): CHOL, HDL, LDLCALC, TRIG, CHOLHDL, LDLDIRECT in the last 72 hours. Thyroid function studies No results for input(s):  TSH, T4TOTAL, T3FREE, THYROIDAB in  the last 72 hours.  Invalid input(s): FREET3 Anemia work up No results for input(s): VITAMINB12, FOLATE, FERRITIN, TIBC, IRON, RETICCTPCT in the last 72 hours. Urinalysis    Component Value Date/Time   COLORURINE YELLOW 03/17/2020 1900   APPEARANCEUR CLEAR 03/17/2020 1900   LABSPEC 1.024 03/17/2020 1900   PHURINE 5.0 03/17/2020 1900   GLUCOSEU NEGATIVE 03/17/2020 1900   HGBUR NEGATIVE 03/17/2020 1900   BILIRUBINUR NEGATIVE 03/17/2020 1900   KETONESUR 5 (A) 03/17/2020 1900   PROTEINUR 100 (A) 03/17/2020 1900   NITRITE NEGATIVE 03/17/2020 1900   LEUKOCYTESUR NEGATIVE 03/17/2020 1900   Sepsis Labs Invalid input(s): PROCALCITONIN,  WBC,  LACTICIDVEN Microbiology Recent Results (from the past 240 hour(s))  Respiratory Panel by RT PCR (Flu A&B, Covid) - Nasopharyngeal Swab     Status: None   Collection Time: 03/17/20  5:01 PM   Specimen: Nasopharyngeal Swab  Result Value Ref Range Status   SARS Coronavirus 2 by RT PCR NEGATIVE NEGATIVE Final    Comment: (NOTE) SARS-CoV-2 target nucleic acids are NOT DETECTED.  The SARS-CoV-2 RNA is generally detectable in upper respiratoy specimens during the acute phase of infection. The lowest concentration of SARS-CoV-2 viral copies this assay can detect is 131 copies/mL. A negative result does not preclude SARS-Cov-2 infection and should not be used as the sole basis for treatment or other patient management decisions. A negative result may occur with  improper specimen collection/handling, submission of specimen other than nasopharyngeal swab, presence of viral mutation(s) within the areas targeted by this assay, and inadequate number of viral copies (<131 copies/mL). A negative result must be combined with clinical observations, patient history, and epidemiological information. The expected result is Negative.  Fact Sheet for Patients:  PinkCheek.be  Fact Sheet for  Healthcare Providers:  GravelBags.it  This test is no t yet approved or cleared by the [place] FDA and  has been authorized for detection and/or diagnosis of SARS-CoV-2 by FDA under an Emergency Use Authorization (EUA). This EUA will remain  in effect (meaning this test can be used) for the duration of the COVID-19 declaration under Section 564(b)(1) of the Act, 21 U.S.C. section 360bbb-3(b)(1), unless the authorization is terminated or revoked sooner.     Influenza A by PCR NEGATIVE NEGATIVE Final   Influenza B by PCR NEGATIVE NEGATIVE Final    Comment: (NOTE) The Xpert Xpress SARS-CoV-2/FLU/RSV assay is intended as an aid in  the diagnosis of influenza from Nasopharyngeal swab specimens and  should not be used as a sole basis for treatment. Nasal washings and  aspirates are unacceptable for Xpert Xpress SARS-CoV-2/FLU/RSV  testing.  Fact Sheet for Patients: PinkCheek.be  Fact Sheet for Healthcare Providers: GravelBags.it  This test is not yet approved or cleared by the [place] FDA and  has been authorized for detection and/or diagnosis of SARS-CoV-2 by  FDA under an Emergency Use Authorization (EUA). This EUA will remain  in effect (meaning this test can be used) for the duration of the  Covid-19 declaration under Section 564(b)(1) of the Act, 21  U.S.C. section 360bbb-3(b)(1), unless the authorization is  terminated or revoked. Performed at [facility], [address], [place], [place] [address]     Time coordinating discharge:   SIGNED:  Irwin Brakeman, MD  [facility] 03/18/2020, 12:03 PM How to contact the [facility] Attending or Consulting provider [place] or covering provider during after hours [place], for this patient?  1. Check the care team in [facility] and look for  a) attending/consulting [place] provider listed and b) the [facility] team listed 2. Log into [email or web address] and  use [place]'s universal password to access. If you do not have the password, please contact the hospital operator. 3. Locate the [facility] provider you are looking for under [facility] and page to a number that you can be directly reached. 4. If you still have difficulty reaching the provider, please page the [facility] (Director on Call) for the Hospitalists listed on amion for assistance.

## 2020-03-18 NOTE — Discharge Instructions (Signed)
Fall Prevention in the Home, Adult  Falls can cause injuries. They can happen to people of all ages. There are many things you can do to make your home safe and to help prevent falls. Ask for help when making these changes, if needed. What actions can I take to prevent falls? General Instructions  Use good lighting in all rooms. Replace any light bulbs that burn out.  Turn on the lights when you go into a dark area. Use night-lights.  Keep items that you use often in easy-to-reach places. Lower the shelves around your home if necessary.  Set up your furniture so you have a clear path. Avoid moving your furniture around.  Do not have throw rugs and other things on the floor that can make you trip.  Avoid walking on wet floors.  If any of your floors are uneven, fix them.  Add color or contrast paint or tape to clearly mark and help you see: ? Any grab bars or handrails. ? First and last steps of stairways. ? Where the edge of each step is.  If you use a stepladder: ? Make sure that it is fully opened. Do not climb a closed stepladder. ? Make sure that both sides of the stepladder are locked into place. ? Ask someone to hold the stepladder for you while you use it.  If there are any pets around you, be aware of where they are. What can I do in the bathroom?      Keep the floor dry. Clean up any water that spills onto the floor as soon as it happens.  Remove soap buildup in the tub or shower regularly.  Use non-skid mats or decals on the floor of the tub or shower.  Attach bath mats securely with double-sided, non-slip rug tape.  If you need to sit down in the shower, use a plastic, non-slip stool.  Install grab bars by the toilet and in the tub and shower. Do not use towel bars as grab bars. What can I do in the bedroom?  Make sure that you have a light by your bed that is easy to reach.  Do not use any sheets or blankets that are too big for your bed. They should not  hang down onto the floor.  Have a firm chair that has side arms. You can use this for support while you get dressed. What can I do in the kitchen?  Clean up any spills right away.  If you need to reach something above you, use a strong step stool that has a grab bar.  Keep electrical cords out of the way.  Do not use floor polish or wax that makes floors slippery. If you must use wax, use non-skid floor wax. What can I do with my stairs?  Do not leave any items on the stairs.  Make sure that you have a light switch at the top of the stairs and the bottom of the stairs. If you do not have them, ask someone to add them for you.  Make sure that there are handrails on both sides of the stairs, and use them. Fix handrails that are broken or loose. Make sure that handrails are as long as the stairways.  Install non-slip stair treads on all stairs in your home.  Avoid having throw rugs at the top or bottom of the stairs. If you do have throw rugs, attach them to the floor with carpet tape.  Choose a carpet that  does not hide the edge of the steps on the stairway.  Check any carpeting to make sure that it is firmly attached to the stairs. Fix any carpet that is loose or worn. What can I do on the outside of my home?  Use bright outdoor lighting.  Regularly fix the edges of walkways and driveways and fix any cracks.  Remove anything that might make you trip as you walk through a door, such as a raised step or threshold.  Trim any bushes or trees on the path to your home.  Regularly check to see if handrails are loose or broken. Make sure that both sides of any steps have handrails.  Install guardrails along the edges of any raised decks and porches.  Clear walking paths of anything that might make someone trip, such as tools or rocks.  Have any leaves, snow, or ice cleared regularly.  Use sand or salt on walking paths during winter.  Clean up any spills in your garage right  away. This includes grease or oil spills. What other actions can I take?  Wear shoes that: ? Have a low heel. Do not wear high heels. ? Have rubber bottoms. ? Are comfortable and fit you well. ? Are closed at the toe. Do not wear open-toe sandals.  Use tools that help you move around (mobility aids) if they are needed. These include: ? Canes. ? Walkers. ? Scooters. ? Crutches.  Review your medicines with your doctor. Some medicines can make you feel dizzy. This can increase your chance of falling. Ask your doctor what other things you can do to help prevent falls. Where to find more information  Centers for Disease Control and Prevention, STEAD[employer]: https://garcia.biz/  [employer] on Aging: BrainJudge.co.uk Contact a doctor if:  You are afraid of falling at home.  You feel weak, drowsy, or dizzy at home.  You fall at home. Summary  There are many simple things that you can do to make your home safe and to help prevent falls.  Ways to make your home safe include removing tripping hazards and installing grab bars in the bathroom.  Ask for help when making these changes in your home. This information is not intended to replace advice given to you by your health care provider. Make sure you discuss any questions you have with your health care provider. Document Revised: 08/13/2018 Document Reviewed: 12/05/2016 Elsevier Patient Education  [place].    Dehydration, Elderly  Dehydration is condition in which there is not enough water or other fluids in the body. This happens when a person loses more fluids than he or she takes in. Important body parts cannot work right without the right amount of fluids. Any loss of fluids from the body can cause dehydration. People 34 years of age or older have a higher risk of dehydration than younger adults. This is because in older age, the body:  Is less able to keep the right amount of water.  Does not respond to  temperature changes as well.  Does not get a sense of thirst as easily or quickly. Dehydration can be mild, worse, or very bad. It should be treated right away to keep it from getting very bad. What are the causes? This condition may be caused by:  Conditions that cause loss of water or other fluids, such as: ? Watery poop (diarrhea). ? Vomiting. ? Sweating a lot. ? Peeing (urinating) a lot.  Not drinking enough fluids, especially when you: ?  Are ill. ? Are doing things that take a lot of energy to do.  Other illnesses and conditions, such as fever or infection.  Certain medicines, such as medicines that take extra fluid out of the body (diuretics).  Lack of safe drinking water.  Not being able to get enough water and food. What increases the risk? The following factors may make you more likely to develop this condition:  Having a long-term (chronic) illness that has not been treated the right way, such as: ? An illness that may cause you to pee more, such as diabetes. ? Kidney, heart, or lung disease. ? A condition such as dementia. This affects:  The brain and nervous system.  Thinking.  Feelings.  Being 58 years of age or older.  Having a disability.  Living in a place that is high above the ground or sea (high in altitude). The thinner, drier air causes more fluid loss. What are the signs or symptoms? Symptoms of dehydration depend on how bad it is. Mild or worse dehydration  Thirst.  Dry lips or dry mouth.  Feeling dizzy or light-headed, especially when you stand up from sitting.  Muscle cramps.  Your body making: ? Dark pee (urine). Pee may be the color of tea. ? Less pee than normal. ? Less tears than normal.  Headache. Very bad dehydration  Changes in skin. Skin may: ? Be cold to the touch (clammy). ? Be blotchy or pale. ? Not go back to normal right after you lightly pinch it and let it go.  Little or no tears, pee, or sweat.  Changes in  vital signs, such as: ? Fast breathing. ? Low blood pressure. ? Weak pulse. ? Pulse that is more than 100 beats a minute when you are sitting still.  Other changes, such as: ? Feeling very thirsty. ? Eyes that look hollow (sunken). ? Cold hands and feet. ? Being mixed up (confused). ? Being very tired (lethargic) or having trouble waking from sleep. ? Short-term weight loss. ? Loss of consciousness. How is this treated? Treatment for this condition depends on how bad it is. Treatment should start right away. Do not wait until your condition gets very bad. Very bad dehydration is an emergency. You will need to go to a hospital.  Mild or worse dehydration can be treated at home. You may be asked to: ? Drink more fluids. ? Drink an oral rehydration solution (ORS). This drink helps get the right amounts of fluids and salts and minerals in the blood (electrolytes).  Very bad dehydration can be treated: ? With fluids through an IV tube. ? By getting normal levels of salts and minerals in your blood. This is often done by giving salts and minerals through a tube. The tube is passed through your nose and into your stomach. ? By treating the root cause. Follow these instructions at home: Oral rehydration solution If told by your doctor, drink an ORS:  Make an ORS. Use instructions on the package.  Start by drinking small amounts, about  cup (120 mL) every 5-10 minutes.  Slowly drink more until you have had the amount that your doctor said to have. Eating and drinking         Drink enough clear fluid to keep your pee pale yellow. If you were told to drink an ORS, finish the ORS first. Then, start slowly drinking other clear fluids. Drink fluids such as: ? Water. Do not drink only water. Doing that can  make the salt (sodium) level in your body get too low. ? Water from ice chips you suck on. ? Fruit juice that you have added water to (diluted). ? Low-calorie sports drinks.  Eat  foods that have the right amounts of salts and minerals, such as: ? Bananas. ? Oranges. ? Potatoes. ? Tomatoes. ? Spinach.  Do not drink alcohol.  Avoid: ? Drinks that have a lot of sugar. These include:  High-calorie sports drinks.  Fruit juice that you did not add water to.  Soda.  Caffeine. ? Foods that are greasy or have a lot of fat or sugar. General instructions  Take over-the-counter and prescription medicines only as told by your doctor.  Do not take salt tablets. Doing that can make the salt level in your body get too high.  Return to your normal activities as told by your doctor. Ask your doctor what activities are safe for you.  Keep all follow-up visits as told by your doctor. This is important. Contact a doctor if:  You have pain in your belly (abdomen) and the pain: ? Gets worse. ? Stays in one place.  You have a rash.  You have a stiff neck.  You get angry or annoyed (irritable) more easily than normal.  You are more tired or have a harder time waking than normal.  You feel: ? Weak or dizzy. ? Very thirsty. Get help right away if you have:  Any symptoms of very bad dehydration.  A fever.  A very bad headache.  Symptoms of vomiting, such as: ? Your vomiting gets worse or does not go away. ? Your vomit has blood or green stuff in it. ? You cannot eat or drink without vomiting.  Problems with peeing or pooping (having a bowel movement), such as: ? Watery poop that gets worse or does not go away. ? Blood in your poop (stool). This may cause poop to look black and tarry. ? Not peeing in 6-8 hours. ? Peeing only a small amount of very dark pee in 6-8 hours.  Trouble breathing.  Symptoms that get worse with treatment. These symptoms may be an emergency. Do not wait to see if the symptoms will go away. Get medical help right away. Call your local emergency services (911 in the [place] Do not drive yourself to the  hospital. Summary  Dehydration is a condition in which there is not enough water or other fluids in the body. This happens when a person loses more fluids than he or she takes in.  Treatment for this condition depends on how bad it is. Treatment should be started right away. Do not wait until your condition gets very bad.  Drink enough clear fluid to keep your pee pale yellow. If you were told to drink an oral rehydration solution (ORS), finish the ORS first. Then, start slowly drinking other clear fluids.  Take over-the-counter and prescription medicines only as told by your doctor.  Get help right away if you have any symptoms of very bad dehydration. This information is not intended to replace advice given to you by your health care provider. Make sure you discuss any questions you have with your health care provider. Document Revised: 12/03/2018 Document Reviewed: 12/03/2018 Elsevier Patient Education  [place].    IMPORTANT INFORMATION: PAY CLOSE ATTENTION   PHYSICIAN DISCHARGE INSTRUCTIONS  Follow with Primary care provider  Celene Squibb, MD  and other consultants as instructed by your Hospitalist Physician  [place]  OR RETURN TO EMERGENCY ROOM IF SYMPTOMS COME BACK, WORSEN OR NEW PROBLEM DEVELOPS   Please note: You were cared for by a hospitalist during your hospital stay. Every effort will be made to forward records to your primary care provider.  You can request that your primary care provider send for your hospital records if they have not received them.  Once you are discharged, your primary care physician will handle any further medical issues. Please note that NO REFILLS for any discharge medications will be authorized once you are discharged, as it is imperative that you return to your primary care physician (or establish a relationship with a primary care physician if you do not have one) for your post hospital discharge needs so that they can reassess  your need for medications and monitor your lab values.  Please get a complete blood count and chemistry panel checked by your Primary MD at your next visit, and again as instructed by your Primary MD.  Get Medicines reviewed and adjusted: Please take all your medications with you for your next visit with your Primary MD  Laboratory/radiological data: Please request your Primary MD to go over all hospital tests and procedure/radiological results at the follow up, please ask your primary care provider to get all Hospital records sent to his/her office.  In some cases, they will be blood work, cultures and biopsy results pending at the time of your discharge. Please request that your primary care provider follow up on these results.  If you are diabetic, please bring your blood sugar readings with you to your follow up appointment with primary care.    Please call and make your follow up appointments as soon as possible.    Also Note the following: If you experience worsening of your admission symptoms, develop shortness of breath, life threatening emergency, suicidal or homicidal thoughts you must seek medical attention immediately by calling 911 or calling your MD immediately  if symptoms less severe.  You must read complete instructions/literature along with all the possible adverse reactions/side effects for all the Medicines you take and that have been prescribed to you. Take any new Medicines after you have completely understood and accpet all the possible adverse reactions/side effects.   Do not drive when taking Pain medications or sleeping medications (Benzodiazepines)  Do not take more than prescribed Pain, Sleep and Anxiety Medications. It is not advisable to combine anxiety,sleep and pain medications without talking with your primary care practitioner  Special Instructions: If you have smoked or chewed Tobacco  in the last 2 yrs please stop smoking, stop any regular Alcohol  and or  any Recreational drug use.  Wear Seat belts while driving.  Do not drive if taking any narcotic, mind altering or controlled substances or recreational drugs or alcohol.

## 2020-03-18 NOTE — ED Notes (Signed)
I assumed care of this patient at this time. At the time of assuming care, patient appears to be asleep in bed with no obvious signs of distress noted. Bed is locked in the lowest position, side rails x2, call light within reach. Will continue hourly rounding's at this time.

## 2020-03-18 NOTE — Evaluation (Signed)
Physical Therapy Evaluation Patient Details Name: Darrell Christensen MRN: [id number] DOB: 01-14-1946 Today's Date: 03/18/2020   History of Present Illness   Darrell Christensen  is a 74 y.o. male, with stage IV lung adenocarcinoma, followed by Dr. Delton Christensen with known brain mets, immunotherapy currently on hold due to therapy induced colitis, known brain metastasis, CKD, hypertension, hyperlipidemia, diabetes mellitus, hypertension, patient was brought by his daughter to ED secondary to progressive weakness and multiple falls, at baseline patient lives home by himself, he was still driving until last week, patient with recent diagnosis of immunotherapy induced colitis, with diarrhea, he is finishing prednisone taper, and currently on Remicade, recently started on Lomotil with some improvement of his diarrhea, patient himself is poor historian, daughter found the patient on the floor so brought him to ED for further evaluation, patient with progressive weakness, nothing focal, multiple falls and has slightly worsening lower extremity edema, he denies any chest pain, fever, cough or shortness of breath, she has been trying to get him to come to her house so that she can look after him but he did refuse.     Clinical Impression  Patient functioning near baseline for functional mobility and gait but remains limited by BLE weakness, fatigue and impaired static/dynamic balance. Patient requires min assist to transition to seated EOB secondary to weakness. He transfers to standing without AD and is slightly unsteady upon standing. Patient ambulates without AD but reaches for walls for support secondary to impaired dynamic balance. Patient educated on using RW upon returning home for balance and safety to reduce fall risk. All further needs can be met at next venue of care and patient is discharged to care of nursing for ambulation daily as tolerated for length of stay.       Follow Up Recommendations Home health  PT;Supervision/Assistance - 24 hour;Supervision for mobility/OOB    Equipment Recommendations  None recommended by PT    Recommendations for Other Services       Precautions / Restrictions Precautions Precautions: Fall Restrictions Weight Bearing Restrictions: No      Mobility  Bed Mobility Overal bed mobility: Needs Assistance Bed Mobility: Supine to Sit;Sit to Supine     Supine to sit: Min assist Sit to supine: Min guard        Transfers Overall transfer level: Needs assistance Equipment used: None Transfers: Stand Pivot Transfers;Sit to/from Stand Sit to Stand: Min guard Stand pivot transfers: Min guard       General transfer comment: minimal unsteadiness upon standing, no loss of balance  Ambulation/Gait Ambulation/Gait assistance: Supervision;Min guard Gait Distance (Feet): 50 Feet Assistive device: None Gait Pattern/deviations: Step-through pattern;Decreased step length - right;Decreased step length - left;Decreased stride length Gait velocity: decreased   General Gait Details: slow, labored cadence reaching for nearby walls for support, unsteady without loss of balance or fall  Stairs            Wheelchair Mobility    Modified Rankin (Stroke Patients Only)       Balance                                             Pertinent Vitals/Pain Pain Assessment: No/denies pain    Home Living Family/patient expects to be discharged to:: Private residence Living Arrangements: Alone Available Help at Discharge: Family Type of Home: Mobile home Home Access: Stairs to enter Entrance  Stairs-Rails: Can reach both;Left;Right Entrance Stairs-Number of Steps: 3   Home Equipment: [place] - 2 wheels;Cane - single point      Prior Function Level of Independence: Needs assistance;Independent   Gait / Transfers Assistance Needed: household ambulator without AD  ADL's / Homemaking Assistance Needed: independent with basic ADL         Hand Dominance        Extremity/Trunk Assessment   Upper Extremity Assessment Upper Extremity Assessment: Overall WFL for tasks assessed    Lower Extremity Assessment Lower Extremity Assessment: Generalized weakness    Cervical / Trunk Assessment Cervical / Trunk Assessment: Normal  Communication   Communication: No difficulties  Cognition Arousal/Alertness: Awake/alert Behavior During Therapy: WFL for tasks assessed/performed Overall Cognitive Status: Within Functional Limits for tasks assessed                                        General Comments      Exercises     Assessment/Plan    PT Assessment All further PT needs can be met in the next venue of care  PT Problem List Decreased strength;Decreased range of motion;Decreased activity tolerance;Decreased balance;Decreased mobility;Decreased knowledge of use of DME;Decreased safety awareness       PT Treatment Interventions      PT Goals (Current goals can be found in the Care Plan section)  Acute Rehab PT Goals Patient Stated Goal: Go home PT Goal Formulation: With patient Time For Goal Achievement: 03/18/20 Potential to Achieve Goals: Fair    Frequency     Barriers to discharge        Co-evaluation               AM-PAC PT "6 Clicks" Mobility  Outcome Measure Help needed turning from your back to your side while in a flat bed without using bedrails?: None Help needed moving from lying on your back to sitting on the side of a flat bed without using bedrails?: A Little Help needed moving to and from a bed to a chair (including a wheelchair)?: A Little Help needed standing up from a chair using your arms (e.g., wheelchair or bedside chair)?: A Little Help needed to walk in hospital room?: A Little Help needed climbing 3-5 steps with a railing? : A Lot 6 Click Score: 18    End of Session Equipment Utilized During Treatment: Gait belt Activity Tolerance: Patient tolerated  treatment well Patient left: in bed;with call bell/phone within reach   PT Visit Diagnosis: Unsteadiness on feet (R26.81);Other abnormalities of gait and mobility (R26.89);Muscle weakness (generalized) (M62.81);History of falling (Z91.81)    Time: 7011-0034 PT Time Calculation (min) (ACUTE ONLY): 9 min   Charges:   PT Evaluation $PT Eval Moderate Complexity: 1 Mod          11:33 AM, 03/18/20 Mearl Latin PT, DPT Physical Therapist at [facility]

## 2020-03-18 NOTE — TOC Transition Note (Signed)
Transition of Care [facility]) - CM/SW Discharge Note   Patient Details  Name: Darrell Christensen MRN: [id number] Date of Birth: 14-Aug-1945  [facility] CM/SW Contact:  Natasha Bence, LCSW Phone Number: 03/18/2020, 12:42 PM   Clinical Narrative:    CSW received order for [facility]. Family agreeable to [facility]. [facility] with [facility]. Corene Cornea agreeable to provide [facility] services to patient. TOC signing off.    Final next level of care: [place] Barriers to Discharge: Barriers Resolved   Patient Goals and CMS Choice Patient states their goals for this hospitalization and ongoing recovery are:: Return home with [facility] CMS Medicare.gov Compare Post Acute Care list provided to:: Patient Choice offered to / list presented to : Patient  Discharge Placement                  Name of family member notified: Diamond Nickel Patient and family notified of of transfer: 03/18/20  Discharge Plan and Services                          HH Arranged: RN, PT, Nurse's Aide [place] Agency: [place] ([place]) Date HH Agency Contacted: 03/18/20 Time [place] Agency Contacted: 1200 Representative spoke with at [place]: [place] ([place]) Interventions     Readmission Risk Interventions No flowsheet data found.

## 2020-03-19 LAB — URINE CULTURE: Culture: <10000 — AB

## 2020-03-20 DIAGNOSIS — E1122 Type 2 diabetes mellitus with diabetic chronic kidney disease: Secondary | ICD-10-CM | POA: Diagnosis not present

## 2020-03-20 DIAGNOSIS — I129 Hypertensive chronic kidney disease with stage 1 through stage 4 chronic kidney disease, or unspecified chronic kidney disease: Secondary | ICD-10-CM | POA: Diagnosis not present

## 2020-03-20 DIAGNOSIS — N1832 Chronic kidney disease, stage 3b: Secondary | ICD-10-CM | POA: Diagnosis not present

## 2020-03-20 DIAGNOSIS — D631 Anemia in chronic kidney disease: Secondary | ICD-10-CM | POA: Diagnosis not present

## 2020-03-21 ENCOUNTER — Other Ambulatory Visit: Payer: Self-pay

## 2020-03-21 ENCOUNTER — Other Ambulatory Visit: Payer: Self-pay | Admitting: Radiation Therapy

## 2020-03-21 ENCOUNTER — Inpatient Hospital Stay (HOSPITAL_COMMUNITY): Payer: Medicare Other | Admitting: Hematology

## 2020-03-21 ENCOUNTER — Inpatient Hospital Stay (HOSPITAL_COMMUNITY): Payer: Medicare Other

## 2020-03-21 DIAGNOSIS — C7931 Secondary malignant neoplasm of brain: Secondary | ICD-10-CM

## 2020-03-21 DIAGNOSIS — C7949 Secondary malignant neoplasm of other parts of nervous system: Secondary | ICD-10-CM

## 2020-03-21 DIAGNOSIS — C3411 Malignant neoplasm of upper lobe, right bronchus or lung: Secondary | ICD-10-CM

## 2020-03-21 LAB — COMPREHENSIVE METABOLIC PANEL
ALT: 22 U/L (ref 0–44)
AST: 16 U/L (ref 15–41)
Albumin: 3 g/dL — ABNORMAL LOW (ref 3.5–5.0)
Alkaline Phosphatase: 50 U/L (ref 38–126)
Anion gap: 11 (ref 5–15)
BUN: 9 mg/dL (ref 8–23)
CO2: 25 mmol/L (ref 22–32)
Calcium: 8.4 mg/dL — ABNORMAL LOW (ref 8.9–10.3)
Chloride: 101 mmol/L (ref 98–111)
Creatinine, Ser: 1.51 mg/dL — ABNORMAL HIGH (ref 0.61–1.24)
GFR, Estimated: 48 mL/min — ABNORMAL LOW (ref >60)
Glucose, Bld: 121 mg/dL — ABNORMAL HIGH (ref 70–99)
Potassium: 3 mmol/L — ABNORMAL LOW (ref 3.5–5.1)
Sodium: 137 mmol/L (ref 135–145)
Total Bilirubin: 0.8 mg/dL (ref 0.3–1.2)
Total Protein: 6.2 g/dL — ABNORMAL LOW (ref 6.5–8.1)

## 2020-03-21 LAB — CBC WITH DIFFERENTIAL/PLATELET
Abs Immature Granulocytes: 0.21 10*3/uL — ABNORMAL HIGH (ref 0.00–0.07)
Basophils Absolute: 0.1 10*3/uL (ref 0.0–0.1)
Basophils Relative: 1 %
Eosinophils Absolute: 0 10*3/uL (ref 0.0–0.5)
Eosinophils Relative: 0 %
HCT: 39.9 % (ref 39.0–52.0)
Hemoglobin: 13.1 g/dL (ref 13.0–17.0)
Immature Granulocytes: 2 %
Lymphocytes Relative: 35 %
Lymphs Abs: 3.5 10*3/uL (ref 0.7–4.0)
MCH: 30 pg (ref 26.0–34.0)
MCHC: 32.8 g/dL (ref 30.0–36.0)
MCV: 91.5 fL (ref 80.0–100.0)
Monocytes Absolute: 1.1 10*3/uL — ABNORMAL HIGH (ref 0.1–1.0)
Monocytes Relative: 10 %
Neutro Abs: 5.3 10*3/uL (ref 1.7–7.7)
Neutrophils Relative %: 52 %
Platelets: 277 10*3/uL (ref 150–400)
RBC: 4.36 MIL/uL (ref 4.22–5.81)
RDW: 16 % — ABNORMAL HIGH (ref 11.5–15.5)
WBC: 10.2 10*3/uL (ref 4.0–10.5)
nRBC: 0 % (ref 0.0–0.2)

## 2020-03-21 LAB — MAGNESIUM: Magnesium: 1.5 mg/dL — ABNORMAL LOW (ref 1.7–2.4)

## 2020-03-22 ENCOUNTER — Other Ambulatory Visit: Payer: Self-pay

## 2020-03-22 ENCOUNTER — Inpatient Hospital Stay (HOSPITAL_COMMUNITY): Payer: Medicare Other | Admitting: Hematology

## 2020-03-22 ENCOUNTER — Inpatient Hospital Stay (HOSPITAL_COMMUNITY): Payer: Medicare Other

## 2020-03-22 VITALS — BP 129/95 | HR 120 | Temp 97.2°F | Resp 22 | Wt 205.5 lb

## 2020-03-22 DIAGNOSIS — R4182 Altered mental status, unspecified: Secondary | ICD-10-CM | POA: Diagnosis not present

## 2020-03-22 DIAGNOSIS — C3490 Malignant neoplasm of unspecified part of unspecified bronchus or lung: Secondary | ICD-10-CM | POA: Diagnosis not present

## 2020-03-22 DIAGNOSIS — C3411 Malignant neoplasm of upper lobe, right bronchus or lung: Secondary | ICD-10-CM

## 2020-03-22 DIAGNOSIS — I517 Cardiomegaly: Secondary | ICD-10-CM | POA: Diagnosis not present

## 2020-03-22 DIAGNOSIS — C7931 Secondary malignant neoplasm of brain: Secondary | ICD-10-CM | POA: Diagnosis not present

## 2020-03-22 DIAGNOSIS — A419 Sepsis, unspecified organism: Secondary | ICD-10-CM | POA: Diagnosis not present

## 2020-03-22 DIAGNOSIS — E860 Dehydration: Secondary | ICD-10-CM

## 2020-03-22 DIAGNOSIS — J189 Pneumonia, unspecified organism: Secondary | ICD-10-CM | POA: Diagnosis not present

## 2020-03-22 DIAGNOSIS — R0602 Shortness of breath: Secondary | ICD-10-CM | POA: Diagnosis not present

## 2020-03-22 DIAGNOSIS — J811 Chronic pulmonary edema: Secondary | ICD-10-CM | POA: Diagnosis not present

## 2020-03-22 DIAGNOSIS — E876 Hypokalemia: Secondary | ICD-10-CM | POA: Diagnosis not present

## 2020-03-22 MED ORDER — METHYLPREDNISOLONE SODIUM SUCC 125 MG IJ SOLR
125.0000 mg | Freq: Once | INTRAMUSCULAR | Status: AC
  Administered 2020-03-22: 125 mg via INTRAVENOUS

## 2020-03-22 MED ORDER — SODIUM CHLORIDE 0.9% FLUSH
10.0000 mL | Freq: Once | INTRAVENOUS | Status: AC | PRN
  Administered 2020-03-22: 10 mL

## 2020-03-22 MED ORDER — SODIUM CHLORIDE 0.9 % IV SOLN: INTRAVENOUS | Status: DC

## 2020-03-22 MED ORDER — METHYLPREDNISOLONE SODIUM SUCC 125 MG IJ SOLR: 125.0000 mg | Freq: Once | INTRAMUSCULAR | Status: DC

## 2020-03-22 MED ORDER — HEPARIN SOD (PORK) LOCK FLUSH 100 UNIT/ML IV SOLN
500.0000 [IU] | Freq: Once | INTRAVENOUS | Status: AC | PRN
  Administered 2020-03-22: 500 [IU]

## 2020-03-22 MED ORDER — METHYLPREDNISOLONE SODIUM SUCC 125 MG IJ SOLR
INTRAMUSCULAR | Status: AC
  Filled 2020-03-22: qty 2

## 2020-03-22 MED ORDER — SODIUM CHLORIDE 0.9 % IV SOLN
Freq: Once | INTRAVENOUS | Status: AC
  Filled 2020-03-22: qty 1000

## 2020-03-22 NOTE — Progress Notes (Signed)
[facility] [address] [place] [address]   CLINIC:  Medical Oncology/Hematology  PCP:  Celene Squibb, MD [address] L[place] Cro[place] [address] [phone]   REASON FOR VISIT:  Follow-up for stage IV adenocarcinoma of right lung  PRIOR THERAPY:  1. Keytruda from 05/17/2016 to 09/24/2018. 2. Carboplatin & pemetrexed x 4 cycles from 12/25/2018 to 03/03/2019. 3. Keytruda x 17 cycles from 12/16/2018 to 12/09/2019.  NGS Results: PD-L1 50%  CURRENT THERAPY: Observation  BRIEF ONCOLOGIC HISTORY:  Oncology History  Primary cancer of right upper lobe of lung ([place])  05/14/2015 Procedure   Port placement by Dr. Arnoldo Morale   04/01/2016 Initial Diagnosis   Primary cancer of right upper lobe of lung ([place])   04/02/2016 Procedure   Ultrasound-guided core biopsy performed of a solid 1.5 cm soft tissue nodule in the right chest wall.   04/03/2016 Imaging   MRI brain- No acute and intracranial process or metastasis.  Moderate chronic small vessel ischemic disease.   04/04/2016 Pathology Results   Diagnosis Soft Tissue Needle Core Biopsy, Right Chest Wall SMOOTH MUSCLE NEOPLASM Microscopic Comment The differential diagnosis include low grade leiomyosarcoma. The neoplasm shows spindle cell morphology with rare mitotic figures and minimal atypia, no necrosis present. These cells stains positive for smooth muscle actin, desmin, negative for cytokeratin AE1&3, ck8/18, s100, cd117, cd 34 and cd99. This case also reviewed by Dr. [name] and agree.   04/05/2016 Procedure   CT-guided biopsy of right upper lobe lesion, with tissue specimen sent to pathology, by IR.   04/09/2016 Pathology Results   Lung, needle/core biopsy(ies), Right Upper Lobe - NON SMALL CELL CARCINOMA.   04/17/2016 Pathology Results   PDL1 High Expression.  TPS 50%.   04/24/2016 PET scan   1. Prominently hypermetabolic large right upper lobe mass with metastatic disease to the right  paratracheal and right hilar nodal chains, to the descending mesocolon, to the left upper buttock subcutaneous tissues, to the left upper lobe, to the right subscapularis muscle, and possibly to the left adrenal gland and right breast subcutaneous tissues. Appearance compatible with stage IV lung cancer. 2. There is some focal high activity in the ascending colon which is probably from peristalsis, less likely from a local colon mass. This does raise the possibility that the adjacent mesocolon tumor implant could be due to synchronous colon cancer rather than lung metastasis. 3. Other imaging findings of potential clinical significance: Coronary, aortic arch, and branch vessel atherosclerotic vascular disease. Aortoiliac atherosclerotic vascular disease. Deformity left proximal humerus and left glenohumeral joint from prior trauma. Enlarged prostate gland.   04/26/2016 Pathology Results   FoundationONE: Genomic alterations identified- KRAS U44I, CREBBP splice site [id number], LRP1B S515*, DGL87 F64*, PPI95 splice site [id number], SPTA1 splice site [id number], TP53 C135Y.  Additional findings- MS-Stable, TMB-intermediate (14 Muts/Mb).  No reportable alterations- EGFR, ALK, BRAF, MET, ERBB2, RET, ROS1.   05/17/2016 -  Chemotherapy   The patient had ONDANSETRON IVPB [facility] +/- DEXAMETHASONE, , Intravenous,  Once, 0 of 1 cycle  pembrolizumab (KEYTRUDA) 200 mg in sodium chloride 0.9 % 50 mL chemo infusion, 200 mg, Intravenous, Once, 0 of 5 cycles  for chemotherapy treatment.     08/06/2016 PET scan   IMPRESSION: 1. Overall considerable improvement. The right upper lobe mass is markedly reduced in volume and SUV. Thoracic adenopathy have resolved and the hypermetabolic lesion in the right subscapularis muscle has also resolved. 2. The left upper lobe nodule is no longer appreciably  hypermetabolic, and is highly indistinct although this may be due to motion artifact. Faint in indistinct  nodularity elsewhere in the lungs likewise not currently hypermetabolic. 3. There is some very faint residual low-grade activity along the subcutaneous deposit along the upper left buttock region. Marked reduction in size and activity of the tumor deposit adjacent to the descending colon. 4. Prior right pleural effusion has resolved. 5. Stable appearance of the adrenal glands, with low-level activity and a nodule in the left adrenal gland. 6. Stable small nodule in the right breast, low-grade metabolic activity with maximum SUV 2.2 (formerly 3.0) 7. Other imaging findings of potential clinical significance: Severe arthropathy of the left glenohumeral joint. Coronary, aortic arch, and branch vessel atherosclerotic vascular disease. Aortoiliac atherosclerotic vascular disease. Prominent prostate gland indents the bladder base.    12/16/2018 -  Chemotherapy   The patient had palonosetron (ALOXI) injection 0.25 mg, 0.25 mg, Intravenous,  Once, 4 of 4 cycles Administration: 0.25 mg (12/16/2018), 0.25 mg (01/27/2019), 0.25 mg (03/03/2019), 0.25 mg (01/06/2019) pegfilgrastim (NEULASTA) injection 6 mg, 6 mg, Subcutaneous, Once, 2 of 2 cycles Administration: 6 mg (01/08/2019) pegfilgrastim (NEULASTA ONPRO KIT) injection 6 mg, 6 mg, Subcutaneous, Once, 2 of 2 cycles pegfilgrastim-cbqv (UDENYCA) injection 6 mg, 6 mg, Subcutaneous, Once, 2 of 2 cycles Administration: 6 mg (01/29/2019), 6 mg (03/05/2019) ondansetron (ZOFRAN) 8 mg in sodium chloride 0.9 % 50 mL IVPB, 8 mg (100 % of original dose 8 mg), Intravenous,  Once, 3 of 3 cycles Dose modification: 8 mg (original dose 8 mg, Cycle 5) Administration: 8 mg (03/24/2019), 8 mg (04/16/2019) PEMEtrexed (ALIMTA) 1,100 mg in sodium chloride 0.9 % 100 mL chemo infusion, 490 mg/m2 = 1,125 mg, Intravenous,  Once, 6 of 6 cycles Dose modification: 400 mg/m2 (80 % of original dose 500 mg/m2, Cycle 3, Reason: Other (see comments), Comment: rash,  swelling) Administration: 1,100 mg (12/16/2018), 900 mg (01/27/2019), 900 mg (03/03/2019), 900 mg (03/24/2019), 900 mg (04/16/2019), 1,100 mg (01/06/2019) CARBOplatin (PARAPLATIN) 500 mg in sodium chloride 0.9 % 250 mL chemo infusion, 530 mg (110.7 % of original dose 477.5 mg), Intravenous,  Once, 4 of 4 cycles Dose modification:   (original dose 477.5 mg, Cycle 1),   (original dose 485.5 mg, Cycle 3),   (original dose 494 mg, Cycle 4),   (original dose 494 mg, Cycle 2) Administration: 500 mg (12/16/2018), 490 mg (01/27/2019), 490 mg (03/03/2019), 490 mg (01/06/2019) pembrolizumab (KEYTRUDA) 200 mg in sodium chloride 0.9 % 50 mL chemo infusion, 2 mg/kg = 200 mg, Intravenous, Once, 12 of 13 cycles Dose modification: 200 mg (original dose 2 mg/kg, Cycle 14, Reason: Provider Judgment) Administration: 200 mg (04/16/2019), 200 mg (05/31/2019), 200 mg (06/22/2019), 200 mg (07/13/2019), 200 mg (05/10/2019), 200 mg (08/03/2019), 200 mg (08/24/2019), 200 mg (09/14/2019), 200 mg (10/08/2019), 200 mg (10/29/2019), 200 mg (11/18/2019), 200 mg (12/09/2019) fosaprepitant (EMEND) 150 mg, dexamethasone (DECADRON) 12 mg in sodium chloride 0.9 % 145 mL IVPB, , Intravenous,  Once, 4 of 4 cycles Administration:  (12/16/2018),  (01/27/2019),  (03/03/2019),  (01/06/2019)  for chemotherapy treatment.    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  Two adjacent large masslike areas of consolidation within the right upper lobe with differential considerations including malignancy or pneumonia. Multiple enlarged right hilar and mediastinal lymph nodes which may be reactive or metastatic in etiology.  Additional indeterminate pulmonary nodules as above. Recommend attention  on follow-up.  Indeterminate 2.9 cm left adrenal nodule. This needs dedicated evaluation with pre and post contrast-enhanced CT or MRI  after confirmation of pulmonary process.  Indeterminate nodule within the right breast. Recommend dedicated evaluation with mammography.  Hepatic steatosis.   04/02/2016 Procedure   Ultrasound-guided core biopsy performed of a solid 1.5 cm soft tissue nodule in the right chest wall.   04/04/2016 Pathology Results   Diagnosis Soft Tissue Needle Core Biopsy, Right Chest Wall SMOOTH MUSCLE NEOPLASM Microscopic Comment The differential diagnosis include low grade leiomyosarcoma. The neoplasm shows spindle cell morphology with rare mitotic figures and minimal atypia, no necrosis present. These cells stains positive for smooth muscle actin, desmin, negative for cytokeratin AE1&3, ck8/18, s100, cd117, cd 34 and cd99. This case also reviewed by Dr. [name] and agree.      CANCER STAGING: Cancer Staging Primary cancer of right upper lobe of lung ([place]) Staging form: Lung, AJCC 8th Edition - Clinical: Stage IVB (cT3, cN2, cM1c) - Signed by Baird Cancer, PA-C on 05/07/2016   INTERVAL HISTORY:  Darrell Christensen, a 74 y.o. male, returns for routine follow-up of his stage IV adenocarcinoma of right lung. Wesam was last seen on 03/08/2020.   Today he is accompanied by his daughter, Darrell Christensen, and he reports feeling poorly. He reports having clumps of blood coming out with his diarrhea, which is still not resolved, going 3-6 times a day, and he has to wear Depends. After his last infusion on 11/4, he fell and lied on the floor for several days before his daughter came and called the EMS. He is very weak now and has to be in a wheelchair. He is doing physical therapy at home with the therapist coming once a week.   REVIEW OF SYSTEMS:  Review of Systems  Constitutional: Positive for appetite change (depleted) and fatigue (depleted).  Respiratory: Positive for shortness of breath.   Cardiovascular: Positive for chest pain (flank pain) and leg swelling.  Gastrointestinal: Positive for  diarrhea.  Neurological: Positive for dizziness and numbness (hands).  All other systems reviewed and are negative.   PAST MEDICAL/SURGICAL HISTORY:  Past Medical History:  Diagnosis Date  . Adrenal mass, left ([place]) 04/01/2016  . Diabetes mellitus ([place]) 05/02/2016  . Diabetes mellitus without complication ([place])   . Hypertension   . Mass of breast, right 04/01/2016  . Mass of upper lobe of right lung 04/01/2016   2 adjacent, 5 cm masses, hilar adenopathy  . Primary cancer of right upper lobe of lung ([place]) 04/01/2016   2 adjacent, 5 cm masses, hilar adenopathy  . Pulmonary embolus ([place]) 03/31/2016  . 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 COLONOSCOPY N/A 07/28/2017   Procedure: COLONOSCOPY;  Surgeon: Rogene Houston, MD;  Location: [facility] ENDO SUITE;  Service: Endoscopy;  Laterality: N/A;  205  . PORTACATH PLACEMENT Left 05/13/2016   Procedure: INSERTION PORT-A-CATH;  Surgeon: Aviva Signs, MD;  Location: [facility] ORS;  Service: General;  Laterality: Left;    SOCIAL HISTORY:  Social History   Socioeconomic History  . Marital status: Divorced    Spouse name: Not on file  . Number of children: Not on file  . Years of education: Not on file  . Highest education level: Not on file  Occupational History  . Not on file  Tobacco Use  . Smoking status: Former Smoker    Years: 61.00    Quit date: 04/11/2014  Years since quitting: 5.9  . Smokeless tobacco: Never Used  . Tobacco comment: patient does vape smoking x 2 years  Vaping Use  . Vaping Use: Every day  Substance and Sexual Activity  . Alcohol use: Not Currently    Comment: quit 10 years  . Drug use: No  . Sexual activity: Not on file  Other Topics Concern  . Not on file  Social History Narrative  . Not on file   Social Determinants of Health   Financial Resource Strain: High Risk  . Difficulty of Paying Living Expenses: Very hard  Food Insecurity: No Food Insecurity  . Worried  About Charity fundraiser in the Last Year: Never true  . Ran Out of Food in the Last Year: Never true  Transportation Needs: No Transportation Needs  . Lack of Transportation (Medical): No  . Lack of Transportation (Non-Medical): No  Physical Activity: Inactive  . Days of Exercise per Week: 0 days  . Minutes of Exercise per Session: 0 min  Stress: No Stress Concern Present  . Feeling of Stress : Not at all  Social Connections: Socially Isolated  . Frequency of Communication with Friends and Family: Never  . Frequency of Social Gatherings with Friends and Family: Never  . Attends Religious Services: Never  . Active Member of Clubs or Organizations: No  . Attends Archivist Meetings: Never  . Marital Status: Divorced  Human resources officer Violence: Not At Risk  . Fear of Current or Ex-Partner: No  . Emotionally Abused: No  . Physically Abused: No  . Sexually Abused: No    FAMILY HISTORY:  Family History  Problem Relation Age of Onset  . Stroke Mother   . Heart attack Father   . Heart attack Brother     CURRENT MEDICATIONS:  Current Outpatient Medications  Medication Sig Dispense Refill  . ALPRAZolam (XANAX) 1 MG tablet Take 1 mg by mouth 2 (two) times daily as needed for anxiety.    Marland Kitchen amLODipine (NORVASC) 2.5 MG tablet Take 2.5 mg by mouth daily.  4  . diphenoxylate-atropine (LOMOTIL) 2.5-0.025 MG tablet Take 2 tablets 4 times per day. 240 tablet 2  . DULoxetine (CYMBALTA) 60 MG capsule Take 1 capsule (60 mg total) by mouth daily. 30 capsule 6  . famotidine (PEPCID) 20 MG tablet Take 1 tablet (20 mg total) by mouth daily. 30 tablet 2  . folic acid (FOLVITE) 1 MG tablet Take 1 tablet (1 mg total) by mouth daily.    Marland Kitchen GLIPIZIDE XL 5 MG 24 hr tablet Take 5 mg by mouth daily.   4  . hydrocortisone 2.5 % lotion Apply topically as needed.     . lactulose (CHRONULAC) 10 GM/15ML solution Take 30 mLs (20 g total) by mouth at bedtime as needed for mild constipation. 480 mL 2  .  magnesium oxide (MAG-OX) 400 (241.3 Mg) MG tablet Take 1 tablet (400 mg total) by mouth 2 (two) times daily. 60 tablet 2  . metFORMIN (GLUCOPHAGE) 500 MG tablet Take 500 mg by mouth 2 (two) times daily.  4  . metoprolol succinate (TOPROL-XL) 25 MG 24 hr tablet TAKE ONE TABLET BY MOUTH IN THE EVENING. 30 tablet 0  . ondansetron (ZOFRAN) 4 MG tablet Take 1 tablet (4 mg total) by mouth every 8 (eight) hours as needed for nausea or vomiting. 4 tablet 0  . oxyCODONE-acetaminophen (PERCOCET) 10-325 MG tablet Take 1 tablet by mouth as needed.   0  . Pembrolizumab [facility]  IV) Inject into the vein.    . potassium chloride (K-DUR,KLOR-CON) 10 MEQ tablet Take 10 mEq by mouth daily.   0  . predniSONE (DELTASONE) 20 MG tablet Take 2.5 tablets (50 mg total) by mouth daily with breakfast. 70 tablet 0  . simvastatin (ZOCOR) 20 MG tablet Take 20 mg by mouth every evening.  5  . torsemide (DEMADEX) 20 MG tablet Take 1 tablet (20 mg total) by mouth daily. 30 tablet 1  . lidocaine-prilocaine (EMLA) cream APPLY A QUARTER SIZE AMOUNT TO THE AFFECTED AREA 1 HOUR PRIOR TO COMING TO CHEMOTHERAPY. COVER WITH A PLASTIC WRAP. (Patient not taking: Reported on 03/22/2020) 30 g 0  . prochlorperazine (COMPAZINE) 10 MG tablet Take 1 tablet (10 mg total) by mouth every 6 (six) hours as needed (Nausea or vomiting). (Patient not taking: Reported on 03/22/2020) 30 tablet 1   No current facility-administered medications for this visit.   Facility-Administered Medications Ordered in Other Visits  Medication Dose Route Frequency Provider Last Rate Last Admin  . 0.9 %  sodium chloride infusion   Intravenous Once Derek Jack, MD      . 0.9 %  sodium chloride infusion   Intravenous Once PRN Derek Jack, MD      . albuterol (PROVENTIL) (2.5 MG/3ML) 0.083% nebulizer solution 2.5 mg  2.5 mg Nebulization Once PRN Derek Jack, MD      . EPINEPHrine (ADRENALIN) 1 MG/10ML injection 0.25 mg  0.25 mg Intravenous Once  PRN Derek Jack, MD      . EPINEPHrine (ADRENALIN) 1 MG/10ML injection 0.25 mg  0.25 mg Intravenous Once PRN Derek Jack, MD        ALLERGIES:  No Known Allergies  PHYSICAL EXAM:  Performance status (ECOG): 1 - Symptomatic but completely ambulatory  Vitals:   03/22/20 1324  BP: (!) 129/95  Pulse: (!) 120  Resp: (!) 22  Temp: (!) 97.2 F (36.2 C)  SpO2: 96%   Wt Readings from Last 3 Encounters:  03/22/20 205 lb 8 oz (93.2 kg)  03/17/20 221 lb (100.2 kg)  03/10/20 211 lb 3.2 oz (95.8 kg)   Physical Exam Vitals reviewed.  Constitutional:      Appearance: Normal appearance.  Musculoskeletal:     Right lower leg: Edema (2+) present.     Left lower leg: Edema (2+) present.  Neurological:     General: No focal deficit present.     Mental Status: He is alert and oriented to person, place, and time.  Psychiatric:        Mood and Affect: Mood normal.        Behavior: Behavior normal.      LABORATORY DATA:  I have reviewed the labs as listed.  CBC Latest Ref Rng & Units 03/21/2020 03/18/2020 03/17/2020  WBC 4.0 - 10.5 K/uL 10.2 10.4 12.0(H)  Hemoglobin 13.0 - 17.0 g/dL 13.1 11.9(L) 13.9  Hematocrit 39 - 52 % 39.9 36.2(L) 41.7  Platelets 150 - 400 K/uL 277 241 299   CMP Latest Ref Rng & Units 03/21/2020 03/18/2020 03/17/2020  Glucose 70 - 99 mg/dL 121(H) 93 131(H)  BUN 8 - 23 mg/dL 9 20 26(H)  Creatinine 0.61 - 1.24 mg/dL 1.51(H) 1.46(H) 1.77(H)  Sodium 135 - 145 mmol/L 137 137 137  Potassium 3.5 - 5.1 mmol/L 3.0(L) 3.5 3.2(L)  Chloride 98 - 111 mmol/L 101 102 100  CO2 22 - 32 mmol/L '25 26 25  ' Calcium 8.9 - 10.3 mg/dL 8.4(L) 8.1(L) 8.7(L)  Total Protein 6.5 -  8.1 g/dL 6.2(L) - 6.9  Total Bilirubin 0.3 - 1.2 mg/dL 0.8 - 0.9  Alkaline Phos 38 - 126 U/L 50 - 53  AST 15 - 41 U/L 16 - 20  ALT 0 - 44 U/L 22 - 28    DIAGNOSTIC IMAGING:  I have independently reviewed the scans and discussed with the patient. CT Head Wo Contrast  Result Date:  03/17/2020 CLINICAL DATA:  Lower extremity edema for several weeks, altered level of consciousness, personal history of lung cancer with brain metastases EXAM: CT HEAD WITHOUT CONTRAST TECHNIQUE: Contiguous axial images were obtained from the base of the skull through the vertex without intravenous contrast. COMPARISON:  12/23/2019 FINDINGS: Brain: Hypodensity within the right parietal lobe consistent with no new intracranial metastasis. Overall the appearance is stable since prior MRI. No signs of acute infarct or hemorrhage. Chronic small vessel ischemic changes are seen throughout the periventricular white matter. The lateral ventricles and midline structures are otherwise unremarkable. No acute extra-axial fluid collections. Vascular: No hyperdense vessel or unexpected calcification. Skull: Normal. Negative for fracture or focal lesion. Sinuses/Orbits: No acute finding. Other: None. IMPRESSION: 1. Hypodensity right parietal lobe consistent with known intracranial metastasis. No significant change since prior studies allowing for differences in modality and technique. 2. Chronic small vessel ischemic changes. No acute infarct or hemorrhage. Electronically Signed   By: Randa Ngo M.D.   On: 03/17/2020 18:45   CT Chest W Contrast  Result Date: 03/07/2020 CLINICAL DATA:  Non-small-cell lung cancer.  Restaging. EXAM: CT CHEST WITH CONTRAST TECHNIQUE: Multidetector CT imaging of the chest was performed during intravenous contrast administration. CONTRAST:  59m OMNIPAQUE IOHEXOL 300 MG/ML  SOLN COMPARISON:  12/29/2019 FINDINGS: Cardiovascular: The heart size is normal. No substantial pericardial effusion. Coronary artery calcification is evident. Atherosclerotic calcification is noted in the wall of the thoracic aorta. Ascending thoracic aorta measures 4 cm diameter. Left Port-A-Cath tip is positioned in the proximal SVC. Mediastinum/Nodes: 14 mm right thyroid nodule is stable. Tiny scattered mediastinal  lymph nodes are similar to prior. 12 mm short axis right hilar node measured previously is 11 mm today. Small subcarinal node measured previously is stable. No left axillary adenopathy. The esophagus has normal imaging features. There is no axillary lymphadenopathy. Lungs/Pleura: Posterior right upper lobe pulmonary mass measures 3.3 x 1.4 cm today, decreased from 4.0 x 2.1 cm previously. Stable 6 mm subpleural nodule anterior right hemithorax. Additional scattered tiny pulmonary nodules are similar. No new suspicious pulmonary nodule or mass. Chronic underlying interstitial changes again noted. No pleural effusion. Upper Abdomen: 2.6 x 1.7 cm left adrenal nodule was 2.8 x 1.6 cm previously. Musculoskeletal: No worrisome lytic or sclerotic osseous abnormality. Advanced degenerative changes noted left shoulder, incompletely visualized. IMPRESSION: 1. Interval decrease in size of the posterior right upper lobe pulmonary mass. 2. Stable to slight decrease in size of the left adrenal nodule. 3. Stable 14 mm right thyroid nodule. Not clinically significant; no follow-up imaging recommended (ref: J Am Coll Radiol. 2015 Feb;12(2): 143-50). 4. Aortic Atherosclerosis (ICD10-I70.0). Electronically Signed   By: EMisty StanleyM.D.   On: 03/07/2020 14:25   DG Chest Portable 1 View  Result Date: 03/17/2020 CLINICAL DATA:  74year old male with shortness of breath, lower extremity swelling. History of non-small cell lung cancer. EXAM: PORTABLE CHEST 1 VIEW COMPARISON:  Restaging CT Chest 03/07/2020 and earlier. FINDINGS: Portable AP upright view at 1652 hours. Stable left chest power port. Stable lung volumes and mediastinal contours. Subtle by radiograph right upper lobe  mass as seen recently by CT (arrow). Elsewhere when allowing for portable technique the lungs are clear. No pneumothorax. Severe degeneration at the left shoulder. No acute osseous abnormality identified. Paucity of bowel gas in the upper abdomen.  IMPRESSION: No acute cardiopulmonary abnormality. Electronically Signed   By: Genevie Ann M.D.   On: 03/17/2020 16:59     ASSESSMENT:  1. Stage IV adenocarcinoma lung, PD-L1 50%, other actionable mutations negative: --Keytruda from 05/17/2016 through 09/24/2018 with mild progression. -4 cycles of carboplatin, pemetrexed from 12/25/2018 through 03/03/2019. -Pemetrexed maintenance from 03/24/2019 through 04/12/2019 discontinued secondary to renal insufficiency. -CT chest with contrast from 09/30/2019 shows right upper lobe lung lesion measuring 3.8 x 2.3)(4.2 x 2.6 cm). No change in the left upper lobe nodule. Subcarinal lymph node measures 11 mm, previously 7 mm. No definite left hilar adenopathy. Stable left adrenal nodule. -CT chest on 12/29/2019 shows 4 cm posterior right upper lobe mass grossly unchanged. Stable 12 mm right hilar node. Stable 2.8 cm left adrenal nodule. -Pembrolizumab held since 12/30/2019 for diarrhea. Last dose was on 12/09/2019. -Remicade 5 mg/kg on 01/27/2020.  Second dose on 03/09/2020.  2. Brain metastasis: -SRS to the right parietal lobe lesion with surrounding edema on 11/24/2018. -MRI of the brain on 09/16/2019 shows stable treated right parietal metastasis. No new mass or abnormal enhancement. -MRI of the brain on 12/23/2019 shows unchanged 6 mm treated enhancing lesion in the right parietal lobe. No new lesions.   PLAN:  1. Stage IV adenocarcinoma lung, PD-L1 50%, other actionable mutations negative: -Beryle Flock has been on hold since last dose 12/09/2019 secondary to immunotherapy related colitis. -CT chest with contrast on 03/07/2020 showed interval decrease in size of the posterior right upper lobe lung mass.  Stable to slight decrease in size of the left adrenal nodule. -Continue to hold immunotherapy at this time.  2. Brain metastasis: -No headaches or vision changes.  3.CKD: -Creatinine is 1.51 today.  He will receive hydration.  4. Peripheral  neuropathy: -Continue gabapentin and duloxetine.  5. Hypomagnesemia: -Magnesium is 1.5 today.  He is not taking magnesium at home on a consistent basis.  He will receive fluids with magnesium today.  6. Immunotherapy related colitis: -Remicade first dose on 01/27/2020 and second dose on 03/09/2020. -He continues to have diarrhea.  He reports that he has been taking Lomotil 2 tablets 4 times a day.  He is having 5-6 watery stools on a bad day and 2-3 on a good day. -Recommend stool studies and C. difficile. -Recommend vedolizumab (J Immunother Cancer. 2018;6(1):142. Epub 2018 Dec 5) which has response rates of around 65% in the setting of refractoriness to infliximab. -We will also make a referral to GI to see need for endoscopy. -Other options include combination of high-dose steroids and MMF (Melanoma Res. 2019;29(1):102).  Will consider this option if vedolizumab fails.  7.  Hypokalemia: -Potassium today is 3.0.  He will receive potassium with fluids.   Orders placed this encounter:  No orders of the defined types were placed in this encounter.    Derek Jack, MD [place] [phone]   I, Milinda Antis, am acting as a scribe for Dr. Sanda Linger.  I, Derek Jack MD, have reviewed the above documentation for accuracy and completeness, and I agree with the above.

## 2020-03-22 NOTE — Patient Instructions (Signed)
[place] at [facility] Discharge Instructions  Received IV hydration with potassium and magnesium today. Follow-up as scheduled   Thank you for choosing [place] at [facility] to provide your oncology and hematology care.  To afford each patient quality time with our provider, please arrive at least 15 minutes before your scheduled appointment time.   If you have a lab appointment with the [facility] please come in thru the Main Entrance and check in at the main information desk.  You need to re-schedule your appointment should you arrive 10 or more minutes late.  We strive to give you quality time with our providers, and arriving late affects you and other patients whose appointments are after yours.  Also, if you no show three or more times for appointments you may be dismissed from the clinic at the providers discretion.     Again, thank you for choosing [facility].  Our hope is that these requests will decrease the amount of time that you wait before being seen by our physicians.       _____________________________________________________________  Should you have questions after your visit to [facility], please contact our office at [phone] and follow the prompts.  Our office hours are 8:00 a.m. and 4:30 p.m. Monday - Friday.  Please note that voicemails left after 4:00 p.m. may not be returned until the following business day.  We are closed weekends and major holidays.  You do have access to a nurse 24-7, just call the main number to the clinic [phone] and do not press any options, hold on the line and a nurse will answer the phone.    For prescription refill requests, have your pharmacy contact our office and allow 72 hours.    Due to Covid, you will need to wear a mask upon entering the hospital. If you do not have a mask, a mask will be given to you at the Main Entrance upon arrival. For doctor  visits, patients may have 1 support person age 75 or older with them. For treatment visits, patients can not have anyone with them due to social distancing guidelines and our immunocompromised population.

## 2020-03-22 NOTE — Progress Notes (Signed)
1500 Labs reviewed with and pt seen by Dr. Delton Coombes and pt to receive 1 liter of NS with 20 meq of potassium and 2 grams of magnesium IV over 1 hour as well as Solumedrol 125 mg IV per MD order.                                                                                  Darrell Christensen tolerated IV hydration well without complaints or incident. Pt discharged via wheelchair in satisfactory condition accompanied by his daughter

## 2020-03-22 NOTE — Patient Instructions (Addendum)
[place] at [facility] Discharge Instructions  You were seen today by Dr. Delton Coombes. He went over your recent results. You received fluids today. Dr. Delton Coombes will see you back in 2 weeks for labs and follow up.   Thank you for choosing [place] at [facility] to provide your oncology and hematology care.  To afford each patient quality time with our provider, please arrive at least 15 minutes before your scheduled appointment time.   If you have a lab appointment with the [facility] please come in thru the Main Entrance and check in at the main information desk  You need to re-schedule your appointment should you arrive 10 or more minutes late.  We strive to give you quality time with our providers, and arriving late affects you and other patients whose appointments are after yours.  Also, if you no show three or more times for appointments you may be dismissed from the clinic at the providers discretion.     Again, thank you for choosing [facility].  Our hope is that these requests will decrease the amount of time that you wait before being seen by our physicians.       _____________________________________________________________  Should you have questions after your visit to [facility], please contact our office at [phone] between the hours of 8:00 a.m. and 4:30 p.m.  Voicemails left after 4:00 p.m. will not be returned until the following business day.  For prescription refill requests, have your pharmacy contact our office and allow 72 hours.    Cancer Center Support Programs:   > Cancer Support Group  2nd Tuesday of the month 1pm-2pm, Journey Room

## 2020-03-23 ENCOUNTER — Other Ambulatory Visit (HOSPITAL_COMMUNITY): Payer: Self-pay | Admitting: Hematology

## 2020-03-23 ENCOUNTER — Other Ambulatory Visit (HOSPITAL_COMMUNITY): Payer: Self-pay

## 2020-03-23 DIAGNOSIS — R197 Diarrhea, unspecified: Secondary | ICD-10-CM

## 2020-03-23 DIAGNOSIS — E860 Dehydration: Secondary | ICD-10-CM

## 2020-03-24 ENCOUNTER — Other Ambulatory Visit: Payer: Self-pay

## 2020-03-24 ENCOUNTER — Emergency Department (HOSPITAL_COMMUNITY): Payer: Medicare Other

## 2020-03-24 ENCOUNTER — Encounter (HOSPITAL_COMMUNITY): Payer: Self-pay | Admitting: Emergency Medicine

## 2020-03-24 ENCOUNTER — Inpatient Hospital Stay (HOSPITAL_COMMUNITY): Payer: Medicare Other

## 2020-03-24 ENCOUNTER — Inpatient Hospital Stay (HOSPITAL_COMMUNITY)
Admission: EM | Admit: 2020-03-24 | Discharge: 2020-03-31 | DRG: 871 | Disposition: A | Discharge status: Skilled Nursing Facility | Payer: Medicare Other | Attending: Internal Medicine | Admitting: Internal Medicine

## 2020-03-24 DIAGNOSIS — A419 Sepsis, unspecified organism: Secondary | ICD-10-CM | POA: Diagnosis not present

## 2020-03-24 DIAGNOSIS — C7931 Secondary malignant neoplasm of brain: Secondary | ICD-10-CM | POA: Diagnosis not present

## 2020-03-24 DIAGNOSIS — C3490 Malignant neoplasm of unspecified part of unspecified bronchus or lung: Secondary | ICD-10-CM | POA: Diagnosis not present

## 2020-03-24 DIAGNOSIS — J9621 Acute and chronic respiratory failure with hypoxia: Secondary | ICD-10-CM | POA: Diagnosis present

## 2020-03-24 DIAGNOSIS — G9340 Encephalopathy, unspecified: Secondary | ICD-10-CM | POA: Diagnosis present

## 2020-03-24 DIAGNOSIS — R4182 Altered mental status, unspecified: Secondary | ICD-10-CM

## 2020-03-24 DIAGNOSIS — I517 Cardiomegaly: Secondary | ICD-10-CM | POA: Diagnosis not present

## 2020-03-24 DIAGNOSIS — Z20822 Contact with and (suspected) exposure to covid-19: Secondary | ICD-10-CM | POA: Diagnosis present

## 2020-03-24 DIAGNOSIS — E119 Type 2 diabetes mellitus without complications: Secondary | ICD-10-CM

## 2020-03-24 DIAGNOSIS — J9601 Acute respiratory failure with hypoxia: Secondary | ICD-10-CM | POA: Diagnosis present

## 2020-03-24 DIAGNOSIS — I10 Essential (primary) hypertension: Secondary | ICD-10-CM | POA: Diagnosis present

## 2020-03-24 DIAGNOSIS — Z66 Do not resuscitate: Secondary | ICD-10-CM | POA: Diagnosis present

## 2020-03-24 DIAGNOSIS — R6510 Systemic inflammatory response syndrome (SIRS) of non-infectious origin without acute organ dysfunction: Secondary | ICD-10-CM

## 2020-03-24 DIAGNOSIS — Z8249 Family history of ischemic heart disease and other diseases of the circulatory system: Secondary | ICD-10-CM

## 2020-03-24 DIAGNOSIS — J811 Chronic pulmonary edema: Secondary | ICD-10-CM

## 2020-03-24 DIAGNOSIS — I5041 Acute combined systolic (congestive) and diastolic (congestive) heart failure: Secondary | ICD-10-CM | POA: Diagnosis present

## 2020-03-24 DIAGNOSIS — J189 Pneumonia, unspecified organism: Secondary | ICD-10-CM | POA: Diagnosis not present

## 2020-03-24 DIAGNOSIS — E11649 Type 2 diabetes mellitus with hypoglycemia without coma: Secondary | ICD-10-CM | POA: Diagnosis present

## 2020-03-24 DIAGNOSIS — G9341 Metabolic encephalopathy: Secondary | ICD-10-CM | POA: Diagnosis present

## 2020-03-24 DIAGNOSIS — R531 Weakness: Secondary | ICD-10-CM

## 2020-03-24 DIAGNOSIS — D72829 Elevated white blood cell count, unspecified: Secondary | ICD-10-CM

## 2020-03-24 DIAGNOSIS — R0902 Hypoxemia: Secondary | ICD-10-CM

## 2020-03-24 DIAGNOSIS — Z79890 Hormone replacement therapy (postmenopausal): Secondary | ICD-10-CM

## 2020-03-24 DIAGNOSIS — R296 Repeated falls: Secondary | ICD-10-CM | POA: Diagnosis present

## 2020-03-24 DIAGNOSIS — Z87891 Personal history of nicotine dependence: Secondary | ICD-10-CM

## 2020-03-24 DIAGNOSIS — I5021 Acute systolic (congestive) heart failure: Secondary | ICD-10-CM

## 2020-03-24 DIAGNOSIS — E440 Moderate protein-calorie malnutrition: Secondary | ICD-10-CM | POA: Diagnosis present

## 2020-03-24 DIAGNOSIS — I130 Hypertensive heart and chronic kidney disease with heart failure and stage 1 through stage 4 chronic kidney disease, or unspecified chronic kidney disease: Secondary | ICD-10-CM | POA: Diagnosis present

## 2020-03-24 DIAGNOSIS — Z7952 Long term (current) use of systemic steroids: Secondary | ICD-10-CM

## 2020-03-24 DIAGNOSIS — E876 Hypokalemia: Secondary | ICD-10-CM

## 2020-03-24 DIAGNOSIS — N1832 Chronic kidney disease, stage 3b: Secondary | ICD-10-CM | POA: Diagnosis present

## 2020-03-24 DIAGNOSIS — E1122 Type 2 diabetes mellitus with diabetic chronic kidney disease: Secondary | ICD-10-CM | POA: Diagnosis present

## 2020-03-24 DIAGNOSIS — Z7984 Long term (current) use of oral hypoglycemic drugs: Secondary | ICD-10-CM

## 2020-03-24 DIAGNOSIS — C3411 Malignant neoplasm of upper lobe, right bronchus or lung: Secondary | ICD-10-CM | POA: Diagnosis present

## 2020-03-24 DIAGNOSIS — M7989 Other specified soft tissue disorders: Secondary | ICD-10-CM

## 2020-03-24 DIAGNOSIS — E785 Hyperlipidemia, unspecified: Secondary | ICD-10-CM | POA: Diagnosis present

## 2020-03-24 DIAGNOSIS — E1140 Type 2 diabetes mellitus with diabetic neuropathy, unspecified: Secondary | ICD-10-CM

## 2020-03-24 DIAGNOSIS — Z86711 Personal history of pulmonary embolism: Secondary | ICD-10-CM

## 2020-03-24 DIAGNOSIS — Z515 Encounter for palliative care: Secondary | ICD-10-CM

## 2020-03-24 DIAGNOSIS — Y95 Nosocomial condition: Secondary | ICD-10-CM | POA: Diagnosis present

## 2020-03-24 DIAGNOSIS — I959 Hypotension, unspecified: Secondary | ICD-10-CM

## 2020-03-24 DIAGNOSIS — R0600 Dyspnea, unspecified: Secondary | ICD-10-CM

## 2020-03-24 DIAGNOSIS — Z823 Family history of stroke: Secondary | ICD-10-CM

## 2020-03-24 DIAGNOSIS — W19XXXA Unspecified fall, initial encounter: Secondary | ICD-10-CM

## 2020-03-24 DIAGNOSIS — R6520 Severe sepsis without septic shock: Secondary | ICD-10-CM | POA: Diagnosis present

## 2020-03-24 DIAGNOSIS — Z79899 Other long term (current) drug therapy: Secondary | ICD-10-CM

## 2020-03-24 DIAGNOSIS — Y92009 Unspecified place in unspecified non-institutional (private) residence as the place of occurrence of the external cause: Secondary | ICD-10-CM

## 2020-03-24 DIAGNOSIS — E8809 Other disorders of plasma-protein metabolism, not elsewhere classified: Secondary | ICD-10-CM

## 2020-03-24 LAB — COMPREHENSIVE METABOLIC PANEL
ALT: 22 U/L (ref 0–44)
AST: 18 U/L (ref 15–41)
Albumin: 2.7 g/dL — ABNORMAL LOW (ref 3.5–5.0)
Alkaline Phosphatase: 50 U/L (ref 38–126)
Anion gap: 9 (ref 5–15)
BUN: 17 mg/dL (ref 8–23)
CO2: 27 mmol/L (ref 22–32)
Calcium: 7.7 mg/dL — ABNORMAL LOW (ref 8.9–10.3)
Chloride: 101 mmol/L (ref 98–111)
Creatinine, Ser: 1.55 mg/dL — ABNORMAL HIGH (ref 0.61–1.24)
GFR, Estimated: 46 mL/min — ABNORMAL LOW (ref >60)
Glucose, Bld: 98 mg/dL (ref 70–99)
Potassium: 2.8 mmol/L — ABNORMAL LOW (ref 3.5–5.1)
Sodium: 137 mmol/L (ref 135–145)
Total Bilirubin: 0.9 mg/dL (ref 0.3–1.2)
Total Protein: 5.7 g/dL — ABNORMAL LOW (ref 6.5–8.1)

## 2020-03-24 LAB — BLOOD GAS, ARTERIAL
Acid-Base Excess: 3.2 mmol/L — ABNORMAL HIGH (ref 0.0–2.0)
Bicarbonate: 27.3 mmol/L (ref 20.0–28.0)
FIO2: 32
O2 Saturation: 93.5 %
Patient temperature: 37
pCO2 arterial: 37.6 mmHg (ref 32.0–48.0)
pH, Arterial: 7.466 — ABNORMAL HIGH (ref 7.350–7.450)
pO2, Arterial: 66.4 mmHg — ABNORMAL LOW (ref 83.0–108.0)

## 2020-03-24 LAB — CBC WITH DIFFERENTIAL/PLATELET
Abs Immature Granulocytes: 0.11 10*3/uL — ABNORMAL HIGH (ref 0.00–0.07)
Basophils Absolute: 0 10*3/uL (ref 0.0–0.1)
Basophils Relative: 0 %
Eosinophils Absolute: 0 10*3/uL (ref 0.0–0.5)
Eosinophils Relative: 0 %
HCT: 35.7 % — ABNORMAL LOW (ref 39.0–52.0)
Hemoglobin: 11.7 g/dL — ABNORMAL LOW (ref 13.0–17.0)
Immature Granulocytes: 1 %
Lymphocytes Relative: 22 %
Lymphs Abs: 3.4 10*3/uL (ref 0.7–4.0)
MCH: 29.9 pg (ref 26.0–34.0)
MCHC: 32.8 g/dL (ref 30.0–36.0)
MCV: 91.3 fL (ref 80.0–100.0)
Monocytes Absolute: 1.3 10*3/uL — ABNORMAL HIGH (ref 0.1–1.0)
Monocytes Relative: 8 %
Neutro Abs: 11 10*3/uL — ABNORMAL HIGH (ref 1.7–7.7)
Neutrophils Relative %: 69 %
Platelets: 238 10*3/uL (ref 150–400)
RBC: 3.91 MIL/uL — ABNORMAL LOW (ref 4.22–5.81)
RDW: 16.3 % — ABNORMAL HIGH (ref 11.5–15.5)
WBC: 15.8 10*3/uL — ABNORMAL HIGH (ref 4.0–10.5)
nRBC: 0 % (ref 0.0–0.2)

## 2020-03-24 LAB — RESP PANEL BY RT-PCR (FLU A&B, COVID) ARPGX2
Influenza A by PCR: NEGATIVE
Influenza B by PCR: NEGATIVE
SARS Coronavirus 2 by RT PCR: NEGATIVE

## 2020-03-24 LAB — PROTIME-INR
INR: 1.3 — ABNORMAL HIGH (ref 0.8–1.2)
Prothrombin Time: 15.4 seconds — ABNORMAL HIGH (ref 11.4–15.2)

## 2020-03-24 LAB — LACTIC ACID, PLASMA
Lactic Acid, Venous: 1.8 mmol/L (ref 0.5–1.9)
Lactic Acid, Venous: 1.3 mmol/L (ref 0.5–1.9)

## 2020-03-24 MED ORDER — VANCOMYCIN HCL 1500 MG/300ML IV SOLN: 1500.0000 mg | INTRAVENOUS | Status: DC

## 2020-03-24 MED ORDER — SODIUM CHLORIDE 0.9 % IV BOLUS
1000.0000 mL | Freq: Once | INTRAVENOUS | Status: AC
  Administered 2020-03-24: 1000 mL via INTRAVENOUS

## 2020-03-24 MED ORDER — SODIUM CHLORIDE 0.9 % IV BOLUS (SEPSIS)
1000.0000 mL | Freq: Once | INTRAVENOUS | Status: AC
  Administered 2020-03-24: 1000 mL via INTRAVENOUS

## 2020-03-24 MED ORDER — POTASSIUM CHLORIDE CRYS ER 20 MEQ PO TBCR
40.0000 meq | EXTENDED_RELEASE_TABLET | Freq: Once | ORAL | Status: AC
  Administered 2020-03-24: 40 meq via ORAL
  Filled 2020-03-24: qty 2

## 2020-03-24 MED ORDER — SODIUM CHLORIDE 0.9 % IV SOLN
2.0000 g | Freq: Once | INTRAVENOUS | Status: AC
  Administered 2020-03-24: 2 g via INTRAVENOUS
  Filled 2020-03-24: qty 2

## 2020-03-24 MED ORDER — SODIUM CHLORIDE 0.9 % IV SOLN
2.0000 g | Freq: Two times a day (BID) | INTRAVENOUS | Status: DC
  Administered 2020-03-25 – 2020-03-29 (×9): 2 g via INTRAVENOUS
  Filled 2020-03-24 (×10): qty 2

## 2020-03-24 MED ORDER — METRONIDAZOLE 500 MG PO TABS
500.0000 mg | ORAL_TABLET | Freq: Three times a day (TID) | ORAL | Status: DC
  Administered 2020-03-24 – 2020-03-25 (×2): 500 mg via ORAL
  Filled 2020-03-24 (×2): qty 1

## 2020-03-24 MED ORDER — VANCOMYCIN HCL IN DEXTROSE 1-5 GM/200ML-% IV SOLN
1000.0000 mg | Freq: Once | INTRAVENOUS | Status: AC
  Administered 2020-03-24: 1000 mg via INTRAVENOUS
  Filled 2020-03-24: qty 200

## 2020-03-24 MED ORDER — SODIUM CHLORIDE 0.9 % IV SOLN: INTRAVENOUS | Status: AC

## 2020-03-24 NOTE — ED Notes (Signed)
Pt is lethargic/sedated vs?  His O2 sats delined to 89 per cent RA   O2 added at 3L

## 2020-03-24 NOTE — Progress Notes (Signed)
Pharmacy Antibiotic Note  Darrell Christensen is a 74 y.o. male admitted on 03/24/2020 with sepsis.  Pharmacy has been consulted for Vancomycin / Cefepime  dosing.  Plan: Vancomycin 1 gram iv x 1 dose now to make 2 gram dose Vancomycin 1500 mg iv Q 24 starting tomorrow Cefepime 2 grams iv Q 12 hours  Follow up Scr, cultures, progress  Height: 5\' 4"  (162.6 cm) Weight: 124.7 kg (275 lb) IBW/kg (Calculated) : 59.2  Temp (24hrs), Avg:97.9 F (36.6 C), Min:97.9 F (36.6 C), Max:97.9 F (36.6 C)  Recent Labs  Lab 03/18/20 0510 03/21/20 1023 03/24/20 1857  WBC 10.4 10.2 15.8*  CREATININE 1.46* 1.51* 1.55*  LATICACIDVEN  --   --  1.8    Estimated Creatinine Clearance: 49.7 mL/min (A) (by C-G formula based on SCr of 1.55 mg/dL (H)).    No Known Allergies   Thank you for allowing pharmacy to be a part of this patient's care.  Tad Moore 03/24/2020 8:06 PM

## 2020-03-24 NOTE — ED Provider Notes (Signed)
[facility] EMERGENCY DEPARTMENT Provider Note   CSN: [id number] Arrival date & time: 03/24/20  1804     History Chief Complaint  Patient presents with  . Foot Swelling    Darrell Christensen is a 74 y.o. male.  Patient brought in by POV.  Was brought in by granddaughter.  Patient lives by himself.  He is followed by hematology oncology clinic upstairs.  He has stage IV lung CA known to have metastatic disease to the brain.  Is actively undergoing chemotherapy.  Is not on oxygen at home.  Was seen in the heme-onc clinic just 2 days ago and had IV fluids given.  Patient had a fall and was too weak to get up.  Family member checked on him and found him down was able to get him up.  That resulted in him being brought in here for evaluation.  Family states that he is unable to live by himself anymore and that he needs facility care.  They did give him a Xanax right prior to coming in because he has a tendency to leave and they didn't want him to leave this time.  So this could explain him being fairly drowsy.  Patient's blood pressures on arrival were in the low 90s.  Oxygen sats on room air were below 90%.  Patient's only complaint was his bilateral leg swelling.        Past Medical History:  Diagnosis Date  . 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 Diabetes mellitus ([place]) 05/02/2016  . Diabetes mellitus without complication ([place])   . Hypertension   . Mass of breast, right 04/01/2016  . Mass of upper lobe of right lung 04/01/2016   2 adjacent, 5 cm masses, hilar adenopathy  . Primary cancer of right upper lobe of lung ([place]) 04/01/2016   2 adjacent, 5 cm masses, hilar adenopathy  . Pulmonary embolus ([place]) 03/31/2016  . 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 Acute encephalopathy 03/17/2020  . Dehydration 04/14/2019  . Brain metastasis ([place]) 11/27/2018  . Goals of care, counseling/discussion 10/15/2018  . Abnormal CT of the abdomen  07/22/2017  . Itching 10/10/2016  . Diabetes mellitus ([place]) 05/02/2016  . 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 Primary cancer of right upper lobe of lung ([place]) 04/01/2016  . Breast nodule 04/01/2016  . 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 HTN (hypertension), benign 04/01/2016  . Pulmonary embolus ([place]) 03/31/2016    Past Surgical History:  Procedure Laterality Date  . COLONOSCOPY N/A 07/28/2017   Procedure: COLONOSCOPY;  Surgeon: Rogene Houston, MD;  Location: [facility] ENDO SUITE;  Service: Endoscopy;  Laterality: N/A;  205  . PORTACATH PLACEMENT Left 05/13/2016   Procedure: INSERTION PORT-A-CATH;  Surgeon: Aviva Signs, MD;  Location: [facility] ORS;  Service: General;  Laterality: Left;       Family History  Problem Relation Age of Onset  . Stroke Mother   . Heart attack Father   . Heart attack Brother     Social History   Tobacco Use  . Smoking status: Former Smoker    Years: 61.00    Quit date: 04/11/2014    Years since quitting: 5.9  . Smokeless tobacco: Never Used  . Tobacco comment: patient does vape smoking x 2 years  Vaping Use  . Vaping Use: Every day  Substance Use Topics  . Alcohol use: Not Currently    Comment: quit 10 years  .  Drug use: No    Home Medications Prior to Admission medications   Medication Sig Start Date End Date Taking? Authorizing Provider  ALPRAZolam [name]) 1 MG tablet Take 1 mg by mouth 2 (two) times daily as needed for anxiety.   Yes [provider]  amLODipine (NORVASC) 2.5 MG tablet Take 2.5 mg by mouth daily. 03/22/16  Yes [provider]  diphenoxylate-atropine (LOMOTIL) 2.5-0.025 MG tablet Take 2 tablets 4 times per day. 03/08/20  Yes Derek Jack, MD  DULoxetine (CYMBALTA) 60 MG capsule Take 1 capsule (60 mg total) by mouth daily. 01/26/20  Yes Derek Jack, MD  famotidine (PEPCID) 20 MG tablet Take 1 tablet (20 mg total) by mouth daily. 01/13/20  Yes Derek Jack, MD  folic acid (FOLVITE) 1 MG tablet Take 1  tablet (1 mg total) by mouth daily. 03/18/20  Yes Johnson, Clanford L, MD  GLIPIZIDE XL 5 MG 24 hr tablet Take 5 mg by mouth daily.  03/22/16  Yes [provider]  hydrocortisone 2.5 % lotion Apply topically as needed.  02/13/19  Yes [provider]  lidocaine-prilocaine (EMLA) cream APPLY A QUARTER SIZE AMOUNT TO THE AFFECTED AREA 1 HOUR PRIOR TO COMING TO CHEMOTHERAPY. COVER WITH A PLASTIC WRAP. 01/07/19  Yes Lockamy, Randi L, NP-C  magnesium oxide (MAG-OX) 400 (241.3 Mg) MG tablet Take 1 tablet (400 mg total) by mouth 2 (two) times daily. 04/14/19  Yes Derek Jack, MD  metFORMIN (GLUCOPHAGE) 500 MG tablet Take 500 mg by mouth 2 (two) times daily. 03/07/16  Yes [provider]  metoprolol succinate (TOPROL-XL) 25 MG 24 hr tablet TAKE ONE TABLET BY MOUTH IN THE EVENING. 03/26/17  Yes Holley Bouche, NP  oxyCODONE-acetaminophen (PERCOCET) 10-325 MG tablet Take 1 tablet by mouth as needed.  06/18/17  Yes [provider]  Pembrolizumab (KEYTRUDA IV) Inject into the vein. 12/16/18  Yes [provider]  potassium chloride (K-DUR,KLOR-CON) 10 MEQ tablet Take 10 mEq by mouth daily.  01/16/18  Yes [provider]  predniSONE (DELTASONE) 20 MG tablet Take 2.5 tablets (50 mg total) by mouth daily with breakfast. 01/20/20  Yes Derek Jack, MD  prochlorperazine (COMPAZINE) 10 MG tablet Take 1 tablet (10 mg total) by mouth every 6 (six) hours as needed (Nausea or vomiting). 10/15/18  Yes Derek Jack, MD  simvastatin (ZOCOR) 20 MG tablet Take 20 mg by mouth every evening. 03/07/16  Yes [provider]  torsemide (DEMADEX) 20 MG tablet Take 1 tablet (20 mg total) by mouth daily. 01/27/19  Yes Derek Jack, MD  lactulose (CHRONULAC) 10 GM/15ML solution Take 30 mLs (20 g total) by mouth at bedtime as needed for mild constipation. 08/06/19   Derek Jack, MD  ondansetron (ZOFRAN) 4 MG tablet Take 1 tablet (4 mg total) by  mouth every 8 (eight) hours as needed for nausea or vomiting. 11/03/18   Triplett, Tammy, PA-C    Allergies    Patient has no known allergies.  Review of Systems   Review of Systems  Unable to perform ROS: Mental status change    Physical Exam Updated Vital Signs BP 113/71   Pulse 67   Temp 97.9 F (36.6 C) (Oral)   Resp (!) 25   Ht 1.626 m (5\' 4" )   Wt 124.7 kg   SpO2 92%   BMI 47.20 kg/m   Physical Exam Vitals and nursing note reviewed.  Constitutional:      Appearance: Normal appearance. He is well-developed.  HENT:  Head: Normocephalic and atraumatic.  Eyes:     Extraocular Movements: Extraocular movements intact.     Conjunctiva/sclera: Conjunctivae normal.     Pupils: Pupils are equal, round, and reactive to light.  Cardiovascular:     Rate and Rhythm: Normal rate and regular rhythm.     Heart sounds: No murmur heard.   Pulmonary:     Effort: Pulmonary effort is normal. No respiratory distress.     Breath sounds: Normal breath sounds.  Abdominal:     Palpations: Abdomen is soft.     Tenderness: There is no abdominal tenderness.  Musculoskeletal:        General: Swelling present.     Cervical back: Normal range of motion and neck supple.     Right lower leg: Edema present.     Left lower leg: Edema present.  Skin:    General: Skin is warm and dry.  Neurological:     Mental Status: He is alert.     Comments: Patient very drowsy.  But will wake up will answer some questions will move all 4 extremities.     ED Results / Procedures / Treatments   Labs (all labs ordered are listed, but only abnormal results are displayed) Labs Reviewed  COMPREHENSIVE METABOLIC PANEL - Abnormal; Notable for the following components:      Result Value   Potassium 2.8 (*)    Creatinine, Ser 1.55 (*)    Calcium 7.7 (*)    Total Protein 5.7 (*)    Albumin 2.7 (*)    GFR, Estimated 46 (*)    All other components within normal limits  CBC WITH DIFFERENTIAL/PLATELET -  Abnormal; Notable for the following components:   WBC 15.8 (*)    RBC 3.91 (*)    Hemoglobin 11.7 (*)    HCT 35.7 (*)    RDW 16.3 (*)    Neutro Abs 11.0 (*)    Monocytes Absolute 1.3 (*)    Abs Immature Granulocytes 0.11 (*)    All other components within normal limits  PROTIME-INR - Abnormal; Notable for the following components:   Prothrombin Time 15.4 (*)    INR 1.3 (*)    All other components within normal limits  BLOOD GAS, ARTERIAL - Abnormal; Notable for the following components:   pH, Arterial 7.466 (*)    pO2, Arterial 66.4 (*)    Acid-Base Excess 3.2 (*)    All other components within normal limits  RESP PANEL BY RT-PCR (FLU A&B, COVID) ARPGX2  CULTURE, BLOOD (ROUTINE X 2)  CULTURE, BLOOD (ROUTINE X 2)  LACTIC ACID, PLASMA  LACTIC ACID, PLASMA  URINALYSIS, ROUTINE W REFLEX MICROSCOPIC    EKG None  Radiology DG Chest Port 1 View  Result Date: 03/24/2020 CLINICAL DATA:  Suspected sepsis.  Metastatic lung cancer. EXAM: PORTABLE CHEST 1 VIEW COMPARISON:  03/17/2020 FINDINGS: Left Port-A-Cath remains in place, unchanged. Cardiomegaly. Low lung volumes. Right upper lobe nodule/mass seen on prior CT not appreciable on plain film. Chronic interstitial changes. Bibasilar airspace opacities. No effusions or acute bony abnormality. IMPRESSION: Low lung volumes.  Bibasilar atelectasis or infiltrates. Cardiomegaly. Chronic interstitial lung disease. Electronically Signed   By: Rolm Baptise M.D.   On: 03/24/2020 20:18    Procedures Procedures (including critical care time)  CRITICAL CARE Performed by: Fredia Sorrow Total critical care time: 60 minutes Critical care time was exclusive of separately billable procedures and treating other patients. Critical care was necessary to treat or prevent imminent or life-threatening  deterioration. Critical care was time spent personally by me on the following activities: development of treatment plan with patient and/or surrogate as  well as nursing, discussions with consultants, evaluation of patient's response to treatment, examination of patient, obtaining history from patient or surrogate, ordering and performing treatments and interventions, ordering and review of laboratory studies, ordering and review of radiographic studies, pulse oximetry and re-evaluation of patient's condition.   Medications Ordered in ED Medications  0.9 %  sodium chloride infusion ( Intravenous New Bag/Given 03/24/20 1920)  metroNIDAZOLE (FLAGYL) tablet 500 mg (500 mg Oral Given 03/24/20 2152)  vancomycin (VANCOREADY) IVPB 1500 mg/300 mL (has no administration in time range)  ceFEPIme (MAXIPIME) 2 g in sodium chloride 0.9 % 100 mL IVPB (has no administration in time range)  sodium chloride 0.9 % bolus 1,000 mL (0 mLs Intravenous Stopped 03/24/20 2020)  sodium chloride 0.9 % bolus 1,000 mL (0 mLs Intravenous Stopped 03/24/20 2032)    And  sodium chloride 0.9 % bolus 1,000 mL (0 mLs Intravenous Stopped 03/24/20 2054)    And  sodium chloride 0.9 % bolus 1,000 mL (0 mLs Intravenous Stopped 03/24/20 2132)    And  sodium chloride 0.9 % bolus 1,000 mL (0 mLs Intravenous Stopped 03/24/20 2349)  ceFEPIme (MAXIPIME) 2 g in sodium chloride 0.9 % 100 mL IVPB (0 g Intravenous Stopped 03/24/20 2009)  vancomycin (VANCOCIN) IVPB 1000 mg/200 mL premix (0 mg Intravenous Stopped 03/24/20 2048)  vancomycin (VANCOCIN) IVPB 1000 mg/200 mL premix (0 mg Intravenous Stopped 03/24/20 2154)  potassium chloride SA (KLOR-CON) CR tablet 40 mEq (40 mEq Oral Given 03/24/20 2151)  iohexol (OMNIPAQUE) 350 MG/ML injection 100 mL (100 mLs Intravenous Contrast Given 03/25/20 0011)    ED Course  I have reviewed the triage vital signs and the nursing notes.  Pertinent labs & imaging results that were available during my care of the patient were reviewed by me and considered in my medical decision making (see chart for details).    MDM Rules/Calculators/A&P                           Patient with new hypoxia.  Patient with also mental status.  Patient with chest x-ray raising concerns for pneumonia.  But not definitive.  Patient with some marginal blood pressures.  Respiratory rate was up.  Patient with hypoxia.  Patient also with a leukocytosis over 12,000.  So started the sepsis criteria.  Patient's lactic acid and not being elevated.  Patient did receive the 30 cc/kg of fluids.  Patient received broad-spectrum antibiotics.  Patient on on 2 to 4 L of oxygen here his oxygen sats in the low 90s.  Patient with an altered mental status.  Did have a fall at home prior to arrival.  And was too weak to get up.  So CT head is pending.  Hypoxia and some leg swelling with left greater than right patient will get CT angio.  Covid testing negative.  Has some mild hypokalemia at 2.8 received 40 mEq of potassium orally.  Due to the fall and having a history of brain mets in the stage IV lung CA.  Need to make sure no significant head trauma.  Patient will require admission family knows that.  Once CT angio and CT head is known.  Turned over to the overnight physician Dr. Tomi Bamberger.  He will follow up on the 2 CAT scans.  And formalize the admission.  Patient able to move  both lower extremities.  No evidence of any deformity or concern for hip fracture.   Final Clinical Impression(s) / ED Diagnoses Final diagnoses:  Altered mental status, unspecified altered mental status type  Hypoxia  SIRS (systemic inflammatory response syndrome) (HCC)  Hypokalemia  Fall, initial encounter  Lung cancer metastatic to brain [facility])  Community acquired pneumonia, unspecified laterality    Rx / DC Orders ED Discharge Orders    None       Fredia Sorrow, MD 03/25/20 [phone]

## 2020-03-24 NOTE — ED Triage Notes (Signed)
Pt with metastatic lung ca  Here for bilateral foot swelling  Not on home O2

## 2020-03-24 NOTE — ED Notes (Signed)
Received call from patient's grand daughter. Granddaughter asks that she be contacted if patient begins to get agitated. Phone # [phone]

## 2020-03-25 ENCOUNTER — Inpatient Hospital Stay (HOSPITAL_COMMUNITY): Payer: Medicare Other

## 2020-03-25 DIAGNOSIS — J811 Chronic pulmonary edema: Secondary | ICD-10-CM | POA: Diagnosis not present

## 2020-03-25 DIAGNOSIS — I10 Essential (primary) hypertension: Secondary | ICD-10-CM

## 2020-03-25 DIAGNOSIS — C3411 Malignant neoplasm of upper lobe, right bronchus or lung: Secondary | ICD-10-CM | POA: Diagnosis present

## 2020-03-25 DIAGNOSIS — A419 Sepsis, unspecified organism: Secondary | ICD-10-CM | POA: Diagnosis present

## 2020-03-25 DIAGNOSIS — R296 Repeated falls: Secondary | ICD-10-CM | POA: Diagnosis present

## 2020-03-25 DIAGNOSIS — G9340 Encephalopathy, unspecified: Secondary | ICD-10-CM

## 2020-03-25 DIAGNOSIS — R0902 Hypoxemia: Secondary | ICD-10-CM | POA: Diagnosis not present

## 2020-03-25 DIAGNOSIS — R531 Weakness: Secondary | ICD-10-CM

## 2020-03-25 DIAGNOSIS — C7931 Secondary malignant neoplasm of brain: Secondary | ICD-10-CM | POA: Diagnosis present

## 2020-03-25 DIAGNOSIS — J810 Acute pulmonary edema: Secondary | ICD-10-CM

## 2020-03-25 DIAGNOSIS — E11649 Type 2 diabetes mellitus with hypoglycemia without coma: Secondary | ICD-10-CM | POA: Diagnosis present

## 2020-03-25 DIAGNOSIS — E1165 Type 2 diabetes mellitus with hyperglycemia: Secondary | ICD-10-CM | POA: Diagnosis not present

## 2020-03-25 DIAGNOSIS — E8809 Other disorders of plasma-protein metabolism, not elsewhere classified: Secondary | ICD-10-CM

## 2020-03-25 DIAGNOSIS — E876 Hypokalemia: Secondary | ICD-10-CM | POA: Diagnosis present

## 2020-03-25 DIAGNOSIS — E7849 Other hyperlipidemia: Secondary | ICD-10-CM | POA: Diagnosis not present

## 2020-03-25 DIAGNOSIS — W19XXXA Unspecified fall, initial encounter: Secondary | ICD-10-CM

## 2020-03-25 DIAGNOSIS — R4182 Altered mental status, unspecified: Secondary | ICD-10-CM | POA: Diagnosis not present

## 2020-03-25 DIAGNOSIS — J189 Pneumonia, unspecified organism: Secondary | ICD-10-CM | POA: Diagnosis present

## 2020-03-25 DIAGNOSIS — E785 Hyperlipidemia, unspecified: Secondary | ICD-10-CM | POA: Diagnosis present

## 2020-03-25 DIAGNOSIS — Z79899 Other long term (current) drug therapy: Secondary | ICD-10-CM | POA: Diagnosis not present

## 2020-03-25 DIAGNOSIS — Z7952 Long term (current) use of systemic steroids: Secondary | ICD-10-CM | POA: Diagnosis not present

## 2020-03-25 DIAGNOSIS — I5021 Acute systolic (congestive) heart failure: Secondary | ICD-10-CM

## 2020-03-25 DIAGNOSIS — G9341 Metabolic encephalopathy: Secondary | ICD-10-CM | POA: Diagnosis present

## 2020-03-25 DIAGNOSIS — N1832 Chronic kidney disease, stage 3b: Secondary | ICD-10-CM | POA: Diagnosis present

## 2020-03-25 DIAGNOSIS — Y92009 Unspecified place in unspecified non-institutional (private) residence as the place of occurrence of the external cause: Secondary | ICD-10-CM

## 2020-03-25 DIAGNOSIS — Z66 Do not resuscitate: Secondary | ICD-10-CM | POA: Diagnosis present

## 2020-03-25 DIAGNOSIS — Z79890 Hormone replacement therapy (postmenopausal): Secondary | ICD-10-CM | POA: Diagnosis not present

## 2020-03-25 DIAGNOSIS — I130 Hypertensive heart and chronic kidney disease with heart failure and stage 1 through stage 4 chronic kidney disease, or unspecified chronic kidney disease: Secondary | ICD-10-CM | POA: Diagnosis present

## 2020-03-25 DIAGNOSIS — E1140 Type 2 diabetes mellitus with diabetic neuropathy, unspecified: Secondary | ICD-10-CM | POA: Diagnosis present

## 2020-03-25 DIAGNOSIS — R0602 Shortness of breath: Secondary | ICD-10-CM | POA: Diagnosis not present

## 2020-03-25 DIAGNOSIS — Z86711 Personal history of pulmonary embolism: Secondary | ICD-10-CM | POA: Diagnosis not present

## 2020-03-25 DIAGNOSIS — Y95 Nosocomial condition: Secondary | ICD-10-CM | POA: Diagnosis present

## 2020-03-25 DIAGNOSIS — J9601 Acute respiratory failure with hypoxia: Secondary | ICD-10-CM

## 2020-03-25 DIAGNOSIS — D72829 Elevated white blood cell count, unspecified: Secondary | ICD-10-CM

## 2020-03-25 DIAGNOSIS — I5041 Acute combined systolic (congestive) and diastolic (congestive) heart failure: Secondary | ICD-10-CM | POA: Diagnosis present

## 2020-03-25 DIAGNOSIS — Z7189 Other specified counseling: Secondary | ICD-10-CM | POA: Diagnosis not present

## 2020-03-25 DIAGNOSIS — C3490 Malignant neoplasm of unspecified part of unspecified bronchus or lung: Secondary | ICD-10-CM | POA: Diagnosis not present

## 2020-03-25 DIAGNOSIS — I429 Cardiomyopathy, unspecified: Secondary | ICD-10-CM | POA: Diagnosis not present

## 2020-03-25 DIAGNOSIS — Z7984 Long term (current) use of oral hypoglycemic drugs: Secondary | ICD-10-CM | POA: Diagnosis not present

## 2020-03-25 DIAGNOSIS — R6520 Severe sepsis without septic shock: Secondary | ICD-10-CM | POA: Diagnosis present

## 2020-03-25 DIAGNOSIS — E440 Moderate protein-calorie malnutrition: Secondary | ICD-10-CM | POA: Diagnosis present

## 2020-03-25 DIAGNOSIS — Z20822 Contact with and (suspected) exposure to covid-19: Secondary | ICD-10-CM | POA: Diagnosis present

## 2020-03-25 DIAGNOSIS — E1122 Type 2 diabetes mellitus with diabetic chronic kidney disease: Secondary | ICD-10-CM | POA: Diagnosis present

## 2020-03-25 DIAGNOSIS — J9621 Acute and chronic respiratory failure with hypoxia: Secondary | ICD-10-CM | POA: Diagnosis present

## 2020-03-25 LAB — COMPREHENSIVE METABOLIC PANEL
ALT: 19 U/L (ref 0–44)
AST: 15 U/L (ref 15–41)
Albumin: 2.3 g/dL — ABNORMAL LOW (ref 3.5–5.0)
Alkaline Phosphatase: 44 U/L (ref 38–126)
Anion gap: 7 (ref 5–15)
BUN: 14 mg/dL (ref 8–23)
CO2: 24 mmol/L (ref 22–32)
Calcium: 6.9 mg/dL — ABNORMAL LOW (ref 8.9–10.3)
Chloride: 108 mmol/L (ref 98–111)
Creatinine, Ser: 1.32 mg/dL — ABNORMAL HIGH (ref 0.61–1.24)
GFR, Estimated: 56 mL/min — ABNORMAL LOW (ref >60)
Glucose, Bld: 86 mg/dL (ref 70–99)
Potassium: 3 mmol/L — ABNORMAL LOW (ref 3.5–5.1)
Sodium: 139 mmol/L (ref 135–145)
Total Bilirubin: 0.7 mg/dL (ref 0.3–1.2)
Total Protein: 5.2 g/dL — ABNORMAL LOW (ref 6.5–8.1)

## 2020-03-25 LAB — PROTIME-INR
INR: 1.2 (ref 0.8–1.2)
Prothrombin Time: 15 seconds (ref 11.4–15.2)

## 2020-03-25 LAB — URINALYSIS, ROUTINE W REFLEX MICROSCOPIC
Bacteria, UA: NONE SEEN
Bilirubin Urine: NEGATIVE
Glucose, UA: NEGATIVE mg/dL
Ketones, ur: NEGATIVE mg/dL
Leukocytes,Ua: NEGATIVE
Nitrite: NEGATIVE
Protein, ur: NEGATIVE mg/dL
Specific Gravity, Urine: 1.012 (ref 1.005–1.030)
pH: 6 (ref 5.0–8.0)

## 2020-03-25 LAB — CBC
HCT: 34.9 % — ABNORMAL LOW (ref 39.0–52.0)
Hemoglobin: 11.4 g/dL — ABNORMAL LOW (ref 13.0–17.0)
MCH: 30.4 pg (ref 26.0–34.0)
MCHC: 32.7 g/dL (ref 30.0–36.0)
MCV: 93.1 fL (ref 80.0–100.0)
Platelets: 233 10*3/uL (ref 150–400)
RBC: 3.75 MIL/uL — ABNORMAL LOW (ref 4.22–5.81)
RDW: 16.6 % — ABNORMAL HIGH (ref 11.5–15.5)
WBC: 12.3 10*3/uL — ABNORMAL HIGH (ref 4.0–10.5)
nRBC: 0 % (ref 0.0–0.2)

## 2020-03-25 LAB — BLOOD GAS, ARTERIAL
Acid-Base Excess: 1.3 mmol/L (ref 0.0–2.0)
Bicarbonate: 26 mmol/L (ref 20.0–28.0)
FIO2: 100
O2 Saturation: 98.8 %
Patient temperature: 37
pCO2 arterial: 34.6 mmHg (ref 32.0–48.0)
pH, Arterial: 7.467 — ABNORMAL HIGH (ref 7.350–7.450)
pO2, Arterial: 155 mmHg — ABNORMAL HIGH (ref 83.0–108.0)

## 2020-03-25 LAB — ECHOCARDIOGRAM COMPLETE
Area-P 1/2: 5.29 cm2
Height: 64 in
S' Lateral: 3.97 cm
Weight: 4400 oz

## 2020-03-25 LAB — GLUCOSE, CAPILLARY: Glucose-Capillary: 96 mg/dL (ref 70–99)

## 2020-03-25 LAB — APTT: aPTT: 34 seconds (ref 24–36)

## 2020-03-25 LAB — MRSA PCR SCREENING: MRSA by PCR: NEGATIVE

## 2020-03-25 LAB — CREATININE, SERUM
Creatinine, Ser: 1.29 mg/dL — ABNORMAL HIGH (ref 0.61–1.24)
GFR, Estimated: 58 mL/min — ABNORMAL LOW (ref >60)

## 2020-03-25 LAB — MAGNESIUM: Magnesium: 1.7 mg/dL (ref 1.7–2.4)

## 2020-03-25 LAB — CBG MONITORING, ED
Glucose-Capillary: 73 mg/dL (ref 70–99)
Glucose-Capillary: 65 mg/dL — ABNORMAL LOW (ref 70–99)
Glucose-Capillary: 86 mg/dL (ref 70–99)

## 2020-03-25 LAB — CK: Total CK: 114 U/L (ref 49–397)

## 2020-03-25 LAB — PROCALCITONIN: Procalcitonin: 0.69 ng/mL

## 2020-03-25 LAB — BRAIN NATRIURETIC PEPTIDE: B Natriuretic Peptide: 454 pg/mL — ABNORMAL HIGH (ref 0.0–100.0)

## 2020-03-25 LAB — PHOSPHORUS: Phosphorus: 2.1 mg/dL — ABNORMAL LOW (ref 2.5–4.6)

## 2020-03-25 MED ORDER — LABETALOL HCL 5 MG/ML IV SOLN: 10.0000 mg | INTRAVENOUS | Status: DC | PRN

## 2020-03-25 MED ORDER — METOPROLOL SUCCINATE ER 25 MG PO TB24
25.0000 mg | ORAL_TABLET | Freq: Every evening | ORAL | Status: DC
  Administered 2020-03-26: 25 mg via ORAL
  Filled 2020-03-25: qty 1

## 2020-03-25 MED ORDER — GLUCERNA SHAKE PO LIQD
237.0000 mL | Freq: Three times a day (TID) | ORAL | Status: DC
  Administered 2020-03-25 – 2020-03-31 (×15): 237 mL via ORAL
  Filled 2020-03-25 (×8): qty 237

## 2020-03-25 MED ORDER — AMLODIPINE BESYLATE 5 MG PO TABS
2.5000 mg | ORAL_TABLET | Freq: Every day | ORAL | Status: DC
  Administered 2020-03-25 – 2020-03-27 (×3): 2.5 mg via ORAL
  Filled 2020-03-25 (×3): qty 1

## 2020-03-25 MED ORDER — CHLORHEXIDINE GLUCONATE CLOTH 2 % EX PADS
6.0000 | MEDICATED_PAD | Freq: Every day | CUTANEOUS | Status: DC
  Administered 2020-03-25 – 2020-03-31 (×6): 6 via TOPICAL

## 2020-03-25 MED ORDER — ALPRAZOLAM 1 MG PO TABS: 1.0000 mg | ORAL_TABLET | Freq: Two times a day (BID) | ORAL | Status: DC | PRN

## 2020-03-25 MED ORDER — POTASSIUM CHLORIDE 10 MEQ/100ML IV SOLN
10.0000 meq | INTRAVENOUS | Status: AC
  Administered 2020-03-25 (×4): 10 meq via INTRAVENOUS
  Filled 2020-03-25 (×4): qty 100

## 2020-03-25 MED ORDER — LEVALBUTEROL HCL 0.63 MG/3ML IN NEBU: 0.6300 mg | INHALATION_SOLUTION | Freq: Four times a day (QID) | RESPIRATORY_TRACT | Status: DC | PRN

## 2020-03-25 MED ORDER — PREDNISONE 20 MG PO TABS
50.0000 mg | ORAL_TABLET | Freq: Every day | ORAL | Status: DC
  Administered 2020-03-26 – 2020-03-31 (×6): 50 mg via ORAL
  Filled 2020-03-25: qty 1
  Filled 2020-03-25 (×2): qty 3
  Filled 2020-03-25: qty 1
  Filled 2020-03-25 (×2): qty 3

## 2020-03-25 MED ORDER — POTASSIUM CHLORIDE CRYS ER 20 MEQ PO TBCR
40.0000 meq | EXTENDED_RELEASE_TABLET | Freq: Once | ORAL | Status: AC
  Administered 2020-03-25: 40 meq via ORAL
  Filled 2020-03-25: qty 2

## 2020-03-25 MED ORDER — INSULIN ASPART 100 UNIT/ML ~~LOC~~ SOLN
0.0000 [IU] | Freq: Every day | SUBCUTANEOUS | Status: DC
  Administered 2020-03-26 – 2020-03-30 (×2): 2 [IU] via SUBCUTANEOUS

## 2020-03-25 MED ORDER — FUROSEMIDE 10 MG/ML IJ SOLN
40.0000 mg | Freq: Two times a day (BID) | INTRAMUSCULAR | Status: DC
  Administered 2020-03-25 – 2020-03-26 (×2): 40 mg via INTRAVENOUS
  Filled 2020-03-25 (×2): qty 4

## 2020-03-25 MED ORDER — INSULIN ASPART 100 UNIT/ML ~~LOC~~ SOLN
0.0000 [IU] | Freq: Three times a day (TID) | SUBCUTANEOUS | Status: DC
  Administered 2020-03-26: 5 [IU] via SUBCUTANEOUS
  Administered 2020-03-26: 2 [IU] via SUBCUTANEOUS
  Administered 2020-03-27: 5 [IU] via SUBCUTANEOUS
  Administered 2020-03-27: 2 [IU] via SUBCUTANEOUS
  Administered 2020-03-28: 5 [IU] via SUBCUTANEOUS
  Administered 2020-03-29: 1 [IU] via SUBCUTANEOUS
  Administered 2020-03-29: 5 [IU] via SUBCUTANEOUS
  Administered 2020-03-30 (×2): 2 [IU] via SUBCUTANEOUS
  Administered 2020-03-31: 1 [IU] via SUBCUTANEOUS

## 2020-03-25 MED ORDER — HEPARIN SODIUM (PORCINE) 5000 UNIT/ML IJ SOLN
5000.0000 [IU] | Freq: Three times a day (TID) | INTRAMUSCULAR | Status: DC
  Administered 2020-03-25 – 2020-03-31 (×18): 5000 [IU] via SUBCUTANEOUS
  Filled 2020-03-25 (×19): qty 1

## 2020-03-25 MED ORDER — IOHEXOL 350 MG/ML SOLN
100.0000 mL | Freq: Once | INTRAVENOUS | Status: AC | PRN
  Administered 2020-03-25: 100 mL via INTRAVENOUS

## 2020-03-25 MED ORDER — HALOPERIDOL LACTATE 5 MG/ML IJ SOLN: 1.0000 mg | Freq: Four times a day (QID) | INTRAMUSCULAR | Status: DC | PRN

## 2020-03-25 MED ORDER — SIMVASTATIN 20 MG PO TABS
20.0000 mg | ORAL_TABLET | Freq: Every evening | ORAL | Status: DC
  Administered 2020-03-26 – 2020-03-30 (×5): 20 mg via ORAL
  Filled 2020-03-25 (×5): qty 1

## 2020-03-25 NOTE — H&P (Signed)
History and Physical  Darrell Christensen KZS:[id number] DOB: 1945/06/13 DOA: 03/24/2020  Referring physician: Aris Georgia, MD PCP: Celene Squibb, MD  Patient coming from: Home  Chief Complaint: Fall and generalized weakness  HPI: Darrell Christensen is a 74 y.o. male with medical history significant for stage IV lung adenocarcinoma, followed by Dr. Delton Coombes with known brain mets, CKD, hypertension, hyperlipidemia, diabetes mellitus, hypertension who presents to the emergency department due to a fall sustained at home and he was too weak to get up.  Patient was unable to provide history of why he came to the ED, his only complaint was bilateral leg swelling.  History was obtained from ED physician and from ED medical record.  Per report, Family member checked on him and found him on the floor (he lives by himself), so he was brought to the ED with concern the patient is unable to live by himself and that he might need a facility care.  1 tablet of Xanax (dose unknown) was given prior to bringing the patient to the ED.  There was no complaint of fever, chills, chest pain, nausea vomiting abdominal pain  ED Course:  In the emergency department, BP was 91/55, he was tachypneic and O2 sats was reported to be below 90% on room air.  ABG showed hypoxia.  IV hydration per sepsis protocol was provided due to meeting SIRS criteria and supplemental oxygen via [place] at 4 LPM was provided.  Work-up in the ED showed leukocytosis, hypokalemia, hypoalbuminemia, BUN to creatinine 17/1.55 (baseline creatinine 1.5-1.8).  CT angiography of chest with contrast showed no evidence of pulmonary embolism but showed marked severity bilateral multifocal infiltrates and diffuse interstitial thickening with superimposed pulmonary edema.  CT of head without contrast showed no acute intracranial abnormality but showed generalized cerebral atrophy and a known small right parietal lobe metastatic lesion.  Chest x-ray showed bibasilar atelectasis or  infiltrate.  Patient was empirically started on IV antibiotics (cefepime, metronidazole and vancomycin)  Review of Systems:  This cannot be obtained at this time due to patient's current state  Past Medical History:  Diagnosis Date  . Adrenal mass, left ([place]) 04/01/2016  . Diabetes mellitus ([place]) 05/02/2016  . Diabetes mellitus without complication ([place])   . Hypertension   . Mass of breast, right 04/01/2016  . Mass of upper lobe of right lung 04/01/2016   2 adjacent, 5 cm masses, hilar adenopathy  . Primary cancer of right upper lobe of lung ([place]) 04/01/2016   2 adjacent, 5 cm masses, hilar adenopathy  . Pulmonary embolus ([place]) 03/31/2016  . 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 COLONOSCOPY N/A 07/28/2017   Procedure: COLONOSCOPY;  Surgeon: Rogene Houston, MD;  Location: [facility] ENDO SUITE;  Service: Endoscopy;  Laterality: N/A;  205  . PORTACATH PLACEMENT Left 05/13/2016   Procedure: INSERTION PORT-A-CATH;  Surgeon: Aviva Signs, MD;  Location: [facility] ORS;  Service: General;  Laterality: Left;    Social History:  reports that he quit smoking about 5 years ago. He quit after 61.00 years of use. He has never used smokeless tobacco. He reports previous alcohol use. He reports that he does not use drugs.   No Known Allergies  Family History  Problem Relation Age of Onset  . Stroke Mother   . Heart attack Father   . Heart attack Brother      Prior to Admission medications   Medication Sig Start Date End Date Taking?  Authorizing Provider  ALPRAZolam [name]) 1 MG tablet Take 1 mg by mouth 2 (two) times daily as needed for anxiety.   Yes [provider]  amLODipine (NORVASC) 2.5 MG tablet Take 2.5 mg by mouth daily. 03/22/16  Yes [provider]  diphenoxylate-atropine (LOMOTIL) 2.5-0.025 MG tablet Take 2 tablets 4 times per day. 03/08/20  Yes Derek Jack, MD  DULoxetine (CYMBALTA) 60 MG capsule Take 1 capsule (60 mg  total) by mouth daily. 01/26/20  Yes Derek Jack, MD  famotidine (PEPCID) 20 MG tablet Take 1 tablet (20 mg total) by mouth daily. 01/13/20  Yes Derek Jack, MD  folic acid (FOLVITE) 1 MG tablet Take 1 tablet (1 mg total) by mouth daily. 03/18/20  Yes Johnson, Clanford L, MD  GLIPIZIDE XL 5 MG 24 hr tablet Take 5 mg by mouth daily.  03/22/16  Yes [provider]  hydrocortisone 2.5 % lotion Apply topically as needed.  02/13/19  Yes [provider]  lidocaine-prilocaine (EMLA) cream APPLY A QUARTER SIZE AMOUNT TO THE AFFECTED AREA 1 HOUR PRIOR TO COMING TO CHEMOTHERAPY. COVER WITH A PLASTIC WRAP. 01/07/19  Yes Lockamy, Randi L, NP-C  magnesium oxide (MAG-OX) 400 (241.3 Mg) MG tablet Take 1 tablet (400 mg total) by mouth 2 (two) times daily. 04/14/19  Yes Derek Jack, MD  metFORMIN (GLUCOPHAGE) 500 MG tablet Take 500 mg by mouth 2 (two) times daily. 03/07/16  Yes [provider]  metoprolol succinate (TOPROL-XL) 25 MG 24 hr tablet TAKE ONE TABLET BY MOUTH IN THE EVENING. 03/26/17  Yes Holley Bouche, NP  oxyCODONE-acetaminophen (PERCOCET) 10-325 MG tablet Take 1 tablet by mouth as needed.  06/18/17  Yes [provider]  Pembrolizumab (KEYTRUDA IV) Inject into the vein. 12/16/18  Yes [provider]  potassium chloride (K-DUR,KLOR-CON) 10 MEQ tablet Take 10 mEq by mouth daily.  01/16/18  Yes [provider]  predniSONE (DELTASONE) 20 MG tablet Take 2.5 tablets (50 mg total) by mouth daily with breakfast. 01/20/20  Yes Derek Jack, MD  prochlorperazine (COMPAZINE) 10 MG tablet Take 1 tablet (10 mg total) by mouth every 6 (six) hours as needed (Nausea or vomiting). 10/15/18  Yes Derek Jack, MD  simvastatin (ZOCOR) 20 MG tablet Take 20 mg by mouth every evening. 03/07/16  Yes [provider]  torsemide (DEMADEX) 20 MG tablet Take 1 tablet (20 mg total) by mouth daily. 01/27/19  Yes Derek Jack, MD   lactulose (CHRONULAC) 10 GM/15ML solution Take 30 mLs (20 g total) by mouth at bedtime as needed for mild constipation. 08/06/19   Derek Jack, MD  ondansetron (ZOFRAN) 4 MG tablet Take 1 tablet (4 mg total) by mouth every 8 (eight) hours as needed for nausea or vomiting. 11/03/18   Kem Parkinson, PA-C    Physical Exam: BP (!) 136/94   Pulse 74   Temp 97.9 F (36.6 C) (Oral)   Resp (!) 33   Ht 5\' 4"  (1.626 m)   Wt 124.7 kg   SpO2 94%   BMI 47.20 kg/m   . General: 74 y.o. year-old male well developed well nourished in no acute distress.  Alert and oriented x3. Marland Kitchen HEENT: NCAT, EOMI . Neck: Supple, trachea medial . Cardiovascular: Regular rate and rhythm with no rubs or gallops.  No thyromegaly or JVD noted.  2/4 pulses in all 4 extremities. Marland Kitchen Respiratory: Diffuse coarse breath sounds on auscultation.  No increased work of breathing. . Abdomen: Soft nontender nondistended with normal bowel sounds x4 quadrants. Marland Kitchen  Muskuloskeletal: No cyanosis, clubbing or edema noted bilaterally . Neuro: CN II-XII intact, strength, sensation, reflexes . Skin: No ulcerative lesions noted or rashes . Psychiatry: Judgement and insight appear normal. Mood is appropriate for condition and setting          Labs on Admission:  Basic Metabolic Panel: Recent Labs  Lab 03/18/20 0510 03/21/20 1023 03/24/20 1857  NA 137 137 137  K 3.5 3.0* 2.8*  CL 102 101 101  CO2 26 25 27   GLUCOSE 93 121* 98  BUN 20 9 17   CREATININE 1.46* 1.51* 1.55*  CALCIUM 8.1* 8.4* 7.7*  MG  --  1.5*  --    Liver Function Tests: Recent Labs  Lab 03/21/20 1023 03/24/20 1857  AST 16 18  ALT 22 22  ALKPHOS 50 50  BILITOT 0.8 0.9  PROT 6.2* 5.7*  ALBUMIN 3.0* 2.7*   No results for input(s): LIPASE, AMYLASE in the last 168 hours. No results for input(s): AMMONIA in the last 168 hours. CBC: Recent Labs  Lab 03/18/20 0510 03/21/20 1023 03/24/20 1857  WBC 10.4 10.2 15.8*  NEUTROABS  --  5.3 11.0*  HGB 11.9*  13.1 11.7*  HCT 36.2* 39.9 35.7*  MCV 92.3 91.5 91.3  PLT 241 277 238   Cardiac Enzymes: No results for input(s): CKTOTAL, CKMB, CKMBINDEX, TROPONINI in the last 168 hours.  BNP (last 3 results) Recent Labs    03/17/20 1645  BNP 131.0*    ProBNP (last 3 results) No results for input(s): PROBNP in the last 8760 hours.  CBG: Recent Labs  Lab 03/18/20 0934  GLUCAP 102*    Radiological Exams on Admission: CT Head Wo Contrast  Result Date: 03/25/2020 CLINICAL DATA:  Altered mental status. EXAM: CT HEAD WITHOUT CONTRAST TECHNIQUE: Contiguous axial images were obtained from the base of the skull through the vertex without intravenous contrast. COMPARISON:  March 17, 2020 FINDINGS: Brain: There is mild cerebral atrophy with widening of the extra-axial spaces and ventricular dilatation. There are areas of decreased attenuation within the white matter tracts of the supratentorial brain, consistent with microvascular disease changes. Stable focal white matter low attenuation is noted within the posterior aspect of the right parietal lobe. Vascular: No hyperdense vessel or unexpected calcification. Skull: Normal. Negative for fracture or focal lesion. Sinuses/Orbits: No acute finding. Other: None. IMPRESSION: 1. Generalized cerebral atrophy. 2. No acute intracranial abnormality. 3. Stable findings consistent with a known small right parietal lobe metastatic lesion. Electronically Signed   By: Virgina Norfolk M.D.   On: 03/25/2020 01:16   CT Angio Chest PE W/Cm &/Or Wo Cm  Result Date: 03/25/2020 CLINICAL DATA:  Shortness of breath. EXAM: CT ANGIOGRAPHY CHEST WITH CONTRAST TECHNIQUE: Multidetector CT imaging of the chest was performed using the standard protocol during bolus administration of intravenous contrast. Multiplanar CT image reconstructions and MIPs were obtained to evaluate the vascular anatomy. CONTRAST:  18mL OMNIPAQUE IOHEXOL 350 MG/ML SOLN COMPARISON:  March 07, 2020  FINDINGS: Cardiovascular: There is moderate to marked severity calcification of the aortic arch. Satisfactory opacification of the pulmonary arteries to the segmental level. No evidence of pulmonary embolism. Normal heart size with moderate to marked severity coronary artery calcification. No pericardial effusion. Mediastinum/Nodes: Mild paratracheal and AP window lymphadenopathy is again seen. Small colloid cysts are again seen within the right lobe of the thyroid gland. The trachea and esophagus demonstrate no significant findings. Lungs/Pleura: Mild-to-moderate severity diffuse interstitial thickening is noted bilaterally. This is increased in severity when compared to  the prior study. Marked severity multifocal infiltrates are also seen throughout both lungs. This represents a new finding when compared to the prior exam. A 4.1 cm x 1.6 cm posteromedial right upper lobe lung mass is seen. This measures approximately 3.3 cm x 1.4 cm on the prior study. There is no evidence of a pleural effusion or pneumothorax. Upper Abdomen: No acute abnormality. Musculoskeletal: Degenerative changes are noted throughout the thoracic spine. Review of the MIP images confirms the above findings. IMPRESSION: 1. No evidence of pulmonary embolism. 2. Marked severity bilateral multifocal infiltrates. 3. Diffuse interstitial thickening the which may represent sequelae associated with superimposed pulmonary edema. 4. Posteromedial right upper lobe lung mass which is increased in size when compared to the prior exam. This may be, in part, secondary to surrounding areas of adjacent airspace disease. Correlation with follow-up chest plain film is recommended. 5. Moderate to marked severity coronary artery disease. 6. Aortic atherosclerosis. Aortic Atherosclerosis (ICD10-I70.0). Electronically Signed   By: Virgina Norfolk M.D.   On: 03/25/2020 01:29   DG Chest Port 1 View  Result Date: 03/24/2020 CLINICAL DATA:  Suspected sepsis.   Metastatic lung cancer. EXAM: PORTABLE CHEST 1 VIEW COMPARISON:  03/17/2020 FINDINGS: Left Port-A-Cath remains in place, unchanged. Cardiomegaly. Low lung volumes. Right upper lobe nodule/mass seen on prior CT not appreciable on plain film. Chronic interstitial changes. Bibasilar airspace opacities. No effusions or acute bony abnormality. IMPRESSION: Low lung volumes.  Bibasilar atelectasis or infiltrates. Cardiomegaly. Chronic interstitial lung disease. Electronically Signed   By: Rolm Baptise M.D.   On: 03/24/2020 20:18    EKG: I independently viewed the EKG done and my findings are as followed: Sinus rhythm at rate of 81 bpm  Assessment/Plan Present on Admission: . Acute respiratory failure with hypoxia ([place]) . Primary cancer of right upper lobe of lung ([place]) . HTN (hypertension), benign . Brain metastasis ([place]) . Acute encephalopathy  Active Problems:   Primary cancer of right upper lobe of lung (HCC)   HTN (hypertension), benign   Diabetes mellitus ([place])   Brain metastasis ([place])   Acute encephalopathy   Acute respiratory failure with hypoxia (HCC)   Generalized weakness   Fall at home, initial encounter   Leukocytosis   Hypokalemia   Hypoalbuminemia   Pulmonary edema   Healthcare-associated pneumonia   Diabetic neuropathy (HCC)   Acute respiratory failure with hypoxia possibly secondary to multifactorial including presumed HCAP/aspiration pneumonia with superimposed pulmonary edema R/O CHF Patient was tachypneic and WBC was elevated at 15.8 on arrival (meets SIRS criteria), due to patient's soft BP on arrival, he was deemed to have sepsis and was started on IV hydration per sepsis protocol.  Lactic acid was negative Patient was started on IV antibiotics (vancomycin, metronidazole and cefepime), we shall continue with same at this time with plan to de-escalate based on procalcitonin, MRSA screen, blood culture and sputum culture Continue Mucinex, incentive spirometry, flutter  valve  BNP checked was 454, this was 131 (8 days ago) IV hydration due to presumed sepsis was stopped  Continue total input/output, daily weights and fluid restriction Continue Cardiac diet  Echocardiogram in the morning   Leukocytosis possibly secondary to prednisone effect WBC 15.8, continue management as per above and continue to monitor WBC with morning labs  Acute metabolic encephalopathy/deconditioning/fall Patient was noted to have had prior falls in the past Total CK will be checked to ensure no rhabdomyolysis Continue fall precaution and neurochecks Continue PT/OT eval and treat Xanax, Cymbalta, Neurontin, Compazine will be  held at this time  Hypokalemia K+ 2.8; this was replenished  Hypoalbuminemia probably secondary to moderate protein calorie malnutrition Albumin 2.7, protein supplement will be provided  Stage IV adenocarcinoma with brain metastasis Patient follows with Dr. Delton Coombes CT of head without contrast shows stable findings consistent with the known small right parietal lobe metastatic lesion  CKD stage III BUN to creatinine 17/1.55 (baseline creatinine 1.5-1.8) Renally adjust medications, avoid nephrotoxic agents/dehydration/hypotension  Hypertension BP meds will be held at this time due to soft BP  Peripheral neuropathy Duloxetine will be resumed once patient's mental status has improved  Diabetes mellitus Last A1c done on 11/12 showed hemoglobin A1c of 6.8 Continue insulin sliding scale and hypoglycemic protocol  Social issues Patient lives alone and considering being generally weak and having recurrent falls, family believes patient will be unable to continue to live by himself and think of patient going to a facility per ED medical record.   DVT prophylaxis: Heparin subcu  Code Status: Full code  Family Communication: None at bedside  Disposition Plan:  Patient is from:                        home Anticipated DC to:                    SNF or family members home Anticipated DC date:               2-3 days Anticipated DC barriers:           Patient is unstable to be discharged at this time due to acute respiratory failure requiring supplemental oxygen, presumed pneumonia POA with need to de-escalate based on pending labs and pending PT/OT eval and treat.   Consults called: None  Admission status: Inpatient    Bernadette Hoit MD [facility]  03/25/2020, 5:07 AM

## 2020-03-25 NOTE — ED Provider Notes (Signed)
Patient left at change of shift to get the results of patient's CT tests.  Patient lives home alone and is being treated for stage IV lung cancer with known mets to the brain.  He is getting chemotherapy.  After patient fell today he was too weak to get up.  Family is interested in having him placed in a assisted living facility.  When patient arrived he was also hypoxic.  He is not on home oxygen.  He also had some low blood pressures that have improved with IV fluids.  He was initially thought to be a code sepsis and was also started on IV antibiotics.  2:43 AM Dr. Josephine Cables, hospitalist will see patient for admission.  CT Head Wo Contrast  Result Date: 03/25/2020 CLINICAL DATA:  Altered mental status. EXAM: CT HEAD WITHOUT CONTRAST TECHNIQUE: Contiguous axial images were obtained from the base of the skull through the vertex without intravenous contrast. COMPARISON:  March 17, 2020 FINDINGS: Brain: There is mild cerebral atrophy with widening of the extra-axial spaces and ventricular dilatation. There are areas of decreased attenuation within the white matter tracts of the supratentorial brain, consistent with microvascular disease changes. Stable focal white matter low attenuation is noted within the posterior aspect of the right parietal lobe. Vascular: No hyperdense vessel or unexpected calcification. Skull: Normal. Negative for fracture or focal lesion. Sinuses/Orbits: No acute finding. Other: None. IMPRESSION: 1. Generalized cerebral atrophy. 2. No acute intracranial abnormality. 3. Stable findings consistent with a known small right parietal lobe metastatic lesion. Electronically Signed   By: Virgina Norfolk M.D.   On: 03/25/2020 01:16      CT Angio Chest PE W/Cm &/Or Wo Cm  Result Date: 03/25/2020 CLINICAL DATA:  Shortness of breath. EXAM: CT ANGIOGRAPHY CHEST WITH CONTRAST TECHNIQUE: Multidetector CT imaging of the chest was performed using the standard protocol during bolus  administration of intravenous contrast. Multiplanar CT image reconstructions and MIPs were obtained to evaluate the vascular anatomy. CONTRAST:  135mL OMNIPAQUE IOHEXOL 350 MG/ML SOLN COMPARISON:  March 07, 2020 FINDINGS: Cardiovascular: There is moderate to marked severity calcification of the aortic arch. Satisfactory opacification of the pulmonary arteries to the segmental level. No evidence of pulmonary embolism. Normal heart size with moderate to marked severity coronary artery calcification. No pericardial effusion. Mediastinum/Nodes: Mild paratracheal and AP window lymphadenopathy is again seen. Small colloid cysts are again seen within the right lobe of the thyroid gland. The trachea and esophagus demonstrate no significant findings. Lungs/Pleura: Mild-to-moderate severity diffuse interstitial thickening is noted bilaterally. This is increased in severity when compared to the prior study. Marked severity multifocal infiltrates are also seen throughout both lungs. This represents a new finding when compared to the prior exam. A 4.1 cm x 1.6 cm posteromedial right upper lobe lung mass is seen. This measures approximately 3.3 cm x 1.4 cm on the prior study. There is no evidence of a pleural effusion or pneumothorax. Upper Abdomen: No acute abnormality. Musculoskeletal: Degenerative changes are noted throughout the thoracic spine. Review of the MIP images confirms the above findings. IMPRESSION: 1. No evidence of pulmonary embolism. 2. Marked severity bilateral multifocal infiltrates. 3. Diffuse interstitial thickening the which may represent sequelae associated with superimposed pulmonary edema. 4. Posteromedial right upper lobe lung mass which is increased in size when compared to the prior exam. This may be, in part, secondary to surrounding areas of adjacent airspace disease. Correlation with follow-up chest plain film is recommended. 5. Moderate to marked severity coronary artery disease. 6. Aortic  atherosclerosis. Aortic Atherosclerosis (ICD10-I70.0). Electronically Signed   By: Virgina Norfolk M.D.   On: 03/25/2020 01:29   Diagnoses that have been ruled out:  None  Diagnoses that are still under consideration:  None  Final diagnoses:  Altered mental status, unspecified altered mental status type  Hypoxia  SIRS (systemic inflammatory response syndrome) (HCC)  Hypokalemia  Fall, initial encounter  Lung cancer metastatic to brain [facility])  Community acquired pneumonia, unspecified laterality  Hypotension, unspecified hypotension type   Plan admission  Rolland Porter, MD, Barbette Or, MD 03/25/20 [id number]

## 2020-03-25 NOTE — ED Notes (Addendum)
Pt oxygen ranging from 82-84 on 4L. Pt is sleeping and breathing through mouth. Encouraged pt to try and breath through his nose. Dr Tomi Bamberger and Josephine Cables have been notified.

## 2020-03-25 NOTE — Progress Notes (Signed)
*  PRELIMINARY RESULTS* Echocardiogram 2D Echocardiogram has been performed.  Leavy Cella 03/25/2020, 10:39 AM

## 2020-03-25 NOTE — ED Notes (Signed)
Pt has scattered open areas of skin breakdown noted to buttocks

## 2020-03-25 NOTE — ED Notes (Signed)
RT at bedside to evaluate pt

## 2020-03-25 NOTE — Progress Notes (Addendum)
Per HPI: Darrell Christensen is a 74 y.o. male with medical history significant for stage IV lung adenocarcinoma, followed by Dr. Delton Coombes with known brain mets, CKD, hypertension, hyperlipidemia, diabetes mellitus, hypertension who presents to the emergency department due to a fall sustained at home and he was too weak to get up.  Patient was unable to provide history of why he came to the ED, his only complaint was bilateral leg swelling.  History was obtained from ED physician and from ED medical record.  Per report, Family member checked on him and found him on the floor (he lives by himself), so he was brought to the ED with concern the patient is unable to live by himself and that he might need a facility care.  1 tablet of Xanax (dose unknown) was given prior to bringing the patient to the ED.  There was no complaint of fever, chills, chest pain, nausea vomiting abdominal pain.  -Patient was admitted with acute hypoxemic respiratory failure in the setting of possible HCAP/aspiration pneumonia with associated sepsis on admission as well as concern for pulmonary edema with possible congestive heart failure.  He is noted to be on chronic prednisone which may contribute to the leukocytosis.  His primary concern on admission was lower extremity edema and weakness.  He was recently discharged from the hospital on 11/13 and was admitted for AKI/dehydration at that time and received IV fluids.  His 2D echocardiogram on 11/20 demonstrates a significant reduction in LV function with global hypokinesis and LVEF of 30-35%.  Blood pressures are elevated and he is on nonrebreather mask with 15 L nasal cannula oxygen.  Plan will be to resume home antihypertensive medications and use Lasix for diuresis as lactic acid levels are not elevated.  Discontinue vancomycin and Flagyl and continue only on cefepime at the moment.  Patient does state that he is DNR.  He will need close cardiology follow-up by 11/22 or sooner if there is  concern for an acute process.  Plan is for placement to SNF on discharge. Discussed with granddaughter on phone 11/20.  Acute hypoxemic respiratory failure-multifactorial -In setting of HCAP and more likely acute systolic and diastolic CHF exacerbation -Continue on cefepime empirically -Plan for diuresis as noted below -Wean O2 as tolerated  Acute systolic and diastolic CHF exacerbation -IV Lasix for diuresis -Resume home metoprolol -Monitor daily weights -Strict I's and O's -Monitor potassium levels closely  Hypokalemia -Likely related to diarrhea ongoing -Replete IV and oral -Recheck with mag in a.m. -Monitor carefully with Lasix  Stage IV lung adenocarcinoma with brain metastasis -Follows with Dr. Delton Coombes outpatient, chemo recently held due to diarrhea -Patient is DNR  CKD stage III -Current creatinine 1.3 with baseline near 1.5-1.8 -Monitor closely with Lasix use  Hypertension-elevated -Continue blood pressure agents from home as ordered -Monitor with aggressive IV diuresis, holding home torsemide -IV labetalol as needed  Peripheral neuropathy -Plan to resume home duloxetine  Type 2 diabetes -Without any significant hyperglycemia currently -Some noted hypoglycemia, maintain on sliding scale insulin for now  Moderate protein calorie malnutrition  Total care time: 45 minutes.

## 2020-03-26 DIAGNOSIS — J9601 Acute respiratory failure with hypoxia: Secondary | ICD-10-CM | POA: Diagnosis not present

## 2020-03-26 LAB — BASIC METABOLIC PANEL
Anion gap: 9 (ref 5–15)
BUN: 16 mg/dL (ref 8–23)
CO2: 27 mmol/L (ref 22–32)
Calcium: 7.3 mg/dL — ABNORMAL LOW (ref 8.9–10.3)
Chloride: 98 mmol/L (ref 98–111)
Creatinine, Ser: 1.39 mg/dL — ABNORMAL HIGH (ref 0.61–1.24)
GFR, Estimated: 53 mL/min — ABNORMAL LOW (ref >60)
Glucose, Bld: 192 mg/dL — ABNORMAL HIGH (ref 70–99)
Potassium: 3 mmol/L — ABNORMAL LOW (ref 3.5–5.1)
Sodium: 134 mmol/L — ABNORMAL LOW (ref 135–145)

## 2020-03-26 LAB — GLUCOSE, CAPILLARY
Glucose-Capillary: 76 mg/dL (ref 70–99)
Glucose-Capillary: 183 mg/dL — ABNORMAL HIGH (ref 70–99)
Glucose-Capillary: 270 mg/dL — ABNORMAL HIGH (ref 70–99)
Glucose-Capillary: 243 mg/dL — ABNORMAL HIGH (ref 70–99)

## 2020-03-26 LAB — CBC
HCT: 34.3 % — ABNORMAL LOW (ref 39.0–52.0)
Hemoglobin: 11.4 g/dL — ABNORMAL LOW (ref 13.0–17.0)
MCH: 30.2 pg (ref 26.0–34.0)
MCHC: 33.2 g/dL (ref 30.0–36.0)
MCV: 91 fL (ref 80.0–100.0)
Platelets: 218 10*3/uL (ref 150–400)
RBC: 3.77 MIL/uL — ABNORMAL LOW (ref 4.22–5.81)
RDW: 15.9 % — ABNORMAL HIGH (ref 11.5–15.5)
WBC: 11.5 10*3/uL — ABNORMAL HIGH (ref 4.0–10.5)
nRBC: 0 % (ref 0.0–0.2)

## 2020-03-26 LAB — TSH: TSH: 1.274 u[IU]/mL (ref 0.350–4.500)

## 2020-03-26 LAB — MAGNESIUM: Magnesium: 1.6 mg/dL — ABNORMAL LOW (ref 1.7–2.4)

## 2020-03-26 LAB — AMMONIA: Ammonia: 10 umol/L (ref 9–35)

## 2020-03-26 MED ORDER — POTASSIUM CHLORIDE 10 MEQ/100ML IV SOLN
10.0000 meq | INTRAVENOUS | Status: AC
  Administered 2020-03-26 (×4): 10 meq via INTRAVENOUS
  Filled 2020-03-26 (×4): qty 100

## 2020-03-26 MED ORDER — MAGNESIUM SULFATE 2 GM/50ML IV SOLN
2.0000 g | Freq: Once | INTRAVENOUS | Status: AC
  Administered 2020-03-26: 2 g via INTRAVENOUS
  Filled 2020-03-26: qty 50

## 2020-03-26 MED ORDER — POTASSIUM CHLORIDE CRYS ER 20 MEQ PO TBCR
40.0000 meq | EXTENDED_RELEASE_TABLET | Freq: Once | ORAL | Status: AC
  Administered 2020-03-26: 40 meq via ORAL
  Filled 2020-03-26: qty 2

## 2020-03-26 NOTE — Progress Notes (Signed)
PROGRESS NOTE    Darrell Christensen  QVZ:[id number] DOB: 04-16-1946 DOA: 03/24/2020 PCP: Celene Squibb, MD   Brief Narrative:  Per HPI: Darrell Christensen a 74 y.o.malewith medical history significant forstage IV lung adenocarcinoma, followed by Dr. Delton Coombes with known brain mets, CKD, hypertension, hyperlipidemia, diabetes mellitus, hypertensionwho presents to the emergency department due to a fall sustained at home and he was too weak to get up. Patient was unable to provide history of why he came to the ED, his only complaint was bilateral leg swelling. History was obtained from ED physician and from ED medical record. Per report, Family member checked on him and found him on the floor (he lives by himself), so he was brought to the ED with concern the patient is unable to live by himself and that he might need a facility care. 1 tablet of Xanax (dose unknown) was given prior to bringing the patient to the ED. There was no complaint of fever, chills, chest pain, nausea vomiting abdominal pain.  -Patient was admitted with acute hypoxemic respiratory failure in the setting of possible HCAP/aspiration pneumonia with associated sepsis on admission as well as concern for pulmonary edema with possible congestive heart failure.  He is noted to be on chronic prednisone which may contribute to the leukocytosis.  His primary concern on admission was lower extremity edema and weakness.  He was recently discharged from the hospital on 11/13 and was admitted for AKI/dehydration at that time and received IV fluids. His 2D echocardiogram on 11/20 demonstrates a significant reduction in LV function with global hypokinesis and LVEF of 30-35%. Patient does state that he is DNR.  He will need close cardiology follow-up by 11/22 or sooner if there is concern for an acute process.  Plan is for placement to SNF on discharge.  Assessment & Plan:   Active Problems:   Primary cancer of right upper lobe of lung  (HCC)   HTN (hypertension), benign   Diabetes mellitus ([place])   Brain metastasis ([place])   Acute encephalopathy   Acute respiratory failure with hypoxia (HCC)   Generalized weakness   Fall at home, initial encounter   Leukocytosis   Hypokalemia   Hypoalbuminemia   Pulmonary edema   Healthcare-associated pneumonia   Diabetic neuropathy (HCC)   Acute hypoxemic respiratory failure-multifactorial; improving -Currently on 4L [place] and without respiratory distress -In setting of HCAP and more likely acute systolic and diastolic CHF exacerbation -Continue on cefepime empirically; blood cultures x 2 negative -Wean O2 as tolerated  Acute systolic and diastolic CHF exacerbation-improving -IV Lasix for diuresis given; now held until labs can be obtained -Resume home metoprolol -Monitor daily weights -Strict I's and O's -Monitor potassium levels closely  Acute encephalopathy -Further labs pending -CT head with no acute findings and stable atrophy and mets -No hypercapnia noted  Hypokalemia -Repeat labs pending -Likely related to diarrhea ongoing -Replete IV and oral -Recheck with mag in a.m. -Monitor carefully with Lasix  Stage IV lung adenocarcinoma with brain metastasis -Follows with Dr. Delton Coombes outpatient, chemo recently held due to diarrhea -Patient is DNR  CKD stage III -Current creatinine 1.3 with baseline near 1.5-1.8 -Monitor closely with Lasix use  Hypertension-elevated -Continue blood pressure agents from home as ordered -Monitor with aggressive IV diuresis, holding home torsemide -IV labetalol as needed  Peripheral neuropathy -Plan to resume home duloxetine  Type 2 diabetes -Without any significant hyperglycemia currently -Some noted hypoglycemia, maintain on sliding scale insulin for now  Moderate protein calorie malnutrition  DVT prophylaxis:Heparin Code Status: DNR Family Communication: Discussed with daughter on phone 11/21 Disposition Plan:   Status is: Inpatient  Remains inpatient appropriate because:IV treatments appropriate due to intensity of illness or inability to take PO and Inpatient level of care appropriate due to severity of illness   Dispo: The patient is from: Home              Anticipated d/c is to: SNF              Anticipated d/c date is: 3 days              Patient currently is not medically stable to d/c.  Consultants:   Cardiology in AM  Procedures:   See below  Antimicrobials:  Anti-infectives (From admission, onward)   Start     Dose/Rate Route Frequency Ordered Stop   03/25/20 2000  vancomycin (VANCOREADY) IVPB 1500 mg/300 mL  Status:  Discontinued        1,500 mg 150 mL/hr over 120 Minutes Intravenous Every 24 hours 03/24/20 2005 03/25/20 1244   03/25/20 1000  ceFEPIme (MAXIPIME) 2 g in sodium chloride 0.9 % 100 mL IVPB        2 g 200 mL/hr over 30 Minutes Intravenous Every 12 hours 03/24/20 2005     03/24/20 2200  metroNIDAZOLE (FLAGYL) tablet 500 mg  Status:  Discontinued        500 mg Oral Every 8 hours 03/24/20 1933 03/25/20 0810   03/24/20 2015  vancomycin (VANCOCIN) IVPB 1000 mg/200 mL premix        1,000 mg 200 mL/hr over 60 Minutes Intravenous  Once 03/24/20 2005 03/24/20 2154   03/24/20 1945  ceFEPIme (MAXIPIME) 2 g in sodium chloride 0.9 % 100 mL IVPB        2 g 200 mL/hr over 30 Minutes Intravenous  Once 03/24/20 1933 03/24/20 2009   03/24/20 1945  vancomycin (VANCOCIN) IVPB 1000 mg/200 mL premix        1,000 mg 200 mL/hr over 60 Minutes Intravenous  Once 03/24/20 1933 03/24/20 2048       Subjective: Patient seen and evaluated today with no new acute complaints or concerns. No acute concerns or events noted overnight. He appears somewhat confused.  Objective: Vitals:   03/26/20 0400 03/26/20 0500 03/26/20 0600 03/26/20 0722  BP: 98/74 (!) 115/93 (!) 113/54   Pulse: 87  87 93  Resp: (!) 27 (!) 30 (!) 36 (!) 26  Temp: 98 F (36.7 C)   (!) 97.3 F (36.3 C)  TempSrc:     Oral  SpO2: 93%  92% (!) 74%  Weight:      Height:        Intake/Output Summary (Last 24 hours) at 03/26/2020 7564 Last data filed at 03/26/2020 0600 Gross per 24 hour  Intake 780 ml  Output 2100 ml  Net -1320 ml   Filed Weights   03/24/20 1814 03/25/20 1856  Weight: 124.7 kg 93.5 kg    Examination:  General exam: Appears calm and comfortable  Respiratory system: Clear to auscultation. Respiratory effort normal. On 4L [place]. Cardiovascular system: S1 & S2 heard, RRR.  Gastrointestinal system: Abdomen is nondistended, soft and nontender.  Central nervous system: Alert and oriented. No focal neurological deficits. Extremities: Bilateral ankle edema. Skin: No rashes, lesions or ulcers Psychiatry: Flat    Data Reviewed: I have personally reviewed following labs and imaging studies  CBC: Recent Labs  Lab 03/21/20 1023 03/24/20 1857 03/25/20 0419  WBC 10.2 15.8* 12.3*  NEUTROABS 5.3 11.0*  --   HGB 13.1 11.7* 11.4*  HCT 39.9 35.7* 34.9*  MCV 91.5 91.3 93.1  PLT 277 238 884   Basic Metabolic Panel: Recent Labs  Lab 03/21/20 1023 03/24/20 1857 03/25/20 0419  NA 137 137 139  K 3.0* 2.8* 3.0*  CL 101 101 108  CO2 25 27 24   GLUCOSE 121* 98 86  BUN 9 17 14   CREATININE 1.51* 1.55* 1.32*  1.29*  CALCIUM 8.4* 7.7* 6.9*  MG 1.5*  --  1.7  PHOS  --   --  2.1*   GFR: Estimated Creatinine Clearance: 57.8 mL/min (A) (by C-G formula based on SCr of 1.29 mg/dL (H)). Liver Function Tests: Recent Labs  Lab 03/21/20 1023 03/24/20 1857 03/25/20 0419  AST 16 18 15   ALT 22 22 19   ALKPHOS 50 50 44  BILITOT 0.8 0.9 0.7  PROT 6.2* 5.7* 5.2*  ALBUMIN 3.0* 2.7* 2.3*   No results for input(s): LIPASE, AMYLASE in the last 168 hours. No results for input(s): AMMONIA in the last 168 hours. Coagulation Profile: Recent Labs  Lab 03/24/20 1857 03/25/20 0419  INR 1.3* 1.2   Cardiac Enzymes: Recent Labs  Lab 03/25/20 0920  CKTOTAL 114   BNP (last 3 results) No  results for input(s): PROBNP in the last 8760 hours. HbA1C: No results for input(s): HGBA1C in the last 72 hours. CBG: Recent Labs  Lab 03/25/20 0744 03/25/20 1154 03/25/20 1710 03/25/20 2029 03/26/20 0721  GLUCAP 73 65* 86 96 76   Lipid Profile: No results for input(s): CHOL, HDL, LDLCALC, TRIG, CHOLHDL, LDLDIRECT in the last 72 hours. Thyroid Function Tests: No results for input(s): TSH, T4TOTAL, FREET4, T3FREE, THYROIDAB in the last 72 hours. Anemia Panel: No results for input(s): VITAMINB12, FOLATE, FERRITIN, TIBC, IRON, RETICCTPCT in the last 72 hours. Sepsis Labs: Recent Labs  Lab 03/24/20 1857 03/24/20 2059 03/25/20 0920  PROCALCITON  --   --  0.69  LATICACIDVEN 1.8 1.3  --     Recent Results (from the past 240 hour(s))  Respiratory Panel by RT PCR (Flu A&B, Covid) - Nasopharyngeal Swab     Status: None   Collection Time: 03/17/20  5:01 PM   Specimen: Nasopharyngeal Swab  Result Value Ref Range Status   SARS Coronavirus 2 by RT PCR NEGATIVE NEGATIVE Final    Comment: (NOTE) SARS-CoV-2 target nucleic acids are NOT DETECTED.  The SARS-CoV-2 RNA is generally detectable in upper respiratoy specimens during the acute phase of infection. The lowest concentration of SARS-CoV-2 viral copies this assay can detect is 131 copies/mL. A negative result does not preclude SARS-Cov-2 infection and should not be used as the sole basis for treatment or other patient management decisions. A negative result may occur with  improper specimen collection/handling, submission of specimen other than nasopharyngeal swab, presence of viral mutation(s) within the areas targeted by this assay, and inadequate number of viral copies (<131 copies/mL). A negative result must be combined with clinical observations, patient history, and epidemiological information. The expected result is Negative.  Fact Sheet for Patients:  PinkCheek.be  Fact Sheet for  Healthcare Providers:  GravelBags.it  This test is no t yet approved or cleared by the [place] FDA and  has been authorized for detection and/or diagnosis of SARS-CoV-2 by FDA under an Emergency Use Authorization (EUA). This EUA will remain  in effect (meaning this test can be used) for the duration of the COVID-19 declaration under Section 564(b)(1)  of the Act, 21 U.S.C. section 360bbb-3(b)(1), unless the authorization is terminated or revoked sooner.     Influenza A by PCR NEGATIVE NEGATIVE Final   Influenza B by PCR NEGATIVE NEGATIVE Final    Comment: (NOTE) The Xpert Xpress SARS-CoV-2/FLU/RSV assay is intended as an aid in  the diagnosis of influenza from Nasopharyngeal swab specimens and  should not be used as a sole basis for treatment. Nasal washings and  aspirates are unacceptable for Xpert Xpress SARS-CoV-2/FLU/RSV  testing.  Fact Sheet for Patients: PinkCheek.be  Fact Sheet for Healthcare Providers: GravelBags.it  This test is not yet approved or cleared by the [place] FDA and  has been authorized for detection and/or diagnosis of SARS-CoV-2 by  FDA under an Emergency Use Authorization (EUA). This EUA will remain  in effect (meaning this test can be used) for the duration of the  Covid-19 declaration under Section 564(b)(1) of the Act, 21  U.S.C. section 360bbb-3(b)(1), unless the authorization is  terminated or revoked. Performed at [facility], [address] [place], [place] [address]   Urine culture     Status: Abnormal   Collection Time: 03/17/20  7:00 PM   Specimen: Urine, Clean Catch  Result Value Ref Range Status   Specimen Description   Final    URINE, CLEAN CATCH Performed at [facility], [address] [place], [place] [address]    Special Requests   Final    NONE Performed at [facility], [address], [place], [place] [address]    Culture (A)   Final    <10,000 COLONIES/mL INSIGNIFICANT GROWTH Performed at [facility], [place] [address], [place], [place] [address]    Report Status 03/19/2020 FINAL  Final  Culture, blood (Routine x 2)     Status: None (Preliminary result)   Collection Time: 03/24/20  6:57 PM   Specimen: Left Antecubital; Blood  Result Value Ref Range Status   Specimen Description LEFT ANTECUBITAL  Final   Special Requests   Final    BOTTLES DRAWN AEROBIC AND ANAEROBIC Blood Culture results may not be optimal due to an inadequate volume of blood received in culture bottles   Culture   Final    NO GROWTH 2 DAYS Performed at [facility], [address] [place], [place] [address]    Report Status PENDING  Incomplete  Culture, blood (Routine x 2)     Status: None (Preliminary result)   Collection Time: 03/24/20  6:57 PM   Specimen: Right Antecubital; Blood  Result Value Ref Range Status   Specimen Description RIGHT ANTECUBITAL  Final   Special Requests   Final    BOTTLES DRAWN AEROBIC AND ANAEROBIC Blood Culture results may not be optimal due to an inadequate volume of blood received in culture bottles   Culture   Final    NO GROWTH 2 DAYS Performed at [facility], [address], [place], [place] [address]    Report Status PENDING  Incomplete  Resp Panel by RT-PCR (Flu A&B, Covid) Nasopharyngeal Swab     Status: None   Collection Time: 03/24/20  6:58 PM   Specimen: Nasopharyngeal Swab; Nasopharyngeal(NP) swabs in vial transport medium  Result Value Ref Range Status   SARS Coronavirus 2 by RT PCR NEGATIVE NEGATIVE Final    Comment: (NOTE) SARS-CoV-2 target nucleic acids are NOT DETECTED.  The SARS-CoV-2 RNA is generally detectable in upper respiratory specimens during the acute phase of infection. The lowest concentration of SARS-CoV-2 viral copies this  assay can detect is 138 copies/mL. A negative result does not preclude SARS-Cov-2 infection and should not be used as the sole basis for treatment  or other patient management decisions. A negative result may occur with  improper specimen collection/handling, submission of specimen other than nasopharyngeal swab, presence of viral mutation(s) within the areas targeted by this assay, and inadequate number of viral copies(<138 copies/mL). A negative result must be combined with clinical observations, patient history, and epidemiological information. The expected result is Negative.  Fact Sheet for Patients:  EntrepreneurPulse.com.au  Fact Sheet for Healthcare Providers:  IncredibleEmployment.be  This test is no t yet approved or cleared by the [place] FDA and  has been authorized for detection and/or diagnosis of SARS-CoV-2 by FDA under an Emergency Use Authorization (EUA). This EUA will remain  in effect (meaning this test can be used) for the duration of the COVID-19 declaration under Section 564(b)(1) of the Act, 21 U.S.C.section 360bbb-3(b)(1), unless the authorization is terminated  or revoked sooner.       Influenza A by PCR NEGATIVE NEGATIVE Final   Influenza B by PCR NEGATIVE NEGATIVE Final    Comment: (NOTE) The Xpert Xpress SARS-CoV-2/FLU/RSV plus assay is intended as an aid in the diagnosis of influenza from Nasopharyngeal swab specimens and should not be used as a sole basis for treatment. Nasal washings and aspirates are unacceptable for Xpert Xpress SARS-CoV-2/FLU/RSV testing.  Fact Sheet for Patients: EntrepreneurPulse.com.au  Fact Sheet for Healthcare Providers: IncredibleEmployment.be  This test is not yet approved or cleared by the [place] FDA and has been authorized for detection and/or diagnosis of SARS-CoV-2 by FDA under an Emergency Use Authorization (EUA). This EUA will remain in effect (meaning this test can be used) for the duration of the COVID-19 declaration under Section 564(b)(1) of the Act, 21 U.S.C. section  360bbb-3(b)(1), unless the authorization is terminated or revoked.  Performed at [facility], [address] [place], [place] [address]   MRSA PCR Screening     Status: None   Collection Time: 03/25/20  5:12 AM   Specimen: Nasal Mucosa; Nasopharyngeal  Result Value Ref Range Status   MRSA by PCR NEGATIVE NEGATIVE Final    Comment:        The GeneXpert MRSA Assay (FDA approved for NASAL specimens only), is one component of a comprehensive MRSA colonization surveillance program. It is not intended to diagnose MRSA infection nor to guide or monitor treatment for MRSA infections. Performed at [facility], [address], [place], [place] [address]          Radiology Studies: CT Head Wo Contrast  Result Date: 03/25/2020 CLINICAL DATA:  Altered mental status. EXAM: CT HEAD WITHOUT CONTRAST TECHNIQUE: Contiguous axial images were obtained from the base of the skull through the vertex without intravenous contrast. COMPARISON:  March 17, 2020 FINDINGS: Brain: There is mild cerebral atrophy with widening of the extra-axial spaces and ventricular dilatation. There are areas of decreased attenuation within the white matter tracts of the supratentorial brain, consistent with microvascular disease changes. Stable focal white matter low attenuation is noted within the posterior aspect of the right parietal lobe. Vascular: No hyperdense vessel or unexpected calcification. Skull: Normal. Negative for fracture or focal lesion. Sinuses/Orbits: No acute finding. Other: None. IMPRESSION: 1. Generalized cerebral atrophy. 2. No acute intracranial abnormality. 3. Stable findings consistent with a known small right parietal lobe metastatic lesion. Electronically Signed   By: Virgina Norfolk M.D.   On: 03/25/2020 01:16   CT  Angio Chest PE W/Cm &/Or Wo Cm  Result Date: 03/25/2020 CLINICAL DATA:  Shortness of breath. EXAM: CT ANGIOGRAPHY CHEST WITH CONTRAST TECHNIQUE: Multidetector CT imaging of the  chest was performed using the standard protocol during bolus administration of intravenous contrast. Multiplanar CT image reconstructions and MIPs were obtained to evaluate the vascular anatomy. CONTRAST:  123mL OMNIPAQUE IOHEXOL 350 MG/ML SOLN COMPARISON:  March 07, 2020 FINDINGS: Cardiovascular: There is moderate to marked severity calcification of the aortic arch. Satisfactory opacification of the pulmonary arteries to the segmental level. No evidence of pulmonary embolism. Normal heart size with moderate to marked severity coronary artery calcification. No pericardial effusion. Mediastinum/Nodes: Mild paratracheal and AP window lymphadenopathy is again seen. Small colloid cysts are again seen within the right lobe of the thyroid gland. The trachea and esophagus demonstrate no significant findings. Lungs/Pleura: Mild-to-moderate severity diffuse interstitial thickening is noted bilaterally. This is increased in severity when compared to the prior study. Marked severity multifocal infiltrates are also seen throughout both lungs. This represents a new finding when compared to the prior exam. A 4.1 cm x 1.6 cm posteromedial right upper lobe lung mass is seen. This measures approximately 3.3 cm x 1.4 cm on the prior study. There is no evidence of a pleural effusion or pneumothorax. Upper Abdomen: No acute abnormality. Musculoskeletal: Degenerative changes are noted throughout the thoracic spine. Review of the MIP images confirms the above findings. IMPRESSION: 1. No evidence of pulmonary embolism. 2. Marked severity bilateral multifocal infiltrates. 3. Diffuse interstitial thickening the which may represent sequelae associated with superimposed pulmonary edema. 4. Posteromedial right upper lobe lung mass which is increased in size when compared to the prior exam. This may be, in part, secondary to surrounding areas of adjacent airspace disease. Correlation with follow-up chest plain film is recommended. 5. Moderate  to marked severity coronary artery disease. 6. Aortic atherosclerosis. Aortic Atherosclerosis (ICD10-I70.0). Electronically Signed   By: Virgina Norfolk M.D.   On: 03/25/2020 01:29   DG Chest Port 1 View  Result Date: 03/25/2020 CLINICAL DATA:  Shortness of breath. EXAM: PORTABLE CHEST 1 VIEW COMPARISON:  03/25/2020 CT.  03/24/2020 and prior radiographs FINDINGS: This is a low volume study. The cardiomediastinal silhouette is unchanged. AA LEFT subclavian Port-A-Cath is present with tip overlying the UPPER SVC. Diffuse bilateral airspace opacities does not appear significantly changed from 03/25/2020 CT but increased from 03/24/2020 chest radiograph. No pneumothorax or definite pleural effusion noted. IMPRESSION: Diffuse bilateral airspace opacities, not significantly changed from 03/25/2020 CT but increased from 03/24/2020 chest radiograph. Electronically Signed   By: Margarette Canada M.D.   On: 03/25/2020 06:50   DG Chest Port 1 View  Result Date: 03/24/2020 CLINICAL DATA:  Suspected sepsis.  Metastatic lung cancer. EXAM: PORTABLE CHEST 1 VIEW COMPARISON:  03/17/2020 FINDINGS: Left Port-A-Cath remains in place, unchanged. Cardiomegaly. Low lung volumes. Right upper lobe nodule/mass seen on prior CT not appreciable on plain film. Chronic interstitial changes. Bibasilar airspace opacities. No effusions or acute bony abnormality. IMPRESSION: Low lung volumes.  Bibasilar atelectasis or infiltrates. Cardiomegaly. Chronic interstitial lung disease. Electronically Signed   By: Rolm Baptise M.D.   On: 03/24/2020 20:18   ECHOCARDIOGRAM COMPLETE  Result Date: 03/25/2020    ECHOCARDIOGRAM REPORT   Patient Name:   CANAAN HOLZER Date of Exam: 03/25/2020 Medical Rec #:  [id number]     Height:       64.0 in Accession #:    [id number]    Weight:  275.0 lb Date of Birth:  Jan 11, 1946     BSA:          2.240 m Patient Age:    71 years      BP:           149/94 mmHg Patient Gender: M             HR:           93 bpm.  Exam Location:  [facility] Procedure: 2D Echo Indications:    CHF-Acute Systolic 709.62 / E36.62  History:        Patient has prior history of Echocardiogram examinations, most                 recent 10/07/2003. Risk Factors:Former Smoker, Diabetes and                 Hypertension. Pulmonary Embolus, Brain Metastasisi, Lung Cancer.  Sonographer:    Leavy Cella RDCS (AE) Referring Phys: [id number] OLADAPO ADEFESO IMPRESSIONS  1. Left ventricular ejection fraction, by estimation, is 30 to 35%. The left ventricle has moderately decreased function. The left ventricle demonstrates global hypokinesis. There is moderate left ventricular hypertrophy. Left ventricular diastolic parameters are consistent with Grade I diastolic dysfunction (impaired relaxation).  2. Right ventricular systolic function was not well visualized. The right ventricular size is normal.  3. The pericardial effusion is circumferential. There is no evidence of cardiac tamponade.  4. The mitral valve is normal in structure. No evidence of mitral valve regurgitation. No evidence of mitral stenosis.  5. The aortic valve was not well visualized. Aortic valve regurgitation is trivial. No aortic stenosis is present.  6. The inferior vena cava is normal in size with greater than 50% respiratory variability, suggesting right atrial pressure of 3 mmHg. FINDINGS  Left Ventricle: Left ventricular ejection fraction, by estimation, is 30 to 35%. The left ventricle has moderately decreased function. The left ventricle demonstrates global hypokinesis. The left ventricular internal cavity size was normal in size. There is moderate left ventricular hypertrophy. Left ventricular diastolic parameters are consistent with Grade I diastolic dysfunction (impaired relaxation). Right Ventricle: The right ventricular size is normal. No increase in right ventricular wall thickness. Right ventricular systolic function was not well visualized. Left Atrium: Left atrial size was  normal in size. Right Atrium: Right atrial size was normal in size. Pericardium: Trivial pericardial effusion is present. The pericardial effusion is circumferential. There is no evidence of cardiac tamponade. Mitral Valve: The mitral valve is normal in structure. No evidence of mitral valve regurgitation. No evidence of mitral valve stenosis. Tricuspid Valve: The tricuspid valve is normal in structure. Tricuspid valve regurgitation is not demonstrated. No evidence of tricuspid stenosis. Aortic Valve: The aortic valve was not well visualized. Aortic valve regurgitation is trivial. No aortic stenosis is present. Pulmonic Valve: The pulmonic valve was normal in structure. Pulmonic valve regurgitation is not visualized. No evidence of pulmonic stenosis. Aorta: The aortic root is normal in size and structure. Venous: The inferior vena cava is normal in size with greater than 50% respiratory variability, suggesting right atrial pressure of 3 mmHg. IAS/Shunts: No atrial level shunt detected by color flow Doppler.  LEFT VENTRICLE PLAX 2D LVIDd:         4.89 cm  Diastology LVIDs:         3.97 cm  LV e' lateral:   8.16 cm/s LV PW:         1.56 cm  LV E/e' lateral: 10.7  LV IVS:        1.18 cm LVOT diam:     2.20 cm LVOT Area:     3.80 cm  RIGHT VENTRICLE RV S prime:     15.50 cm/s TAPSE (M-mode): 2.0 cm LEFT ATRIUM           Index       RIGHT ATRIUM           Index LA diam:      3.80 cm 1.70 cm/m  RA Area:     14.00 cm LA Vol (A2C): 57.9 ml 25.85 ml/m RA Volume:   36.70 ml  16.38 ml/m LA Vol (A4C): 59.9 ml 26.74 ml/m   AORTA Ao Root diam: 2.90 cm MITRAL VALVE MV Area (PHT): 5.29 cm    SHUNTS MV Decel Time: 144 msec    Systemic Diam: 2.20 cm MV E velocity: 87.20 cm/s Candee Furbish MD Electronically signed by Candee Furbish MD Signature Date/Time: 03/25/2020/11:18:14 AM    Final         Scheduled Meds: . amLODipine  2.5 mg Oral Daily  . Chlorhexidine Gluconate Cloth  6 each Topical Daily  . feeding supplement  (GLUCERNA SHAKE)  237 mL Oral TID BM  . heparin  5,000 Units Subcutaneous Q8H  . insulin aspart  0-5 Units Subcutaneous QHS  . insulin aspart  0-9 Units Subcutaneous TID WC  . metoprolol succinate  25 mg Oral QPM  . predniSONE  50 mg Oral Q breakfast  . simvastatin  20 mg Oral QPM   Continuous Infusions: . ceFEPime (MAXIPIME) IV 2 g (03/25/20 2133)     LOS: 1 day    Time spent: 35 minutes    Eldonna Neuenfeldt D Manuella Ghazi, DO [facility]  If 7PM-7AM, please contact night-coverage [email or web address] 03/26/2020, 8:22 AM

## 2020-03-27 ENCOUNTER — Encounter (HOSPITAL_COMMUNITY): Payer: Self-pay | Admitting: Internal Medicine

## 2020-03-27 DIAGNOSIS — J9601 Acute respiratory failure with hypoxia: Secondary | ICD-10-CM | POA: Diagnosis not present

## 2020-03-27 DIAGNOSIS — Z515 Encounter for palliative care: Secondary | ICD-10-CM

## 2020-03-27 DIAGNOSIS — C3490 Malignant neoplasm of unspecified part of unspecified bronchus or lung: Secondary | ICD-10-CM

## 2020-03-27 DIAGNOSIS — Z7189 Other specified counseling: Secondary | ICD-10-CM

## 2020-03-27 LAB — MAGNESIUM: Magnesium: 2.1 mg/dL (ref 1.7–2.4)

## 2020-03-27 LAB — BASIC METABOLIC PANEL
Anion gap: 11 (ref 5–15)
BUN: 20 mg/dL (ref 8–23)
CO2: 25 mmol/L (ref 22–32)
Calcium: 8.2 mg/dL — ABNORMAL LOW (ref 8.9–10.3)
Chloride: 100 mmol/L (ref 98–111)
Creatinine, Ser: 1.32 mg/dL — ABNORMAL HIGH (ref 0.61–1.24)
GFR, Estimated: 56 mL/min — ABNORMAL LOW (ref >60)
Glucose, Bld: 92 mg/dL (ref 70–99)
Potassium: 3.2 mmol/L — ABNORMAL LOW (ref 3.5–5.1)
Sodium: 136 mmol/L (ref 135–145)

## 2020-03-27 LAB — CBC
HCT: 37.8 % — ABNORMAL LOW (ref 39.0–52.0)
Hemoglobin: 12.3 g/dL — ABNORMAL LOW (ref 13.0–17.0)
MCH: 29.5 pg (ref 26.0–34.0)
MCHC: 32.5 g/dL (ref 30.0–36.0)
MCV: 90.6 fL (ref 80.0–100.0)
Platelets: 260 10*3/uL (ref 150–400)
RBC: 4.17 MIL/uL — ABNORMAL LOW (ref 4.22–5.81)
RDW: 15.4 % (ref 11.5–15.5)
WBC: 13.3 10*3/uL — ABNORMAL HIGH (ref 4.0–10.5)
nRBC: 0 % (ref 0.0–0.2)

## 2020-03-27 LAB — LIPID PANEL
Cholesterol: 121 mg/dL (ref 0–200)
HDL: 41 mg/dL (ref >40)
LDL Cholesterol: 66 mg/dL (ref 0–99)
Total CHOL/HDL Ratio: 3 RATIO
Triglycerides: 72 mg/dL (ref <150)
VLDL: 14 mg/dL (ref 0–40)

## 2020-03-27 LAB — GLUCOSE, CAPILLARY
Glucose-Capillary: 104 mg/dL — ABNORMAL HIGH (ref 70–99)
Glucose-Capillary: 198 mg/dL — ABNORMAL HIGH (ref 70–99)
Glucose-Capillary: 298 mg/dL — ABNORMAL HIGH (ref 70–99)
Glucose-Capillary: 198 mg/dL — ABNORMAL HIGH (ref 70–99)

## 2020-03-27 MED ORDER — METOPROLOL SUCCINATE ER 25 MG PO TB24
25.0000 mg | ORAL_TABLET | Freq: Two times a day (BID) | ORAL | Status: DC
  Administered 2020-03-27 – 2020-03-31 (×9): 25 mg via ORAL
  Filled 2020-03-27 (×9): qty 1

## 2020-03-27 MED ORDER — POTASSIUM CHLORIDE CRYS ER 20 MEQ PO TBCR
40.0000 meq | EXTENDED_RELEASE_TABLET | Freq: Once | ORAL | Status: AC
  Administered 2020-03-27: 40 meq via ORAL
  Filled 2020-03-27: qty 2

## 2020-03-27 MED ORDER — POTASSIUM CHLORIDE 10 MEQ/100ML IV SOLN
10.0000 meq | INTRAVENOUS | Status: AC
  Administered 2020-03-27 (×4): 10 meq via INTRAVENOUS
  Filled 2020-03-27 (×4): qty 100

## 2020-03-27 MED ORDER — FUROSEMIDE 10 MG/ML IJ SOLN
40.0000 mg | Freq: Two times a day (BID) | INTRAMUSCULAR | Status: DC
  Administered 2020-03-27 – 2020-03-28 (×4): 40 mg via INTRAVENOUS
  Filled 2020-03-27 (×5): qty 4

## 2020-03-27 NOTE — Progress Notes (Signed)
PROGRESS NOTE    Darrell Christensen  AYT:[id number] DOB: 01/13/1946 DOA: 03/24/2020 PCP: Darrell Squibb, MD   Brief Narrative:  Per HPI: Darrell Christensen a 74 y.o.malewith medical history significant forstage IV lung adenocarcinoma, followed by Dr. Delton Christensen with known brain mets, CKD, hypertension, hyperlipidemia, diabetes mellitus, hypertensionwho presents to the emergency department due to a fall sustained at home and he was too weak to get up. Patient was unable to provide history of why he came to the ED, his only complaint was bilateral leg swelling. History was obtained from ED physician and from ED medical record. Per report, Family member checked on him and found him on the floor (he lives by himself), so he was brought to the ED with concern the patient is unable to live by himself and that he might need a facility care. 1 tablet of Xanax (dose unknown) was given prior to bringing the patient to the ED. There was no complaint of fever, chills, chest pain, nausea vomiting abdominal pain.  -Patient was admitted with acute hypoxemic respiratory failure in the setting of possible HCAP/aspiration pneumonia with associated sepsis on admission as well as concern for pulmonary edema with possible congestive heart failure. He is noted to be on chronic prednisone which may contribute to the leukocytosis. His primary concern on admission was lower extremity edema and weakness. He was recently discharged from the hospital on 11/13 and was admitted for AKI/dehydration at that time and received IV fluids. His 2D echocardiogram on 11/20 demonstrates a significant reduction in LV function with global hypokinesis and LVEF of 30-35%. Patient does state that he is DNR. He will need close cardiology follow-up by 11/22 or sooner if there is concern for an acute process. Plan is for placement to SNF on discharge.  Assessment & Plan:   Active Problems:   Primary cancer of right upper lobe of lung  (HCC)   HTN (hypertension), benign   Diabetes mellitus ([place])   Brain metastasis ([place])   Acute encephalopathy   Acute respiratory failure with hypoxia (HCC)   Generalized weakness   Fall at home, initial encounter   Leukocytosis   Hypokalemia   Hypoalbuminemia   Pulmonary edema   Healthcare-associated pneumonia   Diabetic neuropathy (HCC)  Acute hypoxemic respiratory failure-multifactorial; improving -Currently on 5L [place] and without respiratory distress -In setting of HCAP and more likely acute systolic and diastolic CHF exacerbation -Continue on cefepime empirically; blood cultures x 2 negative -Wean O2 as tolerated  Acute systolic and diastolic CHF exacerbation-improving -IV Lasix for diuresis resumed bid on 11/22 -Resume home metoprolol -Monitor daily weights -Strict I's and O's -Monitor potassium levels closely  Acute encephalopathy-improved -Further labs negative -CT head with no acute findings and stable atrophy and mets -No hypercapnia noted  Hypokalemia-persistent -Likely related to diarrhea ongoing -Replete IV and oral -Recheck with mag in a.m. -Monitor carefully with Lasix  Stage IV lung adenocarcinoma with brain metastasis -Follows with Dr. Delton Christensen outpatient, chemo recently held due to diarrhea -Patient is DNR  CKD stage III -Current creatinine 1.3 with baseline near 1.5-1.8 -Monitor closely with Lasix use  Hypertension-elevated -Continue blood pressure agents from home as ordered -Monitor with aggressive IV diuresis, holding home torsemide -IV labetalol as needed  Peripheral neuropathy -Plan to resume home duloxetine  Type 2 diabetes -Without any significant hyperglycemia currently -Some noted hypoglycemia, maintain on sliding scale insulin for now  Moderate protein calorie malnutrition   DVT prophylaxis:Heparin Code Status: DNR Family Communication: Discussed with daughter on  phone 11/22 Disposition Plan:  Status is:  Inpatient  Remains inpatient appropriate because:IV treatments appropriate due to intensity of illness or inability to take PO and Inpatient level of care appropriate due to severity of illness   Dispo: The patient is from: Home  Anticipated d/c is to: SNF  Anticipated d/c date is: 2-3 days  Patient currently is not medically stable to d/c.  Consultants:   Cardiology  Palliative  Procedures:   See below  Antimicrobials:  Anti-infectives (From admission, onward)   Start     Dose/Rate Route Frequency Ordered Stop   03/25/20 2000  vancomycin (VANCOREADY) IVPB 1500 mg/300 mL  Status:  Discontinued        1,500 mg 150 mL/hr over 120 Minutes Intravenous Every 24 hours 03/24/20 2005 03/25/20 1244   03/25/20 1000  ceFEPIme (MAXIPIME) 2 g in sodium chloride 0.9 % 100 mL IVPB        2 g 200 mL/hr over 30 Minutes Intravenous Every 12 hours 03/24/20 2005     03/24/20 2200  metroNIDAZOLE (FLAGYL) tablet 500 mg  Status:  Discontinued        500 mg Oral Every 8 hours 03/24/20 1933 03/25/20 0810   03/24/20 2015  vancomycin (VANCOCIN) IVPB 1000 mg/200 mL premix        1,000 mg 200 mL/hr over 60 Minutes Intravenous  Once 03/24/20 2005 03/24/20 2154   03/24/20 1945  ceFEPIme (MAXIPIME) 2 g in sodium chloride 0.9 % 100 mL IVPB        2 g 200 mL/hr over 30 Minutes Intravenous  Once 03/24/20 1933 03/24/20 2009   03/24/20 1945  vancomycin (VANCOCIN) IVPB 1000 mg/200 mL premix        1,000 mg 200 mL/hr over 60 Minutes Intravenous  Once 03/24/20 1933 03/24/20 2048       Subjective: Patient seen and evaluated today with no new acute complaints or concerns. No acute concerns or events noted overnight.  Objective: Vitals:   03/27/20 1000 03/27/20 1015 03/27/20 1100 03/27/20 1133  BP:  133/79 140/80   Pulse: 91 86 94   Resp: (!) 42  (!) 33   Temp:    (!) 97.3 F (36.3 C)  TempSrc:    Oral  SpO2: 98%  93%   Weight:      Height:         Intake/Output Summary (Last 24 hours) at 03/27/2020 1159 Last data filed at 03/27/2020 1100 Gross per 24 hour  Intake 1070.06 ml  Output 1250 ml  Net -179.94 ml   Filed Weights   03/25/20 1856 03/26/20 0500 03/27/20 0700  Weight: 93.5 kg 93.5 kg 94.2 kg    Examination:  General exam: Appears calm and comfortable  Respiratory system: Clear to auscultation. Respiratory effort normal. Currently on 5L [place] Cardiovascular system: S1 & S2 heard, RRR.  Gastrointestinal system: Abdomen is nondistended, soft and nontender. Central nervous system: Alert and awake Extremities: Mild ankle edema Skin: No rashes, lesions or ulcers Psychiatry: Flat affect    Data Reviewed: I have personally reviewed following labs and imaging studies  CBC: Recent Labs  Lab 03/21/20 1023 03/24/20 1857 03/25/20 0419 03/26/20 1139 03/27/20 0419  WBC 10.2 15.8* 12.3* 11.5* 13.3*  NEUTROABS 5.3 11.0*  --   --   --   HGB 13.1 11.7* 11.4* 11.4* 12.3*  HCT 39.9 35.7* 34.9* 34.3* 37.8*  MCV 91.5 91.3 93.1 91.0 90.6  PLT 277 238 233 218 371   Basic Metabolic Panel: Recent  Labs  Lab 03/21/20 1023 03/24/20 1857 03/25/20 0419 03/26/20 1139 03/27/20 0419  NA 137 137 139 134* 136  K 3.0* 2.8* 3.0* 3.0* 3.2*  CL 101 101 108 98 100  CO2 25 27 24 27 25   GLUCOSE 121* 98 86 192* 92  BUN 9 17 14 16 20   CREATININE 1.51* 1.55* 1.32*  1.29* 1.39* 1.32*  CALCIUM 8.4* 7.7* 6.9* 7.3* 8.2*  MG 1.5*  --  1.7 1.6* 2.1  PHOS  --   --  2.1*  --   --    GFR: Estimated Creatinine Clearance: 56.7 mL/min (A) (by C-G formula based on SCr of 1.32 mg/dL (H)). Liver Function Tests: Recent Labs  Lab 03/21/20 1023 03/24/20 1857 03/25/20 0419  AST 16 18 15   ALT 22 22 19   ALKPHOS 50 50 44  BILITOT 0.8 0.9 0.7  PROT 6.2* 5.7* 5.2*  ALBUMIN 3.0* 2.7* 2.3*   No results for input(s): LIPASE, AMYLASE in the last 168 hours. Recent Labs  Lab 03/26/20 1139  AMMONIA 10   Coagulation Profile: Recent Labs  Lab  03/24/20 1857 03/25/20 0419  INR 1.3* 1.2   Cardiac Enzymes: Recent Labs  Lab 03/25/20 0920  CKTOTAL 114   BNP (last 3 results) No results for input(s): PROBNP in the last 8760 hours. HbA1C: No results for input(s): HGBA1C in the last 72 hours. CBG: Recent Labs  Lab 03/26/20 1128 03/26/20 1638 03/26/20 2037 03/27/20 0738 03/27/20 1108  GLUCAP 183* 270* 243* 104* 198*   Lipid Profile: No results for input(s): CHOL, HDL, LDLCALC, TRIG, CHOLHDL, LDLDIRECT in the last 72 hours. Thyroid Function Tests: Recent Labs    03/26/20 1139  TSH 1.274   Anemia Panel: No results for input(s): VITAMINB12, FOLATE, FERRITIN, TIBC, IRON, RETICCTPCT in the last 72 hours. Sepsis Labs: Recent Labs  Lab 03/24/20 1857 03/24/20 2059 03/25/20 0920  PROCALCITON  --   --  0.69  LATICACIDVEN 1.8 1.3  --     Recent Results (from the past 240 hour(s))  Respiratory Panel by RT PCR (Flu A&B, Covid) - Nasopharyngeal Swab     Status: None   Collection Time: 03/17/20  5:01 PM   Specimen: Nasopharyngeal Swab  Result Value Ref Range Status   SARS Coronavirus 2 by RT PCR NEGATIVE NEGATIVE Final    Comment: (NOTE) SARS-CoV-2 target nucleic acids are NOT DETECTED.  The SARS-CoV-2 RNA is generally detectable in upper respiratoy specimens during the acute phase of infection. The lowest concentration of SARS-CoV-2 viral copies this assay can detect is 131 copies/mL. A negative result does not preclude SARS-Cov-2 infection and should not be used as the sole basis for treatment or other patient management decisions. A negative result may occur with  improper specimen collection/handling, submission of specimen other than nasopharyngeal swab, presence of viral mutation(s) within the areas targeted by this assay, and inadequate number of viral copies (<131 copies/mL). A negative result must be combined with clinical observations, patient history, and epidemiological information. The expected result  is Negative.  Fact Sheet for Patients:  PinkCheek.be  Fact Sheet for Healthcare Providers:  GravelBags.it  This test is no t yet approved or cleared by the [place] FDA and  has been authorized for detection and/or diagnosis of SARS-CoV-2 by FDA under an Emergency Use Authorization (EUA). This EUA will remain  in effect (meaning this test can be used) for the duration of the COVID-19 declaration under Section 564(b)(1) of the Act, 21 U.S.C. section 360bbb-3(b)(1), unless the authorization  is terminated or revoked sooner.     Influenza A by PCR NEGATIVE NEGATIVE Final   Influenza B by PCR NEGATIVE NEGATIVE Final    Comment: (NOTE) The Xpert Xpress SARS-CoV-2/FLU/RSV assay is intended as an aid in  the diagnosis of influenza from Nasopharyngeal swab specimens and  should not be used as a sole basis for treatment. Nasal washings and  aspirates are unacceptable for Xpert Xpress SARS-CoV-2/FLU/RSV  testing.  Fact Sheet for Patients: PinkCheek.be  Fact Sheet for Healthcare Providers: GravelBags.it  This test is not yet approved or cleared by the [place] FDA and  has been authorized for detection and/or diagnosis of SARS-CoV-2 by  FDA under an Emergency Use Authorization (EUA). This EUA will remain  in effect (meaning this test can be used) for the duration of the  Covid-19 declaration under Section 564(b)(1) of the Act, 21  U.S.C. section 360bbb-3(b)(1), unless the authorization is  terminated or revoked. Performed at [facility], [address] [place], [place] [address]   Urine culture     Status: Abnormal   Collection Time: 03/17/20  7:00 PM   Specimen: Urine, Clean Catch  Result Value Ref Range Status   Specimen Description   Final    URINE, CLEAN CATCH Performed at [facility], [address] [place], [place] [address]    Special Requests    Final    NONE Performed at [facility], [address], [place], [place] [address]    Culture (A)  Final    <10,000 COLONIES/mL INSIGNIFICANT GROWTH Performed at [facility], [place] [address] [place], [place] [address]    Report Status 03/19/2020 FINAL  Final  Culture, blood (Routine x 2)     Status: None (Preliminary result)   Collection Time: 03/24/20  6:57 PM   Specimen: Left Antecubital; Blood  Result Value Ref Range Status   Specimen Description LEFT ANTECUBITAL  Final   Special Requests   Final    BOTTLES DRAWN AEROBIC AND ANAEROBIC Blood Culture results may not be optimal due to an inadequate volume of blood received in culture bottles   Culture   Final    NO GROWTH 2 DAYS Performed at [facility], [address], [place], [place] [address]    Report Status PENDING  Incomplete  Culture, blood (Routine x 2)     Status: None (Preliminary result)   Collection Time: 03/24/20  6:57 PM   Specimen: Right Antecubital; Blood  Result Value Ref Range Status   Specimen Description RIGHT ANTECUBITAL  Final   Special Requests   Final    BOTTLES DRAWN AEROBIC AND ANAEROBIC Blood Culture results may not be optimal due to an inadequate volume of blood received in culture bottles   Culture   Final    NO GROWTH 2 DAYS Performed at [facility], [address], [place], [place] [address]    Report Status PENDING  Incomplete  Resp Panel by RT-PCR (Flu A&B, Covid) Nasopharyngeal Swab     Status: None   Collection Time: 03/24/20  6:58 PM   Specimen: Nasopharyngeal Swab; Nasopharyngeal(NP) swabs in vial transport medium  Result Value Ref Range Status   SARS Coronavirus 2 by RT PCR NEGATIVE NEGATIVE Final    Comment: (NOTE) SARS-CoV-2 target nucleic acids are NOT DETECTED.  The SARS-CoV-2 RNA is generally detectable in upper respiratory specimens during the acute phase of infection. The lowest concentration of SARS-CoV-2 viral copies this assay can detect is 138 copies/mL. A negative  result  does not preclude SARS-Cov-2 infection and should not be used as the sole basis for treatment or other patient management decisions. A negative result may occur with  improper specimen collection/handling, submission of specimen other than nasopharyngeal swab, presence of viral mutation(s) within the areas targeted by this assay, and inadequate number of viral copies(<138 copies/mL). A negative result must be combined with clinical observations, patient history, and epidemiological information. The expected result is Negative.  Fact Sheet for Patients:  EntrepreneurPulse.com.au  Fact Sheet for Healthcare Providers:  IncredibleEmployment.be  This test is no t yet approved or cleared by the [place] FDA and  has been authorized for detection and/or diagnosis of SARS-CoV-2 by FDA under an Emergency Use Authorization (EUA). This EUA will remain  in effect (meaning this test can be used) for the duration of the COVID-19 declaration under Section 564(b)(1) of the Act, 21 U.S.C.section 360bbb-3(b)(1), unless the authorization is terminated  or revoked sooner.       Influenza A by PCR NEGATIVE NEGATIVE Final   Influenza B by PCR NEGATIVE NEGATIVE Final    Comment: (NOTE) The Xpert Xpress SARS-CoV-2/FLU/RSV plus assay is intended as an aid in the diagnosis of influenza from Nasopharyngeal swab specimens and should not be used as a sole basis for treatment. Nasal washings and aspirates are unacceptable for Xpert Xpress SARS-CoV-2/FLU/RSV testing.  Fact Sheet for Patients: EntrepreneurPulse.com.au  Fact Sheet for Healthcare Providers: IncredibleEmployment.be  This test is not yet approved or cleared by the [place] FDA and has been authorized for detection and/or diagnosis of SARS-CoV-2 by FDA under an Emergency Use Authorization (EUA). This EUA will remain in effect (meaning this test can be used)  for the duration of the COVID-19 declaration under Section 564(b)(1) of the Act, 21 U.S.C. section 360bbb-3(b)(1), unless the authorization is terminated or revoked.  Performed at [facility], [address], [place], [place] [address]   MRSA PCR Screening     Status: None   Collection Time: 03/25/20  5:12 AM   Specimen: Nasal Mucosa; Nasopharyngeal  Result Value Ref Range Status   MRSA by PCR NEGATIVE NEGATIVE Final    Comment:        The GeneXpert MRSA Assay (FDA approved for NASAL specimens only), is one component of a comprehensive MRSA colonization surveillance program. It is not intended to diagnose MRSA infection nor to guide or monitor treatment for MRSA infections. Performed at [facility], [address], [place], [place] [address]          Radiology Studies: No results found.      Scheduled Meds: . Chlorhexidine Gluconate Cloth  6 each Topical Daily  . feeding supplement (GLUCERNA SHAKE)  237 mL Oral TID BM  . furosemide  40 mg Intravenous Q12H  . heparin  5,000 Units Subcutaneous Q8H  . insulin aspart  0-5 Units Subcutaneous QHS  . insulin aspart  0-9 Units Subcutaneous TID WC  . metoprolol succinate  25 mg Oral BID  . predniSONE  50 mg Oral Q breakfast  . simvastatin  20 mg Oral QPM   Continuous Infusions: . ceFEPime (MAXIPIME) IV 2 g (03/27/20 1027)  . potassium chloride 10 mEq (03/27/20 1123)     LOS: 2 days    Time spent: 30 minutes    Sharmon Cheramie Darleen Crocker, DO [facility]  If 7PM-7AM, please contact night-coverage [email or web address] 03/27/2020, 11:59 AM

## 2020-03-27 NOTE — Plan of Care (Signed)
  Problem: Acute Rehab PT Goals(only PT should resolve) Goal: Pt Will Go Supine/Side To Sit Outcome: Progressing Flowsheets (Taken 03/27/2020 1015) Pt will go Supine/Side to Sit:  with min guard assist  with supervision Goal: Patient Will Transfer Sit To/From Stand Outcome: Progressing Flowsheets (Taken 03/27/2020 1015) Patient will transfer sit to/from stand:  with min guard assist  with supervision Goal: Pt Will Transfer Bed To Chair/Chair To Bed Outcome: Progressing Flowsheets (Taken 03/27/2020 1015) Pt will Transfer Bed to Chair/Chair to Bed: min guard assist Goal: Pt Will Ambulate Outcome: Progressing Flowsheets (Taken 03/27/2020 1015) Pt will Ambulate:  50 feet  with min guard assist  with minimal assist  with rolling walker   10:16 AM, 03/27/20 Lonell Grandchild, MPT Physical Therapist with [facility] [phone] office [phone] mobile phone

## 2020-03-27 NOTE — Evaluation (Signed)
Occupational Therapy Evaluation Patient Details Name: Darrell Christensen MRN: [id number] DOB: [birth year] Today's Date: 03/27/2020    History of Present Illness Darrell Christensen is a 74 y.o. male with medical history significant for stage IV lung adenocarcinoma, followed by Dr. Delton Coombes with known brain mets, CKD, hypertension, hyperlipidemia, diabetes mellitus, hypertension who presents to the emergency department due to a fall sustained at home and he was too weak to get up.  Patient was unable to provide history of why he came to the ED, his only complaint was bilateral leg swelling.  History was obtained from ED physician and from ED medical record.  Per report, Family member checked on him and found him on the floor (he lives by himself), so he was brought to the ED with concern the patient is unable to live by himself and that he might need a facility care.  1 tablet of Xanax (dose unknown) was given prior to bringing the patient to the ED.  There was no complaint of fever, chills, chest pain, nausea vomiting abdominal pain   Clinical Impression   Pt admitted with problem listed above. Pt currently with functional limitations due to the deficits listed below (see OT Problem List). Patient is currently requiring physical assistance to complete basic ADL tasks due to decrease strength and activity tolerance. As patient lives alone, with is functional performance being how it is, I recommend SNF at discharge. Patient is not wanting SNF so we will keep him on OT caseload and continue to assess.  Pt will benefit from skilled OT to increase their safety and independence with ADL and functional mobility for ADL to facilitate discharge to venue listed below.       Follow Up Recommendations  SNF    Equipment Recommendations  None recommended by OT       Precautions / Restrictions Precautions Precautions: Fall Precaution Comments: Oxygen Restrictions Weight Bearing Restrictions: No          Balance Overall balance assessment: Needs assistance Sitting-balance support: Feet supported;No upper extremity supported Sitting balance-Leahy Scale: Good Sitting balance - Comments: seated at EOB      ADL either performed or assessed with clinical judgement   ADL Overall ADL's : Needs assistance/impaired Eating/Feeding: Modified independent;Bed level   Grooming: Wash/dry face;Wash/dry hands;Set up;Sitting   Upper Body Bathing: Sitting;Set up   Lower Body Bathing: Minimal assistance;Sitting/lateral leans;Sit to/from stand   Upper Body Dressing : Set up;Sitting   Lower Body Dressing: Minimal assistance;Sit to/from stand;Sitting/lateral leans   Toilet Transfer: Regular Toilet;RW;Ambulation;Moderate assistance   Toileting- Clothing Manipulation and Hygiene: Total assistance;Sit to/from stand               Vision Baseline Vision/History: Wears glasses Wears Glasses: At all times Patient Visual Report: No change from baseline              Pertinent Vitals/Pain Pain Assessment: No/denies pain     Hand Dominance Right   Extremity/Trunk Assessment Upper Extremity Assessment Upper Extremity Assessment: Generalized weakness   Lower Extremity Assessment Lower Extremity Assessment: Defer to PT evaluation   Cervical / Trunk Assessment Cervical / Trunk Assessment: Normal   Communication Communication Communication: No difficulties   Cognition Arousal/Alertness: Awake/alert Behavior During Therapy: WFL for tasks assessed/performed Overall Cognitive Status: Within Functional Limits for tasks assessed                      Home Living Family/patient expects to be discharged to:: Private residence Living Arrangements:  Alone Available Help at Discharge: Family;Available PRN/intermittently Type of Home: Mobile home Home Access: Stairs to enter Entrance Stairs-Number of Steps: 3 Entrance Stairs-Rails: Can reach both;Left;Right Home Layout: One level      Bathroom Shower/Tub: Teacher, early years/pre: Handicapped height Bathroom Accessibility: Yes   Home Equipment: Environmental consultant - 2 wheels;Cane - single point;Shower seat          Prior Functioning/Environment Level of Independence: Independent with assistive device(s)  Gait / Transfers Assistance Needed: household and short distanced community ambulator, drives ADL's / Homemaking Assistance Needed: Mod I with ADL tasks.   Comments: No home Oxygen use.        OT Problem List: Decreased strength;Decreased activity tolerance;Decreased knowledge of use of DME or AE;Impaired balance (sitting and/or standing)      OT Treatment/Interventions: Self-care/ADL training;Therapeutic exercise;Therapeutic activities;Neuromuscular education;Energy conservation;Patient/family education;DME and/or AE instruction;Modalities;Manual therapy;Balance training    OT Goals(Current goals can be found in the care plan section) Acute Rehab OT Goals Patient Stated Goal: return home OT Goal Formulation: With patient Time For Goal Achievement: 04/10/20 Potential to Achieve Goals: Good  OT Frequency: Min 2X/week   Barriers to D/C: Decreased caregiver support  lives home alone       Co-evaluation PT/OT/SLP Co-Evaluation/Treatment: Yes Reason for Co-Treatment: To address functional/ADL transfers   OT goals addressed during session: ADL's and self-care;Proper use of Adaptive equipment and DME      AM-PAC OT "6 Clicks" Daily Activity     Outcome Measure Help from another person eating meals?: None Help from another person taking care of personal grooming?: A Little Help from another person toileting, which includes using toliet, bedpan, or urinal?: A Lot Help from another person bathing (including washing, rinsing, drying)?: A Lot Help from another person to put on and taking off regular upper body clothing?: A Little Help from another person to put on and taking off regular lower body clothing?: A  Lot 6 Click Score: 16   End of Session Equipment Utilized During Treatment: Rolling walker;Oxygen  Activity Tolerance: Patient tolerated treatment well Patient left: in chair;with call bell/phone within reach  OT Visit Diagnosis: Muscle weakness (generalized) (M62.81);History of falling (Z91.81)                Time: 2500-3704 OT Time Calculation (min): 21 min Charges:  OT General Charges $OT Visit: 1 Visit OT Evaluation $OT Eval Low Complexity: [place], OTR/L,CBIS  [phone]   [place]co Marchitto, Clarene Duke 03/27/2020, 10:57 AM

## 2020-03-27 NOTE — Progress Notes (Signed)
Pharmacy Antibiotic Note  Darrell Christensen is a 74 y.o. male admitted on 03/24/2020 with sepsis.  Pharmacy has been consulted for Vancomycin / Cefepime  dosing.  Plan: Cefepime 2000 mg IV every 12 hours. Monitor labs, c/s, and de-escalation.  Follow up Scr, cultures, progress  Height: 5\' 11"  (180.3 cm) Weight: 94.2 kg (207 lb 10.8 oz) IBW/kg (Calculated) : 75.3  Temp (24hrs), Avg:97.6 F (36.4 C), Min:97.3 F (36.3 C), Max:98.2 F (36.8 C)  Recent Labs  Lab 03/21/20 1023 03/24/20 1857 03/24/20 2059 03/25/20 0419 03/26/20 1139 03/27/20 0419  WBC 10.2 15.8*  --  12.3* 11.5* 13.3*  CREATININE 1.51* 1.55*  --  1.32*  1.29* 1.39* 1.32*  LATICACIDVEN  --  1.8 1.3  --   --   --     Estimated Creatinine Clearance: 56.7 mL/min (A) (by C-G formula based on SCr of 1.32 mg/dL (H)).    No Known Allergies   Vancomycin 11/19>11/20 Cefepime 11/19> Flagyl 11/19>> 11/20  11/19 Bcx: ngtd MRSA PCR: neg Sputum Cx: pending   Thank you for allowing pharmacy to be a part of this patient's care.  Ramond Craver 03/27/2020 2:07 PM

## 2020-03-27 NOTE — Consult Note (Signed)
Consultation Note Date: 03/27/2020   Patient Name: Darrell Christensen  DOB: 02-15-1946  MRN: [id number]  Age / Sex: 74 y.o., male  PCP: Celene Squibb, MD Referring Physician: Rodena Goldmann, DO  Reason for Consultation: Establishing goals of care and Psychosocial/spiritual support  HPI/Patient Profile: 74 y.o. male  with past medical history of stage 4 lung adenocarcinoma- brain mets, Dr. Raliegh Ip, CKD, HTN/HLD, DM  admitted on 03/24/2020 with acute on chronic respiratory failure, improving.   Clinical Assessment and Goals of Care: Darrell Christensen is sitting up in the bed.  He greets me making and somewhat keeping eye contact.  He is alert and oriented x3, but a little confused at times.  He is able to make his needs known.  There is no family at bedside at this time.  I asked how he is doing and he tells me, "failure".  He tells me that he has a good appetite.  We talk about his acute and chronic health concerns, what has brought him to the hospital.  We talked about his breathing issues, his weakness.  He tells me that at this point he is agreeable to short-term rehab, feeling this would benefit him.  We also talked about his cancer treatment.  He scoffs and states that he is supposed to be having cancer treatment but he is unsure why he is not receiving treatment at this time.  Conference with care team suggest that side effects will relate his treatments.  We talked about healthcare power of attorney, see below. We talked about CODE STATUS, see below.  At this point continue to treat the treatable but no CPR or intubation.  Agreeable to short-term rehab, with the ultimate goal of returning to his home.  Continue cancer treatments as offered.  Conference with attending, bedside nursing staff, transition of care team related to patient condition, needs, goals of care. PMT to continue to follow  HCPOA   NEXT OF KIN -Darrell Christensen  names his daughter, Darrell Christensen as his healthcare surrogate.  He shares that Darrell Christensen lives in [place], but his granddaughter Darrell Christensen, Darrell Christensen's daughter lives locally in [place].  Darrell Christensen tells me he had a son who was killed in 33.    SUMMARY OF RECOMMENDATIONS   At this point continue to treat the treatable but no CPR or intubation Agreeable to continue cancer treatments as offered Agreeable to short-term rehab with ultimate goal to return home   Code Status/Advance Care Planning:  DNR  Symptom Management:   Per hospitalist, no additional needs at this time.  Palliative Prophylaxis:   Frequent Pain Assessment and Oral Care  Additional Recommendations (Limitations, Scope, Preferences):  Treat the treatable but no CPR or intubation, agreeable to continue chemotherapy as offered  Psycho-social/Spiritual:   Desire for further Chaplaincy support:no  Additional Recommendations: Caregiving  Support/Resources and Education on Hospice  Prognosis:   Unable to determine, based on outcomes and cancer progression.  Discharge Planning: Agreeable to short-term rehab with the ultimate goal of returning home.  Primary Diagnoses: Present on Admission: . Acute respiratory failure with hypoxia ([place]) . Primary cancer of right upper lobe of lung ([place]) . HTN (hypertension), benign . Brain metastasis ([place]) . Acute encephalopathy   I have reviewed the medical record, interviewed the patient and family, and examined the patient. The following aspects are pertinent.  Past Medical History:  Diagnosis Date  . Adrenal mass, left ([place]) 04/01/2016  . Diabetes mellitus ([place]) 05/02/2016  . Diabetes mellitus without complication ([place])   . Hypertension   . Mass of breast, right 04/01/2016  . Mass of upper lobe of right lung 04/01/2016   2 adjacent, 5 cm masses, hilar adenopathy  . Primary cancer of right upper lobe of lung ([place]) 04/01/2016   2 adjacent, 5 cm masses, hilar adenopathy  .  Pulmonary embolus ([place]) 03/31/2016  . 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 Marital status: Divorced    Spouse name: Not on file  . Number of children: Not on file  . Years of education: Not on file  . Highest education level: Not on file  Occupational History  . Not on file  Tobacco Use  . Smoking status: Former Smoker    Years: 61.00    Quit date: 04/11/2014    Years since quitting: 5.9  . Smokeless tobacco: Never Used  . Tobacco comment: patient does vape smoking x 2 years  Vaping Use  . Vaping Use: Every day  Substance and Sexual Activity  . Alcohol use: Not Currently    Comment: quit 10 years  . Drug use: No  . Sexual activity: Not on file  Other Topics Concern  . Not on file  Social History Narrative  . Not on file   Social Determinants of Health   Financial Resource Strain: High Risk  . Difficulty of Paying Living Expenses: Very hard  Food Insecurity: No Food Insecurity  . Worried About Charity fundraiser in the Last Year: Never true  . Ran Out of Food in the Last Year: Never true  Transportation Needs: No Transportation Needs  . Lack of Transportation (Medical): No  . Lack of Transportation (Non-Medical): No  Physical Activity: Inactive  . Days of Exercise per Week: 0 days  . Minutes of Exercise per Session: 0 min  Stress: No Stress Concern Present  . Feeling of Stress : Not at all  Social Connections: Socially Isolated  . Frequency of Communication with Friends and Family: Never  . Frequency of Social Gatherings with Friends and Family: Never  . Attends Religious Services: Never  . Active Member of Clubs or Organizations: No  . Attends Archivist Meetings: Never  . Marital Status: Divorced   Family History  Problem Relation Age of Onset  . Stroke Mother   . Heart attack Father   . Heart attack Brother    Scheduled Meds: . Chlorhexidine Gluconate Cloth  6 each Topical Daily  . feeding supplement  (GLUCERNA SHAKE)  237 mL Oral TID BM  . furosemide  40 mg Intravenous Q12H  . heparin  5,000 Units Subcutaneous Q8H  . insulin aspart  0-5 Units Subcutaneous QHS  . insulin aspart  0-9 Units Subcutaneous TID WC  . metoprolol succinate  25 mg Oral BID  . predniSONE  50 mg Oral Q breakfast  . simvastatin  20 mg Oral QPM   Continuous Infusions: . ceFEPime (MAXIPIME) IV Stopped (03/27/20 1059)   PRN Meds:.ALPRAZolam, haloperidol lactate, labetalol, levalbuterol  Medications Prior to Admission:  Prior to Admission medications   Medication Sig Start Date End Date Taking? Authorizing Provider  ALPRAZolam [name]) 1 MG tablet Take 1 mg by mouth 2 (two) times daily as needed for anxiety.   Yes [provider]  amLODipine (NORVASC) 2.5 MG tablet Take 2.5 mg by mouth daily. 03/22/16  Yes [provider]  diphenoxylate-atropine (LOMOTIL) 2.5-0.025 MG tablet Take 2 tablets 4 times per day. 03/08/20  Yes Derek Jack, MD  DULoxetine (CYMBALTA) 60 MG capsule Take 1 capsule (60 mg total) by mouth daily. 01/26/20  Yes Derek Jack, MD  famotidine (PEPCID) 20 MG tablet Take 1 tablet (20 mg total) by mouth daily. 01/13/20  Yes Derek Jack, MD  folic acid (FOLVITE) 1 MG tablet Take 1 tablet (1 mg total) by mouth daily. 03/18/20  Yes Johnson, Clanford L, MD  GLIPIZIDE XL 5 MG 24 hr tablet Take 5 mg by mouth daily.  03/22/16  Yes [provider]  hydrocortisone 2.5 % lotion Apply topically as needed.  02/13/19  Yes [provider]  lidocaine-prilocaine (EMLA) cream APPLY A QUARTER SIZE AMOUNT TO THE AFFECTED AREA 1 HOUR PRIOR TO COMING TO CHEMOTHERAPY. COVER WITH A PLASTIC WRAP. 01/07/19  Yes [place], Randi L, NP-C  magnesium oxide (MAG-OX) 400 (241.3 Mg) MG tablet Take 1 tablet (400 mg total) by mouth 2 (two) times daily. 04/14/19  Yes Derek Jack, MD  metFORMIN (GLUCOPHAGE) 500 MG tablet Take 500 mg by mouth 2 (two) times daily. 03/07/16  Yes  [provider]  metoprolol succinate (TOPROL-XL) 25 MG 24 hr tablet TAKE ONE TABLET BY MOUTH IN THE EVENING. 03/26/17  Yes Holley Bouche, NP  oxyCODONE-acetaminophen (PERCOCET) 10-325 MG tablet Take 1 tablet by mouth as needed.  06/18/17  Yes [provider]  Pembrolizumab (KEYTRUDA IV) Inject into the vein. 12/16/18  Yes [provider]  potassium chloride (K-DUR,KLOR-CON) 10 MEQ tablet Take 10 mEq by mouth daily.  01/16/18  Yes [provider]  predniSONE (DELTASONE) 20 MG tablet Take 2.5 tablets (50 mg total) by mouth daily with breakfast. 01/20/20  Yes Derek Jack, MD  prochlorperazine (COMPAZINE) 10 MG tablet Take 1 tablet (10 mg total) by mouth every 6 (six) hours as needed (Nausea or vomiting). 10/15/18  Yes Derek Jack, MD  simvastatin (ZOCOR) 20 MG tablet Take 20 mg by mouth every evening. 03/07/16  Yes [provider]  torsemide (DEMADEX) 20 MG tablet Take 1 tablet (20 mg total) by mouth daily. 01/27/19  Yes Derek Jack, MD  lactulose (CHRONULAC) 10 GM/15ML solution Take 30 mLs (20 g total) by mouth at bedtime as needed for mild constipation. 08/06/19   Derek Jack, MD  ondansetron (ZOFRAN) 4 MG tablet Take 1 tablet (4 mg total) by mouth every 8 (eight) hours as needed for nausea or vomiting. 11/03/18   Triplett, Tammy, PA-C   No Known Allergies Review of Systems  Unable to perform ROS: Other    Physical Exam Vitals and nursing note reviewed.  Constitutional:      General: He is not in acute distress.    Appearance: He is obese. He is ill-appearing.  HENT:     Head: Normocephalic and atraumatic.     Mouth/Throat:     Mouth: Mucous membranes are moist.  Cardiovascular:     Rate and Rhythm: Normal rate.  Pulmonary:     Effort: Pulmonary effort is normal. No respiratory distress.  Abdominal:     Palpations: Abdomen is soft.  Tenderness: There is no guarding.  Skin:    General: Skin is warm and  dry.  Neurological:     Mental Status: He is alert and oriented to person, place, and time. Mental status is at baseline.  Psychiatric:        Behavior: Behavior normal.     Vital Signs: BP (!) 142/82   Pulse 68   Temp (!) 97.3 F (36.3 C) (Oral)   Resp (!) 27   Ht 5\' 11"  (1.803 m)   Wt 94.2 kg   SpO2 96%   BMI 28.96 kg/m  Pain Scale: 0-10 POSS *See Group Information*: 1-Acceptable,Awake and alert Pain Score: 0-No pain   SpO2: SpO2: 96 % O2 Device:SpO2: 96 % O2 Flow Rate: .O2 Flow Rate (L/min): 5 L/min  IO: Intake/output summary:   Intake/Output Summary (Last 24 hours) at 03/27/2020 1545 Last data filed at 03/27/2020 1500 Gross per 24 hour  Intake 1205.79 ml  Output 2075 ml  Net -869.21 ml    LBM:   Baseline Weight: Weight: 124.7 kg Most recent weight: Weight: 94.2 kg     Palliative Assessment/Data:   Flowsheet Rows     Most Recent Value  Intake Tab  Referral Department Hospitalist  Unit at Time of Referral Intermediate Care Unit  Palliative Care Primary Diagnosis Pulmonary  Date Notified 03/27/20  Palliative Care Type New Palliative care  Reason for referral Clarify Goals of Care  Date of Admission 03/24/20  Date first seen by Palliative Care 03/27/20  # of days Palliative referral response time 0 Day(s)  # of days IP prior to Palliative referral 3  Clinical Assessment  Palliative Performance Scale Score 40%  Pain Max last 24 hours Not able to report  Pain Min Last 24 hours Not able to report  Dyspnea Max Last 24 Hours Not able to report  Dyspnea Min Last 24 hours Not able to report  Psychosocial & Spiritual Assessment  Palliative Care Outcomes      Time In: 1410 Time Out: 1440 Time Total: 30 minutes  Greater than 50%  of this time was spent counseling and coordinating care related to the above assessment and plan.  Signed by: Drue Novel, NP   Please contact Palliative Medicine Team phone at [phone] for questions and concerns.  For  individual provider: See Shea Evans

## 2020-03-27 NOTE — [place] FL2 (Signed)
[place] MEDICAID FL2 LEVEL OF CARE SCREENING TOOL     IDENTIFICATION  Patient Name: Darrell Christensen Birthdate: 02-24-1946 Sex: male Admission Date (Current Location): 03/24/2020  [facility] and [place] Number:  [employer] and Address:  [facility] [address] [place]      Provider Number: [id number]  Attending Physician Name and Address:  Rodena Goldmann, DO  Relative Name and Phone Number:  Penelope Coop - grand daughter [phone]    Current Level of Care: Hospital Recommended Level of Care: [place] Prior Approval Number:    Date Approved/Denied:   PASRR Number: [id number]  Discharge Plan: SNF    Current Diagnoses: Patient Active Problem List   Diagnosis Date Noted  . Acute respiratory failure with hypoxia ([place]) 03/25/2020  . Generalized weakness 03/25/2020  . Fall at home, initial encounter 03/25/2020  . Leukocytosis 03/25/2020  . Hypokalemia 03/25/2020  . Hypoalbuminemia 03/25/2020  . Pulmonary edema 03/25/2020  . Healthcare-associated pneumonia 03/25/2020  . Diabetic neuropathy ([place]) 03/25/2020  . Acute encephalopathy 03/17/2020  . Dehydration 04/14/2019  . Brain metastasis ([place]) 11/27/2018  . Goals of care, counseling/discussion 10/15/2018  . Abnormal CT of the abdomen 07/22/2017  . Itching 10/10/2016  . Diabetes mellitus ([place]) 05/02/2016  . 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[place])   . Primary cancer of right upper lobe of lung ([place]) 04/01/2016  . Breast nodule 04/01/2016  . Adrenal mass, left ([place]) 04/01/2016  . HTN (hypertension), benign 04/01/2016  . Pulmonary embolus ([place]) 03/31/2016    Orientation RESPIRATION BLADDER Height & Weight     Self, Time, Situation, Place  O2 (5 L) Continent Weight: 94.2 kg Height:  5\' 11"  (180.3 cm)  BEHAVIORAL SYMPTOMS/MOOD NEUROLOGICAL BOWEL NUTRITION STATUS      Continent Diet (See DC summary)  AMBULATORY STATUS COMMUNICATION OF NEEDS Skin   Extensive Assist  Verbally Other (Comment) (Blister on buttock)                       Personal Care Assistance Level of Assistance  Bathing, Feeding, Dressing Bathing Assistance: Maximum assistance Feeding assistance: Independent Dressing Assistance: Limited assistance     Functional Limitations Info  Sight, Hearing, Speech Sight Info: Adequate Hearing Info: Adequate Speech Info: Adequate    SPECIAL CARE FACTORS FREQUENCY  PT (By licensed PT)     PT Frequency: 5 times a week              Contractures Contractures Info: Not present    Additional Factors Info  Code Status, Allergies Code Status Info: DNR Allergies Info: NKDA           Current Medications (03/27/2020):  This is the current hospital active medication list Current Facility-Administered Medications  Medication Dose Route Frequency Provider Last Rate Last Admin  . ALPRAZolam [name]) tablet 1 mg  1 mg Oral BID PRN Manuella Ghazi, Pratik D, DO      . ceFEPIme (MAXIPIME) 2 g in sodium chloride 0.9 % 100 mL IVPB  2 g Intravenous Q12H Fredia Sorrow, MD 200 mL/hr at 03/27/20 1027 2 g at 03/27/20 1027  . Chlorhexidine Gluconate Cloth 2 % PADS 6 each  6 each Topical Daily Manuella Ghazi, Pratik D, DO   6 each at 03/27/20 1019  . feeding supplement (GLUCERNA SHAKE) (GLUCERNA SHAKE) liquid 237 mL  237 mL Oral TID BM Adefeso, Oladapo, DO   237 mL at 03/27/20 1015  . furosemide (LASIX) injection 40 mg  40 mg  Intravenous Q12H Manuella Ghazi, Pratik D, DO   40 mg at 03/27/20 2585  . haloperidol lactate (HALDOL) injection 1 mg  1 mg Intravenous Q6H PRN Manuella Ghazi, Pratik D, DO      . heparin injection 5,000 Units  5,000 Units Subcutaneous Q8H Adefeso, Oladapo, DO   5,000 Units at 03/27/20 0513  . insulin aspart (novoLOG) injection 0-5 Units  0-5 Units Subcutaneous QHS Adefeso, Oladapo, DO   2 Units at 03/26/20 2123  . insulin aspart (novoLOG) injection 0-9 Units  0-9 Units Subcutaneous TID WC Adefeso, Oladapo, DO   2 Units at 03/27/20 1120  . labetalol (NORMODYNE)  injection 10 mg  10 mg Intravenous Q2H PRN Manuella Ghazi, Pratik D, DO      . levalbuterol (XOPENEX) nebulizer solution 0.63 mg  0.63 mg Nebulization Q6H PRN Manuella Ghazi, Pratik D, DO      . metoprolol succinate (TOPROL-XL) 24 hr tablet 25 mg  25 mg Oral BID Bernerd Pho M, PA-C   25 mg at 03/27/20 1015  . potassium chloride 10 mEq in 100 mL IVPB  10 mEq Intravenous Q1 Hr x 4 Shah, Pratik D, DO 100 mL/hr at 03/27/20 1123 10 mEq at 03/27/20 1123  . predniSONE (DELTASONE) tablet 50 mg  50 mg Oral Q breakfast Heath Lark D, DO   50 mg at 03/27/20 0756  . simvastatin (ZOCOR) tablet 20 mg  20 mg Oral QPM Manuella Ghazi, Pratik D, DO   20 mg at 03/26/20 1649   Facility-Administered Medications Ordered in Other Encounters  Medication Dose Route Frequency Provider Last Rate Last Admin  . 0.9 %  sodium chloride infusion   Intravenous Once Derek Jack, MD      . 0.9 %  sodium chloride infusion   Intravenous Once PRN Derek Jack, MD      . albuterol (PROVENTIL) (2.5 MG/3ML) 0.083% nebulizer solution 2.5 mg  2.5 mg Nebulization Once PRN Derek Jack, MD      . EPINEPHrine (ADRENALIN) 1 MG/10ML injection 0.25 mg  0.25 mg Intravenous Once PRN Derek Jack, MD      . EPINEPHrine (ADRENALIN) 1 MG/10ML injection 0.25 mg  0.25 mg Intravenous Once PRN Derek Jack, MD         Discharge Medications: Please see discharge summary for a list of discharge medications.  Relevant Imaging Results:  Relevant Lab Results:   Additional Information SS [id number]  Boneta Lucks, RN

## 2020-03-27 NOTE — Evaluation (Signed)
Physical Therapy Evaluation Patient Details Name: DELBERT Christensen MRN: [id number] DOB: 26-Feb-1946 Today's Date: 03/27/2020   History of Present Illness  Darrell Christensen is a 74 y.o. male with medical history significant for stage IV lung adenocarcinoma, followed by Dr. Delton Coombes with known brain mets, CKD, hypertension, hyperlipidemia, diabetes mellitus, hypertension who presents to the emergency department due to a fall sustained at home and he was too weak to get up.  Patient was unable to provide history of why he came to the ED, his only complaint was bilateral leg swelling.  History was obtained from ED physician and from ED medical record.  Per report, Family member checked on him and found him on the floor (he lives by himself), so he was brought to the ED with concern the patient is unable to live by himself and that he might need a facility care.  1 tablet of Xanax (dose unknown) was given prior to bringing the patient to the ED.  There was no complaint of fever, chills, chest pain, nausea vomiting abdominal pain    Clinical Impression  Patient demonstrates slow labored movement for sitting up at bedside, very unsteady on feet requiring use of RW due to fall risk, limited to ambulating at bedside due to generalized weakness and difficulty breathing with SpO2 dropping from 90% to 85% while on 5 LPM - RN aware.  Patient tolerated sitting up in chair after therapy. Patient will benefit from continued physical therapy in hospital and recommended venue below to increase strength, balance, endurance for safe ADLs and gait.      Follow Up Recommendations SNF    Equipment Recommendations  None recommended by PT    Recommendations for Other Services       Precautions / Restrictions Precautions Precautions: Fall Restrictions Weight Bearing Restrictions: No      Mobility  Bed Mobility Overal bed mobility: Needs Assistance Bed Mobility: Supine to Sit     Supine to sit: Min assist;Mod  assist     General bed mobility comments: slow labored movement, had difficulty attempting to prop up on elbows due to weakness    Transfers Overall transfer level: Needs assistance Equipment used: Rolling walker (2 wheeled) Transfers: Stand Pivot Transfers;Sit to/from Stand Sit to Stand: Min assist Stand pivot transfers: Min assist       General transfer comment: slow labored movement, difficulty transferring to commode due to weakness  Ambulation/Gait Ambulation/Gait assistance: Min assist Gait Distance (Feet): 10 Feet Assistive device: Rolling walker (2 wheeled) Gait Pattern/deviations: Decreased step length - right;Decreased step length - left;Decreased stride length Gait velocity: decreased   General Gait Details: demonstrates slow labored cadence, had to use RW due to BLE weakness, limited due to fatigue and SpO2 dropping from 90% to 85% while on 5 LPM O2  Stairs            Wheelchair Mobility    Modified Rankin (Stroke Patients Only)       Balance Overall balance assessment: Needs assistance Sitting-balance support: Feet supported;No upper extremity supported Sitting balance-Leahy Scale: Good Sitting balance - Comments: seated at EOB   Standing balance support: During functional activity;No upper extremity supported Standing balance-Leahy Scale: Poor Standing balance comment: fair using RW                             Pertinent Vitals/Pain Pain Assessment: No/denies pain    Home Living Family/patient expects to be discharged to:: Private residence  Living Arrangements: Alone Available Help at Discharge: Family;Available PRN/intermittently Type of Home: Mobile home Home Access: Stairs to enter Entrance Stairs-Rails: Can reach both;Left;Right Entrance Stairs-Number of Steps: 3 Home Layout: One level Home Equipment: Walker - 2 wheels;Cane - single point;Shower seat      Prior Function Level of Independence: Independent   Gait /  Transfers Assistance 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, drives  ADL's / Homemaking Assistance Needed: independent with basic ADL        Hand Dominance   Dominant Hand: Right    Extremity/Trunk Assessment   Upper Extremity Assessment Upper Extremity Assessment: Defer to OT evaluation    Lower Extremity Assessment Lower Extremity Assessment: Generalized weakness    Cervical / Trunk Assessment Cervical / Trunk Assessment: Normal  Communication   Communication: No difficulties  Cognition Arousal/Alertness: Awake/alert Behavior During Therapy: WFL for tasks assessed/performed Overall Cognitive Status: Within Functional Limits for tasks assessed                                        General Comments      Exercises     Assessment/Plan    PT Assessment Patient needs continued PT services  PT Problem List Decreased strength;Decreased activity tolerance;Decreased balance;Cardiopulmonary status limiting activity;Decreased mobility       PT Treatment Interventions Balance training;DME instruction;Gait training;Stair training;Functional mobility training;Therapeutic activities;Patient/family education    PT Goals (Current goals can be found in the Care Plan section)  Acute Rehab PT Goals Patient Stated Goal: return home PT Goal Formulation: With patient Time For Goal Achievement: 04/10/20 Potential to Achieve Goals: Good    Frequency Min 3X/week   Barriers to discharge        Co-evaluation               AM-PAC PT "6 Clicks" Mobility  Outcome Measure Help needed turning from your back to your side while in a flat bed without using bedrails?: A Little Help needed moving from lying on your back to sitting on the side of a flat bed without using bedrails?: A Lot Help needed moving to and from a bed to a chair (including a wheelchair)?: A Little Help needed standing up from a chair using your arms (e.g., wheelchair or  bedside chair)?: A Little Help needed to walk in hospital room?: A Little Help needed climbing 3-5 steps with a railing? : A Lot 6 Click Score: 16    End of Session Equipment Utilized During Treatment: Oxygen Activity Tolerance: Patient tolerated treatment well;Patient limited by fatigue Patient left: in chair;with call bell/phone within reach Nurse Communication: Mobility status PT Visit Diagnosis: Unsteadiness on feet (R26.81);Other abnormalities of gait and mobility (R26.89);Muscle weakness (generalized) (M62.81)    Time: 4696-2952 PT Time Calculation (min) (ACUTE ONLY): 33 min   Charges:   PT Evaluation $PT Eval Moderate Complexity: 1 Mod PT Treatments $Therapeutic Activity: 23-37 mins        10:14 AM, 03/27/20 Lonell Grandchild, MPT Physical Therapist with [facility] 336 [phone] office [phone] mobile phone

## 2020-03-27 NOTE — TOC Initial Note (Signed)
Transition of Care [facility]) - Initial/Assessment Note    Patient Details  Name: Darrell Christensen MRN: [id number] Date of Birth: [birth year]  Transition of [facility]) CM/SW Contact:    Boneta Lucks, RN Phone Number: 03/27/2020, 12:14 PM  Clinical Narrative:    Patient admitted with acute respiratory failure. Patient has a high readmission score. Cancer patient lives alone. His daughter lives out of town, his grand-daughter is his support. He does not want to call her at this time, states she works and busy with her children. PT is recommending SNF. Patient is agreeable, but unsure where to go.  TOC discussed referring out to local facilities and will review bed offers when given. Patient is agreeable.                Expected Discharge Plan: Acute to Acute Transfer Barriers to Discharge: Continued Medical Work up   Patient Goals and CMS Choice Patient states their goals for this hospitalization and ongoing recovery are:: to go to SNF then home. CMS Medicare.gov Compare Post Acute Care list provided to:: Patient Choice offered to / list presented to : Patient  Expected Discharge Plan and Services Expected Discharge Plan: Acute to Acute Transfer    Living arrangements for the past 2 months: Single Family Home     Prior Living Arrangements/Services Living arrangements for the past 2 months: Single Family Home Lives with:: Self          Need for Family Participation in Patient Care: Yes (Comment) Care giver support system in place?: Yes (comment)   Criminal Activity/Legal Involvement Pertinent to Current Situation/Hospitalization: No - Comment as needed  Activities of Daily Living Home Assistive Devices/Equipment: None ADL Screening (condition at time of admission) Patient's cognitive ability adequate to safely complete daily activities?: Yes Is the patient deaf or have difficulty hearing?: No Does the patient have difficulty seeing, even when wearing glasses/contacts?: No Does  the patient have difficulty concentrating, remembering, or making decisions?: No Patient able to express need for assistance with ADLs?: Yes Does the patient have difficulty dressing or bathing?: No Independently performs ADLs?: Yes (appropriate for developmental age) Does the patient have difficulty walking or climbing stairs?: No Weakness of Legs: None Weakness of Arms/Hands: None  Permission Sought/Granted   Emotional Assessment     Affect (typically observed): Accepting, Pleasant Orientation: : Oriented to Self, Oriented to Place, Oriented to  Time, Oriented to Situation   Psych Involvement: No (comment)  Admission diagnosis:  Hypokalemia [E87.6] Dyspnea [R06.00] Foot swelling [M79.89] Hypoxia [R09.02] SIRS (systemic inflammatory response syndrome) (HCC) [R65.10] Acute respiratory failure with hypoxia ([place]) [J96.01] Fall, initial encounter [W19.XXXA] Hypotension, unspecified hypotension type [I95.9] Altered mental status, unspecified altered mental status type [R41.82] Community acquired pneumonia, unspecified laterality [J18.9] Lung cancer metastatic to brain ([place]) [C34.90, C79.31] Patient Active Problem List   Diagnosis Date Noted  . Acute respiratory failure with hypoxia ([place]) 03/25/2020  . Generalized weakness 03/25/2020  . Fall at home, initial encounter 03/25/2020  . Leukocytosis 03/25/2020  . Hypokalemia 03/25/2020  . Hypoalbuminemia 03/25/2020  . Pulmonary edema 03/25/2020  . Healthcare-associated pneumonia 03/25/2020  . Diabetic neuropathy ([place]) 03/25/2020  . Acute encephalopathy 03/17/2020  . Dehydration 04/14/2019  . Brain metastasis ([place]) 11/27/2018  . Goals of care, counseling/discussion 10/15/2018  . Abnormal CT of the abdomen 07/22/2017  . Itching 10/10/2016  . Diabetes mellitus ([place]) 05/02/2016  . 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 Primary cancer of right upper lobe of lung ([place]) 04/01/2016  . Breast  nodule 04/01/2016  . Adrenal mass, left ([place])  04/01/2016  . HTN (hypertension), benign 04/01/2016  . Pulmonary embolus ([place]) 03/31/2016   PCP:  Celene Squibb, MD Pharmacy:   [facility], [place] [address] [place] [address] Phone: [phone] Fax: [phone]    Readmission Risk Interventions Readmission Risk Prevention Plan 03/27/2020  Transportation Screening Complete  HRI or Home Care Consult Complete  Social Work Consult for [place] Planning/Counseling Complete  Palliative Care Screening Not Applicable  Medication Review Press photographer) Complete  Some recent data might be hidden

## 2020-03-27 NOTE — Plan of Care (Signed)
  Problem: Acute Rehab OT Goals (only OT should resolve) Goal: Pt. Will Perform Upper Body Bathing Flowsheets (Taken 03/27/2020 1105) Pt Will Perform Upper Body Bathing:  with modified independence  sitting Goal: Pt. Will Perform Lower Body Bathing Flowsheets (Taken 03/27/2020 1105) Pt Will Perform Lower Body Bathing:  with modified independence  sitting/lateral leans  sit to/from stand Goal: Pt. Will Perform Upper Body Dressing Flowsheets (Taken 03/27/2020 1105) Pt Will Perform Upper Body Dressing:  with modified independence  sitting Goal: Pt. Will Perform Lower Body Dressing Flowsheets (Taken 03/27/2020 1105) Pt Will Perform Lower Body Dressing:  with modified independence  sitting/lateral leans  sit to/from stand Goal: Pt. Will Transfer To Toilet Flowsheets (Taken 03/27/2020 1105) Pt Will Transfer to Toilet:  with modified independence  ambulating  regular height toilet Goal: Pt. Will Perform Toileting-Clothing Manipulation Flowsheets (Taken 03/27/2020 1105) Pt Will Perform Toileting - Clothing Manipulation and hygiene:  with modified independence  sitting/lateral leans  sit to/from stand

## 2020-03-27 NOTE — Consult Note (Addendum)
Cardiology Consult    Patient ID: Darrell Christensen; [id number]; 1945/06/07   Admit date: 03/24/2020 Date of Consult: 03/27/2020  Primary Care Provider: Celene Squibb, MD Primary Cardiologist: New to [facility] - Dr. Harrington Challenger  Patient Profile    Darrell Christensen is a 74 y.o. male with past medical history of Stage 4 lung cancer with brain mets, HTN, HLD, Type 2 DM and Stage 3 CKD who is being seen today for the evaluation of new cardiomyopathy at the request of Dr. Manuella Ghazi.   History of Present Illness    Darrell Christensen was admitted from 11/12 - 03/18/2020 for evaluation of worsening weakness and altered mental status in the setting of recent immunotherapy-related colitis. Was found to be dehydrated and treated with IVF. Appears he was continued on Torsemide 20mg  daily at discharge and weight was at 220 lbs.   He presented back to [facility] ED on 03/24/2020 for progressive weakness and frequent falls. Was also found to be hypoxic. Initial labs showed WBC 15.8, Hgb 11.7, platelets 238, NA+ 137, K+ 2.8, creatinine 1.55 and albumin 2.7. BNP 454. Lactic Acid 1.8. Blood cultures negative. CXR showed bibasilar atelectasis or infiltrates. CT Head showed generalized cerebral atrophy but no acute abnormalities. CTA showed no evidence of a PE but was consistent with multifocal infiltrates and pulmonary edema. Did have aortic atherosclerosis and coronary artery calcifications. EKG showed NSR, HR 81 with PVC's and TWI along the inferior leads.  He was admitted for further management of his acute hypoxic respiratory failure and started on antibiotic therapy for possible HCAP/Aspiration PNA. Initially received IVF for possible sepsis but this was later discontinued due to his elevated BNP. An echocardiogram was obtained on 11/20 and showed a reduced EF of 30 to 35% with global HK, moderate LVH, Grade 1 DD, trivial pericardial effusion and trivial AI.   He was initially receiving IV Lasix 40mg  and this was titrated to 40mg  BID  today by the admitting team. Recorded positive I&O's of + 9.7L this admission and weight at 207 lbs. Repeat labs this AM show creatinine is stable at 1.32 but K+ is low at 3.2.  In talking with the patient today, he reports still having dyspnea and lower extremity edema. Unable to recall the details about why he came to the ED initially. Says he has been struggling with diarrhea for 14+ weeks and this has led to his current medical issues. He denies any recent chest pain or palpitations. Unaware of any prior cardiac history including CAD, CHF or arrhythmias.    Past Medical History:  Diagnosis Date  . Adrenal mass, left ([place]) 04/01/2016  . Diabetes mellitus ([place]) 05/02/2016  . Diabetes mellitus without complication ([place])   . Hypertension   . Mass of breast, right 04/01/2016  . Mass of upper lobe of right lung 04/01/2016   2 adjacent, 5 cm masses, hilar adenopathy  . Primary cancer of right upper lobe of lung ([place]) 04/01/2016   2 adjacent, 5 cm masses, hilar adenopathy  . Pulmonary embolus ([place]) 03/31/2016  . 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 COLONOSCOPY N/A 07/28/2017   Procedure: COLONOSCOPY;  Surgeon: Rogene Houston, MD;  Location: [facility] ENDO SUITE;  Service: Endoscopy;  Laterality: N/A;  205  . PORTACATH PLACEMENT Left 05/13/2016   Procedure: INSERTION PORT-A-CATH;  Surgeon: Aviva Signs, MD;  Location: [facility] ORS;  Service: General;  Laterality: Left;     Home Medications:  Prior to Admission medications   Medication Sig Start Date End Date Taking? Authorizing Provider  ALPRAZolam [name]) 1 MG tablet Take 1 mg by mouth 2 (two) times daily as needed for anxiety.   Yes [provider]  amLODipine (NORVASC) 2.5 MG tablet Take 2.5 mg by mouth daily. 03/22/16  Yes [provider]  diphenoxylate-atropine (LOMOTIL) 2.5-0.025 MG tablet Take 2 tablets 4 times per day. 03/08/20  Yes Derek Jack, MD  DULoxetine (CYMBALTA)  60 MG capsule Take 1 capsule (60 mg total) by mouth daily. 01/26/20  Yes Derek Jack, MD  famotidine (PEPCID) 20 MG tablet Take 1 tablet (20 mg total) by mouth daily. 01/13/20  Yes Derek Jack, MD  folic acid (FOLVITE) 1 MG tablet Take 1 tablet (1 mg total) by mouth daily. 03/18/20  Yes Johnson, Clanford L, MD  GLIPIZIDE XL 5 MG 24 hr tablet Take 5 mg by mouth daily.  03/22/16  Yes [provider]  hydrocortisone 2.5 % lotion Apply topically as needed.  02/13/19  Yes [provider]  lidocaine-prilocaine (EMLA) cream APPLY A QUARTER SIZE AMOUNT TO THE AFFECTED AREA 1 HOUR PRIOR TO COMING TO CHEMOTHERAPY. COVER WITH A PLASTIC WRAP. 01/07/19  Yes Lockamy, Randi L, NP-C  magnesium oxide (MAG-OX) 400 (241.3 Mg) MG tablet Take 1 tablet (400 mg total) by mouth 2 (two) times daily. 04/14/19  Yes Derek Jack, MD  metFORMIN (GLUCOPHAGE) 500 MG tablet Take 500 mg by mouth 2 (two) times daily. 03/07/16  Yes [provider]  metoprolol succinate (TOPROL-XL) 25 MG 24 hr tablet TAKE ONE TABLET BY MOUTH IN THE EVENING. 03/26/17  Yes Holley Bouche, NP  oxyCODONE-acetaminophen (PERCOCET) 10-325 MG tablet Take 1 tablet by mouth as needed.  06/18/17  Yes [provider]  Pembrolizumab (KEYTRUDA IV) Inject into the vein. 12/16/18  Yes [provider]  potassium chloride (K-DUR,KLOR-CON) 10 MEQ tablet Take 10 mEq by mouth daily.  01/16/18  Yes [provider]  predniSONE (DELTASONE) 20 MG tablet Take 2.5 tablets (50 mg total) by mouth daily with breakfast. 01/20/20  Yes Derek Jack, MD  prochlorperazine (COMPAZINE) 10 MG tablet Take 1 tablet (10 mg total) by mouth every 6 (six) hours as needed (Nausea or vomiting). 10/15/18  Yes Derek Jack, MD  simvastatin (ZOCOR) 20 MG tablet Take 20 mg by mouth every evening. 03/07/16  Yes [provider]  torsemide (DEMADEX) 20 MG tablet Take 1 tablet (20 mg total) by mouth daily.  01/27/19  Yes Derek Jack, MD  lactulose (CHRONULAC) 10 GM/15ML solution Take 30 mLs (20 g total) by mouth at bedtime as needed for mild constipation. 08/06/19   Derek Jack, MD  ondansetron (ZOFRAN) 4 MG tablet Take 1 tablet (4 mg total) by mouth every 8 (eight) hours as needed for nausea or vomiting. 11/03/18   Kem Parkinson, PA-C    Inpatient Medications: Scheduled Meds: . Chlorhexidine Gluconate Cloth  6 each Topical Daily  . feeding supplement (GLUCERNA SHAKE)  237 mL Oral TID BM  . furosemide  40 mg Intravenous Q12H  . heparin  5,000 Units Subcutaneous Q8H  . insulin aspart  0-5 Units Subcutaneous QHS  . insulin aspart  0-9 Units Subcutaneous TID WC  . metoprolol succinate  25 mg Oral BID  . predniSONE  50 mg Oral Q breakfast  . simvastatin  20 mg Oral QPM   Continuous Infusions: . ceFEPime (MAXIPIME) IV Stopped (03/26/20 2155)  . potassium chloride 10 mEq (03/27/20 0911)   PRN Meds:  ALPRAZolam, haloperidol lactate, labetalol, levalbuterol  Allergies:   No Known Allergies  Social History:   Social History   Socioeconomic History  . Marital status: Divorced    Spouse name: Not on file  . Number of children: Not on file  . Years of education: Not on file  . Highest education level: Not on file  Occupational History  . Not on file  Tobacco Use  . Smoking status: Former Smoker    Years: 61.00    Quit date: 04/11/2014    Years since quitting: 5.9  . Smokeless tobacco: Never Used  . Tobacco comment: patient does vape smoking x 2 years  Vaping Use  . Vaping Use: Every day  Substance and Sexual Activity  . Alcohol use: Not Currently    Comment: quit 10 years  . Drug use: No  . Sexual activity: Not on file  Other Topics Concern  . Not on file  Social History Narrative  . Not on file   Social Determinants of Health   Financial Resource Strain: High Risk  . Difficulty of Paying Living Expenses: Very hard  Food Insecurity: No Food Insecurity  .  Worried About Charity fundraiser in the Last Year: Never true  . Ran Out of Food in the Last Year: Never true  Transportation Needs: No Transportation Needs  . Lack of Transportation (Medical): No  . Lack of Transportation (Non-Medical): No  Physical Activity: Inactive  . Days of Exercise per Week: 0 days  . Minutes of Exercise per Session: 0 min  Stress: No Stress Concern Present  . Feeling of Stress : Not at all  Social Connections: Socially Isolated  . Frequency of Communication with Friends and Family: Never  . Frequency of Social Gatherings with Friends and Family: Never  . Attends Religious Services: Never  . Active Member of Clubs or Organizations: No  . Attends Archivist Meetings: Never  . Marital Status: Divorced  Human resources officer Violence: Not At Risk  . Fear of Current or Ex-Partner: No  . Emotionally Abused: No  . Physically Abused: No  . Sexually Abused: No     Family History:    Family History  Problem Relation Age of Onset  . Stroke Mother   . Heart attack Father   . Heart attack Brother       Review of Systems    General:  No chills, fever, night sweats or weight changes. Positive for weakness.   Cardiovascular:  No chest pain, palpitations, paroxysmal nocturnal dyspnea. Positive for dyspnea on exertion, edema and orthopnea.  Dermatological: No rash, lesions/masses Respiratory: No cough. Urologic: No hematuria, dysuria Abdominal:   No nausea, vomiting, bright red blood per rectum, melena, or hematemesis. Positive for diarrhea.  Neurologic:  No visual changes.  All other systems reviewed and are otherwise negative except as noted above.  Physical Exam/Data    Vitals:   03/27/20 0600 03/27/20 0700 03/27/20 0800 03/27/20 0900  BP: (!) 154/86 (!) 141/69 (!) 159/96 (!) 147/85  Pulse: 76 71 82 86  Resp: (!) 26 (!) 24 20 (!) 23  Temp:   (!) 97.3 F (36.3 C)   TempSrc:   Oral   SpO2: 91% 93% 94% 91%  Weight:  94.2 kg    Height:         Intake/Output Summary (Last 24 hours) at 03/27/2020 1006 Last data filed at 03/27/2020 0700 Gross per 24 hour  Intake 1070.06 ml  Output 400 ml  Net 670.06  ml   Filed Weights   03/25/20 1856 03/26/20 0500 03/27/20 0700  Weight: 93.5 kg 93.5 kg 94.2 kg   Body mass index is 28.96 kg/m.   General: Pleasant elderly male appearing in NAD Psych: Normal affect. Neuro: Alert and oriented X 3. Moves all extremities spontaneously. HEENT: Normal  Neck: Supple without bruits. JVD at 10 cm Lungs:  Resp regular and unlabored, rales along bases with scattered rhonchi. Heart: RRR no s3, s4, or murmurs. Abdomen: Soft, non-tender, non-distended, BS + x 4.  Extremities: No clubbing or cyanosis. 1+ pitting edema bilaterally. DP/PT/Radials 2+ and equal bilaterally.   EKG:  The EKG was personally reviewed and demonstrates: NSR, HR 81 with PVC's and TWI along the inferior leads.  Telemetry:  Telemetry was personally reviewed and demonstrates: NSR, HR in 70's to 80's. Frequent PVC's and couplets at times. Also brief episodes of ventricular bigeminy.    Labs/Studies     Relevant CV Studies:  Echocardiogram: 03/25/2020 IMPRESSIONS    1. Left ventricular ejection fraction, by estimation, is 30 to 35%. The  left ventricle has moderately decreased function. The left ventricle  demonstrates global hypokinesis. There is moderate left ventricular  hypertrophy. Left ventricular diastolic  parameters are consistent with Grade I diastolic dysfunction (impaired  relaxation).  2. Right ventricular systolic function was not well visualized. The right  ventricular size is normal.  3. The pericardial effusion is circumferential. There is no evidence of  cardiac tamponade.  4. The mitral valve is normal in structure. No evidence of mitral valve  regurgitation. No evidence of mitral stenosis.  5. The aortic valve was not well visualized. Aortic valve regurgitation  is trivial. No aortic stenosis  is present.  6. The inferior vena cava is normal in size with greater than 50%  respiratory variability, suggesting right atrial pressure of 3 mmHg.   Laboratory Data:  Chemistry Recent Labs  Lab 03/25/20 0419 03/26/20 1139 03/27/20 0419  NA 139 134* 136  K 3.0* 3.0* 3.2*  CL 108 98 100  CO2 24 27 25   GLUCOSE 86 192* 92  BUN 14 16 20   CREATININE 1.32*  1.29* 1.39* 1.32*  CALCIUM 6.9* 7.3* 8.2*  GFRNONAA 56*  58* 53* 56*  ANIONGAP 7 9 11     Recent Labs  Lab 03/21/20 1023 03/24/20 1857 03/25/20 0419  PROT 6.2* 5.7* 5.2*  ALBUMIN 3.0* 2.7* 2.3*  AST 16 18 15   ALT 22 22 19   ALKPHOS 50 50 44  BILITOT 0.8 0.9 0.7   Hematology Recent Labs  Lab 03/25/20 0419 03/26/20 1139 03/27/20 0419  WBC 12.3* 11.5* 13.3*  RBC 3.75* 3.77* 4.17*  HGB 11.4* 11.4* 12.3*  HCT 34.9* 34.3* 37.8*  MCV 93.1 91.0 90.6  MCH 30.4 30.2 29.5  MCHC 32.7 33.2 32.5  RDW 16.6* 15.9* 15.4  PLT 233 218 260   Cardiac EnzymesNo results for input(s): TROPONINI in the last 168 hours. No results for input(s): TROPIPOC in the last 168 hours.  BNP Recent Labs  Lab 03/25/20 0419  BNP 454.0*    DDimer No results for input(s): DDIMER in the last 168 hours.  Radiology/Studies:  CT Head Wo Contrast  Result Date: 03/25/2020 CLINICAL DATA:  Altered mental status. EXAM: CT HEAD WITHOUT CONTRAST TECHNIQUE: Contiguous axial images were obtained from the base of the skull through the vertex without intravenous contrast. COMPARISON:  March 17, 2020 FINDINGS: Brain: There is mild cerebral atrophy with widening of the extra-axial spaces and ventricular dilatation. There are areas  of decreased attenuation within the white matter tracts of the supratentorial brain, consistent with microvascular disease changes. Stable focal white matter low attenuation is noted within the posterior aspect of the right parietal lobe. Vascular: No hyperdense vessel or unexpected calcification. Skull: Normal. Negative for  fracture or focal lesion. Sinuses/Orbits: No acute finding. Other: None. IMPRESSION: 1. Generalized cerebral atrophy. 2. No acute intracranial abnormality. 3. Stable findings consistent with a known small right parietal lobe metastatic lesion. Electronically Signed   By: Virgina Norfolk M.D.   On: 03/25/2020 01:16   CT Angio Chest PE W/Cm &/Or Wo Cm  Result Date: 03/25/2020 CLINICAL DATA:  Shortness of breath. EXAM: CT ANGIOGRAPHY CHEST WITH CONTRAST TECHNIQUE: Multidetector CT imaging of the chest was performed using the standard protocol during bolus administration of intravenous contrast. Multiplanar CT image reconstructions and MIPs were obtained to evaluate the vascular anatomy. CONTRAST:  146mL OMNIPAQUE IOHEXOL 350 MG/ML SOLN COMPARISON:  March 07, 2020 FINDINGS: Cardiovascular: There is moderate to marked severity calcification of the aortic arch. Satisfactory opacification of the pulmonary arteries to the segmental level. No evidence of pulmonary embolism. Normal heart size with moderate to marked severity coronary artery calcification. No pericardial effusion. Mediastinum/Nodes: Mild paratracheal and AP window lymphadenopathy is again seen. Small colloid cysts are again seen within the right lobe of the thyroid gland. The trachea and esophagus demonstrate no significant findings. Lungs/Pleura: Mild-to-moderate severity diffuse interstitial thickening is noted bilaterally. This is increased in severity when compared to the prior study. Marked severity multifocal infiltrates are also seen throughout both lungs. This represents a new finding when compared to the prior exam. A 4.1 cm x 1.6 cm posteromedial right upper lobe lung mass is seen. This measures approximately 3.3 cm x 1.4 cm on the prior study. There is no evidence of a pleural effusion or pneumothorax. Upper Abdomen: No acute abnormality. Musculoskeletal: Degenerative changes are noted throughout the thoracic spine. Review of the MIP images  confirms the above findings. IMPRESSION: 1. No evidence of pulmonary embolism. 2. Marked severity bilateral multifocal infiltrates. 3. Diffuse interstitial thickening the which may represent sequelae associated with superimposed pulmonary edema. 4. Posteromedial right upper lobe lung mass which is increased in size when compared to the prior exam. This may be, in part, secondary to surrounding areas of adjacent airspace disease. Correlation with follow-up chest plain film is recommended. 5. Moderate to marked severity coronary artery disease. 6. Aortic atherosclerosis. Aortic Atherosclerosis (ICD10-I70.0). Electronically Signed   By: Virgina Norfolk M.D.   On: 03/25/2020 01:29   DG Chest Port 1 View  Result Date: 03/25/2020 CLINICAL DATA:  Shortness of breath. EXAM: PORTABLE CHEST 1 VIEW COMPARISON:  03/25/2020 CT.  03/24/2020 and prior radiographs FINDINGS: This is a low volume study. The cardiomediastinal silhouette is unchanged. AA LEFT subclavian Port-A-Cath is present with tip overlying the UPPER SVC. Diffuse bilateral airspace opacities does not appear significantly changed from 03/25/2020 CT but increased from 03/24/2020 chest radiograph. No pneumothorax or definite pleural effusion noted. IMPRESSION: Diffuse bilateral airspace opacities, not significantly changed from 03/25/2020 CT but increased from 03/24/2020 chest radiograph. Electronically Signed   By: Margarette Canada M.D.   On: 03/25/2020 06:50   DG Chest Port 1 View  Result Date: 03/24/2020 CLINICAL DATA:  Suspected sepsis.  Metastatic lung cancer. EXAM: PORTABLE CHEST 1 VIEW COMPARISON:  03/17/2020 FINDINGS: Left Port-A-Cath remains in place, unchanged. Cardiomegaly. Low lung volumes. Right upper lobe nodule/mass seen on prior CT not appreciable on plain film. Chronic interstitial changes. Bibasilar  airspace opacities. No effusions or acute bony abnormality. IMPRESSION: Low lung volumes.  Bibasilar atelectasis or infiltrates. Cardiomegaly.  Chronic interstitial lung disease. Electronically Signed   By: Rolm Baptise M.D.   On: 03/24/2020 20:18     Assessment & Plan    1. Acute on Chronic Combined Systolic and Diastolic CHF - He has a complicated clinical picture as he was undergoing treatment for Stage 4 lung cancer with brain mets but recently suffered from immunotherapy-related colitis which led to his treatment being held.  - Presented with worsening dyspnea and concerns for Sepsis. Found to have HCAP and BNP elevated to 454. Echo shows a new cardiomyopathy with EF at 30 to 35% with global HK, moderate LVH, Grade 1 DD, trivial pericardial effusion and trivial AI.   Etiology of systolic dysfunction is unclear   He does have coronary artery dz as noted on CT scan   Has intermitt CP atypical though (R sided to L sided; intermitt)  VS related to chemotherapy   Vs idiopathic. Given other medical issues would reocmm medical Rx     - He initially received IVF due to concerns for sepsis and I&O's are at + 9.7L this admission. Weight below his previous baseline but he has been experiencing significant diarrhea for weeks which has now improved. Given his rales and edema on examination, agree with titration of IV Lasix to 40mg  BID. - He is on Toprol-XL 25mg  daily. Given his frequent ectopy, will titrate to 25mg  BID. Also keep K+ ~ 4.0 and Mg ~ 2.0 to assist with ectopy. He did receive Amlodipine today. Will discontinue and if BP allows, can hopefully start Losartan tomorrow and add Spironolactone prior to discharge. Could consider transitioning Losartan to [facility] as an outpatient if BP remains stable. Again, given his Stage 4 Lung Cancer and no recent anginal symptoms, would focus on medical therapy of his cardiomyopathy.  2. HCAP - Remains on Cefepime. Further management per admitting team.   3. HTN - BP has been variable from 106/61 - 172/74 within the past 24 hours. He is currently on Amlodipine 2.5mg  daily and Toprol-XL 25mg  daily.  Will titrate Toprol-XL to 25mg  BID in the setting of his cardiomyopathy and frequent ectopy. Will stop Amlodipine to allow for titration of medical therapy for this cardiomyopathy.   4. HLD - Followed by PCP as an outpatient. He has been continued on PTA Simvastatin 20mg  daily.   5. Stage 3 CKD - Baseline creatinine 1.4 - 1.5. Stable at 1.32 this AM. Continue to follow closely with diuresis.   6. Stage 4 Lung Cancer with Brain Mets -  Followed by Dr. Delton Coombes. He is on Keytruda but this has been held since 12/2019 by review of notes. Cardiac complications for this include cardiac arrhythmia (4% to 11%) and peripheral edema (11% to 15%).    For questions or updates, please contact [place] Please consult [email or web address] for contact info under Cardiology/STEMI.  Signed, Erma Heritage, PA-C 03/27/2020, 10:06 AM Pager: [phone]   Pt seen and examined  I agree with findings as noted above by B Strader above  Pt is a 74 yo with Stage IV lung CA   Had undergone immunotherapy Presents with increased SOB    Being treated for pneumonia   Echo shows LVEF 30 to 35%  Exam:  Pt on High Low Ow Neck:  JVP is increased   No bruits Lungs with rales at R base Cardiac exam:   RRR   No S3  No signif murmurs  Ext without edema   (wife says signfi improved from admit)  REcomm: 1  Acute on chronic systolic CHF   Agree with plans of IV diuresis   Pt  Still with sx of increased volume on exam  Would titrate meds as outlined above by B Strader as BP tolerates PT says his breathing is about the same from admit   He remains on high O2.He was not on O2 at home prior to admit  Given stage of lung CA would not pursue ischemic evaluation unless he develops unstable symptoms   I am not convinced CPs represent angina Would check lpids to confirm that LDL is not severely elevated . Continue IV lasix   Strict I/O   Pt was not on oxygen at prior to admit  Will continue to follow   Dorris Carnes MD

## 2020-03-28 DIAGNOSIS — J9601 Acute respiratory failure with hypoxia: Secondary | ICD-10-CM | POA: Diagnosis not present

## 2020-03-28 LAB — CBC
HCT: 36.9 % — ABNORMAL LOW (ref 39.0–52.0)
Hemoglobin: 12.1 g/dL — ABNORMAL LOW (ref 13.0–17.0)
MCH: 29.9 pg (ref 26.0–34.0)
MCHC: 32.8 g/dL (ref 30.0–36.0)
MCV: 91.1 fL (ref 80.0–100.0)
Platelets: 296 10*3/uL (ref 150–400)
RBC: 4.05 MIL/uL — ABNORMAL LOW (ref 4.22–5.81)
RDW: 15.8 % — ABNORMAL HIGH (ref 11.5–15.5)
WBC: 14.5 10*3/uL — ABNORMAL HIGH (ref 4.0–10.5)
nRBC: 0.2 % (ref 0.0–0.2)

## 2020-03-28 LAB — BASIC METABOLIC PANEL
Anion gap: 11 (ref 5–15)
BUN: 21 mg/dL (ref 8–23)
CO2: 29 mmol/L (ref 22–32)
Calcium: 8.3 mg/dL — ABNORMAL LOW (ref 8.9–10.3)
Chloride: 96 mmol/L — ABNORMAL LOW (ref 98–111)
Creatinine, Ser: 1.36 mg/dL — ABNORMAL HIGH (ref 0.61–1.24)
GFR, Estimated: 54 mL/min — ABNORMAL LOW (ref >60)
Glucose, Bld: 129 mg/dL — ABNORMAL HIGH (ref 70–99)
Potassium: 3.3 mmol/L — ABNORMAL LOW (ref 3.5–5.1)
Sodium: 136 mmol/L (ref 135–145)

## 2020-03-28 LAB — GLUCOSE, CAPILLARY
Glucose-Capillary: 103 mg/dL — ABNORMAL HIGH (ref 70–99)
Glucose-Capillary: 123 mg/dL — ABNORMAL HIGH (ref 70–99)
Glucose-Capillary: 253 mg/dL — ABNORMAL HIGH (ref 70–99)

## 2020-03-28 LAB — MAGNESIUM: Magnesium: 1.9 mg/dL (ref 1.7–2.4)

## 2020-03-28 MED ORDER — DULOXETINE HCL 60 MG PO CPEP
60.0000 mg | ORAL_CAPSULE | Freq: Every day | ORAL | Status: DC
  Administered 2020-03-28 – 2020-03-31 (×4): 60 mg via ORAL
  Filled 2020-03-28 (×4): qty 1

## 2020-03-28 MED ORDER — FOLIC ACID 1 MG PO TABS
1.0000 mg | ORAL_TABLET | Freq: Every day | ORAL | Status: DC
  Administered 2020-03-28 – 2020-03-31 (×4): 1 mg via ORAL
  Filled 2020-03-28 (×4): qty 1

## 2020-03-28 MED ORDER — LOSARTAN POTASSIUM 50 MG PO TABS
25.0000 mg | ORAL_TABLET | Freq: Every day | ORAL | Status: DC
  Administered 2020-03-28 – 2020-03-31 (×4): 25 mg via ORAL
  Filled 2020-03-28 (×4): qty 1

## 2020-03-28 MED ORDER — POTASSIUM CHLORIDE 10 MEQ/100ML IV SOLN
10.0000 meq | INTRAVENOUS | Status: AC
  Administered 2020-03-28 (×4): 10 meq via INTRAVENOUS
  Filled 2020-03-28 (×4): qty 100

## 2020-03-28 MED ORDER — POTASSIUM CHLORIDE CRYS ER 20 MEQ PO TBCR
40.0000 meq | EXTENDED_RELEASE_TABLET | Freq: Two times a day (BID) | ORAL | Status: DC
  Administered 2020-03-28 (×2): 40 meq via ORAL
  Filled 2020-03-28: qty 4
  Filled 2020-03-28 (×2): qty 2

## 2020-03-28 MED ORDER — SODIUM CHLORIDE 0.9 % IV SOLN
INTRAVENOUS | Status: DC | PRN
  Administered 2020-03-28: 500 mL via INTRAVENOUS

## 2020-03-28 NOTE — Progress Notes (Addendum)
PROGRESS NOTE    Darrell Christensen  VEH:[id number] DOB: 12-01-1945 DOA: 03/24/2020 PCP: Celene Squibb, MD   Brief Narrative:  Per HPI: Darrell Christensen a 74 y.o.malewith medical history significant forstage IV lung adenocarcinoma, followed by Dr. Delton Coombes with known brain mets, CKD, hypertension, hyperlipidemia, diabetes mellitus, hypertensionwho presents to the emergency department due to a fall sustained at home and he was too weak to get up. Patient was unable to provide history of why he came to the ED, his only complaint was bilateral leg swelling. History was obtained from ED physician and from ED medical record. Per report, Family member checked on him and found him on the floor (he lives by himself), so he was brought to the ED with concern the patient is unable to live by himself and that he might need a facility care. 1 tablet of Xanax (dose unknown) was given prior to bringing the patient to the ED. There was no complaint of fever, chills, chest pain, nausea vomiting abdominal pain.  -Patient was admitted with acute hypoxemic respiratory failure in the setting of possible HCAP/aspiration pneumonia with associated sepsis on admission as well as concern for pulmonary edema with possible congestive heart failure. He is noted to be on chronic prednisone which may contribute to the leukocytosis. His primary concern on admission was lower extremity edema and weakness. He was recently discharged from the hospital on 11/13 and was admitted for AKI/dehydration at that time and received IV fluids. His 2D echocardiogram on 11/20 demonstrates a significant reduction in LV function with global hypokinesis and LVEF of 30-35%.Patient does state that he is DNR. Patient is being diuresed and Cardiology is following. Potassium is being supplemented. Plan is for placement to SNF on discharge with weakness and recurrent admissions.  Assessment & Plan:   Active Problems:   Primary cancer of right  upper lobe of lung (HCC)   HTN (hypertension), benign   Diabetes mellitus ([place])   Brain metastasis ([place])   Acute encephalopathy   Acute respiratory failure with hypoxia (HCC)   Generalized weakness   Fall at home, initial encounter   Leukocytosis   Hypokalemia   Hypoalbuminemia   Pulmonary edema   Healthcare-associated pneumonia   Diabetic neuropathy ([place])   Lung cancer metastatic to brain [facility])   Palliative care by specialist   Acute hypoxemic respiratory failure-multifactorial; improving -Currently on 5L [place] and without respiratory distress -In setting of HCAP and more likely acute systolic and diastolic CHF exacerbation -Continue on cefepime empirically day 5/7; blood cultures x 2 negative -Wean O2 as tolerated  Acute systolic and diastolic CHF exacerbation-improving -IV Lasix for diuresisresumed bid on 11/22 -Wean oxygen as tolerated -Resume home metoprolol -Monitor daily weights -Strict I's and O's -Negative 3.6L noted overnight -Monitor potassium levels closely -ARB added by Cardiology 11/23  Acute encephalopathy-improved -Further labs negative -CT head with no acute findings and stable atrophy and mets -No hypercapnia noted  Hypokalemia-persistent -Likely related to prior chronic diarrhea with total body potassium levels diminished -Continue to replete IV and oral especially with ongoing Lasix -Recheck with mag in a.m.  Stage IV lung adenocarcinoma with brain metastasis -Follows with Dr. Delton Coombes outpatient, chemo recently held due to diarrhea -Patient is DNR -Seen by palliative care with plans to continue ongoing treatment and follow-up in near future  CKD stage III -Current creatinine 1.3 with baseline near 1.5-1.8 -Monitor closely with Lasix use  Hypertension-elevated -Continue blood pressure agents from home as ordered -Monitor with aggressive IV diuresis, holding  home torsemide -IV labetalol as needed  Peripheral neuropathy -Plan to  resume home duloxetine  Type 2 diabetes -Without any significant hyperglycemia currently -Some noted hypoglycemia, maintain on sliding scale insulin for now  Moderate protein calorie malnutrition   DVT prophylaxis:Heparin Code Status:DNR Family Communication:Discussed with daughter on phone 11/23 Disposition Plan: Status is: Inpatient  Remains inpatient appropriate because:IV treatments appropriate due to intensity of illness or inability to take PO and Inpatient level of care appropriate due to severity of illness   Dispo: The patient is from:Home Anticipated d/c is to:SNF Anticipated d/c date is: 1-2 days Patient currently is not medically stable to d/c.  Patient needs ongoing diuresis and weaning of oxygen requirements as well as potassium supplementation prior to discharge.  Cardiology assisting with management.  Consultants:  Cardiology  Palliative  Procedures:  See below  Antimicrobials:  Anti-infectives (From admission, onward)   Start     Dose/Rate Route Frequency Ordered Stop   03/25/20 2000  vancomycin (VANCOREADY) IVPB 1500 mg/300 mL  Status:  Discontinued        1,500 mg 150 mL/hr over 120 Minutes Intravenous Every 24 hours 03/24/20 2005 03/25/20 1244   03/25/20 1000  ceFEPIme (MAXIPIME) 2 g in sodium chloride 0.9 % 100 mL IVPB        2 g 200 mL/hr over 30 Minutes Intravenous Every 12 hours 03/24/20 2005     03/24/20 2200  metroNIDAZOLE (FLAGYL) tablet 500 mg  Status:  Discontinued        500 mg Oral Every 8 hours 03/24/20 1933 03/25/20 0810   03/24/20 2015  vancomycin (VANCOCIN) IVPB 1000 mg/200 mL premix        1,000 mg 200 mL/hr over 60 Minutes Intravenous  Once 03/24/20 2005 03/24/20 2154   03/24/20 1945  ceFEPIme (MAXIPIME) 2 g in sodium chloride 0.9 % 100 mL IVPB        2 g 200 mL/hr over 30 Minutes Intravenous  Once 03/24/20 1933 03/24/20 2009   03/24/20 1945  vancomycin (VANCOCIN) IVPB 1000  mg/200 mL premix        1,000 mg 200 mL/hr over 60 Minutes Intravenous  Once 03/24/20 1933 03/24/20 2048       Subjective: Patient seen and evaluated today with no new acute complaints or concerns. No acute concerns or events noted overnight.  Objective: Vitals:   03/27/20 2053 03/27/20 2359 03/28/20 0253 03/28/20 0454  BP: (!) 163/93 138/81  (!) 135/91  Pulse: 70 68  65  Resp: 18 18  18   Temp: 97.8 F (36.6 C) 97.9 F (36.6 C)  98.2 F (36.8 C)  TempSrc:      SpO2: 96% 95%  99%  Weight:   94.3 kg   Height:        Intake/Output Summary (Last 24 hours) at 03/28/2020 1048 Last data filed at 03/28/2020 1002 Gross per 24 hour  Intake 977 ml  Output 4625 ml  Net -3648 ml   Filed Weights   03/26/20 0500 03/27/20 0700 03/28/20 0253  Weight: 93.5 kg 94.2 kg 94.3 kg    Examination:  General exam: Appears calm and comfortable  Respiratory system: Clear to auscultation. Respiratory effort normal.  Currently on 5 L nasal cannula oxygen. Cardiovascular system: S1 & S2 heard, RRR.  Gastrointestinal system: Abdomen is nondistended, soft and nontender.  Central nervous system: Alert and oriented. No focal neurological deficits. Extremities: Bilateral lower extremity ankle edema present Skin: No rashes, lesions or ulcers Psychiatry: Flat affect  Data Reviewed: I have personally reviewed following labs and imaging studies  CBC: Recent Labs  Lab 03/24/20 1857 03/25/20 0419 03/26/20 1139 03/27/20 0419 03/28/20 0451  WBC 15.8* 12.3* 11.5* 13.3* 14.5*  NEUTROABS 11.0*  --   --   --   --   HGB 11.7* 11.4* 11.4* 12.3* 12.1*  HCT 35.7* 34.9* 34.3* 37.8* 36.9*  MCV 91.3 93.1 91.0 90.6 91.1  PLT 238 233 218 260 384   Basic Metabolic Panel: Recent Labs  Lab 03/24/20 1857 03/25/20 0419 03/26/20 1139 03/27/20 0419 03/28/20 0451  NA 137 139 134* 136 136  K 2.8* 3.0* 3.0* 3.2* 3.3*  CL 101 108 98 100 96*  CO2 27 24 27 25 29   GLUCOSE 98 86 192* 92 129*  BUN 17 14 16  20 21   CREATININE 1.55* 1.32*  1.29* 1.39* 1.32* 1.36*  CALCIUM 7.7* 6.9* 7.3* 8.2* 8.3*  MG  --  1.7 1.6* 2.1 1.9  PHOS  --  2.1*  --   --   --    GFR: Estimated Creatinine Clearance: 55 mL/min (A) (by C-G formula based on SCr of 1.36 mg/dL (H)). Liver Function Tests: Recent Labs  Lab 03/24/20 1857 03/25/20 0419  AST 18 15  ALT 22 19  ALKPHOS 50 44  BILITOT 0.9 0.7  PROT 5.7* 5.2*  ALBUMIN 2.7* 2.3*   No results for input(s): LIPASE, AMYLASE in the last 168 hours. Recent Labs  Lab 03/26/20 1139  AMMONIA 10   Coagulation Profile: Recent Labs  Lab 03/24/20 1857 03/25/20 0419  INR 1.3* 1.2   Cardiac Enzymes: Recent Labs  Lab 03/25/20 0920  CKTOTAL 114   BNP (last 3 results) No results for input(s): PROBNP in the last 8760 hours. HbA1C: No results for input(s): HGBA1C in the last 72 hours. CBG: Recent Labs  Lab 03/27/20 0738 03/27/20 1108 03/27/20 1559 03/27/20 2054 03/28/20 0807  GLUCAP 104* 198* 298* 198* 103*   Lipid Profile: Recent Labs    03/27/20 0446  CHOL 121  HDL 41  LDLCALC 66  TRIG 72  CHOLHDL 3.0   Thyroid Function Tests: Recent Labs    03/26/20 1139  TSH 1.274   Anemia Panel: No results for input(s): VITAMINB12, FOLATE, FERRITIN, TIBC, IRON, RETICCTPCT in the last 72 hours. Sepsis Labs: Recent Labs  Lab 03/24/20 1857 03/24/20 2059 03/25/20 0920  PROCALCITON  --   --  0.69  LATICACIDVEN 1.8 1.3  --     Recent Results (from the past 240 hour(s))  Culture, blood (Routine x 2)     Status: None (Preliminary result)   Collection Time: 03/24/20  6:57 PM   Specimen: Left Antecubital; Blood  Result Value Ref Range Status   Specimen Description LEFT ANTECUBITAL  Final   Special Requests   Final    BOTTLES DRAWN AEROBIC AND ANAEROBIC Blood Culture results may not be optimal due to an inadequate volume of blood received in culture bottles   Culture   Final    NO GROWTH 4 DAYS Performed at [facility], [address]  [place], [place] [address]    Report Status PENDING  Incomplete  Culture, blood (Routine x 2)     Status: None (Preliminary result)   Collection Time: 03/24/20  6:57 PM   Specimen: Right Antecubital; Blood  Result Value Ref Range Status   Specimen Description RIGHT ANTECUBITAL  Final   Special Requests   Final    BOTTLES DRAWN AEROBIC AND ANAEROBIC Blood Culture results may  not be optimal due to an inadequate volume of blood received in culture bottles   Culture   Final    NO GROWTH 4 DAYS Performed at [facility], [address] [place], [place] [address]    Report Status PENDING  Incomplete  Resp Panel by RT-PCR (Flu A&B, Covid) Nasopharyngeal Swab     Status: None   Collection Time: 03/24/20  6:58 PM   Specimen: Nasopharyngeal Swab; Nasopharyngeal(NP) swabs in vial transport medium  Result Value Ref Range Status   SARS Coronavirus 2 by RT PCR NEGATIVE NEGATIVE Final    Comment: (NOTE) SARS-CoV-2 target nucleic acids are NOT DETECTED.  The SARS-CoV-2 RNA is generally detectable in upper respiratory specimens during the acute phase of infection. The lowest concentration of SARS-CoV-2 viral copies this assay can detect is 138 copies/mL. A negative result does not preclude SARS-Cov-2 infection and should not be used as the sole basis for treatment or other patient management decisions. A negative result may occur with  improper specimen collection/handling, submission of specimen other than nasopharyngeal swab, presence of viral mutation(s) within the areas targeted by this assay, and inadequate number of viral copies(<138 copies/mL). A negative result must be combined with clinical observations, patient history, and epidemiological information. The expected result is Negative.  Fact Sheet for Patients:  EntrepreneurPulse.com.au  Fact Sheet for Healthcare Providers:  IncredibleEmployment.be  This test is no t yet approved or cleared by the [place] FDA and  has been authorized for detection and/or diagnosis of SARS-CoV-2 by FDA under an Emergency Use Authorization (EUA). This EUA will remain  in effect (meaning this test can be used) for the duration of the COVID-19 declaration under Section 564(b)(1) of the Act, 21 U.S.C.section 360bbb-3(b)(1), unless the authorization is terminated  or revoked sooner.       Influenza A by PCR NEGATIVE NEGATIVE Final   Influenza B by PCR NEGATIVE NEGATIVE Final    Comment: (NOTE) The Xpert Xpress SARS-CoV-2/FLU/RSV plus assay is intended as an aid in the diagnosis of influenza from Nasopharyngeal swab specimens and should not be used as a sole basis for treatment. Nasal washings and aspirates are unacceptable for Xpert Xpress SARS-CoV-2/FLU/RSV testing.  Fact Sheet for Patients: EntrepreneurPulse.com.au  Fact Sheet for Healthcare Providers: IncredibleEmployment.be  This test is not yet approved or cleared by the [place] FDA and has been authorized for detection and/or diagnosis of SARS-CoV-2 by FDA under an Emergency Use Authorization (EUA). This EUA will remain in effect (meaning this test can be used) for the duration of the COVID-19 declaration under Section 564(b)(1) of the Act, 21 U.S.C. section 360bbb-3(b)(1), unless the authorization is terminated or revoked.  Performed at [facility], [address], [place], [place] [address]   MRSA PCR Screening     Status: None   Collection Time: 03/25/20  5:12 AM   Specimen: Nasal Mucosa; Nasopharyngeal  Result Value Ref Range Status   MRSA by PCR NEGATIVE NEGATIVE Final    Comment:        The GeneXpert MRSA Assay (FDA approved for NASAL specimens only), is one component of a comprehensive MRSA colonization surveillance program. It is not intended to diagnose MRSA infection nor to guide or monitor treatment for MRSA infections. Performed at [facility], [address],  [place], [place] [address]          Radiology Studies: No results found.      Scheduled Meds: . Chlorhexidine Gluconate Cloth  6 each Topical Daily  .  feeding supplement (GLUCERNA SHAKE)  237 mL Oral TID BM  . furosemide  40 mg Intravenous Q12H  . heparin  5,000 Units Subcutaneous Q8H  . insulin aspart  0-5 Units Subcutaneous QHS  . insulin aspart  0-9 Units Subcutaneous TID WC  . metoprolol succinate  25 mg Oral BID  . potassium chloride  40 mEq Oral BID  . predniSONE  50 mg Oral Q breakfast  . simvastatin  20 mg Oral QPM   Continuous Infusions: . sodium chloride 500 mL (03/28/20 0839)  . ceFEPime (MAXIPIME) IV 2 g (03/27/20 2106)  . potassium chloride 10 mEq (03/28/20 0957)     LOS: 3 days    Time spent: 30 minutes    Otho Michalik Darleen Crocker, DO [facility]  If 7PM-7AM, please contact night-coverage [email or web address] 03/28/2020, 10:48 AM

## 2020-03-28 NOTE — Progress Notes (Signed)
BS 161 @ 2200, did not cross over

## 2020-03-28 NOTE — Progress Notes (Signed)
Physical Therapy Treatment Patient Details Name: Darrell Christensen MRN: [id number] DOB: 1945-10-26 Today's Date: 03/28/2020    History of Present Illness Darrell Christensen is a 74 y.o. male with medical history significant for stage IV lung adenocarcinoma, followed by Dr. Delton Coombes with known brain mets, CKD, hypertension, hyperlipidemia, diabetes mellitus, hypertension who presents to the emergency department due to a fall sustained at home and he was too weak to get up.  Patient was unable to provide history of why he came to the ED, his only complaint was bilateral leg swelling.  History was obtained from ED physician and from ED medical record.  Per report, Family member checked on him and found him on the floor (he lives by himself), so he was brought to the ED with concern the patient is unable to live by himself and that he might need a facility care.  1 tablet of Xanax (dose unknown) was given prior to bringing the patient to the ED.  There was no complaint of fever, chills, chest pain, nausea vomiting abdominal pain    PT Comments    Patient tolerated tx session well and able to perform increased activities with rest periods ad lib due to fatigue and SOB.  O2 saturation levels fluctuating from 93-87% with exertion and cues for pacing and pursed-lip breathing. Patient continues to demonstrate reduced functional activity tolerance. Patient will benefit from continued physical therapy in hospital and recommended venue below to increase strength, balance, endurance for safe ADLs and gait.     Follow Up Recommendations  SNF     Equipment Recommendations  Rolling walker with 5" wheels    Recommendations for Other Services       Precautions / Restrictions Precautions Precautions: Fall Precaution Comments: Oxygen Restrictions Weight Bearing Restrictions: No    Mobility  Bed Mobility Overal bed mobility: Needs Assistance Bed Mobility: Supine to Sit     Supine to sit: Min assist;Mod  assist Sit to supine: Min assist      Transfers Overall transfer level: Needs assistance Equipment used: Rolling walker (2 wheeled) Transfers: Stand Pivot Transfers;Sit to/from Stand Sit to Stand: Min assist Stand pivot transfers: Min assist       General transfer comment: slow labored movement, difficulty with turning due to LE weakness  Ambulation/Gait Ambulation/Gait assistance: Min assist Gait Distance (Feet): 14 Feet Assistive device: Rolling walker (2 wheeled) Gait Pattern/deviations: Decreased step length - right;Decreased step length - left;Decreased stride length Gait velocity: decreased   General Gait Details: demonstrates slow labored cadence, had to use RW due to BLE weakness, limited due to fatigue and SpO2 dropping from 93% to 87% while on 5 LPM O2   Stairs             Wheelchair Mobility    Modified Rankin (Stroke Patients Only)       Balance                                            Cognition                                              Exercises General Exercises - Lower Extremity Ankle Circles/Pumps: AROM;20 reps;Both (3x) Quad Sets: Strengthening;Both;20 reps (2 sets) Long Arc Quad: AROM;Both;20 reps (  2 sets) Straight Leg Raises: Strengthening;Both;15 reps (2 sets)    General Comments        Pertinent Vitals/Pain Pain Assessment: No/denies pain    Home Living                      Prior Function            PT Goals (current goals can now be found in the care plan section) Acute Rehab PT Goals Patient Stated Goal: return home PT Goal Formulation: With patient Time For Goal Achievement: 04/10/20 Potential to Achieve Goals: Good Progress towards PT goals: Progressing toward goals    Frequency    Min 3X/week      PT Plan Current plan remains appropriate    Co-evaluation              AM-PAC PT "6 Clicks" Mobility   Outcome Measure  Help needed turning from your  back to your side while in a flat bed without using bedrails?: A Little Help needed moving from lying on your back to sitting on the side of a flat bed without using bedrails?: A Lot Help needed moving to and from a bed to a chair (including a wheelchair)?: A Little Help needed standing up from a chair using your arms (e.g., wheelchair or bedside chair)?: A Little Help needed to walk in hospital room?: A Lot Help needed climbing 3-5 steps with a railing? : Total 6 Click Score: 14    End of Session Equipment Utilized During Treatment: Oxygen Activity Tolerance: Patient tolerated treatment well;Patient limited by fatigue Patient left: in chair;with call bell/phone within reach Nurse Communication: Mobility status PT Visit Diagnosis: Unsteadiness on feet (R26.81);Other abnormalities of gait and mobility (R26.89);Muscle weakness (generalized) (M62.81)     Time: 9407-6808 PT Time Calculation (min) (ACUTE ONLY): 20 min  Charges:  $Therapeutic Exercise: 8-22 mins $Therapeutic Activity: 8-22 mins                     11:51 AM, 03/28/20 M. Sherlyn Lees, PT, DPT Physical Therapist- [place] Office Number: [phone]

## 2020-03-28 NOTE — Progress Notes (Signed)
   Primary Cardiologist:  Harrington Challenger  Subjective:  Chronic dyspnea but not struggling   Objective:  Vitals:   03/27/20 2053 03/27/20 2359 03/28/20 0253 03/28/20 0454  BP: (!) 163/93 138/81  (!) 135/91  Pulse: 70 68  65  Resp: 18 18  18   Temp: 97.8 F (36.6 C) 97.9 F (36.6 C)  98.2 F (36.8 C)  TempSrc:      SpO2: 96% 95%  99%  Weight:   94.3 kg   Height:        Intake/Output from previous day:  Intake/Output Summary (Last 24 hours) at 03/28/2020 1050 Last data filed at 03/28/2020 1002 Gross per 24 hour  Intake 977 ml  Output 4625 ml  Net -3648 ml    Physical Exam: Chronically ill Diffuse rhonchi  Port a cath left subclavian No murmur  Abdomen soft Trace LE edema   Lab Results: Basic Metabolic Panel: Recent Labs    03/27/20 0419 03/28/20 0451  NA 136 136  K 3.2* 3.3*  CL 100 96*  CO2 25 29  GLUCOSE 92 129*  BUN 20 21  CREATININE 1.32* 1.36*  CALCIUM 8.2* 8.3*  MG 2.1 1.9   Liver Function Tests: No results for input(s): AST, ALT, ALKPHOS, BILITOT, PROT, ALBUMIN in the last 72 hours. No results for input(s): LIPASE, AMYLASE in the last 72 hours. CBC: Recent Labs    03/27/20 0419 03/28/20 0451  WBC 13.3* 14.5*  HGB 12.3* 12.1*  HCT 37.8* 36.9*  MCV 90.6 91.1  PLT 260 296   Cardiac Enzymes: No results for input(s): CKTOTAL, CKMB, CKMBINDEX, TROPONINI in the last 72 hours. BNP: Invalid input(s): POCBNP D-Dimer: No results for input(s): DDIMER in the last 72 hours. Hemoglobin A1C: No results for input(s): HGBA1C in the last 72 hours. Fasting Lipid Panel: Recent Labs    03/27/20 0446  CHOL 121  HDL 41  LDLCALC 66  TRIG 72  CHOLHDL 3.0   Thyroid Function Tests: Recent Labs    03/26/20 1139  TSH 1.274   Anemia Panel: No results for input(s): VITAMINB12, FOLATE, FERRITIN, TIBC, IRON, RETICCTPCT in the last 72 hours.  Imaging: No results found.  Cardiac Studies:  ECG: SR PVC nonspecific ST changes poor R wave progression     Telemetry:  NSR 03/28/2020   Echo: EF 30-35%   Medications:   . Chlorhexidine Gluconate Cloth  6 each Topical Daily  . feeding supplement (GLUCERNA SHAKE)  237 mL Oral TID BM  . furosemide  40 mg Intravenous Q12H  . heparin  5,000 Units Subcutaneous Q8H  . insulin aspart  0-5 Units Subcutaneous QHS  . insulin aspart  0-9 Units Subcutaneous TID WC  . metoprolol succinate  25 mg Oral BID  . potassium chloride  40 mEq Oral BID  . predniSONE  50 mg Oral Q breakfast  . simvastatin  20 mg Oral QPM     . sodium chloride 500 mL (03/28/20 0839)  . ceFEPime (MAXIPIME) IV 2 g (03/27/20 2106)  . potassium chloride 10 mEq (03/28/20 0957)    Assessment/Plan:   1. CHF:  Newly diagnosed decrease in LV function More palliative patient with metastatic lung cancer. CT/CXR suggest lymphangitic spread of tumor. Check BNP was low on 03/17/20 continue lasix with good diuresis 3.6 L's Supplement K Add ARB He is not a candidate for any further cardiac w/u given prognosis   Jenkins Rouge 03/28/2020, 10:50 AM

## 2020-03-28 NOTE — TOC Progression Note (Signed)
Transition of Care [facility]) - Progression Note    Patient Details  Name: Darrell Christensen MRN: [id number] Date of Birth: 1945-05-14  Transition of Care [facility]) CM/SW Contact  Boneta Lucks, RN Phone Number: 03/28/2020, 11:09 AM  Clinical Narrative:   BC/BS called [facility] is not in network, Patient first choice was to stay in [place]. Patient accepted bed offer at [facility]. TOC spoke with Anderson Malta - grand daughter. She agreed with [facility]. She states patient has had both COVID vaccines. INS AUTH approved for 7 days ID [id number].  Faxed updated to [phone] to [facility] Friday. Ebony Hail at [facility] updated.    Expected Discharge Plan: [place] Barriers to Discharge: Continued Medical Work up  Expected Discharge Plan and Services Expected Discharge Plan: [place] arrangements for the past 2 months: Single Family Home         Readmission Risk Interventions Readmission Risk Prevention Plan 03/27/2020  Transportation Screening Complete  HRI or Home Care Consult Complete  Social Work Consult for [place] Planning/Counseling Complete  Palliative Care Screening Not Applicable  Medication Review Press photographer) Complete  Some recent data might be hidden

## 2020-03-29 DIAGNOSIS — I429 Cardiomyopathy, unspecified: Secondary | ICD-10-CM

## 2020-03-29 DIAGNOSIS — C7931 Secondary malignant neoplasm of brain: Secondary | ICD-10-CM | POA: Diagnosis not present

## 2020-03-29 DIAGNOSIS — I10 Essential (primary) hypertension: Secondary | ICD-10-CM | POA: Diagnosis not present

## 2020-03-29 DIAGNOSIS — I5021 Acute systolic (congestive) heart failure: Secondary | ICD-10-CM

## 2020-03-29 DIAGNOSIS — J9601 Acute respiratory failure with hypoxia: Secondary | ICD-10-CM | POA: Diagnosis not present

## 2020-03-29 DIAGNOSIS — E1165 Type 2 diabetes mellitus with hyperglycemia: Secondary | ICD-10-CM | POA: Diagnosis not present

## 2020-03-29 DIAGNOSIS — E7849 Other hyperlipidemia: Secondary | ICD-10-CM | POA: Diagnosis not present

## 2020-03-29 LAB — CBC
HCT: 37.8 % — ABNORMAL LOW (ref 39.0–52.0)
Hemoglobin: 12.1 g/dL — ABNORMAL LOW (ref 13.0–17.0)
MCH: 29.5 pg (ref 26.0–34.0)
MCHC: 32 g/dL (ref 30.0–36.0)
MCV: 92.2 fL (ref 80.0–100.0)
Platelets: 295 10*3/uL (ref 150–400)
RBC: 4.1 MIL/uL — ABNORMAL LOW (ref 4.22–5.81)
RDW: 15.9 % — ABNORMAL HIGH (ref 11.5–15.5)
WBC: 16.6 10*3/uL — ABNORMAL HIGH (ref 4.0–10.5)
nRBC: 0.3 % — ABNORMAL HIGH (ref 0.0–0.2)

## 2020-03-29 LAB — CULTURE, BLOOD (ROUTINE X 2)
Culture: NO GROWTH
Culture: NO GROWTH

## 2020-03-29 LAB — BASIC METABOLIC PANEL
Anion gap: 10 (ref 5–15)
BUN: 32 mg/dL — ABNORMAL HIGH (ref 8–23)
CO2: 28 mmol/L (ref 22–32)
Calcium: 8.5 mg/dL — ABNORMAL LOW (ref 8.9–10.3)
Chloride: 97 mmol/L — ABNORMAL LOW (ref 98–111)
Creatinine, Ser: 1.46 mg/dL — ABNORMAL HIGH (ref 0.61–1.24)
GFR, Estimated: 50 mL/min — ABNORMAL LOW (ref >60)
Glucose, Bld: 203 mg/dL — ABNORMAL HIGH (ref 70–99)
Potassium: 3.6 mmol/L (ref 3.5–5.1)
Sodium: 135 mmol/L (ref 135–145)

## 2020-03-29 LAB — GLUCOSE, CAPILLARY
Glucose-Capillary: 161 mg/dL — ABNORMAL HIGH (ref 70–99)
Glucose-Capillary: 94 mg/dL (ref 70–99)
Glucose-Capillary: 133 mg/dL — ABNORMAL HIGH (ref 70–99)
Glucose-Capillary: 299 mg/dL — ABNORMAL HIGH (ref 70–99)
Glucose-Capillary: 156 mg/dL — ABNORMAL HIGH (ref 70–99)

## 2020-03-29 LAB — BRAIN NATRIURETIC PEPTIDE: B Natriuretic Peptide: 187 pg/mL — ABNORMAL HIGH (ref 0.0–100.0)

## 2020-03-29 LAB — MAGNESIUM: Magnesium: 1.8 mg/dL (ref 1.7–2.4)

## 2020-03-29 MED ORDER — POTASSIUM CHLORIDE CRYS ER 20 MEQ PO TBCR
20.0000 meq | EXTENDED_RELEASE_TABLET | Freq: Every day | ORAL | Status: DC
  Administered 2020-03-29 – 2020-03-31 (×3): 20 meq via ORAL
  Filled 2020-03-29 (×2): qty 1

## 2020-03-29 MED ORDER — SODIUM CHLORIDE 0.9 % IV SOLN
2.0000 g | Freq: Two times a day (BID) | INTRAVENOUS | Status: AC
  Administered 2020-03-29: 2 g via INTRAVENOUS
  Filled 2020-03-29: qty 2

## 2020-03-29 MED ORDER — FUROSEMIDE 40 MG PO TABS
40.0000 mg | ORAL_TABLET | Freq: Every day | ORAL | Status: DC
  Administered 2020-03-29 – 2020-03-31 (×3): 40 mg via ORAL
  Filled 2020-03-29 (×3): qty 1

## 2020-03-29 MED ORDER — SPIRONOLACTONE 25 MG PO TABS
12.5000 mg | ORAL_TABLET | Freq: Every day | ORAL | Status: DC
  Administered 2020-03-29 – 2020-03-31 (×3): 12.5 mg via ORAL
  Filled 2020-03-29 (×2): qty 0.5
  Filled 2020-03-29: qty 1
  Filled 2020-03-29 (×2): qty 0.5
  Filled 2020-03-29: qty 1
  Filled 2020-03-29 (×2): qty 0.5

## 2020-03-29 NOTE — Progress Notes (Signed)
PROGRESS NOTE    Darrell Christensen  SWN:[id number] DOB: 10-03-1945 DOA: 03/24/2020 PCP: Celene Squibb, MD   Brief Narrative:  Per HPI: Darrell Christensen a 74 y.o.malewith medical history significant forstage IV lung adenocarcinoma, followed by Dr. Delton Coombes with known brain mets, CKD, hypertension, hyperlipidemia, diabetes mellitus, hypertensionwho presents to the emergency department due to a fall sustained at home and he was too weak to get up. Patient was unable to provide history of why he came to the ED, his only complaint was bilateral leg swelling. History was obtained from ED physician and from ED medical record. Per report, Family member checked on him and found him on the floor (he lives by himself), so he was brought to the ED with concern the patient is unable to live by himself and that he might need a facility care. 1 tablet of Xanax (dose unknown) was given prior to bringing the patient to the ED. There was no complaint of fever, chills, chest pain, nausea vomiting abdominal pain.  -Patient was admitted with acute hypoxemic respiratory failure in the setting of possible HCAP/aspiration pneumonia with associated sepsis on admission as well as concern for pulmonary edema with possible congestive heart failure. He is noted to be on chronic prednisone which may contribute to the leukocytosis. His primary concern on admission was lower extremity edema and weakness. He was recently discharged from the hospital on 11/13 and was admitted for AKI/dehydration at that time and received IV fluids. His 2D echocardiogram on 11/20 demonstrates a significant reduction in LV function with global hypokinesis and LVEF of 30-35%.Patient does state that he is DNR. Patient is being diuresed and Cardiology is following. Potassium is being supplemented. Plan is for placement to SNF on discharge with weakness and recurrent admissions.  Assessment & Plan:   Active Problems:   Primary cancer of right  upper lobe of lung (HCC)   HTN (hypertension), benign   Diabetes mellitus ([place])   Brain metastasis ([place])   Acute encephalopathy   Acute respiratory failure with hypoxia (HCC)   Generalized weakness   Fall at home, initial encounter   Leukocytosis   Hypokalemia   Hypoalbuminemia   Pulmonary edema   Healthcare-associated pneumonia   Diabetic neuropathy ([place])   Lung cancer metastatic to brain [facility])   Palliative care by specialist   Acute hypoxemic respiratory failure-multifactorial; improving -Currently on 2.5 L [place] and without respiratory distress -In setting of HCAP and more likely acute systolic and diastolic CHF exacerbation -Continue on cefepime to complete antibiotic therapy -blood cultures x 2 negative -continue to Wean O2 as tolerated  Acute systolic and diastolic CHF exacerbation-improving -Continue to follow low-sodium diet, strict I's and O's and daily weights. -Will continue to wean oxygen as tolerated. -Per cardiology recommendations there is no plan for further ischemic work-up while inpatient due to overall poor prognosis. -Continue conservative management; patient using Toprol XL and losartan.  Low-dose Aldactone also be started. -Lasix-transition to oral route and will assess urine output and further response. -Negative overall 5.2 L since admission. -Not a candidate for ICD  Acute encephalopathy-improved -CT head with no acute findings and stable atrophy and mets -No hypercapnia noted -Continue constant reorientation  Hypokalemia- -continue to follow electrolytes and further replete as needed.  Stage IV lung adenocarcinoma with brain metastasis -Follows with Dr. Delton Coombes outpatient, chemo recently held due to diarrhea -Patient is DNR -Seen by palliative care with plans to continue ongoing treatment and follow-up in near future.  CKD stage III -Current  creatinine 1.46 with baseline near 1.5-1.8 -Continue to monitor closely with use of diuretics and  ARB.    Hypertension -Improved and stable currently -Continue to follow vital signs -Medications for acute systolic heart failure has been added we will also continue assisting with BP management.  Peripheral neuropathy -Will continue to use of duloxetine  Type 2 diabetes -Without any significant hyperglycemia currently -Continue to follow sliding scale insulin.  Isolated choking episode -Speech therapy has been contacted -Try to keep patient in upright position while eating and after completing meals -Consistency has been changed to dysphagia 3  Moderate protein calorie malnutrition -Continue the use of feeding supplements -Follow nutritional intake.   DVT prophylaxis:Heparin Code Status:DNR Family Communication:Discussed with daughter at bedside on 11/24 Disposition Plan: Status is: Inpatient   Dispo: The patient is from:Home Anticipated d/c is to:SNF Anticipated d/c date is: 1-2 days Patient currently is not medically stable to d/c.  Patient needs ongoing diuresis and weaning of oxygen requirements as well as potassium supplementation prior to discharge.  Cardiology assisting with management.  Consultants:  Cardiology  Palliative  Procedures:  See below  Antimicrobials:  Anti-infectives (From admission, onward)   Start     Dose/Rate Route Frequency Ordered Stop   03/29/20 2200  ceFEPIme (MAXIPIME) 2 g in sodium chloride 0.9 % 100 mL IVPB        2 g 200 mL/hr over 30 Minutes Intravenous Every 12 hours 03/29/20 1506 03/30/20 0959   03/25/20 2000  vancomycin (VANCOREADY) IVPB 1500 mg/300 mL  Status:  Discontinued        1,500 mg 150 mL/hr over 120 Minutes Intravenous Every 24 hours 03/24/20 2005 03/25/20 1244   03/25/20 1000  ceFEPIme (MAXIPIME) 2 g in sodium chloride 0.9 % 100 mL IVPB  Status:  Discontinued        2 g 200 mL/hr over 30 Minutes Intravenous Every 12 hours 03/24/20 2005 03/29/20 1506    03/24/20 2200  metroNIDAZOLE (FLAGYL) tablet 500 mg  Status:  Discontinued        500 mg Oral Every 8 hours 03/24/20 1933 03/25/20 0810   03/24/20 2015  vancomycin (VANCOCIN) IVPB 1000 mg/200 mL premix        1,000 mg 200 mL/hr over 60 Minutes Intravenous  Once 03/24/20 2005 03/24/20 2154   03/24/20 1945  ceFEPIme (MAXIPIME) 2 g in sodium chloride 0.9 % 100 mL IVPB        2 g 200 mL/hr over 30 Minutes Intravenous  Once 03/24/20 1933 03/24/20 2009   03/24/20 1945  vancomycin (VANCOCIN) IVPB 1000 mg/200 mL premix        1,000 mg 200 mL/hr over 60 Minutes Intravenous  Once 03/24/20 1933 03/24/20 2048      Subjective: No events overnight; denies chest pain, no nausea, no vomiting.  Reports breathing is getting better.  Still requiring 2.5 L nasal cannula supplementation and expressing shortness of breath with exertion.  Per daughter at bedside patient with episode of choking while trying to have lunch.  Objective: Vitals:   03/28/20 1600 03/28/20 2208 03/29/20 0554 03/29/20 1400  BP: 118/78 108/79 114/63 127/78  Pulse: 75 72 91 67  Resp: 18 20 20 20   Temp: 98 F (36.7 C) 98.1 F (36.7 C) 98.2 F (36.8 C) 97.7 F (36.5 C)  TempSrc: Axillary   Oral  SpO2: 98% 99% 97% 95%  Weight:   89.9 kg   Height:        Intake/Output Summary (Last 24 hours)  at 03/29/2020 1821 Last data filed at 03/29/2020 0921 Gross per 24 hour  Intake 606.35 ml  Output --  Net 606.35 ml   Filed Weights   03/27/20 0700 03/28/20 0253 03/29/20 0554  Weight: 94.2 kg 94.3 kg 89.9 kg    Examination: General exam: Afebrile, no chest pain, no nausea, no vomiting.  Continue to feel slightly short of breath with exertion.  Using 2.5 L nasal cannula supplementation. Respiratory system: Decreased breath sounds at the bases, positive rhonchi at, no using accessory muscles.   Cardiovascular system: S1 and S2 appreciated; no rubs, no gallops, no JVD on exam. Gastrointestinal system: Abdomen is nondistended, soft and  nontender. No organomegaly or masses felt. Normal bowel sounds heard. Central nervous system: Alert and oriented. No focal neurological deficits. Extremities: No cyanosis or clubbing; trace bilateral lower extremity edema appreciated. Skin: No petechiae. Psychiatry: Flat affect, no suicidal ideation, no hallucinations.   Data Reviewed: I have personally reviewed following labs and imaging studies  CBC: Recent Labs  Lab 03/24/20 1857 03/24/20 1857 03/25/20 0419 03/26/20 1139 03/27/20 0419 03/28/20 0451 03/29/20 0550  WBC 15.8*   < > 12.3* 11.5* 13.3* 14.5* 16.6*  NEUTROABS 11.0*  --   --   --   --   --   --   HGB 11.7*   < > 11.4* 11.4* 12.3* 12.1* 12.1*  HCT 35.7*   < > 34.9* 34.3* 37.8* 36.9* 37.8*  MCV 91.3   < > 93.1 91.0 90.6 91.1 92.2  PLT 238   < > 233 218 260 296 295   < > = values in this interval not displayed.   Basic Metabolic Panel: Recent Labs  Lab 03/25/20 0419 03/26/20 1139 03/27/20 0419 03/28/20 0451 03/29/20 0550  NA 139 134* 136 136 135  K 3.0* 3.0* 3.2* 3.3* 3.6  CL 108 98 100 96* 97*  CO2 24 27 25 29 28   GLUCOSE 86 192* 92 129* 203*  BUN 14 16 20 21  32*  CREATININE 1.32*  1.29* 1.39* 1.32* 1.36* 1.46*  CALCIUM 6.9* 7.3* 8.2* 8.3* 8.5*  MG 1.7 1.6* 2.1 1.9 1.8  PHOS 2.1*  --   --   --   --    GFR: Estimated Creatinine Clearance: 46.6 mL/min (A) (by C-G formula based on SCr of 1.46 mg/dL (H)).   Liver Function Tests: Recent Labs  Lab 03/24/20 1857 03/25/20 0419  AST 18 15  ALT 22 19  ALKPHOS 50 44  BILITOT 0.9 0.7  PROT 5.7* 5.2*  ALBUMIN 2.7* 2.3*   No results for input(s): LIPASE, AMYLASE in the last 168 hours.   Recent Labs  Lab 03/26/20 1139  AMMONIA 10   Coagulation Profile: Recent Labs  Lab 03/24/20 1857 03/25/20 0419  INR 1.3* 1.2   Cardiac Enzymes: Recent Labs  Lab 03/25/20 0920  CKTOTAL 114   CBG: Recent Labs  Lab 03/28/20 1628 03/28/20 2154 03/29/20 0755 03/29/20 1142 03/29/20 1700  GLUCAP 253* 161*  94 133* 299*   Lipid Profile: Recent Labs    03/27/20 0446  CHOL 121  HDL 41  LDLCALC 66  TRIG 72  CHOLHDL 3.0   Sepsis Labs: Recent Labs  Lab 03/24/20 1857 03/24/20 2059 03/25/20 0920  PROCALCITON  --   --  0.69  LATICACIDVEN 1.8 1.3  --     Recent Results (from the past 240 hour(s))  Culture, blood (Routine x 2)     Status: None   Collection Time: 03/24/20  6:57 PM  Specimen: Left Antecubital; Blood  Result Value Ref Range Status   Specimen Description LEFT ANTECUBITAL  Final   Special Requests   Final    BOTTLES DRAWN AEROBIC AND ANAEROBIC Blood Culture results may not be optimal due to an inadequate volume of blood received in culture bottles   Culture   Final    NO GROWTH 5 DAYS Performed at [facility], [address] [place], [place] [address]    Report Status 03/29/2020 FINAL  Final  Culture, blood (Routine x 2)     Status: None   Collection Time: 03/24/20  6:57 PM   Specimen: Right Antecubital; Blood  Result Value Ref Range Status   Specimen Description RIGHT ANTECUBITAL  Final   Special Requests   Final    BOTTLES DRAWN AEROBIC AND ANAEROBIC Blood Culture results may not be optimal due to an inadequate volume of blood received in culture bottles   Culture   Final    NO GROWTH 5 DAYS Performed at [facility], [address], [place], [place] [address]    Report Status 03/29/2020 FINAL  Final  Resp Panel by RT-PCR (Flu A&B, Covid) Nasopharyngeal Swab     Status: None   Collection Time: 03/24/20  6:58 PM   Specimen: Nasopharyngeal Swab; Nasopharyngeal(NP) swabs in vial transport medium  Result Value Ref Range Status   SARS Coronavirus 2 by RT PCR NEGATIVE NEGATIVE Final    Comment: (NOTE) SARS-CoV-2 target nucleic acids are NOT DETECTED.  The SARS-CoV-2 RNA is generally detectable in upper respiratory specimens during the acute phase of infection. The lowest concentration of SARS-CoV-2 viral copies this assay can detect is 138 copies/mL. A negative  result does not preclude SARS-Cov-2 infection and should not be used as the sole basis for treatment or other patient management decisions. A negative result may occur with  improper specimen collection/handling, submission of specimen other than nasopharyngeal swab, presence of viral mutation(s) within the areas targeted by this assay, and inadequate number of viral copies(<138 copies/mL). A negative result must be combined with clinical observations, patient history, and epidemiological information. The expected result is Negative.  Fact Sheet for Patients:  EntrepreneurPulse.com.au  Fact Sheet for Healthcare Providers:  IncredibleEmployment.be  This test is no t yet approved or cleared by the [place] FDA and  has been authorized for detection and/or diagnosis of SARS-CoV-2 by FDA under an Emergency Use Authorization (EUA). This EUA will remain  in effect (meaning this test can be used) for the duration of the COVID-19 declaration under Section 564(b)(1) of the Act, 21 U.S.C.section 360bbb-3(b)(1), unless the authorization is terminated  or revoked sooner.       Influenza A by PCR NEGATIVE NEGATIVE Final   Influenza B by PCR NEGATIVE NEGATIVE Final    Comment: (NOTE) The Xpert Xpress SARS-CoV-2/FLU/RSV plus assay is intended as an aid in the diagnosis of influenza from Nasopharyngeal swab specimens and should not be used as a sole basis for treatment. Nasal washings and aspirates are unacceptable for Xpert Xpress SARS-CoV-2/FLU/RSV testing.  Fact Sheet for Patients: EntrepreneurPulse.com.au  Fact Sheet for Healthcare Providers: IncredibleEmployment.be  This test is not yet approved or cleared by the [place] FDA and has been authorized for detection and/or diagnosis of SARS-CoV-2 by FDA under an Emergency Use Authorization (EUA). This EUA will remain in effect (meaning this test can be used)  for the duration of the COVID-19 declaration under Section 564(b)(1) of the Act, 21 U.S.C. section 360bbb-3(b)(1), unless the authorization is terminated  or revoked.  Performed at [facility], [address], [place], [place] [address]   MRSA PCR Screening     Status: None   Collection Time: 03/25/20  5:12 AM   Specimen: Nasal Mucosa; Nasopharyngeal  Result Value Ref Range Status   MRSA by PCR NEGATIVE NEGATIVE Final    Comment:        The GeneXpert MRSA Assay (FDA approved for NASAL specimens only), is one component of a comprehensive MRSA colonization surveillance program. It is not intended to diagnose MRSA infection nor to guide or monitor treatment for MRSA infections. Performed at [facility], [address], [place], [place] [address]      Scheduled Meds: . Chlorhexidine Gluconate Cloth  6 each Topical Daily  . DULoxetine  60 mg Oral Daily  . feeding supplement (GLUCERNA SHAKE)  237 mL Oral TID BM  . folic acid  1 mg Oral Daily  . furosemide  40 mg Oral Daily  . heparin  5,000 Units Subcutaneous Q8H  . insulin aspart  0-5 Units Subcutaneous QHS  . insulin aspart  0-9 Units Subcutaneous TID WC  . losartan  25 mg Oral Daily  . metoprolol succinate  25 mg Oral BID  . potassium chloride  20 mEq Oral Daily  . predniSONE  50 mg Oral Q breakfast  . simvastatin  20 mg Oral QPM  . spironolactone  12.5 mg Oral Daily   Continuous Infusions: . sodium chloride 500 mL (03/28/20 0839)  . ceFEPime (MAXIPIME) IV       LOS: 4 days    Time spent: 30 minutes    Barton Dubois, MD [facility]  If 7PM-7AM, please contact night-coverage [email or web address] 03/29/2020, 6:21 PM

## 2020-03-29 NOTE — Care Management Important Message (Signed)
Important Message  Patient Details  Name: Darrell Christensen MRN: [id number] Date of Birth: May 20, 1945   Medicare Important Message Given:  Yes     Tommy Medal 03/29/2020, 4:23 PM

## 2020-03-29 NOTE — Progress Notes (Signed)
Physical Therapy Treatment Patient Details Name: Darrell Christensen MRN: [id number] DOB: 1945/05/29 Today's Date: 03/29/2020    History of Present Illness Darrell Christensen is a 74 y.o. male with medical history significant for stage IV lung adenocarcinoma, followed by Dr. Delton Coombes with known brain mets, CKD, hypertension, hyperlipidemia, diabetes mellitus, hypertension who presents to the emergency department due to a fall sustained at home and he was too weak to get up.  Patient was unable to provide history of why he came to the ED, his only complaint was bilateral leg swelling.  History was obtained from ED physician and from ED medical record.  Per report, Family member checked on him and found him on the floor (he lives by himself), so he was brought to the ED with concern the patient is unable to live by himself and that he might need a facility care.  1 tablet of Xanax (dose unknown) was given prior to bringing the patient to the ED.  There was no complaint of fever, chills, chest pain, nausea vomiting abdominal pain    PT Comments    Patient demonstrates generalized weakness and decreased functional activity tolerance requiring frequent rest periods between exercises and/or mobility maneuvers due to fatigue/exertion.  Poor standing tolerance requiring seated rest periods every 15-30 sec with O2 sats fluctuating 93-87% with exertion despite supplemental O2.  Patient will benefit from continued physical therapy in hospital and recommended venue below to increase strength, balance, endurance for safe ADLs and gait.     Follow Up Recommendations  SNF     Equipment Recommendations  Rolling walker with 5" wheels    Recommendations for Other Services       Precautions / Restrictions Precautions Precautions: Fall Precaution Comments: Oxygen    Mobility  Bed Mobility Overal bed mobility: Needs Assistance Bed Mobility: Supine to Sit;Sit to Supine     Supine to sit: Supervision;HOB  elevated Sit to supine: Supervision;HOB elevated   General bed mobility comments: increased time  Transfers Overall transfer level: Needs assistance Equipment used: 1 person hand held assist Transfers: Sit to/from [facility] Sit to Stand: Min assist Stand pivot transfers: Min guard       General transfer comment: slow labored movement, difficulty with turning due to LE weakness  Ambulation/Gait                 Stairs             Wheelchair Mobility    Modified Rankin (Stroke Patients Only)       Balance                                            Cognition Arousal/Alertness: Awake/alert Behavior During Therapy: WFL for tasks assessed/performed Overall Cognitive Status: Within Functional Limits for tasks assessed                                        Exercises General Exercises - Lower Extremity Ankle Circles/Pumps: AROM;20 reps;Both Quad Sets: Strengthening;Both;20 reps Gluteal Sets: Strengthening;Both;20 reps Long Arc Quad: AROM;Both;20 reps Heel Slides: AROM;20 reps;Both Hip ABduction/ADduction: Strengthening;20 reps;Both Straight Leg Raises: Strengthening;Both;15 reps    General Comments        Pertinent Vitals/Pain Pain Assessment: No/denies pain    Home Living  Prior Function            PT Goals (current goals can now be found in the care plan section) Acute Rehab PT Goals Patient Stated Goal: return home PT Goal Formulation: With patient Time For Goal Achievement: 04/10/20 Potential to Achieve Goals: Good Progress towards PT goals: Progressing toward goals    Frequency    Min 3X/week      PT Plan Current plan remains appropriate    Co-evaluation              AM-PAC PT "6 Clicks" Mobility   Outcome Measure                   End of Session Equipment Utilized During Treatment: Oxygen Activity Tolerance: Patient tolerated  treatment well;Patient limited by fatigue Patient left: with call bell/phone within reach;in bed;with bed alarm set Nurse Communication: Mobility status PT Visit Diagnosis: Unsteadiness on feet (R26.81);Other abnormalities of gait and mobility (R26.89);Muscle weakness (generalized) (M62.81)     Time: 1610-9604 PT Time Calculation (min) (ACUTE ONLY): 30 min  Charges:  $Therapeutic Exercise: 8-22 mins $Therapeutic Activity: 8-22 mins                     2:53 PM, 03/29/20 M. Sherlyn Lees, PT, DPT Physical Therapist- [place] Office Number: [phone]

## 2020-03-29 NOTE — Progress Notes (Signed)
Occupational Therapy Treatment Patient Details Name: Darrell Christensen MRN: [id number] DOB: Dec 29, 1945 Today's Date: 03/29/2020    History of present illness Darrell Christensen is a 74 y.o. male with medical history significant for stage IV lung adenocarcinoma, followed by Dr. Delton Coombes with known brain mets, CKD, hypertension, hyperlipidemia, diabetes mellitus, hypertension who presents to the emergency department due to a fall sustained at home and he was too weak to get up.  Patient was unable to provide history of why he came to the ED, his only complaint was bilateral leg swelling.  History was obtained from ED physician and from ED medical record.  Per report, Family member checked on him and found him on the floor (he lives by himself), so he was brought to the ED with concern the patient is unable to live by himself and that he might need a facility care.  1 tablet of Xanax (dose unknown) was given prior to bringing the patient to the ED.  There was no complaint of fever, chills, chest pain, nausea vomiting abdominal pain   OT comments  Pt agreeable to OT session this am, requesting assistance to [facility]. Pt performing bed mobility with supervision, transfer to [facility] with min guard. Pt provided with RW however preferred to stand pivot using no DME. Pt performing hygiene tasks with supervision/min guard, lateral and forward lean without LOB. Pt with slight SOB after task, improved with rest. Discharge plan continue to be appropriate.    Follow Up Recommendations  SNF    Equipment Recommendations  None recommended by OT       Precautions / Restrictions Precautions Precautions: Fall Precaution Comments: Oxygen       Mobility Bed Mobility Overal bed mobility: Needs Assistance Bed Mobility: Supine to Sit;Sit to Supine     Supine to sit: Supervision;HOB elevated Sit to supine: Supervision;HOB elevated   General bed mobility comments: increased time  Transfers Overall transfer level: Needs  assistance Equipment used: 1 person hand held assist Transfers: Sit to/from [facility] Sit to Stand: Min assist Stand pivot transfers: Min guard                ADL either performed or assessed with clinical judgement   ADL Overall ADL's : Needs assistance/impaired     Grooming: Set up;Sitting Grooming Details (indicate cue type and reason): provided hand sanitizer after toileting task                 Toilet Transfer: Min Statistician Details (indicate cue type and reason): pt transferring to and from [facility] with min guard, cuing for hand placement Toileting- Clothing Manipulation and Hygiene: Supervision/safety;Min guard;Sitting/lateral lean Toileting - Clothing Manipulation Details (indicate cue type and reason): Pt performing toilet hygiene while seated on BSC, lateral leans, supervision/min guard for safety             Vision   Vision Assessment?: No apparent visual deficits          Cognition Arousal/Alertness: Awake/alert Behavior During Therapy: WFL for tasks assessed/performed Overall Cognitive Status: Within Functional Limits for tasks assessed                                                     Pertinent Vitals/ Pain       Pain Assessment: No/denies pain  Frequency  Min 2X/week        Progress Toward Goals  OT Goals(current goals can now be found in the care plan section)  Progress towards OT goals: Progressing toward goals  Acute Rehab OT Goals Patient Stated Goal: return home OT Goal Formulation: With patient Time For Goal Achievement: 04/10/20 Potential to Achieve Goals: Good ADL Goals Pt Will Perform Upper Body Bathing: with modified independence;sitting Pt Will Perform Lower Body Bathing: with modified independence;sitting/lateral leans;sit to/from stand Pt Will Perform Upper Body Dressing: with modified independence;sitting Pt Will Perform Lower Body  Dressing: with modified independence;sitting/lateral leans;sit to/from stand Pt Will Transfer to Toilet: with modified independence;ambulating;regular height toilet Pt Will Perform Toileting - Clothing Manipulation and hygiene: with modified independence;sitting/lateral leans;sit to/from stand  Plan Discharge plan remains appropriate          End of Session Equipment Utilized During Treatment: Gait belt;Rolling walker;Oxygen  OT Visit Diagnosis: Muscle weakness (generalized) (M62.81);History of falling (Z91.81)   Activity Tolerance Patient tolerated treatment well   Patient Left in bed;with call bell/phone within reach;with bed alarm set   Nurse Communication          Time: [phone] OT Time Calculation (min): 20 min  Charges: OT General Charges $OT Visit: 1 Visit OT Treatments $Self Care/Home Management : 8-22 mins    Guadelupe Sabin, OTR/L  [phone] 03/29/2020, 9:16 AM

## 2020-03-29 NOTE — Progress Notes (Signed)
Progress Note  Patient Name: Darrell Christensen Date of Encounter: 03/29/2020  Primary Cardiologist: Dorris Carnes, MD  Subjective   Intermittent cough, no chest pain.  Inpatient Medications    Scheduled Meds: . Chlorhexidine Gluconate Cloth  6 each Topical Daily  . DULoxetine  60 mg Oral Daily  . feeding supplement (GLUCERNA SHAKE)  237 mL Oral TID BM  . folic acid  1 mg Oral Daily  . furosemide  40 mg Intravenous Q12H  . heparin  5,000 Units Subcutaneous Q8H  . insulin aspart  0-5 Units Subcutaneous QHS  . insulin aspart  0-9 Units Subcutaneous TID WC  . losartan  25 mg Oral Daily  . metoprolol succinate  25 mg Oral BID  . potassium chloride  40 mEq Oral BID  . predniSONE  50 mg Oral Q breakfast  . simvastatin  20 mg Oral QPM   Continuous Infusions: . sodium chloride 500 mL (03/28/20 0839)  . ceFEPime (MAXIPIME) IV 2 g (03/28/20 2214)   PRN Meds: sodium chloride, ALPRAZolam, haloperidol lactate, labetalol, levalbuterol   Vital Signs    Vitals:   03/28/20 1434 03/28/20 1600 03/28/20 2208 03/29/20 0554  BP: 133/82 118/78 108/79 114/63  Pulse: 72 75 72 91  Resp: 18 18 20 20   Temp: 97.9 F (36.6 C) 98 F (36.7 C) 98.1 F (36.7 C) 98.2 F (36.8 C)  TempSrc: Temporal Axillary    SpO2: 96% 98% 99% 97%  Weight:    89.9 kg  Height:        Intake/Output Summary (Last 24 hours) at 03/29/2020 0856 Last data filed at 03/29/2020 0300 Gross per 24 hour  Intake 1108.35 ml  Output 850 ml  Net 258.35 ml   Filed Weights   03/27/20 0700 03/28/20 0253 03/29/20 0554  Weight: 94.2 kg 94.3 kg 89.9 kg    Telemetry    Sinus rhythm with rare ectopy.  Personally reviewed.  ECG    An ECG dated 03/25/2020 was personally reviewed today and demonstrated:  Sinus rhythm with PVCs, IVCD, poor R wave progression.  Physical Exam   GEN:  Chronically ill-appearing male.  No acute distress.   Neck: No JVD. Cardiac: RRR, soft systolic murmur without gallop.  Respiratory:   Rhonchorous breath sounds. GI: Soft, nontender, bowel sounds present. MS: No edema; No deformity.  Labs    Chemistry Recent Labs  Lab 03/24/20 1857 03/24/20 1857 03/25/20 0419 03/26/20 1139 03/27/20 0419 03/28/20 0451 03/29/20 0550  NA 137   < > 139   < > 136 136 135  K 2.8*   < > 3.0*   < > 3.2* 3.3* 3.6  CL 101   < > 108   < > 100 96* 97*  CO2 27   < > 24   < > 25 29 28   GLUCOSE 98   < > 86   < > 92 129* 203*  BUN 17   < > 14   < > 20 21 32*  CREATININE 1.55*   < > 1.32*  1.29*   < > 1.32* 1.36* 1.46*  CALCIUM 7.7*   < > 6.9*   < > 8.2* 8.3* 8.5*  PROT 5.7*  --  5.2*  --   --   --   --   ALBUMIN 2.7*  --  2.3*  --   --   --   --   AST 18  --  15  --   --   --   --  ALT 22  --  19  --   --   --   --   ALKPHOS 50  --  44  --   --   --   --   BILITOT 0.9  --  0.7  --   --   --   --   GFRNONAA 46*   < > 56*  58*   < > 56* 54* 50*  ANIONGAP 9   < > 7   < > 11 11 10    < > = values in this interval not displayed.     Hematology Recent Labs  Lab 03/27/20 0419 03/28/20 0451 03/29/20 0550  WBC 13.3* 14.5* 16.6*  RBC 4.17* 4.05* 4.10*  HGB 12.3* 12.1* 12.1*  HCT 37.8* 36.9* 37.8*  MCV 90.6 91.1 92.2  MCH 29.5 29.9 29.5  MCHC 32.5 32.8 32.0  RDW 15.4 15.8* 15.9*  PLT 260 296 295    BNP Recent Labs  Lab 03/25/20 0419 03/29/20 0550  BNP 454.0* 187.0*     Radiology    No results found.  Cardiac Studies   Echocardiogram 03/25/2020: 1. Left ventricular ejection fraction, by estimation, is 30 to 35%. The  left ventricle has moderately decreased function. The left ventricle  demonstrates global hypokinesis. There is moderate left ventricular  hypertrophy. Left ventricular diastolic  parameters are consistent with Grade I diastolic dysfunction (impaired  relaxation).  2. Right ventricular systolic function was not well visualized. The right  ventricular size is normal.  3. The pericardial effusion is circumferential. There is no evidence of  cardiac  tamponade.  4. The mitral valve is normal in structure. No evidence of mitral valve  regurgitation. No evidence of mitral stenosis.  5. The aortic valve was not well visualized. Aortic valve regurgitation  is trivial. No aortic stenosis is present.  6. The inferior vena cava is normal in size with greater than 50%  respiratory variability, suggesting right atrial pressure of 3 mmHg.   Patient Profile     74 y.o. male with stage IV adenocarcinoma of the lung and brain metastasis, CKD stage III, hypertension, hyperlipidemia, type 2 diabetes mellitus, and newly documented cardiomyopathy with LVEF 30 to 35%.  Assessment & Plan    1.  Newly documented cardiomyopathy with acute systolic heart failure, LVEF 30 to 35% with global hypokinesis and moderate LVH, possibly nonischemic etiology.  Plan is for conservative medical therapy in light of associated comorbidities and poor prognosis.  He has diuresed well and medications are being actively adjusted.   2.  Multifactorial acute hypoxic respiratory failure.  3.  Stage IV adenocarcinoma of the lung with brain metastasis, currently DNR and with palliative care consultation.  4.  CKD stage IIIb, creatinine up to 1.46.  Continue Toprol-XL and losartan.  Will initiate Aldactone 12.5 mg daily, change to low-dose oral Lasix with potassium supplement.  Need to follow BMET to ensure creatinine and electrolytes stabilize.  He is not a candidate for further ischemic work-up or ICD in light of comorbidities and poor prognosis.  Signed, Rozann Lesches, MD  03/29/2020, 8:56 AM

## 2020-03-29 NOTE — TOC Progression Note (Signed)
Transition of Care [facility]) - Progression Note    Patient Details  Name: Darrell Christensen MRN: [id number] Date of Birth: Sep 24, 1945  [facility] CM/SW Contact  Salome Arnt, [place] Phone Number: 03/29/2020, 4:03 PM  Clinical Narrative:  Marlinda Mike updated Ebony Hail on pt this morning. Facility is unable to accept pt on holiday. Pt is okay to return on Friday. He will need repeat COVID test. MD notified.      Expected Discharge Plan: [place] Barriers to Discharge: Continued Medical Work up  Expected Discharge Plan and Services Expected Discharge Plan: [place] arrangements for the past 2 months: Single Family Home                                       Social Determinants of Health (SDOH) Interventions    Readmission Risk Interventions Readmission Risk Prevention Plan 03/27/2020  Transportation Screening Complete  HRI or Home Care Consult Complete  Social Work Consult for [place] Planning/Counseling Complete  Palliative Care Screening Not Applicable  Medication Review Press photographer) Complete  Some recent data might be hidden

## 2020-03-30 DIAGNOSIS — I5021 Acute systolic (congestive) heart failure: Secondary | ICD-10-CM | POA: Diagnosis not present

## 2020-03-30 DIAGNOSIS — I429 Cardiomyopathy, unspecified: Secondary | ICD-10-CM | POA: Diagnosis not present

## 2020-03-30 DIAGNOSIS — J9601 Acute respiratory failure with hypoxia: Secondary | ICD-10-CM | POA: Diagnosis not present

## 2020-03-30 DIAGNOSIS — C7931 Secondary malignant neoplasm of brain: Secondary | ICD-10-CM | POA: Diagnosis not present

## 2020-03-30 LAB — GLUCOSE, CAPILLARY
Glucose-Capillary: 85 mg/dL (ref 70–99)
Glucose-Capillary: 163 mg/dL — ABNORMAL HIGH (ref 70–99)
Glucose-Capillary: 173 mg/dL — ABNORMAL HIGH (ref 70–99)
Glucose-Capillary: 225 mg/dL — ABNORMAL HIGH (ref 70–99)

## 2020-03-30 NOTE — Progress Notes (Signed)
PROGRESS NOTE    JENKINS RISDON  PYP:[id number] DOB: February 14, 1946 DOA: 03/24/2020 PCP: Celene Squibb, MD   Brief Narrative:  Per HPI: Alba Kriesel Cobbis a 74 y.o.malewith medical history significant forstage IV lung adenocarcinoma, followed by Dr. Delton Coombes with known brain mets, CKD, hypertension, hyperlipidemia, diabetes mellitus, hypertensionwho presents to the emergency department due to a fall sustained at home and he was too weak to get up. Patient was unable to provide history of why he came to the ED, his only complaint was bilateral leg swelling. History was obtained from ED physician and from ED medical record. Per report, Family member checked on him and found him on the floor (he lives by himself), so he was brought to the ED with concern the patient is unable to live by himself and that he might need a facility care. 1 tablet of Xanax (dose unknown) was given prior to bringing the patient to the ED. There was no complaint of fever, chills, chest pain, nausea vomiting abdominal pain.  -Patient was admitted with acute hypoxemic respiratory failure in the setting of possible HCAP/aspiration pneumonia with associated sepsis on admission as well as concern for pulmonary edema with possible congestive heart failure. He is noted to be on chronic prednisone which may contribute to the leukocytosis. His primary concern on admission was lower extremity edema and weakness. He was recently discharged from the hospital on 11/13 and was admitted for AKI/dehydration at that time and received IV fluids. His 2D echocardiogram on 11/20 demonstrates a significant reduction in LV function with global hypokinesis and LVEF of 30-35%.Patient does state that he is DNR. Patient is being diuresed and Cardiology is following. Potassium is being supplemented. Plan is for placement to SNF on discharge with weakness and recurrent admissions.  Assessment & Plan:   Active Problems:   Primary cancer of right  upper lobe of lung (HCC)   HTN (hypertension), benign   Diabetes mellitus ([place])   Brain metastasis ([place])   Acute encephalopathy   Acute respiratory failure with hypoxia (HCC)   Generalized weakness   Fall at home, initial encounter   Leukocytosis   Hypokalemia   Hypoalbuminemia   Pulmonary edema   Healthcare-associated pneumonia   Diabetic neuropathy ([place])   Lung cancer metastatic to brain [facility])   Palliative care by specialist   Acute hypoxemic respiratory failure-multifactorial; improving -Currently on 2 L  and without respiratory distress -In setting of HCAP and more likely acute systolic and diastolic CHF exacerbation -Continue on cefepime to complete antibiotic therapy -blood cultures x 2 negative -continue to Wean O2 as tolerated  Acute systolic and diastolic CHF exacerbation-improving -Continue to follow low-sodium diet, strict I's and O's and daily weights. -Will continue to wean oxygen as tolerated. -Per cardiology recommendations there is no plan for further ischemic work-up while inpatient due to overall poor prognosis. -Continue conservative management; patient using Toprol XL and losartan.  Low-dose Aldactone also be started. -Lasix-transition to oral route and will assess urine output and further response. -Negative overall 5.9 L since admission. -Not a candidate for ICD  Acute encephalopathy-improved -CT head with no acute findings and stable atrophy and mets -No hypercapnia noted -Continue constant reorientation  Hypokalemia- -continue to follow electrolytes and further replete as needed.  Stage IV lung adenocarcinoma with brain metastasis -Follows with Dr. Delton Coombes outpatient, chemo recently held due to diarrhea -Patient is DNR -Seen by palliative care with plans to continue ongoing treatment and follow-up in near future.  CKD stage III -last  creatinine 1.46 with baseline near 1.5-1.8 -Continue to monitor closely with use of diuretics and ARB.     Hypertension -Improved and stable currently -Continue to follow vital signs -Medications for acute systolic heart failure has been added we will also continue assisting with BP management.  Peripheral neuropathy -Will continue to use of duloxetine  Type 2 diabetes -Without any significant hyperglycemia currently -Continue to follow sliding scale insulin.  Isolated choking episode -Speech therapy has been contacted -Try to keep patient in upright position while eating and after completing meals -Consistency has been changed to dysphagia 3  Moderate protein calorie malnutrition -Continue the use of feeding supplements -Follow nutritional intake.   DVT prophylaxis:Heparin Code Status:DNR Family Communication:No family at bedside. Disposition Plan: Status is: Inpatient   Dispo: The patient is from:Home Anticipated d/c is to:SNF Anticipated d/c date is: 1-2 days Patient currently is not medically stable to d/c.  Patient needs ongoing diuresis and weaning of oxygen requirements as well as potassium supplementation prior to discharge.  Cardiology assisting with management.  Consultants:  Cardiology  Palliative  Procedures:  See below  Antimicrobials:  Anti-infectives (From admission, onward)   Start     Dose/Rate Route Frequency Ordered Stop   03/29/20 2200  ceFEPIme (MAXIPIME) 2 g in sodium chloride 0.9 % 100 mL IVPB        2 g 200 mL/hr over 30 Minutes Intravenous Every 12 hours 03/29/20 1506 03/29/20 2238   03/25/20 2000  vancomycin (VANCOREADY) IVPB 1500 mg/300 mL  Status:  Discontinued        1,500 mg 150 mL/hr over 120 Minutes Intravenous Every 24 hours 03/24/20 2005 03/25/20 1244   03/25/20 1000  ceFEPIme (MAXIPIME) 2 g in sodium chloride 0.9 % 100 mL IVPB  Status:  Discontinued        2 g 200 mL/hr over 30 Minutes Intravenous Every 12 hours 03/24/20 2005 03/29/20 1506   03/24/20 2200  metroNIDAZOLE  (FLAGYL) tablet 500 mg  Status:  Discontinued        500 mg Oral Every 8 hours 03/24/20 1933 03/25/20 0810   03/24/20 2015  vancomycin (VANCOCIN) IVPB 1000 mg/200 mL premix        1,000 mg 200 mL/hr over 60 Minutes Intravenous  Once 03/24/20 2005 03/24/20 2154   03/24/20 1945  ceFEPIme (MAXIPIME) 2 g in sodium chloride 0.9 % 100 mL IVPB        2 g 200 mL/hr over 30 Minutes Intravenous  Once 03/24/20 1933 03/24/20 2009   03/24/20 1945  vancomycin (VANCOCIN) IVPB 1000 mg/200 mL premix        1,000 mg 200 mL/hr over 60 Minutes Intravenous  Once 03/24/20 1933 03/24/20 2048      Subjective: No fever, no chest pain, no nausea or vomiting.  Reports breathing is a stable.  Patient is using 2 L nasal cannula supplementation and expressing shortness of breath with activity.  No further choking episodes has been reported after diet adjusted to dysphagia 3 with thin liquids.  Objective: Vitals:   03/29/20 1400 03/29/20 2037 03/30/20 0440 03/30/20 0500  BP: 127/78 (!) 116/94 124/80   Pulse: 67 93 68   Resp: 20 19 18    Temp: 97.7 F (36.5 C) 98.2 F (36.8 C) 97.9 F (36.6 C)   TempSrc: Oral     SpO2: 95% 96% 98%   Weight:    90 kg  Height:        Intake/Output Summary (Last 24 hours) at 03/30/2020 1655  Last data filed at 03/30/2020 0948 Gross per 24 hour  Intake 436.79 ml  Output --  Net 436.79 ml   Filed Weights   03/28/20 0253 03/29/20 0554 03/30/20 0500  Weight: 94.3 kg 89.9 kg 90 kg    Examination: General exam: Alert, awake and afebrile.  Continue to use 2 L nasal cannula supplementation.  Experiencing intermittent productive coughing spells and having shortness of breath on exertion.  Patient expressed feeling weak and tired. Respiratory system: Positive rhonchi bilaterally; no using accessory muscles, no crackles. Cardiovascular system: Rate controlled, no rubs, no gallops, no JVD. Gastrointestinal system: Abdomen is nondistended, soft and nontender. No organomegaly or  masses felt. Normal bowel sounds heard. Central nervous system: Alert and oriented. No focal neurological deficits. Extremities: No cyanosis or clubbing. Skin: No petechiae. Psychiatry: No suicidal ideation or hallucinations; flat affect appreciated.   Data Reviewed: I have personally reviewed following labs and imaging studies  CBC: Recent Labs  Lab 03/24/20 1857 03/24/20 1857 03/25/20 0419 03/26/20 1139 03/27/20 0419 03/28/20 0451 03/29/20 0550  WBC 15.8*   < > 12.3* 11.5* 13.3* 14.5* 16.6*  NEUTROABS 11.0*  --   --   --   --   --   --   HGB 11.7*   < > 11.4* 11.4* 12.3* 12.1* 12.1*  HCT 35.7*   < > 34.9* 34.3* 37.8* 36.9* 37.8*  MCV 91.3   < > 93.1 91.0 90.6 91.1 92.2  PLT 238   < > 233 218 260 296 295   < > = values in this interval not displayed.   Basic Metabolic Panel: Recent Labs  Lab 03/25/20 0419 03/26/20 1139 03/27/20 0419 03/28/20 0451 03/29/20 0550  NA 139 134* 136 136 135  K 3.0* 3.0* 3.2* 3.3* 3.6  CL 108 98 100 96* 97*  CO2 24 27 25 29 28   GLUCOSE 86 192* 92 129* 203*  BUN 14 16 20 21  32*  CREATININE 1.32*  1.29* 1.39* 1.32* 1.36* 1.46*  CALCIUM 6.9* 7.3* 8.2* 8.3* 8.5*  MG 1.7 1.6* 2.1 1.9 1.8  PHOS 2.1*  --   --   --   --    GFR: Estimated Creatinine Clearance: 46.6 mL/min (A) (by C-G formula based on SCr of 1.46 mg/dL (H)).   Liver Function Tests: Recent Labs  Lab 03/24/20 1857 03/25/20 0419  AST 18 15  ALT 22 19  ALKPHOS 50 44  BILITOT 0.9 0.7  PROT 5.7* 5.2*  ALBUMIN 2.7* 2.3*   No results for input(s): LIPASE, AMYLASE in the last 168 hours.   Recent Labs  Lab 03/26/20 1139  AMMONIA 10   Coagulation Profile: Recent Labs  Lab 03/24/20 1857 03/25/20 0419  INR 1.3* 1.2   Cardiac Enzymes: Recent Labs  Lab 03/25/20 0920  CKTOTAL 114   CBG: Recent Labs  Lab 03/28/20 2154 03/29/20 0755 03/29/20 1142 03/29/20 1700 03/29/20 2039  GLUCAP 161* 94 133* 299* 156*   Lipid Profile: No results for input(s): CHOL, HDL,  LDLCALC, TRIG, CHOLHDL, LDLDIRECT in the last 72 hours. Sepsis Labs: Recent Labs  Lab 03/24/20 1857 03/24/20 2059 03/25/20 0920  PROCALCITON  --   --  0.69  LATICACIDVEN 1.8 1.3  --     Recent Results (from the past 240 hour(s))  Culture, blood (Routine x 2)     Status: None   Collection Time: 03/24/20  6:57 PM   Specimen: Left Antecubital; Blood  Result Value Ref Range Status   Specimen Description LEFT ANTECUBITAL  Final   Special Requests   Final    BOTTLES DRAWN AEROBIC AND ANAEROBIC Blood Culture results may not be optimal due to an inadequate volume of blood received in culture bottles   Culture   Final    NO GROWTH 5 DAYS Performed at [facility], [address], [place], [place] [address]    Report Status 03/29/2020 FINAL  Final  Culture, blood (Routine x 2)     Status: None   Collection Time: 03/24/20  6:57 PM   Specimen: Right Antecubital; Blood  Result Value Ref Range Status   Specimen Description RIGHT ANTECUBITAL  Final   Special Requests   Final    BOTTLES DRAWN AEROBIC AND ANAEROBIC Blood Culture results may not be optimal due to an inadequate volume of blood received in culture bottles   Culture   Final    NO GROWTH 5 DAYS Performed at [facility], [address], [place], [place] [address]    Report Status 03/29/2020 FINAL  Final  Resp Panel by RT-PCR (Flu A&B, Covid) Nasopharyngeal Swab     Status: None   Collection Time: 03/24/20  6:58 PM   Specimen: Nasopharyngeal Swab; Nasopharyngeal(NP) swabs in vial transport medium  Result Value Ref Range Status   SARS Coronavirus 2 by RT PCR NEGATIVE NEGATIVE Final    Comment: (NOTE) SARS-CoV-2 target nucleic acids are NOT DETECTED.  The SARS-CoV-2 RNA is generally detectable in upper respiratory specimens during the acute phase of infection. The lowest concentration of SARS-CoV-2 viral copies this assay can detect is 138 copies/mL. A negative result does not preclude SARS-Cov-2 infection and should not be used  as the sole basis for treatment or other patient management decisions. A negative result may occur with  improper specimen collection/handling, submission of specimen other than nasopharyngeal swab, presence of viral mutation(s) within the areas targeted by this assay, and inadequate number of viral copies(<138 copies/mL). A negative result must be combined with clinical observations, patient history, and epidemiological information. The expected result is Negative.  Fact Sheet for Patients:  EntrepreneurPulse.com.au  Fact Sheet for Healthcare Providers:  IncredibleEmployment.be  This test is no t yet approved or cleared by the [place] FDA and  has been authorized for detection and/or diagnosis of SARS-CoV-2 by FDA under an Emergency Use Authorization (EUA). This EUA will remain  in effect (meaning this test can be used) for the duration of the COVID-19 declaration under Section 564(b)(1) of the Act, 21 U.S.C.section 360bbb-3(b)(1), unless the authorization is terminated  or revoked sooner.       Influenza A by PCR NEGATIVE NEGATIVE Final   Influenza B by PCR NEGATIVE NEGATIVE Final    Comment: (NOTE) The Xpert Xpress SARS-CoV-2/FLU/RSV plus assay is intended as an aid in the diagnosis of influenza from Nasopharyngeal swab specimens and should not be used as a sole basis for treatment. Nasal washings and aspirates are unacceptable for Xpert Xpress SARS-CoV-2/FLU/RSV testing.  Fact Sheet for Patients: EntrepreneurPulse.com.au  Fact Sheet for Healthcare Providers: IncredibleEmployment.be  This test is not yet approved or cleared by the [place] FDA and has been authorized for detection and/or diagnosis of SARS-CoV-2 by FDA under an Emergency Use Authorization (EUA). This EUA will remain in effect (meaning this test can be used) for the duration of the COVID-19 declaration under Section 564(b)(1)  of the Act, 21 U.S.C. section 360bbb-3(b)(1), unless the authorization is terminated or revoked.  Performed at [facility], [address] [place], [place] [address]   MRSA  PCR Screening     Status: None   Collection Time: 03/25/20  5:12 AM   Specimen: Nasal Mucosa; Nasopharyngeal  Result Value Ref Range Status   MRSA by PCR NEGATIVE NEGATIVE Final    Comment:        The GeneXpert MRSA Assay (FDA approved for NASAL specimens only), is one component of a comprehensive MRSA colonization surveillance program. It is not intended to diagnose MRSA infection nor to guide or monitor treatment for MRSA infections. Performed at [facility], [address], [place], [place] [address]      Scheduled Meds: . Chlorhexidine Gluconate Cloth  6 each Topical Daily  . DULoxetine  60 mg Oral Daily  . feeding supplement (GLUCERNA SHAKE)  237 mL Oral TID BM  . folic acid  1 mg Oral Daily  . furosemide  40 mg Oral Daily  . heparin  5,000 Units Subcutaneous Q8H  . insulin aspart  0-5 Units Subcutaneous QHS  . insulin aspart  0-9 Units Subcutaneous TID WC  . losartan  25 mg Oral Daily  . metoprolol succinate  25 mg Oral BID  . potassium chloride  20 mEq Oral Daily  . predniSONE  50 mg Oral Q breakfast  . simvastatin  20 mg Oral QPM  . spironolactone  12.5 mg Oral Daily   Continuous Infusions: . sodium chloride 500 mL (03/28/20 0839)     LOS: 5 days    Time spent: 30 minutes    Barton Dubois, MD [facility]  If 7PM-7AM, please contact night-coverage [email or web address] 03/30/2020, 4:55 PM

## 2020-03-30 NOTE — Plan of Care (Signed)
  Problem: SLP Dysphagia Goals Goal: Patient will utilize recommended strategies Description: Patient will utilize recommended strategies during swallow to increase swallowing safety with Flowsheets (Taken 03/30/2020 1136) Patient will utilize recommended strategies during swallow to increase swallowing safety with: modified independence   Thank you,  Genene Churn, [place]

## 2020-03-30 NOTE — Evaluation (Signed)
Clinical/Bedside Swallow Evaluation Patient Details  Name: Darrell Christensen MRN: [id number] Date of Birth: Jul 14, 1945  Today's Date: 03/30/2020 Time: SLP Start Time (ACUTE ONLY): 32 SLP Stop Time (ACUTE ONLY): 1038 SLP Time Calculation (min) (ACUTE ONLY): 18 min  Past Medical History:  Past Medical History:  Diagnosis Date  . Adrenal mass, left ([place]) 04/01/2016  . Diabetes mellitus ([place]) 05/02/2016  . Diabetes mellitus without complication ([place])   . Hypertension   . Mass of breast, right 04/01/2016  . Mass of upper lobe of right lung 04/01/2016   2 adjacent, 5 cm masses, hilar adenopathy  . Primary cancer of right upper lobe of lung ([place]) 04/01/2016   2 adjacent, 5 cm masses, hilar adenopathy  . Pulmonary embolus ([place]) 03/31/2016  . 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 COLONOSCOPY N/A 07/28/2017   Procedure: COLONOSCOPY;  Surgeon: Rogene Houston, MD;  Location: [facility] ENDO SUITE;  Service: Endoscopy;  Laterality: N/A;  205  . PORTACATH PLACEMENT Left 05/13/2016   Procedure: INSERTION PORT-A-CATH;  Surgeon: Aviva Signs, MD;  Location: [facility] ORS;  Service: General;  Laterality: Left;   HPI:  Darrell Christensen is a 74 y.o. male with medical history significant for stage IV lung adenocarcinoma, followed by Dr. Delton Coombes with known brain mets, CKD, hypertension, hyperlipidemia, diabetes mellitus, hypertension who presents to the emergency department due to a fall sustained at home and he was too weak to get up.  Patient was unable to provide history of why he came to the ED, his only complaint was bilateral leg swelling.  History was obtained from ED physician and from ED medical record.  Per report, Family member checked on him and found him on the floor (he lives by himself), so he was brought to the ED with concern the patient is unable to live by himself and that he might need a facility care. Patient was admitted with acute  hypoxemic respiratory failure in the setting of possible HCAP/aspiration pneumonia with associated sepsis on admission as well as concern for pulmonary edema with possible congestive heart failure.  He is noted to be on chronic prednisone which may contribute to the leukocytosis.  His primary concern on admission was lower extremity edema and weakness.  He was recently discharged from the hospital on 11/13 and was admitted for AKI/dehydration at that time and received IV fluids. His 2D echocardiogram on 11/20 demonstrates a significant reduction in LV function with global hypokinesis and LVEF of 30-35%. Patient does state that he is DNR.  Patient is being diuresed and Cardiology is following. Potassium is being supplemented.  Plan is for placement to SNF on discharge with weakness and recurrent admissions. BSE requested as Pt's daughter reported choking with lunch meal yesterday.   Assessment / Plan / Recommendation Clinical Impression  Clinical swallow evaluation completed at bedside. Pt with limited interaction and seemed annoyed for the visit (avoiding eye contact, denials, limited participation). He denies difficulty swallowing and does not recall "getting choked" yesterday at lunch. He does indicate that he has limited dentition and this sometimes impacts mastication of solids. Oral motor examination is WNL. Pt consumed ice chips, thin water via cup/straw, puree, and regular textures. Pt with one immediate cough after straw sips thin on 1 of 3 trials. He was cued to take small sips. No significant oral residuals. Pt with congested cough, difficult to discern if related to po intake. Recommend continuing diet as  ordered (D3/mech soft and thin) and will plan for MBSS tomorrow to objectively evaluate swallow as plan is for SNF, will see if radiology can accommodate.    SLP Visit Diagnosis: Dysphagia, unspecified (R13.10)    Aspiration Risk  Mild aspiration risk    Diet Recommendation Dysphagia 3 (Mech  soft);Thin liquid   Liquid Administration via: Cup Medication Administration: Whole meds with liquid Supervision: Patient able to self feed;Intermittent supervision to cue for compensatory strategies Compensations: Slow rate;Small sips/bites Postural Changes: Seated upright at 90 degrees;Remain upright for at least 30 minutes after po intake    Other  Recommendations Oral Care Recommendations: Oral care BID;Staff/trained caregiver to provide oral care Other Recommendations: Clarify dietary restrictions   Follow up Recommendations  (pending)      Frequency and Duration min 2x/week  1 week       Prognosis Prognosis for Safe Diet Advancement: Fair Barriers to Reach Goals: Motivation      Swallow Study   General Date of Onset: 03/24/20 HPI: Darrell Christensen is a 74 y.o. male with medical history significant for stage IV lung adenocarcinoma, followed by Dr. Delton Coombes with known brain mets, CKD, hypertension, hyperlipidemia, diabetes mellitus, hypertension who presents to the emergency department due to a fall sustained at home and he was too weak to get up.  Patient was unable to provide history of why he came to the ED, his only complaint was bilateral leg swelling.  History was obtained from ED physician and from ED medical record.  Per report, Family member checked on him and found him on the floor (he lives by himself), so he was brought to the ED with concern the patient is unable to live by himself and that he might need a facility care. Patient was admitted with acute hypoxemic respiratory failure in the setting of possible HCAP/aspiration pneumonia with associated sepsis on admission as well as concern for pulmonary edema with possible congestive heart failure.  He is noted to be on chronic prednisone which may contribute to the leukocytosis.  His primary concern on admission was lower extremity edema and weakness.  He was recently discharged from the hospital on 11/13 and was admitted for  AKI/dehydration at that time and received IV fluids. His 2D echocardiogram on 11/20 demonstrates a significant reduction in LV function with global hypokinesis and LVEF of 30-35%. Patient does state that he is DNR.  Patient is being diuresed and Cardiology is following. Potassium is being supplemented.  Plan is for placement to SNF on discharge with weakness and recurrent admissions. BSE requested as Pt's daughter reported choking with lunch meal yesterday. Type of Study: Bedside Swallow Evaluation Previous Swallow Assessment: None on record Diet Prior to this Study: Dysphagia 3 (soft);Thin liquids Temperature Spikes Noted: No Respiratory Status: Nasal cannula History of Recent Intubation: No Behavior/Cognition: Alert;Cooperative Oral Cavity Assessment: Within Functional Limits Oral Care Completed by SLP: No Oral Cavity - Dentition: Missing dentition;Adequate natural dentition Vision: Functional for self-feeding Self-Feeding Abilities: Able to feed self Patient Positioning: Upright in bed Baseline Vocal Quality: Normal (congested) Volitional Cough: Weak;Congested Volitional Swallow: Able to elicit    Oral/Motor/Sensory Function Overall Oral Motor/Sensory Function: Within functional limits   Ice Chips Ice chips: Within functional limits Presentation: Spoon   Thin Liquid Thin Liquid: Impaired Presentation: Cup;Self Fed;Straw Pharyngeal  Phase Impairments: Cough - Immediate (x1 with straw sips)    Nectar Thick Nectar Thick Liquid: Not tested   Honey Thick Honey Thick Liquid: Not tested   Puree Puree: Within  functional limits Presentation: Spoon   Solid     Solid: Within functional limits Presentation: Self Fed     Thank you,  Genene Churn, [place]  Dianelys Scinto 03/30/2020,11:28 AM

## 2020-03-31 ENCOUNTER — Inpatient Hospital Stay (HOSPITAL_COMMUNITY): Payer: Medicare Other

## 2020-03-31 DIAGNOSIS — I5021 Acute systolic (congestive) heart failure: Secondary | ICD-10-CM

## 2020-03-31 DIAGNOSIS — R0902 Hypoxemia: Secondary | ICD-10-CM | POA: Diagnosis not present

## 2020-03-31 DIAGNOSIS — C7931 Secondary malignant neoplasm of brain: Secondary | ICD-10-CM | POA: Diagnosis not present

## 2020-03-31 DIAGNOSIS — J9601 Acute respiratory failure with hypoxia: Secondary | ICD-10-CM | POA: Diagnosis not present

## 2020-03-31 DIAGNOSIS — R531 Weakness: Secondary | ICD-10-CM | POA: Diagnosis not present

## 2020-03-31 LAB — BASIC METABOLIC PANEL
Anion gap: 7 (ref 5–15)
BUN: 30 mg/dL — ABNORMAL HIGH (ref 8–23)
CO2: 22 mmol/L (ref 22–32)
Calcium: 6.6 mg/dL — ABNORMAL LOW (ref 8.9–10.3)
Chloride: 106 mmol/L (ref 98–111)
Creatinine, Ser: 1.09 mg/dL (ref 0.61–1.24)
GFR, Estimated: >60 mL/min (ref >60)
Glucose, Bld: 143 mg/dL — ABNORMAL HIGH (ref 70–99)
Potassium: 2.9 mmol/L — ABNORMAL LOW (ref 3.5–5.1)
Sodium: 135 mmol/L (ref 135–145)

## 2020-03-31 LAB — GLUCOSE, CAPILLARY
Glucose-Capillary: 79 mg/dL (ref 70–99)
Glucose-Capillary: 123 mg/dL — ABNORMAL HIGH (ref 70–99)

## 2020-03-31 LAB — SARS CORONAVIRUS 2 BY RT PCR (HOSPITAL ORDER, PERFORMED IN ~~LOC~~ HOSPITAL LAB): SARS Coronavirus 2: NEGATIVE

## 2020-03-31 MED ORDER — GLUCERNA SHAKE PO LIQD: 237.0000 mL | Freq: Three times a day (TID) | ORAL | Status: AC

## 2020-03-31 MED ORDER — HEPARIN SOD (PORK) LOCK FLUSH 100 UNIT/ML IV SOLN
500.0000 [IU] | Freq: Once | INTRAVENOUS | Status: DC
  Filled 2020-03-31: qty 5

## 2020-03-31 MED ORDER — SPIRONOLACTONE 25 MG PO TABS: 12.5000 mg | ORAL_TABLET | Freq: Every day | ORAL | Status: AC

## 2020-03-31 MED ORDER — POTASSIUM CHLORIDE CRYS ER 10 MEQ PO TBCR: 20.0000 meq | EXTENDED_RELEASE_TABLET | Freq: Every day | ORAL | Status: AC

## 2020-03-31 MED ORDER — METOPROLOL SUCCINATE ER 25 MG PO TB24: 25.0000 mg | ORAL_TABLET | Freq: Two times a day (BID) | ORAL | Status: AC

## 2020-03-31 MED ORDER — ALPRAZOLAM 1 MG PO TABS: 1.0000 mg | ORAL_TABLET | Freq: Two times a day (BID) | ORAL | 0 refills | Status: AC | PRN

## 2020-03-31 MED ORDER — FUROSEMIDE 40 MG PO TABS: 40.0000 mg | ORAL_TABLET | Freq: Every day | ORAL | Status: AC

## 2020-03-31 MED ORDER — LOSARTAN POTASSIUM 25 MG PO TABS: 25.0000 mg | ORAL_TABLET | Freq: Every day | ORAL | Status: AC

## 2020-03-31 MED ORDER — OXYCODONE-ACETAMINOPHEN 10-325 MG PO TABS: 1.0000 | ORAL_TABLET | Freq: Three times a day (TID) | ORAL | 0 refills | Status: AC | PRN

## 2020-03-31 NOTE — Progress Notes (Addendum)
Report called to [facility] LPN at [place].  All questions answered. AVS placed in discharge packet.  All patient belongings at bedside. Will deaccess port before discharge. Pt resting comfortably at this time.  Awaiting transport.

## 2020-03-31 NOTE — TOC Transition Note (Signed)
Transition of Care [facility]) - CM/SW Discharge Note   Patient Details  Name: Darrell Christensen MRN: [id number] Date of Birth: Nov 19, 1945  Transition of Care [facility]) CM/SW Contact:  Boneta Lucks, RN Phone Number: 03/31/2020, 10:23 AM   Clinical Narrative:   Patient medically ready to discharge to [facility]. TOC called Anderson Malta, grand daughter and daughter [name]. [name] will take vaccine card to [facility]. TOC will send Clinicals in the hub. RN/MD aware patient needs COVID test.     Final next level of care: Skilled Nursing Facility Barriers to Discharge: Barriers Resolved   Patient Goals and CMS Choice Patient states their goals for this hospitalization and ongoing recovery are:: to go to SNF then home. CMS Medicare.gov Compare Post Acute Care list provided to:: Patient Choice offered to / list presented to : Patient  Discharge Placement              Patient chooses bed at: [facility] Patient to be transferred to facility by: EMS Name of family member notified: Diamond Nickel Patient and family notified of of transfer: 03/31/20     Readmission Risk Interventions Readmission Risk Prevention Plan 03/27/2020  Transportation Screening Complete  HRI or Home Care Consult Complete  Social Work Consult for [place] Planning/Counseling Complete  Palliative Care Screening Not Applicable  Medication Review Press photographer) Complete  Some recent data might be hidden

## 2020-03-31 NOTE — Progress Notes (Addendum)
Left chest port deaccessed by Shelda Pal. Heparin locked. Dressing applied. Pt tolerated well.

## 2020-03-31 NOTE — Progress Notes (Signed)
Physical Therapy Treatment Patient Details Name: Darrell Christensen MRN: [id number] DOB: 03-Feb-1946 Today's Date: 03/31/2020    History of Present Illness Darrell Christensen is a 74 y.o. male with medical history significant for stage IV lung adenocarcinoma, followed by Dr. Delton Coombes with known brain mets, CKD, hypertension, hyperlipidemia, diabetes mellitus, hypertension who presents to the emergency department due to a fall sustained at home and he was too weak to get up.  Patient was unable to provide history of why he came to the ED, his only complaint was bilateral leg swelling.  History was obtained from ED physician and from ED medical record.  Per report, Family member checked on him and found him on the floor (he lives by himself), so he was brought to the ED with concern the patient is unable to live by himself and that he might need a facility care.  1 tablet of Xanax (dose unknown) was given prior to bringing the patient to the ED.  There was no complaint of fever, chills, chest pain, nausea vomiting abdominal pain    PT Comments    Patient demonstrates improved activity participation today and tolerating several times of standing activities/ambulation for short distances.  Patient had notable episodes of LOB when negotiating turns during transfer/ambulation requiring physical assistance/support to correct.  Continues to be limited by generalized weakness and reduced activity tolerance requiring frequent rest periods.  Patient will benefit from continued physical therapy in hospital and recommended venue below to increase strength, balance, endurance for safe ADLs and gait.   Follow Up Recommendations  SNF     Equipment Recommendations  Rolling walker with 5" wheels    Recommendations for Other Services       Precautions / Restrictions Precautions Precautions: Fall Precaution Comments: Oxygen    Mobility  Bed Mobility Overal bed mobility: Needs Assistance Bed Mobility: Supine to  Sit;Sit to Supine     Supine to sit: Supervision;HOB elevated Sit to supine: Supervision;HOB elevated   General bed mobility comments: increased time  Transfers Overall transfer level: Needs assistance Equipment used: Rolling walker (2 wheeled) Transfers: Sit to/from [facility] Sit to Stand: Min assist Stand pivot transfers: Min assist       General transfer comment: slow labored movement, difficulty with turning due to LE weakness  Ambulation/Gait Ambulation/Gait assistance: Min assist Gait Distance (Feet): 10 Feet Assistive device: Rolling walker (2 wheeled) Gait Pattern/deviations: Decreased step length - right;Decreased step length - left;Decreased stride length     General Gait Details: LOB when negtotiating turns   Marine scientist [place] (Stroke Patients Only)       Balance                                            Cognition Arousal/Alertness: Awake/alert Behavior During Therapy: WFL for tasks assessed/performed Overall Cognitive Status: Within Functional Limits for tasks assessed                                        Exercises General Exercises - Lower Extremity Ankle Circles/Pumps: AROM;20 reps;Both Quad Sets: Strengthening;Both;20 reps Gluteal Sets: Strengthening;Both;20 reps Long Arc Quad: AROM;Both;20 reps Heel Slides: AROM;20 reps;Both Hip ABduction/ADduction: Strengthening;20 reps;Both Straight  Leg Raises: Strengthening;Both;15 reps Toe Raises: Strengthening;Both;20 reps Heel Raises: Strengthening;Both;20 reps    General Comments        Pertinent Vitals/Pain Pain Assessment: No/denies pain    Home Living                      Prior Function            PT Goals (current goals can now be found in the care plan section) Acute Rehab PT Goals Patient Stated Goal: return home PT Goal Formulation: With patient Time For Goal  Achievement: 04/10/20 Potential to Achieve Goals: Good Progress towards PT goals: Progressing toward goals    Frequency    Min 3X/week      PT Plan Current plan remains appropriate    Co-evaluation              AM-PAC PT "6 Clicks" Mobility   Outcome Measure  Help needed turning from your back to your side while in a flat bed without using bedrails?: A Little Help needed moving from lying on your back to sitting on the side of a flat bed without using bedrails?: A Little Help needed moving to and from a bed to a chair (including a wheelchair)?: A Little Help needed standing up from a chair using your arms (e.g., wheelchair or bedside chair)?: A Little Help needed to walk in hospital room?: A Lot Help needed climbing 3-5 steps with a railing? : Total 6 Click Score: 15    End of Session Equipment Utilized During Treatment: Oxygen Activity Tolerance: Patient tolerated treatment well;Patient limited by fatigue Patient left: with call bell/phone within reach;in chair Nurse Communication: Mobility status;Precautions PT Visit Diagnosis: Unsteadiness on feet (R26.81);Other abnormalities of gait and mobility (R26.89);Muscle weakness (generalized) (M62.81)     Time: 4818-5631 PT Time Calculation (min) (ACUTE ONLY): 36 min  Charges:  $Gait Training: 8-22 mins $Therapeutic Exercise: 8-22 mins $Therapeutic Activity: 8-22 mins                     10:29 AM, 03/31/20 M. Sherlyn Lees, PT, DPT Physical Therapist- [place] Office Number: [phone]

## 2020-03-31 NOTE — Discharge Summary (Addendum)
Physician Discharge Summary  Darrell Christensen RSW:[id number] DOB: Jan 11, 1946 DOA: 03/24/2020  PCP: Celene Squibb, MD  Admit date: 03/24/2020 Discharge date: 03/31/2020  Time spent: 35 minutes  Recommendations for Outpatient Follow-up:  1. Repeat basic metabolic panel in 1 week to follow renal function 2. Continue closely monitoring patient blood pressure and further adjust antihypertensive management as needed.  Discharge Diagnoses:  Active Problems:   Primary cancer of right upper lobe of lung (HCC)   HTN (hypertension), benign   Diabetes mellitus ([place])   Brain metastasis ([place])   Acute encephalopathy   Acute respiratory failure with hypoxia (HCC)   Generalized weakness   Fall at home, initial encounter   Leukocytosis   Hypokalemia   Hypoalbuminemia   Pulmonary edema   Healthcare-associated pneumonia   Diabetic neuropathy ([place])   Lung cancer metastatic to brain [facility])   Palliative care by specialist   Hypoxia   Acute systolic HF (heart failure) ([place]) Severe sepsis rule in and POA  Discharge Condition: Stable and improved.  Discharged to skilled nursing facility for further care and rehabilitation.  Outpatient follow-up with PCP and cardiology has been requested.  CODE STATUS: DNR  Diet recommendation: Modified carbohydrate and heart healthy diet (dysphagia 3 with thin liquids).  Filed Weights   03/29/20 0554 03/30/20 0500 03/31/20 0500  Weight: 89.9 kg 90 kg 88.8 kg    History of present illness:  Darrell Christensen a 74 y.o.malewith medical history significant forstage IV lung adenocarcinoma, followed by Dr. Delton Coombes with known brain mets, CKD, hypertension, hyperlipidemia, diabetes mellitus, hypertensionwho presents to the emergency department due to a fall sustained at home and he was too weak to get up. Patient was unable to provide history of why he came to the ED, his only complaint was bilateral leg swelling. History was obtained from ED physician and from ED  medical record. Per report, Family member checked on him and found him on the floor (he lives by himself), so he was brought to the ED with concern the patient is unable to live by himself and that he might need a facility care. 1 tablet of Xanax (dose unknown) was given prior to bringing the patient to the ED. There was no complaint of fever, chills, chest pain, nausea vomiting abdominal pain.  -Patient was admitted with acute hypoxemic respiratory failure in the setting of possible HCAP/aspiration pneumonia with associated sepsis on admission as well as concern for pulmonary edema with possible congestive heart failure. He is noted to be on chronic prednisone which may contribute to the leukocytosis. His primary concern on admission was lower extremity edema and weakness. He was recently discharged from the hospital on 11/13 and was admitted for AKI/dehydration at that time and received IV fluids. His 2D echocardiogram on 11/20 demonstrates a significant reduction in LV function with global hypokinesis and LVEF of 30-35%.Patient does state that he is DNR. Patient is being diuresed and Cardiology is following. Potassium is being supplemented. Plan is for placement to SNF on discharge with weakness and recurrent admissions.  Hospital Course:  Severe sepsis with acute hypoxemic respiratory failure-multifactorial; improving -sepsis rule in and POA. -Currently on2 L [place] and without respiratory distress -In setting of HCAP and more likely acute systolic and diastolic CHF exacerbation -Patient completed antibiotic treatment while inpatient. -blood cultures x 2 negative -continue to slowly wean off O2 as tolerated if possible.  Acute systolic and diastolic CHF exacerbation-improving -Continue to follow low-sodium diet, strict I's and O's and daily weights. -Will  continue to wean oxygen as tolerated. -Per cardiology recommendations there is no plan for further ischemic work-up while inpatient  due to overall poor prognosis. -Following cardiology service recommendation patient will continue conservative management; using: Toprol XL and losartan.  Low-dose Aldactone also be started. -Lasix-transition to oral route and good urine output appreciated. -Negative overall 6.2 L since admission. -Not a candidate for ICD  Acute encephalopathy-improved/resolved. -CT head with no acute findings and stable atrophy and mets -No hypercapnia noted -Continue constant reorientation and supportive care.  Hypokalemia- -continue to follow electrolytes and further replete as needed.  Stage IV lung adenocarcinoma with brain metastasis -Follows with Dr. Delton Coombes outpatient, chemo recently held due to diarrhea -Patient is DNR -Seen by palliative care with plans to continue ongoing treatment and follow-up in near future.  CKD stage III -last creatinine 1.09 prior to discharge -Renal function much better than previous baseline currently -Advised to maintain adequate hydration -The use of nephrotoxic agents.   -Close monitoring recommended, as patient has been started on ARB, spironolactone and Lasix.    Hypertension -Improved and stable currently -Continue to follow heart healthy diet. -Medications for acute systolic heart failure has been added and will also continue assisting with BP management.  Peripheral neuropathy -Will continue to use of duloxetine -No suicidal ideation hallucination.  Type 2 diabetes -Without any significant hyperglycemia currently -Resume home hypoglycemic regimen. -Continue to follow modified carbohydrate diet.  Isolated choking episode -Speech therapy has been contacted and bed recommendations patient's diet consistency has been adjusted to dysphagia 3 with thin liquids. -No further episode of choking events since diet modification.  Moderate protein calorie malnutrition -Continue the use of feeding supplements -Follow nutritional  intake.   Procedures: See below for x-ray reports 2D echo: 1. Left ventricular ejection fraction, by estimation, is 30 to 35%. The  left ventricle has moderately decreased function. The left ventricle  demonstrates global hypokinesis. There is moderate left ventricular  hypertrophy. Left ventricular diastolic  parameters are consistent with Grade I diastolic dysfunction (impaired  relaxation).  2. Right ventricular systolic function was not well visualized. The right  ventricular size is normal.  3. The pericardial effusion is circumferential. There is no evidence of  cardiac tamponade.  4. The mitral valve is normal in structure. No evidence of mitral valve  regurgitation. No evidence of mitral stenosis.  5. The aortic valve was not well visualized. Aortic valve regurgitation  is trivial. No aortic stenosis is present.  6. The inferior vena cava is normal in size with greater than 50%  respiratory variability, suggesting right atrial pressure of 3 mmHg.   Consultations:  Cardiology service  Discharge Exam: Vitals:   03/31/20 0621 03/31/20 0926  BP: 129/81 (!) 117/92  Pulse: 72 90  Resp: 20   Temp: 97.8 F (36.6 C)   SpO2: 96%     General: Afebrile, no chest pain, no nausea, no vomiting.  Using 2 L nasal cannula supplementation with good saturation.  Reports good urine output and is ready to go for rehab. Cardiovascular: Rate controlled, no rubs, no gallops, no JVD on exam. Respiratory: Improved air movement bilaterally, no using accessory muscles; able to speak in full sentences.  Still with positive scattered rhonchi are appreciated on exam. Abdomen: Soft, nontender, nontender, positive bowel sounds Extremities: No cyanosis or clubbing.  Discharge Instructions   Discharge Instructions    (HEART FAILURE PATIENTS) Call MD:  Anytime you have any of the following symptoms: 1) 3 pound weight gain in 24 hours or  5 pounds in 1 week 2) shortness of breath, with or  without a dry hacking cough 3) swelling in the hands, feet or stomach 4) if you have to sleep on extra pillows at night in order to breathe.   Complete by: As directed    Diet - low sodium heart healthy   Complete by: As directed    Discharge instructions   Complete by: As directed    Follow low-sodium diet (less than 2 g daily) Maintain adequate hydration Diet consistency (dysphagia 3 with thin liquids) Check weight on daily basis Repeat basic metabolic panel in 1 week to follow electrolytes and renal function.   Increase activity slowly   Complete by: As directed      Allergies as of 03/31/2020   No Known Allergies     Medication List    STOP taking these medications   amLODipine 2.5 MG tablet Commonly known as: NORVASC   torsemide 20 MG tablet Commonly known as: Demadex     TAKE these medications   ALPRAZolam 1 MG tablet Commonly known as: XANAX Take 1 tablet (1 mg total) by mouth 2 (two) times daily as needed for anxiety.   diphenoxylate-atropine 2.5-0.025 MG tablet Commonly known as: LOMOTIL Take 2 tablets 4 times per day.   DULoxetine 60 MG capsule Commonly known as: CYMBALTA Take 1 capsule (60 mg total) by mouth daily.   famotidine 20 MG tablet Commonly known as: PEPCID Take 1 tablet (20 mg total) by mouth daily.   feeding supplement (GLUCERNA SHAKE) Liqd Take 237 mLs by mouth 3 (three) times daily between meals.   folic acid 1 MG tablet Commonly known as: FOLVITE Take 1 tablet (1 mg total) by mouth daily.   furosemide 40 MG tablet Commonly known as: LASIX Take 1 tablet (40 mg total) by mouth daily. Start taking on: April 01, 2020   glipiZIDE XL 5 MG 24 hr tablet Generic drug: glipiZIDE Take 5 mg by mouth daily.   hydrocortisone 2.5 % lotion Apply topically as needed.   KEYTRUDA IV Inject into the vein.   lactulose 10 GM/15ML solution Commonly known as: CHRONULAC Take 30 mLs (20 g total) by mouth at bedtime as needed for mild  constipation.   lidocaine-prilocaine cream Commonly known as: EMLA APPLY A QUARTER SIZE AMOUNT TO THE AFFECTED AREA 1 HOUR PRIOR TO COMING TO CHEMOTHERAPY. COVER WITH A PLASTIC WRAP.   losartan 25 MG tablet Commonly known as: COZAAR Take 1 tablet (25 mg total) by mouth daily. Start taking on: April 01, 2020   magnesium oxide 400 (241.3 Mg) MG tablet Commonly known as: MAG-OX Take 1 tablet (400 mg total) by mouth 2 (two) times daily.   metFORMIN 500 MG tablet Commonly known as: GLUCOPHAGE Take 500 mg by mouth 2 (two) times daily.   metoprolol succinate 25 MG 24 hr tablet Commonly known as: TOPROL-XL Take 1 tablet (25 mg total) by mouth 2 (two) times daily. What changed: when to take this   ondansetron 4 MG tablet Commonly known as: ZOFRAN Take 1 tablet (4 mg total) by mouth every 8 (eight) hours as needed for nausea or vomiting.   oxyCODONE-acetaminophen 10-325 MG tablet Commonly known as: PERCOCET Take 1 tablet by mouth every 8 (eight) hours as needed (severe pain). What changed:   when to take this  reasons to take this   potassium chloride 10 MEQ tablet Commonly known as: KLOR-CON Take 2 tablets (20 mEq total) by mouth daily. What changed: how much to  take   predniSONE 20 MG tablet Commonly known as: DELTASONE Take 2.5 tablets (50 mg total) by mouth daily with breakfast.   prochlorperazine 10 MG tablet Commonly known as: COMPAZINE Take 1 tablet (10 mg total) by mouth every 6 (six) hours as needed (Nausea or vomiting).   simvastatin 20 MG tablet Commonly known as: ZOCOR Take 20 mg by mouth every evening.   spironolactone 25 MG tablet Commonly known as: ALDACTONE Take 0.5 tablets (12.5 mg total) by mouth daily. Start taking on: April 01, 2020      No Known Allergies  Contact information for follow-up providers    Celene Squibb, MD. Schedule an appointment as soon as possible for a visit in 2 week(s).   Specialty: Internal Medicine Why: after  discharge from SNF  Contact information: [place] [facility] [address] [phone]        Fay Records, MD .   Specialty: Cardiology Contact information: [address] [place] [address] [phone]            Contact information for after-discharge care    [facility] .   Service: Skilled Nursing Contact information: [address] [place] [phone]                  The results of significant diagnostics from this hospitalization (including imaging, microbiology, ancillary and laboratory) are listed below for reference.    Significant Diagnostic Studies: CT Head Wo Contrast  Result Date: 03/25/2020 CLINICAL DATA:  Altered mental status. EXAM: CT HEAD WITHOUT CONTRAST TECHNIQUE: Contiguous axial images were obtained from the base of the skull through the vertex without intravenous contrast. COMPARISON:  March 17, 2020 FINDINGS: Brain: There is mild cerebral atrophy with widening of the extra-axial spaces and ventricular dilatation. There are areas of decreased attenuation within the white matter tracts of the supratentorial brain, consistent with microvascular disease changes. Stable focal white matter low attenuation is noted within the posterior aspect of the right parietal lobe. Vascular: No hyperdense vessel or unexpected calcification. Skull: Normal. Negative for fracture or focal lesion. Sinuses/Orbits: No acute finding. Other: None. IMPRESSION: 1. Generalized cerebral atrophy. 2. No acute intracranial abnormality. 3. Stable findings consistent with a known small right parietal lobe metastatic lesion. Electronically Signed   By: Virgina Norfolk M.D.   On: 03/25/2020 01:16   CT Head Wo Contrast  Result Date: 03/17/2020 CLINICAL DATA:  Lower extremity edema for several weeks, altered level of consciousness, personal history of lung cancer with brain metastases EXAM: CT  HEAD WITHOUT CONTRAST TECHNIQUE: Contiguous axial images were obtained from the base of the skull through the vertex without intravenous contrast. COMPARISON:  12/23/2019 FINDINGS: Brain: Hypodensity within the right parietal lobe consistent with no new intracranial metastasis. Overall the appearance is stable since prior MRI. No signs of acute infarct or hemorrhage. Chronic small vessel ischemic changes are seen throughout the periventricular white matter. The lateral ventricles and midline structures are otherwise unremarkable. No acute extra-axial fluid collections. Vascular: No hyperdense vessel or unexpected calcification. Skull: Normal. Negative for fracture or focal lesion. Sinuses/Orbits: No acute finding. Other: None. IMPRESSION: 1. Hypodensity right parietal lobe consistent with known intracranial metastasis. No significant change since prior studies allowing for differences in modality and technique. 2. Chronic small vessel ischemic changes. No acute infarct or hemorrhage. Electronically Signed   By: Randa Ngo M.D.   On: 03/17/2020 18:45   CT  Chest W Contrast  Result Date: 03/07/2020 CLINICAL DATA:  Non-small-cell lung cancer.  Restaging. EXAM: CT CHEST WITH CONTRAST TECHNIQUE: Multidetector CT imaging of the chest was performed during intravenous contrast administration. CONTRAST:  81mL OMNIPAQUE IOHEXOL 300 MG/ML  SOLN COMPARISON:  12/29/2019 FINDINGS: Cardiovascular: The heart size is normal. No substantial pericardial effusion. Coronary artery calcification is evident. Atherosclerotic calcification is noted in the wall of the thoracic aorta. Ascending thoracic aorta measures 4 cm diameter. Left Port-A-Cath tip is positioned in the proximal SVC. Mediastinum/Nodes: 14 mm right thyroid nodule is stable. Tiny scattered mediastinal lymph nodes are similar to prior. 12 mm short axis right hilar node measured previously is 11 mm today. Small subcarinal node measured previously is stable. No left  axillary adenopathy. The esophagus has normal imaging features. There is no axillary lymphadenopathy. Lungs/Pleura: Posterior right upper lobe pulmonary mass measures 3.3 x 1.4 cm today, decreased from 4.0 x 2.1 cm previously. Stable 6 mm subpleural nodule anterior right hemithorax. Additional scattered tiny pulmonary nodules are similar. No new suspicious pulmonary nodule or mass. Chronic underlying interstitial changes again noted. No pleural effusion. Upper Abdomen: 2.6 x 1.7 cm left adrenal nodule was 2.8 x 1.6 cm previously. Musculoskeletal: No worrisome lytic or sclerotic osseous abnormality. Advanced degenerative changes noted left shoulder, incompletely visualized. IMPRESSION: 1. Interval decrease in size of the posterior right upper lobe pulmonary mass. 2. Stable to slight decrease in size of the left adrenal nodule. 3. Stable 14 mm right thyroid nodule. Not clinically significant; no follow-up imaging recommended (ref: J Am Coll Radiol. 2015 Feb;12(2): 143-50). 4. Aortic Atherosclerosis (ICD10-I70.0). Electronically Signed   By: Misty Stanley M.D.   On: 03/07/2020 14:25   CT Angio Chest PE W/Cm &/Or Wo Cm  Result Date: 03/25/2020 CLINICAL DATA:  Shortness of breath. EXAM: CT ANGIOGRAPHY CHEST WITH CONTRAST TECHNIQUE: Multidetector CT imaging of the chest was performed using the standard protocol during bolus administration of intravenous contrast. Multiplanar CT image reconstructions and MIPs were obtained to evaluate the vascular anatomy. CONTRAST:  112mL OMNIPAQUE IOHEXOL 350 MG/ML SOLN COMPARISON:  March 07, 2020 FINDINGS: Cardiovascular: There is moderate to marked severity calcification of the aortic arch. Satisfactory opacification of the pulmonary arteries to the segmental level. No evidence of pulmonary embolism. Normal heart size with moderate to marked severity coronary artery calcification. No pericardial effusion. Mediastinum/Nodes: Mild paratracheal and AP window lymphadenopathy is  again seen. Small colloid cysts are again seen within the right lobe of the thyroid gland. The trachea and esophagus demonstrate no significant findings. Lungs/Pleura: Mild-to-moderate severity diffuse interstitial thickening is noted bilaterally. This is increased in severity when compared to the prior study. Marked severity multifocal infiltrates are also seen throughout both lungs. This represents a new finding when compared to the prior exam. A 4.1 cm x 1.6 cm posteromedial right upper lobe lung mass is seen. This measures approximately 3.3 cm x 1.4 cm on the prior study. There is no evidence of a pleural effusion or pneumothorax. Upper Abdomen: No acute abnormality. Musculoskeletal: Degenerative changes are noted throughout the thoracic spine. Review of the MIP images confirms the above findings. IMPRESSION: 1. No evidence of pulmonary embolism. 2. Marked severity bilateral multifocal infiltrates. 3. Diffuse interstitial thickening the which may represent sequelae associated with superimposed pulmonary edema. 4. Posteromedial right upper lobe lung mass which is increased in size when compared to the prior exam. This may be, in part, secondary to surrounding areas of adjacent airspace disease. Correlation with follow-up chest plain film is  recommended. 5. Moderate to marked severity coronary artery disease. 6. Aortic atherosclerosis. Aortic Atherosclerosis (ICD10-I70.0). Electronically Signed   By: Virgina Norfolk M.D.   On: 03/25/2020 01:29   DG Chest Port 1 View  Result Date: 03/25/2020 CLINICAL DATA:  Shortness of breath. EXAM: PORTABLE CHEST 1 VIEW COMPARISON:  03/25/2020 CT.  03/24/2020 and prior radiographs FINDINGS: This is a low volume study. The cardiomediastinal silhouette is unchanged. AA LEFT subclavian Port-A-Cath is present with tip overlying the UPPER SVC. Diffuse bilateral airspace opacities does not appear significantly changed from 03/25/2020 CT but increased from 03/24/2020 chest  radiograph. No pneumothorax or definite pleural effusion noted. IMPRESSION: Diffuse bilateral airspace opacities, not significantly changed from 03/25/2020 CT but increased from 03/24/2020 chest radiograph. Electronically Signed   By: Margarette Canada M.D.   On: 03/25/2020 06:50   DG Chest Port 1 View  Result Date: 03/24/2020 CLINICAL DATA:  Suspected sepsis.  Metastatic lung cancer. EXAM: PORTABLE CHEST 1 VIEW COMPARISON:  03/17/2020 FINDINGS: Left Port-A-Cath remains in place, unchanged. Cardiomegaly. Low lung volumes. Right upper lobe nodule/mass seen on prior CT not appreciable on plain film. Chronic interstitial changes. Bibasilar airspace opacities. No effusions or acute bony abnormality. IMPRESSION: Low lung volumes.  Bibasilar atelectasis or infiltrates. Cardiomegaly. Chronic interstitial lung disease. Electronically Signed   By: Rolm Baptise M.D.   On: 03/24/2020 20:18   DG Chest Portable 1 View  Result Date: 03/17/2020 CLINICAL DATA:  74 year old male with shortness of breath, lower extremity swelling. History of non-small cell lung cancer. EXAM: PORTABLE CHEST 1 VIEW COMPARISON:  Restaging CT Chest 03/07/2020 and earlier. FINDINGS: Portable AP upright view at 1652 hours. Stable left chest power port. Stable lung volumes and mediastinal contours. Subtle by radiograph right upper lobe mass as seen recently by CT (arrow). Elsewhere when allowing for portable technique the lungs are clear. No pneumothorax. Severe degeneration at the left shoulder. No acute osseous abnormality identified. Paucity of bowel gas in the upper abdomen. IMPRESSION: No acute cardiopulmonary abnormality. Electronically Signed   By: Genevie Ann M.D.   On: 03/17/2020 16:59   ECHOCARDIOGRAM COMPLETE  Result Date: 03/25/2020    ECHOCARDIOGRAM REPORT   Patient Name:   Darrell Christensen Date of Exam: 03/25/2020 Medical Rec #:  [id number]     Height:       64.0 in Accession #:    [id number]    Weight:       275.0 lb Date of Birth:   Oct 16, 1945     BSA:          2.240 m Patient Age:    71 years      BP:           149/94 mmHg Patient Gender: M             HR:           93 bpm. Exam Location:  [facility] Procedure: 2D Echo Indications:    CHF-Acute Systolic 315.40 / G86.76  History:        Patient has prior history of Echocardiogram examinations, most                 recent 10/07/2003. Risk Factors:Former Smoker, Diabetes and                 Hypertension. Pulmonary Embolus, Brain Metastasisi, Lung Cancer.  Sonographer:    Leavy Cella RDCS (AE) Referring Phys: [id number] OLADAPO ADEFESO IMPRESSIONS  1. Left ventricular ejection fraction, by estimation, is 30 to  35%. The left ventricle has moderately decreased function. The left ventricle demonstrates global hypokinesis. There is moderate left ventricular hypertrophy. Left ventricular diastolic parameters are consistent with Grade I diastolic dysfunction (impaired relaxation).  2. Right ventricular systolic function was not well visualized. The right ventricular size is normal.  3. The pericardial effusion is circumferential. There is no evidence of cardiac tamponade.  4. The mitral valve is normal in structure. No evidence of mitral valve regurgitation. No evidence of mitral stenosis.  5. The aortic valve was not well visualized. Aortic valve regurgitation is trivial. No aortic stenosis is present.  6. The inferior vena cava is normal in size with greater than 50% respiratory variability, suggesting right atrial pressure of 3 mmHg. FINDINGS  Left Ventricle: Left ventricular ejection fraction, by estimation, is 30 to 35%. The left ventricle has moderately decreased function. The left ventricle demonstrates global hypokinesis. The left ventricular internal cavity size was normal in size. There is moderate left ventricular hypertrophy. Left ventricular diastolic parameters are consistent with Grade I diastolic dysfunction (impaired relaxation). Right Ventricle: The right ventricular size is normal. No  increase in right ventricular wall thickness. Right ventricular systolic function was not well visualized. Left Atrium: Left atrial size was normal in size. Right Atrium: Right atrial size was normal in size. Pericardium: Trivial pericardial effusion is present. The pericardial effusion is circumferential. There is no evidence of cardiac tamponade. Mitral Valve: The mitral valve is normal in structure. No evidence of mitral valve regurgitation. No evidence of mitral valve stenosis. Tricuspid Valve: The tricuspid valve is normal in structure. Tricuspid valve regurgitation is not demonstrated. No evidence of tricuspid stenosis. Aortic Valve: The aortic valve was not well visualized. Aortic valve regurgitation is trivial. No aortic stenosis is present. Pulmonic Valve: The pulmonic valve was normal in structure. Pulmonic valve regurgitation is not visualized. No evidence of pulmonic stenosis. Aorta: The aortic root is normal in size and structure. Venous: The inferior vena cava is normal in size with greater than 50% respiratory variability, suggesting right atrial pressure of 3 mmHg. IAS/Shunts: No atrial level shunt detected by color flow Doppler.  LEFT VENTRICLE PLAX 2D LVIDd:         4.89 cm  Diastology LVIDs:         3.97 cm  LV e' lateral:   8.16 cm/s LV PW:         1.56 cm  LV E/e' lateral: 10.7 LV IVS:        1.18 cm LVOT diam:     2.20 cm LVOT Area:     3.80 cm  RIGHT VENTRICLE RV S prime:     15.50 cm/s TAPSE (M-mode): 2.0 cm LEFT ATRIUM           Index       RIGHT ATRIUM           Index LA diam:      3.80 cm 1.70 cm/m  RA Area:     14.00 cm LA Vol (A2C): 57.9 ml 25.85 ml/m RA Volume:   36.70 ml  16.38 ml/m LA Vol (A4C): 59.9 ml 26.74 ml/m   AORTA Ao Root diam: 2.90 cm MITRAL VALVE MV Area (PHT): 5.29 cm    SHUNTS MV Decel Time: 144 msec    Systemic Diam: 2.20 cm MV E velocity: 87.20 cm/s Candee Furbish MD Electronically signed by Candee Furbish MD Signature Date/Time: 03/25/2020/11:18:14 AM    Final      Microbiology: Recent Results (from the past 240  hour(s))  Culture, blood (Routine x 2)     Status: None   Collection Time: 03/24/20  6:57 PM   Specimen: Left Antecubital; Blood  Result Value Ref Range Status   Specimen Description LEFT ANTECUBITAL  Final   Special Requests   Final    BOTTLES DRAWN AEROBIC AND ANAEROBIC Blood Culture results may not be optimal due to an inadequate volume of blood received in culture bottles   Culture   Final    NO GROWTH 5 DAYS Performed at [facility], [address] [place], [place] [address]    Report Status 03/29/2020 FINAL  Final  Culture, blood (Routine x 2)     Status: None   Collection Time: 03/24/20  6:57 PM   Specimen: Right Antecubital; Blood  Result Value Ref Range Status   Specimen Description RIGHT ANTECUBITAL  Final   Special Requests   Final    BOTTLES DRAWN AEROBIC AND ANAEROBIC Blood Culture results may not be optimal due to an inadequate volume of blood received in culture bottles   Culture   Final    NO GROWTH 5 DAYS Performed at [facility], [address] [place], [place] [address]    Report Status 03/29/2020 FINAL  Final  Resp Panel by RT-PCR (Flu A&B, Covid) Nasopharyngeal Swab     Status: None   Collection Time: 03/24/20  6:58 PM   Specimen: Nasopharyngeal Swab; Nasopharyngeal(NP) swabs in vial transport medium  Result Value Ref Range Status   SARS Coronavirus 2 by RT PCR NEGATIVE NEGATIVE Final    Comment: (NOTE) SARS-CoV-2 target nucleic acids are NOT DETECTED.  The SARS-CoV-2 RNA is generally detectable in upper respiratory specimens during the acute phase of infection. The lowest concentration of SARS-CoV-2 viral copies this assay can detect is 138 copies/mL. A negative result does not preclude SARS-Cov-2 infection and should not be used as the sole basis for treatment or other patient management decisions. A negative result may occur with  improper specimen collection/handling, submission of specimen  other than nasopharyngeal swab, presence of viral mutation(s) within the areas targeted by this assay, and inadequate number of viral copies(<138 copies/mL). A negative result must be combined with clinical observations, patient history, and epidemiological information. The expected result is Negative.  Fact Sheet for Patients:  EntrepreneurPulse.com.au  Fact Sheet for Healthcare Providers:  IncredibleEmployment.be  This test is no t yet approved or cleared by the [place] FDA and  has been authorized for detection and/or diagnosis of SARS-CoV-2 by FDA under an Emergency Use Authorization (EUA). This EUA will remain  in effect (meaning this test can be used) for the duration of the COVID-19 declaration under Section 564(b)(1) of the Act, 21 U.S.C.section 360bbb-3(b)(1), unless the authorization is terminated  or revoked sooner.       Influenza A by PCR NEGATIVE NEGATIVE Final   Influenza B by PCR NEGATIVE NEGATIVE Final    Comment: (NOTE) The Xpert Xpress SARS-CoV-2/FLU/RSV plus assay is intended as an aid in the diagnosis of influenza from Nasopharyngeal swab specimens and should not be used as a sole basis for treatment. Nasal washings and aspirates are unacceptable for Xpert Xpress SARS-CoV-2/FLU/RSV testing.  Fact Sheet for Patients: EntrepreneurPulse.com.au  Fact Sheet for Healthcare Providers: IncredibleEmployment.be  This test is not yet approved or cleared by the [place] FDA and has been authorized for detection and/or diagnosis of SARS-CoV-2 by FDA under an Emergency Use Authorization (EUA). This EUA will remain in effect (meaning this test can be used)  for the duration of the COVID-19 declaration under Section 564(b)(1) of the Act, 21 U.S.C. section 360bbb-3(b)(1), unless the authorization is terminated or revoked.  Performed at [facility], [address], [place], [place]  [address]   MRSA PCR Screening     Status: None   Collection Time: 03/25/20  5:12 AM   Specimen: Nasal Mucosa; Nasopharyngeal  Result Value Ref Range Status   MRSA by PCR NEGATIVE NEGATIVE Final    Comment:        The GeneXpert MRSA Assay (FDA approved for NASAL specimens only), is one component of a comprehensive MRSA colonization surveillance program. It is not intended to diagnose MRSA infection nor to guide or monitor treatment for MRSA infections. Performed at [facility], [address], [place], [place] [address]      Labs: Basic Metabolic Panel: Recent Labs  Lab 03/25/20 0419 03/25/20 0419 03/26/20 1139 03/27/20 0419 03/28/20 0451 03/29/20 0550 03/31/20 1055  NA 139   < > 134* 136 136 135 135  K 3.0*   < > 3.0* 3.2* 3.3* 3.6 2.9*  CL 108   < > 98 100 96* 97* 106  CO2 24   < > 27 25 29 28 22   GLUCOSE 86   < > 192* 92 129* 203* 143*  BUN 14   < > 16 20 21  32* 30*  CREATININE 1.32*  1.29*   < > 1.39* 1.32* 1.36* 1.46* 1.09  CALCIUM 6.9*   < > 7.3* 8.2* 8.3* 8.5* 6.6*  MG 1.7  --  1.6* 2.1 1.9 1.8  --   PHOS 2.1*  --   --   --   --   --   --    < > = values in this interval not displayed.   Liver Function Tests: Recent Labs  Lab 03/24/20 1857 03/25/20 0419  AST 18 15  ALT 22 19  ALKPHOS 50 44  BILITOT 0.9 0.7  PROT 5.7* 5.2*  ALBUMIN 2.7* 2.3*    Recent Labs  Lab 03/26/20 1139  AMMONIA 10   CBC: Recent Labs  Lab 03/24/20 1857 03/24/20 1857 03/25/20 0419 03/26/20 1139 03/27/20 0419 03/28/20 0451 03/29/20 0550  WBC 15.8*   < > 12.3* 11.5* 13.3* 14.5* 16.6*  NEUTROABS 11.0*  --   --   --   --   --   --   HGB 11.7*   < > 11.4* 11.4* 12.3* 12.1* 12.1*  HCT 35.7*   < > 34.9* 34.3* 37.8* 36.9* 37.8*  MCV 91.3   < > 93.1 91.0 90.6 91.1 92.2  PLT 238   < > 233 218 260 296 295   < > = values in this interval not displayed.   Cardiac Enzymes: Recent Labs  Lab 03/25/20 0920  CKTOTAL 114   BNP: BNP (last 3 results) Recent Labs     03/17/20 1645 03/25/20 0419 03/29/20 0550  BNP 131.0* 454.0* 187.0*    CBG: Recent Labs  Lab 03/30/20 1130 03/30/20 1721 03/30/20 2147 03/31/20 0756 03/31/20 1138  GLUCAP 163* 173* 225* 79 123*     Signed:  Barton Dubois MD.  [facility] 03/31/2020, 12:00 PM

## 2020-03-31 NOTE — Progress Notes (Signed)
SLP Cancellation Note  Patient Details Name: Darrell Christensen MRN: [id number] DOB: Mar 01, 1946   Cancelled treatment:       Reason Eval/Treat Not Completed: Patient declined participation in [place], no reason specified initially when he refused to allow transport to take him down for procedure. When SLP spoke with Pt later he reports "I've already done that test years ago and I don't want to do it". In Pt's chart, there is no record or note indicating he has ever completed an MBS. SLP reviewed universal aspiration precautions and educated Pt of concerns and possible benefits of completing MBSS however, Pt continued to decline objective assessment. ST will continue efforts,  Nakeda Lebron H. Roddie Mc, CCC-SLP Speech Language Pathologist   Wende Bushy 03/31/2020, 11:24 AM

## 2020-04-05 ENCOUNTER — Other Ambulatory Visit (HOSPITAL_COMMUNITY): Payer: Self-pay

## 2020-04-05 ENCOUNTER — Ambulatory Visit (HOSPITAL_COMMUNITY): Payer: Medicare Other

## 2020-04-05 ENCOUNTER — Inpatient Hospital Stay (HOSPITAL_COMMUNITY): Payer: Medicare Other

## 2020-04-05 ENCOUNTER — Ambulatory Visit (HOSPITAL_COMMUNITY): Payer: Medicare Other | Admitting: Hematology

## 2020-04-05 DIAGNOSIS — C3411 Malignant neoplasm of upper lobe, right bronchus or lung: Secondary | ICD-10-CM

## 2020-04-05 NOTE — Progress Notes (Signed)
Referral to hospice placed to be sent next week per daughter, [name].

## 2020-04-24 ENCOUNTER — Ambulatory Visit: Payer: Self-pay | Admitting: Radiation Oncology

## 2020-04-24 ENCOUNTER — Telehealth (HOSPITAL_COMMUNITY): Payer: Self-pay | Admitting: Surgery

## 2020-04-24 NOTE — Telephone Encounter (Signed)
Pt's daughter left a message stating that her father has tested positive for Covid, so he will have to stay at the [facility] for 10 more days, which will be 05/01/20.  The daughter asked if hospice/palliative could see her father before he is discharged.  I looked in the pt's chart and Lilia Pro Dishmon placed the referral on 04/05/20.  I notified Anastasio Champion, charge RN, of the daughter's request.  Ebony Hail will notify [facility] tomorrow.  I called the pt's daughter back and told her that our nurse navigator Lilia Pro was not here today but that she would be back tomorrow and call the daughter to discuss the hospice/palliative care referral.  Pt's daughter verbalized understanding.

## 2020-04-25 ENCOUNTER — Encounter (HOSPITAL_COMMUNITY): Payer: Self-pay

## 2020-04-25 NOTE — Progress Notes (Signed)
Call received from [place], patient's daughter, who requests a Hospice referral.

## 2020-05-06 DIAGNOSIS — G8929 Other chronic pain: Secondary | ICD-10-CM | POA: Diagnosis not present

## 2020-05-06 DIAGNOSIS — E785 Hyperlipidemia, unspecified: Secondary | ICD-10-CM | POA: Diagnosis not present

## 2020-05-06 DIAGNOSIS — R059 Cough, unspecified: Secondary | ICD-10-CM | POA: Diagnosis not present

## 2020-05-06 DIAGNOSIS — I5021 Acute systolic (congestive) heart failure: Secondary | ICD-10-CM | POA: Diagnosis not present

## 2020-05-06 DIAGNOSIS — E8809 Other disorders of plasma-protein metabolism, not elsewhere classified: Secondary | ICD-10-CM | POA: Diagnosis not present

## 2020-05-06 DIAGNOSIS — W010XXD Fall on same level from slipping, tripping and stumbling without subsequent striking against object, subsequent encounter: Secondary | ICD-10-CM | POA: Diagnosis not present

## 2020-05-06 DIAGNOSIS — J810 Acute pulmonary edema: Secondary | ICD-10-CM | POA: Diagnosis not present

## 2020-05-06 DIAGNOSIS — J9601 Acute respiratory failure with hypoxia: Secondary | ICD-10-CM | POA: Diagnosis not present

## 2020-05-06 DIAGNOSIS — R197 Diarrhea, unspecified: Secondary | ICD-10-CM | POA: Diagnosis not present

## 2020-05-06 DIAGNOSIS — M6281 Muscle weakness (generalized): Secondary | ICD-10-CM | POA: Diagnosis not present

## 2020-05-06 DIAGNOSIS — R0902 Hypoxemia: Secondary | ICD-10-CM | POA: Diagnosis not present

## 2020-05-06 DIAGNOSIS — N189 Chronic kidney disease, unspecified: Secondary | ICD-10-CM | POA: Diagnosis not present

## 2020-05-06 DIAGNOSIS — E1140 Type 2 diabetes mellitus with diabetic neuropathy, unspecified: Secondary | ICD-10-CM | POA: Diagnosis not present

## 2020-05-06 DIAGNOSIS — E119 Type 2 diabetes mellitus without complications: Secondary | ICD-10-CM | POA: Diagnosis not present

## 2020-05-06 DIAGNOSIS — I10 Essential (primary) hypertension: Secondary | ICD-10-CM | POA: Diagnosis not present

## 2020-05-06 DIAGNOSIS — R262 Difficulty in walking, not elsewhere classified: Secondary | ICD-10-CM | POA: Diagnosis not present

## 2020-05-06 DIAGNOSIS — G9340 Encephalopathy, unspecified: Secondary | ICD-10-CM | POA: Diagnosis not present

## 2020-05-06 DIAGNOSIS — R1312 Dysphagia, oropharyngeal phase: Secondary | ICD-10-CM | POA: Diagnosis not present

## 2020-05-06 DIAGNOSIS — K219 Gastro-esophageal reflux disease without esophagitis: Secondary | ICD-10-CM | POA: Diagnosis not present

## 2020-05-06 DIAGNOSIS — E876 Hypokalemia: Secondary | ICD-10-CM | POA: Diagnosis not present

## 2020-05-06 DIAGNOSIS — C7931 Secondary malignant neoplasm of brain: Secondary | ICD-10-CM | POA: Diagnosis not present

## 2020-05-06 DIAGNOSIS — U071 COVID-19: Secondary | ICD-10-CM | POA: Diagnosis not present

## 2020-05-12 DIAGNOSIS — U071 COVID-19: Secondary | ICD-10-CM | POA: Diagnosis not present

## 2020-05-12 DIAGNOSIS — R059 Cough, unspecified: Secondary | ICD-10-CM | POA: Diagnosis not present

## 2020-06-08 DIAGNOSIS — I5022 Chronic systolic (congestive) heart failure: Secondary | ICD-10-CM | POA: Diagnosis not present

## 2020-06-08 DIAGNOSIS — N183 Chronic kidney disease, stage 3 unspecified: Secondary | ICD-10-CM | POA: Diagnosis not present

## 2020-06-08 DIAGNOSIS — Z993 Dependence on wheelchair: Secondary | ICD-10-CM | POA: Diagnosis not present

## 2020-06-08 DIAGNOSIS — R9082 White matter disease, unspecified: Secondary | ICD-10-CM | POA: Diagnosis not present

## 2020-06-08 DIAGNOSIS — Z7409 Other reduced mobility: Secondary | ICD-10-CM | POA: Diagnosis not present

## 2020-06-08 DIAGNOSIS — E11649 Type 2 diabetes mellitus with hypoglycemia without coma: Secondary | ICD-10-CM | POA: Diagnosis not present

## 2020-06-08 DIAGNOSIS — Z923 Personal history of irradiation: Secondary | ICD-10-CM | POA: Diagnosis not present

## 2020-06-08 DIAGNOSIS — C7931 Secondary malignant neoplasm of brain: Secondary | ICD-10-CM | POA: Diagnosis not present

## 2020-06-08 DIAGNOSIS — E1140 Type 2 diabetes mellitus with diabetic neuropathy, unspecified: Secondary | ICD-10-CM | POA: Diagnosis not present

## 2020-06-08 DIAGNOSIS — I130 Hypertensive heart and chronic kidney disease with heart failure and stage 1 through stage 4 chronic kidney disease, or unspecified chronic kidney disease: Secondary | ICD-10-CM | POA: Diagnosis not present

## 2020-06-08 DIAGNOSIS — Z043 Encounter for examination and observation following other accident: Secondary | ICD-10-CM | POA: Diagnosis not present

## 2020-06-08 DIAGNOSIS — Z515 Encounter for palliative care: Secondary | ICD-10-CM | POA: Diagnosis not present

## 2020-06-08 DIAGNOSIS — S0990XA Unspecified injury of head, initial encounter: Secondary | ICD-10-CM | POA: Diagnosis not present

## 2020-06-08 DIAGNOSIS — I6782 Cerebral ischemia: Secondary | ICD-10-CM | POA: Diagnosis not present

## 2020-06-08 DIAGNOSIS — R296 Repeated falls: Secondary | ICD-10-CM | POA: Diagnosis not present

## 2020-06-08 DIAGNOSIS — R059 Cough, unspecified: Secondary | ICD-10-CM | POA: Diagnosis not present

## 2020-06-08 DIAGNOSIS — G9389 Other specified disorders of brain: Secondary | ICD-10-CM | POA: Diagnosis not present

## 2020-06-08 DIAGNOSIS — G319 Degenerative disease of nervous system, unspecified: Secondary | ICD-10-CM | POA: Diagnosis not present

## 2020-06-08 DIAGNOSIS — Z79899 Other long term (current) drug therapy: Secondary | ICD-10-CM | POA: Diagnosis not present

## 2020-06-08 DIAGNOSIS — Z87891 Personal history of nicotine dependence: Secondary | ICD-10-CM | POA: Diagnosis not present

## 2020-06-08 DIAGNOSIS — Z7984 Long term (current) use of oral hypoglycemic drugs: Secondary | ICD-10-CM | POA: Diagnosis not present

## 2020-06-08 DIAGNOSIS — C3491 Malignant neoplasm of unspecified part of right bronchus or lung: Secondary | ICD-10-CM | POA: Diagnosis not present

## 2020-06-08 DIAGNOSIS — E1122 Type 2 diabetes mellitus with diabetic chronic kidney disease: Secondary | ICD-10-CM | POA: Diagnosis not present

## 2020-06-08 DIAGNOSIS — R2243 Localized swelling, mass and lump, lower limb, bilateral: Secondary | ICD-10-CM | POA: Diagnosis not present

## 2020-06-08 DIAGNOSIS — R0989 Other specified symptoms and signs involving the circulatory and respiratory systems: Secondary | ICD-10-CM | POA: Diagnosis not present

## 2020-06-08 DIAGNOSIS — Z20822 Contact with and (suspected) exposure to covid-19: Secondary | ICD-10-CM | POA: Diagnosis not present

## 2020-06-08 DIAGNOSIS — Z9181 History of falling: Secondary | ICD-10-CM | POA: Diagnosis not present

## 2020-06-09 DIAGNOSIS — G9340 Encephalopathy, unspecified: Secondary | ICD-10-CM | POA: Diagnosis not present

## 2020-06-09 DIAGNOSIS — R059 Cough, unspecified: Secondary | ICD-10-CM | POA: Diagnosis not present

## 2020-06-09 DIAGNOSIS — C3490 Malignant neoplasm of unspecified part of unspecified bronchus or lung: Secondary | ICD-10-CM | POA: Diagnosis not present

## 2020-06-09 DIAGNOSIS — N130 Hydronephrosis with ureteropelvic junction obstruction: Secondary | ICD-10-CM | POA: Diagnosis not present

## 2020-06-09 DIAGNOSIS — Z515 Encounter for palliative care: Secondary | ICD-10-CM | POA: Diagnosis not present

## 2020-06-09 DIAGNOSIS — E1122 Type 2 diabetes mellitus with diabetic chronic kidney disease: Secondary | ICD-10-CM | POA: Diagnosis not present

## 2020-06-09 DIAGNOSIS — C7931 Secondary malignant neoplasm of brain: Secondary | ICD-10-CM | POA: Diagnosis not present

## 2020-06-09 DIAGNOSIS — R627 Adult failure to thrive: Secondary | ICD-10-CM | POA: Diagnosis not present

## 2020-06-09 DIAGNOSIS — E1140 Type 2 diabetes mellitus with diabetic neuropathy, unspecified: Secondary | ICD-10-CM | POA: Diagnosis not present

## 2020-06-09 DIAGNOSIS — I130 Hypertensive heart and chronic kidney disease with heart failure and stage 1 through stage 4 chronic kidney disease, or unspecified chronic kidney disease: Secondary | ICD-10-CM | POA: Diagnosis not present

## 2020-06-09 DIAGNOSIS — Z794 Long term (current) use of insulin: Secondary | ICD-10-CM | POA: Diagnosis not present

## 2020-06-09 DIAGNOSIS — I5020 Unspecified systolic (congestive) heart failure: Secondary | ICD-10-CM | POA: Diagnosis not present

## 2020-06-10 DIAGNOSIS — I130 Hypertensive heart and chronic kidney disease with heart failure and stage 1 through stage 4 chronic kidney disease, or unspecified chronic kidney disease: Secondary | ICD-10-CM | POA: Diagnosis not present

## 2020-06-10 DIAGNOSIS — R9082 White matter disease, unspecified: Secondary | ICD-10-CM | POA: Diagnosis not present

## 2020-06-10 DIAGNOSIS — E1122 Type 2 diabetes mellitus with diabetic chronic kidney disease: Secondary | ICD-10-CM | POA: Diagnosis not present

## 2020-06-10 DIAGNOSIS — Z87891 Personal history of nicotine dependence: Secondary | ICD-10-CM | POA: Diagnosis not present

## 2020-06-10 DIAGNOSIS — Z9181 History of falling: Secondary | ICD-10-CM | POA: Diagnosis not present

## 2020-06-10 DIAGNOSIS — I6782 Cerebral ischemia: Secondary | ICD-10-CM | POA: Diagnosis not present

## 2020-06-10 DIAGNOSIS — C3491 Malignant neoplasm of unspecified part of right bronchus or lung: Secondary | ICD-10-CM | POA: Diagnosis not present

## 2020-06-10 DIAGNOSIS — I5020 Unspecified systolic (congestive) heart failure: Secondary | ICD-10-CM | POA: Diagnosis not present

## 2020-06-10 DIAGNOSIS — Z993 Dependence on wheelchair: Secondary | ICD-10-CM | POA: Diagnosis not present

## 2020-06-10 DIAGNOSIS — Z515 Encounter for palliative care: Secondary | ICD-10-CM | POA: Diagnosis not present

## 2020-06-10 DIAGNOSIS — G9340 Encephalopathy, unspecified: Secondary | ICD-10-CM | POA: Diagnosis not present

## 2020-06-10 DIAGNOSIS — I5022 Chronic systolic (congestive) heart failure: Secondary | ICD-10-CM | POA: Diagnosis not present

## 2020-06-10 DIAGNOSIS — N183 Chronic kidney disease, stage 3 unspecified: Secondary | ICD-10-CM | POA: Diagnosis not present

## 2020-06-10 DIAGNOSIS — E1140 Type 2 diabetes mellitus with diabetic neuropathy, unspecified: Secondary | ICD-10-CM | POA: Diagnosis not present

## 2020-06-10 DIAGNOSIS — R059 Cough, unspecified: Secondary | ICD-10-CM | POA: Diagnosis not present

## 2020-06-10 DIAGNOSIS — Z7984 Long term (current) use of oral hypoglycemic drugs: Secondary | ICD-10-CM | POA: Diagnosis not present

## 2020-06-10 DIAGNOSIS — Z923 Personal history of irradiation: Secondary | ICD-10-CM | POA: Diagnosis not present

## 2020-06-10 DIAGNOSIS — Z20822 Contact with and (suspected) exposure to covid-19: Secondary | ICD-10-CM | POA: Diagnosis not present

## 2020-06-10 DIAGNOSIS — Z794 Long term (current) use of insulin: Secondary | ICD-10-CM | POA: Diagnosis not present

## 2020-06-10 DIAGNOSIS — Z79899 Other long term (current) drug therapy: Secondary | ICD-10-CM | POA: Diagnosis not present

## 2020-06-10 DIAGNOSIS — R0989 Other specified symptoms and signs involving the circulatory and respiratory systems: Secondary | ICD-10-CM | POA: Diagnosis not present

## 2020-06-10 DIAGNOSIS — Z7409 Other reduced mobility: Secondary | ICD-10-CM | POA: Diagnosis not present

## 2020-06-10 DIAGNOSIS — C7931 Secondary malignant neoplasm of brain: Secondary | ICD-10-CM | POA: Diagnosis not present

## 2020-06-10 DIAGNOSIS — E11649 Type 2 diabetes mellitus with hypoglycemia without coma: Secondary | ICD-10-CM | POA: Diagnosis not present

## 2020-06-11 DIAGNOSIS — R9082 White matter disease, unspecified: Secondary | ICD-10-CM | POA: Diagnosis not present

## 2020-06-11 DIAGNOSIS — I491 Atrial premature depolarization: Secondary | ICD-10-CM | POA: Diagnosis not present

## 2020-06-11 DIAGNOSIS — I5020 Unspecified systolic (congestive) heart failure: Secondary | ICD-10-CM | POA: Diagnosis not present

## 2020-06-11 DIAGNOSIS — Z7984 Long term (current) use of oral hypoglycemic drugs: Secondary | ICD-10-CM | POA: Diagnosis not present

## 2020-06-11 DIAGNOSIS — Z923 Personal history of irradiation: Secondary | ICD-10-CM | POA: Diagnosis not present

## 2020-06-11 DIAGNOSIS — I6782 Cerebral ischemia: Secondary | ICD-10-CM | POA: Diagnosis not present

## 2020-06-11 DIAGNOSIS — E1140 Type 2 diabetes mellitus with diabetic neuropathy, unspecified: Secondary | ICD-10-CM | POA: Diagnosis not present

## 2020-06-11 DIAGNOSIS — Z7409 Other reduced mobility: Secondary | ICD-10-CM | POA: Diagnosis not present

## 2020-06-11 DIAGNOSIS — Z20822 Contact with and (suspected) exposure to covid-19: Secondary | ICD-10-CM | POA: Diagnosis not present

## 2020-06-11 DIAGNOSIS — Z515 Encounter for palliative care: Secondary | ICD-10-CM | POA: Diagnosis not present

## 2020-06-11 DIAGNOSIS — E1122 Type 2 diabetes mellitus with diabetic chronic kidney disease: Secondary | ICD-10-CM | POA: Diagnosis not present

## 2020-06-11 DIAGNOSIS — C3491 Malignant neoplasm of unspecified part of right bronchus or lung: Secondary | ICD-10-CM | POA: Diagnosis not present

## 2020-06-11 DIAGNOSIS — E11649 Type 2 diabetes mellitus with hypoglycemia without coma: Secondary | ICD-10-CM | POA: Diagnosis not present

## 2020-06-11 DIAGNOSIS — G9340 Encephalopathy, unspecified: Secondary | ICD-10-CM | POA: Diagnosis not present

## 2020-06-11 DIAGNOSIS — Z79899 Other long term (current) drug therapy: Secondary | ICD-10-CM | POA: Diagnosis not present

## 2020-06-11 DIAGNOSIS — Z87891 Personal history of nicotine dependence: Secondary | ICD-10-CM | POA: Diagnosis not present

## 2020-06-11 DIAGNOSIS — I130 Hypertensive heart and chronic kidney disease with heart failure and stage 1 through stage 4 chronic kidney disease, or unspecified chronic kidney disease: Secondary | ICD-10-CM | POA: Diagnosis not present

## 2020-06-11 DIAGNOSIS — Z794 Long term (current) use of insulin: Secondary | ICD-10-CM | POA: Diagnosis not present

## 2020-06-11 DIAGNOSIS — N183 Chronic kidney disease, stage 3 unspecified: Secondary | ICD-10-CM | POA: Diagnosis not present

## 2020-06-11 DIAGNOSIS — R000 Tachycardia, unspecified: Secondary | ICD-10-CM | POA: Diagnosis not present

## 2020-06-11 DIAGNOSIS — R0989 Other specified symptoms and signs involving the circulatory and respiratory systems: Secondary | ICD-10-CM | POA: Diagnosis not present

## 2020-06-11 DIAGNOSIS — I5022 Chronic systolic (congestive) heart failure: Secondary | ICD-10-CM | POA: Diagnosis not present

## 2020-06-11 DIAGNOSIS — Z9181 History of falling: Secondary | ICD-10-CM | POA: Diagnosis not present

## 2020-06-11 DIAGNOSIS — Z993 Dependence on wheelchair: Secondary | ICD-10-CM | POA: Diagnosis not present

## 2020-06-11 DIAGNOSIS — R059 Cough, unspecified: Secondary | ICD-10-CM | POA: Diagnosis not present

## 2020-06-11 DIAGNOSIS — C7931 Secondary malignant neoplasm of brain: Secondary | ICD-10-CM | POA: Diagnosis not present

## 2020-06-12 DIAGNOSIS — E1140 Type 2 diabetes mellitus with diabetic neuropathy, unspecified: Secondary | ICD-10-CM | POA: Diagnosis not present

## 2020-06-12 DIAGNOSIS — G9340 Encephalopathy, unspecified: Secondary | ICD-10-CM | POA: Diagnosis not present

## 2020-06-12 DIAGNOSIS — C719 Malignant neoplasm of brain, unspecified: Secondary | ICD-10-CM | POA: Diagnosis not present

## 2020-06-12 DIAGNOSIS — Z794 Long term (current) use of insulin: Secondary | ICD-10-CM | POA: Diagnosis not present

## 2020-06-13 DIAGNOSIS — N183 Chronic kidney disease, stage 3 unspecified: Secondary | ICD-10-CM | POA: Diagnosis not present

## 2020-06-13 DIAGNOSIS — C3491 Malignant neoplasm of unspecified part of right bronchus or lung: Secondary | ICD-10-CM | POA: Diagnosis not present

## 2020-06-13 DIAGNOSIS — Z7984 Long term (current) use of oral hypoglycemic drugs: Secondary | ICD-10-CM | POA: Diagnosis not present

## 2020-06-13 DIAGNOSIS — R0989 Other specified symptoms and signs involving the circulatory and respiratory systems: Secondary | ICD-10-CM | POA: Diagnosis not present

## 2020-06-13 DIAGNOSIS — R059 Cough, unspecified: Secondary | ICD-10-CM | POA: Diagnosis not present

## 2020-06-13 DIAGNOSIS — Z20822 Contact with and (suspected) exposure to covid-19: Secondary | ICD-10-CM | POA: Diagnosis not present

## 2020-06-13 DIAGNOSIS — E11649 Type 2 diabetes mellitus with hypoglycemia without coma: Secondary | ICD-10-CM | POA: Diagnosis not present

## 2020-06-13 DIAGNOSIS — Z87891 Personal history of nicotine dependence: Secondary | ICD-10-CM | POA: Diagnosis not present

## 2020-06-13 DIAGNOSIS — I5022 Chronic systolic (congestive) heart failure: Secondary | ICD-10-CM | POA: Diagnosis not present

## 2020-06-13 DIAGNOSIS — E1140 Type 2 diabetes mellitus with diabetic neuropathy, unspecified: Secondary | ICD-10-CM | POA: Diagnosis not present

## 2020-06-13 DIAGNOSIS — I6782 Cerebral ischemia: Secondary | ICD-10-CM | POA: Diagnosis not present

## 2020-06-13 DIAGNOSIS — G9340 Encephalopathy, unspecified: Secondary | ICD-10-CM | POA: Diagnosis not present

## 2020-06-13 DIAGNOSIS — Z923 Personal history of irradiation: Secondary | ICD-10-CM | POA: Diagnosis not present

## 2020-06-13 DIAGNOSIS — E1122 Type 2 diabetes mellitus with diabetic chronic kidney disease: Secondary | ICD-10-CM | POA: Diagnosis not present

## 2020-06-13 DIAGNOSIS — Z7409 Other reduced mobility: Secondary | ICD-10-CM | POA: Diagnosis not present

## 2020-06-13 DIAGNOSIS — Z515 Encounter for palliative care: Secondary | ICD-10-CM | POA: Diagnosis not present

## 2020-06-13 DIAGNOSIS — R9082 White matter disease, unspecified: Secondary | ICD-10-CM | POA: Diagnosis not present

## 2020-06-13 DIAGNOSIS — I130 Hypertensive heart and chronic kidney disease with heart failure and stage 1 through stage 4 chronic kidney disease, or unspecified chronic kidney disease: Secondary | ICD-10-CM | POA: Diagnosis not present

## 2020-06-13 DIAGNOSIS — Z79899 Other long term (current) drug therapy: Secondary | ICD-10-CM | POA: Diagnosis not present

## 2020-06-13 DIAGNOSIS — C3490 Malignant neoplasm of unspecified part of unspecified bronchus or lung: Secondary | ICD-10-CM | POA: Diagnosis not present

## 2020-06-13 DIAGNOSIS — Z9181 History of falling: Secondary | ICD-10-CM | POA: Diagnosis not present

## 2020-06-13 DIAGNOSIS — C7931 Secondary malignant neoplasm of brain: Secondary | ICD-10-CM | POA: Diagnosis not present

## 2020-06-13 DIAGNOSIS — Z993 Dependence on wheelchair: Secondary | ICD-10-CM | POA: Diagnosis not present

## 2020-06-14 DIAGNOSIS — Z20822 Contact with and (suspected) exposure to covid-19: Secondary | ICD-10-CM | POA: Diagnosis not present

## 2020-06-14 DIAGNOSIS — Z515 Encounter for palliative care: Secondary | ICD-10-CM | POA: Diagnosis not present

## 2020-06-14 DIAGNOSIS — C3491 Malignant neoplasm of unspecified part of right bronchus or lung: Secondary | ICD-10-CM | POA: Diagnosis not present

## 2020-06-14 DIAGNOSIS — C3490 Malignant neoplasm of unspecified part of unspecified bronchus or lung: Secondary | ICD-10-CM | POA: Diagnosis not present

## 2020-06-14 DIAGNOSIS — C7931 Secondary malignant neoplasm of brain: Secondary | ICD-10-CM | POA: Diagnosis not present

## 2020-06-14 DIAGNOSIS — R0989 Other specified symptoms and signs involving the circulatory and respiratory systems: Secondary | ICD-10-CM | POA: Diagnosis not present

## 2020-06-14 DIAGNOSIS — I130 Hypertensive heart and chronic kidney disease with heart failure and stage 1 through stage 4 chronic kidney disease, or unspecified chronic kidney disease: Secondary | ICD-10-CM | POA: Diagnosis not present

## 2020-06-14 DIAGNOSIS — I5022 Chronic systolic (congestive) heart failure: Secondary | ICD-10-CM | POA: Diagnosis not present

## 2020-06-14 DIAGNOSIS — E11649 Type 2 diabetes mellitus with hypoglycemia without coma: Secondary | ICD-10-CM | POA: Diagnosis not present

## 2020-06-14 DIAGNOSIS — Z87891 Personal history of nicotine dependence: Secondary | ICD-10-CM | POA: Diagnosis not present

## 2020-06-14 DIAGNOSIS — G9340 Encephalopathy, unspecified: Secondary | ICD-10-CM | POA: Diagnosis not present

## 2020-06-14 DIAGNOSIS — R059 Cough, unspecified: Secondary | ICD-10-CM | POA: Diagnosis not present

## 2020-06-14 DIAGNOSIS — Z79899 Other long term (current) drug therapy: Secondary | ICD-10-CM | POA: Diagnosis not present

## 2020-06-14 DIAGNOSIS — Z7409 Other reduced mobility: Secondary | ICD-10-CM | POA: Diagnosis not present

## 2020-06-14 DIAGNOSIS — E1122 Type 2 diabetes mellitus with diabetic chronic kidney disease: Secondary | ICD-10-CM | POA: Diagnosis not present

## 2020-06-14 DIAGNOSIS — R9082 White matter disease, unspecified: Secondary | ICD-10-CM | POA: Diagnosis not present

## 2020-06-14 DIAGNOSIS — N183 Chronic kidney disease, stage 3 unspecified: Secondary | ICD-10-CM | POA: Diagnosis not present

## 2020-06-14 DIAGNOSIS — Z7984 Long term (current) use of oral hypoglycemic drugs: Secondary | ICD-10-CM | POA: Diagnosis not present

## 2020-06-14 DIAGNOSIS — Z9181 History of falling: Secondary | ICD-10-CM | POA: Diagnosis not present

## 2020-06-14 DIAGNOSIS — Z993 Dependence on wheelchair: Secondary | ICD-10-CM | POA: Diagnosis not present

## 2020-06-14 DIAGNOSIS — E1140 Type 2 diabetes mellitus with diabetic neuropathy, unspecified: Secondary | ICD-10-CM | POA: Diagnosis not present

## 2020-06-14 DIAGNOSIS — I6782 Cerebral ischemia: Secondary | ICD-10-CM | POA: Diagnosis not present

## 2020-06-14 DIAGNOSIS — Z923 Personal history of irradiation: Secondary | ICD-10-CM | POA: Diagnosis not present

## 2020-06-15 ENCOUNTER — Ambulatory Visit (INDEPENDENT_AMBULATORY_CARE_PROVIDER_SITE_OTHER): Payer: Medicare Other | Admitting: Gastroenterology

## 2020-06-22 ENCOUNTER — Ambulatory Visit (HOSPITAL_COMMUNITY): Payer: Medicare Other

## 2020-06-26 ENCOUNTER — Ambulatory Visit: Payer: Medicare Other | Admitting: Radiation Oncology

## 2020-06-27 ENCOUNTER — Encounter: Payer: Self-pay | Admitting: Radiation Therapy

## 2020-07-04 DEATH — deceased

## 2022-03-01 IMAGING — CT CT CHEST W/ CM
2 of 4 series · 15 of 36 positions shown, 18 images · IV contrast (Omnipaque or Isovue)
Comparison: 12/29/2019

CLINICAL DATA: Non-small-cell lung cancer.  Restaging.

EXAM:
CT CHEST WITH CONTRAST
TECHNIQUE: Multidetector CT imaging of the chest was performed during
intravenous contrast administration.
CONTRAST:  60mL OMNIPAQUE IOHEXOL 300 MG/ML  SOLN

[Series 2: routine chest with · axial · 0.74mm/px · z∈[-364,-124]mm · 12 of 140 slices shown, 15 images]
[im 10/140  mediastinal]
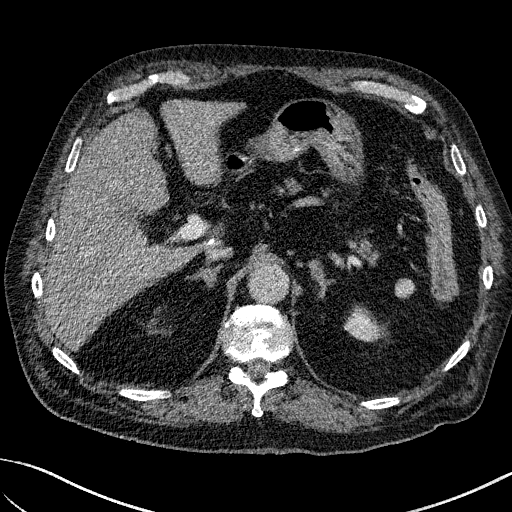
[im 10/140  lung]
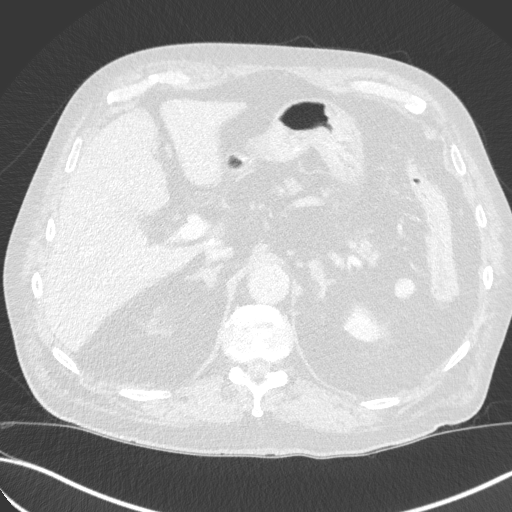
[im 20/140  lung]
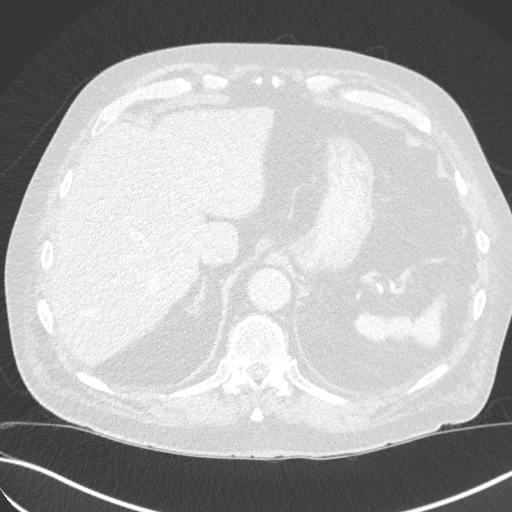
[im 30/140  lung]
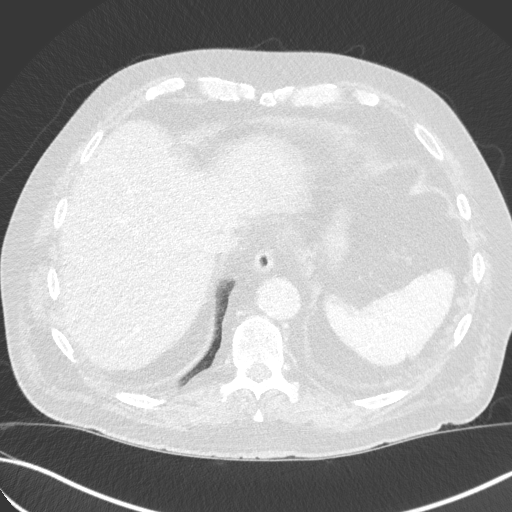
[im 40/140  lung]
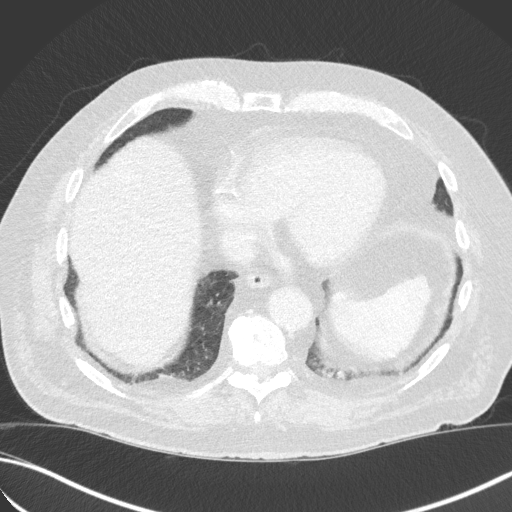
[im 50/140  mediastinal]
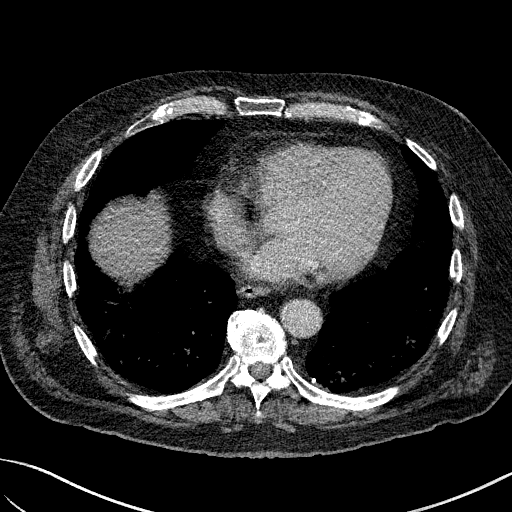
[im 50/140  lung]
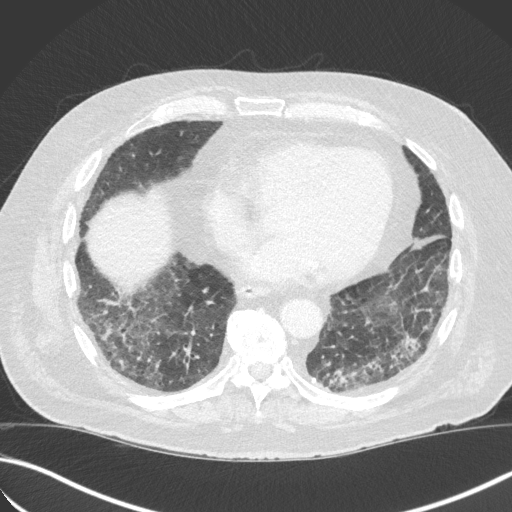
[im 60/140  lung]
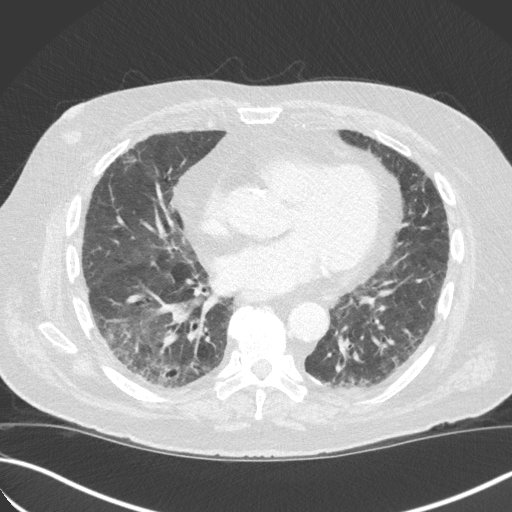
[im 80/140  lung]
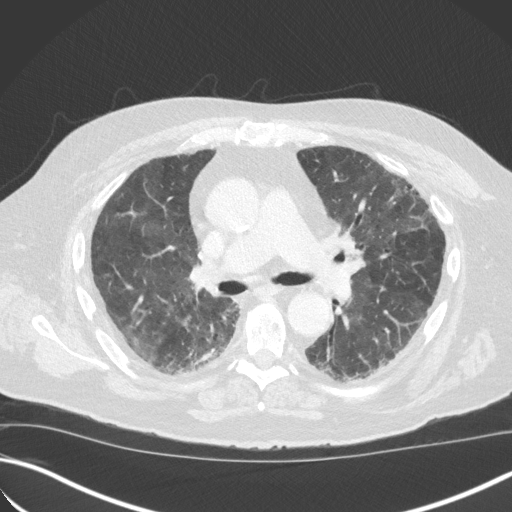
[im 90/140  lung]
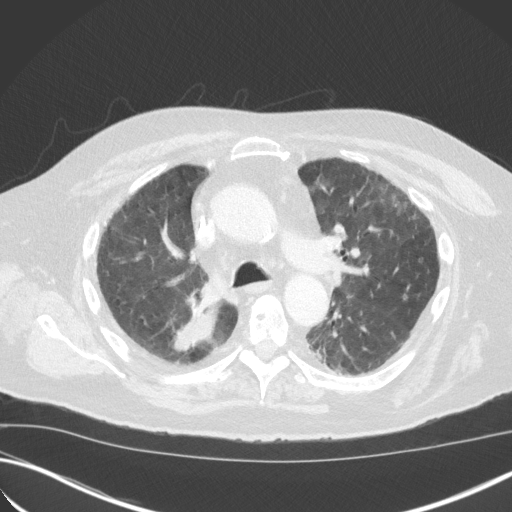
[im 100/140  mediastinal]
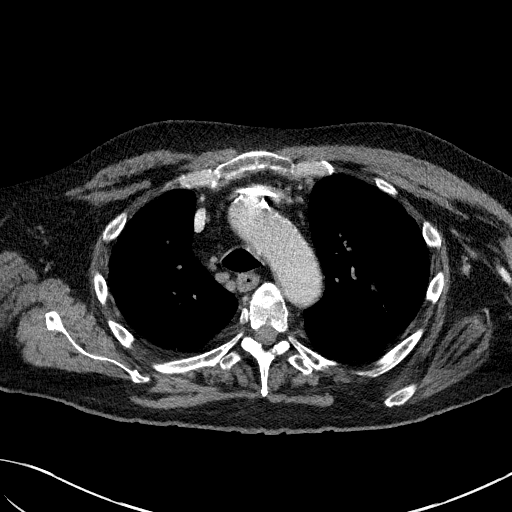
[im 100/140  lung]
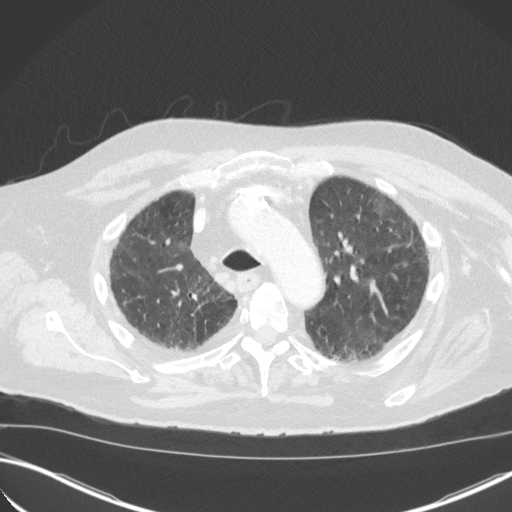
[im 110/140  lung]
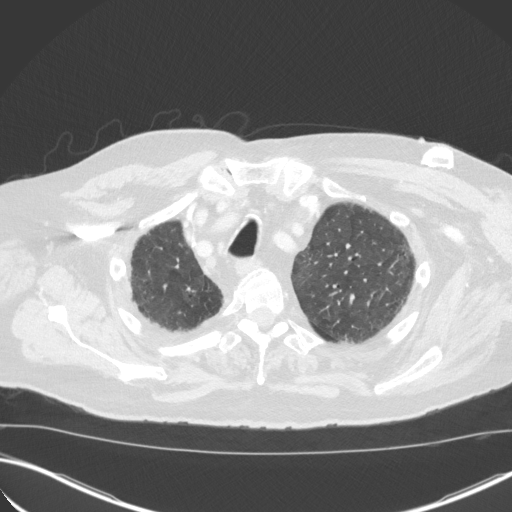
[im 120/140  lung]
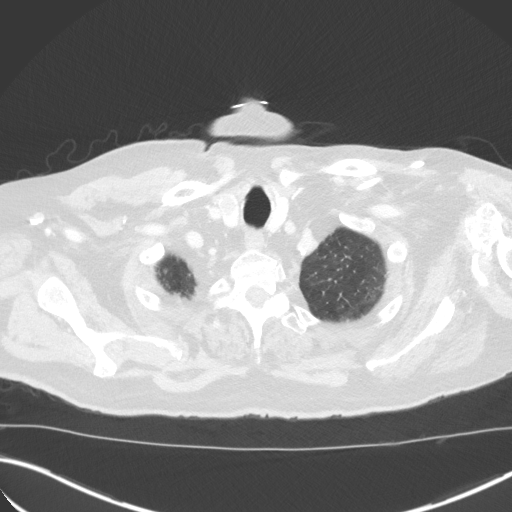
[im 130/140  lung]
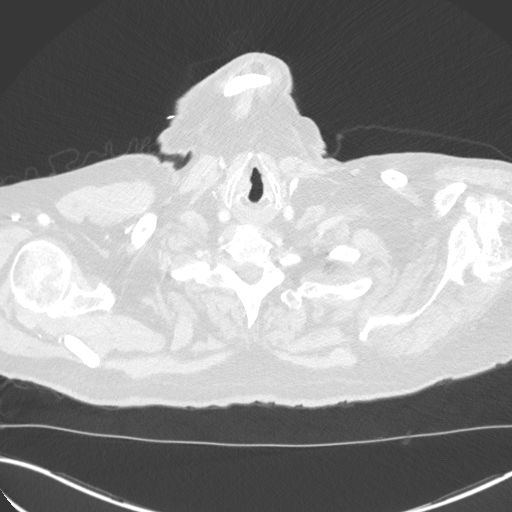

[Series 5: coronal · coronal · 0.55mm/px · 3 of 132 slices shown]
[im 27/132  lung]
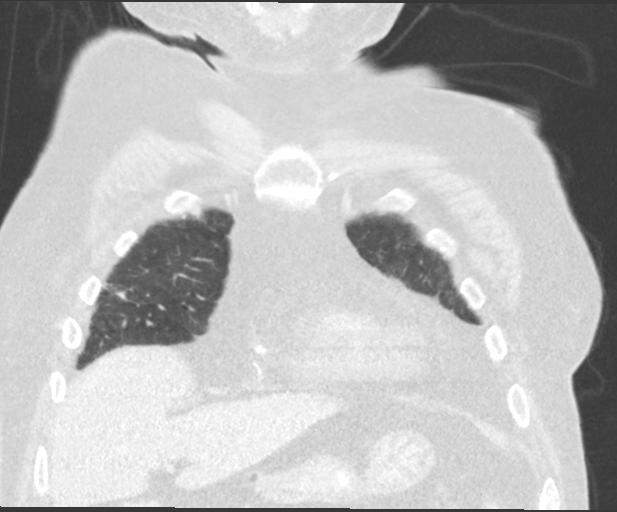
[im 53/132  lung]
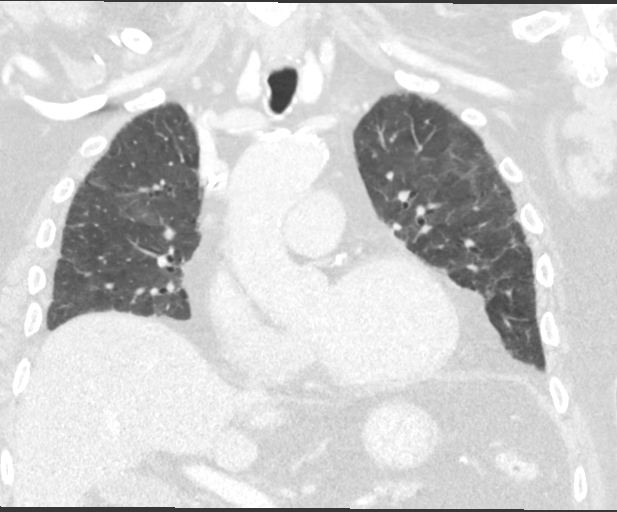
[im 79/132  lung]
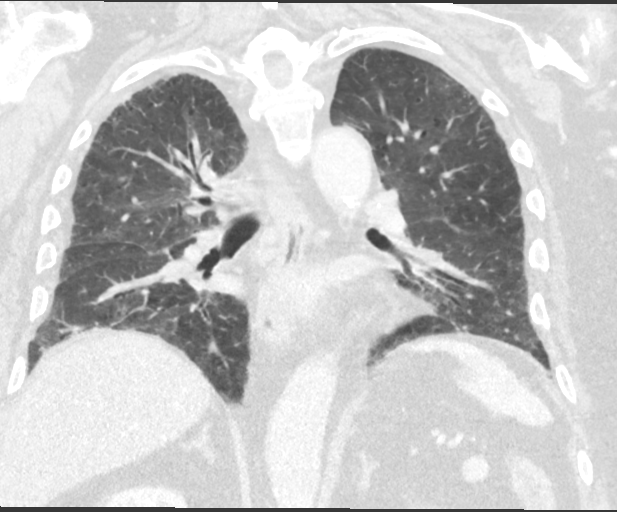

[15 of 36 positions shown; findings below may reference images not displayed]

FINDINGS: Cardiovascular: The heart size is normal. No substantial pericardial
effusion. Coronary artery calcification is evident. Atherosclerotic
calcification is noted in the wall of the thoracic aorta. Ascending
thoracic aorta measures 4 cm diameter. Left Port-A-Cath tip is
positioned in the proximal SVC.

Mediastinum/Nodes: 14 mm right thyroid nodule is stable. Tiny
scattered mediastinal lymph nodes are similar to prior. 12 mm short
axis right hilar node measured previously is 11 mm today. Small
subcarinal node measured previously is stable. No left axillary
adenopathy. The esophagus has normal imaging features. There is no
axillary lymphadenopathy.

Lungs/Pleura: Posterior right upper lobe pulmonary mass measures
x 1.4 cm today, decreased from 4.0 x 2.1 cm previously. Stable 6 mm
subpleural nodule anterior right hemithorax. Additional scattered
tiny pulmonary nodules are similar. No new suspicious pulmonary
nodule or mass. Chronic underlying interstitial changes again noted.
No pleural effusion.

Upper Abdomen: [DATE] x 1.7 cm left adrenal nodule was 2.8 x 1.6 cm
previously.

Musculoskeletal: No worrisome lytic or sclerotic osseous
abnormality. Advanced degenerative changes noted left shoulder,
incompletely visualized.
IMPRESSION: 1. Interval decrease in size of the posterior right upper lobe
pulmonary mass.
2. Stable to slight decrease in size of the left adrenal nodule.
3. Stable 14 mm right thyroid nodule. Not clinically significant; no
follow-up imaging recommended (ref: [HOSPITAL]. 3751
4. Aortic Atherosclerosis (D2CD3-KG5.5).
